# Patient Record
Sex: Female | Born: 1955 | Race: Black or African American | Hispanic: No | Marital: Single | Smoking: Former smoker
Health system: Southern US, Community
[De-identification: ages and names within clinical notes are randomized; demographics above are authoritative.]

## PROBLEM LIST (undated history)

## (undated) DIAGNOSIS — C801 Malignant (primary) neoplasm, unspecified: Secondary | ICD-10-CM

## (undated) DIAGNOSIS — R634 Abnormal weight loss: Secondary | ICD-10-CM

## (undated) DIAGNOSIS — K8689 Other specified diseases of pancreas: Secondary | ICD-10-CM

## (undated) DIAGNOSIS — K219 Gastro-esophageal reflux disease without esophagitis: Secondary | ICD-10-CM

## (undated) DIAGNOSIS — D649 Anemia, unspecified: Secondary | ICD-10-CM

## (undated) DIAGNOSIS — J189 Pneumonia, unspecified organism: Secondary | ICD-10-CM

## (undated) DIAGNOSIS — M810 Age-related osteoporosis without current pathological fracture: Secondary | ICD-10-CM

## (undated) DIAGNOSIS — E559 Vitamin D deficiency, unspecified: Secondary | ICD-10-CM

## (undated) DIAGNOSIS — Z9889 Other specified postprocedural states: Secondary | ICD-10-CM

## (undated) DIAGNOSIS — J449 Chronic obstructive pulmonary disease, unspecified: Secondary | ICD-10-CM

## (undated) DIAGNOSIS — K222 Esophageal obstruction: Secondary | ICD-10-CM

## (undated) DIAGNOSIS — I10 Essential (primary) hypertension: Secondary | ICD-10-CM

## (undated) DIAGNOSIS — D379 Neoplasm of uncertain behavior of digestive organ, unspecified: Secondary | ICD-10-CM

## (undated) DIAGNOSIS — M1990 Unspecified osteoarthritis, unspecified site: Secondary | ICD-10-CM

## (undated) DIAGNOSIS — R112 Nausea with vomiting, unspecified: Secondary | ICD-10-CM

## (undated) DIAGNOSIS — Z808 Family history of malignant neoplasm of other organs or systems: Secondary | ICD-10-CM

## (undated) HISTORY — DX: Anemia, unspecified: D64.9

## (undated) HISTORY — DX: Vitamin D deficiency, unspecified: E55.9

## (undated) HISTORY — PX: BREAST LUMPECTOMY: SHX2

## (undated) HISTORY — DX: Neoplasm of uncertain behavior of digestive organ, unspecified: D37.9

## (undated) HISTORY — PX: ABDOMINAL HYSTERECTOMY: SHX81

## (undated) HISTORY — DX: Other specified diseases of pancreas: K86.89

## (undated) HISTORY — DX: Age-related osteoporosis without current pathological fracture: M81.0

## (undated) HISTORY — DX: Esophageal obstruction: K22.2

## (undated) HISTORY — DX: Gastro-esophageal reflux disease without esophagitis: K21.9

## (undated) HISTORY — DX: Family history of malignant neoplasm of other organs or systems: Z80.8

## (undated) HISTORY — PX: LUNG SURGERY: SHX703

## (undated) HISTORY — DX: Malignant (primary) neoplasm, unspecified: C80.1

---

## 2011-07-16 DIAGNOSIS — C801 Malignant (primary) neoplasm, unspecified: Secondary | ICD-10-CM

## 2011-07-16 HISTORY — DX: Malignant (primary) neoplasm, unspecified: C80.1

## 2011-10-07 ENCOUNTER — Encounter (HOSPITAL_BASED_OUTPATIENT_CLINIC_OR_DEPARTMENT_OTHER): Payer: Self-pay | Admitting: *Deleted

## 2011-10-07 ENCOUNTER — Emergency Department (HOSPITAL_BASED_OUTPATIENT_CLINIC_OR_DEPARTMENT_OTHER)
Admission: EM | Admit: 2011-10-07 | Discharge: 2011-10-07 | Discharge status: Against Medical Advice | Payer: Medicaid Other | Attending: Emergency Medicine | Admitting: Emergency Medicine

## 2011-10-07 ENCOUNTER — Other Ambulatory Visit: Payer: Self-pay

## 2011-10-07 ENCOUNTER — Emergency Department (INDEPENDENT_AMBULATORY_CARE_PROVIDER_SITE_OTHER): Payer: Medicaid Other

## 2011-10-07 DIAGNOSIS — R059 Cough, unspecified: Secondary | ICD-10-CM | POA: Insufficient documentation

## 2011-10-07 DIAGNOSIS — Z043 Encounter for examination and observation following other accident: Secondary | ICD-10-CM

## 2011-10-07 DIAGNOSIS — R531 Weakness: Secondary | ICD-10-CM

## 2011-10-07 DIAGNOSIS — R05 Cough: Secondary | ICD-10-CM | POA: Insufficient documentation

## 2011-10-07 DIAGNOSIS — J449 Chronic obstructive pulmonary disease, unspecified: Secondary | ICD-10-CM | POA: Insufficient documentation

## 2011-10-07 DIAGNOSIS — R197 Diarrhea, unspecified: Secondary | ICD-10-CM | POA: Insufficient documentation

## 2011-10-07 DIAGNOSIS — I10 Essential (primary) hypertension: Secondary | ICD-10-CM | POA: Insufficient documentation

## 2011-10-07 DIAGNOSIS — J4489 Other specified chronic obstructive pulmonary disease: Secondary | ICD-10-CM | POA: Insufficient documentation

## 2011-10-07 DIAGNOSIS — R5381 Other malaise: Secondary | ICD-10-CM | POA: Insufficient documentation

## 2011-10-07 DIAGNOSIS — W19XXXA Unspecified fall, initial encounter: Secondary | ICD-10-CM

## 2011-10-07 DIAGNOSIS — R0602 Shortness of breath: Secondary | ICD-10-CM | POA: Insufficient documentation

## 2011-10-07 DIAGNOSIS — Z9181 History of falling: Secondary | ICD-10-CM | POA: Insufficient documentation

## 2011-10-07 DIAGNOSIS — R1310 Dysphagia, unspecified: Secondary | ICD-10-CM | POA: Insufficient documentation

## 2011-10-07 DIAGNOSIS — R64 Cachexia: Secondary | ICD-10-CM | POA: Insufficient documentation

## 2011-10-07 DIAGNOSIS — R112 Nausea with vomiting, unspecified: Secondary | ICD-10-CM | POA: Insufficient documentation

## 2011-10-07 DIAGNOSIS — R109 Unspecified abdominal pain: Secondary | ICD-10-CM | POA: Insufficient documentation

## 2011-10-07 DIAGNOSIS — M25559 Pain in unspecified hip: Secondary | ICD-10-CM

## 2011-10-07 DIAGNOSIS — M25519 Pain in unspecified shoulder: Secondary | ICD-10-CM

## 2011-10-07 HISTORY — DX: Pneumonia, unspecified organism: J18.9

## 2011-10-07 HISTORY — DX: Chronic obstructive pulmonary disease, unspecified: J44.9

## 2011-10-07 HISTORY — DX: Unspecified osteoarthritis, unspecified site: M19.90

## 2011-10-07 HISTORY — DX: Essential (primary) hypertension: I10

## 2011-10-07 LAB — COMPREHENSIVE METABOLIC PANEL
ALT: 47 U/L — ABNORMAL HIGH (ref 0–35)
AST: 58 U/L — ABNORMAL HIGH (ref 0–37)
Albumin: 2.9 g/dL — ABNORMAL LOW (ref 3.5–5.2)
Alkaline Phosphatase: 122 U/L — ABNORMAL HIGH (ref 39–117)
BUN: 4 mg/dL — ABNORMAL LOW (ref 6–23)
CO2: 31 mEq/L (ref 19–32)
Calcium: 8.6 mg/dL (ref 8.4–10.5)
Chloride: 86 mEq/L — ABNORMAL LOW (ref 96–112)
Creatinine, Ser: 0.4 mg/dL — ABNORMAL LOW (ref 0.50–1.10)
GFR calc Af Amer: >90 mL/min (ref >90)
GFR calc non Af Amer: >90 mL/min (ref >90)
Glucose, Bld: 89 mg/dL (ref 70–99)
Potassium: 3.8 mEq/L (ref 3.5–5.1)
Sodium: 125 mEq/L — ABNORMAL LOW (ref 135–145)
Total Bilirubin: 0.4 mg/dL (ref 0.3–1.2)
Total Protein: 5.5 g/dL — ABNORMAL LOW (ref 6.0–8.3)

## 2011-10-07 LAB — DIFFERENTIAL
Basophils Absolute: 0 10*3/uL (ref 0.0–0.1)
Basophils Relative: 0 % (ref 0–1)
Eosinophils Absolute: 0 10*3/uL (ref 0.0–0.7)
Eosinophils Relative: 0 % (ref 0–5)
Lymphocytes Relative: 26 % (ref 12–46)
Lymphs Abs: 2.1 10*3/uL (ref 0.7–4.0)
Monocytes Absolute: 0.5 10*3/uL (ref 0.1–1.0)
Monocytes Relative: 6 % (ref 3–12)
Neutro Abs: 5.6 10*3/uL (ref 1.7–7.7)
Neutrophils Relative %: 68 % (ref 43–77)

## 2011-10-07 LAB — URINALYSIS, ROUTINE W REFLEX MICROSCOPIC
Bilirubin Urine: NEGATIVE
Glucose, UA: NEGATIVE mg/dL
Hgb urine dipstick: NEGATIVE
Ketones, ur: NEGATIVE mg/dL
Nitrite: NEGATIVE
Protein, ur: NEGATIVE mg/dL
Specific Gravity, Urine: 1.004 — ABNORMAL LOW (ref 1.005–1.030)
Urobilinogen, UA: 1 mg/dL (ref 0.0–1.0)
pH: 6.5 (ref 5.0–8.0)

## 2011-10-07 LAB — MAGNESIUM: Magnesium: 1.6 mg/dL (ref 1.5–2.5)

## 2011-10-07 LAB — CBC
HCT: 43 % (ref 36.0–46.0)
Hemoglobin: 13.9 g/dL (ref 12.0–15.0)
MCH: 33 pg (ref 26.0–34.0)
MCHC: 32.6 g/dL (ref 30.0–36.0)
MCV: 87.4 fL (ref 78.0–100.0)
Platelets: 190 10*3/uL (ref 150–400)
RBC: 4.21 MIL/uL (ref 3.87–5.11)
RDW: 11.2 % — ABNORMAL LOW (ref 11.5–15.5)
WBC: 8.2 10*3/uL (ref 4.0–10.5)

## 2011-10-07 LAB — URINE MICROSCOPIC-ADD ON

## 2011-10-07 LAB — PHOSPHORUS: Phosphorus: 2.7 mg/dL (ref 2.3–4.6)

## 2011-10-07 LAB — LIPASE, BLOOD: Lipase: 44 U/L (ref 11–59)

## 2011-10-07 MED ORDER — SODIUM CHLORIDE 0.9 % IV SOLN
INTRAVENOUS | Status: DC
  Administered 2011-10-07: 20:00:00 via INTRAVENOUS

## 2011-10-07 MED ORDER — ENSURE PO LIQD: 237.0000 mL | Freq: Two times a day (BID) | ORAL | Status: DC

## 2011-10-07 MED ORDER — ONDANSETRON 4 MG PO TBDP: 4.0000 mg | ORAL_TABLET | Freq: Three times a day (TID) | ORAL | Status: AC | PRN

## 2011-10-07 NOTE — ED Notes (Signed)
Patient transported to X-ray via stretcher 

## 2011-10-07 NOTE — ED Provider Notes (Addendum)
I saw and evaluated the patient, reviewed the resident's note and I agree with the findings and plan.   .Face to face Exam:  General:  Awake HEENT:  Atraumatic Resp:  Normal effort Abd:  Nondistended Neuro:No focal weakness Lymph: No adenopathy    Pt signed out against medical advice  Nelia Shi, MD 10/07/11 2308  Nelia Shi, MD 10/07/11 [phone]

## 2011-10-07 NOTE — ED Notes (Signed)
Spoke to patient at length regarding recommendation for admission. Pt states she does not want to be admitted, despite knowing that her health may continue to decline. Spoke to patient regarding test results and low sodium, and pt states she will work on improving her PO intake. Family is concerned bc they feel like pt will not get better if she goes home, and that the patient does not eat much when she is at home "only a few bites, and then she is full". DR Radford Pax made aware. Pt is alert and oriented.

## 2011-10-07 NOTE — ED Notes (Signed)
Secondary Assessment-  Pt comes to ED with c/o generalized weakness. 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  She is unable to eat anything but soup and has lost an excessive amount of weight.  Pt appears frail and emaciated.  Pt expresses she wants to get better and seeking help.

## 2011-10-07 NOTE — ED Notes (Signed)
Pt unable to void 

## 2011-10-07 NOTE — ED Notes (Signed)
Pt returned from radiology. Family at bedside 

## 2011-10-07 NOTE — ED Provider Notes (Addendum)
History     CSN: [id number]  Arrival date & time 10/07/11  1642   First MD Initiated Contact with Patient 10/07/11 1658      Chief Complaint  Patient presents with  . Weakness    (Consider location/radiation/quality/duration/timing/severity/associated sxs/prior treatment) HPI  Patient is 56 yo lady with PMH of HTN, COPD, arthritis, GERD and esophageal stricture, who presents with weakness, chronic diarrhea and abdominal pain, difficulty swallowing, and falls.   Patient reports that she had acid reflux disease years ago and then developed esophageal stricture. She had three esophageal dilation procedures in the past, but still has difficulty in swallowing. She can only take soup or liquid food in past year. She eats 4 to 5 times of small volume liquid food. She never feels full in her stomach. She states that she will have abdominal pain and diarrhea if she eats more food. She also has nausea and vomiting occasionally.   In the past 6 months, she has been feeling very weak, and can not stand or walk steadily. She has had multiple falls the past. Last fall was 3 weeks ago. She injured her left shoulder and left hip. She still has moderate pain in her left shoulder and hip joint.   Patient has history of COPD, but is not taking any medications. Recently, has cough and SOB. She coughs up thick yellow colored sputum without blood in it.  Denies domestic abuse. I asked this possibility when there is no family member is in the room  Denies fever, chills, headaches,  chest pain, dysuria, urgency, frequency, hematuria.   Past Medical History  Diagnosis Date  . Hypertension   . COPD (chronic obstructive pulmonary disease)   . Arthritis   . Pneumonia     Past Surgical History  Procedure Date  . Abdominal hysterectomy   . Cesarean section     No family history on file.  History  Substance Use Topics  . Smoking status: Current Everyday Smoker -- 0.5 packs/day  . Smokeless tobacco:  Not on file  . Alcohol Use: No    OB History    Grav Para Term Preterm Abortions TAB SAB Ect Mult Living                  Review of Systems  Constitutional: Negative for fever and chills.  HENT: Negative for ear pain, congestion, facial swelling, mouth sores, neck pain and voice change.   Eyes: Negative for photophobia, pain and discharge.  Respiratory: Positive for cough and shortness of breath. Negative for apnea, choking, chest tightness, wheezing and stridor.   Cardiovascular: Negative for chest pain, palpitations and leg swelling.  Gastrointestinal: Positive for nausea, vomiting, abdominal pain and diarrhea. Negative for constipation, blood in stool, abdominal distention and anal bleeding.  Genitourinary: Negative for dysuria, urgency, frequency and hematuria.  Musculoskeletal: Positive for back pain, arthralgias and gait problem. Negative for joint swelling.  Skin: Negative for rash and wound.  Neurological: Positive for weakness. Negative for dizziness, tremors, seizures, syncope, facial asymmetry, numbness and headaches.  Hematological: Negative for adenopathy.  Psychiatric/Behavioral: Negative for hallucinations, behavioral problems, confusion and agitation.    Allergies  Review of patient's allergies indicates no known allergies.  Home Medications   Current Outpatient Rx  Name Route Sig Dispense Refill  . SYMBICORT IN Inhalation Inhale 1 puff into the lungs daily as needed.    Marland Kitchen NEXIUM PO Oral Take 1 tablet by mouth daily.    Marland Kitchen BONIVA PO Oral Take 1 tablet  by mouth daily as needed.      BP 84/64  Pulse 84  Temp(Src) 97.5 F (36.4 C) (Oral)  Resp 16  Wt 46 lb 8 oz (21.092 kg)  SpO2 100%  Physical Exam  General: not in acute distress. Extremely cachexia.  HEENT: PERRL, EOMI, no scleral icterus Cardiac: S1/S2, RRR, No murmurs, gallops or rubs Pulm: Decreased air movement bilaterally, scattered expiratory wheezing bilaterally.  No rales, rhonchi or rubs. Abd:  guarding diffusely, tender diffusely, no rebound pain, no organomegaly, BS present Ext: No rashes or edema, 2+DP/PT pulse bilaterally. Tender over left shoulder and hip joinst. There is limitation of movement to extension in both left shoulder and hip joints. Neuro: alert and oriented X3, cranial nerves II-XII grossly intact, muscle strength 5/5 in all extremeties,  sensation to light touch intact.    Date: 10/07/2011  Rate: 95  Rhythm: normal sinus rhythm  QRS Axis: right  Intervals: normal  ST/T Wave abnormalities: nonspecific ST changes  Conduction Disutrbances:none  Narrative Interpretation:   Old EKG Reviewed: none available  ED Course  Procedures (including critical care time)  Patient's CXR showed mild peribronchial thickening suggesting COPD.  She has negative X-ray for shoulder and hip joints, normal lipase, no leukocytosis. Her blood phosphorus and magnesium levels are normal. Her UA showed moderate leukocytes and few bacteria with negative nitrite. Since patient does not have symptoms for UTI, so will not treat now. Her CMP shows hyponatremia with Na 125. Patient is advised to be admitted to hospital. But patient refused to stay and would like to go home today. Patient is instructed to come back if her symptoms get worse.   Labs Reviewed  CBC - Abnormal; Notable for the following:    RDW 11.2 (*)    All other components within normal limits  COMPREHENSIVE METABOLIC PANEL - Abnormal; Notable for the following:    Sodium 125 (*)    Chloride 86 (*)    BUN 4 (*)    Creatinine, Ser 0.40 (*)    Total Protein 5.5 (*)    Albumin 2.9 (*)    AST 58 (*)    ALT 47 (*)    Alkaline Phosphatase 122 (*)    All other components within normal limits  URINALYSIS, ROUTINE W REFLEX MICROSCOPIC - Abnormal; Notable for the following:    Specific Gravity, Urine 1.004 (*)    Leukocytes, UA MODERATE (*)    All other components within normal limits  URINE MICROSCOPIC-ADD ON - Abnormal; Notable  for the following:    Squamous Epithelial / LPF FEW (*)    Bacteria, UA FEW (*)    All other components within normal limits  PHOSPHORUS  MAGNESIUM  LIPASE, BLOOD  DIFFERENTIAL  DIFFERENTIAL   Dg Hip Complete Left  10/07/2011  *RADIOLOGY REPORT*  Clinical Data: Larey Seat 3 weeks ago.  Pain in the left shoulder and left hip.  No prior injury.  Frequent falls.  LEFT HIP - COMPLETE 2+ VIEW  Comparison: None.  Findings: AP and lateral views of the left hip include AP view of the pelvis.  No evidence for acute fracture or subluxation. Regional bowel gas pattern is nonobstructive.  Surgical clips overlie the right SI joint.  IMPRESSION: No evidence for acute  abnormality.  Original Report Authenticated By: Patterson Hammersmith, M.D.   Dg Shoulder Left  10/07/2011  *RADIOLOGY REPORT*  Clinical Data: Pain.  Fall 3 weeks ago.  Frequent falls.  LEFT SHOULDER - 2+ VIEW  Comparison: None.  Findings: There is no evidence for acute fracture or dislocation. No soft tissue foreign body or gas identified.  Left lung apex is unremarkable in appearance.  IMPRESSION: Negative exam.  Original Report Authenticated By: Patterson Hammersmith, M.D.     No diagnosis found.    MDM        Lorretta Harp, MD 10/07/11 [phone]

## 2011-10-07 NOTE — Discharge Instructions (Signed)
1. Please take Zofran for nausea.  2. If you have worsening of your symptoms or new symptoms arise, please go to the ER immediately if symptoms are severe.  Fatigue Fatigue is a feeling of tiredness, lack of energy, lack of motivation, or feeling tired all the time. Having enough rest, good nutrition, and reducing stress will normally reduce fatigue. Consult your caregiver if it persists. The nature of your fatigue will help your caregiver to find out its cause. The treatment is based on the cause.  CAUSES  There are many causes for fatigue. Most of the time, fatigue can be traced to one or more of your habits or routines. Most causes fit into one or more of three general areas. They are: Lifestyle problems  Sleep disturbances.   Overwork.   Physical exertion.   Unhealthy habits.   Poor eating habits or eating disorders.   Alcohol and/or drug use .   Lack of proper nutrition (malnutrition).  Psychological problems  Stress and/or anxiety problems.   Depression.   Grief.   Boredom.  Medical Problems or Conditions  Anemia.   Pregnancy.   Thyroid gland problems.   Recovery from major surgery.   Continuous pain.   Emphysema or asthma that is not well controlled   Allergic conditions.   Diabetes.   Infections (such as mononucleosis).   Obesity.   Sleep disorders, such as sleep apnea.   Heart failure or other heart-related problems.   Cancer.   Kidney disease.   Liver disease.   Effects of certain medicines such as antihistamines, cough and cold remedies, prescription pain medicines, heart and blood pressure medicines, drugs used for treatment of cancer, and some antidepressants.  SYMPTOMS  The symptoms of fatigue include:   Lack of energy.   Lack of drive (motivation).   Drowsiness.   Feeling of indifference to the surroundings.  DIAGNOSIS  The details of how you feel help guide your caregiver in finding out what is causing the fatigue. You will be  asked about your present and past health condition. It is important to review all medicines that you take, including prescription and non-prescription items. A thorough exam will be done. You will be questioned about your feelings, habits, and normal lifestyle. Your caregiver may suggest blood tests, urine tests, or other tests to look for common medical causes of fatigue.  TREATMENT  Fatigue is treated by correcting the underlying cause. For example, if you have continuous pain or depression, treating these causes will improve how you feel. Similarly, adjusting the dose of certain medicines will help in reducing fatigue.  HOME CARE INSTRUCTIONS   Try to get the required amount of good sleep every night.   Eat a healthy and nutritious diet, and drink enough water throughout the day.   Practice ways of relaxing (including yoga or meditation).   Exercise regularly.   Make plans to change situations that cause stress. Act on those plans so that stresses decrease over time. Keep your work and personal routine reasonable.   Avoid street drugs and minimize use of alcohol.   Start taking a daily multivitamin after consulting your caregiver.  SEEK MEDICAL CARE IF:   You have persistent tiredness, which cannot be accounted for.   You have fever.   You have unintentional weight loss.   You have headaches.   You have disturbed sleep throughout the night.   You are feeling sad.   You have constipation.   You have dry skin.   You  have gained weight.   You are taking any new or different medicines that you suspect are causing fatigue.   You are unable to sleep at night.   You develop any unusual swelling of your legs or other parts of your body.  SEEK IMMEDIATE MEDICAL CARE IF:   You are feeling confused.   Your vision is blurred.   You feel faint or pass out.   You develop severe headache.   You develop severe abdominal, pelvic, or back pain.   You develop chest pain,  shortness of breath, or an irregular or fast heartbeat.   You are unable to pass a normal amount of urine.   You develop abnormal bleeding such as bleeding from the rectum or you vomit blood.   You have thoughts about harming yourself or committing suicide.   You are worried that you might harm someone else.  MAKE SURE YOU:   Understand these instructions.   Will watch your condition.   Will get help right away if you are not doing well or get worse.  Document Released: 04/28/2007 Document Revised: 06/20/2011 Document Reviewed: 04/28/2007 [facility] Patient Information 2012 [place], [place].

## 2011-10-07 NOTE — ED Notes (Signed)
MD at bedside. 

## 2011-10-07 NOTE — ED Notes (Signed)
Weakness diarrhea vomiting. Has been sick for 6 months but she will not go to the doctor.

## 2011-10-07 NOTE — ED Notes (Signed)
Pt signed out AMA. Pt states she understands the risks of no being admitted for further treatment. Pt states she assumes those risks, and will return for worsening symptoms. Pt [facility]

## 2011-10-07 NOTE — ED Notes (Signed)
PO fluids provided. 

## 2011-10-19 ENCOUNTER — Other Ambulatory Visit: Payer: Self-pay

## 2011-10-19 ENCOUNTER — Emergency Department (INDEPENDENT_AMBULATORY_CARE_PROVIDER_SITE_OTHER): Payer: Self-pay

## 2011-10-19 ENCOUNTER — Inpatient Hospital Stay (HOSPITAL_BASED_OUTPATIENT_CLINIC_OR_DEPARTMENT_OTHER)
Admission: EM | Admit: 2011-10-19 | Discharge: 2011-10-22 | DRG: 640 | Disposition: A | Discharge status: Home / Self Care | Payer: Self-pay | Attending: Internal Medicine | Admitting: Internal Medicine

## 2011-10-19 ENCOUNTER — Encounter (HOSPITAL_BASED_OUTPATIENT_CLINIC_OR_DEPARTMENT_OTHER): Payer: Self-pay | Admitting: Emergency Medicine

## 2011-10-19 DIAGNOSIS — K2970 Gastritis, unspecified, without bleeding: Secondary | ICD-10-CM | POA: Diagnosis present

## 2011-10-19 DIAGNOSIS — R5381 Other malaise: Secondary | ICD-10-CM | POA: Diagnosis present

## 2011-10-19 DIAGNOSIS — K2990 Gastroduodenitis, unspecified, without bleeding: Secondary | ICD-10-CM | POA: Diagnosis present

## 2011-10-19 DIAGNOSIS — J4489 Other specified chronic obstructive pulmonary disease: Secondary | ICD-10-CM | POA: Diagnosis present

## 2011-10-19 DIAGNOSIS — R0602 Shortness of breath: Secondary | ICD-10-CM

## 2011-10-19 DIAGNOSIS — J449 Chronic obstructive pulmonary disease, unspecified: Secondary | ICD-10-CM | POA: Diagnosis present

## 2011-10-19 DIAGNOSIS — Z79899 Other long term (current) drug therapy: Secondary | ICD-10-CM

## 2011-10-19 DIAGNOSIS — E46 Unspecified protein-calorie malnutrition: Secondary | ICD-10-CM

## 2011-10-19 DIAGNOSIS — R1310 Dysphagia, unspecified: Secondary | ICD-10-CM | POA: Diagnosis present

## 2011-10-19 DIAGNOSIS — R627 Adult failure to thrive: Secondary | ICD-10-CM

## 2011-10-19 DIAGNOSIS — R531 Weakness: Secondary | ICD-10-CM | POA: Diagnosis present

## 2011-10-19 DIAGNOSIS — R634 Abnormal weight loss: Secondary | ICD-10-CM | POA: Diagnosis present

## 2011-10-19 DIAGNOSIS — Z681 Body mass index (BMI) 19 or less, adult: Secondary | ICD-10-CM

## 2011-10-19 DIAGNOSIS — M7989 Other specified soft tissue disorders: Secondary | ICD-10-CM

## 2011-10-19 DIAGNOSIS — K209 Esophagitis, unspecified without bleeding: Secondary | ICD-10-CM | POA: Diagnosis present

## 2011-10-19 DIAGNOSIS — E876 Hypokalemia: Principal | ICD-10-CM | POA: Diagnosis present

## 2011-10-19 DIAGNOSIS — M25579 Pain in unspecified ankle and joints of unspecified foot: Secondary | ICD-10-CM | POA: Diagnosis present

## 2011-10-19 DIAGNOSIS — E8809 Other disorders of plasma-protein metabolism, not elsewhere classified: Secondary | ICD-10-CM | POA: Diagnosis present

## 2011-10-19 DIAGNOSIS — E43 Unspecified severe protein-calorie malnutrition: Secondary | ICD-10-CM | POA: Diagnosis present

## 2011-10-19 DIAGNOSIS — K000 Anodontia: Secondary | ICD-10-CM | POA: Diagnosis present

## 2011-10-19 DIAGNOSIS — M129 Arthropathy, unspecified: Secondary | ICD-10-CM | POA: Diagnosis present

## 2011-10-19 HISTORY — DX: Abnormal weight loss: R63.4

## 2011-10-19 LAB — URINALYSIS, ROUTINE W REFLEX MICROSCOPIC
Bilirubin Urine: NEGATIVE
Glucose, UA: NEGATIVE mg/dL
Hgb urine dipstick: NEGATIVE
Ketones, ur: NEGATIVE mg/dL
Nitrite: NEGATIVE
Protein, ur: NEGATIVE mg/dL
Specific Gravity, Urine: 1.004 — ABNORMAL LOW (ref 1.005–1.030)
Urobilinogen, UA: 0.2 mg/dL (ref 0.0–1.0)
pH: 6 (ref 5.0–8.0)

## 2011-10-19 LAB — DIFFERENTIAL
Basophils Absolute: 0 10*3/uL (ref 0.0–0.1)
Basophils Relative: 0 % (ref 0–1)
Eosinophils Absolute: 0 10*3/uL (ref 0.0–0.7)
Eosinophils Relative: 0 % (ref 0–5)
Lymphocytes Relative: 27 % (ref 12–46)
Lymphs Abs: 2.3 10*3/uL (ref 0.7–4.0)
Monocytes Absolute: 0.8 10*3/uL (ref 0.1–1.0)
Monocytes Relative: 10 % (ref 3–12)
Neutro Abs: 5.5 10*3/uL (ref 1.7–7.7)
Neutrophils Relative %: 63 % (ref 43–77)

## 2011-10-19 LAB — COMPREHENSIVE METABOLIC PANEL
ALT: 23 U/L (ref 0–35)
AST: 29 U/L (ref 0–37)
Albumin: 2.1 g/dL — ABNORMAL LOW (ref 3.5–5.2)
Alkaline Phosphatase: 109 U/L (ref 39–117)
BUN: <3 mg/dL — ABNORMAL LOW (ref 6–23)
CO2: 36 mEq/L — ABNORMAL HIGH (ref 19–32)
Calcium: 7.6 mg/dL — ABNORMAL LOW (ref 8.4–10.5)
Chloride: 97 mEq/L (ref 96–112)
Creatinine, Ser: 0.4 mg/dL — ABNORMAL LOW (ref 0.50–1.10)
GFR calc Af Amer: >90 mL/min (ref >90)
GFR calc non Af Amer: >90 mL/min (ref >90)
Glucose, Bld: 74 mg/dL (ref 70–99)
Potassium: 2 mEq/L — CL (ref 3.5–5.1)
Sodium: 139 mEq/L (ref 135–145)
Total Bilirubin: 0.5 mg/dL (ref 0.3–1.2)
Total Protein: 4.5 g/dL — ABNORMAL LOW (ref 6.0–8.3)

## 2011-10-19 LAB — URINE MICROSCOPIC-ADD ON

## 2011-10-19 LAB — CBC
HCT: 36.5 % (ref 36.0–46.0)
Hemoglobin: 13.5 g/dL (ref 12.0–15.0)
MCH: 32.5 pg (ref 26.0–34.0)
MCHC: 37 g/dL — ABNORMAL HIGH (ref 30.0–36.0)
MCV: 87.7 fL (ref 78.0–100.0)
Platelets: 230 10*3/uL (ref 150–400)
RBC: 4.16 MIL/uL (ref 3.87–5.11)
RDW: 11.9 % (ref 11.5–15.5)
WBC: 8.7 10*3/uL (ref 4.0–10.5)

## 2011-10-19 LAB — RAPID URINE DRUG SCREEN, HOSP PERFORMED
Amphetamines: NOT DETECTED
Barbiturates: NOT DETECTED
Benzodiazepines: NOT DETECTED
Cocaine: NOT DETECTED
Opiates: NOT DETECTED
Tetrahydrocannabinol: NOT DETECTED

## 2011-10-19 LAB — MAGNESIUM: Magnesium: 1.5 mg/dL (ref 1.5–2.5)

## 2011-10-19 LAB — LIPASE, BLOOD: Lipase: 9 U/L — ABNORMAL LOW (ref 11–59)

## 2011-10-19 MED ORDER — POTASSIUM CHLORIDE 20 MEQ/15ML (10%) PO LIQD
ORAL | Status: AC
  Administered 2011-10-19: 40 meq via ORAL
  Filled 2011-10-19: qty 30

## 2011-10-19 MED ORDER — PANTOPRAZOLE SODIUM 40 MG PO TBEC
40.0000 mg | DELAYED_RELEASE_TABLET | Freq: Every day | ORAL | Status: DC
  Administered 2011-10-19 – 2011-10-20 (×2): 40 mg via ORAL
  Filled 2011-10-19 (×3): qty 1

## 2011-10-19 MED ORDER — ACETAMINOPHEN 650 MG RE SUPP: 650.0000 mg | Freq: Four times a day (QID) | RECTAL | Status: DC | PRN

## 2011-10-19 MED ORDER — POTASSIUM CHLORIDE CRYS ER 20 MEQ PO TBCR
60.0000 meq | EXTENDED_RELEASE_TABLET | Freq: Three times a day (TID) | ORAL | Status: AC
  Administered 2011-10-19 – 2011-10-20 (×2): 60 meq via ORAL
  Filled 2011-10-19: qty 3

## 2011-10-19 MED ORDER — HYDROCODONE-ACETAMINOPHEN 5-325 MG PO TABS
1.0000 | ORAL_TABLET | ORAL | Status: DC | PRN
  Administered 2011-10-19: 2 via ORAL
  Filled 2011-10-19: qty 2

## 2011-10-19 MED ORDER — ENSURE COMPLETE PO LIQD
237.0000 mL | Freq: Three times a day (TID) | ORAL | Status: DC
  Administered 2011-10-19 – 2011-10-21 (×4): 237 mL via ORAL

## 2011-10-19 MED ORDER — ENSURE PO LIQD: 237.0000 mL | Freq: Three times a day (TID) | ORAL | Status: DC

## 2011-10-19 MED ORDER — ACETAMINOPHEN 325 MG PO TABS: 650.0000 mg | ORAL_TABLET | Freq: Four times a day (QID) | ORAL | Status: DC | PRN

## 2011-10-19 MED ORDER — MORPHINE SULFATE 2 MG/ML IJ SOLN
0.5000 mg | INTRAMUSCULAR | Status: DC | PRN
  Administered 2011-10-20: 1 mg via INTRAVENOUS
  Administered 2011-10-20: 0.25 mg via INTRAVENOUS
  Administered 2011-10-20: 1 mg via INTRAVENOUS
  Administered 2011-10-20: 0.5 mg via INTRAVENOUS
  Administered 2011-10-20 – 2011-10-22 (×11): 1 mg via INTRAVENOUS
  Filled 2011-10-19 (×14): qty 1

## 2011-10-19 MED ORDER — SODIUM CHLORIDE 0.9 % IV SOLN
INTRAVENOUS | Status: DC
  Administered 2011-10-19 – 2011-10-21 (×5): via INTRAVENOUS

## 2011-10-19 MED ORDER — ONDANSETRON HCL 4 MG PO TABS: 4.0000 mg | ORAL_TABLET | Freq: Four times a day (QID) | ORAL | Status: DC | PRN

## 2011-10-19 MED ORDER — BUDESONIDE-FORMOTEROL FUMARATE 160-4.5 MCG/ACT IN AERO
1.0000 | INHALATION_SPRAY | Freq: Two times a day (BID) | RESPIRATORY_TRACT | Status: DC
  Administered 2011-10-20 – 2011-10-22 (×3): 1 via RESPIRATORY_TRACT
  Filled 2011-10-19 (×4): qty 6

## 2011-10-19 MED ORDER — POTASSIUM CHLORIDE 10 MEQ/100ML IV SOLN
10.0000 meq | INTRAVENOUS | Status: DC
  Administered 2011-10-19: 10 meq via INTRAVENOUS
  Filled 2011-10-19: qty 100

## 2011-10-19 MED ORDER — SODIUM CHLORIDE 0.9 % IV SOLN: INTRAVENOUS | Status: DC

## 2011-10-19 MED ORDER — POTASSIUM CHLORIDE 20 MEQ/15ML (10%) PO LIQD
40.0000 meq | Freq: Once | ORAL | Status: AC
  Administered 2011-10-19: 40 meq via ORAL

## 2011-10-19 MED ORDER — ONDANSETRON HCL 4 MG/2ML IJ SOLN: 4.0000 mg | Freq: Four times a day (QID) | INTRAMUSCULAR | Status: DC | PRN

## 2011-10-19 MED ORDER — ENOXAPARIN SODIUM 40 MG/0.4ML ~~LOC~~ SOLN
40.0000 mg | SUBCUTANEOUS | Status: DC
  Administered 2011-10-19: 40 mg via SUBCUTANEOUS
  Filled 2011-10-19 (×2): qty 0.4

## 2011-10-19 MED ORDER — POTASSIUM CHLORIDE CRYS ER 20 MEQ PO TBCR
EXTENDED_RELEASE_TABLET | ORAL | Status: AC
  Filled 2011-10-19: qty 3

## 2011-10-19 MED ORDER — POTASSIUM CHLORIDE 20 MEQ PO PACK
40.0000 meq | PACK | Freq: Once | ORAL | Status: DC
  Filled 2011-10-19: qty 2

## 2011-10-19 NOTE — ED Notes (Signed)
[facility] had been called for transfer to [facility]

## 2011-10-19 NOTE — ED Notes (Signed)
Report to Ginger, RN on [facility]

## 2011-10-19 NOTE — ED Notes (Signed)
Pt having some swelling in ankles for a few days.  Pt having some painful ambulation.  Pt continues to have poor appetite.  Pt has unsteady gait.  Noted slight swelling at ankles.  Some dyspnea on exertion.

## 2011-10-19 NOTE — ED Provider Notes (Addendum)
History     CSN: [id number]  Arrival date & time 10/19/11  0930   First MD Initiated Contact with Patient 10/19/11 [phone]      Chief Complaint  Patient presents with  . Leg Swelling  . Shortness of Breath  . Failure To Thrive    HPI The patient presents to the emergency room primarily complaining of foot swelling and tingling. Patient states the symptoms started a few days ago but her family member states she has had this off-and-on for at least several months. Patient states it was worse last night but she has noticed that it improved this morning. The swelling is just mild. The tingling and burning was more severe. Patient also has been having trouble with weight loss over at least the last year. Patient weighs only 57 pounds at this time. She has not been able to eat or drink well she states over the last year. The patient attributes this to having her teeth pulled. The patient has been trying to drink Ensure but she vomits when she drinks that. She has not had any fevers or coughing. She does smoke and intermittently has had shortness of breath. She denies any fevers, chest pain, abdominal pain. She has not been able to see a primary doctor in a while. [facility] this [facility]. Past Medical History  Diagnosis Date  . Hypertension   . COPD (chronic obstructive pulmonary disease)   . Arthritis   . Pneumonia   . Weight loss, unintentional     Past Surgical History  Procedure Date  . Abdominal hysterectomy   . Cesarean section     History reviewed. No pertinent family history.  History  Substance Use Topics  . Smoking status: Current Everyday Smoker -- 0.5 packs/day  . Smokeless tobacco: Not on file  . Alcohol Use: No    OB History    Grav Para Term Preterm Abortions TAB SAB Ect Mult Living                  Review of Systems  Constitutional: Positive for fatigue. Negative for fever.  Cardiovascular: Positive for leg  swelling. Negative for chest pain.  Genitourinary: Negative for dysuria.  Neurological: Positive for weakness. Negative for headaches.  Hematological: Does not bruise/bleed easily.  Psychiatric/Behavioral: Negative for confusion.  All other systems reviewed and are negative.    Allergies  Review of patient's allergies indicates no known allergies.  Home Medications   Current Outpatient Rx  Name Route Sig Dispense Refill  . SYMBICORT IN Inhalation Inhale 1 puff into the lungs daily as needed.    . ENSURE PO LIQD Oral Take 237 mLs by mouth 2 (two) times daily between meals. 237 mL 12  . NEXIUM PO Oral Take 1 tablet by mouth daily.    Marland Kitchen BONIVA PO Oral Take 1 tablet by mouth daily as needed.      BP 113/74  Pulse 90  Temp(Src) 98.3 F (36.8 C) (Oral)  Resp 16  Wt 57 lb 3 oz (25.94 kg)  SpO2 100%  Physical Exam  Nursing note and vitals reviewed. Constitutional: No distress.       Appear significantly malnourished  HENT:  Head: Normocephalic and atraumatic.  Right Ear: External ear normal.  Left Ear: External ear normal.  Eyes: Conjunctivae are normal. Right eye exhibits no discharge. Left eye exhibits no discharge. No scleral icterus.  Neck: Neck supple. No tracheal deviation present.  Cardiovascular: Normal rate, regular rhythm and intact distal pulses.   Pulmonary/Chest: Effort normal and breath sounds normal. No stridor. No respiratory distress. She has no wheezes. She has no rales.  Abdominal: Soft. Bowel sounds are normal. She exhibits no distension. There is no tenderness. There is no rebound and no guarding.  Musculoskeletal: She exhibits no edema and no tenderness.       Mild trace edema bilateral feet, strong dorsalis pedis pulse bilaterally  Neurological: She is alert. She displays atrophy. No sensory deficit. Cranial nerve deficit:  no gross defecits noted. She exhibits normal muscle tone. She displays no seizure activity. Coordination normal.  Skin: Skin is warm  and dry. No rash noted.  Psychiatric: She has a normal mood and affect.    ED Course  Procedures (including critical care time)  Medications  potassium chloride (KLOR-CON) packet 40 mEq (not administered)  potassium chloride 10 mEq in 100 mL IVPB (not administered)    Date: 10/19/2011  Rate: 81  Rhythm: normal sinus rhythm  QRS Axis: right  Intervals: normal  ST/T Wave abnormalities: normal  Conduction Disutrbances:none  Narrative Interpretation:   Old EKG Reviewed: unchanged   Labs Reviewed  CBC - Abnormal; Notable for the following:    MCHC 37.0 (*) CORRECTED FOR COLD AGGLUTININS   All other components within normal limits  URINALYSIS, ROUTINE W REFLEX MICROSCOPIC - Abnormal; Notable for the following:    Specific Gravity, Urine 1.004 (*)    Leukocytes, UA MODERATE (*)    All other components within normal limits  COMPREHENSIVE METABOLIC PANEL - Abnormal; Notable for the following:    Potassium 2.0 (*)    CO2 36 (*)    BUN <3 (*) REPEATED TO VERIFY   Creatinine, Ser 0.40 (*)    Calcium 7.6 (*)    Total Protein 4.5 (*)    Albumin 2.1 (*)    All other components within normal limits  LIPASE, BLOOD - Abnormal; Notable for the following:    Lipase 9 (*)    All other components within normal limits  URINE MICROSCOPIC-ADD ON - Abnormal; Notable for the following:    Squamous Epithelial / LPF MANY (*)    Bacteria, UA MANY (*)    All other components within normal limits  DIFFERENTIAL   Dg Chest 2 View  10/19/2011  *RADIOLOGY REPORT*  Clinical Data: Shortness of breath  CHEST - 2 VIEW  Comparison: 10/07/2011  Findings: Cardiomediastinal silhouette is stable.  Hyperinflation again noted.  No acute infiltrate or pleural effusion.  No pulmonary edema.  Bony thorax is stable.  Bilateral nodular nipple shadow again noted.  IMPRESSION: Stable COPD.  No active disease.  Original Report Authenticated By: Natasha Mead, M.D.     MDM  Patient has had significant weight loss over the  last year.  She has not had good followup with an outpatient primary care Dr. Marlou Porch to the symptoms remain unclear. With her smoking history and certainly concerned about the possibility of some undefined malignancy. Her lab test today shows significant malnutrition with diminished protein levels. She also has significant hypokalemia.  I suspect she might have a chronic metabolic alkalosis possibly related to COPD. The patient has been given IV potassium and oral potassium. I will plan on getting her admitted to the hospital for further evaluation.        Celene Kras, MD 10/19/11 1256  Celene Kras, MD 10/19/11 1318

## 2011-10-19 NOTE — H&P (Signed)
Hospital Admission Note Date: 10/19/2011  Patient name: Claudia Wiley           Medical record number: [id number] Date of birth: 1956-04-27           Age: 56 y.o.   Gender: female    PCP:   No primary provider on file.   Chief Complaint:  Hypokalemia  HPI: Claudia Wiley is a 56 y.o. female with past medical history of esophageal stricture, COPD and arthritis. Patient came in to the hospital complaining about tingling in her legs. Patient initially said she had some swelling in her lower extremity which is gone now, but she continues to have tingling and abnormal sensation in her both legs. She also have generalized weakness apart from that she did not mention any other complaints. Upon initial evaluation in the emergency department patient was found to be severely monitor patient's, patient has lost over 40 pounds in the last year, she has hypoalbuminemia and severe hypokalemia. Patient attributed her weight loss to 2 factors including inability to swallow solids and difficulty with chewing food because he does not have any teeth or dentures. Patient admitted to the hospital for the severe hypokalemia and to further workup the significant weight loss.  Past Medical History: Past Medical History  Diagnosis Date  . Hypertension   . COPD (chronic obstructive pulmonary disease)   . Arthritis   . Pneumonia   . Weight loss, unintentional    Past Surgical History  Procedure Date  . Abdominal hysterectomy   . Cesarean section     Medications: Prior to Admission medications   Medication Sig Start Date End Date Taking? Authorizing Provider  budesonide-formoterol (SYMBICORT) 160-4.5 MCG/ACT inhaler Inhale 1 puff into the lungs daily as needed.   Yes Historical Provider, MD  ENSURE (ENSURE) Take 237 mLs by mouth 2 (two) times daily between meals. 10/07/11 10/06/12 Yes Lorretta Harp, MD  esomeprazole (NEXIUM) 40 MG capsule Take 40 mg by mouth daily before breakfast.   Yes Historical Provider, MD    ibandronate (BONIVA) 150 MG tablet Take 150 mg by mouth every 30 (thirty) days. Take in the morning with a full glass of water, on an empty stomach, and do not take anything else by mouth or lie down for the next 30 min.   Yes Historical Provider, MD    Allergies:  No Known Allergies  Social History:  reports that she has been smoking Cigarettes.  She has a .5 pack-year smoking history. She has never used smokeless tobacco. She reports that she does not drink alcohol or use illicit drugs.  Family History: History reviewed. No pertinent family history.  Review of Systems:  Constitutional: negative for anorexia, fevers and sweats Eyes: negative for irritation, redness and visual disturbance Ears, nose, mouth, throat, and face: negative for earaches, epistaxis, nasal congestion and sore throat Respiratory: negative for cough, dyspnea on exertion, sputum and wheezing Cardiovascular: negative for chest pain, dyspnea, lower extremity edema, orthopnea, palpitations and syncope Gastrointestinal: negative for abdominal pain, constipation, diarrhea, melena, nausea and vomiting Genitourinary:negative for dysuria, frequency and hematuria Hematologic/lymphatic: negative for bleeding, easy bruising and lymphadenopathy Musculoskeletal:negative for arthralgias, muscle weakness and stiff joints Neurological: negative for coordination problems, gait problems, headaches and weakness Endocrine: negative for diabetic symptoms including polydipsia, polyuria and weight loss Allergic/Immunologic: negative for anaphylaxis, hay fever and urticaria  Physical Exam: BP 88/71  Pulse 84  Temp(Src) 98.7 F (37.1 C) (Oral)  Resp 18  Ht 4\' 10"  (1.473 m)  Wt 26.6 kg (  58 lb 10.3 oz)  BMI 12.26 kg/m2  SpO2 100% General appearance: alert, cooperative and no distress , emaciated, cachectic Head: Normocephalic, without obvious abnormality, atraumatic  Eyes: conjunctivae/corneas clear. PERRL, EOM's intact. Fundi  benign.  Nose: Nares normal. Septum midline. Mucosa normal. No drainage or sinus tenderness.  Throat: lips, mucosa, and tongue normal; teeth and gums normal  Neck: Supple, no masses, no cervical lymphadenopathy, no JVD appreciated, no meningeal signs Resp: clear to auscultation bilaterally  Chest wall: no tenderness  Cardio: regular rate and rhythm, S1, S2 normal, no murmur, click, rub or gallop  GI: soft, non-tender; bowel sounds normal; no masses, no organomegaly  Extremities: extremities normal, atraumatic, no cyanosis or edema  Skin: Skin color, texture, turgor normal. No rashes or lesions  Neurologic: Alert and oriented X 3, normal strength and tone. Normal symmetric reflexes. Normal coordination and gait  Labs on Admission:   Basename 10/19/11 1027 10/19/11 1022  NA 139 --  K 2.0* --  CL 97 --  CO2 36* --  GLUCOSE 74 --  BUN <3* --  CREATININE 0.40* --  CALCIUM 7.6* --  MG -- 1.5  PHOS -- --    Basename 10/19/11 1027  AST 29  ALT 23  ALKPHOS 109  BILITOT 0.5  PROT 4.5*  ALBUMIN 2.1*    Basename 10/19/11 1027  LIPASE 9*  AMYLASE --    Basename 10/19/11 1015  WBC 8.7  NEUTROABS 5.5  HGB 13.5  HCT 36.5  MCV 87.7  PLT 230   Radiological Exams on Admission: Dg Chest 2 View  10/19/2011  *RADIOLOGY REPORT*  Clinical Data: Shortness of breath  CHEST - 2 VIEW  Comparison: 10/07/2011  Findings: Cardiomediastinal silhouette is stable.  Hyperinflation again noted.  No acute infiltrate or pleural effusion.  No pulmonary edema.  Bony thorax is stable.  Bilateral nodular nipple shadow again noted.  IMPRESSION: Stable COPD.  No active disease.  Original Report Authenticated By: Natasha Mead, M.D.   Dg Hip Complete Left  10/07/2011  *RADIOLOGY REPORT*  Clinical Data: Larey Seat 3 weeks ago.  Pain in the left shoulder and left hip.  No prior injury.  Frequent falls.  LEFT HIP - COMPLETE 2+ VIEW  Comparison: None.  Findings: AP and lateral views of the left hip include AP view of the  pelvis.  No evidence for acute fracture or subluxation. Regional bowel gas pattern is nonobstructive.  Surgical clips overlie the right SI joint.  IMPRESSION: No evidence for acute  abnormality.  Original Report Authenticated By: Patterson Hammersmith, M.D.   Dg Chest Port 1 View  10/07/2011  *RADIOLOGY REPORT*  Clinical Data: Cough.  PORTABLE CHEST - 1 VIEW  Comparison: None.  Findings: The lungs are hyperaerated; this raises concern for COPD. Mild peribronchial thickening is noted.  There is no evidence of focal opacification, pleural effusion or pneumothorax.  Bilateral nipple shadows are noted.  The cardiomediastinal silhouette is within normal limits.  No acute osseous abnormalities are seen.  IMPRESSION: Mild peribronchial thickening noted; findings likely reflect COPD. No focal consolidation seen.  Original Report Authenticated By: Tonia Ghent, M.D.   Dg Shoulder Left  10/07/2011  *RADIOLOGY REPORT*  Clinical Data: Pain.  Fall 3 weeks ago.  Frequent falls.  LEFT SHOULDER - 2+ VIEW  Comparison: None.  Findings: There is no evidence for acute fracture or dislocation. No soft tissue foreign body or gas identified.  Left lung apex is unremarkable in appearance.  IMPRESSION: Negative exam.  Original Report Authenticated  By: Patterson Hammersmith, M.D.     IMPRESSION: Present on Admission:  .Hypokalemia .Malnutrition .Weight loss .Generalized weakness .Dysphagia .Anodontia  Assessment/Plan  Hypokalemia This is could be secondary to her decreased oral intake, this will be aggressively replaced by IV and oral supplementation. We'll check potassium level in the morning. I'll check also magnesium level.  Weight loss/malnutrition Has significant weight loss, patient used to weigh about 100 pounds since last year she lost about 40 pounds. Patient had problems before with esophageal stricture and she reported balloon dilatations twice. She is taken PPI for GERD. I will obtain esophagogram. And obtain  the records from [facility]. Will check HIV, diabetes and her thyroid function. If these are normal on my check for malabsorption  Generalized weakness This is likely secondary to hypokalemia and malnutrition.  Dysphagia Patient has problems even with soft food, I will obtain esophagogram.  Emmaleigh Longo A 10/19/2011, 5:45 PM

## 2011-10-19 NOTE — ED Notes (Signed)
Attempted to call report to [phone], nurse unavailable , will call me back

## 2011-10-19 NOTE — Progress Notes (Signed)
Claudia Wiley [id number] Admission Data: 10/19/2011 3:45 PM Attending Provider: Antonieta Pert, MD  PCP:No primary provider on file. Consults/ Treatment Team:    Claudia Wiley is a 56 y.o. female patient admitted from ED awake, alert  & orientated  X 3,  No Order, VSS - Blood pressure 88/71, pulse 84, temperature 98.7 F (37.1 C), temperature source Oral, resp. rate 18, height 4\' 10"  (1.473 m), weight 26.6 kg (58 lb 10.3 oz), SpO2 100.00%., no c/o shortness of breath, no c/o chest pain, no distress noted. Tele # [id number] placed and pt is currently running:normal sinus rhythm.   IV site WDL:  upper arm right, condition patent and no redness with a transparent dsg that's clean dry and intact.  Allergies:  No Known Allergies   Past Medical History  Diagnosis Date  . Hypertension   . COPD (chronic obstructive pulmonary disease)   . Arthritis   . Pneumonia   . Weight loss, unintentional     History:  obtained from the patient.   Pt orientation to unit, room and routine. Information packet given to patient/family and safety video watched.  Admission INP armband ID verified with patient/family, and in place. SR up x 2, fall risk assessment complete with Patient and family verbalizing understanding of risks associated with falls. Pt verbalizes an understanding of how to use the call bell and to call for help before getting out of bed.  Skin, clean-dry- intact without evidence of bruising, or skin tears.   No evidence of skin break down noted on exam. no rashes    Will cont to monitor and assist as needed.  Claudia Wiley Consuella Lose, RN 10/19/2011 3:45 PM

## 2011-10-19 NOTE — ED Notes (Signed)
Crackers and peanut butter, apple juice provided per [place] by Dr. Lynelle Doctor

## 2011-10-19 NOTE — ED Notes (Signed)
Per Okey Regal, in lab, green top tube hemolyzed.  Attempted to re-draw, unable to obtain, will have another RN attempt; Lynita Lombard, RN made aware

## 2011-10-19 NOTE — ED Notes (Signed)
Coffee provided per OK by Dr. Lynelle Doctor

## 2011-10-20 ENCOUNTER — Inpatient Hospital Stay (HOSPITAL_COMMUNITY): Payer: Self-pay

## 2011-10-20 LAB — COMPREHENSIVE METABOLIC PANEL
ALT: 19 U/L (ref 0–35)
AST: 27 U/L (ref 0–37)
Albumin: 1.6 g/dL — ABNORMAL LOW (ref 3.5–5.2)
Alkaline Phosphatase: 123 U/L — ABNORMAL HIGH (ref 39–117)
BUN: <3 mg/dL — ABNORMAL LOW (ref 6–23)
CO2: 30 mEq/L (ref 19–32)
Calcium: 7.1 mg/dL — ABNORMAL LOW (ref 8.4–10.5)
Chloride: 107 mEq/L (ref 96–112)
Creatinine, Ser: 0.32 mg/dL — ABNORMAL LOW (ref 0.50–1.10)
GFR calc Af Amer: >90 mL/min (ref >90)
GFR calc non Af Amer: >90 mL/min (ref >90)
Glucose, Bld: 61 mg/dL — ABNORMAL LOW (ref 70–99)
Potassium: 3.4 mEq/L — ABNORMAL LOW (ref 3.5–5.1)
Sodium: 141 mEq/L (ref 135–145)
Total Bilirubin: 0.3 mg/dL (ref 0.3–1.2)
Total Protein: 3.8 g/dL — ABNORMAL LOW (ref 6.0–8.3)

## 2011-10-20 LAB — CEA: CEA: 9.6 ng/mL — ABNORMAL HIGH (ref 0.0–5.0)

## 2011-10-20 LAB — CBC
HCT: 29.8 % — ABNORMAL LOW (ref 36.0–46.0)
Hemoglobin: 10.5 g/dL — ABNORMAL LOW (ref 12.0–15.0)
MCH: 32.8 pg (ref 26.0–34.0)
MCHC: 35.2 g/dL (ref 30.0–36.0)
MCV: 93.1 fL (ref 78.0–100.0)
Platelets: 194 10*3/uL (ref 150–400)
RBC: 3.2 MIL/uL — ABNORMAL LOW (ref 3.87–5.11)
RDW: 12.6 % (ref 11.5–15.5)
WBC: 7.2 10*3/uL (ref 4.0–10.5)

## 2011-10-20 LAB — PROTIME-INR
INR: 1.38 (ref 0.00–1.49)
Prothrombin Time: 17.2 seconds — ABNORMAL HIGH (ref 11.6–15.2)

## 2011-10-20 LAB — HIV ANTIBODY (ROUTINE TESTING W REFLEX): HIV: NONREACTIVE

## 2011-10-20 LAB — MAGNESIUM: Magnesium: 1.4 mg/dL — ABNORMAL LOW (ref 1.5–2.5)

## 2011-10-20 LAB — HEMOGLOBIN A1C
Hgb A1c MFr Bld: 5.3 % (ref <5.7)
Mean Plasma Glucose: 105 mg/dL (ref <117)

## 2011-10-20 LAB — TSH: TSH: 0.924 u[IU]/mL (ref 0.350–4.500)

## 2011-10-20 LAB — PHOSPHORUS
Phosphorus: 1.8 mg/dL — ABNORMAL LOW (ref 2.3–4.6)
Phosphorus: 1.4 mg/dL — ABNORMAL LOW (ref 2.3–4.6)

## 2011-10-20 MED ORDER — POTASSIUM CHLORIDE CRYS ER 20 MEQ PO TBCR
40.0000 meq | EXTENDED_RELEASE_TABLET | Freq: Two times a day (BID) | ORAL | Status: AC
  Administered 2011-10-20: 40 meq via ORAL
  Filled 2011-10-20: qty 2

## 2011-10-20 MED ORDER — ADULT MULTIVITAMIN LIQUID CH
5.0000 mL | Freq: Every day | ORAL | Status: DC
  Administered 2011-10-20 – 2011-10-22 (×3): 5 mL via ORAL
  Filled 2011-10-20 (×3): qty 5

## 2011-10-20 MED ORDER — IOHEXOL 300 MG/ML  SOLN
50.0000 mL | Freq: Once | INTRAMUSCULAR | Status: AC | PRN
  Administered 2011-10-20: 50 mL via INTRAVENOUS

## 2011-10-20 MED ORDER — IOHEXOL 300 MG/ML  SOLN
20.0000 mL | INTRAMUSCULAR | Status: AC
  Administered 2011-10-20: 20 mL via ORAL

## 2011-10-20 MED ORDER — K PHOS MONO-SOD PHOS DI & MONO 155-852-130 MG PO TABS
500.0000 mg | ORAL_TABLET | Freq: Three times a day (TID) | ORAL | Status: DC
  Administered 2011-10-20 (×2): 500 mg via ORAL
  Filled 2011-10-20 (×11): qty 2

## 2011-10-20 MED ORDER — DIPHENHYDRAMINE HCL 50 MG/ML IJ SOLN
25.0000 mg | Freq: Four times a day (QID) | INTRAMUSCULAR | Status: DC | PRN
  Administered 2011-10-20 – 2011-10-22 (×7): 25 mg via INTRAVENOUS
  Filled 2011-10-20 (×7): qty 1

## 2011-10-20 MED ORDER — ENOXAPARIN SODIUM 30 MG/0.3ML ~~LOC~~ SOLN
20.0000 mg | SUBCUTANEOUS | Status: DC
  Administered 2011-10-20 – 2011-10-21 (×2): 20 mg via SUBCUTANEOUS
  Filled 2011-10-20 (×6): qty 0.2

## 2011-10-20 MED ORDER — MAGNESIUM SULFATE 40 MG/ML IJ SOLN
2.0000 g | Freq: Once | INTRAMUSCULAR | Status: AC
  Administered 2011-10-20: 2 g via INTRAVENOUS
  Filled 2011-10-20 (×2): qty 50

## 2011-10-20 NOTE — Progress Notes (Signed)
DAILY PROGRESS NOTE                              GENERAL INTERNAL MEDICINE [facility]  SUBJECTIVE: Feels okay denies any complaints. Weakness is much better.  OBJECTIVE: BP 112/86  Pulse 93  Temp(Src) 98.3 F (36.8 C) (Oral)  Resp 17  Ht 4\' 10"  (1.473 m)  Wt 26.6 kg (58 lb 10.3 oz)  BMI 12.26 kg/m2  SpO2 98% No intake or output data in the 24 hours ending 10/20/11 1125                    Weight change:  Physical Exam: General: Alert and awake oriented x3 not in any acute distress. HEENT: anicteric sclera, pupils equal reactive to light and accommodation CVS: S1-S2 heard, no murmur rubs or gallops Chest: clear to auscultation bilaterally, no wheezing rales or rhonchi Abdomen:  normal bowel sounds, soft, nontender, nondistended, no organomegaly Neuro: Cranial nerves II-XII intact, no focal neurological deficits Extremities: no cyanosis, no clubbing or edema noted bilaterally   Lab Results:  Basename 10/20/11 0434 10/19/11 1027 10/19/11 1022  NA 141 139 --  K 3.4* 2.0* --  CL 107 97 --  CO2 30 36* --  GLUCOSE 61* 74 --  BUN <3* <3* --  CREATININE 0.32* 0.40* --  CALCIUM 7.1* 7.6* --  MG 1.4* -- 1.5  PHOS 1.8* -- --    Basename 10/20/11 0434 10/19/11 1027  AST 27 29  ALT 19 23  ALKPHOS 123* 109  BILITOT 0.3 0.5  PROT 3.8* 4.5*  ALBUMIN 1.6* 2.1*    Basename 10/19/11 1027  LIPASE 9*  AMYLASE --    Basename 10/20/11 0434 10/19/11 1015  WBC 7.2 8.7  NEUTROABS -- 5.5  HGB 10.5* 13.5  HCT 29.8* 36.5  MCV 93.1 87.7  PLT 194 230   Basename 10/19/11 1800  HGBA1C 5.3   No results found for this basename: CHOL:2,HDL:2,LDLCALC:2,TRIG:2,CHOLHDL:2,LDLDIRECT:2 in the last 72 hours  Basename 10/19/11 1800  TSH 0.924  T4TOTAL --  T3FREE --  THYROIDAB --   No results found for this basename: VITAMINB12:2,FOLATE:2,FERRITIN:2,TIBC:2,IRON:2,RETICCTPCT:2 in the last 72 hours  Micro Results: No results found for this or any previous visit (from the past 240  hour(s)).  Studies/Results: Dg Chest 2 View  10/19/2011  *RADIOLOGY REPORT*  Clinical Data: Shortness of breath  CHEST - 2 VIEW  Comparison: 10/07/2011  Findings: Cardiomediastinal silhouette is stable.  Hyperinflation again noted.  No acute infiltrate or pleural effusion.  No pulmonary edema.  Bony thorax is stable.  Bilateral nodular nipple shadow again noted.  IMPRESSION: Stable COPD.  No active disease.  Original Report Authenticated By: Natasha Mead, M.D.   Dg Hip Complete Left  10/07/2011  *RADIOLOGY REPORT*  Clinical Data: Larey Seat 3 weeks ago.  Pain in the left shoulder and left hip.  No prior injury.  Frequent falls.  LEFT HIP - COMPLETE 2+ VIEW  Comparison: None.  Findings: AP and lateral views of the left hip include AP view of the pelvis.  No evidence for acute fracture or subluxation. Regional bowel gas pattern is nonobstructive.  Surgical clips overlie the right SI joint.  IMPRESSION: No evidence for acute  abnormality.  Original Report Authenticated By: Patterson Hammersmith, M.D.   Dg Chest Port 1 View  10/07/2011  *RADIOLOGY REPORT*  Clinical Data: Cough.  PORTABLE CHEST - 1 VIEW  Comparison: None.  Findings: The lungs are  hyperaerated; this raises concern for COPD. Mild peribronchial thickening is noted.  There is no evidence of focal opacification, pleural effusion or pneumothorax.  Bilateral nipple shadows are noted.  The cardiomediastinal silhouette is within normal limits.  No acute osseous abnormalities are seen.  IMPRESSION: Mild peribronchial thickening noted; findings likely reflect COPD. No focal consolidation seen.  Original Report Authenticated By: Tonia Ghent, M.D.   Dg Shoulder Left  10/07/2011  *RADIOLOGY REPORT*  Clinical Data: Pain.  Fall 3 weeks ago.  Frequent falls.  LEFT SHOULDER - 2+ VIEW  Comparison: None.  Findings: There is no evidence for acute fracture or dislocation. No soft tissue foreign body or gas identified.  Left lung apex is unremarkable in appearance.   IMPRESSION: Negative exam.  Original Report Authenticated By: Patterson Hammersmith, M.D.   Medications: Scheduled Meds:   . budesonide-formoterol  1 puff Inhalation BID  . enoxaparin  40 mg Subcutaneous Q24H  . feeding supplement  237 mL Oral TID BM  . magnesium sulfate 1 - 4 g bolus IVPB  2 g Intravenous Once  . pantoprazole  40 mg Oral Daily  . phosphorus  500 mg Oral TID  . potassium chloride  40 mEq Oral Once  . potassium chloride SA      . potassium chloride  40 mEq Oral BID  . potassium chloride  60 mEq Oral Q8H  . DISCONTD: sodium chloride   Intravenous STAT  . DISCONTD: ENSURE  237 mL Oral TID BM  . DISCONTD: potassium chloride  40 mEq Oral Once  . DISCONTD: potassium chloride  10 mEq Intravenous Q1 Hr x 2   Continuous Infusions:   . sodium chloride 100 mL/hr at 10/19/11 2357   PRN Meds:.acetaminophen, acetaminophen, HYDROcodone-acetaminophen, morphine, ondansetron (ZOFRAN) IV, ondansetron  ASSESSMENT & PLAN: Principal Problem:  *Hypokalemia Active Problems:  Malnutrition  Weight loss  Generalized weakness  Dysphagia  Anodontia  Hypokalemia  -This is could be secondary to her decreased oral intake. -This will be aggressively replaced by IV and oral supplementation.  -We'll check potassium level in the morning. I'll check also magnesium level.   Hypomagnesemia/hypophosphatemia -Will replace aggressively. -This electrolytes abnormalities might represent mild form of refeeding syndrome.  Weight loss/malnutrition  Has significant weight loss, patient used to weigh about 100 pounds since last year she lost about 40 pounds. Patient had problems before with esophageal stricture and she reported balloon dilatations twice. She is taken PPI for GERD. I will obtain esophagogram. And obtain the records from [facility]. Will check HIV, diabetes and her thyroid function. If these are normal on my check for malabsorption   Generalized weakness  This is likely  secondary to hypokalemia and malnutrition.   Dysphagia  Patient has problems even with soft food, I will obtain esophagogram.    LOS: 1 day   Liliani Bobo A 10/20/2011, 11:25 AM

## 2011-10-20 NOTE — Progress Notes (Signed)
Requested records from [facility] hospital regarding esophageal stricture and dilatations. [facility] states patient did not have that done there.

## 2011-10-21 ENCOUNTER — Inpatient Hospital Stay (HOSPITAL_COMMUNITY): Payer: Self-pay

## 2011-10-21 LAB — COMPREHENSIVE METABOLIC PANEL
ALT: 17 U/L (ref 0–35)
AST: 26 U/L (ref 0–37)
Albumin: 1.5 g/dL — ABNORMAL LOW (ref 3.5–5.2)
Alkaline Phosphatase: 102 U/L (ref 39–117)
BUN: <3 mg/dL — ABNORMAL LOW (ref 6–23)
CO2: 27 mEq/L (ref 19–32)
Calcium: 6.9 mg/dL — ABNORMAL LOW (ref 8.4–10.5)
Chloride: 102 mEq/L (ref 96–112)
Creatinine, Ser: 0.28 mg/dL — ABNORMAL LOW (ref 0.50–1.10)
GFR calc Af Amer: >90 mL/min (ref >90)
GFR calc non Af Amer: >90 mL/min (ref >90)
Glucose, Bld: 80 mg/dL (ref 70–99)
Potassium: 3 mEq/L — ABNORMAL LOW (ref 3.5–5.1)
Sodium: 135 mEq/L (ref 135–145)
Total Bilirubin: 0.3 mg/dL (ref 0.3–1.2)
Total Protein: 3.5 g/dL — ABNORMAL LOW (ref 6.0–8.3)

## 2011-10-21 LAB — URINE CULTURE
Colony Count: 3000
Culture  Setup Time: 201304070154

## 2011-10-21 LAB — MAGNESIUM: Magnesium: 1.7 mg/dL (ref 1.5–2.5)

## 2011-10-21 LAB — URIC ACID: Uric Acid, Serum: 1.9 mg/dL — ABNORMAL LOW (ref 2.4–7.0)

## 2011-10-21 MED ORDER — BOOST / RESOURCE BREEZE PO LIQD
1.0000 | Freq: Two times a day (BID) | ORAL | Status: DC
  Administered 2011-10-21 – 2011-10-22 (×2): 1 via ORAL

## 2011-10-21 MED ORDER — ENSURE COMPLETE PO LIQD
237.0000 mL | Freq: Every day | ORAL | Status: DC
  Administered 2011-10-22: 237 mL via ORAL

## 2011-10-21 MED ORDER — POTASSIUM CHLORIDE 20 MEQ/15ML (10%) PO LIQD
60.0000 meq | Freq: Three times a day (TID) | ORAL | Status: AC
  Administered 2011-10-21 (×2): 60 meq via ORAL
  Filled 2011-10-21 (×2): qty 45

## 2011-10-21 NOTE — Progress Notes (Signed)
INITIAL ADULT NUTRITION ASSESSMENT Date: 10/21/2011   Time: 9:24 AM Reason for Assessment: Consult, unintentional weight loss  ASSESSMENT: Female 56 y.o.  Dx: Hypokalemia  Hx:  Past Medical History  Diagnosis Date  . Hypertension   . COPD (chronic obstructive pulmonary disease)   . Arthritis   . Pneumonia   . Weight loss, unintentional     Related Meds:     . budesonide-formoterol  1 puff Inhalation BID  . enoxaparin (LOVENOX) injection  20 mg Subcutaneous Q24H  . feeding supplement  237 mL Oral TID BM  . iohexol  20 mL Oral Q1 Hr x 2  . magnesium sulfate 1 - 4 g bolus IVPB  2 g Intravenous Once  . mulitivitamin  5 mL Oral Daily  . pantoprazole  40 mg Oral Daily  . phosphorus  500 mg Oral TID  . potassium chloride  60 mEq Oral TID  . potassium chloride  40 mEq Oral BID  . DISCONTD: enoxaparin  40 mg Subcutaneous Q24H     Ht: 4\' 10"  (147.3 cm)  Wt: 58 lb 10.3 oz (26.6 kg)  Ideal Wt: 43.9 kg % Ideal Wt: 61%  Usual Wt: 100 lbs (45.4 kg) Wt Readings from Last 3 Encounters:  10/19/11 58 lb 10.3 oz (26.6 kg)  10/07/11 46 lb 8 oz (21.092 kg)    % Usual Wt: 58%  Body mass index is 12.26 kg/(m^2). Underweight  Food/Nutrition Related Hx: Pt reports difficulty with swallowing, frequent vomiting and weight loss since her teeth extractions in April of last year.   Labs:  CMP     Component Value Date/Time   NA 135 10/21/2011 0535   K 3.0* 10/21/2011 0535   CL 102 10/21/2011 0535   CO2 27 10/21/2011 0535   GLUCOSE 80 10/21/2011 0535   BUN <3* 10/21/2011 0535   CREATININE 0.28* 10/21/2011 0535   CALCIUM 6.9* 10/21/2011 0535   PROT 3.5* 10/21/2011 0535   ALBUMIN 1.5* 10/21/2011 0535   AST 26 10/21/2011 0535   ALT 17 10/21/2011 0535   ALKPHOS 102 10/21/2011 0535   BILITOT 0.3 10/21/2011 0535   GFRNONAA >90 10/21/2011 0535   GFRAA >90 10/21/2011 0535    Intake/Output Summary (Last 24 hours) at 10/21/11 0928 Last data filed at 10/20/11 1756  Gross per 24 hour  Intake   3405 ml  Output       0 ml  Net   3405 ml     Diet Order: Dysphagia 3 thin  Supplements/Tube Feeding: Ensure Complete, TID  IVF:    sodium chloride Last Rate: 100 mL/hr at 10/21/11 0701    Estimated Nutritional Needs:   Kcal: 1500-1700 Protein: 65-75 gm Fluid:  > 1.5 L  Pt is very underweight. Pt has had 43 lb weight loss in the last year, 43%, severe weight loss. Pt reports eating limited foods, many make her vomit. Eats mostly soups and "freeze pops." Does tolerate some beverages, does not like Ensure very much. Pt has severe visable muscle and fat wasting.Pt meets criteria for severe malnutrition in the context of chronic illness 2/2 to weight loss, energy intake, and muscle/fat wasting.  RD had pt try [employer], pt agreeable to drinking them BID as able. Pt also c/o swallowing problems, recommend SLP evaluation. Pt with difficulty chewing r/t tooth extractions, on D3 diet, pt is tolerating ok at this time.   NUTRITION DIAGNOSIS: -Inadequate oral intake (NI-2.1).  Status: Ongoing  RELATED TO: dysphagia, N/V  AS EVIDENCE BY: weight  loss, 43% in 1 year  MONITORING/EVALUATION(Goals): Goal: PO intake of meals and supplements will promote weight gain Monitor: PO intake, weight, labs, results of esophagogram, HIV/DM/Thyroid tests  EDUCATION NEEDS: -No education needs identified at this time  INTERVENTION: 1. Change Ensure Complete to once daily 2. Add Resource Breeze BID 3. RD will continue to follow  Dietitian [phone]  DOCUMENTATION CODES Per approved criteria  -Severe malnutrition in the context of chronic illness -Underweight    KOWALSKI, Rami Waddle MARIE 10/21/2011, 9:24 AM

## 2011-10-21 NOTE — Progress Notes (Signed)
Patient left ankle swollen, elevated on pillows, heat pack applied, pain medicine administered.  Patient complains of throbbing in ankle, when ambulating there is pain when applying her body weight.  Will continue to monitor.  Macarthur Critchley, RN

## 2011-10-21 NOTE — Progress Notes (Signed)
DAILY PROGRESS NOTE                              [facility]  SUBJECTIVE: Feels okay denies any complaints. Weakness is much better.  OBJECTIVE: BP 94/56  Pulse 81  Temp(Src) 98 F (36.7 C) (Oral)  Resp 18  Ht 4\' 10"  (1.473 m)  Wt 26.6 kg (58 lb 10.3 oz)  BMI 12.26 kg/m2  SpO2 98%  Intake/Output Summary (Last 24 hours) at 10/21/11 1046 Last data filed at 10/20/11 1756  Gross per 24 hour  Intake   3405 ml  Output      0 ml  Net   3405 ml                      Weight change:  Physical Exam: General: Alert and awake oriented x3 not in any acute distress. HEENT: anicteric sclera, pupils equal reactive to light and accommodation CVS: S1-S2 heard, no murmur rubs or gallops Chest: clear to auscultation bilaterally, no wheezing rales or rhonchi Abdomen:  normal bowel sounds, soft, nontender, nondistended, no organomegaly Neuro: Cranial nerves II-XII intact, no focal neurological deficits Extremities: no cyanosis, no clubbing or edema noted bilaterally   Lab Results:  Basename 10/21/11 0535 10/20/11 1242 10/20/11 0434  NA 135 -- 141  K 3.0* -- 3.4*  CL 102 -- 107  CO2 27 -- 30  GLUCOSE 80 -- 61*  BUN <3* -- <3*  CREATININE 0.28* -- 0.32*  CALCIUM 6.9* -- 7.1*  MG 1.7 -- 1.4*  PHOS -- 1.4* 1.8*    Basename 10/21/11 0535 10/20/11 0434  AST 26 27  ALT 17 19  ALKPHOS 102 123*  BILITOT 0.3 0.3  PROT 3.5* 3.8*  ALBUMIN 1.5* 1.6*    Basename 10/19/11 1027  LIPASE 9*  AMYLASE --    Basename 10/20/11 0434 10/19/11 1015  WBC 7.2 8.7  NEUTROABS -- 5.5  HGB 10.5* 13.5  HCT 29.8* 36.5  MCV 93.1 87.7  PLT 194 230    Basename 10/19/11 1800  HGBA1C 5.3   No results found for this basename: CHOL:2,HDL:2,LDLCALC:2,TRIG:2,CHOLHDL:2,LDLDIRECT:2 in the last 72 hours  Basename 10/19/11 1800  TSH 0.924  T4TOTAL --  T3FREE --  THYROIDAB --   No results found for this basename: VITAMINB12:2,FOLATE:2,FERRITIN:2,TIBC:2,IRON:2,RETICCTPCT:2  in the last 72 hours  Micro Results: Recent Results (from the past 240 hour(s))  URINE CULTURE     Status: Normal   Collection Time   10/19/11  8:35 PM      Component Value Range Status Comment   Specimen Description URINE, CLEAN CATCH   Final    Special Requests NONE   Final    Culture  Setup Time [id number]   Final    Colony Count 3,000 COLONIES/ML   Final    Culture INSIGNIFICANT GROWTH   Final    Report Status 10/21/2011 FINAL   Final     Studies/Results: Dg Chest 2 View  10/19/2011  *RADIOLOGY REPORT*  Clinical Data: Shortness of breath  CHEST - 2 VIEW  Comparison: 10/07/2011  Findings: Cardiomediastinal silhouette is stable.  Hyperinflation again noted.  No acute infiltrate or pleural effusion.  No pulmonary edema.  Bony thorax is stable.  Bilateral nodular nipple shadow again noted.  IMPRESSION: Stable COPD.  No active disease.  Original Report Authenticated By: Natasha Mead, M.D.   Dg Hip Complete Left  10/07/2011  *RADIOLOGY REPORT*  Clinical  Data: Larey Seat 3 weeks ago.  Pain in the left shoulder and left hip.  No prior injury.  Frequent falls.  LEFT HIP - COMPLETE 2+ VIEW  Comparison: None.  Findings: AP and lateral views of the left hip include AP view of the pelvis.  No evidence for acute fracture or subluxation. Regional bowel gas pattern is nonobstructive.  Surgical clips overlie the right SI joint.  IMPRESSION: No evidence for acute  abnormality.  Original Report Authenticated By: Patterson Hammersmith, M.D.   Dg Chest Port 1 View  10/07/2011  *RADIOLOGY REPORT*  Clinical Data: Cough.  PORTABLE CHEST - 1 VIEW  Comparison: None.  Findings: The lungs are hyperaerated; this raises concern for COPD. Mild peribronchial thickening is noted.  There is no evidence of focal opacification, pleural effusion or pneumothorax.  Bilateral nipple shadows are noted.  The cardiomediastinal silhouette is within normal limits.  No acute osseous abnormalities are seen.  IMPRESSION: Mild peribronchial  thickening noted; findings likely reflect COPD. No focal consolidation seen.  Original Report Authenticated By: Tonia Ghent, M.D.   Dg Shoulder Left  10/07/2011  *RADIOLOGY REPORT*  Clinical Data: Pain.  Fall 3 weeks ago.  Frequent falls.  LEFT SHOULDER - 2+ VIEW  Comparison: None.  Findings: There is no evidence for acute fracture or dislocation. No soft tissue foreign body or gas identified.  Left lung apex is unremarkable in appearance.  IMPRESSION: Negative exam.  Original Report Authenticated By: Patterson Hammersmith, M.D.   Medications: Scheduled Meds:    . budesonide-formoterol  1 puff Inhalation BID  . enoxaparin (LOVENOX) injection  20 mg Subcutaneous Q24H  . feeding supplement  237 mL Oral Q1200  . feeding supplement  1 Container Oral BID AC  . iohexol  20 mL Oral Q1 Hr x 2  . mulitivitamin  5 mL Oral Daily  . pantoprazole  40 mg Oral Daily  . phosphorus  500 mg Oral TID  . potassium chloride  60 mEq Oral TID  . potassium chloride  40 mEq Oral BID  . DISCONTD: enoxaparin  40 mg Subcutaneous Q24H  . DISCONTD: feeding supplement  237 mL Oral TID BM   Continuous Infusions:    . sodium chloride 100 mL/hr at 10/21/11 0701   PRN Meds:.acetaminophen, acetaminophen, diphenhydrAMINE, HYDROcodone-acetaminophen, iohexol, morphine, ondansetron (ZOFRAN) IV, ondansetron  ASSESSMENT & PLAN: Principal Problem:  *Hypokalemia Active Problems:  Severe protein-calorie malnutrition  Weight loss  Generalized weakness  Dysphagia  Anodontia  Hypokalemia  -This is could be secondary to her decreased oral intake. -This will be aggressively replaced by IV and oral supplementation.  -We'll check potassium level in the morning. I'll check also magnesium level.   Hypomagnesemia/hypophosphatemia -Will replace aggressively. -This electrolytes abnormalities might represent mild form of refeeding syndrome.  Weight loss/severe malnutrition  -Has significant weight loss, patient used to weigh  about 100 pounds since last year she lost about 40 pounds. -Patient had problems before with esophageal stricture and she reported balloon dilatations twice.  -She is taken PPI for GERD. I will obtain esophagogram.  -Patient reported that she had EGD and colonoscopy done in [facility] last year, the hospital contacted and they report no admissions last year.  Left ankle pain  -Looks like an acute gouty arthritis, we'll check uric acid level and treat with pain medications.  Generalized weakness  This is likely secondary to hypokalemia and malnutrition.   Dysphagia  -Patient has problems even with soft food, I will obtain esophagogram. -Esophagram  showed normal swallowing, her swallowing problems likely secondary to anodontia.    LOS: 2 days   Claudia Wiley 10/21/2011, 10:46 AM

## 2011-10-21 NOTE — Progress Notes (Signed)
   CARE MANAGEMENT NOTE 10/21/2011  Patient:  Claudia Wiley, Claudia Wiley   Account Number:  [id number]  Date Initiated:  10/21/2011  Documentation initiated by:  Letha Cape  Subjective/Objective Assessment:   dx hypokalemia  admit- lives with son.  [facility] .     Action/Plan:   Anticipated DC Date:  10/22/2011   Anticipated DC Plan:  HOME/SELF CARE      DC Planning Services  CM consult  VA referrals / transfers      Choice offered to / List presented to:             Status of service:  In process, will continue to follow Medicare Important Message given?   (If response is "NO", the following Medicare IM given date fields will be blank) Date Medicare IM given:   Date Additional Medicare IM given:    Discharge Disposition:    Per UR Regulation:    If discussed at Long Length of Stay Meetings, dates discussed:    Comments:  10/21/11 11:34 Letha Cape RN, BSN [phone] patient lives with son, pta independent, patient [facility] with [facility] on [facility].  Patient will have VA benefits after she sees the [place] MD, but she will need medications filled before then is she does not already have some at home  (Symbicort, Nexium, Bonevia), patient went down to xray before I could find out if she has these meds at home, I will check with her when she comes back.

## 2011-10-22 LAB — COMPREHENSIVE METABOLIC PANEL
ALT: 19 U/L (ref 0–35)
AST: 34 U/L (ref 0–37)
Albumin: 1.5 g/dL — ABNORMAL LOW (ref 3.5–5.2)
Alkaline Phosphatase: 109 U/L (ref 39–117)
BUN: <3 mg/dL — ABNORMAL LOW (ref 6–23)
CO2: 24 mEq/L (ref 19–32)
Calcium: 7.7 mg/dL — ABNORMAL LOW (ref 8.4–10.5)
Chloride: 107 mEq/L (ref 96–112)
Creatinine, Ser: 0.25 mg/dL — ABNORMAL LOW (ref 0.50–1.10)
GFR calc Af Amer: >90 mL/min (ref >90)
GFR calc non Af Amer: >90 mL/min (ref >90)
Glucose, Bld: 87 mg/dL (ref 70–99)
Potassium: 5.2 mEq/L — ABNORMAL HIGH (ref 3.5–5.1)
Sodium: 135 mEq/L (ref 135–145)
Total Bilirubin: 0.3 mg/dL (ref 0.3–1.2)
Total Protein: 3.8 g/dL — ABNORMAL LOW (ref 6.0–8.3)

## 2011-10-22 LAB — PHOSPHORUS: Phosphorus: 2.7 mg/dL (ref 2.3–4.6)

## 2011-10-22 MED ORDER — ESOMEPRAZOLE MAGNESIUM 40 MG PO CPDR: 40.0000 mg | DELAYED_RELEASE_CAPSULE | Freq: Every day | ORAL | Status: DC

## 2011-10-22 NOTE — Discharge Summary (Signed)
HOSPITAL DISCHARGE SUMMARY  Claudia Wiley  MRN: [id number]  DOB:1955/07/18  Date of Admission: 10/19/2011 Date of Discharge: 10/22/2011         LOS: 3 days   Attending Physician:  Clydia Llano A  Patient's PCP: DR Hulen Skains, at [facility] clinic Consults: None  Discharge Diagnoses: Present on Admission:  .Hypokalemia .Severe protein-calorie malnutrition .Weight loss .Generalized weakness .Dysphagia .Anodontia   Medication List  As of 10/22/2011 12:41 PM   TAKE these medications         budesonide-formoterol 160-4.5 MCG/ACT inhaler   Commonly known as: SYMBICORT   Inhale 1 puff into the lungs daily as needed.      ENSURE   Take 237 mLs by mouth 2 (two) times daily between meals.      esomeprazole 40 MG capsule   Commonly known as: NEXIUM   Take 1 capsule (40 mg total) by mouth daily before breakfast.      ibandronate 150 MG tablet   Commonly known as: BONIVA   Take 150 mg by mouth every 30 (thirty) days. Take in the morning with a full glass of water, on an empty stomach, and do not take anything else by mouth or lie down for the next 30 min.             Brief Admission History: [name] is a 56 y.o. female with past medical history of esophageal stricture, COPD and arthritis. Patient came in to the hospital complaining about tingling in her legs. Patient initially said she had some swelling in her lower extremity which is gone now, but she continues to have tingling and abnormal sensation in her both legs. She also have generalized weakness apart from that she did not mention any other complaints. Upon initial evaluation in the emergency department patient was found to be severely monitor patient's, patient has lost over 40 pounds in the last year, she has hypoalbuminemia and severe hypokalemia. Patient attributed her weight loss to 2 factors including inability to swallow solids and difficulty with chewing food because he does not have any teeth or dentures. Patient  admitted to the hospital for the severe hypokalemia and to further workup the significant weight loss.  Hospital Course: Present on Admission:  .Hypokalemia .Severe protein-calorie malnutrition .Weight loss .Generalized weakness .Dysphagia .Anodontia  1. Hypokalemia: This could be secondary to her decreased oral intake. She denies any diuretic use, denies any diarrhea. This is been aggressively replaced by IV and oral supplementations. Potassium level when she came in was 2.0 potassium at the time of discharge 5.1. This is probably also contributing to her generalized weakness.  2. Weight loss/severe malnutrition Patient had very significant weight loss. Patient used to weigh about 100 pound and since last year she lost about 45-50 pounds. Patient said she had problems before with acid reflux and esophageal stricture. Patient reported that she had an EGD/colonoscopy done before with esophageal balloon dilatation and has 2 benign polyps. Because of significant weight loss HIV, diabetes, thyroid problems all checked and being negative. Patient said she had all her teeth extracted about a year ago and since then (along with her esophageal stricture) she lost this much weight because of she cannot chew solid food. Esophagus be evaluated by barium swallow, patient also had CT scan of abdomen pelvis. The study is being normal and did not show any mucosal abnormality or tumors. The esophagogram showed findings might be consistent with esophagitis/gastritis. Patient was on Protonix and she will be on Nexium when she goes  home. Patient [facility] for [facility] evaluation of the weight loss/malnutrition. The patient initially she needs dentures. And consider to repeat EGD/colonoscopy.  3. Dysphagia: As mentioned above, barium swallow was performed in showed normal swallowing and some irregularities in the esophageal/gastric mucosa consistent with esophagitis/gastritis. Patient to  continue on the PPI.  4. Left ankle pain: Patient mentioned some ankle pain. She said she fell the 2 weeks ago, x-ray of the left ankle showed no evidence of fracture. Patient can bear weight on it and instructed to take over-the-counter pain medications like Tylenol.  5. Hypomagnesemia/hypophosphatemia: Along with the hypokalemia and this is might be mild form of refeeding syndrome. These abnormalities were aggressively corrected by both IV and oral forms of supplementations.  Day of Discharge BP 115/55  Pulse 81  Temp(Src) 98.6 F (37 C) (Oral)  Resp 18  Ht 4\' 10"  (1.473 m)  Wt 26.6 kg (58 lb 10.3 oz)  BMI 12.26 kg/m2  SpO2 99% Physical Exam: GEN: No acute distress, cooperative with exam PSYCH: alert and oriented x4; does not appear anxious does not appear depressed; affect is normal  HEENT: Mucous membranes pink and anicteric;  Mouth: without oral thrush or lesions Eyes: PERRLA; EOM intact;  Neck: no cervical lymphadenopathy nor thyromegaly or carotid bruit; no JVD;  CHEST WALL: No tenderness, symmetrical to breathing bilaterally CHEST: Normal respiration, clear to auscultation bilaterally  HEART: Regular rate and rhythm; no murmurs, rubs or gallops, S1 and S2 heard  BACK: No kyphosis or scoliosis; no CVA tenderness  ABDOMEN:  soft non-tender; no masses, no organomegaly, normal abdominal bowel sounds; no pannus; no intertriginous candida.  EXTREMITIES: No bone or joint deformity; no edema; no ulcerations.  PULSES: 2+ and symmetric, neurovascularity is intact SKIN: Normal hydration no rash or ulceration, no flushing or suspicious lesions  CNS: Cranial nerves 2-12 grossly intact no focal neurologic deficit, coordination is intact gait not tested    Results for orders placed during the hospital encounter of 10/19/11 (from the past 24 hour(s))  COMPREHENSIVE METABOLIC PANEL     Status: Abnormal   Collection Time   10/22/11  7:00 AM      Component Value Range   Sodium 135  135 -  145 (mEq/L)   Potassium 5.2 (*) 3.5 - 5.1 (mEq/L)   Chloride 107  96 - 112 (mEq/L)   CO2 24  19 - 32 (mEq/L)   Glucose, Bld 87  70 - 99 (mg/dL)   BUN <3 (*) 6 - 23 (mg/dL)   Creatinine, Ser 0.09 (*) 0.50 - 1.10 (mg/dL)   Calcium 7.7 (*) 8.4 - 10.5 (mg/dL)   Total Protein 3.8 (*) 6.0 - 8.3 (g/dL)   Albumin 1.5 (*) 3.5 - 5.2 (g/dL)   AST 34  0 - 37 (U/L)   ALT 19  0 - 35 (U/L)   Alkaline Phosphatase 109  39 - 117 (U/L)   Total Bilirubin 0.3  0.3 - 1.2 (mg/dL)   GFR calc non Af Amer >90  >90 (mL/min)   GFR calc Af Amer >90  >90 (mL/min)  PHOSPHORUS     Status: Normal   Collection Time   10/22/11  7:00 AM      Component Value Range   Phosphorus 2.7  2.3 - 4.6 (mg/dL)    Disposition: Home   Follow-up Appts: Discharge Orders    Future Orders Please Complete By Expires   Increase activity slowly         Follow-up  Information    Follow up with [facility] on 12/11/2011. (With Dr Hulen Skains)          I spent 40 minutes completing paperwork and coordinating discharge efforts.  SignedClydia Llano A 10/22/2011, 12:41 PM

## 2011-10-22 NOTE — Progress Notes (Signed)
Elie Goody to be D/C'd Home per MD order.  Discussed with the patient and all questions fully answered.   Analycia, Khokhar  Home Medication Instructions [id number]   Printed on:10/22/11 1613  Medication Information                    ENSURE (ENSURE) Take 237 mLs by mouth 2 (two) times daily between meals.           budesonide-formoterol (SYMBICORT) 160-4.5 MCG/ACT inhaler Inhale 1 puff into the lungs daily as needed.           ibandronate (BONIVA) 150 MG tablet Take 150 mg by mouth every 30 (thirty) days. Take in the morning with a full glass of water, on an empty stomach, and do not take anything else by mouth or lie down for the next 30 min.           esomeprazole (NEXIUM) 40 MG capsule Take 1 capsule (40 mg total) by mouth daily before breakfast.             VVS, Skin clean, dry and intact without evidence of skin break down, no evidence of skin tears noted. IV catheter discontinued intact. Site without signs and symptoms of complications. Dressing and pressure applied.  An After Visit Summary was printed and given to the patient. Patient escorted via WC, and D/C home via private auto.  Kennyth Arnold D 10/22/2011 4:13 PM

## 2011-10-22 NOTE — Progress Notes (Signed)
Lab stated that patient refused for labs to be drawn this am. Will continue to monitor patient. Nelda Marseille, RN

## 2011-10-22 NOTE — Progress Notes (Signed)
Notified Dr. Adela Glimpse that patient's BP is 84/52 HR 86. Patient is asymptomatic. No new orders given.  Will continue to monitor patient. Nelda Marseille, Rn

## 2011-10-23 NOTE — Progress Notes (Signed)
   CARE MANAGEMENT NOTE 10/23/2011  Patient:  Claudia Wiley, Claudia Wiley   Account Number:  [id number]  Date Initiated:  10/21/2011  Documentation initiated by:  Letha Cape  Subjective/Objective Assessment:   dx hypokalemia  admit- lives with son.  [facility]/29 .     Action/Plan:   Anticipated DC Date:  10/22/2011   Anticipated DC Plan:  HOME/SELF CARE      DC Planning Services  CM consult  VA referrals / transfers  Medication Assistance      Choice offered to / List presented to:             Status of service:  Completed, signed off Medicare Important Message given?   (If response is "NO", the following Medicare IM given date fields will be blank) Date Medicare IM given:   Date Additional Medicare IM given:    Discharge Disposition:  HOME/SELF CARE  Per UR Regulation:    If discussed at Long Length of Stay Meetings, dates discussed:    Comments:  10/21/11 11:34 Letha Cape RN, BSN [phone] patient lives with son, pta independent, patient has appointment with [facility] on 5/29.  Patient will have VA benefits after she sees the [place] MD, but she will need medications filled before then is she does not already have some at home  (Symbicort, Nexium, Bonevia), patient went down to xray before I could find out if she has these meds at home, I will check with her when she comes back. Patient is eligible for zz fund if needed. Assited with nexium meds.

## 2011-10-26 ENCOUNTER — Encounter (HOSPITAL_BASED_OUTPATIENT_CLINIC_OR_DEPARTMENT_OTHER): Payer: Self-pay | Admitting: *Deleted

## 2011-10-26 ENCOUNTER — Emergency Department (HOSPITAL_BASED_OUTPATIENT_CLINIC_OR_DEPARTMENT_OTHER)
Admission: EM | Admit: 2011-10-26 | Discharge: 2011-10-26 | Disposition: A | Discharge status: Home / Self Care | Payer: Medicaid Other | Attending: Emergency Medicine | Admitting: Emergency Medicine

## 2011-10-26 DIAGNOSIS — R609 Edema, unspecified: Secondary | ICD-10-CM | POA: Insufficient documentation

## 2011-10-26 DIAGNOSIS — M7989 Other specified soft tissue disorders: Secondary | ICD-10-CM | POA: Insufficient documentation

## 2011-10-26 DIAGNOSIS — R7989 Other specified abnormal findings of blood chemistry: Secondary | ICD-10-CM | POA: Insufficient documentation

## 2011-10-26 DIAGNOSIS — R112 Nausea with vomiting, unspecified: Secondary | ICD-10-CM | POA: Insufficient documentation

## 2011-10-26 DIAGNOSIS — F17200 Nicotine dependence, unspecified, uncomplicated: Secondary | ICD-10-CM | POA: Insufficient documentation

## 2011-10-26 DIAGNOSIS — I10 Essential (primary) hypertension: Secondary | ICD-10-CM | POA: Insufficient documentation

## 2011-10-26 DIAGNOSIS — J4489 Other specified chronic obstructive pulmonary disease: Secondary | ICD-10-CM | POA: Insufficient documentation

## 2011-10-26 DIAGNOSIS — R109 Unspecified abdominal pain: Secondary | ICD-10-CM | POA: Insufficient documentation

## 2011-10-26 DIAGNOSIS — R64 Cachexia: Secondary | ICD-10-CM | POA: Insufficient documentation

## 2011-10-26 DIAGNOSIS — R197 Diarrhea, unspecified: Secondary | ICD-10-CM | POA: Insufficient documentation

## 2011-10-26 DIAGNOSIS — Z8701 Personal history of pneumonia (recurrent): Secondary | ICD-10-CM | POA: Insufficient documentation

## 2011-10-26 DIAGNOSIS — J449 Chronic obstructive pulmonary disease, unspecified: Secondary | ICD-10-CM | POA: Insufficient documentation

## 2011-10-26 DIAGNOSIS — M129 Arthropathy, unspecified: Secondary | ICD-10-CM | POA: Insufficient documentation

## 2011-10-26 LAB — URINALYSIS, ROUTINE W REFLEX MICROSCOPIC
Glucose, UA: NEGATIVE mg/dL
Hgb urine dipstick: NEGATIVE
Ketones, ur: 15 mg/dL — AB
Leukocytes, UA: NEGATIVE
Nitrite: NEGATIVE
Protein, ur: 30 mg/dL — AB
Specific Gravity, Urine: 1.041 — ABNORMAL HIGH (ref 1.005–1.030)
Urobilinogen, UA: 1 mg/dL (ref 0.0–1.0)
pH: 5.5 (ref 5.0–8.0)

## 2011-10-26 LAB — CBC
HCT: 33.7 % — ABNORMAL LOW (ref 36.0–46.0)
Hemoglobin: 12.3 g/dL (ref 12.0–15.0)
MCH: 32.6 pg (ref 26.0–34.0)
MCHC: 36.5 g/dL — ABNORMAL HIGH (ref 30.0–36.0)
MCV: 89.4 fL (ref 78.0–100.0)
Platelets: 311 10*3/uL (ref 150–400)
RBC: 3.77 MIL/uL — ABNORMAL LOW (ref 3.87–5.11)
RDW: 13.8 % (ref 11.5–15.5)
WBC: 8.7 10*3/uL (ref 4.0–10.5)

## 2011-10-26 LAB — URINE MICROSCOPIC-ADD ON

## 2011-10-26 LAB — LIPASE, BLOOD: Lipase: 11 U/L (ref 11–59)

## 2011-10-26 LAB — COMPREHENSIVE METABOLIC PANEL
ALT: 99 U/L — ABNORMAL HIGH (ref 0–35)
AST: 224 U/L — ABNORMAL HIGH (ref 0–37)
Albumin: 2.1 g/dL — ABNORMAL LOW (ref 3.5–5.2)
Alkaline Phosphatase: 124 U/L — ABNORMAL HIGH (ref 39–117)
BUN: 6 mg/dL (ref 6–23)
CO2: 29 mEq/L (ref 19–32)
Calcium: 8.3 mg/dL — ABNORMAL LOW (ref 8.4–10.5)
Chloride: 105 mEq/L (ref 96–112)
Creatinine, Ser: 0.4 mg/dL — ABNORMAL LOW (ref 0.50–1.10)
GFR calc Af Amer: >90 mL/min (ref >90)
GFR calc non Af Amer: >90 mL/min (ref >90)
Glucose, Bld: 89 mg/dL (ref 70–99)
Potassium: 4.7 mEq/L (ref 3.5–5.1)
Sodium: 142 mEq/L (ref 135–145)
Total Bilirubin: 0.5 mg/dL (ref 0.3–1.2)
Total Protein: 4.8 g/dL — ABNORMAL LOW (ref 6.0–8.3)

## 2011-10-26 MED ORDER — MORPHINE SULFATE 4 MG/ML IJ SOLN
4.0000 mg | Freq: Once | INTRAMUSCULAR | Status: AC
  Administered 2011-10-26: 4 mg via INTRAVENOUS
  Filled 2011-10-26: qty 1

## 2011-10-26 MED ORDER — MORPHINE SULFATE 4 MG/ML IJ SOLN
4.0000 mg | Freq: Once | INTRAMUSCULAR | Status: AC
  Administered 2011-10-26: 4 mg via INTRAVENOUS

## 2011-10-26 MED ORDER — SODIUM CHLORIDE 0.9 % IV SOLN
Freq: Once | INTRAVENOUS | Status: AC
  Administered 2011-10-26: 17:00:00 via INTRAVENOUS

## 2011-10-26 MED ORDER — MORPHINE SULFATE 4 MG/ML IJ SOLN
INTRAMUSCULAR | Status: AC
  Administered 2011-10-26: 4 mg via INTRAVENOUS
  Filled 2011-10-26: qty 1

## 2011-10-26 MED ORDER — OXYCODONE-ACETAMINOPHEN 5-325 MG PO TABS
ORAL_TABLET | ORAL | Status: AC
  Administered 2011-10-26: 1 via ORAL
  Filled 2011-10-26: qty 1

## 2011-10-26 MED ORDER — OXYCODONE HCL 5 MG PO CAPS: 5.0000 mg | ORAL_CAPSULE | ORAL | Status: AC | PRN

## 2011-10-26 MED ORDER — OXYCODONE-ACETAMINOPHEN 5-325 MG PO TABS
1.0000 | ORAL_TABLET | Freq: Once | ORAL | Status: AC
  Administered 2011-10-26: 1 via ORAL

## 2011-10-26 MED ORDER — ONDANSETRON HCL 4 MG/2ML IJ SOLN
4.0000 mg | Freq: Once | INTRAMUSCULAR | Status: AC
  Administered 2011-10-26: 4 mg via INTRAVENOUS
  Filled 2011-10-26: qty 2

## 2011-10-26 NOTE — ED Provider Notes (Signed)
History     CSN: [id number]  Arrival date & time 10/26/11  1528   First MD Initiated Contact with Patient 10/26/11 1602      Chief Complaint  Patient presents with  . Abdominal Pain    (Consider location/radiation/quality/duration/timing/severity/associated sxs/prior treatment) HPI Comments: Pt states that she was discharged from the hospital 4 days days ago for malnutrition and swelling in her body:pt states that the swelling has gone down but has continued in her feet:pt states that she had some vomiting the day of discharge, but has not had any since:pt states that she has had a 40lb wt loss in the last year:pt states that she has not followed up with the va yet:pt states that the abdominal pain that she is having is similar to prior to admission  Patient is a 56 y.o. female presenting with abdominal pain. The history is provided by the patient. No language interpreter was used.  Abdominal Pain The primary symptoms of the illness include abdominal pain, nausea, vomiting and diarrhea. The primary symptoms of the illness do not include fever or dysuria. The current episode started more than 2 days ago. The onset of the illness was gradual. The problem has not changed since onset.   Past Medical History  Diagnosis Date  . Hypertension   . COPD (chronic obstructive pulmonary disease)   . Arthritis   . Pneumonia   . Weight loss, unintentional     Past Surgical History  Procedure Date  . Abdominal hysterectomy   . Cesarean section     History reviewed. No pertinent family history.  History  Substance Use Topics  . Smoking status: Current Everyday Smoker -- 0.2 packs/day for 2 years    Types: Cigarettes  . Smokeless tobacco: Never Used  . Alcohol Use: No    OB History    Grav Para Term Preterm Abortions TAB SAB Ect Mult Living                  Review of Systems  Constitutional: Negative for fever.  Gastrointestinal: Positive for nausea, vomiting, abdominal pain and  diarrhea.  Genitourinary: Negative for dysuria.  All other systems reviewed and are negative.    Allergies  Review of patient's allergies indicates no known allergies.  Home Medications   Current Outpatient Rx  Name Route Sig Dispense Refill  . ACETAMINOPHEN 325 MG PO TABS Oral Take 650 mg by mouth every 6 (six) hours as needed. Patient used this medication for pain.    . BUDESONIDE-FORMOTEROL FUMARATE 160-4.5 MCG/ACT IN AERO Inhalation Inhale 1 puff into the lungs daily as needed.    . ENSURE PO LIQD Oral Take 237 mLs by mouth 2 (two) times daily between meals.    . IBANDRONATE SODIUM 150 MG PO TABS Oral Take 150 mg by mouth every 30 (thirty) days. Take in the morning with a full glass of water, on an empty stomach, and do not take anything else by mouth or lie down for the next 30 min.    Marland Kitchen ESOMEPRAZOLE MAGNESIUM 40 MG PO CPDR Oral Take 1 capsule (40 mg total) by mouth daily before breakfast. 30 capsule 0    BP 118/88  Pulse 110  Temp(Src) 98 F (36.7 C) (Oral)  Resp 20  Ht 4\' 10"  (1.473 m)  Wt 52 lb (23.587 kg)  BMI 10.87 kg/m2  SpO2 94%  Physical Exam  Nursing note and vitals reviewed. Constitutional: She is oriented to person, place, and time. She appears well-developed. She  appears cachectic.  HENT:  Head: Normocephalic and atraumatic.  Eyes: Conjunctivae and EOM are normal. Pupils are equal, round, and reactive to light.  Neck: Neck supple.  Cardiovascular: Normal rate and regular rhythm.   Pulmonary/Chest: Effort normal and breath sounds normal.  Abdominal: Soft. Bowel sounds are normal. There is no tenderness.  Musculoskeletal:       Pt has edema noted to bilateral feet  Neurological: She is alert and oriented to person, place, and time.  Skin: Skin is warm and dry.    ED Course  Procedures (including critical care time)  Labs Reviewed  CBC - Abnormal; Notable for the following:    RBC 3.77 (*)    HCT 33.7 (*)    MCHC 36.5 (*)    All other components  within normal limits  COMPREHENSIVE METABOLIC PANEL - Abnormal; Notable for the following:    Creatinine, Ser 0.40 (*)    Calcium 8.3 (*)    Total Protein 4.8 (*)    Albumin 2.1 (*)    AST 224 (*)    ALT 99 (*)    Alkaline Phosphatase 124 (*)    All other components within normal limits  URINALYSIS, ROUTINE W REFLEX MICROSCOPIC - Abnormal; Notable for the following:    Color, Urine AMBER (*) BIOCHEMICALS MAY BE AFFECTED BY COLOR   APPearance TURBID (*)    Specific Gravity, Urine 1.041 (*)    Bilirubin Urine MODERATE (*)    Ketones, ur 15 (*)    Protein, ur 30 (*)    All other components within normal limits  URINE MICROSCOPIC-ADD ON - Abnormal; Notable for the following:    Squamous Epithelial / LPF MANY (*)    Bacteria, UA MANY (*)    Casts HYALINE CASTS (*) WBC CAST   All other components within normal limits  LIPASE, BLOOD  URINE CULTURE   No results found.   1. Elevated LFTs   2. Swollen feet       MDM  lft further elevated then on admission:pt [facility]:pt had a negative ct in the last week:no change in symptoms:don't think further imaging is needed:pt denies etoh:discussed pt with Dr. Samuel Germany, NP 10/26/11 1901

## 2011-10-26 NOTE — ED Notes (Signed)
Pt here last Sat and admitted to [facility]. D/C'd on the 9th and has had abd pain and foot pain and swelling since. Dx'd with gout and malnutrition.

## 2011-10-26 NOTE — Discharge Instructions (Signed)
Edema Edema is a buildup of fluids. It is most common in the feet, ankles, and legs. This happens more as a person ages. It may affect one or both legs. HOME CARE   Raise (elevate) the legs or ankles above the level of the heart while lying down.   Avoid sitting or standing still for a long time.   Exercise the legs to help the puffiness (swelling) go down.   A low-salt diet may help lessen the puffiness.   Only take medicine as told by your doctor.  GET HELP RIGHT AWAY IF:   You develop shortness of breath or chest pain.   You cannot breathe when you lie down.   You have more puffiness that does not go away with treatment.   You develop pain or redness in the areas that are puffy.   You have a temperature by mouth above 102 F (38.9 C), not controlled by medicine.   You gain 3 lb/1.4 kg or more in 1 day or 5 lb/2.3 kg in a week.  MAKE SURE YOU:   Understand these instructions.   Will watch your condition.   Will get help right away if you are not doing well or get worse.  Document Released: 12/18/2007 Document Revised: 06/20/2011 Document Reviewed: 12/18/2007 [employer] Patient Information 2012 [employer]. 

## 2011-10-26 NOTE — ED Notes (Signed)
Upon attempting to discharge patient, she states that she does not want to go until she gets another dose of pain medication.

## 2011-10-26 NOTE — ED Provider Notes (Signed)
Medical screening examination/treatment/procedure(s) were performed by non-physician practitioner and as supervising physician I was immediately available for consultation/collaboration.    Nicholes Hibler L Ahaana Rochette, MD 10/26/11 2252 

## 2011-10-28 LAB — URINE CULTURE
Colony Count: 40000
Culture  Setup Time: 201304141133

## 2011-11-20 ENCOUNTER — Emergency Department (INDEPENDENT_AMBULATORY_CARE_PROVIDER_SITE_OTHER): Payer: Self-pay

## 2011-11-20 ENCOUNTER — Emergency Department (HOSPITAL_BASED_OUTPATIENT_CLINIC_OR_DEPARTMENT_OTHER)
Admission: EM | Admit: 2011-11-20 | Discharge: 2011-11-20 | Disposition: A | Discharge status: Home / Self Care | Payer: Self-pay | Attending: Emergency Medicine | Admitting: Emergency Medicine

## 2011-11-20 ENCOUNTER — Encounter (HOSPITAL_BASED_OUTPATIENT_CLINIC_OR_DEPARTMENT_OTHER): Payer: Self-pay

## 2011-11-20 DIAGNOSIS — R1900 Intra-abdominal and pelvic swelling, mass and lump, unspecified site: Secondary | ICD-10-CM

## 2011-11-20 DIAGNOSIS — R109 Unspecified abdominal pain: Secondary | ICD-10-CM | POA: Insufficient documentation

## 2011-11-20 DIAGNOSIS — R10819 Abdominal tenderness, unspecified site: Secondary | ICD-10-CM | POA: Insufficient documentation

## 2011-11-20 DIAGNOSIS — R609 Edema, unspecified: Secondary | ICD-10-CM | POA: Insufficient documentation

## 2011-11-20 DIAGNOSIS — F17200 Nicotine dependence, unspecified, uncomplicated: Secondary | ICD-10-CM | POA: Insufficient documentation

## 2011-11-20 DIAGNOSIS — M129 Arthropathy, unspecified: Secondary | ICD-10-CM | POA: Insufficient documentation

## 2011-11-20 DIAGNOSIS — J449 Chronic obstructive pulmonary disease, unspecified: Secondary | ICD-10-CM | POA: Insufficient documentation

## 2011-11-20 DIAGNOSIS — J4489 Other specified chronic obstructive pulmonary disease: Secondary | ICD-10-CM | POA: Insufficient documentation

## 2011-11-20 DIAGNOSIS — R634 Abnormal weight loss: Secondary | ICD-10-CM | POA: Insufficient documentation

## 2011-11-20 DIAGNOSIS — I10 Essential (primary) hypertension: Secondary | ICD-10-CM | POA: Insufficient documentation

## 2011-11-20 DIAGNOSIS — R64 Cachexia: Secondary | ICD-10-CM | POA: Insufficient documentation

## 2011-11-20 DIAGNOSIS — Z79899 Other long term (current) drug therapy: Secondary | ICD-10-CM | POA: Insufficient documentation

## 2011-11-20 LAB — DIFFERENTIAL
Basophils Absolute: 0 10*3/uL (ref 0.0–0.1)
Basophils Relative: 0 % (ref 0–1)
Eosinophils Absolute: 0.1 10*3/uL (ref 0.0–0.7)
Eosinophils Relative: 1 % (ref 0–5)
Lymphocytes Relative: 29 % (ref 12–46)
Lymphs Abs: 2 10*3/uL (ref 0.7–4.0)
Monocytes Absolute: 0.7 10*3/uL (ref 0.1–1.0)
Monocytes Relative: 10 % (ref 3–12)
Neutro Abs: 4.1 10*3/uL (ref 1.7–7.7)
Neutrophils Relative %: 60 % (ref 43–77)

## 2011-11-20 LAB — CBC
HCT: 33 % — ABNORMAL LOW (ref 36.0–46.0)
Hemoglobin: 11.5 g/dL — ABNORMAL LOW (ref 12.0–15.0)
MCH: 31 pg (ref 26.0–34.0)
MCHC: 34.8 g/dL (ref 30.0–36.0)
MCV: 88.9 fL (ref 78.0–100.0)
Platelets: 286 10*3/uL (ref 150–400)
RBC: 3.71 MIL/uL — ABNORMAL LOW (ref 3.87–5.11)
RDW: 15.2 % (ref 11.5–15.5)
WBC: 6.8 10*3/uL (ref 4.0–10.5)

## 2011-11-20 LAB — COMPREHENSIVE METABOLIC PANEL
ALT: 12 U/L (ref 0–35)
AST: 27 U/L (ref 0–37)
Albumin: 2 g/dL — ABNORMAL LOW (ref 3.5–5.2)
Alkaline Phosphatase: 112 U/L (ref 39–117)
BUN: 8 mg/dL (ref 6–23)
CO2: 35 mEq/L — ABNORMAL HIGH (ref 19–32)
Calcium: 8.2 mg/dL — ABNORMAL LOW (ref 8.4–10.5)
Chloride: 104 mEq/L (ref 96–112)
Creatinine, Ser: 0.4 mg/dL — ABNORMAL LOW (ref 0.50–1.10)
GFR calc Af Amer: >90 mL/min (ref >90)
GFR calc non Af Amer: >90 mL/min (ref >90)
Glucose, Bld: 89 mg/dL (ref 70–99)
Potassium: 3.3 mEq/L — ABNORMAL LOW (ref 3.5–5.1)
Sodium: 140 mEq/L (ref 135–145)
Total Bilirubin: 0.2 mg/dL — ABNORMAL LOW (ref 0.3–1.2)
Total Protein: 4.8 g/dL — ABNORMAL LOW (ref 6.0–8.3)

## 2011-11-20 LAB — LIPASE, BLOOD: Lipase: 30 U/L (ref 11–59)

## 2011-11-20 MED ORDER — HYDROCODONE-ACETAMINOPHEN 5-500 MG PO TABS: 1.0000 | ORAL_TABLET | Freq: Four times a day (QID) | ORAL | Status: AC | PRN

## 2011-11-20 MED ORDER — HYDROCODONE-ACETAMINOPHEN 5-325 MG PO TABS
1.0000 | ORAL_TABLET | Freq: Once | ORAL | Status: AC
  Administered 2011-11-20: 1 via ORAL
  Filled 2011-11-20: qty 1

## 2011-11-20 MED ORDER — SODIUM CHLORIDE 0.9 % IV SOLN
Freq: Once | INTRAVENOUS | Status: AC
  Administered 2011-11-20: 20:00:00 via INTRAVENOUS

## 2011-11-20 MED ORDER — MORPHINE SULFATE 4 MG/ML IJ SOLN
4.0000 mg | Freq: Once | INTRAMUSCULAR | Status: AC
  Administered 2011-11-20: 4 mg via INTRAVENOUS
  Filled 2011-11-20: qty 1

## 2011-11-20 MED ORDER — ONDANSETRON HCL 4 MG/2ML IJ SOLN
4.0000 mg | Freq: Once | INTRAMUSCULAR | Status: AC
  Administered 2011-11-20: 4 mg via INTRAVENOUS
  Filled 2011-11-20: qty 2

## 2011-11-20 NOTE — ED Notes (Signed)
MD at bedside. 

## 2011-11-20 NOTE — ED Notes (Signed)
C/o abd pain x 2 months-recent ED visits and admn for same

## 2011-11-20 NOTE — ED Notes (Signed)
Pt has 2+ pitting edema bil to ankles.  Mild edema to abdomen.

## 2011-11-20 NOTE — Discharge Instructions (Signed)

## 2011-11-20 NOTE — ED Provider Notes (Signed)
History     CSN: [id number]  Arrival date & time 11/20/11  1914   First MD Initiated Contact with Patient 11/20/11 1926      Chief Complaint  Patient presents with  . Abdominal Pain    (Consider location/radiation/quality/duration/timing/severity/associated sxs/prior treatment) HPI Comments: Patient has history of recent weight loss, abd pain.  Was admitted to [facility] last month for hypokalemia, malnutrition, abd pain.  No cause was found and she is to follow up with the [facility] hospital next month.  She returns today complaining of abd pain, swelling in legs.  No vomiting or diarrhea.  Patient is a 56 y.o. female presenting with abdominal pain.  Abdominal Pain The primary symptoms of the illness include abdominal pain. The primary symptoms of the illness do not include fever, nausea or vomiting. Episode onset: 2 months. The onset of the illness was gradual. The problem has been gradually worsening.  The patient has not had a change in bowel habit.    Past Medical History  Diagnosis Date  . Hypertension   . COPD (chronic obstructive pulmonary disease)   . Arthritis   . Pneumonia   . Weight loss, unintentional     Past Surgical History  Procedure Date  . Abdominal hysterectomy   . Cesarean section     No family history on file.  History  Substance Use Topics  . Smoking status: Current Everyday Smoker -- 0.2 packs/day for 2 years    Types: Cigarettes  . Smokeless tobacco: Never Used  . Alcohol Use: No    OB History    Grav Para Term Preterm Abortions TAB SAB Ect Mult Living                  Review of Systems  Constitutional: Negative for fever.  Gastrointestinal: Positive for abdominal pain. Negative for nausea and vomiting.  All other systems reviewed and are negative.    Allergies  Review of patient's allergies indicates no known allergies.  Home Medications   Current Outpatient Rx  Name Route Sig Dispense Refill  . ACETAMINOPHEN 325 MG PO TABS Oral Take 650  mg by mouth every 6 (six) hours as needed. Patient used this medication for pain.    . BUDESONIDE-FORMOTEROL FUMARATE 160-4.5 MCG/ACT IN AERO Inhalation Inhale 1 puff into the lungs daily as needed.    . ENSURE PO LIQD Oral Take 237 mLs by mouth 2 (two) times daily between meals.    Marland Kitchen ESOMEPRAZOLE MAGNESIUM 40 MG PO CPDR Oral Take 1 capsule (40 mg total) by mouth daily before breakfast. 30 capsule 0  . IBANDRONATE SODIUM 150 MG PO TABS Oral Take 150 mg by mouth every 30 (thirty) days. Take in the morning with a full glass of water, on an empty stomach, and do not take anything else by mouth or lie down for the next 30 min.      BP 135/100  Pulse 109  Temp(Src) 98.5 F (36.9 C) (Oral)  Resp 20  Ht 4\' 10"  (1.473 m)  Wt 69 lb (31.298 kg)  BMI 14.42 kg/m2  SpO2 99%  Physical Exam  Nursing note and vitals reviewed. Constitutional: She is oriented to person, place, and time.       Patient is extremely cachectic, but in no acute distress.  HENT:  Head: Normocephalic and atraumatic.  Neck: Normal range of motion. Neck supple.  Cardiovascular: Normal rate and regular rhythm.   Pulmonary/Chest: Effort normal and breath sounds normal.  Abdominal: Soft. She exhibits no  distension.       There is mild to moderate ttp in all four quadrants, but no rebound or guarding.  Musculoskeletal: Normal range of motion.       There is 2+ ble edema.  Neurological: She is alert and oriented to person, place, and time.  Skin: Skin is warm and dry.    ED Course  Procedures (including critical care time)   Labs Reviewed  CBC  DIFFERENTIAL  COMPREHENSIVE METABOLIC PANEL  LIPASE, BLOOD   No results found.   No diagnosis found.    MDM  She is feeling better with meds and fluids.  The labs look okay.  She is most definitely thin, but has actually been gaining weight over the past month.  I have advised her to follow up with her pcp asap for follow up and further evaluation.  No other apparent  emergent condition tonight.          Geoffery Lyons, MD 11/20/11 2121

## 2012-12-27 ENCOUNTER — Encounter (HOSPITAL_BASED_OUTPATIENT_CLINIC_OR_DEPARTMENT_OTHER): Payer: Self-pay

## 2012-12-27 ENCOUNTER — Emergency Department (HOSPITAL_BASED_OUTPATIENT_CLINIC_OR_DEPARTMENT_OTHER): Payer: Self-pay

## 2012-12-27 ENCOUNTER — Emergency Department (HOSPITAL_BASED_OUTPATIENT_CLINIC_OR_DEPARTMENT_OTHER)
Admission: EM | Admit: 2012-12-27 | Discharge: 2012-12-27 | Disposition: A | Discharge status: Home / Self Care | Payer: Self-pay | Attending: Emergency Medicine | Admitting: Emergency Medicine

## 2012-12-27 ENCOUNTER — Telehealth (HOSPITAL_COMMUNITY): Payer: Self-pay | Admitting: Emergency Medicine

## 2012-12-27 DIAGNOSIS — Z791 Long term (current) use of non-steroidal anti-inflammatories (NSAID): Secondary | ICD-10-CM | POA: Insufficient documentation

## 2012-12-27 DIAGNOSIS — J4489 Other specified chronic obstructive pulmonary disease: Secondary | ICD-10-CM | POA: Insufficient documentation

## 2012-12-27 DIAGNOSIS — Z79899 Other long term (current) drug therapy: Secondary | ICD-10-CM | POA: Insufficient documentation

## 2012-12-27 DIAGNOSIS — R112 Nausea with vomiting, unspecified: Secondary | ICD-10-CM | POA: Insufficient documentation

## 2012-12-27 DIAGNOSIS — IMO0002 Reserved for concepts with insufficient information to code with codable children: Secondary | ICD-10-CM | POA: Insufficient documentation

## 2012-12-27 DIAGNOSIS — R197 Diarrhea, unspecified: Secondary | ICD-10-CM | POA: Insufficient documentation

## 2012-12-27 DIAGNOSIS — F17200 Nicotine dependence, unspecified, uncomplicated: Secondary | ICD-10-CM | POA: Insufficient documentation

## 2012-12-27 DIAGNOSIS — Z8701 Personal history of pneumonia (recurrent): Secondary | ICD-10-CM | POA: Insufficient documentation

## 2012-12-27 DIAGNOSIS — Z90710 Acquired absence of both cervix and uterus: Secondary | ICD-10-CM | POA: Insufficient documentation

## 2012-12-27 DIAGNOSIS — J449 Chronic obstructive pulmonary disease, unspecified: Secondary | ICD-10-CM | POA: Insufficient documentation

## 2012-12-27 DIAGNOSIS — I10 Essential (primary) hypertension: Secondary | ICD-10-CM | POA: Insufficient documentation

## 2012-12-27 LAB — URINALYSIS, ROUTINE W REFLEX MICROSCOPIC
Bilirubin Urine: NEGATIVE
Glucose, UA: NEGATIVE mg/dL
Hgb urine dipstick: NEGATIVE
Ketones, ur: >80 mg/dL — AB
Leukocytes, UA: NEGATIVE
Nitrite: NEGATIVE
Protein, ur: 30 mg/dL — AB
Specific Gravity, Urine: 1.042 — ABNORMAL HIGH (ref 1.005–1.030)
Urobilinogen, UA: 0.2 mg/dL (ref 0.0–1.0)
pH: 6 (ref 5.0–8.0)

## 2012-12-27 LAB — CBC WITH DIFFERENTIAL/PLATELET
Basophils Absolute: 0 10*3/uL (ref 0.0–0.1)
Basophils Relative: 0 % (ref 0–1)
Eosinophils Absolute: 0.1 10*3/uL (ref 0.0–0.7)
Eosinophils Relative: 1 % (ref 0–5)
HCT: 48.3 % — ABNORMAL HIGH (ref 36.0–46.0)
Hemoglobin: 17.5 g/dL — ABNORMAL HIGH (ref 12.0–15.0)
Lymphocytes Relative: 16 % (ref 12–46)
Lymphs Abs: 1.8 10*3/uL (ref 0.7–4.0)
MCH: 34.5 pg — ABNORMAL HIGH (ref 26.0–34.0)
MCHC: 36.2 g/dL — ABNORMAL HIGH (ref 30.0–36.0)
MCV: 95.3 fL (ref 78.0–100.0)
Monocytes Absolute: 0.8 10*3/uL (ref 0.1–1.0)
Monocytes Relative: 7 % (ref 3–12)
Neutro Abs: 8.3 10*3/uL — ABNORMAL HIGH (ref 1.7–7.7)
Neutrophils Relative %: 76 % (ref 43–77)
Platelets: 210 10*3/uL (ref 150–400)
RBC: 5.07 MIL/uL (ref 3.87–5.11)
RDW: 12.3 % (ref 11.5–15.5)
WBC: 11 10*3/uL — ABNORMAL HIGH (ref 4.0–10.5)

## 2012-12-27 LAB — COMPREHENSIVE METABOLIC PANEL
ALT: 35 U/L (ref 0–35)
AST: 51 U/L — ABNORMAL HIGH (ref 0–37)
Albumin: 4.3 g/dL (ref 3.5–5.2)
Alkaline Phosphatase: 146 U/L — ABNORMAL HIGH (ref 39–117)
BUN: 10 mg/dL (ref 6–23)
CO2: 21 mEq/L (ref 19–32)
Calcium: 9.7 mg/dL (ref 8.4–10.5)
Chloride: 94 mEq/L — ABNORMAL LOW (ref 96–112)
Creatinine, Ser: 0.6 mg/dL (ref 0.50–1.10)
GFR calc Af Amer: >90 mL/min (ref >90)
GFR calc non Af Amer: >90 mL/min (ref >90)
Glucose, Bld: 226 mg/dL — ABNORMAL HIGH (ref 70–99)
Potassium: 3.5 mEq/L (ref 3.5–5.1)
Sodium: 140 mEq/L (ref 135–145)
Total Bilirubin: 0.5 mg/dL (ref 0.3–1.2)
Total Protein: 7.6 g/dL (ref 6.0–8.3)

## 2012-12-27 LAB — URINE MICROSCOPIC-ADD ON

## 2012-12-27 LAB — LIPASE, BLOOD: Lipase: 12 U/L (ref 11–59)

## 2012-12-27 MED ORDER — SODIUM CHLORIDE 0.9 % IV BOLUS (SEPSIS)
1000.0000 mL | Freq: Once | INTRAVENOUS | Status: AC
  Administered 2012-12-27: 1000 mL via INTRAVENOUS

## 2012-12-27 MED ORDER — IOHEXOL 300 MG/ML  SOLN
58.0000 mL | Freq: Once | INTRAMUSCULAR | Status: AC | PRN
  Administered 2012-12-27: 58 mL via INTRAVENOUS

## 2012-12-27 MED ORDER — PROMETHAZINE HCL 12.5 MG RE SUPP: 12.5000 mg | Freq: Four times a day (QID) | RECTAL | Status: DC | PRN

## 2012-12-27 MED ORDER — HYDROCODONE-ACETAMINOPHEN 5-325 MG PO TABS: 1.0000 | ORAL_TABLET | Freq: Four times a day (QID) | ORAL | Status: DC | PRN

## 2012-12-27 MED ORDER — ONDANSETRON HCL 4 MG/2ML IJ SOLN
4.0000 mg | Freq: Once | INTRAMUSCULAR | Status: AC
  Administered 2012-12-27: 4 mg via INTRAVENOUS
  Filled 2012-12-27: qty 2

## 2012-12-27 MED ORDER — IOHEXOL 300 MG/ML  SOLN
50.0000 mL | Freq: Once | INTRAMUSCULAR | Status: AC | PRN
  Administered 2012-12-27: 50 mL via ORAL

## 2012-12-27 MED ORDER — KETOROLAC TROMETHAMINE 30 MG/ML IJ SOLN
30.0000 mg | Freq: Once | INTRAMUSCULAR | Status: AC
  Administered 2012-12-27: 30 mg via INTRAVENOUS
  Filled 2012-12-27: qty 1

## 2012-12-27 MED ORDER — MORPHINE SULFATE 4 MG/ML IJ SOLN
4.0000 mg | Freq: Once | INTRAMUSCULAR | Status: AC
  Administered 2012-12-27: 4 mg via INTRAVENOUS
  Filled 2012-12-27: qty 1

## 2012-12-27 NOTE — ED Notes (Signed)
Pt reports onset of pain since Friday and pain has increased to the point that it is unbearable. Pt arrived with daughter who is a very good historian relate to patient illness . Daughter departing at this time and request to be called prior to pat discharge or admission [phone]

## 2012-12-27 NOTE — ED Notes (Signed)
Patient here with generalized abdominal pain with vomiting and diarrhea x 2 days. Reports that she has general headache as well. All symptoms started after she took tylenol cold medicine

## 2012-12-27 NOTE — ED Notes (Signed)
Pharmacy calling about Rx for Phenergan Suppositories 12.5 mg, Pts insurance does not cover them can they change to pill?  L. Allyne Gee PA consulted and ok to change to Phenergan 12.5 mg tablet w/same quantity.  Pharmacy notified

## 2012-12-27 NOTE — ED Provider Notes (Signed)
History     CSN: [id number]  Arrival date & time 12/27/12  0113   First MD Initiated Contact with Patient 12/27/12 0144      Chief Complaint  Patient presents with  . Abdominal Pain    (Consider location/radiation/quality/duration/timing/severity/associated sxs/prior treatment) Patient is a 57 y.o. female presenting with abdominal pain and vomiting. The history is provided by the patient. No language interpreter was used.  Abdominal Pain This is a recurrent problem. The current episode started 2 days ago. The problem occurs constantly. The problem has not changed since onset.Associated symptoms include abdominal pain. Pertinent negatives include no chest pain and no shortness of breath. Nothing aggravates the symptoms. Nothing relieves the symptoms. She has tried nothing for the symptoms. The treatment provided no relief.  Emesis Severity:  Moderate Duration:  2 days Quality:  Stomach contents Progression:  Unchanged Chronicity:  New Recent urination:  Normal Relieved by:  Nothing Worsened by:  Nothing tried Ineffective treatments:  None tried Associated symptoms: abdominal pain and diarrhea   Diarrhea:    Quality:  Watery   Severity:  Moderate   Duration:  2 days   Progression:  Unchanged Risk factors: no sick contacts     Past Medical History  Diagnosis Date  . Hypertension   . COPD (chronic obstructive pulmonary disease)   . Arthritis   . Pneumonia   . Weight loss, unintentional     Past Surgical History  Procedure Laterality Date  . Abdominal hysterectomy    . Cesarean section      No family history on file.  History  Substance Use Topics  . Smoking status: Current Every Day Smoker -- 0.25 packs/day for 2 years    Types: Cigarettes  . Smokeless tobacco: Never Used  . Alcohol Use: No    OB History   Grav Para Term Preterm Abortions TAB SAB Ect Mult Living                  Review of Systems  Respiratory: Negative for shortness of breath.    Cardiovascular: Negative for chest pain.  Gastrointestinal: Positive for vomiting, abdominal pain and diarrhea.  All other systems reviewed and are negative.    Allergies  Review of patient's allergies indicates no known allergies.  Home Medications   Current Outpatient Rx  Name  Route  Sig  Dispense  Refill  . hydrochlorothiazide (HYDRODIURIL) 25 MG tablet   Oral   Take 25 mg by mouth daily.         . Vitamin D, Ergocalciferol, (DRISDOL) 50000 UNITS CAPS   Oral   Take 50,000 Units by mouth.         Marland Kitchen acetaminophen (TYLENOL) 325 MG tablet   Oral   Take 650 mg by mouth every 6 (six) hours as needed. Patient used this medication for pain.         . budesonide-formoterol (SYMBICORT) 160-4.5 MCG/ACT inhaler   Inhalation   Inhale 1 puff into the lungs daily as needed.         Marland Kitchen esomeprazole (NEXIUM) 40 MG capsule   Oral   Take 1 capsule (40 mg total) by mouth daily before breakfast.   30 capsule   0   . naproxen sodium (ANAPROX) 220 MG tablet   Oral   Take 220 mg by mouth 2 (two) times daily with a meal.           BP 151/94  Pulse 95  Temp(Src) 98.4 F (36.9 C) (Oral)  Resp 20  Wt 58 lb (26.309 kg)  BMI 12.13 kg/m2  SpO2 98%  Physical Exam  Constitutional: She is oriented to person, place, and time. She appears cachectic.  HENT:  Head: Normocephalic and atraumatic.  Mouth/Throat: No oropharyngeal exudate.  Eyes: Conjunctivae are normal. Pupils are equal, round, and reactive to light.  Neck: Normal range of motion. Neck supple.  Cardiovascular: Normal rate, regular rhythm and intact distal pulses.   Pulmonary/Chest: Effort normal and breath sounds normal. She has no wheezes. She has no rales.  Abdominal: Soft. Bowel sounds are normal. There is no tenderness. There is no rebound and no guarding.  Musculoskeletal: Normal range of motion.  Neurological: She is alert and oriented to person, place, and time.  Skin: Skin is warm and dry.  Psychiatric:  She has a normal mood and affect.    ED Course  Procedures (including critical care time)  Labs Reviewed  CBC WITH DIFFERENTIAL - Abnormal; Notable for the following:    WBC 11.0 (*)    Hemoglobin 17.5 (*)    HCT 48.3 (*)    MCH 34.5 (*)    MCHC 36.2 (*)    Neutro Abs 8.3 (*)    All other components within normal limits  COMPREHENSIVE METABOLIC PANEL - Abnormal; Notable for the following:    Glucose, Bld 226 (*)    AST 51 (*)    Alkaline Phosphatase 146 (*)    All other components within normal limits  LIPASE, BLOOD  URINALYSIS, ROUTINE W REFLEX MICROSCOPIC   No results found.   No diagnosis found.    MDM  Improved post medication.  Will treat with antiemetics.  Recommend probiotics.  Follow up with your family doctor for ongoing care       Chatara Lucente K Bhavana Kady-Rasch, MD 12/27/12 0530

## 2016-11-11 ENCOUNTER — Encounter (HOSPITAL_COMMUNITY)
Admission: RE | Admit: 2016-11-11 | Discharge: 2016-11-11 | Disposition: A | Discharge status: Home / Self Care | Payer: Medicaid Other | Source: Ambulatory Visit | Attending: Pulmonary Disease | Admitting: Pulmonary Disease

## 2016-11-11 ENCOUNTER — Encounter (HOSPITAL_COMMUNITY): Payer: Self-pay

## 2016-11-11 VITALS — BP 136/89 | HR 80 | Ht 59.0 in | Wt 86.6 lb

## 2016-11-11 DIAGNOSIS — R0602 Shortness of breath: Secondary | ICD-10-CM

## 2016-11-11 DIAGNOSIS — Z029 Encounter for administrative examinations, unspecified: Secondary | ICD-10-CM | POA: Insufficient documentation

## 2016-11-11 NOTE — Progress Notes (Signed)
Claudia Wiley 61 y.o. female Pulmonary Rehab Orientation Note Patient arrived today in Cardiac and Pulmonary Rehab for orientation to Pulmonary Rehab. She walked from the [place] with her daughter taking it slow and stopping to rest occassionally. She does not carry portable oxygen. Per pt, she does not use oxygen presently.  She was on 2 liters after having the flu and pneumonia in January. Color good, skin warm and dry. Patient is oriented to time and place. Patient's medical history, psychosocial health, and medications reviewed. Psychosocial assessment reveals pt lives with their daughter and grandaughter. Pt is currently unemployed, disabled after lung cancer and stomach cancer in 2013-2014.  She is severely underweight, 86 # and BMI of 17. Pt hobbies include crocheting. Pt reports her stress level is low. Areas of stress/anxiety include Finances.  Pt does not exhibit  signs of depression. PHQ2/9 score 0/0. Pt shows good  coping skills with positive outlook . Npo psychosocial concerns were identified.  Physical assessment reveals heart rate is normal, breath sounds clear to auscultation, no wheezes, rales, or rhonchi. Grip strength equal, strong. Distal pulses 3+ bilateral posterior tibial pulses present. Patient reports she does take medications as prescribed. Patient states she follows a Regular diet. The patient has been trying to gain weight by drinking ensure in addition to eating her 3 meals.. Patient's weight will be monitored closely. Demonstration and practice of PLB using pulse oximeter. Patient able to return demonstration satisfactorily. Safety and hand hygiene in the exercise area reviewed with patient. Patient voices understanding of the information reviewed. Department expectations discussed with patient and achievable goals were set. The patient shows enthusiasm about attending the program and we look forward to working with this nice lady. The patient is scheduled for a 6 min walk test on  Thursday, Nov 14, 2016 @ 3:45 pm and to begin exercise on Thursday, Nov 21, 2016 in the 1:30 pm. 0930-1150 spent orienting patient and completing history and physical.

## 2016-11-14 ENCOUNTER — Ambulatory Visit (HOSPITAL_COMMUNITY): Payer: Medicaid Other

## 2016-11-19 ENCOUNTER — Encounter (HOSPITAL_COMMUNITY)
Admission: RE | Admit: 2016-11-19 | Discharge: 2016-11-19 | Disposition: A | Discharge status: Home / Self Care | Payer: Non-veteran care | Source: Ambulatory Visit | Attending: Pulmonary Disease | Admitting: Pulmonary Disease

## 2016-11-19 DIAGNOSIS — R0602 Shortness of breath: Secondary | ICD-10-CM

## 2016-11-21 ENCOUNTER — Encounter (HOSPITAL_COMMUNITY): Payer: Non-veteran care

## 2016-11-21 NOTE — Progress Notes (Signed)
Pulmonary Individual Treatment Plan  Patient Details  Name: Claudia Wiley MRN: [id number] Date of Birth: 06/14/1956 Referring Provider:     Pulmonary Rehab Walk Test from 11/19/2016 in [place]  Referring Provider  Dr. Nelda Marseille      Initial Encounter Date:    Pulmonary Rehab Walk Test from 11/19/2016 in [place]  Date  11/19/16  Referring Provider  Dr. Nelda Marseille      Visit Diagnosis: Shortness of breath  Patient's Home Medications on Admission:   Current Outpatient Prescriptions:  .  acetaminophen (TYLENOL) 325 MG tablet, Take 650 mg by mouth every 6 (six) hours as needed. Patient used this medication for pain., Disp: , Rfl:  .  albuterol (PROVENTIL HFA;VENTOLIN HFA) 108 (90 Base) MCG/ACT inhaler, Inhale into the lungs every 6 (six) hours as needed for wheezing or shortness of breath., Disp: , Rfl:  .  amLODipine (NORVASC) 2.5 MG tablet, Take 2.5 mg by mouth daily., Disp: , Rfl:  .  budesonide-formoterol (SYMBICORT) 160-4.5 MCG/ACT inhaler, Inhale 1 puff into the lungs daily as needed., Disp: , Rfl:  .  cholecalciferol (VITAMIN D) 400 units TABS tablet, Take 800 Units by mouth., Disp: , Rfl:  .  cyanocobalamin 1000 MCG tablet, Take 1,000 mcg by mouth daily., Disp: , Rfl:  .  ENSURE (ENSURE), Take 237 mLs by mouth 3 (three) times daily between meals., Disp: , Rfl:  .  esomeprazole (NEXIUM) 40 MG capsule, Take 1 capsule (40 mg total) by mouth daily before breakfast. (Patient not taking: Reported on 11/11/2016), Disp: 30 capsule, Rfl: 0 .  hydrochlorothiazide (HYDRODIURIL) 25 MG tablet, Take 25 mg by mouth daily., Disp: , Rfl:  .  HYDROcodone-acetaminophen (NORCO) 5-325 MG per tablet, Take 1 tablet by mouth every 6 (six) hours as needed for pain. (Patient not taking: Reported on 11/11/2016), Disp: 13 tablet, Rfl: 0 .  lipase/protease/amylase (CREON) 12000 units CPEP capsule, Take 12,000 Units by mouth., Disp: , Rfl:  .  mirtazapine  (REMERON) 15 MG tablet, Take 15 mg by mouth at bedtime., Disp: , Rfl:  .  Multiple Vitamin (MULTIVITAMIN) tablet, Take 1 tablet by mouth daily., Disp: , Rfl:  .  naproxen sodium (ANAPROX) 220 MG tablet, Take 220 mg by mouth 2 (two) times daily with a meal., Disp: , Rfl:  .  pantoprazole (PROTONIX) 40 MG tablet, Take 40 mg by mouth daily., Disp: , Rfl:  .  pregabalin (LYRICA) 150 MG capsule, Take 150 mg by mouth 2 (two) times daily., Disp: , Rfl:  .  promethazine (PHENERGAN) 12.5 MG suppository, Place 1 suppository (12.5 mg total) rectally every 6 (six) hours as needed for nausea. (Patient not taking: Reported on 11/11/2016), Disp: 12 each, Rfl: 0 .  tiZANidine (ZANAFLEX) 4 MG capsule, Take 4 mg by mouth 3 (three) times daily., Disp: , Rfl:  .  Vitamin D, Ergocalciferol, (DRISDOL) 50000 UNITS CAPS, Take 50,000 Units by mouth., Disp: , Rfl:   Past Medical History: Past Medical History:  Diagnosis Date  . Arthritis   . COPD (chronic obstructive pulmonary disease) ([place])   . Hypertension   . Pneumonia   . Weight loss, unintentional     Tobacco Use: History  Smoking Status  . Current Every Day Smoker  . Packs/day: 0.25  . Years: 2.00  . Types: Cigarettes  Smokeless Tobacco  . Never Used    Comment: Not ready    Labs: Recent Review Flowsheet Data    Labs for ITP  Cardiac and Pulmonary Rehab Latest Ref Rng & Units 10/19/2011   Hemoglobin A1c <5.7 % 5.3      Capillary Blood Glucose: No results found for: GLUCAP   ADL UCSD:   Pulmonary Function Assessment:     Pulmonary Function Assessment - 11/11/16 1056      Breath   Bilateral Breath Sounds Clear   Shortness of Breath Yes;Limiting activity      Exercise Target Goals: Date: 11/19/16  Exercise Program Goal: Individual exercise prescription set with THRR, safety & activity barriers. Participant demonstrates ability to understand and report RPE using BORG scale, to self-measure pulse accurately, and to acknowledge the  importance of the exercise prescription.  Exercise Prescription Goal: Starting with aerobic activity 30 plus minutes a day, 3 days per week for initial exercise prescription. Provide home exercise prescription and guidelines that participant acknowledges understanding prior to discharge.  Activity Barriers & Risk Stratification:   6 Minute Walk:     6 Minute Walk    Row Name 11/21/16 0652         6 Minute Walk   Phase Initial     Distance 1000 feet     Walk Time 6 minutes     # of Rest Breaks 0     MPH 1.89     METS 2.45     RPE 12     Perceived Dyspnea  1     Symptoms Yes (comment)     Comments leg fatigue     Resting HR 98 bpm     Resting BP 100/80     Max Ex. HR 105 bpm     Max Ex. BP 124/80       Interval HR   Baseline HR 98     1 Minute HR 102     2 Minute HR 102     3 Minute HR 104     4 Minute HR 101     5 Minute HR 101     6 Minute HR 105     2 Minute Post HR 98     Interval Heart Rate? Yes       Interval Oxygen   Interval Oxygen? Yes     Baseline Oxygen Saturation % 97 %     Baseline Liters of Oxygen 0 L     1 Minute Oxygen Saturation % 100 %     1 Minute Liters of Oxygen 0 L     2 Minute Oxygen Saturation % 97 %     2 Minute Liters of Oxygen 0 L     3 Minute Oxygen Saturation % 95 %     3 Minute Liters of Oxygen 0 L     4 Minute Oxygen Saturation % 95 %     4 Minute Liters of Oxygen 0 L     5 Minute Oxygen Saturation % 95 %     5 Minute Liters of Oxygen 0 L     6 Minute Oxygen Saturation % 95 %     6 Minute Liters of Oxygen 0 L     2 Minute Post Oxygen Saturation % 96 %     2 Minute Post Liters of Oxygen 0 L        Oxygen Initial Assessment:     Oxygen Initial Assessment - 11/21/16 0654      Initial 6 min Walk   Oxygen Used None   Resting Oxygen Saturation  during 6 min walk 97 %  Exercise Oxygen Saturation  during 6 min walk 95 %     Program Oxygen Prescription   Program Oxygen Prescription None      Oxygen  Re-Evaluation:   Oxygen Discharge (Final Oxygen Re-Evaluation):   Initial Exercise Prescription:     Initial Exercise Prescription - 11/21/16 0600      Date of Initial Exercise RX and Referring Provider   Date 11/19/16   Referring Provider Dr. Nelda Marseille     Bike   Level 0.3   Minutes 17     NuStep   Level 1   Minutes 17   METs 1.5     Track   Laps 5   Minutes 17     Prescription Details   Frequency (times per week) 2   Duration Progress to 45 minutes of aerobic exercise without signs/symptoms of physical distress     Intensity   THRR 40-80% of Max Heartrate 65-130   Ratings of Perceived Exertion 11-13   Perceived Dyspnea 0-4     Progression   Progression Continue progressive overload as per policy without signs/symptoms or physical distress.     Resistance Training   Training Prescription Yes   Weight orange bands   Reps 10-15      Perform Capillary Blood Glucose checks as needed.  Exercise Prescription Changes:   Exercise Comments:   Exercise Goals and Review:   Exercise Goals Re-Evaluation :   Discharge Exercise Prescription (Final Exercise Prescription Changes):   Nutrition:  Target Goals: Understanding of nutrition guidelines, daily intake of sodium 1500mg , cholesterol 200mg , calories 30% from fat and 7% or less from saturated fats, daily to have 5 or more servings of fruits and vegetables.  Biometrics:     Pre Biometrics - 11/11/16 1059      Pre Biometrics   Grip Strength 29 kg       Nutrition Therapy Plan and Nutrition Goals:   Nutrition Discharge: Rate Your Plate Scores:   Nutrition Goals Re-Evaluation:   Nutrition Goals Discharge (Final Nutrition Goals Re-Evaluation):   Psychosocial: Target Goals: Acknowledge presence or absence of significant depression and/or stress, maximize coping skills, provide positive support system. Participant is able to verbalize types and ability to use techniques and skills needed for  reducing stress and depression.  Initial Review & Psychosocial Screening:     Initial Psych Review & Screening - 11/11/16 1104      Initial Review   Current issues with None Identified     Family Dynamics   Good Support System? Yes     Barriers   Psychosocial barriers to participate in program There are no identifiable barriers or psychosocial needs.     Screening Interventions   Interventions Encouraged to exercise      Quality of Life Scores:   PHQ-9: Recent Review Flowsheet Data    Depression screen [facility] 2/9 11/11/2016   Decreased Interest 0   Down, Depressed, Hopeless 0   PHQ - 2 Score 0     Interpretation of Total Score  Total Score Depression Severity:  1-4 = Minimal depression, 5-9 = Mild depression, 10-14 = Moderate depression, 15-19 = Moderately severe depression, 20-27 = Severe depression   Psychosocial Evaluation and Intervention:     Psychosocial Evaluation - 11/11/16 1105      Psychosocial Evaluation & Interventions   Interventions Encouraged to exercise with the program and follow exercise prescription   Continue Psychosocial Services  No Follow up required      Psychosocial Re-Evaluation:  Psychosocial Discharge (Final Psychosocial Re-Evaluation):   Education: Education Goals: Education classes will be provided on a weekly basis, covering required topics. Participant will state understanding/return demonstration of topics presented.  Learning Barriers/Preferences:     Learning Barriers/Preferences - 11/11/16 1056      Learning Barriers/Preferences   Learning Barriers None   Learning Preferences Individual Instruction;Skilled Demonstration      Education Topics: Risk Factor Reduction:  -Group instruction that is supported by a PowerPoint presentation. Instructor discusses the definition of a risk factor, different risk factors for pulmonary disease, and how the heart and lungs work together.     Nutrition for Pulmonary Patient:   -Group instruction provided by PowerPoint slides, verbal discussion, and written materials to support subject matter. The instructor gives an explanation and review of healthy diet recommendations, which includes a discussion on weight management, recommendations for fruit and vegetable consumption, as well as protein, fluid, caffeine, fiber, sodium, sugar, and alcohol. Tips for eating when patients are short of breath are discussed.   Pursed Lip Breathing:  -Group instruction that is supported by demonstration and informational handouts. Instructor discusses the benefits of pursed lip and diaphragmatic breathing and detailed demonstration on how to preform both.     Oxygen Safety:  -Group instruction provided by PowerPoint, verbal discussion, and written material to support subject matter. There is an overview of "What is Oxygen" and "Why do we need it".  Instructor also reviews how to create a safe environment for oxygen use, the importance of using oxygen as prescribed, and the risks of noncompliance. There is a brief discussion on traveling with oxygen and resources the patient may utilize.   Oxygen Equipment:  -Group instruction provided by [facility] Staff utilizing handouts, written materials, and equipment demonstrations.   Signs and Symptoms:  -Group instruction provided by written material and verbal discussion to support subject matter. Warning signs and symptoms of infection, stroke, and heart attack are reviewed and when to call the physician/911 reinforced. Tips for preventing the spread of infection discussed.   Advanced Directives:  -Group instruction provided by verbal instruction and written material to support subject matter. Instructor reviews Advanced Directive laws and proper instruction for filling out document.   Pulmonary Video:  -Group video education that reviews the importance of medication and oxygen compliance, exercise, good nutrition, pulmonary hygiene, and  pursed lip and diaphragmatic breathing for the pulmonary patient.   Exercise for the Pulmonary Patient:  -Group instruction that is supported by a PowerPoint presentation. Instructor discusses benefits of exercise, core components of exercise, frequency, duration, and intensity of an exercise routine, importance of utilizing pulse oximetry during exercise, safety while exercising, and options of places to exercise outside of rehab.     Pulmonary Medications:  -Verbally interactive group education provided by instructor with focus on inhaled medications and proper administration.   Anatomy and Physiology of the Respiratory System and Intimacy:  -Group instruction provided by PowerPoint, verbal discussion, and written material to support subject matter. Instructor reviews respiratory cycle and anatomical components of the respiratory system and their functions. Instructor also reviews differences in obstructive and restrictive respiratory diseases with examples of each. Intimacy, Sex, and Sexuality differences are reviewed with a discussion on how relationships can change when diagnosed with pulmonary disease. Common sexual concerns are reviewed.   Knowledge Questionnaire Score:   Core Components/Risk Factors/Patient Goals at Admission:     Personal Goals and Risk Factors at Admission - 11/11/16 1059      Core Components/Risk Factors/Patient  Goals on Admission    Weight Management Weight Gain   Tobacco Cessation Yes   Number of packs per day .25 ppd   Intervention Assist the participant in steps to quit. Provide individualized education and counseling about committing to Tobacco Cessation, relapse prevention, and pharmacological support that can be provided by physician.;Advice worker, assist with locating and accessing local/national Quit Smoking programs, and support quit date choice.   Expected Outcomes Short Term: Will demonstrate readiness to quit, by selecting a quit  date.   Improve shortness of breath with ADL's Yes   Intervention Provide education, individualized exercise plan and daily activity instruction to help decrease symptoms of SOB with activities of daily living.   Expected Outcomes Short Term: Achieves a reduction of symptoms when performing activities of daily living.   Develop more efficient breathing techniques such as purse lipped breathing and diaphragmatic breathing; and practicing self-pacing with activity Yes   Intervention Provide education, demonstration and support about specific breathing techniuqes utilized for more efficient breathing. Include techniques such as pursed lipped breathing, diaphragmatic breathing and self-pacing activity.   Expected Outcomes Short Term: Participant will be able to demonstrate and use breathing techniques as needed throughout daily activities.   Increase knowledge of respiratory medications and ability to use respiratory devices properly  Yes   Intervention Provide education and demonstration as needed of appropriate use of medications, inhalers, and oxygen therapy.   Expected Outcomes Short Term: Achieves understanding of medications use. Understands that oxygen is a medication prescribed by physician. Demonstrates appropriate use of inhaler and oxygen therapy.      Core Components/Risk Factors/Patient Goals Review:    Core Components/Risk Factors/Patient Goals at Discharge (Final Review):    ITP Comments:   Comments:

## 2016-11-26 ENCOUNTER — Encounter (HOSPITAL_COMMUNITY)
Admission: RE | Admit: 2016-11-26 | Discharge: 2016-11-26 | Disposition: A | Discharge status: Home / Self Care | Payer: Non-veteran care | Source: Ambulatory Visit | Attending: Pulmonary Disease | Admitting: Pulmonary Disease

## 2016-11-26 DIAGNOSIS — R0602 Shortness of breath: Secondary | ICD-10-CM | POA: Diagnosis not present

## 2016-11-26 NOTE — Progress Notes (Signed)
Daily Session Note  Patient Details  Name: Ciena [name] MRN: [id number] Date of Birth: 1956/01/23 Referring Provider:     Pulmonary Rehab Walk Test from 11/19/2016 in [place]  Referring Provider  Dr. Nelda Marseille      Encounter Date: 11/26/2016  Check In:     Session Check In - 11/26/16 1330      Check-In   Location [facility]   Staff Present Su Hilt, MS, ACSM RCEP, Exercise Physiologist;Delos Klich Rollene Rotunda, RN, Roque Cash, RN   Supervising physician immediately available to respond to emergencies [facility] immediately available   Physician(s) Dr. Wendee Beavers   Medication changes reported     No   Fall or balance concerns reported    No   Tobacco Cessation No Change   Warm-up and Cool-down Performed as group-led instruction   Resistance Training Performed Yes   VAD Patient? No     Pain Assessment   Currently in Pain? No/denies   Multiple Pain Sites No      Capillary Blood Glucose: No results found for this or any previous visit (from the past 24 hour(s)).      Exercise Prescription Changes - 11/26/16 1500      Response to Exercise   Blood Pressure (Admit) 134/74   Blood Pressure (Exercise) 148/80   Blood Pressure (Exit) 118/80   Heart Rate (Admit) 96 bpm   Heart Rate (Exercise) 109 bpm   Heart Rate (Exit) 85 bpm   Oxygen Saturation (Admit) 93 %   Oxygen Saturation (Exercise) 94 %   Oxygen Saturation (Exit) 97 %   Rating of Perceived Exertion (Exercise) 13   Perceived Dyspnea (Exercise) 1   Duration Progress to 45 minutes of aerobic exercise without signs/symptoms of physical distress   Intensity Other (comment)  THRR 40-80%     Resistance Training   Training Prescription Yes   Weight orange bands   Reps 10-15     Bike   Level 0.3   Minutes 17     NuStep   Level 1   Minutes 17   METs 1.5     Track   Laps 14   Minutes 17      History  Smoking Status  . Current Every Day Smoker  .  Packs/day: 0.25  . Years: 2.00  . Types: Cigarettes  Smokeless Tobacco  . Never Used    Comment: Not ready    Goals Met:  Exercise tolerated well Queuing for purse lip breathing No report of cardiac concerns or symptoms Strength training completed today  Goals Unmet:  Not Applicable  Comments: Service time is from 1330 to 1515   Dr. Rush Farmer is Medical Director for Pulmonary Rehab at [facility].

## 2016-11-28 ENCOUNTER — Encounter (HOSPITAL_COMMUNITY)
Admission: RE | Admit: 2016-11-28 | Discharge: 2016-11-28 | Disposition: A | Discharge status: Home / Self Care | Payer: Non-veteran care | Source: Ambulatory Visit | Attending: Pulmonary Disease | Admitting: Pulmonary Disease

## 2016-11-28 VITALS — Wt 88.6 lb

## 2016-11-28 DIAGNOSIS — R0602 Shortness of breath: Secondary | ICD-10-CM

## 2016-11-28 NOTE — Progress Notes (Signed)
Daily Session Note  Patient Details  Name: Claudia Wiley MRN: [id number] Date of Birth: 05-09-1956 Referring Provider:     Pulmonary Rehab Walk Test from 11/19/2016 in [place]  Referring Provider  Dr. Nelda Marseille      Encounter Date: 11/28/2016  Check In:     Session Check In - 11/28/16 1341      Check-In   Location [facility]   Staff Present Rosebud Poles, RN, BSN;Molly diVincenzo, MS, ACSM RCEP, Exercise Physiologist;Lisa Ysidro Evert, RN   Supervising physician immediately available to respond to emergencies [facility] immediately available   Physician(s) Dr. Broadus John   Medication changes reported     No   Fall or balance concerns reported    No   Tobacco Cessation No Change   Warm-up and Cool-down Performed as group-led instruction   Resistance Training Performed Yes   VAD Patient? No     Pain Assessment   Currently in Pain? No/denies   Multiple Pain Sites No      Capillary Blood Glucose: No results found for this or any previous visit (from the past 24 hour(s)).      Exercise Prescription Changes - 11/28/16 1500      Response to Exercise   Blood Pressure (Admit) 114/64   Blood Pressure (Exercise) 124/84   Blood Pressure (Exit) 110/66   Heart Rate (Admit) 97 bpm   Heart Rate (Exercise) 107 bpm   Heart Rate (Exit) 88 bpm   Oxygen Saturation (Admit) 95 %   Oxygen Saturation (Exercise) 94 %   Oxygen Saturation (Exit) 96 %   Rating of Perceived Exertion (Exercise) 12   Perceived Dyspnea (Exercise) 0   Duration Progress to 45 minutes of aerobic exercise without signs/symptoms of physical distress   Intensity THRR unchanged     Progression   Progression Continue to progress workloads to maintain intensity without signs/symptoms of physical distress.     Resistance Training   Training Prescription Yes   Weight orange bands   Reps 10-15     Bike   Level 0.3   Minutes 17     Track   Laps 15   Minutes 17       History  Smoking Status  . Current Every Day Smoker  . Packs/day: 0.25  . Years: 2.00  . Types: Cigarettes  Smokeless Tobacco  . Never Used    Comment: Not ready    Goals Met:  Exercise tolerated well Strength training completed today  Goals Unmet:  Not Applicable  Comments: Service time is from 1330 to 1510    Dr. Rush Farmer is Medical Director for Pulmonary Rehab at [facility].

## 2016-12-03 ENCOUNTER — Encounter (HOSPITAL_COMMUNITY)
Admission: RE | Admit: 2016-12-03 | Discharge: 2016-12-03 | Disposition: A | Discharge status: Home / Self Care | Payer: Non-veteran care | Source: Ambulatory Visit | Attending: Pulmonary Disease | Admitting: Pulmonary Disease

## 2016-12-03 VITALS — Wt 97.4 lb

## 2016-12-03 DIAGNOSIS — R0602 Shortness of breath: Secondary | ICD-10-CM

## 2016-12-03 NOTE — Progress Notes (Signed)
Daily Session Note  Patient Details  Name: Claudia Wiley MRN: [id number] Date of Birth: [birth year] Referring Provider:     Pulmonary Rehab Walk Test from 11/19/2016 in [place]  Referring Provider  Dr. Nelda Marseille      Encounter Date: 12/03/2016  Check In:     Session Check In - 12/03/16 1330      Check-In   Location [facility]   Staff Present Rosebud Poles, RN, BSN;Molly diVincenzo, MS, ACSM RCEP, Exercise Physiologist;Lisa Ysidro Evert, RN;Idan Prime Rollene Rotunda, RN, BSN   Supervising physician immediately available to respond to emergencies [facility] immediately available   Physician(s) Dr. Broadus John   Medication changes reported     No   Fall or balance concerns reported    No   Tobacco Cessation No Change   Warm-up and Cool-down Performed as group-led instruction   Resistance Training Performed Yes   VAD Patient? No     Pain Assessment   Currently in Pain? No/denies   Multiple Pain Sites No      Capillary Blood Glucose: No results found for this or any previous visit (from the past 24 hour(s)).      Exercise Prescription Changes - 12/03/16 1543      Response to Exercise   Blood Pressure (Admit) 123/71   Blood Pressure (Exit) 108/70   Heart Rate (Admit) 94 bpm   Heart Rate (Exercise) 107 bpm   Heart Rate (Exit) 88 bpm   Oxygen Saturation (Admit) 96 %   Oxygen Saturation (Exercise) 95 %   Oxygen Saturation (Exit) 93 %   Rating of Perceived Exertion (Exercise) 13   Perceived Dyspnea (Exercise) 1   Duration Progress to 45 minutes of aerobic exercise without signs/symptoms of physical distress   Intensity THRR unchanged     Progression   Progression Continue to progress workloads to maintain intensity without signs/symptoms of physical distress.     Resistance Training   Training Prescription Yes   Weight orange bands   Reps 10-15     Bike   Level 0.5   Minutes 17     NuStep   Level 1   Minutes 17   METs 2.1     Track   Laps 18   Minutes 17      History  Smoking Status  . Current Every Day Smoker  . Packs/day: 0.25  . Years: 2.00  . Types: Cigarettes  Smokeless Tobacco  . Never Used    Comment: Not ready    Goals Met:  Independence with exercise equipment Improved SOB with ADL's Using PLB without cueing & demonstrates good technique Exercise tolerated well No report of cardiac concerns or symptoms Strength training completed today  Goals Unmet:  Not Applicable  Comments: Service time is from 1330 to 1500   Dr. Rush Farmer is Medical Director for Pulmonary Rehab at [facility].

## 2016-12-05 ENCOUNTER — Encounter (HOSPITAL_COMMUNITY)
Admission: RE | Admit: 2016-12-05 | Discharge: 2016-12-05 | Disposition: A | Discharge status: Home / Self Care | Payer: Non-veteran care | Source: Ambulatory Visit | Attending: Pulmonary Disease | Admitting: Pulmonary Disease

## 2016-12-05 VITALS — Wt 87.7 lb

## 2016-12-05 DIAGNOSIS — R0602 Shortness of breath: Secondary | ICD-10-CM | POA: Diagnosis not present

## 2016-12-05 NOTE — Progress Notes (Signed)
Daily Session Note  Patient Details  Name: Claudia Wiley MRN: [id number] Date of Birth: 1956/02/28 Referring Provider:     Pulmonary Rehab Walk Test from 11/19/2016 in [place]  Referring Provider  Dr. Nelda Marseille      Encounter Date: 12/05/2016  Check In:     Session Check In - 12/05/16 1330      Check-In   Location [facility]   Staff Present Rosebud Poles, RN, BSN;Ramon Dredge, RN, MHA;Carys Malina Rollene Rotunda, RN, BSN   Supervising physician immediately available to respond to emergencies [facility] immediately available   Physician(s) Dr Karleen Hampshire   Medication changes reported     No   Fall or balance concerns reported    No   Tobacco Cessation No Change   Warm-up and Cool-down Performed as group-led instruction   Resistance Training Performed Yes   VAD Patient? No     Pain Assessment   Currently in Pain? No/denies   Multiple Pain Sites No      Capillary Blood Glucose: No results found for this or any previous visit (from the past 24 hour(s)).      Exercise Prescription Changes - 12/05/16 1621      Response to Exercise   Blood Pressure (Admit) 112/70   Blood Pressure (Exercise) 126/86   Blood Pressure (Exit) 120/82   Heart Rate (Admit) 80 bpm   Heart Rate (Exercise) 99 bpm   Heart Rate (Exit) 89 bpm   Oxygen Saturation (Admit) 97 %   Oxygen Saturation (Exercise) 96 %   Oxygen Saturation (Exit) 98 %   Rating of Perceived Exertion (Exercise) 13   Perceived Dyspnea (Exercise) 1   Duration Progress to 45 minutes of aerobic exercise without signs/symptoms of physical distress   Intensity THRR unchanged     Progression   Progression Continue to progress workloads to maintain intensity without signs/symptoms of physical distress.     Resistance Training   Training Prescription Yes   Weight orange bands   Reps 10-15     Track   Laps 9   Minutes 17      History  Smoking Status  . Current Every Day Smoker  .  Packs/day: 0.25  . Years: 2.00  . Types: Cigarettes  Smokeless Tobacco  . Never Used    Comment: Not ready    Goals Met:  Exercise tolerated well Queuing for purse lip breathing No report of cardiac concerns or symptoms Strength training completed today  Goals Unmet:  Not Applicable  Comments: Service time is from 1330 to 1530   Dr. Rush Farmer is Medical Director for Pulmonary Rehab at [facility].

## 2016-12-10 ENCOUNTER — Encounter (HOSPITAL_COMMUNITY): Payer: Non-veteran care

## 2016-12-12 ENCOUNTER — Encounter (HOSPITAL_COMMUNITY)
Admission: RE | Admit: 2016-12-12 | Discharge: 2016-12-12 | Disposition: A | Discharge status: Home / Self Care | Payer: Non-veteran care | Source: Ambulatory Visit | Attending: Pulmonary Disease | Admitting: Pulmonary Disease

## 2016-12-12 VITALS — Wt 87.7 lb

## 2016-12-12 DIAGNOSIS — R0602 Shortness of breath: Secondary | ICD-10-CM

## 2016-12-12 NOTE — Progress Notes (Signed)
Daily Session Note  Patient Details  Name: Claudia Wiley MRN: [id number] Date of Birth: June 24, 1956 Referring Provider:     Pulmonary Rehab Walk Test from 11/19/2016 in [place]  Referring Provider  Dr. Nelda Marseille      Encounter Date: 12/12/2016  Check In:     Session Check In - 12/12/16 1353      Check-In   Location [facility]   Staff Present Trish Fountain, RN, BSN;Izyk Marty, MS, ACSM RCEP, Exercise Physiologist   Supervising physician immediately available to respond to emergencies [facility] immediately available   Physician(s) Dr. Broadus John   Medication changes reported     No   Fall or balance concerns reported    No   Tobacco Cessation No Change   Warm-up and Cool-down Performed as group-led instruction   Resistance Training Performed Yes   VAD Patient? No     Pain Assessment   Currently in Pain? No/denies   Multiple Pain Sites No      Capillary Blood Glucose: No results found for this or any previous visit (from the past 24 hour(s)).      Exercise Prescription Changes - 12/12/16 1600      Response to Exercise   Blood Pressure (Admit) 150/80   Blood Pressure (Exercise) 104/74   Blood Pressure (Exit) 124/78   Heart Rate (Admit) 108 bpm   Heart Rate (Exercise) 105 bpm   Heart Rate (Exit) 90 bpm   Oxygen Saturation (Admit) 95 %   Oxygen Saturation (Exercise) 94 %   Oxygen Saturation (Exit) 99 %   Rating of Perceived Exertion (Exercise) 13   Perceived Dyspnea (Exercise) 1   Duration Progress to 45 minutes of aerobic exercise without signs/symptoms of physical distress   Intensity THRR unchanged     Progression   Progression Continue to progress workloads to maintain intensity without signs/symptoms of physical distress.     Resistance Training   Training Prescription Yes   Weight orange bands   Reps 10-15     Bike   Level 0.5   Minutes 17     NuStep   Level 2   Minutes 17   METs 2.4       History  Smoking Status  . Current Every Day Smoker  . Packs/day: 0.25  . Years: 2.00  . Types: Cigarettes  Smokeless Tobacco  . Never Used    Comment: Not ready    Goals Met:  Exercise tolerated well No report of cardiac concerns or symptoms Strength training completed today  Goals Unmet:  Not Applicable  Comments: Service time is from 1:30p to 3:10p    Dr. Rush Farmer is Medical Director for Pulmonary Rehab at [facility].

## 2016-12-17 ENCOUNTER — Encounter (HOSPITAL_COMMUNITY)
Admission: RE | Admit: 2016-12-17 | Discharge: 2016-12-17 | Disposition: A | Discharge status: Home / Self Care | Payer: Non-veteran care | Source: Ambulatory Visit | Attending: Pulmonary Disease | Admitting: Pulmonary Disease

## 2016-12-17 VITALS — Wt 88.4 lb

## 2016-12-17 DIAGNOSIS — R0602 Shortness of breath: Secondary | ICD-10-CM

## 2016-12-17 NOTE — Progress Notes (Signed)
Daily Session Note  Patient Details  Name: Claudia Wiley MRN: [id number] Date of Birth: 1955/10/19 Referring Provider:     Pulmonary Rehab Walk Test from 11/19/2016 in [place]  Referring Provider  Dr. Nelda Marseille      Encounter Date: 12/17/2016  Check In:     Session Check In - 12/17/16 1557      Check-In   Location [facility]   Staff Present Su Hilt, MS, ACSM RCEP, Exercise Physiologist;Lisa Ysidro Evert, RN   Supervising physician immediately available to respond to emergencies [facility] immediately available   Physician(s) Dr. Broadus John   Medication changes reported     No   Fall or balance concerns reported    No   Tobacco Cessation No Change   Warm-up and Cool-down Performed as group-led instruction   Resistance Training Performed Yes   VAD Patient? No     Pain Assessment   Currently in Pain? No/denies   Multiple Pain Sites No      Capillary Blood Glucose: No results found for this or any previous visit (from the past 24 hour(s)).      Exercise Prescription Changes - 12/17/16 1605      Response to Exercise   Blood Pressure (Admit) 110/70   Blood Pressure (Exercise) 110/60   Blood Pressure (Exit) 128/74   Heart Rate (Admit) 109 bpm   Heart Rate (Exercise) 109 bpm   Heart Rate (Exit) 90 bpm   Oxygen Saturation (Admit) 92 %   Oxygen Saturation (Exercise) 95 %   Oxygen Saturation (Exit) 92 %   Rating of Perceived Exertion (Exercise) 13   Perceived Dyspnea (Exercise) 1   Duration Progress to 45 minutes of aerobic exercise without signs/symptoms of physical distress   Intensity THRR unchanged     Progression   Progression Continue to progress workloads to maintain intensity without signs/symptoms of physical distress.     Resistance Training   Training Prescription Yes   Weight orange bands   Reps 10-15     Bike   Level 0.8   Minutes 17     NuStep   Level 3   Minutes 17   METs 2.3     Track   Laps 20   Minutes 17      History  Smoking Status  . Current Every Day Smoker  . Packs/day: 0.25  . Years: 2.00  . Types: Cigarettes  Smokeless Tobacco  . Never Used    Comment: Not ready    Goals Met:  Exercise tolerated well No report of cardiac concerns or symptoms Strength training completed today  Goals Unmet:  Not Applicable  Comments: Service time is from 1330 to 1500    Dr. Rush Farmer is Medical Director for Pulmonary Rehab at [facility].

## 2016-12-19 ENCOUNTER — Encounter (HOSPITAL_COMMUNITY)
Admission: RE | Admit: 2016-12-19 | Discharge: 2016-12-19 | Disposition: A | Discharge status: Home / Self Care | Payer: Non-veteran care | Source: Ambulatory Visit | Attending: Pulmonary Disease | Admitting: Pulmonary Disease

## 2016-12-19 DIAGNOSIS — R0602 Shortness of breath: Secondary | ICD-10-CM

## 2016-12-19 NOTE — Progress Notes (Signed)
Pulmonary Individual Treatment Plan  Patient Details  Name: Claudia Wiley MRN: [id number] Date of Birth: August 03, 1955 Referring Provider:     Pulmonary Rehab Walk Test from 11/19/2016 in [place]  Referring Provider  Dr. Nelda Marseille      Initial Encounter Date:    Pulmonary Rehab Walk Test from 11/19/2016 in [place]  Date  11/19/16  Referring Provider  Dr. Nelda Marseille      Visit Diagnosis: Shortness of breath  Patient's Home Medications on Admission:   Current Outpatient Prescriptions:  .  acetaminophen (TYLENOL) 325 MG tablet, Take 650 mg by mouth every 6 (six) hours as needed. Patient used this medication for pain., Disp: , Rfl:  .  albuterol (PROVENTIL HFA;VENTOLIN HFA) 108 (90 Base) MCG/ACT inhaler, Inhale into the lungs every 6 (six) hours as needed for wheezing or shortness of breath., Disp: , Rfl:  .  amLODipine (NORVASC) 2.5 MG tablet, Take 2.5 mg by mouth daily., Disp: , Rfl:  .  budesonide-formoterol (SYMBICORT) 160-4.5 MCG/ACT inhaler, Inhale 1 puff into the lungs daily as needed., Disp: , Rfl:  .  cholecalciferol (VITAMIN D) 400 units TABS tablet, Take 800 Units by mouth., Disp: , Rfl:  .  cyanocobalamin 1000 MCG tablet, Take 1,000 mcg by mouth daily., Disp: , Rfl:  .  ENSURE (ENSURE), Take 237 mLs by mouth 3 (three) times daily between meals., Disp: , Rfl:  .  esomeprazole (NEXIUM) 40 MG capsule, Take 1 capsule (40 mg total) by mouth daily before breakfast. (Patient not taking: Reported on 11/11/2016), Disp: 30 capsule, Rfl: 0 .  hydrochlorothiazide (HYDRODIURIL) 25 MG tablet, Take 25 mg by mouth daily., Disp: , Rfl:  .  HYDROcodone-acetaminophen (NORCO) 5-325 MG per tablet, Take 1 tablet by mouth every 6 (six) hours as needed for pain. (Patient not taking: Reported on 11/11/2016), Disp: 13 tablet, Rfl: 0 .  lipase/protease/amylase (CREON) 12000 units CPEP capsule, Take 12,000 Units by mouth., Disp: , Rfl:  .  mirtazapine  (REMERON) 15 MG tablet, Take 15 mg by mouth at bedtime., Disp: , Rfl:  .  Multiple Vitamin (MULTIVITAMIN) tablet, Take 1 tablet by mouth daily., Disp: , Rfl:  .  naproxen sodium (ANAPROX) 220 MG tablet, Take 220 mg by mouth 2 (two) times daily with a meal., Disp: , Rfl:  .  pantoprazole (PROTONIX) 40 MG tablet, Take 40 mg by mouth daily., Disp: , Rfl:  .  pregabalin (LYRICA) 150 MG capsule, Take 150 mg by mouth 2 (two) times daily., Disp: , Rfl:  .  promethazine (PHENERGAN) 12.5 MG suppository, Place 1 suppository (12.5 mg total) rectally every 6 (six) hours as needed for nausea. (Patient not taking: Reported on 11/11/2016), Disp: 12 each, Rfl: 0 .  tiZANidine (ZANAFLEX) 4 MG capsule, Take 4 mg by mouth 3 (three) times daily., Disp: , Rfl:  .  Vitamin D, Ergocalciferol, (DRISDOL) 50000 UNITS CAPS, Take 50,000 Units by mouth., Disp: , Rfl:   Past Medical History: Past Medical History:  Diagnosis Date  . Arthritis   . COPD (chronic obstructive pulmonary disease) ([place])   . Hypertension   . Pneumonia   . Weight loss, unintentional     Tobacco Use: History  Smoking Status  . Current Every Day Smoker  . Packs/day: 0.25  . Years: 2.00  . Types: Cigarettes  Smokeless Tobacco  . Never Used    Comment: Not ready    Labs: Recent Review Flowsheet Data    Labs for ITP  Cardiac and Pulmonary Rehab Latest Ref Rng & Units 10/19/2011   Hemoglobin A1c <5.7 % 5.3      Capillary Blood Glucose: No results found for: GLUCAP   ADL UCSD:     Pulmonary Assessment Scores    Row Name 12/18/16 1417         ADL UCSD   ADL Phase Entry     SOB Score total 48       CAT Score   CAT Score 19  Pre        Pulmonary Function Assessment:     Pulmonary Function Assessment - 11/11/16 1056      Breath   Bilateral Breath Sounds Clear   Shortness of Breath Yes;Limiting activity      Exercise Target Goals:    Exercise Program Goal: Individual exercise prescription set with THRR, safety &  activity barriers. Participant demonstrates ability to understand and report RPE using BORG scale, to self-measure pulse accurately, and to acknowledge the importance of the exercise prescription.  Exercise Prescription Goal: Starting with aerobic activity 30 plus minutes a day, 3 days per week for initial exercise prescription. Provide home exercise prescription and guidelines that participant acknowledges understanding prior to discharge.  Activity Barriers & Risk Stratification:   6 Minute Walk:     6 Minute Walk    Row Name 11/21/16 0652         6 Minute Walk   Phase Initial     Distance 1000 feet     Walk Time 6 minutes     # of Rest Breaks 0     MPH 1.89     METS 2.45     RPE 12     Perceived Dyspnea  1     Symptoms Yes (comment)     Comments leg fatigue     Resting HR 98 bpm     Resting BP 100/80     Max Ex. HR 105 bpm     Max Ex. BP 124/80       Interval HR   Baseline HR 98     1 Minute HR 102     2 Minute HR 102     3 Minute HR 104     4 Minute HR 101     5 Minute HR 101     6 Minute HR 105     2 Minute Post HR 98     Interval Heart Rate? Yes       Interval Oxygen   Interval Oxygen? Yes     Baseline Oxygen Saturation % 97 %     Baseline Liters of Oxygen 0 L     1 Minute Oxygen Saturation % 100 %     1 Minute Liters of Oxygen 0 L     2 Minute Oxygen Saturation % 97 %     2 Minute Liters of Oxygen 0 L     3 Minute Oxygen Saturation % 95 %     3 Minute Liters of Oxygen 0 L     4 Minute Oxygen Saturation % 95 %     4 Minute Liters of Oxygen 0 L     5 Minute Oxygen Saturation % 95 %     5 Minute Liters of Oxygen 0 L     6 Minute Oxygen Saturation % 95 %     6 Minute Liters of Oxygen 0 L     2 Minute Post Oxygen Saturation % 96 %  2 Minute Post Liters of Oxygen 0 L        Oxygen Initial Assessment:     Oxygen Initial Assessment - 11/21/16 0654      Initial 6 min Walk   Oxygen Used None   Resting Oxygen Saturation  during 6 min walk 97 %    Exercise Oxygen Saturation  during 6 min walk 95 %     Program Oxygen Prescription   Program Oxygen Prescription None      Oxygen Re-Evaluation:     Oxygen Re-Evaluation    Row Name 12/19/16 0954             Program Oxygen Prescription   Program Oxygen Prescription None         Home Oxygen   Home Oxygen Device None       Sleep Oxygen Prescription None       Home Exercise Oxygen Prescription None       Home at Rest Exercise Oxygen Prescription None          Oxygen Discharge (Final Oxygen Re-Evaluation):     Oxygen Re-Evaluation - 12/19/16 0954      Program Oxygen Prescription   Program Oxygen Prescription None     Home Oxygen   Home Oxygen Device None   Sleep Oxygen Prescription None   Home Exercise Oxygen Prescription None   Home at Rest Exercise Oxygen Prescription None      Initial Exercise Prescription:     Initial Exercise Prescription - 11/21/16 0600      Date of Initial Exercise RX and Referring Provider   Date 11/19/16   Referring Provider Dr. Nelda Marseille     Bike   Level 0.3   Minutes 17     NuStep   Level 1   Minutes 17   METs 1.5     Track   Laps 5   Minutes 17     Prescription Details   Frequency (times per week) 2   Duration Progress to 45 minutes of aerobic exercise without signs/symptoms of physical distress     Intensity   THRR 40-80% of Max Heartrate 65-130   Ratings of Perceived Exertion 11-13   Perceived Dyspnea 0-4     Progression   Progression Continue progressive overload as per policy without signs/symptoms or physical distress.     Resistance Training   Training Prescription Yes   Weight orange bands   Reps 10-15      Perform Capillary Blood Glucose checks as needed.  Exercise Prescription Changes:     Exercise Prescription Changes    Row Name 11/26/16 1500 11/28/16 1500 12/03/16 1543 12/05/16 1621 12/12/16 1600     Response to Exercise   Blood Pressure (Admit) 134/74 114/64 123/71 112/70 150/80   Blood  Pressure (Exercise) 148/80 124/84  - 126/86 104/74   Blood Pressure (Exit) 118/80 110/66 108/70 120/82 124/78   Heart Rate (Admit) 96 bpm 97 bpm 94 bpm 80 bpm 108 bpm   Heart Rate (Exercise) 109 bpm 107 bpm 107 bpm 99 bpm 105 bpm   Heart Rate (Exit) 85 bpm 88 bpm 88 bpm 89 bpm 90 bpm   Oxygen Saturation (Admit) 93 % 95 % 96 % 97 % 95 %   Oxygen Saturation (Exercise) 94 % 94 % 95 % 96 % 94 %   Oxygen Saturation (Exit) 97 % 96 % 93 % 98 % 99 %   Rating of Perceived Exertion (Exercise) 13 12 13 13 13    Perceived  Dyspnea (Exercise) 1 0 1 1 1    Duration Progress to 45 minutes of aerobic exercise without signs/symptoms of physical distress Progress to 45 minutes of aerobic exercise without signs/symptoms of physical distress Progress to 45 minutes of aerobic exercise without signs/symptoms of physical distress Progress to 45 minutes of aerobic exercise without signs/symptoms of physical distress Progress to 45 minutes of aerobic exercise without signs/symptoms of physical distress   Intensity Other (comment)  THRR 40-80% THRR unchanged THRR unchanged THRR unchanged THRR unchanged     Progression   Progression  - Continue to progress workloads to maintain intensity without signs/symptoms of physical distress. Continue to progress workloads to maintain intensity without signs/symptoms of physical distress. Continue to progress workloads to maintain intensity without signs/symptoms of physical distress. Continue to progress workloads to maintain intensity without signs/symptoms of physical distress.     Resistance Training   Training Prescription Yes Yes Yes Yes Yes   Weight orange bands orange bands orange bands orange bands orange bands   Reps 10-15 10-15 10-15 10-15 10-15     Bike   Level 0.3 0.3 0.5  - 0.5   Minutes 17 17 17   - 17     NuStep   Level 1  - 1  - 2   Minutes 17  - 17  - 17   METs 1.5  - 2.1  - 2.4     Track   Laps 14 15 18 9   -   Minutes 17 17 17 17   -   Row Name 12/17/16  1605             Response to Exercise   Blood Pressure (Admit) 110/70       Blood Pressure (Exercise) 110/60       Blood Pressure (Exit) 128/74       Heart Rate (Admit) 109 bpm       Heart Rate (Exercise) 109 bpm       Heart Rate (Exit) 90 bpm       Oxygen Saturation (Admit) 92 %       Oxygen Saturation (Exercise) 95 %       Oxygen Saturation (Exit) 92 %       Rating of Perceived Exertion (Exercise) 13       Perceived Dyspnea (Exercise) 1       Duration Progress to 45 minutes of aerobic exercise without signs/symptoms of physical distress       Intensity THRR unchanged         Progression   Progression Continue to progress workloads to maintain intensity without signs/symptoms of physical distress.         Resistance Training   Training Prescription Yes       Weight orange bands       Reps 10-15         Bike   Level 0.8       Minutes 17         NuStep   Level 3       Minutes 17       METs 2.3         Track   Laps 20       Minutes 17          Exercise Comments:   Exercise Goals and Review:   Exercise Goals Re-Evaluation :     Exercise Goals Re-Evaluation    [place] Name 12/16/16 1154  Exercise Goal Re-Evaluation   Exercise Goals Review Increase Physical Activity;Increase Strenth and Stamina       Comments Patient is progressing well in program. She consistently rates her perceived exertion at an appropraite level for increasing physical capacity. Averages 15-18 laps (200 ft each). Will cont to monitor and progress as able.        Expected Outcomes Through exercising at rehab and at home, patient will increase physical strength and stamina and find ADL's easier to perform.           Discharge Exercise Prescription (Final Exercise Prescription Changes):     Exercise Prescription Changes - 12/17/16 1605      Response to Exercise   Blood Pressure (Admit) 110/70   Blood Pressure (Exercise) 110/60   Blood Pressure (Exit) 128/74   Heart Rate  (Admit) 109 bpm   Heart Rate (Exercise) 109 bpm   Heart Rate (Exit) 90 bpm   Oxygen Saturation (Admit) 92 %   Oxygen Saturation (Exercise) 95 %   Oxygen Saturation (Exit) 92 %   Rating of Perceived Exertion (Exercise) 13   Perceived Dyspnea (Exercise) 1   Duration Progress to 45 minutes of aerobic exercise without signs/symptoms of physical distress   Intensity THRR unchanged     Progression   Progression Continue to progress workloads to maintain intensity without signs/symptoms of physical distress.     Resistance Training   Training Prescription Yes   Weight orange bands   Reps 10-15     Bike   Level 0.8   Minutes 17     NuStep   Level 3   Minutes 17   METs 2.3     Track   Laps 20   Minutes 17      Nutrition:  Target Goals: Understanding of nutrition guidelines, daily intake of sodium 1500mg , cholesterol 200mg , calories 30% from fat and 7% or less from saturated fats, daily to have 5 or more servings of fruits and vegetables.  Biometrics:     Pre Biometrics - 11/11/16 1059      Pre Biometrics   Grip Strength 29 kg       Nutrition Therapy Plan and Nutrition Goals:   Nutrition Discharge: Rate Your Plate Scores:   Nutrition Goals Re-Evaluation:   Nutrition Goals Discharge (Final Nutrition Goals Re-Evaluation):   Psychosocial: Target Goals: Acknowledge presence or absence of significant depression and/or stress, maximize coping skills, provide positive support system. Participant is able to verbalize types and ability to use techniques and skills needed for reducing stress and depression.  Initial Review & Psychosocial Screening:     Initial Psych Review & Screening - 11/11/16 1104      Initial Review   Current issues with None Identified     Family Dynamics   Good Support System? Yes     Barriers   Psychosocial barriers to participate in program There are no identifiable barriers or psychosocial needs.     Screening Interventions    Interventions Encouraged to exercise      Quality of Life Scores:   PHQ-9: Recent Review Flowsheet Data    Depression screen [facility] 2/9 11/11/2016   Decreased Interest 0   Down, Depressed, Hopeless 0   PHQ - 2 Score 0     Interpretation of Total Score  Total Score Depression Severity:  1-4 = Minimal depression, 5-9 = Mild depression, 10-14 = Moderate depression, 15-19 = Moderately severe depression, 20-27 = Severe depression   Psychosocial Evaluation and Intervention:  Psychosocial Evaluation - 11/11/16 1105      Psychosocial Evaluation & Interventions   Interventions Encouraged to exercise with the program and follow exercise prescription   Continue Psychosocial Services  No Follow up required      Psychosocial Re-Evaluation:     Psychosocial Re-Evaluation    [place] Name 12/19/16 0956             Psychosocial Re-Evaluation   Current issues with None Identified       Interventions Encouraged to attend Pulmonary Rehabilitation for the exercise       Continue Psychosocial Services  No Follow up required          Psychosocial Discharge (Final Psychosocial Re-Evaluation):     Psychosocial Re-Evaluation - 12/19/16 0956      Psychosocial Re-Evaluation   Current issues with None Identified   Interventions Encouraged to attend Pulmonary Rehabilitation for the exercise   Continue Psychosocial Services  No Follow up required      Education: Education Goals: Education classes will be provided on a weekly basis, covering required topics. Participant will state understanding/return demonstration of topics presented.  Learning Barriers/Preferences:     Learning Barriers/Preferences - 11/11/16 1056      Learning Barriers/Preferences   Learning Barriers None   Learning Preferences Individual Instruction;Skilled Demonstration      Education Topics: Risk Factor Reduction:  -Group instruction that is supported by a PowerPoint presentation. Instructor discusses the  definition of a risk factor, different risk factors for pulmonary disease, and how the heart and lungs work together.     Nutrition for Pulmonary Patient:  -Group instruction provided by PowerPoint slides, verbal discussion, and written materials to support subject matter. The instructor gives an explanation and review of healthy diet recommendations, which includes a discussion on weight management, recommendations for fruit and vegetable consumption, as well as protein, fluid, caffeine, fiber, sodium, sugar, and alcohol. Tips for eating when patients are short of breath are discussed.   Pursed Lip Breathing:  -Group instruction that is supported by demonstration and informational handouts. Instructor discusses the benefits of pursed lip and diaphragmatic breathing and detailed demonstration on how to preform both.     Oxygen Safety:  -Group instruction provided by PowerPoint, verbal discussion, and written material to support subject matter. There is an overview of "What is Oxygen" and "Why do we need it".  Instructor also reviews how to create a safe environment for oxygen use, the importance of using oxygen as prescribed, and the risks of noncompliance. There is a brief discussion on traveling with oxygen and resources the patient may utilize.   Oxygen Equipment:  -Group instruction provided by [facility] Staff utilizing handouts, written materials, and equipment demonstrations.   PULMONARY REHAB OTHER RESPIRATORY from 12/12/2016 in [place]  Date  12/05/16  Educator  [facility]  Instruction Review Code  2- meets goals/outcomes      Signs and Symptoms:  -Group instruction provided by written material and verbal discussion to support subject matter. Warning signs and symptoms of infection, stroke, and heart attack are reviewed and when to call the physician/911 reinforced. Tips for preventing the spread of infection discussed.   Advanced  Directives:  -Group instruction provided by verbal instruction and written material to support subject matter. Instructor reviews Advanced Directive laws and proper instruction for filling out document.   Pulmonary Video:  -Group video education that reviews the importance of medication and oxygen compliance, exercise, good nutrition, pulmonary hygiene, and pursed  lip and diaphragmatic breathing for the pulmonary patient.   Exercise for the Pulmonary Patient:  -Group instruction that is supported by a PowerPoint presentation. Instructor discusses benefits of exercise, core components of exercise, frequency, duration, and intensity of an exercise routine, importance of utilizing pulse oximetry during exercise, safety while exercising, and options of places to exercise outside of rehab.     Pulmonary Medications:  -Verbally interactive group education provided by instructor with focus on inhaled medications and proper administration.   PULMONARY REHAB OTHER RESPIRATORY from 12/12/2016 in [place]  Date  11/28/16  Educator  Pharmacy  Instruction Review Code  2- meets goals/outcomes      Anatomy and Physiology of the Respiratory System and Intimacy:  -Group instruction provided by PowerPoint, verbal discussion, and written material to support subject matter. Instructor reviews respiratory cycle and anatomical components of the respiratory system and their functions. Instructor also reviews differences in obstructive and restrictive respiratory diseases with examples of each. Intimacy, Sex, and Sexuality differences are reviewed with a discussion on how relationships can change when diagnosed with pulmonary disease. Common sexual concerns are reviewed.   MD DAY -A group question and answer session with a medical doctor that allows participants to ask questions that relate to their pulmonary disease state.   OTHER EDUCATION -Group or individual verbal,  written, or video instructions that support the educational goals of the pulmonary rehab program.   Knowledge Questionnaire Score:     Knowledge Questionnaire Score - 12/18/16 1416      Knowledge Questionnaire Score   Pre Score 9/13      Core Components/Risk Factors/Patient Goals at Admission:     Personal Goals and Risk Factors at Admission - 11/11/16 1059      Core Components/Risk Factors/Patient Goals on Admission    Weight Management Weight Gain   Tobacco Cessation Yes   Number of packs per day .25 ppd   Intervention Assist the participant in steps to quit. Provide individualized education and counseling about committing to Tobacco Cessation, relapse prevention, and pharmacological support that can be provided by physician.;Advice worker, assist with locating and accessing local/national Quit Smoking programs, and support quit date choice.   Expected Outcomes Short Term: Will demonstrate readiness to quit, by selecting a quit date.   Improve shortness of breath with ADL's Yes   Intervention Provide education, individualized exercise plan and daily activity instruction to help decrease symptoms of SOB with activities of daily living.   Expected Outcomes Short Term: Achieves a reduction of symptoms when performing activities of daily living.   Develop more efficient breathing techniques such as purse lipped breathing and diaphragmatic breathing; and practicing self-pacing with activity Yes   Intervention Provide education, demonstration and support about specific breathing techniuqes utilized for more efficient breathing. Include techniques such as pursed lipped breathing, diaphragmatic breathing and self-pacing activity.   Expected Outcomes Short Term: Participant will be able to demonstrate and use breathing techniques as needed throughout daily activities.   Increase knowledge of respiratory medications and ability to use respiratory devices properly  Yes    Intervention Provide education and demonstration as needed of appropriate use of medications, inhalers, and oxygen therapy.   Expected Outcomes Short Term: Achieves understanding of medications use. Understands that oxygen is a medication prescribed by physician. Demonstrates appropriate use of inhaler and oxygen therapy.      Core Components/Risk Factors/Patient Goals Review:      Goals and Risk Factor Review  [place] Name 12/19/16 0954             Core Components/Risk Factors/Patient Goals Review   Personal Goals Review Improve shortness of breath with ADL's;Increase knowledge of respiratory medications and ability to use respiratory devices properly.;Develop more efficient breathing techniques such as purse lipped breathing and diaphragmatic breathing and practicing self-pacing with activity.  weight gain       Review Progressing well, equipment increases have been tolerated well, pleasant lady , seems to enjoy coming to class and exercising       Expected Outcomes Continue to progress          Core Components/Risk Factors/Patient Goals at Discharge (Final Review):      Goals and Risk Factor Review - 12/19/16 0954      Core Components/Risk Factors/Patient Goals Review   Personal Goals Review Improve shortness of breath with ADL's;Increase knowledge of respiratory medications and ability to use respiratory devices properly.;Develop more efficient breathing techniques such as purse lipped breathing and diaphragmatic breathing and practicing self-pacing with activity.  weight gain   Review Progressing well, equipment increases have been tolerated well, pleasant lady , seems to enjoy coming to class and exercising   Expected Outcomes Continue to progress      ITP Comments:   Comments: *ITP REVIEW Pt is making expected progress toward pulmonary rehab goals after completing 6 sessions. Recommend continued exercise, life style modification, education, and utilization of breathing  techniques to increase stamina and strength and decrease shortness of breath with exertion.

## 2016-12-24 ENCOUNTER — Encounter (HOSPITAL_COMMUNITY): Payer: Non-veteran care

## 2016-12-26 ENCOUNTER — Encounter (HOSPITAL_COMMUNITY): Payer: Non-veteran care

## 2016-12-31 ENCOUNTER — Encounter (HOSPITAL_COMMUNITY)
Admission: RE | Admit: 2016-12-31 | Discharge: 2016-12-31 | Disposition: A | Discharge status: Home / Self Care | Payer: Non-veteran care | Source: Ambulatory Visit | Attending: Pulmonary Disease | Admitting: Pulmonary Disease

## 2016-12-31 VITALS — Wt 87.3 lb

## 2016-12-31 DIAGNOSIS — R0602 Shortness of breath: Secondary | ICD-10-CM

## 2016-12-31 NOTE — Progress Notes (Signed)
Daily Session Note  Patient Details  Name: Claudia Wiley MRN: [id number] Date of Birth: 09/28/1955 Referring Provider:     Pulmonary Rehab Walk Test from 11/19/2016 in [facility]  Referring Provider  Dr. Yacoub      Encounter Date: 12/31/2016  Check In:     Session Check In - 12/31/16 1330      Check-In   Location [facility]   Staff Present Joan Behrens, RN, BSN;Molly diVincenzo, MS, ACSM RCEP, Exercise Physiologist;Portia Payne, RN, BSN   Supervising physician immediately available to respond to emergencies [facility] immediately available   Physician(s) Dr. Abrol   Medication changes reported     No   Fall or balance concerns reported    No   Tobacco Cessation No Change   Warm-up and Cool-down Performed as group-led instruction   Resistance Training Performed Yes   VAD Patient? No     Pain Assessment   Currently in Pain? No/denies   Multiple Pain Sites No      Capillary Blood Glucose: No results found for this or any previous visit (from the past 24 hour(s)).      Exercise Prescription Changes - 12/31/16 1500      Response to Exercise   Blood Pressure (Admit) 138/72   Blood Pressure (Exercise) 160/90   Blood Pressure (Exit) 108/72   Heart Rate (Admit) 106 bpm   Heart Rate (Exercise) 117 bpm   Heart Rate (Exit) 98 bpm   Oxygen Saturation (Admit) 98 %   Oxygen Saturation (Exercise) 97 %   Oxygen Saturation (Exit) 99 %   Rating of Perceived Exertion (Exercise) 13   Perceived Dyspnea (Exercise) 3   Duration Progress to 45 minutes of aerobic exercise without signs/symptoms of physical distress   Intensity THRR unchanged     Progression   Progression Continue to progress workloads to maintain intensity without signs/symptoms of physical distress.     Resistance Training   Training Prescription Yes   Weight orange bands   Reps 10-15   Time --  10 minutes     Interval Training   Interval Training No      Bike   Level 0.8   Minutes 17     NuStep   Level 3   Minutes 17   METs 2.1     Track   Laps 15   Minutes 17      History  Smoking Status  . Current Every Day Smoker  . Packs/day: 0.25  . Years: 2.00  . Types: Cigarettes  Smokeless Tobacco  . Never Used    Comment: Not ready    Goals Met:  Exercise tolerated well Strength training completed today  Goals Unmet:  Not Applicable  Comments: Service time is from 1330 to 1500    Dr. Wesam G. Yacoub is Medical Director for Pulmonary Rehab at [facility]. 

## 2017-01-02 ENCOUNTER — Encounter (HOSPITAL_COMMUNITY)
Admission: RE | Admit: 2017-01-02 | Discharge: 2017-01-02 | Disposition: A | Discharge status: Home / Self Care | Payer: Non-veteran care | Source: Ambulatory Visit | Attending: Pulmonary Disease | Admitting: Pulmonary Disease

## 2017-01-02 VITALS — Wt 87.5 lb

## 2017-01-02 DIAGNOSIS — R0602 Shortness of breath: Secondary | ICD-10-CM | POA: Diagnosis not present

## 2017-01-02 NOTE — Progress Notes (Signed)
Daily Session Note  Patient Details  Name: Claudia Wiley MRN: [id number] Date of Birth: [birth year] Referring Provider:     Pulmonary Rehab Walk Test from 11/19/2016 in [place]  Referring Provider  Dr. Nelda Marseille      Encounter Date: 01/02/2017  Check In:     Session Check In - 01/02/17 1344      Check-In   Location [facility]   Staff Present Rosebud Poles, RN, BSN;Molly diVincenzo, MS, ACSM RCEP, Exercise Physiologist;Zehra Rucci Rollene Rotunda, RN, BSN   Supervising physician immediately available to respond to emergencies [facility] immediately available   Physician(s) Dr. Allyson Sabal   Medication changes reported     No   Fall or balance concerns reported    No   Tobacco Cessation No Change   Warm-up and Cool-down Performed as group-led instruction   Resistance Training Performed Yes   VAD Patient? No     Pain Assessment   Currently in Pain? No/denies   Multiple Pain Sites No      Capillary Blood Glucose: No results found for this or any previous visit (from the past 24 hour(s)).      Exercise Prescription Changes - 01/02/17 1545      Response to Exercise   Blood Pressure (Admit) 118/90   Blood Pressure (Exercise) 150/76   Blood Pressure (Exit) 94/60   Heart Rate (Admit) 99 bpm   Heart Rate (Exercise) 111 bpm   Heart Rate (Exit) 90 bpm   Oxygen Saturation (Admit) 98 %   Oxygen Saturation (Exercise) 96 %   Oxygen Saturation (Exit) 95 %   Rating of Perceived Exertion (Exercise) 11   Perceived Dyspnea (Exercise) 1   Duration Progress to 45 minutes of aerobic exercise without signs/symptoms of physical distress   Intensity THRR unchanged     Progression   Progression Continue to progress workloads to maintain intensity without signs/symptoms of physical distress.     Resistance Training   Training Prescription Yes   Weight orange bands   Reps 10-15   Time --  10 minutes     Interval Training   Interval Training No     Bike   Level 0.8   Minutes 17     NuStep   Level 4   Minutes 17   METs 2.4      History  Smoking Status  . Current Every Day Smoker  . Packs/day: 0.25  . Years: 2.00  . Types: Cigarettes  Smokeless Tobacco  . Never Used    Comment: Not ready    Goals Met:  Independence with exercise equipment Improved SOB with ADL's Using PLB without cueing & demonstrates good technique Exercise tolerated well No report of cardiac concerns or symptoms Strength training completed today  Goals Unmet:  Not Applicable  Comments: Service time is from 1330 to 1520   Dr. Rush Farmer is Medical Director for Pulmonary Rehab at [facility].

## 2017-01-06 NOTE — Progress Notes (Signed)
I have reviewed a Home Exercise Prescription with [name] . Claudia Wiley is  currently exercising at home.  The patient was advised to walk 2-3 days a week for 30 minutes.  Jaeline and I discussed how to progress their exercise prescription.  The patient stated that their goals were to feel better so she can feel comfortable going out places.  The patient stated that they understand the exercise prescription.  We reviewed exercise guidelines, target heart rate during exercise, oxygen use, weather, home pulse oximeter, endpoints for exercise, and goals.  Patient is encouraged to come to me with any questions. I will continue to follow up with the patient to assist them with progression and safety.

## 2017-01-07 ENCOUNTER — Encounter (HOSPITAL_COMMUNITY)
Admission: RE | Admit: 2017-01-07 | Discharge: 2017-01-07 | Disposition: A | Discharge status: Home / Self Care | Payer: Non-veteran care | Source: Ambulatory Visit | Attending: Pulmonary Disease | Admitting: Pulmonary Disease

## 2017-01-07 DIAGNOSIS — R0602 Shortness of breath: Secondary | ICD-10-CM | POA: Diagnosis not present

## 2017-01-07 NOTE — Progress Notes (Signed)
Daily Session Note  Patient Details  Name: Claudia Wiley MRN: [id number] Date of Birth: 21-Mar-1956 Referring Provider:     Pulmonary Rehab Walk Test from 11/19/2016 in [place]  Referring Provider  Dr. Nelda Marseille      Encounter Date: 01/07/2017  Check In:     Session Check In - 01/07/17 1514      Check-In   Staff Present Rosebud Poles, RN, BSN;Molly diVincenzo, MS, ACSM RCEP, Exercise Physiologist;Kirstan Fentress Colletta [place], RN, [place] physician immediately available to respond to emergencies [facility] immediately available   Physician(s) Dr. Allyson Sabal   Medication changes reported     No   Fall or balance concerns reported    No   Tobacco Cessation No Change   Warm-up and Cool-down Performed as group-led instruction   Resistance Training Performed Yes   VAD Patient? No     Pain Assessment   Currently in Pain? No/denies   Multiple Pain Sites No      Capillary Blood Glucose: No results found for this or any previous visit (from the past 24 hour(s)).      Exercise Prescription Changes - 01/07/17 1500      Response to Exercise   Blood Pressure (Admit) 100/54   Blood Pressure (Exercise) 104/60   Blood Pressure (Exit) 104/60   Heart Rate (Admit) 97 bpm   Heart Rate (Exercise) 108 bpm   Heart Rate (Exit) 95 bpm   Oxygen Saturation (Admit) 97 %   Oxygen Saturation (Exercise) 96 %   Oxygen Saturation (Exit) 94 %   Rating of Perceived Exertion (Exercise) 11   Perceived Dyspnea (Exercise) 1   Duration Progress to 45 minutes of aerobic exercise without signs/symptoms of physical distress   Intensity THRR unchanged     Progression   Progression Continue to progress workloads to maintain intensity without signs/symptoms of physical distress.     Resistance Training   Training Prescription Yes   Weight orange bands   Reps 10-15   Time 10 Minutes     Interval Training   Interval Training No     Bike   Level 0.8   Minutes 17     NuStep   Level 4   Minutes 17   METs 2     Track   Laps 21   Minutes 17      History  Smoking Status  . Current Every Day Smoker  . Packs/day: 0.25  . Years: 2.00  . Types: Cigarettes  Smokeless Tobacco  . Never Used    Comment: Not ready    Goals Met:  Exercise tolerated well No report of cardiac concerns or symptoms Strength training completed today  Goals Unmet:  Not Applicable  Comments: Service time is from 1330 to 1500    Dr. Rush Farmer is Medical Director for Pulmonary Rehab at [facility].

## 2017-01-09 ENCOUNTER — Encounter (HOSPITAL_COMMUNITY)
Admission: RE | Admit: 2017-01-09 | Discharge: 2017-01-09 | Disposition: A | Discharge status: Home / Self Care | Payer: Non-veteran care | Source: Ambulatory Visit | Attending: Pulmonary Disease | Admitting: Pulmonary Disease

## 2017-01-09 VITALS — Wt 87.5 lb

## 2017-01-09 DIAGNOSIS — R0602 Shortness of breath: Secondary | ICD-10-CM

## 2017-01-09 NOTE — Progress Notes (Signed)
Daily Session Note  Patient Details  Name: Claudia Wiley MRN: [id number] Date of Birth: 25-Feb-1956 Referring Provider:     Pulmonary Rehab Walk Test from 11/19/2016 in [place]  Referring Provider  Dr. Nelda Marseille      Encounter Date: 01/09/2017  Check In:     Session Check In - 01/09/17 1355      Check-In   Location [facility]   Staff Present Su Hilt, MS, ACSM RCEP, Exercise Physiologist;Lisa Colletta [place], RN, [place] physician immediately available to respond to emergencies [facility] immediately available   Physician(s) Dr. Allyson Sabal   Medication changes reported     No   Fall or balance concerns reported    No   Tobacco Cessation No Change   Warm-up and Cool-down Performed as group-led instruction   Resistance Training Performed Yes   VAD Patient? No     Pain Assessment   Currently in Pain? No/denies   Multiple Pain Sites No      Capillary Blood Glucose: No results found for this or any previous visit (from the past 24 hour(s)).      Exercise Prescription Changes - 01/09/17 1500      Response to Exercise   Blood Pressure (Admit) 130/66   Blood Pressure (Exercise) 130/80   Blood Pressure (Exit) 104/70   Heart Rate (Admit) 98 bpm   Heart Rate (Exercise) 111 bpm   Heart Rate (Exit) 100 bpm   Oxygen Saturation (Admit) 96 %   Oxygen Saturation (Exercise) 96 %   Oxygen Saturation (Exit) 97 %   Rating of Perceived Exertion (Exercise) 11   Perceived Dyspnea (Exercise) 0   Duration Progress to 45 minutes of aerobic exercise without signs/symptoms of physical distress   Intensity THRR unchanged     Progression   Progression Continue to progress workloads to maintain intensity without signs/symptoms of physical distress.     Resistance Training   Training Prescription Yes   Weight orange bands   Reps 10-15   Time 10 Minutes     Interval Training   Interval Training No     NuStep   Level 6   Minutes 17   METs 2.6     Track   Laps 16   Minutes 17      History  Smoking Status  . Current Every Day Smoker  . Packs/day: 0.25  . Years: 2.00  . Types: Cigarettes  Smokeless Tobacco  . Never Used    Comment: Not ready    Goals Met:  Exercise tolerated well No report of cardiac concerns or symptoms Strength training completed today  Goals Unmet:  Not Applicable  Comments: Service time is from 1:30p to 3:30p    Dr. Rush Farmer is Medical Director for Pulmonary Rehab at [facility].

## 2017-01-14 ENCOUNTER — Encounter (HOSPITAL_COMMUNITY)
Admission: RE | Admit: 2017-01-14 | Discharge: 2017-01-14 | Disposition: A | Discharge status: Home / Self Care | Payer: Non-veteran care | Source: Ambulatory Visit | Attending: Pulmonary Disease | Admitting: Pulmonary Disease

## 2017-01-14 VITALS — Wt 87.5 lb

## 2017-01-14 DIAGNOSIS — R0602 Shortness of breath: Secondary | ICD-10-CM | POA: Diagnosis not present

## 2017-01-14 NOTE — Progress Notes (Signed)
Claudia Wiley 61 y.o. female             Nutrition Screen & Note 1. Shortness of breath    Past Medical History:  Diagnosis Date  . Arthritis   . COPD (chronic obstructive pulmonary disease) ([place])   . Hypertension   . Pneumonia   . Weight loss, unintentional    Meds reviewed. Remeron noted Ht: Ht Readings from Last 1 Encounters:  11/11/16 4\' 11"  (1.499 m)    Wt:  Wt Readings from Last 3 Encounters:  01/09/17 87 lb 8.4 oz (39.7 kg)  01/07/17 87 lb 8.4 oz (39.7 kg)  01/02/17 87 lb 8.4 oz (39.7 kg)    BMI: 17.7    Current tobacco use? Yes  Labs:  Lipid Panel  No results found for: CHOL, TRIG, HDL, CHOLHDL, VLDL, LDLCALC, LDLDIRECT  Lab Results  Component Value Date   HGBA1C 5.3 10/19/2011   Nutrition Note Spoke with pt. Pt is underweight; now with mild to moderate protein-calorie malnutrition. Per EMR, pt has gained 41 lb over the past 5 years (89% increase). Wt gain desirable and continued wt gain to 90-95 lb desired. Pt's Rate Your Plate results not reviewed with pt due to pt's need to gain wt. Pt states she drinks Ensure Plus 2 times a day instead of the prescribed 4 times/day. Pt c/o Ensure having a metallic after taste. Pt avoids many salty foods; uses canned/ convenience food infrequently. Pt reports she primarily eats fresh food. Pt's daughter is the primary cook and grocery shopper for pt. Pt educated re: High Calorie, High Protein diet. Pt expressed understanding of the information reviewed via feedback method.    Nutrition Diagnosis ? Food-and nutrition-related knowledge deficit related to lack of exposure to information as related to diagnosis of pulmonary disease ? Severe protein-calorie malnutrition as evidenced by h/o unintentional wt loss of 46% body weight and decreased food intake. Pt now with mild to moderate malnutrition at this time as evidenced by an increase body weight of 89%, increase muscle and fat tissue, and continued low BMI and decreased  appetite.  Nutrition Intervention ? Pt's individual nutrition plan and goals reviewed with pt. ? Pt given High Calorie, High Protein nutrition handouts ? Alternative supplement ideas given  Goal(s) 1. Identify food quantities necessary to achieve wt gain to a goal weight of 90-95 lb at graduation from pulmonary rehab. Plan:  Pt to attend Pulmonary Nutrition class Will provide client-centered nutrition education as part of interdisciplinary care.   Monitor and evaluate progress toward nutrition goal with team.  Monitor and Evaluate progress toward nutrition goal with team.   Derek Mound, M.Ed, RD, LDN, CDE 01/14/2017 2:27 PM

## 2017-01-14 NOTE — Progress Notes (Signed)
Pulmonary Individual Treatment Plan  Patient Details  Name: Claudia Wiley MRN: [id number] Date of Birth: 19-Mar-1956 Referring Provider:     Pulmonary Rehab Walk Test from 11/19/2016 in [facility]  Referring Provider  Dr. Nelda Marseille      Initial Encounter Date:    Pulmonary Rehab Walk Test from 11/19/2016 in [facility]  Date  11/19/16  Referring Provider  Dr. Nelda Marseille      Visit Diagnosis: Shortness of breath  Patient's Home Medications on Admission:   Current Outpatient Prescriptions:  .  acetaminophen (TYLENOL) 325 MG tablet, Take 650 mg by mouth every 6 (six) hours as needed. Patient used this medication for pain., Disp: , Rfl:  .  albuterol (PROVENTIL HFA;VENTOLIN HFA) 108 (90 Base) MCG/ACT inhaler, Inhale into the lungs every 6 (six) hours as needed for wheezing or shortness of breath., Disp: , Rfl:  .  amLODipine (NORVASC) 2.5 MG tablet, Take 2.5 mg by mouth daily., Disp: , Rfl:  .  budesonide-formoterol (SYMBICORT) 160-4.5 MCG/ACT inhaler, Inhale 1 puff into the lungs daily as needed., Disp: , Rfl:  .  cholecalciferol (VITAMIN D) 400 units TABS tablet, Take 800 Units by mouth., Disp: , Rfl:  .  cyanocobalamin 1000 MCG tablet, Take 1,000 mcg by mouth daily., Disp: , Rfl:  .  ENSURE (ENSURE), Take 237 mLs by mouth 3 (three) times daily between meals., Disp: , Rfl:  .  esomeprazole (NEXIUM) 40 MG capsule, Take 1 capsule (40 mg total) by mouth daily before breakfast. (Patient not taking: Reported on 11/11/2016), Disp: 30 capsule, Rfl: 0 .  hydrochlorothiazide (HYDRODIURIL) 25 MG tablet, Take 25 mg by mouth daily., Disp: , Rfl:  .  HYDROcodone-acetaminophen (NORCO) 5-325 MG per tablet, Take 1 tablet by mouth every 6 (six) hours as needed for pain. (Patient not taking: Reported on 11/11/2016), Disp: 13 tablet, Rfl: 0 .  lipase/protease/amylase (CREON) 12000 units CPEP capsule, Take 12,000 Units by mouth., Disp: , Rfl:  .  mirtazapine  (REMERON) 15 MG tablet, Take 15 mg by mouth at bedtime., Disp: , Rfl:  .  Multiple Vitamin (MULTIVITAMIN) tablet, Take 1 tablet by mouth daily., Disp: , Rfl:  .  naproxen sodium (ANAPROX) 220 MG tablet, Take 220 mg by mouth 2 (two) times daily with a meal., Disp: , Rfl:  .  pantoprazole (PROTONIX) 40 MG tablet, Take 40 mg by mouth daily., Disp: , Rfl:  .  pregabalin (LYRICA) 150 MG capsule, Take 150 mg by mouth 2 (two) times daily., Disp: , Rfl:  .  promethazine (PHENERGAN) 12.5 MG suppository, Place 1 suppository (12.5 mg total) rectally every 6 (six) hours as needed for nausea. (Patient not taking: Reported on 11/11/2016), Disp: 12 each, Rfl: 0 .  tiZANidine (ZANAFLEX) 4 MG capsule, Take 4 mg by mouth 3 (three) times daily., Disp: , Rfl:  .  Vitamin D, Ergocalciferol, (DRISDOL) 50000 UNITS CAPS, Take 50,000 Units by mouth., Disp: , Rfl:   Past Medical History: Past Medical History:  Diagnosis Date  . Arthritis   . COPD (chronic obstructive pulmonary disease) ([place])   . Hypertension   . Pneumonia   . Weight loss, unintentional     Tobacco Use: History  Smoking Status  . Current Every Day Smoker  . Packs/day: 0.25  . Years: 2.00  . Types: Cigarettes  Smokeless Tobacco  . Never Used    Comment: Not ready    Labs: Recent Review Flowsheet Data    Labs for ITP  Cardiac and Pulmonary Rehab Latest Ref Rng & Units 10/19/2011   Hemoglobin A1c <5.7 % 5.3      Capillary Blood Glucose: No results found for: GLUCAP   ADL UCSD:     Pulmonary Assessment Scores    Row Name 12/18/16 1417         ADL UCSD   ADL Phase Entry     SOB Score total 48       CAT Score   CAT Score 19  Pre        Pulmonary Function Assessment:     Pulmonary Function Assessment - 11/11/16 1056      Breath   Bilateral Breath Sounds Clear   Shortness of Breath Yes;Limiting activity      Exercise Target Goals:    Exercise Program Goal: Individual exercise prescription set with THRR, safety &  activity barriers. Participant demonstrates ability to understand and report RPE using BORG scale, to self-measure pulse accurately, and to acknowledge the importance of the exercise prescription.  Exercise Prescription Goal: Starting with aerobic activity 30 plus minutes a day, 3 days per week for initial exercise prescription. Provide home exercise prescription and guidelines that participant acknowledges understanding prior to discharge.  Activity Barriers & Risk Stratification:   6 Minute Walk:     6 Minute Walk    Row Name 11/21/16 0652         6 Minute Walk   Phase Initial     Distance 1000 feet     Walk Time 6 minutes     # of Rest Breaks 0     MPH 1.89     METS 2.45     RPE 12     Perceived Dyspnea  1     Symptoms Yes (comment)     Comments leg fatigue     Resting HR 98 bpm     Resting BP 100/80     Max Ex. HR 105 bpm     Max Ex. BP 124/80       Interval HR   Baseline HR 98     1 Minute HR 102     2 Minute HR 102     3 Minute HR 104     4 Minute HR 101     5 Minute HR 101     6 Minute HR 105     2 Minute Post HR 98     Interval Heart Rate? Yes       Interval Oxygen   Interval Oxygen? Yes     Baseline Oxygen Saturation % 97 %     Baseline Liters of Oxygen 0 L     1 Minute Oxygen Saturation % 100 %     1 Minute Liters of Oxygen 0 L     2 Minute Oxygen Saturation % 97 %     2 Minute Liters of Oxygen 0 L     3 Minute Oxygen Saturation % 95 %     3 Minute Liters of Oxygen 0 L     4 Minute Oxygen Saturation % 95 %     4 Minute Liters of Oxygen 0 L     5 Minute Oxygen Saturation % 95 %     5 Minute Liters of Oxygen 0 L     6 Minute Oxygen Saturation % 95 %     6 Minute Liters of Oxygen 0 L     2 Minute Post Oxygen Saturation % 96 %  2 Minute Post Liters of Oxygen 0 L        Oxygen Initial Assessment:     Oxygen Initial Assessment - 11/21/16 0654      Initial 6 min Walk   Oxygen Used None   Resting Oxygen Saturation  during 6 min walk 97 %    Exercise Oxygen Saturation  during 6 min walk 95 %     Program Oxygen Prescription   Program Oxygen Prescription None      Oxygen Re-Evaluation:     Oxygen Re-Evaluation    Row Name 12/19/16 0954 01/14/17 1559           Program Oxygen Prescription   Program Oxygen Prescription None None        Home Oxygen   Home Oxygen Device None None      Sleep Oxygen Prescription None None      Home Exercise Oxygen Prescription None None      Home at Rest Exercise Oxygen Prescription None None         Oxygen Discharge (Final Oxygen Re-Evaluation):     Oxygen Re-Evaluation - 01/14/17 1559      Program Oxygen Prescription   Program Oxygen Prescription None     Home Oxygen   Home Oxygen Device None   Sleep Oxygen Prescription None   Home Exercise Oxygen Prescription None   Home at Rest Exercise Oxygen Prescription None      Initial Exercise Prescription:     Initial Exercise Prescription - 11/21/16 0600      Date of Initial Exercise RX and Referring Provider   Date 11/19/16   Referring Provider Dr. Nelda Marseille     Bike   Level 0.3   Minutes 17     NuStep   Level 1   Minutes 17   METs 1.5     Track   Laps 5   Minutes 17     Prescription Details   Frequency (times per week) 2   Duration Progress to 45 minutes of aerobic exercise without signs/symptoms of physical distress     Intensity   THRR 40-80% of Max Heartrate 65-130   Ratings of Perceived Exertion 11-13   Perceived Dyspnea 0-4     Progression   Progression Continue progressive overload as per policy without signs/symptoms or physical distress.     Resistance Training   Training Prescription Yes   Weight orange bands   Reps 10-15      Perform Capillary Blood Glucose checks as needed.  Exercise Prescription Changes:     Exercise Prescription Changes    Row Name 11/26/16 1500 11/28/16 1500 12/03/16 1543 12/05/16 1621 12/12/16 1600     Response to Exercise   Blood Pressure (Admit) 134/74 114/64  123/71 112/70 150/80   Blood Pressure (Exercise) 148/80 124/84  - 126/86 104/74   Blood Pressure (Exit) 118/80 110/66 108/70 120/82 124/78   Heart Rate (Admit) 96 bpm 97 bpm 94 bpm 80 bpm 108 bpm   Heart Rate (Exercise) 109 bpm 107 bpm 107 bpm 99 bpm 105 bpm   Heart Rate (Exit) 85 bpm 88 bpm 88 bpm 89 bpm 90 bpm   Oxygen Saturation (Admit) 93 % 95 % 96 % 97 % 95 %   Oxygen Saturation (Exercise) 94 % 94 % 95 % 96 % 94 %   Oxygen Saturation (Exit) 97 % 96 % 93 % 98 % 99 %   Rating of Perceived Exertion (Exercise) 13 12 13 13 13    Perceived  Dyspnea (Exercise) 1 0 1 1 1    Duration Progress to 45 minutes of aerobic exercise without signs/symptoms of physical distress Progress to 45 minutes of aerobic exercise without signs/symptoms of physical distress Progress to 45 minutes of aerobic exercise without signs/symptoms of physical distress Progress to 45 minutes of aerobic exercise without signs/symptoms of physical distress Progress to 45 minutes of aerobic exercise without signs/symptoms of physical distress   Intensity Other (comment)  THRR 40-80% THRR unchanged THRR unchanged THRR unchanged THRR unchanged     Progression   Progression  - Continue to progress workloads to maintain intensity without signs/symptoms of physical distress. Continue to progress workloads to maintain intensity without signs/symptoms of physical distress. Continue to progress workloads to maintain intensity without signs/symptoms of physical distress. Continue to progress workloads to maintain intensity without signs/symptoms of physical distress.     Resistance Training   Training Prescription Yes Yes Yes Yes Yes   Weight orange bands orange bands orange bands orange bands orange bands   Reps 10-15 10-15 10-15 10-15 10-15     Bike   Level 0.3 0.3 0.5  - 0.5   Minutes 17 17 17   - 17     NuStep   Level 1  - 1  - 2   Minutes 17  - 17  - 17   METs 1.5  - 2.1  - 2.4     Track   Laps 14 15 18 9   -   Minutes 17 17 17  17   -   Row Name 12/17/16 1605 12/31/16 1500 01/02/17 1545 01/07/17 1500 01/09/17 1500     Response to Exercise   Blood Pressure (Admit) 110/70 138/72 118/90 100/54 130/66   Blood Pressure (Exercise) 110/60 160/90 150/76 104/60 130/80   Blood Pressure (Exit) 128/74 108/72 94/60 104/60 104/70   Heart Rate (Admit) 109 bpm 106 bpm 99 bpm 97 bpm 98 bpm   Heart Rate (Exercise) 109 bpm 117 bpm 111 bpm 108 bpm 111 bpm   Heart Rate (Exit) 90 bpm 98 bpm 90 bpm 95 bpm 100 bpm   Oxygen Saturation (Admit) 92 % 98 % 98 % 97 % 96 %   Oxygen Saturation (Exercise) 95 % 97 % 96 % 96 % 96 %   Oxygen Saturation (Exit) 92 % 99 % 95 % 94 % 97 %   Rating of Perceived Exertion (Exercise) 13 13 11 11 11    Perceived Dyspnea (Exercise) 1 3 1 1  0   Duration Progress to 45 minutes of aerobic exercise without signs/symptoms of physical distress Progress to 45 minutes of aerobic exercise without signs/symptoms of physical distress Progress to 45 minutes of aerobic exercise without signs/symptoms of physical distress Progress to 45 minutes of aerobic exercise without signs/symptoms of physical distress Progress to 45 minutes of aerobic exercise without signs/symptoms of physical distress   Intensity THRR unchanged THRR unchanged THRR unchanged THRR unchanged THRR unchanged     Progression   Progression Continue to progress workloads to maintain intensity without signs/symptoms of physical distress. Continue to progress workloads to maintain intensity without signs/symptoms of physical distress. Continue to progress workloads to maintain intensity without signs/symptoms of physical distress. Continue to progress workloads to maintain intensity without signs/symptoms of physical distress. Continue to progress workloads to maintain intensity without signs/symptoms of physical distress.     Resistance Training   Training Prescription Yes Yes Yes Yes Yes   Weight orange bands orange bands orange bands orange bands orange bands  Reps 10-15 10-15 10-15 10-15 10-15   Time  - -  10 minutes -  10 minutes 10 Minutes 10 Minutes     Interval Training   Interval Training  - No No No No     Bike   Level 0.8 0.8 0.8 0.8  -   Minutes 17 17 17 17   -     NuStep   Level 3 3 4 4 6    Minutes 17 17 17 17 17    METs 2.3 2.1 2.4 2 2.6     Track   Laps 20 15  - 21 16   Minutes 17 17  - 17 17     Home Exercise Plan   Plans to continue exercise at  -  - Home (comment)  -  -   Frequency  -  - Add 3 additional days to program exercise sessions.  -  -   Row Name 01/14/17 1529             Response to Exercise   Blood Pressure (Admit) 114/66       Blood Pressure (Exercise) 134/80       Blood Pressure (Exit) 100/60       Heart Rate (Admit) 103 bpm       Heart Rate (Exercise) 121 bpm       Heart Rate (Exit) 97 bpm       Oxygen Saturation (Admit) 97 %       Oxygen Saturation (Exercise) 95 %       Oxygen Saturation (Exit) 96 %       Rating of Perceived Exertion (Exercise) 11       Perceived Dyspnea (Exercise) 1       Duration Progress to 45 minutes of aerobic exercise without signs/symptoms of physical distress       Intensity THRR unchanged         Progression   Progression Continue to progress workloads to maintain intensity without signs/symptoms of physical distress.         Resistance Training   Training Prescription Yes       Weight orange bands       Reps 10-15       Time 10 Minutes         Interval Training   Interval Training No         Bike   Level 0.8       Minutes 17         NuStep   Level 4       Minutes 17       METs 1.2         Track   Laps 20       Minutes 17          Exercise Comments:     Exercise Comments    Row Name 01/06/17 0917           Exercise Comments Home exercise completed          Exercise Goals and Review:   Exercise Goals Re-Evaluation :     Exercise Goals Re-Evaluation    [place] Name 12/16/16 1154 01/13/17 1145           Exercise Goal  Re-Evaluation   Exercise Goals Review Increase Physical Activity;Increase Strenth and Stamina Increase Physical Activity;Increase Strenth and Stamina      Comments Patient is progressing well in program. She consistently rates her perceived exertion at an appropraite level for increasing  physical capacity. Averages 15-18 laps (200 ft each). Will cont to monitor and progress as able.  Patient is progressing well in program. She consistently rates her perceived exertion at an appropriate level for increasing physical capacity. Averages 15-18 laps (200 ft each). Will cont to monitor and progress as able.       Expected Outcomes Through exercising at rehab and at home, patient will increase physical strength and stamina and find ADL's easier to perform.  Through exercising at rehab and at home, patient will increase physical strength and stamina and find ADL's easier to perform.          Discharge Exercise Prescription (Final Exercise Prescription Changes):     Exercise Prescription Changes - 01/14/17 1529      Response to Exercise   Blood Pressure (Admit) 114/66   Blood Pressure (Exercise) 134/80   Blood Pressure (Exit) 100/60   Heart Rate (Admit) 103 bpm   Heart Rate (Exercise) 121 bpm   Heart Rate (Exit) 97 bpm   Oxygen Saturation (Admit) 97 %   Oxygen Saturation (Exercise) 95 %   Oxygen Saturation (Exit) 96 %   Rating of Perceived Exertion (Exercise) 11   Perceived Dyspnea (Exercise) 1   Duration Progress to 45 minutes of aerobic exercise without signs/symptoms of physical distress   Intensity THRR unchanged     Progression   Progression Continue to progress workloads to maintain intensity without signs/symptoms of physical distress.     Resistance Training   Training Prescription Yes   Weight orange bands   Reps 10-15   Time 10 Minutes     Interval Training   Interval Training No     Bike   Level 0.8   Minutes 17     NuStep   Level 4   Minutes 17   METs 1.2     Track    Laps 20   Minutes 17      Nutrition:  Target Goals: Understanding of nutrition guidelines, daily intake of sodium 1500mg , cholesterol 200mg , calories 30% from fat and 7% or less from saturated fats, daily to have 5 or more servings of fruits and vegetables.  Biometrics:     Pre Biometrics - 11/11/16 1059      Pre Biometrics   Grip Strength 29 kg       Nutrition Therapy Plan and Nutrition Goals:     Nutrition Therapy & Goals - 01/14/17 1539      Nutrition Therapy   Diet High Calorie, High Protein      Personal Nutrition Goals   Nutrition Goal Identify food quantities necessary to achieve wt gain to a goal weight of 90-95 lb at graduation from pulmonary rehab.     Intervention Plan   Intervention Prescribe, educate and counsel regarding individualized specific dietary modifications aiming towards targeted core components such as weight, hypertension, lipid management, diabetes, heart failure and other comorbidities.;Nutrition handout(s) given to patient.  High Calorie, High Protein Nutrition handouts given   Expected Outcomes Short Term Goal: Understand basic principles of dietary content, such as calories, fat, sodium, cholesterol and nutrients.;Long Term Goal: Adherence to prescribed nutrition plan.      Nutrition Discharge: Rate Your Plate Scores:   Nutrition Goals Re-Evaluation:   Nutrition Goals Discharge (Final Nutrition Goals Re-Evaluation):   Psychosocial: Target Goals: Acknowledge presence or absence of significant depression and/or stress, maximize coping skills, provide positive support system. Participant is able to verbalize types and ability to use techniques and skills needed for reducing  stress and depression.  Initial Review & Psychosocial Screening:     Initial Psych Review & Screening - 11/11/16 1104      Initial Review   Current issues with None Identified     Family Dynamics   Good Support System? Yes     Barriers   Psychosocial  barriers to participate in program There are no identifiable barriers or psychosocial needs.     Screening Interventions   Interventions Encouraged to exercise      Quality of Life Scores:   PHQ-9: Recent Review Flowsheet Data    Depression screen [facility] 2/9 11/11/2016   Decreased Interest 0   Down, Depressed, Hopeless 0   PHQ - 2 Score 0     Interpretation of Total Score  Total Score Depression Severity:  1-4 = Minimal depression, 5-9 = Mild depression, 10-14 = Moderate depression, 15-19 = Moderately severe depression, 20-27 = Severe depression   Psychosocial Evaluation and Intervention:     Psychosocial Evaluation - 11/11/16 1105      Psychosocial Evaluation & Interventions   Interventions Encouraged to exercise with the program and follow exercise prescription   Continue Psychosocial Services  No Follow up required      Psychosocial Re-Evaluation:     Psychosocial Re-Evaluation    [place] Name 12/19/16 0956 01/14/17 1601           Psychosocial Re-Evaluation   Current issues with None Identified None Identified      Interventions Encouraged to attend Pulmonary Rehabilitation for the exercise Encouraged to attend Pulmonary Rehabilitation for the exercise      Continue Psychosocial Services  No Follow up required No Follow up required         Psychosocial Discharge (Final Psychosocial Re-Evaluation):     Psychosocial Re-Evaluation - 01/14/17 1601      Psychosocial Re-Evaluation   Current issues with None Identified   Interventions Encouraged to attend Pulmonary Rehabilitation for the exercise   Continue Psychosocial Services  No Follow up required      Education: Education Goals: Education classes will be provided on a weekly basis, covering required topics. Participant will state understanding/return demonstration of topics presented.  Learning Barriers/Preferences:     Learning Barriers/Preferences - 11/11/16 1056      Learning Barriers/Preferences    Learning Barriers None   Learning Preferences Individual Instruction;Skilled Demonstration      Education Topics: Risk Factor Reduction:  -Group instruction that is supported by a PowerPoint presentation. Instructor discusses the definition of a risk factor, different risk factors for pulmonary disease, and how the heart and lungs work together.     Nutrition for Pulmonary Patient:  -Group instruction provided by PowerPoint slides, verbal discussion, and written materials to support subject matter. The instructor gives an explanation and review of healthy diet recommendations, which includes a discussion on weight management, recommendations for fruit and vegetable consumption, as well as protein, fluid, caffeine, fiber, sodium, sugar, and alcohol. Tips for eating when patients are short of breath are discussed.   PULMONARY REHAB OTHER RESPIRATORY from 01/09/2017 in [place]  Date  01/02/17  Educator  [place]  Instruction Review Code  2- meets goals/outcomes      Pursed Lip Breathing:  -Group instruction that is supported by demonstration and informational handouts. Instructor discusses the benefits of pursed lip and diaphragmatic breathing and detailed demonstration on how to preform both.     Oxygen Safety:  -Group instruction provided by PowerPoint, verbal discussion, and written  material to support subject matter. There is an overview of "What is Oxygen" and "Why do we need it".  Instructor also reviews how to create a safe environment for oxygen use, the importance of using oxygen as prescribed, and the risks of noncompliance. There is a brief discussion on traveling with oxygen and resources the patient may utilize.   Oxygen Equipment:  -Group instruction provided by [facility] Staff utilizing handouts, written materials, and equipment demonstrations.   PULMONARY REHAB OTHER RESPIRATORY from 01/09/2017 in [place]   Date  12/05/16  Educator  [facility]  Instruction Review Code  2- meets goals/outcomes      Signs and Symptoms:  -Group instruction provided by written material and verbal discussion to support subject matter. Warning signs and symptoms of infection, stroke, and heart attack are reviewed and when to call the physician/911 reinforced. Tips for preventing the spread of infection discussed.   Advanced Directives:  -Group instruction provided by verbal instruction and written material to support subject matter. Instructor reviews Advanced Directive laws and proper instruction for filling out document.   Pulmonary Video:  -Group video education that reviews the importance of medication and oxygen compliance, exercise, good nutrition, pulmonary hygiene, and pursed lip and diaphragmatic breathing for the pulmonary patient.   Exercise for the Pulmonary Patient:  -Group instruction that is supported by a PowerPoint presentation. Instructor discusses benefits of exercise, core components of exercise, frequency, duration, and intensity of an exercise routine, importance of utilizing pulse oximetry during exercise, safety while exercising, and options of places to exercise outside of rehab.     PULMONARY REHAB OTHER RESPIRATORY from 01/09/2017 in [place]  Date  01/02/17  Educator  ep  Instruction Review Code  2- meets goals/outcomes      Pulmonary Medications:  -Verbally interactive group education provided by instructor with focus on inhaled medications and proper administration.   PULMONARY REHAB OTHER RESPIRATORY from 01/09/2017 in [place]  Date  11/28/16  Educator  Pharmacy  Instruction Review Code  2- meets goals/outcomes      Anatomy and Physiology of the Respiratory System and Intimacy:  -Group instruction provided by PowerPoint, verbal discussion, and written material to support subject matter. Instructor  reviews respiratory cycle and anatomical components of the respiratory system and their functions. Instructor also reviews differences in obstructive and restrictive respiratory diseases with examples of each. Intimacy, Sex, and Sexuality differences are reviewed with a discussion on how relationships can change when diagnosed with pulmonary disease. Common sexual concerns are reviewed.   MD DAY -A group question and answer session with a medical doctor that allows participants to ask questions that relate to their pulmonary disease state.   OTHER EDUCATION -Group or individual verbal, written, or video instructions that support the educational goals of the pulmonary rehab program.   Knowledge Questionnaire Score:     Knowledge Questionnaire Score - 12/18/16 1416      Knowledge Questionnaire Score   Pre Score 9/13      Core Components/Risk Factors/Patient Goals at Admission:     Personal Goals and Risk Factors at Admission - 11/11/16 1059      Core Components/Risk Factors/Patient Goals on Admission    Weight Management Weight Gain   Tobacco Cessation Yes   Number of packs per day .25 ppd   Intervention Assist the participant in steps to quit. Provide individualized education and counseling about committing to Tobacco Cessation, relapse prevention,  and pharmacological support that can be provided by physician.;Advice worker, assist with locating and accessing local/national Quit Smoking programs, and support quit date choice.   Expected Outcomes Short Term: Will demonstrate readiness to quit, by selecting a quit date.   Improve shortness of breath with ADL's Yes   Intervention Provide education, individualized exercise plan and daily activity instruction to help decrease symptoms of SOB with activities of daily living.   Expected Outcomes Short Term: Achieves a reduction of symptoms when performing activities of daily living.   Develop more efficient breathing  techniques such as purse lipped breathing and diaphragmatic breathing; and practicing self-pacing with activity Yes   Intervention Provide education, demonstration and support about specific breathing techniuqes utilized for more efficient breathing. Include techniques such as pursed lipped breathing, diaphragmatic breathing and self-pacing activity.   Expected Outcomes Short Term: Participant will be able to demonstrate and use breathing techniques as needed throughout daily activities.   Increase knowledge of respiratory medications and ability to use respiratory devices properly  Yes   Intervention Provide education and demonstration as needed of appropriate use of medications, inhalers, and oxygen therapy.   Expected Outcomes Short Term: Achieves understanding of medications use. Understands that oxygen is a medication prescribed by physician. Demonstrates appropriate use of inhaler and oxygen therapy.      Core Components/Risk Factors/Patient Goals Review:      Goals and Risk Factor Review    Row Name 12/19/16 0954 01/14/17 1559           Core Components/Risk Factors/Patient Goals Review   Personal Goals Review Improve shortness of breath with ADL's;Increase knowledge of respiratory medications and ability to use respiratory devices properly.;Develop more efficient breathing techniques such as purse lipped breathing and diaphragmatic breathing and practicing self-pacing with activity.  weight gain Improve shortness of breath with ADL's;Increase knowledge of respiratory medications and ability to use respiratory devices properly.;Develop more efficient breathing techniques such as purse lipped breathing and diaphragmatic breathing and practicing self-pacing with activity.      Review Progressing well, equipment increases have been tolerated well, pleasant lady , seems to enjoy coming to class and exercising continues to progress, she was absent for 2 weeks due to excess vitamin D levels, is  back to normal now and is progressing well      Expected Outcomes Continue to progress expect workload increases as strength increases         Core Components/Risk Factors/Patient Goals at Discharge (Final Review):      Goals and Risk Factor Review - 01/14/17 1559      Core Components/Risk Factors/Patient Goals Review   Personal Goals Review Improve shortness of breath with ADL's;Increase knowledge of respiratory medications and ability to use respiratory devices properly.;Develop more efficient breathing techniques such as purse lipped breathing and diaphragmatic breathing and practicing self-pacing with activity.   Review continues to progress, she was absent for 2 weeks due to excess vitamin D levels, is back to normal now and is progressing well   Expected Outcomes expect workload increases as strength increases      ITP Comments:   Comments: ITP REVIEW Pt is making expected progress toward pulmonary rehab goals after completing 11 sessions. Recommend continued exercise, life style modification, education, and utilization of breathing techniques to increase stamina and strength and decrease shortness of breath with exertion.

## 2017-01-14 NOTE — Progress Notes (Signed)
Daily Session Note  Patient Details  Name: Claudia Wiley MRN: [id number] Date of Birth: [birth year] Referring Provider:     Pulmonary Rehab Walk Test from 11/19/2016 in [place]  Referring Provider  Dr. Nelda Marseille      Encounter Date: 01/14/2017  Check In:     Session Check In - 01/14/17 1330      Check-In   Location [facility]   Staff Present Rosebud Poles, RN, BSN;Molly diVincenzo, MS, ACSM RCEP, Exercise Physiologist;Lisa Ysidro Evert, RN;Bettye Sitton Rollene Rotunda, RN, BSN   Supervising physician immediately available to respond to emergencies [facility] immediately available   Physician(s) Dr. Allyson Sabal   Medication changes reported     No   Fall or balance concerns reported    No   Tobacco Cessation No Change   Warm-up and Cool-down Performed as group-led instruction   Resistance Training Performed Yes   VAD Patient? No     Pain Assessment   Currently in Pain? No/denies   Multiple Pain Sites No      Capillary Blood Glucose: No results found for this or any previous visit (from the past 24 hour(s)).      Exercise Prescription Changes - 01/14/17 1529      Response to Exercise   Blood Pressure (Admit) 114/66   Blood Pressure (Exercise) 134/80   Blood Pressure (Exit) 100/60   Heart Rate (Admit) 103 bpm   Heart Rate (Exercise) 121 bpm   Heart Rate (Exit) 97 bpm   Oxygen Saturation (Admit) 97 %   Oxygen Saturation (Exercise) 95 %   Oxygen Saturation (Exit) 96 %   Rating of Perceived Exertion (Exercise) 11   Perceived Dyspnea (Exercise) 1   Duration Progress to 45 minutes of aerobic exercise without signs/symptoms of physical distress   Intensity THRR unchanged     Progression   Progression Continue to progress workloads to maintain intensity without signs/symptoms of physical distress.     Resistance Training   Training Prescription Yes   Weight orange bands   Reps 10-15   Time 10 Minutes     Interval Training   Interval  Training No     Bike   Level 0.8   Minutes 17     NuStep   Level 4   Minutes 17   METs 1.2     Track   Laps 20   Minutes 17      History  Smoking Status  . Current Every Day Smoker  . Packs/day: 0.25  . Years: 2.00  . Types: Cigarettes  Smokeless Tobacco  . Never Used    Comment: Not ready    Goals Met:  Independence with exercise equipment Improved SOB with ADL's Using PLB without cueing & demonstrates good technique Exercise tolerated well No report of cardiac concerns or symptoms Strength training completed today  Goals Unmet:  Not Applicable  Comments: Service time is from 1330 to 1455   Dr. Rush Farmer is Medical Director for Pulmonary Rehab at [facility].

## 2017-01-16 ENCOUNTER — Encounter (HOSPITAL_COMMUNITY)
Admission: RE | Admit: 2017-01-16 | Discharge: 2017-01-16 | Disposition: A | Discharge status: Home / Self Care | Payer: Non-veteran care | Source: Ambulatory Visit | Attending: Pulmonary Disease | Admitting: Pulmonary Disease

## 2017-01-16 VITALS — Wt 87.5 lb

## 2017-01-16 DIAGNOSIS — R0602 Shortness of breath: Secondary | ICD-10-CM

## 2017-01-16 NOTE — Progress Notes (Signed)
Daily Session Note  Patient Details  Name: Claudia Wiley MRN: [id number] Date of Birth: 06-Jul-1956 Referring Provider:     Pulmonary Rehab Walk Test from 11/19/2016 in [place]  Referring Provider  Dr. Nelda Marseille      Encounter Date: 01/16/2017  Check In:     Session Check In - 01/16/17 1334      Check-In   Location [facility]   Staff Present Rosebud Poles, RN, BSN;Ramiro Pangilinan, MS, ACSM RCEP, Exercise Physiologist;Lisa Ysidro Evert, RN;Portia Rollene Rotunda, RN, BSN   Supervising physician immediately available to respond to emergencies [facility] immediately available   Physician(s) Dr. Wendee Beavers   Medication changes reported     No   Fall or balance concerns reported    No   Tobacco Cessation No Change   Warm-up and Cool-down Performed as group-led instruction   Resistance Training Performed Yes   VAD Patient? No     Pain Assessment   Currently in Pain? No/denies   Multiple Pain Sites No      Capillary Blood Glucose: No results found for this or any previous visit (from the past 24 hour(s)).      Exercise Prescription Changes - 01/16/17 1500      Response to Exercise   Blood Pressure (Admit) 104/60   Blood Pressure (Exercise) 120/60   Blood Pressure (Exit) 110/72   Heart Rate (Admit) 94 bpm   Heart Rate (Exercise) 108 bpm   Heart Rate (Exit) 94 bpm   Oxygen Saturation (Admit) 98 %   Oxygen Saturation (Exercise) 97 %   Oxygen Saturation (Exit) 97 %   Rating of Perceived Exertion (Exercise) 11   Perceived Dyspnea (Exercise) 1   Duration Progress to 45 minutes of aerobic exercise without signs/symptoms of physical distress   Intensity THRR unchanged     Progression   Progression Continue to progress workloads to maintain intensity without signs/symptoms of physical distress.     Resistance Training   Training Prescription Yes   Weight orange bands   Reps 10-15   Time 10 Minutes     Interval Training   Interval Training  No     Bike   Level 0.8   Minutes 17     NuStep   Level 4   Minutes 17   METs 2.5      History  Smoking Status  . Current Every Day Smoker  . Packs/day: 0.25  . Years: 2.00  . Types: Cigarettes  Smokeless Tobacco  . Never Used    Comment: Not ready    Goals Met:  Exercise tolerated well No report of cardiac concerns or symptoms Strength training completed today  Goals Unmet:  Not Applicable  Comments: Service time is from 1:30p to 3:10p    Dr. Rush Farmer is Medical Director for Pulmonary Rehab at [facility].

## 2017-01-21 ENCOUNTER — Encounter (HOSPITAL_COMMUNITY)
Admission: RE | Admit: 2017-01-21 | Discharge: 2017-01-21 | Disposition: A | Discharge status: Home / Self Care | Payer: Non-veteran care | Source: Ambulatory Visit | Attending: Pulmonary Disease | Admitting: Pulmonary Disease

## 2017-01-21 VITALS — Wt 88.2 lb

## 2017-01-21 DIAGNOSIS — R0602 Shortness of breath: Secondary | ICD-10-CM

## 2017-01-21 NOTE — Progress Notes (Signed)
Daily Session Note  Patient Details  Name: Claudia Wiley MRN: [id number] Date of Birth: [birth year] Referring Provider:     Pulmonary Rehab Walk Test from 11/19/2016 in [place]  Referring Provider  Dr. Nelda Marseille      Encounter Date: 01/21/2017  Check In:     Session Check In - 01/21/17 1330      Check-In   [facility]   Staff Present Rosebud Poles, RN, BSN;Lisa Ysidro Evert, RN;Avaneesh Pepitone Rollene Rotunda, RN, BSN   Supervising physician immediately available to respond to emergencies [facility] immediately available   Physician(s) Dr. Doyle Askew   Medication changes reported     No   Fall or balance concerns reported    No   Tobacco Cessation No Change   Warm-up and Cool-down Performed as group-led instruction   Resistance Training Performed Yes   VAD Patient? No     Pain Assessment   Currently in Pain? No/denies   Multiple Pain Sites No      Capillary Blood Glucose: No results found for this or any previous visit (from the past 24 hour(s)).      Exercise Prescription Changes - 01/21/17 1527      Response to Exercise   Blood Pressure (Admit) 114/70   Blood Pressure (Exercise) 100/58   Blood Pressure (Exit) 102/64   Heart Rate (Admit) 103 bpm   Heart Rate (Exercise) 109 bpm   Heart Rate (Exit) 93 bpm   Oxygen Saturation (Admit) 95 %   Oxygen Saturation (Exercise) 95 %   Oxygen Saturation (Exit) 98 %   Rating of Perceived Exertion (Exercise) 11   Perceived Dyspnea (Exercise) 0   Duration Progress to 45 minutes of aerobic exercise without signs/symptoms of physical distress   Intensity THRR unchanged     Progression   Progression Continue to progress workloads to maintain intensity without signs/symptoms of physical distress.     Resistance Training   Training Prescription Yes   Weight orange bands   Reps 10-15   Time 10 Minutes     Interval Training   Interval Training No     Bike   Level 0.8   Minutes 17     NuStep   Level 4   Minutes 17   METs 2.2     Track   Laps 19   Minutes 17      History  Smoking Status  . Current Every Day Smoker  . Packs/day: 0.25  . Years: 2.00  . Types: Cigarettes  Smokeless Tobacco  . Never Used    Comment: Not ready    Goals Met:  Independence with exercise equipment Improved SOB with ADL's Using PLB without cueing & demonstrates good technique Exercise tolerated well No report of cardiac concerns or symptoms Strength training completed today  Goals Unmet:  Not Applicable  Comments: Service time is from 1330 to 1455   Dr. Rush Farmer is Medical Director for Pulmonary Rehab at [facility].

## 2017-01-23 ENCOUNTER — Encounter (HOSPITAL_COMMUNITY): Payer: Non-veteran care

## 2017-01-28 ENCOUNTER — Encounter (HOSPITAL_COMMUNITY): Admission: RE | Admit: 2017-01-28 | Payer: Non-veteran care | Source: Ambulatory Visit

## 2017-01-30 ENCOUNTER — Encounter (HOSPITAL_COMMUNITY): Payer: Non-veteran care

## 2017-02-04 ENCOUNTER — Encounter (HOSPITAL_COMMUNITY)
Admission: RE | Admit: 2017-02-04 | Discharge: 2017-02-04 | Disposition: A | Discharge status: Home / Self Care | Payer: Non-veteran care | Source: Ambulatory Visit | Attending: Pulmonary Disease | Admitting: Pulmonary Disease

## 2017-02-04 VITALS — Wt 88.0 lb

## 2017-02-04 DIAGNOSIS — R0602 Shortness of breath: Secondary | ICD-10-CM | POA: Diagnosis not present

## 2017-02-04 NOTE — Progress Notes (Signed)
Daily Session Note  Patient Details  Name: Claudia Wiley MRN: [id number] Date of Birth: 08/31/1955 Referring Provider:     Pulmonary Rehab Walk Test from 11/19/2016 in [facility]  Referring Provider  Dr. Yacoub      Encounter Date: 02/04/2017  Check In:     Session Check In - 02/04/17 1330      Check-In   [facility]   Staff Present Joan Behrens, RN, BSN;Lisa Hughes, RN;Olinty Richards, MS, ACSM CEP, Exercise Physiologist;Annedrea Stackhouse, RN, MHA   Supervising physician immediately available to respond to emergencies [facility] immediately available   Physician(s) Dr. Sabia   Medication changes reported     No   Fall or balance concerns reported    No   Tobacco Cessation No Change   Warm-up and Cool-down Performed as group-led instruction   Resistance Training Performed Yes   VAD Patient? No     Pain Assessment   Currently in Pain? No/denies   Multiple Pain Sites No      Capillary Blood Glucose: No results found for this or any previous visit (from the past 24 hour(s)).      Exercise Prescription Changes - 02/04/17 1600      Response to Exercise   Blood Pressure (Admit) 136/70   Blood Pressure (Exercise) 130/80   Blood Pressure (Exit) 102/70   Heart Rate (Admit) 108 bpm   Heart Rate (Exercise) 120 bpm   Heart Rate (Exit) 101 bpm   Oxygen Saturation (Admit) 98 %   Oxygen Saturation (Exercise) 97 %   Oxygen Saturation (Exit) 96 %   Rating of Perceived Exertion (Exercise) 11   Perceived Dyspnea (Exercise) 0   Duration Progress to 45 minutes of aerobic exercise without signs/symptoms of physical distress   Intensity THRR unchanged     Progression   Progression Continue to progress workloads to maintain intensity without signs/symptoms of physical distress.     Resistance Training   Training Prescription Yes   Weight orange bands   Reps 10-15   Time 10 Minutes     Interval Training   Interval  Training No     Bike   Level 0.8   Minutes 17     NuStep   Level 4   Minutes 17   METs 2.3     Track   Laps 14   Minutes 17      History  Smoking Status  . Current Every Day Smoker  . Packs/day: 0.25  . Years: 2.00  . Types: Cigarettes  Smokeless Tobacco  . Never Used    Comment: Not ready    Goals Met:  Exercise tolerated well Strength training completed today  Goals Unmet:  Not Applicable  Comments: Service time is from 1330 to 1505    Dr. Wesam G. Yacoub is Medical Director for Pulmonary Rehab at [facility]. 

## 2017-02-06 ENCOUNTER — Encounter (HOSPITAL_COMMUNITY)
Admission: RE | Admit: 2017-02-06 | Discharge: 2017-02-06 | Disposition: A | Discharge status: Home / Self Care | Payer: Non-veteran care | Source: Ambulatory Visit | Attending: Pulmonary Disease | Admitting: Pulmonary Disease

## 2017-02-06 VITALS — Wt 89.1 lb

## 2017-02-06 DIAGNOSIS — R0602 Shortness of breath: Secondary | ICD-10-CM

## 2017-02-06 NOTE — Progress Notes (Signed)
Daily Session Note  Patient Details  Name: Claudia Wiley MRN: [id number] Date of Birth: [birth year] Referring Provider:     Pulmonary Rehab Walk Test from 11/19/2016 in [place]  Referring Provider  Dr. Nelda Marseille      Encounter Date: 02/06/2017  Check In:     Session Check In - 02/06/17 1505      Check-In   Location [facility]   Staff Present Su Hilt, MS, ACSM RCEP, Exercise Physiologist;Joan Leonia Reeves, RN, Roque Cash, RN   Physician(s) Dr. Maryland Pink   Medication changes reported     No   Fall or balance concerns reported    No   Tobacco Cessation No Change   Warm-up and Cool-down Performed as group-led instruction   Resistance Training Performed Yes   VAD Patient? No     Pain Assessment   Currently in Pain? No/denies   Multiple Pain Sites No      Capillary Blood Glucose: No results found for this or any previous visit (from the past 24 hour(s)).      Exercise Prescription Changes - 02/06/17 1500      Response to Exercise   Blood Pressure (Admit) 110/70   Blood Pressure (Exercise) 126/62   Blood Pressure (Exit) 104/60   Heart Rate (Admit) 91 bpm   Heart Rate (Exercise) 124 bpm   Heart Rate (Exit) 90 bpm   Oxygen Saturation (Admit) 95 %   Oxygen Saturation (Exercise) 92 %   Oxygen Saturation (Exit) 95 %   Rating of Perceived Exertion (Exercise) 11   Perceived Dyspnea (Exercise) 0   Duration Progress to 45 minutes of aerobic exercise without signs/symptoms of physical distress   Intensity THRR unchanged     Progression   Progression Continue to progress workloads to maintain intensity without signs/symptoms of physical distress.     Resistance Training   Training Prescription Yes   Weight orange bands   Reps 10-15   Time 10 Minutes     Interval Training   Interval Training No     Bike   Level 0.8   Minutes 17     NuStep   Level 4   Minutes 17   METs 2.2      History  Smoking Status  .  Current Every Day Smoker  . Packs/day: 0.25  . Years: 2.00  . Types: Cigarettes  Smokeless Tobacco  . Never Used    Comment: Not ready    Goals Met:  Exercise tolerated well No report of cardiac concerns or symptoms Strength training completed today  Goals Unmet:  Not Applicable  Comments: Service time is from 1:30p to 3:00p    Dr. Rush Farmer is Medical Director for Pulmonary Rehab at [facility].

## 2017-02-11 ENCOUNTER — Encounter (HOSPITAL_COMMUNITY)
Admission: RE | Admit: 2017-02-11 | Discharge: 2017-02-11 | Disposition: A | Discharge status: Home / Self Care | Payer: Non-veteran care | Source: Ambulatory Visit | Attending: Pulmonary Disease | Admitting: Pulmonary Disease

## 2017-02-11 VITALS — Wt 88.0 lb

## 2017-02-11 DIAGNOSIS — R0602 Shortness of breath: Secondary | ICD-10-CM

## 2017-02-11 NOTE — Progress Notes (Addendum)
Daily Session Note  Patient Details  Name: Claudia Wiley MRN: [id number] Date of Birth: Nov 03, 1955 Referring Provider:     Pulmonary Rehab Walk Test from 11/19/2016 in [place]  Referring Provider  Dr. Nelda Marseille      Encounter Date: 02/11/2017  Check In:     Session Check In - 02/11/17 1531      Check-In   Location [facility]   Staff Present Su Hilt, MS, ACSM RCEP, Exercise Physiologist;Lisa Ysidro Evert, RN;Labria Wos Celesta Aver, MS, ACSM CEP, Exercise Physiologist   Supervising physician immediately available to respond to emergencies [facility] immediately available   Physician(s) Dr. Maryland Pink   Medication changes reported     No   Fall or balance concerns reported    No   Tobacco Cessation No Change   Warm-up and Cool-down Performed as group-led instruction   Resistance Training Performed Yes   VAD Patient? No     Pain Assessment   Currently in Pain? No/denies   Multiple Pain Sites No      Capillary Blood Glucose: No results found for this or any previous visit (from the past 24 hour(s)).      Exercise Prescription Changes - 02/11/17 1500      Response to Exercise   Blood Pressure (Admit) 130/80   Blood Pressure (Exercise) 122/70   Blood Pressure (Exit) 120/78   Heart Rate (Admit) 102 bpm   Heart Rate (Exercise) 119 bpm   Heart Rate (Exit) 99 bpm   Oxygen Saturation (Admit) 99 %   Oxygen Saturation (Exercise) 98 %   Oxygen Saturation (Exit) 97 %   Rating of Perceived Exertion (Exercise) 11   Perceived Dyspnea (Exercise) 0   Duration Progress to 45 minutes of aerobic exercise without signs/symptoms of physical distress   Intensity THRR unchanged     Progression   Progression Continue to progress workloads to maintain intensity without signs/symptoms of physical distress.     Resistance Training   Training Prescription Yes   Weight orange bands   Reps 10-15   Time 10 Minutes     Interval Training   Interval Training No     Bike   Level 1  Sci Fit bike today.   Minutes 17     NuStep   Level 4   Minutes 17   METs 2.3     Track   Laps 14   Minutes 17      History  Smoking Status  . Current Every Day Smoker  . Packs/day: 0.25  . Years: 2.00  . Types: Cigarettes  Smokeless Tobacco  . Never Used    Comment: Not ready    Goals Met:  Exercise tolerated well No report of cardiac concerns or symptoms Strength training completed today  Goals Unmet:  Not Applicable  Comments: Service time is from 1330 to 1450  Patient's blood pressure on the track decreased to 92/60, c/o feeling lightheaded. Water given and recheck BP was 118/60, symptoms resolved. Pt states she did not eat lunch today and only had a protein drink. Pt declined a snack, but states she plans to eat when she gets home. Encouraged proper nutrition prior to exercise. Sol Passer, MS, ACSM CEP    Dr. Rush Farmer is Medical Director for Pulmonary Rehab at [facility].

## 2017-02-11 NOTE — Progress Notes (Signed)
Pulmonary Individual Treatment Plan  Patient Details  Name: Claudia Wiley MRN: [id number] Date of Birth: 1956-05-11 Referring Provider:     Pulmonary Rehab Walk Test from 11/19/2016 in [place]  Referring Provider  Dr. Nelda Marseille      Initial Encounter Date:    Pulmonary Rehab Walk Test from 11/19/2016 in [place]  Date  11/19/16  Referring Provider  Dr. Nelda Marseille      Visit Diagnosis: Shortness of breath  Patient's Home Medications on Admission:   Current Outpatient Prescriptions:  .  acetaminophen (TYLENOL) 325 MG tablet, Take 650 mg by mouth every 6 (six) hours as needed. Patient used this medication for pain., Disp: , Rfl:  .  albuterol (PROVENTIL HFA;VENTOLIN HFA) 108 (90 Base) MCG/ACT inhaler, Inhale into the lungs every 6 (six) hours as needed for wheezing or shortness of breath., Disp: , Rfl:  .  amLODipine (NORVASC) 2.5 MG tablet, Take 2.5 mg by mouth daily., Disp: , Rfl:  .  budesonide-formoterol (SYMBICORT) 160-4.5 MCG/ACT inhaler, Inhale 1 puff into the lungs daily as needed., Disp: , Rfl:  .  cholecalciferol (VITAMIN D) 400 units TABS tablet, Take 800 Units by mouth., Disp: , Rfl:  .  cyanocobalamin 1000 MCG tablet, Take 1,000 mcg by mouth daily., Disp: , Rfl:  .  ENSURE (ENSURE), Take 237 mLs by mouth 3 (three) times daily between meals., Disp: , Rfl:  .  esomeprazole (NEXIUM) 40 MG capsule, Take 1 capsule (40 mg total) by mouth daily before breakfast. (Patient not taking: Reported on 11/11/2016), Disp: 30 capsule, Rfl: 0 .  hydrochlorothiazide (HYDRODIURIL) 25 MG tablet, Take 25 mg by mouth daily., Disp: , Rfl:  .  HYDROcodone-acetaminophen (NORCO) 5-325 MG per tablet, Take 1 tablet by mouth every 6 (six) hours as needed for pain. (Patient not taking: Reported on 11/11/2016), Disp: 13 tablet, Rfl: 0 .  lipase/protease/amylase (CREON) 12000 units CPEP capsule, Take 12,000 Units by mouth., Disp: , Rfl:  .  mirtazapine  (REMERON) 15 MG tablet, Take 15 mg by mouth at bedtime., Disp: , Rfl:  .  Multiple Vitamin (MULTIVITAMIN) tablet, Take 1 tablet by mouth daily., Disp: , Rfl:  .  naproxen sodium (ANAPROX) 220 MG tablet, Take 220 mg by mouth 2 (two) times daily with a meal., Disp: , Rfl:  .  pantoprazole (PROTONIX) 40 MG tablet, Take 40 mg by mouth daily., Disp: , Rfl:  .  pregabalin (LYRICA) 150 MG capsule, Take 150 mg by mouth 2 (two) times daily., Disp: , Rfl:  .  promethazine (PHENERGAN) 12.5 MG suppository, Place 1 suppository (12.5 mg total) rectally every 6 (six) hours as needed for nausea. (Patient not taking: Reported on 11/11/2016), Disp: 12 each, Rfl: 0 .  tiZANidine (ZANAFLEX) 4 MG capsule, Take 4 mg by mouth 3 (three) times daily., Disp: , Rfl:  .  Vitamin D, Ergocalciferol, (DRISDOL) 50000 UNITS CAPS, Take 50,000 Units by mouth., Disp: , Rfl:   Past Medical History: Past Medical History:  Diagnosis Date  . Arthritis   . COPD (chronic obstructive pulmonary disease) ([place])   . Hypertension   . Pneumonia   . Weight loss, unintentional     Tobacco Use: History  Smoking Status  . Current Every Day Smoker  . Packs/day: 0.25  . Years: 2.00  . Types: Cigarettes  Smokeless Tobacco  . Never Used    Comment: Not ready    Labs: Recent Review Flowsheet Data    Labs for ITP  Cardiac and Pulmonary Rehab Latest Ref Rng & Units 10/19/2011   Hemoglobin A1c <5.7 % 5.3      Capillary Blood Glucose: No results found for: GLUCAP   ADL UCSD:     Pulmonary Assessment Scores    Row Name 12/18/16 1417         ADL UCSD   ADL Phase Entry     SOB Score total 48       CAT Score   CAT Score 19  Pre        Pulmonary Function Assessment:     Pulmonary Function Assessment - 11/11/16 1056      Breath   Bilateral Breath Sounds Clear   Shortness of Breath Yes;Limiting activity      Exercise Target Goals:    Exercise Program Goal: Individual exercise prescription set with THRR, safety &  activity barriers. Participant demonstrates ability to understand and report RPE using BORG scale, to self-measure pulse accurately, and to acknowledge the importance of the exercise prescription.  Exercise Prescription Goal: Starting with aerobic activity 30 plus minutes a day, 3 days per week for initial exercise prescription. Provide home exercise prescription and guidelines that participant acknowledges understanding prior to discharge.  Activity Barriers & Risk Stratification:   6 Minute Walk:     6 Minute Walk    Row Name 11/21/16 0652         6 Minute Walk   Phase Initial     Distance 1000 feet     Walk Time 6 minutes     # of Rest Breaks 0     MPH 1.89     METS 2.45     RPE 12     Perceived Dyspnea  1     Symptoms Yes (comment)     Comments leg fatigue     Resting HR 98 bpm     Resting BP 100/80     Max Ex. HR 105 bpm     Max Ex. BP 124/80       Interval HR   Baseline HR 98     1 Minute HR 102     2 Minute HR 102     3 Minute HR 104     4 Minute HR 101     5 Minute HR 101     6 Minute HR 105     2 Minute Post HR 98     Interval Heart Rate? Yes       Interval Oxygen   Interval Oxygen? Yes     Baseline Oxygen Saturation % 97 %     Baseline Liters of Oxygen 0 L     1 Minute Oxygen Saturation % 100 %     1 Minute Liters of Oxygen 0 L     2 Minute Oxygen Saturation % 97 %     2 Minute Liters of Oxygen 0 L     3 Minute Oxygen Saturation % 95 %     3 Minute Liters of Oxygen 0 L     4 Minute Oxygen Saturation % 95 %     4 Minute Liters of Oxygen 0 L     5 Minute Oxygen Saturation % 95 %     5 Minute Liters of Oxygen 0 L     6 Minute Oxygen Saturation % 95 %     6 Minute Liters of Oxygen 0 L     2 Minute Post Oxygen Saturation % 96 %  2 Minute Post Liters of Oxygen 0 L        Oxygen Initial Assessment:     Oxygen Initial Assessment - 11/21/16 0654      Initial 6 min Walk   Oxygen Used None   Resting Oxygen Saturation  during 6 min walk 97 %    Exercise Oxygen Saturation  during 6 min walk 95 %     Program Oxygen Prescription   Program Oxygen Prescription None      Oxygen Re-Evaluation:     Oxygen Re-Evaluation    Row Name 12/19/16 0954 01/14/17 1559 02/11/17 1916         Program Oxygen Prescription   Program Oxygen Prescription None None None       Home Oxygen   Home Oxygen Device None None None     Sleep Oxygen Prescription None None None     Home Exercise Oxygen Prescription None None None     Home at Rest Exercise Oxygen Prescription None None None        Oxygen Discharge (Final Oxygen Re-Evaluation):     Oxygen Re-Evaluation - 02/11/17 1916      Program Oxygen Prescription   Program Oxygen Prescription None     Home Oxygen   Home Oxygen Device None   Sleep Oxygen Prescription None   Home Exercise Oxygen Prescription None   Home at Rest Exercise Oxygen Prescription None      Initial Exercise Prescription:     Initial Exercise Prescription - 11/21/16 0600      Date of Initial Exercise RX and Referring Provider   Date 11/19/16   Referring Provider Dr. Nelda Marseille     Bike   Level 0.3   Minutes 17     NuStep   Level 1   Minutes 17   METs 1.5     Track   Laps 5   Minutes 17     Prescription Details   Frequency (times per week) 2   Duration Progress to 45 minutes of aerobic exercise without signs/symptoms of physical distress     Intensity   THRR 40-80% of Max Heartrate 65-130   Ratings of Perceived Exertion 11-13   Perceived Dyspnea 0-4     Progression   Progression Continue progressive overload as per policy without signs/symptoms or physical distress.     Resistance Training   Training Prescription Yes   Weight orange bands   Reps 10-15      Perform Capillary Blood Glucose checks as needed.  Exercise Prescription Changes:     Exercise Prescription Changes    Row Name 11/26/16 1500 11/28/16 1500 12/03/16 1543 12/05/16 1621 12/12/16 1600     Response to Exercise   Blood  Pressure (Admit) 134/74 114/64 123/71 112/70 150/80   Blood Pressure (Exercise) 148/80 124/84  - 126/86 104/74   Blood Pressure (Exit) 118/80 110/66 108/70 120/82 124/78   Heart Rate (Admit) 96 bpm 97 bpm 94 bpm 80 bpm 108 bpm   Heart Rate (Exercise) 109 bpm 107 bpm 107 bpm 99 bpm 105 bpm   Heart Rate (Exit) 85 bpm 88 bpm 88 bpm 89 bpm 90 bpm   Oxygen Saturation (Admit) 93 % 95 % 96 % 97 % 95 %   Oxygen Saturation (Exercise) 94 % 94 % 95 % 96 % 94 %   Oxygen Saturation (Exit) 97 % 96 % 93 % 98 % 99 %   Rating of Perceived Exertion (Exercise) 13 12 13 13 13    Perceived  Dyspnea (Exercise) 1 0 1 1 1    Duration Progress to 45 minutes of aerobic exercise without signs/symptoms of physical distress Progress to 45 minutes of aerobic exercise without signs/symptoms of physical distress Progress to 45 minutes of aerobic exercise without signs/symptoms of physical distress Progress to 45 minutes of aerobic exercise without signs/symptoms of physical distress Progress to 45 minutes of aerobic exercise without signs/symptoms of physical distress   Intensity Other (comment)  THRR 40-80% THRR unchanged THRR unchanged THRR unchanged THRR unchanged     Progression   Progression  - Continue to progress workloads to maintain intensity without signs/symptoms of physical distress. Continue to progress workloads to maintain intensity without signs/symptoms of physical distress. Continue to progress workloads to maintain intensity without signs/symptoms of physical distress. Continue to progress workloads to maintain intensity without signs/symptoms of physical distress.     Resistance Training   Training Prescription Yes Yes Yes Yes Yes   Weight orange bands orange bands orange bands orange bands orange bands   Reps 10-15 10-15 10-15 10-15 10-15     Bike   Level 0.3 0.3 0.5  - 0.5   Minutes 17 17 17   - 17     NuStep   Level 1  - 1  - 2   Minutes 17  - 17  - 17   METs 1.5  - 2.1  - 2.4     Track   Laps 14  15 18 9   -   Minutes 17 17 17 17   -   Row Name 12/17/16 1605 12/31/16 1500 01/02/17 1545 01/07/17 1500 01/09/17 1500     Response to Exercise   Blood Pressure (Admit) 110/70 138/72 118/90 100/54 130/66   Blood Pressure (Exercise) 110/60 160/90 150/76 104/60 130/80   Blood Pressure (Exit) 128/74 108/72 94/60 104/60 104/70   Heart Rate (Admit) 109 bpm 106 bpm 99 bpm 97 bpm 98 bpm   Heart Rate (Exercise) 109 bpm 117 bpm 111 bpm 108 bpm 111 bpm   Heart Rate (Exit) 90 bpm 98 bpm 90 bpm 95 bpm 100 bpm   Oxygen Saturation (Admit) 92 % 98 % 98 % 97 % 96 %   Oxygen Saturation (Exercise) 95 % 97 % 96 % 96 % 96 %   Oxygen Saturation (Exit) 92 % 99 % 95 % 94 % 97 %   Rating of Perceived Exertion (Exercise) 13 13 11 11 11    Perceived Dyspnea (Exercise) 1 3 1 1  0   Duration Progress to 45 minutes of aerobic exercise without signs/symptoms of physical distress Progress to 45 minutes of aerobic exercise without signs/symptoms of physical distress Progress to 45 minutes of aerobic exercise without signs/symptoms of physical distress Progress to 45 minutes of aerobic exercise without signs/symptoms of physical distress Progress to 45 minutes of aerobic exercise without signs/symptoms of physical distress   Intensity THRR unchanged THRR unchanged THRR unchanged THRR unchanged THRR unchanged     Progression   Progression Continue to progress workloads to maintain intensity without signs/symptoms of physical distress. Continue to progress workloads to maintain intensity without signs/symptoms of physical distress. Continue to progress workloads to maintain intensity without signs/symptoms of physical distress. Continue to progress workloads to maintain intensity without signs/symptoms of physical distress. Continue to progress workloads to maintain intensity without signs/symptoms of physical distress.     Resistance Training   Training Prescription Yes Yes Yes Yes Yes   Weight orange bands orange bands orange  bands orange bands orange bands  Reps 10-15 10-15 10-15 10-15 10-15   Time  - -  10 minutes -  10 minutes 10 Minutes 10 Minutes     Interval Training   Interval Training  - No No No No     Bike   Level 0.8 0.8 0.8 0.8  -   Minutes 17 17 17 17   -     NuStep   Level 3 3 4 4 6    Minutes 17 17 17 17 17    METs 2.3 2.1 2.4 2 2.6     Track   Laps 20 15  - 21 16   Minutes 17 17  - 17 17     Home Exercise Plan   Plans to continue exercise at  -  - Home (comment)  -  -   Frequency  -  - Add 3 additional days to program exercise sessions.  -  -   Row Name 01/14/17 1529 01/16/17 1500 01/21/17 1527 02/04/17 1600 02/06/17 1500     Response to Exercise   Blood Pressure (Admit) 114/66 104/60 114/70 136/70 110/70   Blood Pressure (Exercise) 134/80 120/60 100/58 130/80 126/62   Blood Pressure (Exit) 100/60 110/72 102/64 102/70 104/60   Heart Rate (Admit) 103 bpm 94 bpm 103 bpm 108 bpm 91 bpm   Heart Rate (Exercise) 121 bpm 108 bpm 109 bpm 120 bpm 124 bpm   Heart Rate (Exit) 97 bpm 94 bpm 93 bpm 101 bpm 90 bpm   Oxygen Saturation (Admit) 97 % 98 % 95 % 98 % 95 %   Oxygen Saturation (Exercise) 95 % 97 % 95 % 97 % 92 %   Oxygen Saturation (Exit) 96 % 97 % 98 % 96 % 95 %   Rating of Perceived Exertion (Exercise) 11 11 11 11 11    Perceived Dyspnea (Exercise) 1 1 0 0 0   Duration Progress to 45 minutes of aerobic exercise without signs/symptoms of physical distress Progress to 45 minutes of aerobic exercise without signs/symptoms of physical distress Progress to 45 minutes of aerobic exercise without signs/symptoms of physical distress Progress to 45 minutes of aerobic exercise without signs/symptoms of physical distress Progress to 45 minutes of aerobic exercise without signs/symptoms of physical distress   Intensity THRR unchanged THRR unchanged THRR unchanged THRR unchanged THRR unchanged     Progression   Progression Continue to progress workloads to maintain intensity without signs/symptoms  of physical distress. Continue to progress workloads to maintain intensity without signs/symptoms of physical distress. Continue to progress workloads to maintain intensity without signs/symptoms of physical distress. Continue to progress workloads to maintain intensity without signs/symptoms of physical distress. Continue to progress workloads to maintain intensity without signs/symptoms of physical distress.     Resistance Training   Training Prescription Yes Yes Yes Yes Yes   Weight orange bands orange bands orange bands orange bands orange bands   Reps 10-15 10-15 10-15 10-15 10-15   Time 10 Minutes 10 Minutes 10 Minutes 10 Minutes 10 Minutes     Interval Training   Interval Training No No No No No     Bike   Level 0.8 0.8 0.8 0.8 0.8   Minutes 17 17 17 17 17      NuStep   Level 4 4 4 4 4    Minutes 17 17 17 17 17    METs 1.2 2.5 2.2 2.3 2.2     Track   Laps 20  - 19 14  -   Minutes 17  -  71 17  -   Row Name 02/11/17 1500             Response to Exercise   Blood Pressure (Admit) 130/80       Blood Pressure (Exercise) 122/70       Blood Pressure (Exit) 120/78       Heart Rate (Admit) 102 bpm       Heart Rate (Exercise) 119 bpm       Heart Rate (Exit) 99 bpm       Oxygen Saturation (Admit) 99 %       Oxygen Saturation (Exercise) 98 %       Oxygen Saturation (Exit) 97 %       Rating of Perceived Exertion (Exercise) 11       Perceived Dyspnea (Exercise) 0       Duration Progress to 45 minutes of aerobic exercise without signs/symptoms of physical distress       Intensity THRR unchanged         Progression   Progression Continue to progress workloads to maintain intensity without signs/symptoms of physical distress.         Resistance Training   Training Prescription Yes       Weight orange bands       Reps 10-15       Time 10 Minutes         Interval Training   Interval Training No         Bike   Level 1  Sci Fit bike today.       Minutes 17         NuStep    Level 4       Minutes 17       METs 2.3         Track   Laps 14       Minutes 17          Exercise Comments:     Exercise Comments    Row Name 01/06/17 0917           Exercise Comments Home exercise completed          Exercise Goals and Review:   Exercise Goals Re-Evaluation :     Exercise Goals Re-Evaluation    [place] Name 12/16/16 1154 01/13/17 1145 02/07/17 1049         Exercise Goal Re-Evaluation   Exercise Goals Review Increase Physical Activity;Increase Strenth and Stamina Increase Physical Activity;Increase Strenth and Stamina Increase Physical Activity;Increase Strenth and Stamina     Comments Patient is progressing well in program. She consistently rates her perceived exertion at an appropraite level for increasing physical capacity. Averages 15-18 laps (200 ft each). Will cont to monitor and progress as able.  Patient is progressing well in program. She consistently rates her perceived exertion at an appropriate level for increasing physical capacity. Averages 15-18 laps (200 ft each). Will cont to monitor and progress as able.  Patient is progressing well in program. She consistently rates her perceived exertion at an appropriate level for increasing physical capacity. Averages 15-18 laps (200 ft each). Will cont to monitor and progress as able.      Expected Outcomes Through exercising at rehab and at home, patient will increase physical strength and stamina and find ADL's easier to perform.  Through exercising at rehab and at home, patient will increase physical strength and stamina and find ADL's easier to perform.  Through exercising at rehab and at home, patient  will increase physical strength and stamina and find ADL's easier to perform.         Discharge Exercise Prescription (Final Exercise Prescription Changes):     Exercise Prescription Changes - 02/11/17 1500      Response to Exercise   Blood Pressure (Admit) 130/80   Blood Pressure (Exercise) 122/70    Blood Pressure (Exit) 120/78   Heart Rate (Admit) 102 bpm   Heart Rate (Exercise) 119 bpm   Heart Rate (Exit) 99 bpm   Oxygen Saturation (Admit) 99 %   Oxygen Saturation (Exercise) 98 %   Oxygen Saturation (Exit) 97 %   Rating of Perceived Exertion (Exercise) 11   Perceived Dyspnea (Exercise) 0   Duration Progress to 45 minutes of aerobic exercise without signs/symptoms of physical distress   Intensity THRR unchanged     Progression   Progression Continue to progress workloads to maintain intensity without signs/symptoms of physical distress.     Resistance Training   Training Prescription Yes   Weight orange bands   Reps 10-15   Time 10 Minutes     Interval Training   Interval Training No     Bike   Level 1  Sci Fit bike today.   Minutes 17     NuStep   Level 4   Minutes 17   METs 2.3     Track   Laps 14   Minutes 17      Nutrition:  Target Goals: Understanding of nutrition guidelines, daily intake of sodium 1500mg , cholesterol 200mg , calories 30% from fat and 7% or less from saturated fats, daily to have 5 or more servings of fruits and vegetables.  Biometrics:     Pre Biometrics - 11/11/16 1059      Pre Biometrics   Grip Strength 29 kg       Nutrition Therapy Plan and Nutrition Goals:     Nutrition Therapy & Goals - 01/14/17 1539      Nutrition Therapy   Diet High Calorie, High Protein      Personal Nutrition Goals   Nutrition Goal Identify food quantities necessary to achieve wt gain to a goal weight of 90-95 lb at graduation from pulmonary rehab.     Intervention Plan   Intervention Prescribe, educate and counsel regarding individualized specific dietary modifications aiming towards targeted core components such as weight, hypertension, lipid management, diabetes, heart failure and other comorbidities.;Nutrition handout(s) given to patient.  High Calorie, High Protein Nutrition handouts given   Expected Outcomes Short Term Goal: Understand  basic principles of dietary content, such as calories, fat, sodium, cholesterol and nutrients.;Long Term Goal: Adherence to prescribed nutrition plan.      Nutrition Discharge: Rate Your Plate Scores:     Nutrition Assessments - 01/14/17 1601      Rate Your Plate Scores   Pre Score 44  score of < 49 desired due to pt needs to gain wt      Nutrition Goals Re-Evaluation:   Nutrition Goals Discharge (Final Nutrition Goals Re-Evaluation):   Psychosocial: Target Goals: Acknowledge presence or absence of significant depression and/or stress, maximize coping skills, provide positive support system. Participant is able to verbalize types and ability to use techniques and skills needed for reducing stress and depression.  Initial Review & Psychosocial Screening:     Initial Psych Review & Screening - 11/11/16 1104      Initial Review   Current issues with None Identified     Family Dynamics   Good  Support System? Yes     Barriers   Psychosocial barriers to participate in program There are no identifiable barriers or psychosocial needs.     Screening Interventions   Interventions Encouraged to exercise      Quality of Life Scores:   PHQ-9: Recent Review Flowsheet Data    Depression screen [facility] 2/9 11/11/2016   Decreased Interest 0   Down, Depressed, Hopeless 0   PHQ - 2 Score 0     Interpretation of Total Score  Total Score Depression Severity:  1-4 = Minimal depression, 5-9 = Mild depression, 10-14 = Moderate depression, 15-19 = Moderately severe depression, 20-27 = Severe depression   Psychosocial Evaluation and Intervention:     Psychosocial Evaluation - 11/11/16 1105      Psychosocial Evaluation & Interventions   Interventions Encouraged to exercise with the program and follow exercise prescription   Continue Psychosocial Services  No Follow up required      Psychosocial Re-Evaluation:     Psychosocial Re-Evaluation    [place] Name 12/19/16 0956 01/14/17  1601 02/11/17 1920         Psychosocial Re-Evaluation   Current issues with None Identified None Identified None Identified     Expected Outcomes  -  - patient will remain free from psychosocial barriers to participation in the pulmonary rehab program     Interventions Encouraged to attend Pulmonary Rehabilitation for the exercise Encouraged to attend Pulmonary Rehabilitation for the exercise Encouraged to attend Pulmonary Rehabilitation for the exercise     Continue Psychosocial Services  No Follow up required No Follow up required No Follow up required        Psychosocial Discharge (Final Psychosocial Re-Evaluation):     Psychosocial Re-Evaluation - 02/11/17 1920      Psychosocial Re-Evaluation   Current issues with None Identified   Expected Outcomes patient will remain free from psychosocial barriers to participation in the pulmonary rehab program   Interventions Encouraged to attend Pulmonary Rehabilitation for the exercise   Continue Psychosocial Services  No Follow up required      Education: Education Goals: Education classes will be provided on a weekly basis, covering required topics. Participant will state understanding/return demonstration of topics presented.  Learning Barriers/Preferences:     Learning Barriers/Preferences - 11/11/16 1056      Learning Barriers/Preferences   Learning Barriers None   Learning Preferences Individual Instruction;Skilled Demonstration      Education Topics: Risk Factor Reduction:  -Group instruction that is supported by a PowerPoint presentation. Instructor discusses the definition of a risk factor, different risk factors for pulmonary disease, and how the heart and lungs work together.     PULMONARY REHAB OTHER RESPIRATORY from 02/06/2017 in [place]  Date  02/06/17  Educator  ep  Instruction Review Code  2- meets goals/outcomes      Nutrition for Pulmonary Patient:  -Group instruction  provided by PowerPoint slides, verbal discussion, and written materials to support subject matter. The instructor gives an explanation and review of healthy diet recommendations, which includes a discussion on weight management, recommendations for fruit and vegetable consumption, as well as protein, fluid, caffeine, fiber, sodium, sugar, and alcohol. Tips for eating when patients are short of breath are discussed.   PULMONARY REHAB OTHER RESPIRATORY from 02/06/2017 in [place]  Date  01/02/17  Educator  [place]  Instruction Review Code  2- meets goals/outcomes      Pursed Lip Breathing:  -Group  instruction that is supported by demonstration and informational handouts. Instructor discusses the benefits of pursed lip and diaphragmatic breathing and detailed demonstration on how to preform both.     Oxygen Safety:  -Group instruction provided by PowerPoint, verbal discussion, and written material to support subject matter. There is an overview of "What is Oxygen" and "Why do we need it".  Instructor also reviews how to create a safe environment for oxygen use, the importance of using oxygen as prescribed, and the risks of noncompliance. There is a brief discussion on traveling with oxygen and resources the patient may utilize.   Oxygen Equipment:  -Group instruction provided by [facility] Staff utilizing handouts, written materials, and equipment demonstrations.   PULMONARY REHAB OTHER RESPIRATORY from 02/06/2017 in [place]  Date  12/05/16  Educator  [facility]  Instruction Review Code  2- meets goals/outcomes      Signs and Symptoms:  -Group instruction provided by written material and verbal discussion to support subject matter. Warning signs and symptoms of infection, stroke, and heart attack are reviewed and when to call the physician/911 reinforced. Tips for preventing the spread of infection discussed.   Advanced  Directives:  -Group instruction provided by verbal instruction and written material to support subject matter. Instructor reviews Advanced Directive laws and proper instruction for filling out document.   Pulmonary Video:  -Group video education that reviews the importance of medication and oxygen compliance, exercise, good nutrition, pulmonary hygiene, and pursed lip and diaphragmatic breathing for the pulmonary patient.   Exercise for the Pulmonary Patient:  -Group instruction that is supported by a PowerPoint presentation. Instructor discusses benefits of exercise, core components of exercise, frequency, duration, and intensity of an exercise routine, importance of utilizing pulse oximetry during exercise, safety while exercising, and options of places to exercise outside of rehab.     PULMONARY REHAB OTHER RESPIRATORY from 02/06/2017 in [place]  Date  01/02/17  Educator  ep  Instruction Review Code  2- meets goals/outcomes      Pulmonary Medications:  -Verbally interactive group education provided by instructor with focus on inhaled medications and proper administration.   PULMONARY REHAB OTHER RESPIRATORY from 02/06/2017 in [place]  Date  11/28/16  Educator  Pharmacy  Instruction Review Code  2- meets goals/outcomes      Anatomy and Physiology of the Respiratory System and Intimacy:  -Group instruction provided by PowerPoint, verbal discussion, and written material to support subject matter. Instructor reviews respiratory cycle and anatomical components of the respiratory system and their functions. Instructor also reviews differences in obstructive and restrictive respiratory diseases with examples of each. Intimacy, Sex, and Sexuality differences are reviewed with a discussion on how relationships can change when diagnosed with pulmonary disease. Common sexual concerns are reviewed.   MD DAY -A group question  and answer session with a medical doctor that allows participants to ask questions that relate to their pulmonary disease state.   PULMONARY REHAB OTHER RESPIRATORY from 02/06/2017 in [place]  Date  01/16/17  Educator  yacoub  Instruction Review Code  2- meets goals/outcomes      OTHER EDUCATION -Group or individual verbal, written, or video instructions that support the educational goals of the pulmonary rehab program.   Knowledge Questionnaire Score:     Knowledge Questionnaire Score - 12/18/16 1416      Knowledge Questionnaire Score   Pre Score 9/13  Core Components/Risk Factors/Patient Goals at Admission:     Personal Goals and Risk Factors at Admission - 11/11/16 1059      Core Components/Risk Factors/Patient Goals on Admission    Weight Management Weight Gain   Tobacco Cessation Yes   Number of packs per day .25 ppd   Intervention Assist the participant in steps to quit. Provide individualized education and counseling about committing to Tobacco Cessation, relapse prevention, and pharmacological support that can be provided by physician.;Advice worker, assist with locating and accessing local/national Quit Smoking programs, and support quit date choice.   Expected Outcomes Short Term: Will demonstrate readiness to quit, by selecting a quit date.   Improve shortness of breath with ADL's Yes   Intervention Provide education, individualized exercise plan and daily activity instruction to help decrease symptoms of SOB with activities of daily living.   Expected Outcomes Short Term: Achieves a reduction of symptoms when performing activities of daily living.   Develop more efficient breathing techniques such as purse lipped breathing and diaphragmatic breathing; and practicing self-pacing with activity Yes   Intervention Provide education, demonstration and support about specific breathing techniuqes utilized for more  efficient breathing. Include techniques such as pursed lipped breathing, diaphragmatic breathing and self-pacing activity.   Expected Outcomes Short Term: Participant will be able to demonstrate and use breathing techniques as needed throughout daily activities.   Increase knowledge of respiratory medications and ability to use respiratory devices properly  Yes   Intervention Provide education and demonstration as needed of appropriate use of medications, inhalers, and oxygen therapy.   Expected Outcomes Short Term: Achieves understanding of medications use. Understands that oxygen is a medication prescribed by physician. Demonstrates appropriate use of inhaler and oxygen therapy.      Core Components/Risk Factors/Patient Goals Review:      Goals and Risk Factor Review    Row Name 12/19/16 0954 01/14/17 1559 02/11/17 1916 02/11/17 1917       Core Components/Risk Factors/Patient Goals Review   Personal Goals Review Improve shortness of breath with ADL's;Increase knowledge of respiratory medications and ability to use respiratory devices properly.;Develop more efficient breathing techniques such as purse lipped breathing and diaphragmatic breathing and practicing self-pacing with activity.  weight gain Improve shortness of breath with ADL's;Increase knowledge of respiratory medications and ability to use respiratory devices properly.;Develop more efficient breathing techniques such as purse lipped breathing and diaphragmatic breathing and practicing self-pacing with activity. Improve shortness of breath with ADL's;Increase knowledge of respiratory medications and ability to use respiratory devices properly.;Develop more efficient breathing techniques such as purse lipped breathing and diaphragmatic breathing and practicing self-pacing with activity. Weight Management/Obesity;Tobacco Cessation;Develop more efficient breathing techniques such as purse lipped breathing and diaphragmatic breathing and  practicing self-pacing with activity.;Improve shortness of breath with ADL's;Increase knowledge of respiratory medications and ability to use respiratory devices properly.    Review Progressing well, equipment increases have been tolerated well, pleasant lady , seems to enjoy coming to class and exercising continues to progress, she was absent for 2 weeks due to excess vitamin D levels, is back to normal now and is progressing well  - patient continues to progress with her physical exertion capasity. she is observed purse lip breathing during exertion without cueing. she states her shortness of breath is improving however she understands inorder to continue improving she must remain cigarette free    Expected Outcomes Continue to progress expect workload increases as strength increases  - see admission expected outcomes  Core Components/Risk Factors/Patient Goals at Discharge (Final Review):      Goals and Risk Factor Review - 02/11/17 1917      Core Components/Risk Factors/Patient Goals Review   Personal Goals Review Weight Management/Obesity;Tobacco Cessation;Develop more efficient breathing techniques such as purse lipped breathing and diaphragmatic breathing and practicing self-pacing with activity.;Improve shortness of breath with ADL's;Increase knowledge of respiratory medications and ability to use respiratory devices properly.   Review patient continues to progress with her physical exertion capasity. she is observed purse lip breathing during exertion without cueing. she states her shortness of breath is improving however she understands inorder to continue improving she must remain cigarette free   Expected Outcomes see admission expected outcomes      ITP Comments:   Comments: ITP REVIEW Pt is making expected progress toward pulmonary rehab goals after completing 16 sessions. Recommend continued exercise, life style modification, education, and utilization of breathing  techniques to increase stamina and strength and decrease shortness of breath with exertion.

## 2017-02-13 ENCOUNTER — Encounter (HOSPITAL_COMMUNITY)
Admission: RE | Admit: 2017-02-13 | Discharge: 2017-02-13 | Disposition: A | Discharge status: Home / Self Care | Payer: Non-veteran care | Source: Ambulatory Visit | Attending: Pulmonary Disease | Admitting: Pulmonary Disease

## 2017-02-13 VITALS — Wt 87.5 lb

## 2017-02-13 DIAGNOSIS — R0602 Shortness of breath: Secondary | ICD-10-CM | POA: Diagnosis present

## 2017-02-13 NOTE — Progress Notes (Signed)
Daily Session Note  Patient Details  Name: Claudia Wiley MRN: [id number] Date of Birth: [birth year] Referring Provider:     Pulmonary Rehab Walk Test from 11/19/2016 in [place]  Referring Provider  Dr. Nelda Marseille      Encounter Date: 02/13/2017  Check In:     Session Check In - 02/13/17 1336      Check-In   Location [facility]   Staff Present Su Hilt, MS, ACSM RCEP, Exercise Physiologist;Portia Rollene Rotunda, RN, Roque Cash, RN   Supervising physician immediately available to respond to emergencies [facility] immediately available   Physician(s) Dr. Jonnie Finner   Medication changes reported     No   Fall or balance concerns reported    No   Tobacco Cessation No Change   Warm-up and Cool-down Performed as group-led instruction   Resistance Training Performed Yes   VAD Patient? No     Pain Assessment   Currently in Pain? No/denies   Multiple Pain Sites No      Capillary Blood Glucose: No results found for this or any previous visit (from the past 24 hour(s)).      Exercise Prescription Changes - 02/13/17 1500      Response to Exercise   Blood Pressure (Admit) 122/70   Blood Pressure (Exercise) 130/70   Blood Pressure (Exit) 104/72   Heart Rate (Admit) 84 bpm   Heart Rate (Exercise) 106 bpm   Heart Rate (Exit) 98 bpm   Oxygen Saturation (Admit) 95 %   Oxygen Saturation (Exercise) 96 %   Oxygen Saturation (Exit) 96 %   Rating of Perceived Exertion (Exercise) 11   Perceived Dyspnea (Exercise) 0   Duration Progress to 45 minutes of aerobic exercise without signs/symptoms of physical distress   Intensity THRR unchanged     Progression   Progression Continue to progress workloads to maintain intensity without signs/symptoms of physical distress.     Resistance Training   Training Prescription Yes   Weight orange bands   Reps 10-15   Time 10 Minutes     Interval Training   Interval Training No     NuStep   Level 4   Minutes 17   METs 2.1     Track   Laps 21   Minutes 17      History  Smoking Status  . Current Every Day Smoker  . Packs/day: 0.25  . Years: 2.00  . Types: Cigarettes  Smokeless Tobacco  . Never Used    Comment: Not ready    Goals Met:  Exercise tolerated well No report of cardiac concerns or symptoms Strength training completed today  Goals Unmet:  Not Applicable  Comments: Service time is from 1330 to 1515    Dr. Rush Farmer is Medical Director for Pulmonary Rehab at [facility].

## 2017-02-18 ENCOUNTER — Encounter (HOSPITAL_COMMUNITY)
Admission: RE | Admit: 2017-02-18 | Discharge: 2017-02-18 | Disposition: A | Discharge status: Home / Self Care | Payer: Non-veteran care | Source: Ambulatory Visit | Attending: Pulmonary Disease | Admitting: Pulmonary Disease

## 2017-02-18 VITALS — Wt 88.4 lb

## 2017-02-18 DIAGNOSIS — R0602 Shortness of breath: Secondary | ICD-10-CM | POA: Diagnosis not present

## 2017-02-18 NOTE — Progress Notes (Signed)
Daily Session Note  Patient Details  Name: Ariatna Jester MRN: [id number] Date of Birth: [birth year] Referring Provider:     Pulmonary Rehab Walk Test from 11/19/2016 in [place]  Referring Provider  Dr. Nelda Marseille      Encounter Date: 02/18/2017  Check In:     Session Check In - 02/18/17 1330      Check-In   [facility]   Staff Present Su Hilt, MS, ACSM RCEP, Exercise Physiologist;Luigi Stuckey Rollene Rotunda, RN, Roque Cash, RN   Supervising physician immediately available to respond to emergencies [facility] immediately available   Physician(s) Dr. Allyson Sabal   Medication changes reported     No   Fall or balance concerns reported    No   Tobacco Cessation No Change   Warm-up and Cool-down Performed as group-led instruction   Resistance Training Performed Yes   VAD Patient? No     Pain Assessment   Currently in Pain? No/denies   Multiple Pain Sites No      Capillary Blood Glucose: No results found for this or any previous visit (from the past 24 hour(s)).      Exercise Prescription Changes - 02/18/17 1555      Response to Exercise   Blood Pressure (Admit) 130/70   Blood Pressure (Exercise) 140/80   Blood Pressure (Exit) 110/70   Heart Rate (Admit) 96 bpm   Heart Rate (Exercise) 123 bpm   Heart Rate (Exit) 94 bpm   Oxygen Saturation (Admit) 97 %   Oxygen Saturation (Exercise) 94 %   Oxygen Saturation (Exit) 97 %   Rating of Perceived Exertion (Exercise) 11   Perceived Dyspnea (Exercise) 0   Duration Progress to 45 minutes of aerobic exercise without signs/symptoms of physical distress   Intensity THRR unchanged     Progression   Progression Continue to progress workloads to maintain intensity without signs/symptoms of physical distress.     Resistance Training   Training Prescription Yes   Weight orange bands   Reps 10-15   Time 10 Minutes     Interval Training   Interval Training No     Bike   Level 3  SciFit   Minutes 17     NuStep   Level 4   Minutes 17     Track   Laps 22   Minutes 17      History  Smoking Status  . Current Every Day Smoker  . Packs/day: 0.25  . Years: 2.00  . Types: Cigarettes  Smokeless Tobacco  . Never Used    Comment: Not ready    Goals Met:  Independence with exercise equipment Improved SOB with ADL's Using PLB without cueing & demonstrates good technique Exercise tolerated well No report of cardiac concerns or symptoms Strength training completed today  Goals Unmet:  Not Applicable  Comments: Service time is from 1330 to 1510   Dr. Rush Farmer is Medical Director for Pulmonary Rehab at [facility].

## 2017-02-20 ENCOUNTER — Encounter (HOSPITAL_COMMUNITY)
Admission: RE | Admit: 2017-02-20 | Discharge: 2017-02-20 | Disposition: A | Discharge status: Home / Self Care | Payer: Non-veteran care | Source: Ambulatory Visit | Attending: Pulmonary Disease | Admitting: Pulmonary Disease

## 2017-02-20 VITALS — Wt 88.4 lb

## 2017-02-20 DIAGNOSIS — R0602 Shortness of breath: Secondary | ICD-10-CM

## 2017-02-20 NOTE — Progress Notes (Signed)
Daily Session Note  Patient Details  Name: Claudia Wiley MRN: [id number] Date of Birth: 05/17/1956 Referring Provider:     Pulmonary Rehab Walk Test from 11/19/2016 in [place]  Referring Provider  Dr. Nelda Marseille      Encounter Date: 02/20/2017  Check In:     Session Check In - 02/20/17 1351      Check-In   [facility]   Staff Present Su Hilt, MS, ACSM RCEP, Exercise Physiologist;Cleveland Yarbro Ysidro Evert, RN;Portia Rollene Rotunda, RN, BSN   Supervising physician immediately available to respond to emergencies [facility] immediately available   Physician(s) Dr. Clementeen Graham   Medication changes reported     No   Fall or balance concerns reported    No   Tobacco Cessation No Change   Warm-up and Cool-down Performed as group-led instruction   Resistance Training Performed Yes   VAD Patient? No     Pain Assessment   Currently in Pain? No/denies   Multiple Pain Sites No      Capillary Blood Glucose: No results found for this or any previous visit (from the past 24 hour(s)).      Exercise Prescription Changes - 02/20/17 1500      Response to Exercise   Blood Pressure (Admit) 112/62   Blood Pressure (Exercise) 130/70   Blood Pressure (Exit) 102/60   Heart Rate (Admit) 89 bpm   Heart Rate (Exercise) 103 bpm   Heart Rate (Exit) 90 bpm   Oxygen Saturation (Admit) 98 %   Oxygen Saturation (Exercise) 95 %   Oxygen Saturation (Exit) 94 %   Rating of Perceived Exertion (Exercise) 11   Perceived Dyspnea (Exercise) 0   Duration Continue with 45 min of aerobic exercise without signs/symptoms of physical distress.   Intensity THRR unchanged     Progression   Progression Continue to progress workloads to maintain intensity without signs/symptoms of physical distress.     Resistance Training   Training Prescription Yes   Weight orange bands   Reps 10-15   Time 10 Minutes     Interval Training   Interval Training No     Bike   Level 3   Minutes 17     NuStep   Level 4   Minutes 17   METs 2.4      History  Smoking Status  . Current Every Day Smoker  . Packs/day: 0.25  . Years: 2.00  . Types: Cigarettes  Smokeless Tobacco  . Never Used    Comment: Not ready    Goals Met:  Exercise tolerated well No report of cardiac concerns or symptoms Strength training completed today  Goals Unmet:  Not Applicable  Comments: Service time is from 1330 to 1530    Dr. Rush Farmer is Medical Director for Pulmonary Rehab at [facility].

## 2017-02-25 ENCOUNTER — Encounter (HOSPITAL_COMMUNITY): Payer: Non-veteran care

## 2017-02-27 ENCOUNTER — Encounter (HOSPITAL_COMMUNITY)
Admission: RE | Admit: 2017-02-27 | Discharge: 2017-02-27 | Disposition: A | Discharge status: Home / Self Care | Payer: Non-veteran care | Source: Ambulatory Visit | Attending: Pulmonary Disease | Admitting: Pulmonary Disease

## 2017-02-27 VITALS — Wt 88.0 lb

## 2017-02-27 DIAGNOSIS — R0602 Shortness of breath: Secondary | ICD-10-CM | POA: Diagnosis not present

## 2017-02-27 NOTE — Progress Notes (Signed)
Daily Session Note  Patient Details  Name: Claudia Wiley MRN: [id number] Date of Birth: 07-03-1956 Referring Provider:     Pulmonary Rehab Walk Test from 11/19/2016 in [place]  Referring Provider  Dr. Nelda Marseille      Encounter Date: 02/27/2017  Check In:     Session Check In - 02/27/17 1548      Check-In   [facility]   Staff Present Trish Fountain, RN, BSN;Molly diVincenzo, MS, ACSM RCEP, Exercise Physiologist;Reino Lybbert Ysidro Evert, RN   Supervising physician immediately available to respond to emergencies [facility] immediately available   Physician(s) Dr. Wendee Beavers   Medication changes reported     No   Fall or balance concerns reported    No   Tobacco Cessation No Change   Warm-up and Cool-down Performed as group-led instruction   Resistance Training Performed Yes   VAD Patient? No     Pain Assessment   Currently in Pain? No/denies   Multiple Pain Sites No      Capillary Blood Glucose: No results found for this or any previous visit (from the past 24 hour(s)).      Exercise Prescription Changes - 02/27/17 1500      Response to Exercise   Blood Pressure (Admit) 110/70   Blood Pressure (Exercise) 118/80   Blood Pressure (Exit) 110/60   Heart Rate (Admit) 104 bpm   Heart Rate (Exercise) 107 bpm   Heart Rate (Exit) 91 bpm   Oxygen Saturation (Admit) 95 %   Oxygen Saturation (Exercise) 93 %   Oxygen Saturation (Exit) 92 %   Rating of Perceived Exertion (Exercise) 13   Perceived Dyspnea (Exercise) 0   Duration Continue with 45 min of aerobic exercise without signs/symptoms of physical distress.   Intensity THRR unchanged     Progression   Progression Continue to progress workloads to maintain intensity without signs/symptoms of physical distress.     Resistance Training   Training Prescription Yes   Weight orange bands   Reps 10-15   Time 10 Minutes     Interval Training   Interval Training No     Bike   Level 3   Minutes 17     NuStep   Level 4   Minutes 17   METs 2.2     Track   Laps 22   Minutes 17      History  Smoking Status  . Current Every Day Smoker  . Packs/day: 0.25  . Years: 2.00  . Types: Cigarettes  Smokeless Tobacco  . Never Used    Comment: Not ready    Goals Met:  Exercise tolerated well No report of cardiac concerns or symptoms Strength training completed today  Goals Unmet:  Not Applicable  Comments: Service time is from 1330 to 1500    Dr. Rush Farmer is Medical Director for Pulmonary Rehab at [facility].

## 2017-03-04 ENCOUNTER — Encounter (HOSPITAL_COMMUNITY)
Admission: RE | Admit: 2017-03-04 | Discharge: 2017-03-04 | Disposition: A | Discharge status: Home / Self Care | Payer: Non-veteran care | Source: Ambulatory Visit | Attending: Pulmonary Disease | Admitting: Pulmonary Disease

## 2017-03-04 VITALS — Wt 87.3 lb

## 2017-03-04 DIAGNOSIS — R0602 Shortness of breath: Secondary | ICD-10-CM | POA: Diagnosis not present

## 2017-03-04 NOTE — Progress Notes (Signed)
Daily Session Note  Patient Details  Name: Claudia Wiley MRN: [id number] Date of Birth: 12/04/1955 Referring Provider:     Pulmonary Rehab Walk Test from 11/19/2016 in [facility]  Referring Provider  Dr. Yacoub      Encounter Date: 03/04/2017  Check In:     Session Check In - 03/04/17 1506      Check-In   [facility]   Staff Present Molly diVincenzo, MS, ACSM RCEP, Exercise Physiologist;Lisa Hughes, RN;Portia Payne, RN, BSN   Supervising physician immediately available to respond to emergencies [facility] immediately available   Physician(s) Dr. Vega   Medication changes reported     No   Fall or balance concerns reported    No   Tobacco Cessation No Change   Warm-up and Cool-down Performed as group-led instruction   Resistance Training Performed Yes   VAD Patient? No     Pain Assessment   Currently in Pain? No/denies   Multiple Pain Sites No      Capillary Blood Glucose: No results found for this or any previous visit (from the past 24 hour(s)).      Exercise Prescription Changes - 03/04/17 1526      Response to Exercise   Blood Pressure (Admit) 130/70   Blood Pressure (Exercise) 130/70   Blood Pressure (Exit) 108/64   Heart Rate (Admit) 102 bpm   Heart Rate (Exercise) 115 bpm   Heart Rate (Exit) 99 bpm   Oxygen Saturation (Admit) 96 %   Oxygen Saturation (Exercise) 97 %   Oxygen Saturation (Exit) 97 %   Rating of Perceived Exertion (Exercise) 12   Perceived Dyspnea (Exercise) 0   Duration Continue with 45 min of aerobic exercise without signs/symptoms of physical distress.   Intensity THRR unchanged     Progression   Progression Continue to progress workloads to maintain intensity without signs/symptoms of physical distress.     Resistance Training   Training Prescription Yes   Weight orange bands   Reps 10-15   Time 10 Minutes     Interval Training   Interval Training No     Bike   Level 4   Minutes 17     NuStep   Level 4   Minutes 17   METs 2.6     Track   Laps 22   Minutes 17      History  Smoking Status  . Current Every Day Smoker  . Packs/day: 0.25  . Years: 2.00  . Types: Cigarettes  Smokeless Tobacco  . Never Used    Comment: Not ready    Goals Met:  Independence with exercise equipment Improved SOB with ADL's Using PLB without cueing & demonstrates good technique Exercise tolerated well No report of cardiac concerns or symptoms Strength training completed today  Goals Unmet:  Not Applicable  Comments: Service time is from 1330 to 1450   Dr. Wesam G. Yacoub is Medical Director for Pulmonary Rehab at [facility]. 

## 2017-03-06 ENCOUNTER — Encounter (HOSPITAL_COMMUNITY)
Admission: RE | Admit: 2017-03-06 | Discharge: 2017-03-06 | Disposition: A | Discharge status: Home / Self Care | Payer: Non-veteran care | Source: Ambulatory Visit | Attending: Pulmonary Disease | Admitting: Pulmonary Disease

## 2017-03-06 VITALS — Wt 87.7 lb

## 2017-03-06 DIAGNOSIS — R0602 Shortness of breath: Secondary | ICD-10-CM

## 2017-03-06 NOTE — Progress Notes (Signed)
Daily Session Note  Patient Details  Name: Claudia Wiley MRN: [id number] Date of Birth: [birth year] Referring Provider:     Pulmonary Rehab Walk Test from 11/19/2016 in [place]  Referring Provider  Dr. Nelda Marseille      Encounter Date: 03/06/2017  Check In:     Session Check In - 03/06/17 1349      Check-In   Location [facility]   Staff Present Su Hilt, MS, ACSM RCEP, Exercise Physiologist;Lisa Ysidro Evert, RN;Krisna Omar Rollene Rotunda, RN, BSN   Supervising physician immediately available to respond to emergencies [facility] immediately available   Physician(s) Dr. Clementeen Graham   Medication changes reported     No   Fall or balance concerns reported    No   Tobacco Cessation No Change   Warm-up and Cool-down Performed as group-led instruction   Resistance Training Performed Yes   VAD Patient? No     Pain Assessment   Currently in Pain? No/denies   Multiple Pain Sites No      Capillary Blood Glucose: No results found for this or any previous visit (from the past 24 hour(s)).      Exercise Prescription Changes - 03/06/17 1614      Response to Exercise   Blood Pressure (Admit) 110/64   Blood Pressure (Exit) 110/60   Heart Rate (Admit) 89 bpm   Heart Rate (Exercise) 100 bpm   Heart Rate (Exit) 92 bpm   Oxygen Saturation (Admit) 96 %   Oxygen Saturation (Exercise) 97 %   Oxygen Saturation (Exit) 87 %   Rating of Perceived Exertion (Exercise) 11   Perceived Dyspnea (Exercise) 0   Duration Continue with 45 min of aerobic exercise without signs/symptoms of physical distress.   Intensity THRR unchanged     Progression   Progression Continue to progress workloads to maintain intensity without signs/symptoms of physical distress.     Resistance Training   Training Prescription Yes   Weight orange bands   Reps 10-15   Time 10 Minutes     Interval Training   Interval Training No     NuStep   Level 4   Minutes 17   METs 3.3      Track   Laps 14   Minutes 17      History  Smoking Status  . Current Every Day Smoker  . Packs/day: 0.25  . Years: 2.00  . Types: Cigarettes  Smokeless Tobacco  . Never Used    Comment: Not ready    Goals Met:  Independence with exercise equipment Improved SOB with ADL's Using PLB without cueing & demonstrates good technique Exercise tolerated well No report of cardiac concerns or symptoms Strength training completed today  Goals Unmet:  Not Applicable  Comments: Service time is from 1330 to 1525   Dr. Rush Farmer is Medical Director for Pulmonary Rehab at [facility].

## 2017-03-11 ENCOUNTER — Encounter (HOSPITAL_COMMUNITY)
Admission: RE | Admit: 2017-03-11 | Discharge: 2017-03-11 | Disposition: A | Discharge status: Home / Self Care | Payer: Non-veteran care | Source: Ambulatory Visit | Attending: Pulmonary Disease | Admitting: Pulmonary Disease

## 2017-03-11 DIAGNOSIS — R0602 Shortness of breath: Secondary | ICD-10-CM | POA: Diagnosis not present

## 2017-03-28 ENCOUNTER — Encounter (HOSPITAL_COMMUNITY): Payer: Self-pay

## 2017-03-28 NOTE — Progress Notes (Signed)
Discharge Progress Report  Patient Details  Name: Claudia Wiley MRN: [id number] Date of Birth: [birth year] Referring Provider:     Pulmonary Rehab Walk Test from 11/19/2016 in [place]  Referring Provider  Dr. Nelda Marseille       Number of Visits: 23  Reason for Discharge:  Patient reached a stable level of exercise. Patient independent in their exercise. Patient has met program and personal goals.  Smoking History:  History  Smoking Status  . Current Every Day Smoker  . Packs/day: 0.25  . Years: 2.00  . Types: Cigarettes  Smokeless Tobacco  . Never Used    Comment: Not ready    Diagnosis:  No diagnosis found.  ADL UCSD:     Pulmonary Assessment Scores    Row Name 12/18/16 1417 03/06/17 1407       ADL UCSD   ADL Phase Entry Exit    SOB Score total 48 28      CAT Score   CAT Score 19  Pre 17  Exit       Initial Exercise Prescription:     Initial Exercise Prescription - 11/21/16 0600      Date of Initial Exercise RX and Referring Provider   Date 11/19/16   Referring Provider Dr. Nelda Marseille     Bike   Level 0.3   Minutes 17     NuStep   Level 1   Minutes 17   METs 1.5     Track   Laps 5   Minutes 17     Prescription Details   Frequency (times per week) 2   Duration Progress to 45 minutes of aerobic exercise without signs/symptoms of physical distress     Intensity   THRR 40-80% of Max Heartrate 65-130   Ratings of Perceived Exertion 11-13   Perceived Dyspnea 0-4     Progression   Progression Continue progressive overload as per policy without signs/symptoms or physical distress.     Resistance Training   Training Prescription Yes   Weight orange bands   Reps 10-15      Discharge Exercise Prescription (Final Exercise Prescription Changes):     Exercise Prescription Changes - 03/06/17 1614      Response to Exercise   Blood Pressure (Admit) 110/64   Blood Pressure (Exit) 110/60   Heart Rate (Admit) 89 bpm    Heart Rate (Exercise) 100 bpm   Heart Rate (Exit) 92 bpm   Oxygen Saturation (Admit) 96 %   Oxygen Saturation (Exercise) 97 %   Oxygen Saturation (Exit) 87 %   Rating of Perceived Exertion (Exercise) 11   Perceived Dyspnea (Exercise) 0   Duration Continue with 45 min of aerobic exercise without signs/symptoms of physical distress.   Intensity THRR unchanged     Progression   Progression Continue to progress workloads to maintain intensity without signs/symptoms of physical distress.     Resistance Training   Training Prescription Yes   Weight orange bands   Reps 10-15   Time 10 Minutes     Interval Training   Interval Training No     NuStep   Level 4   Minutes 17   METs 3.3     Track   Laps 14   Minutes 17      Functional Capacity:     6 Minute Walk    Row Name 11/21/16 0652 03/11/17 1549       6 Minute Walk   Phase Initial Discharge  Distance 1000 feet 1500 feet    Distance Feet Change  - 500 ft    Walk Time 6 minutes 6 minutes    # of Rest Breaks 0 0    MPH 1.89 2.84    METS 2.45 3.14    RPE 12 12    Perceived Dyspnea  1 0    Symptoms Yes (comment) No    Comments leg fatigue NO COMPLAINTS    Resting HR 98 bpm 107 bpm    Resting BP 100/80 102/54    Resting Oxygen Saturation   - 97 %    Exercise Oxygen Saturation  during 6 min walk  - 90 %    Max Ex. HR 105 bpm 128 bpm    Max Ex. BP 124/80 130/80      Interval HR   Baseline HR (retired) 98  -    1 Minute HR 102 116    2 Minute HR 102 126    3 Minute HR 104 97    4 Minute HR 101 128    5 Minute HR 101 128    6 Minute HR 105 121    2 Minute Post HR 98 110    Interval Heart Rate? Yes Yes      Interval Oxygen   Interval Oxygen? Yes Yes    Baseline Oxygen Saturation % 97 % 97 %    Resting Liters of Oxygen 0 L  -    1 Minute Oxygen Saturation % 100 % 96 %    1 Minute Liters of Oxygen 0 L 0 L    2 Minute Oxygen Saturation % 97 % 96 %    2 Minute Liters of Oxygen 0 L 0 L    3 Minute Oxygen  Saturation % 95 % 93 %    3 Minute Liters of Oxygen 0 L 0 L    4 Minute Oxygen Saturation % 95 % 92 %    4 Minute Liters of Oxygen 0 L 0 L    5 Minute Oxygen Saturation % 95 % 92 %    5 Minute Liters of Oxygen 0 L 0 L    6 Minute Oxygen Saturation % 95 % 90 %    6 Minute Liters of Oxygen 0 L 0 L    2 Minute Post Oxygen Saturation % 96 % 98 %    2 Minute Post Liters of Oxygen 0 L 0 L       Psychological, QOL, Others - Outcomes: PHQ 2/9: Depression screen PHQ 2/9 11/11/2016  Decreased Interest 0  Down, Depressed, Hopeless 0  PHQ - 2 Score 0    Quality of Life:   Personal Goals: Goals established at orientation with interventions provided to work toward goal.     Personal Goals and Risk Factors at Admission - 11/11/16 1059      Core Components/Risk Factors/Patient Goals on Admission    Weight Management Weight Gain   Tobacco Cessation Yes   Number of packs per day .25 ppd   Intervention Assist the participant in steps to quit. Provide individualized education and counseling about committing to Tobacco Cessation, relapse prevention, and pharmacological support that can be provided by physician.;Advice worker, assist with locating and accessing local/national Quit Smoking programs, and support quit date choice.   Expected Outcomes Short Term: Will demonstrate readiness to quit, by selecting a quit date.   Improve shortness of breath with ADL's Yes   Intervention Provide education, individualized exercise plan  and daily activity instruction to help decrease symptoms of SOB with activities of daily living.   Expected Outcomes Short Term: Achieves a reduction of symptoms when performing activities of daily living.   Develop more efficient breathing techniques such as purse lipped breathing and diaphragmatic breathing; and practicing self-pacing with activity Yes   Intervention Provide education, demonstration and support about specific breathing techniuqes utilized for  more efficient breathing. Include techniques such as pursed lipped breathing, diaphragmatic breathing and self-pacing activity.   Expected Outcomes Short Term: Participant will be able to demonstrate and use breathing techniques as needed throughout daily activities.   Increase knowledge of respiratory medications and ability to use respiratory devices properly  Yes   Intervention Provide education and demonstration as needed of appropriate use of medications, inhalers, and oxygen therapy.   Expected Outcomes Short Term: Achieves understanding of medications use. Understands that oxygen is a medication prescribed by physician. Demonstrates appropriate use of inhaler and oxygen therapy.       Personal Goals Discharge:     Goals and Risk Factor Review    Row Name 12/19/16 0954 01/14/17 1559 02/11/17 1916 02/11/17 1917 03/28/17 1432     Core Components/Risk Factors/Patient Goals Review   Personal Goals Review Improve shortness of breath with ADL's;Increase knowledge of respiratory medications and ability to use respiratory devices properly.;Develop more efficient breathing techniques such as purse lipped breathing and diaphragmatic breathing and practicing self-pacing with activity.  weight gain Improve shortness of breath with ADL's;Increase knowledge of respiratory medications and ability to use respiratory devices properly.;Develop more efficient breathing techniques such as purse lipped breathing and diaphragmatic breathing and practicing self-pacing with activity. Improve shortness of breath with ADL's;Increase knowledge of respiratory medications and ability to use respiratory devices properly.;Develop more efficient breathing techniques such as purse lipped breathing and diaphragmatic breathing and practicing self-pacing with activity. Weight Management/Obesity;Tobacco Cessation;Develop more efficient breathing techniques such as purse lipped breathing and diaphragmatic breathing and practicing  self-pacing with activity.;Improve shortness of breath with ADL's;Increase knowledge of respiratory medications and ability to use respiratory devices properly.  -   Review Progressing well, equipment increases have been tolerated well, pleasant lady , seems to enjoy coming to class and exercising continues to progress, she was absent for 2 weeks due to excess vitamin D levels, is back to normal now and is progressing well  - patient continues to progress with her physical exertion capasity. she is observed purse lip breathing during exertion without cueing. she states her shortness of breath is improving however she understands inorder to continue improving she must remain cigarette free  -   Expected Outcomes Continue to progress expect workload increases as strength increases  - see admission expected outcomes patient met all of her expected outcomes at discharge      Exercise Goals and Review:   Nutrition & Weight - Outcomes:     Pre Biometrics - 11/11/16 1059      Pre Biometrics   Grip Strength 29 kg       Nutrition:     Nutrition Therapy & Goals - 01/14/17 1539      Nutrition Therapy   Diet High Calorie, High Protein      Personal Nutrition Goals   Nutrition Goal Identify food quantities necessary to achieve wt gain to a goal weight of 90-95 lb at graduation from pulmonary rehab.     Intervention Plan   Intervention Prescribe, educate and counsel regarding individualized specific dietary modifications aiming towards targeted core components such  as weight, hypertension, lipid management, diabetes, heart failure and other comorbidities.;Nutrition handout(s) given to patient.  High Calorie, High Protein Nutrition handouts given   Expected Outcomes Short Term Goal: Understand basic principles of dietary content, such as calories, fat, sodium, cholesterol and nutrients.;Long Term Goal: Adherence to prescribed nutrition plan.      Nutrition Discharge:     Nutrition  Assessments - 03/10/17 1118      Rate Your Plate Scores   Pre Score 44  score of < 49 desired due to pt needs to gain wt   Post Score 41  score of < 49 desired due to pt needs to gain wt      Education Questionnaire Score:     Knowledge Questionnaire Score - 03/06/17 1406      Knowledge Questionnaire Score   Post Score 13/13      Goals reviewed with patient; copy given to patient.

## 2017-10-29 ENCOUNTER — Encounter: Payer: Self-pay | Admitting: Gastroenterology

## 2017-12-15 ENCOUNTER — Encounter (INDEPENDENT_AMBULATORY_CARE_PROVIDER_SITE_OTHER): Payer: Self-pay

## 2017-12-15 ENCOUNTER — Ambulatory Visit (INDEPENDENT_AMBULATORY_CARE_PROVIDER_SITE_OTHER): Payer: No Typology Code available for payment source | Admitting: Gastroenterology

## 2017-12-15 ENCOUNTER — Encounter: Payer: Self-pay | Admitting: Gastroenterology

## 2017-12-15 VITALS — BP 100/64 | HR 84 | Ht 59.5 in | Wt 84.8 lb

## 2017-12-15 DIAGNOSIS — R1310 Dysphagia, unspecified: Secondary | ICD-10-CM

## 2017-12-15 DIAGNOSIS — K219 Gastro-esophageal reflux disease without esophagitis: Secondary | ICD-10-CM

## 2017-12-15 NOTE — Progress Notes (Signed)
History of Present Illness: This is a 62 year old female referred by Solon Palm, PA-C ([employer]) for the evaluation of dysphagia.  She is accompanied by her daughter.  She relates a 43-month history of dysphagia to solids and liquids.  She generally has only soft foods.  She relates a history of GERD for many years and currently is having frequent regurgitation and heartburn.  Her PPI was recently increased to twice daily however her symptoms have not improved. She states she underwent EGD with dilation at [facility] around 2011 or 2012.  She is edentulous and her dentures no longer fit.  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  CT scan performed in March at the [facility] did not reveal evidence of metastatic disease.  Due to her severe COPD the [facility] determined that they could not provide her endoscopic care and recommended "an upper GI examination as an inpatient via monitored anesthesia care".  The patient and her daughter relate that she underwent colonoscopy in [place] last year and it was normal.  Denies weight loss, abdominal pain, constipation, diarrhea, change in stool caliber, melena, hematochezia, nausea, vomiting, chest pain.     Allergies  Allergen Reactions  . Azithromycin   . Gabapentin    Outpatient Medications Prior to Visit  Medication Sig Dispense Refill  . albuterol (PROVENTIL HFA;VENTOLIN HFA) 108 (90 Base) MCG/ACT inhaler Inhale into the lungs every 6 (six) hours as needed for wheezing or shortness of breath.    Marland Kitchen amLODipine (NORVASC) 2.5 MG tablet Take 2.5 mg by mouth daily.    . budesonide-formoterol (SYMBICORT) 160-4.5 MCG/ACT inhaler Inhale 1 puff into the lungs daily as needed.    . cholecalciferol (VITAMIN D) 400 units TABS tablet Take 800 Units by mouth.    . cyanocobalamin 1000 MCG tablet Take 1,000 mcg by mouth daily.    Marland Kitchen ENSURE (ENSURE) Take 237 mLs by mouth 3 (three) times daily between meals.      Marland Kitchen esomeprazole (NEXIUM) 40 MG capsule Take 1 capsule (40 mg total) by mouth daily before breakfast. 30 capsule 0  . lipase/protease/amylase (CREON) 12000 units CPEP capsule Take 12,000 Units by mouth.    . mirtazapine (REMERON) 15 MG tablet Take 15 mg by mouth at bedtime.    . Multiple Vitamin (MULTIVITAMIN) tablet Take 1 tablet by mouth daily.    . pantoprazole (PROTONIX) 40 MG tablet Take 40 mg by mouth daily.    . pregabalin (LYRICA) 150 MG capsule Take 150 mg by mouth 2 (two) times daily.    . promethazine (PHENERGAN) 25 MG tablet Take 25 mg by mouth every 6 (six) hours as needed for nausea or vomiting.    Marland Kitchen tiZANidine (ZANAFLEX) 4 MG capsule Take 4 mg by mouth 3 (three) times daily.    . Vitamin D, Ergocalciferol, (DRISDOL) 50000 UNITS CAPS Take 50,000 Units by mouth.    Marland Kitchen acetaminophen (TYLENOL) 325 MG tablet Take 650 mg by mouth every 6 (six) hours as needed. Patient used this medication for pain.    . hydrochlorothiazide (HYDRODIURIL) 25 MG tablet Take 25 mg by mouth daily.    Marland Kitchen HYDROcodone-acetaminophen (NORCO) 5-325 MG per tablet Take 1 tablet by mouth every 6 (six) hours as needed for pain. (Patient not taking: Reported on 11/11/2016) 13 tablet 0  . naproxen sodium (ANAPROX) 220 MG tablet Take 220 mg by mouth 2 (two) times daily with a meal.    . promethazine (PHENERGAN) 12.5  MG suppository Place 1 suppository (12.5 mg total) rectally every 6 (six) hours as needed for nausea. (Patient not taking: Reported on 11/11/2016) 12 each 0   No facility-administered medications prior to visit.    Past Medical History:  Diagnosis Date  . Anemia   . Arthritis   . COPD (chronic obstructive pulmonary disease) ([place])   . Esophageal stricture   . Gastrinoma   . GERD (gastroesophageal reflux disease)   . Hypertension   . Osteoporosis   . Pancreatic insufficiency   . Pneumonia   . Vitamin D deficiency   . Weight loss, unintentional    Past Surgical History:  Procedure Laterality Date  .  ABDOMINAL HYSTERECTOMY    . BREAST LUMPECTOMY    . CESAREAN SECTION    . LUNG SURGERY     Social History   Socioeconomic History  . Marital status: Single    Spouse name: Not on file  . Number of children: Not on file  . Years of education: Not on file  . Highest education level: Not on file  Occupational History  . Not on file  Social Needs  . Financial resource strain: Not on file  . Food insecurity:    Worry: Not on file    Inability: Not on file  . Transportation needs:    Medical: Not on file    Non-medical: Not on file  Tobacco Use  . Smoking status: Current Every Day Smoker    Packs/day: 0.25    Years: 2.00    Pack years: 0.50    Types: Cigarettes  . Smokeless tobacco: Never Used  . Tobacco comment: Not ready  Substance and Sexual Activity  . Alcohol use: No  . Drug use: No  . Sexual activity: Never  Lifestyle  . Physical activity:    Days per week: Not on file    Minutes per session: Not on file  . Stress: Not on file  Relationships  . Social connections:    Talks on phone: Not on file    Gets together: Not on file    Attends religious service: Not on file    Active member of club or organization: Not on file    Attends meetings of clubs or organizations: Not on file    Relationship status: Not on file  Other Topics Concern  . Not on file  Social History Narrative  . Not on file   Family History  Problem Relation Age of Onset  . Diabetes Sister        x 3  . Cancer Sister        unsure as to what kind  . Melanoma Maternal Aunt   . Colon cancer Neg Hx   . Pancreatic cancer Neg Hx       Review of Systems: Pertinent positive and negative review of systems were noted in the above HPI section. All other review of systems were otherwise negative.    Physical Exam: General: Well developed, well nourished, thin, no acute distress Head: Normocephalic and atraumatic Eyes:  sclerae anicteric, EOMI Ears: Normal auditory acuity Mouth: Edentulous,  no deformity or lesions Neck: Supple, no masses or thyromegaly Lungs: Clear throughout to auscultation Heart: Regular rate and rhythm; no murmurs, rubs or bruits Abdomen: Soft, non tender and non distended. No masses, hepatosplenomegaly or hernias noted. Normal Bowel sounds Rectal: Not done Musculoskeletal: Symmetrical with no gross deformities  Skin: No lesions on visible extremities Pulses:  Normal pulses noted Extremities: No clubbing, cyanosis,  edema or deformities noted Neurological: Alert oriented x 4, grossly nonfocal Cervical Nodes:  No significant cervical adenopathy Inguinal Nodes: No significant inguinal adenopathy Psychological:  Alert and cooperative. Normal mood and affect  Assessment and Recommendations:  1. Dysphagia.  Rule out esophageal stricture, motility disorder.  Schedule barium esophagram with tablet.  Schedule EGD with possible dilation.  Edentulous and recommended to obtain dentures. The risks (including bleeding, perforation, infection, missed lesions, medication reactions and possible hospitalization or surgery if complications occur), benefits, and alternatives to endoscopy with possible biopsy and possible dilation were discussed with the patient and they consent to proceed.   2. GERD.  Continue current PPI regimen twice daily.  Follow standard antireflux measures.  3. History of gastrinoma.  Follow-up her [facility].   4.  CRC screening, average risk.  Routine screening colonoscopy at 10-year interval in 2028.   cc: Solon Palm, PA-C [facility]

## 2017-12-15 NOTE — Patient Instructions (Signed)
You have been scheduled for an endoscopy. Please follow written instructions given to you at your visit today. If you use inhalers (even only as needed), please bring them with you on the day of your procedure. Your physician has requested that you go to www.startemmi.com and enter the access code given to you at your visit today. This web site gives a general overview about your procedure. However, you should still follow specific instructions given to you by our office regarding your preparation for the procedure.  Normal BMI (Body Mass Index- based on height and weight) is between 19 and 25. Your BMI today is Body mass index is 16.84 kg/m. Marland Kitchen Please consider follow up  regarding your BMI with your Primary Care Provider.  Thank you for choosing me and [facility].  Pricilla Riffle. Dagoberto Ligas., MD., Marval Regal

## 2018-02-10 ENCOUNTER — Encounter: Payer: Self-pay | Admitting: Gastroenterology

## 2018-02-10 ENCOUNTER — Ambulatory Visit (AMBULATORY_SURGERY_CENTER): Payer: No Typology Code available for payment source | Admitting: Gastroenterology

## 2018-02-10 VITALS — BP 120/76 | HR 80 | Temp 97.1°F | Resp 15 | Ht 59.0 in | Wt 84.0 lb

## 2018-02-10 DIAGNOSIS — K222 Esophageal obstruction: Secondary | ICD-10-CM

## 2018-02-10 DIAGNOSIS — R1310 Dysphagia, unspecified: Secondary | ICD-10-CM

## 2018-02-10 MED ORDER — SODIUM CHLORIDE 0.9 % IV SOLN: 500.0000 mL | Freq: Once | INTRAVENOUS | Status: DC

## 2018-02-10 NOTE — Progress Notes (Signed)
Spontaneous respirations throughout. VSS. Resting comfortably. To PACU on room air. Report to  RN. 

## 2018-02-10 NOTE — Progress Notes (Signed)
Called to room to assist during endoscopic procedure.  Patient ID and intended procedure confirmed with present staff. Received instructions for my participation in the procedure from the performing physician.  

## 2018-02-10 NOTE — Patient Instructions (Addendum)
Patient, Claudia Wiley, had an Upper GI Endoscopy Procedure today at [facility] in [place], [place] by Dr. Fuller Plan.

## 2018-02-10 NOTE — Op Note (Signed)
[place] Patient Name: Claudia Wiley Procedure Date: 02/10/2018 10:00 AM MRN: [id number] Endoscopist: [name] , MD Age: 62 Referring MD:  Date of Birth: [birth year] Gender: Female Account #: [id number] Procedure:                Upper GI endoscopy Indications:              Dysphagia Medicines:                Monitored Anesthesia Care Procedure:                Pre-Anesthesia Assessment:                           - Prior to the procedure, a History and Physical                            was performed, and patient medications and                            allergies were reviewed. The patient's tolerance of                            previous anesthesia was also reviewed. The risks                            and benefits of the procedure and the sedation                            options and risks were discussed with the patient.                            All questions were answered, and informed consent                            was obtained. Prior Anticoagulants: The patient has                            taken no previous anticoagulant or antiplatelet                            agents. ASA Grade Assessment: III - A patient with                            severe systemic disease. After reviewing the risks                            and benefits, the patient was deemed in                            satisfactory condition to undergo the procedure.                           After obtaining informed consent, the endoscope was  passed under direct vision. Throughout the                            procedure, the patient's blood pressure, pulse, and                            oxygen saturations were monitored continuously. The                            Endoscope was introduced through the mouth, and                            advanced to the second part of duodenum. The upper                            GI endoscopy was accomplished without  difficulty.                            The patient tolerated the procedure well. Scope In: Scope Out: Findings:                 One benign-appearing, intrinsic moderate stenosis                            was found 25 cm from the incisors. This stenosis                            measured 1.2 cm (inner diameter). The stenosis was                            traversed.                           One benign-appearing, intrinsic mild stenosis was                            found 36 cm from the incisors. This stenosis                            measured 1.3 cm (inner diameter). The stenosis was                            traversed. A guidewire was placed and the scope was                            withdrawn. Dilations were performed with Savary                            dilators with mild resistance at 13 mm, 14 mm and                            15 mm.                           LA Grade  B (one or more mucosal breaks greater than                            5 mm, not extending between the tops of two mucosal                            folds) esophagitis with no bleeding was found in                            the distal esophagus.                           The exam of the esophagus was otherwise normal.                           A small hiatal hernia was present.                           The exam of the stomach was otherwise normal.                           The duodenal bulb and second portion of the                            duodenum were normal. Complications:            No immediate complications. Estimated Blood Loss:     Estimated blood loss was minimal. Impression:               - Benign-appearing esophageal stenosis. Dilated.                           - Benign-appearing esophageal stenosis. Dilated.                           - LA Grade B reflux esophagitis.                           - Small hiatal hernia.                           - Normal duodenal bulb and second portion of the                             duodenum.                           - No specimens collected. Recommendation:           - Patient has a contact number available for                            emergencies. The signs and symptoms of potential                            delayed complications were discussed with the  patient. Return to normal activities tomorrow.                            Written discharge instructions were provided to the                            patient.                           - Clear liquid diet for 2 hours, then advance as                            tolerated to soft diet today. Resume prior diet                            tomorrow.                           - Antireflux measures long term.                           - Continue present medications including                            pantoprazole 40 mg po bid.                           - Follow up with Satira Sark, PA-C [name], MD 02/10/2018 10:27:38 AM This report has been signed electronically.

## 2018-02-10 NOTE — Progress Notes (Signed)
[facility] Note:  Patient, Claudia Wiley, had an Upper GI Endoscopy procedure today at [facility] in [place], [place] by Dr. Jerilynn Mages. Fuller Plan.  Bethann Berkshire, MSN, RN

## 2018-02-11 ENCOUNTER — Telehealth: Payer: Self-pay

## 2018-02-11 NOTE — Telephone Encounter (Signed)
  Follow up Call-  Call back number 02/10/2018  Post procedure Call Back phone  # [phone]  Permission to leave phone message Yes  Some recent data might be hidden     Patient questions:  Do you have a fever, pain , or abdominal swelling? Yes.   Pain Score  4 *  Have you tolerated food without any problems? Yes.    Have you been able to return to your normal activities? Yes.    Do you have any questions about your discharge instructions: Diet   No. Medications  No. Follow up visit  No.  Do you have questions or concerns about your Care? No.  Actions: * If pain score is 4 or above: No action needed, pain <4. Sore throat and abd pain Sipping on coffee Will try warm salt water gargle

## 2018-04-14 ENCOUNTER — Encounter: Payer: Self-pay | Admitting: Genetic Counselor

## 2018-04-14 ENCOUNTER — Telehealth: Payer: Self-pay | Admitting: Genetic Counselor

## 2018-04-14 NOTE — Telephone Encounter (Signed)
A genetic counseling appt has been scheduled for the pt to see Roma Kayser on 10/23 at 9am. Pt's daughter aware to arrive 15 minutes early. Letter mailed.

## 2018-05-06 ENCOUNTER — Inpatient Hospital Stay: Payer: Non-veteran care

## 2018-05-06 ENCOUNTER — Encounter: Payer: Self-pay | Admitting: Genetic Counselor

## 2018-05-06 ENCOUNTER — Inpatient Hospital Stay: Payer: Non-veteran care | Attending: Genetic Counselor | Admitting: Genetic Counselor

## 2018-05-06 DIAGNOSIS — D379 Neoplasm of uncertain behavior of digestive organ, unspecified: Secondary | ICD-10-CM | POA: Diagnosis not present

## 2018-05-06 DIAGNOSIS — Z808 Family history of malignant neoplasm of other organs or systems: Secondary | ICD-10-CM

## 2018-05-06 NOTE — Progress Notes (Signed)
REFERRING PROVIDER: Carlean Jews, MD [address] [place], [place] [address]  PRIMARY PROVIDER:  Patient, No Pcp Per  PRIMARY REASON FOR VISIT:  1. Gastrinoma   2. Family history of melanoma   3. Family history of brain cancer      HISTORY OF PRESENT ILLNESS:   Claudia Wiley, a 62 y.o. female, was seen for a Claudia Wiley at the request of Dr. Neita Carp due to a personal and family history of cancer.  Claudia Wiley presents to clinic today to discuss the possibility of a hereditary predisposition to cancer, genetic testing, and to further clarify her future cancer risks, as well as potential cancer risks for family members.   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  At the same time, she ws diagnosed with NSCLC.  She was referred to determine if her Claudia Wiley is due to a hereditary condition such as MEN.    CANCER HISTORY:   No history exists.     HORMONAL RISK FACTORS:  Menarche was at age 70.  First live birth at age 70.  OCP use for approximately 0 years.  Ovaries intact: yes.  Hysterectomy: yes.  Menopausal status: postmenopausal.  HRT use: 0 years. Colonoscopy: yes; 2 polyps. Mammogram within the last year: yes. Number of breast biopsies: 1. Up to date with pelvic exams:  n/a. Any excessive radiation exposure in the past:  no  Past Medical History:  Diagnosis Date  . Anemia   . Arthritis   . Cancer [facility]) 2013   Upper left lung and stomach cancer  . COPD (chronic obstructive pulmonary disease) ([place])   . Esophageal stricture   . Family history of brain cancer   . Family history of melanoma   . Gastrinoma   . Gastrinoma   . GERD (gastroesophageal reflux disease)   . Hypertension   . Osteoporosis   . Pancreatic insufficiency   . Pneumonia   . Vitamin D deficiency   . Weight loss, unintentional     Past Surgical History:  Procedure Laterality Date  . ABDOMINAL HYSTERECTOMY     . BREAST LUMPECTOMY    . CESAREAN SECTION    . LUNG SURGERY      Social History   Socioeconomic History  . Marital status: Single    Spouse name: Not on file  . Number of children: Not on file  . Years of education: Not on file  . Highest education level: Not on file  Occupational History  . Not on file  Social Needs  . Financial resource strain: Not on file  . Food insecurity:    Worry: Not on file    Inability: Not on file  . Transportation needs:    Medical: Not on file    Non-medical: Not on file  Tobacco Use  . Smoking status: Current Every Day Smoker    Packs/day: 0.25    Years: 2.00    Pack years: 0.50    Types: Cigarettes  . Smokeless tobacco: Never Used  . Tobacco comment: Not ready  Substance and Sexual Activity  . Alcohol use: No  . Drug use: No  . Sexual activity: Never  Lifestyle  . Physical activity:    Days per week: Not on file    Minutes per session: Not on file  . Stress: Not on file  Relationships  . Social connections:    Talks on phone: Not on file  Gets together: Not on file    Attends religious service: Not on file    Active member of club or organization: Not on file    Attends meetings of clubs or organizations: Not on file    Relationship status: Not on file  Other Topics Concern  . Not on file  Social History Narrative  . Not on file     FAMILY HISTORY:  We obtained a detailed, 4-generation family history.  Significant diagnoses are listed below: Family History  Problem Relation Age of Onset  . Diabetes Sister        x 3  . Cancer Sister 6       brain that metastized to bone, stomach and other organs  . Melanoma Maternal Aunt   . Heart attack Father   . Pneumonia Brother   . Cancer Other 17       cancer in his chest - nephew  . Colon cancer Neg Hx   . Pancreatic cancer Neg Hx   . Rectal cancer Neg Hx   . Esophageal cancer Neg Hx     The patient has a son and daughter who are cancer free.  Her daughter has had  thyroid nodules removed recently.  The patient has seven sisters and one full brother, as well as four maternal half brothers.  One sister died of metastatic brain cancer and her full brother died of double pneumonia.  A half brother has a son who has a cancer in his chest.  The patient's father is deceased and her mother is living.  The patient's father died at 106 from a heart attack.  His family history is unknown.  The patient's mother is living at 55.  She is one of 18 siblings.  She had one sister who had melanoma over 34.  The maternal grandparents are deceased due to old age, they both died close to 60 years of age.  Claudia Wiley is unaware of previous family history of genetic testing for hereditary cancer risks. Patient's maternal ancestors are of [place] and [place] descent, and paternal ancestors are of [place] American descent. There is no reported Ashkenazi Jewish ancestry. There is no known consanguinity.  GENETIC COUNSELING ASSESSMENT: Claudia Wiley is a 62 y.o. 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 We, therefore, discussed and recommended the following at today's visit.   DISCUSSION: We discussed that Gastrinomas are relatively rare and that when we see them we are concerned for MEN.  The patient does not have other symptoms of MEN, such as pituitary adenomas, hyperparathyroidism, heat intolerance or clammy hands.   The patient has a family history of melanoma.  She is of African American descent,.  We discussed that melanoma in this population is less common than in the caucasian population. With melanoma and the brain tumor combination this could be a sign of CDKN2A mutations, as well as other hereditary Melanoma genes.    We reviewed the characteristics, features and inheritance patterns of hereditary cancer syndromes. We also discussed genetic testing, including the appropriate family members  to test, the process of testing, insurance coverage and turn-around-time for results. We discussed the implications of a negative, positive and/or variant of uncertain significant result. We recommended Claudia Wiley pursue genetic testing for the Multi cancer gene panel. The Multi-Gene Panel offered by [facility] includes sequencing and/or deletion duplication testing of the following 84 genes: AIP, ALK, APC, ATM, AXIN2,BAP1,  BARD1, BLM, BMPR1A, BRCA1, BRCA2, BRIP1, CASR, CDC73, CDH1, CDK4, CDKN1B, CDKN1C, CDKN2A (p14ARF), CDKN2A (p16INK4a), CEBPA, CHEK2, CTNNA1, DICER1, DIS3L2, EGFR (c.2369C>T, p.Thr790Met variant only), EPCAM (Deletion/duplication testing only), FH, FLCN, GATA2, GPC3, GREM1 (Promoter region deletion/duplication testing only), HOXB13 (c.251G>A, p.Gly84Glu), HRAS, KIT, MAX, MEN1, MET, MITF (c.952G>A, p.Glu318Lys variant only), MLH1, MSH2, MSH3, MSH6, MUTYH, NBN, NF1, NF2, NTHL1, PALB2, PDGFRA, PHOX2B, PMS2, POLD1, POLE, POT1, PRKAR1A, PTCH1, PTEN, RAD50, RAD51C, RAD51D, RB1, RECQL4, RET, RUNX1, SDHAF2, SDHA (sequence changes only), SDHB, SDHC, SDHD, SMAD4, SMARCA4, SMARCB1, SMARCE1, STK11, SUFU, TERC, TERT, TMEM127, TP53, TSC1, TSC2, VHL, WRN and WT1.    Based on Claudia Wiley's personal and family history of cancer, she meets medical criteria for genetic testing. Despite that she meets criteria, she may still have an out of pocket cost. We discussed that if her out of pocket cost for testing is over $100, the laboratory will call and confirm whether she wants to proceed with testing.  If the out of pocket cost of testing is less than $100 she will be billed by the genetic testing laboratory.   PLAN: After considering the risks, benefits, and limitations, Claudia Wiley  provided informed consent to pursue genetic testing and the blood sample was sent to [facility] for analysis of the Multi cancer panel. Results should be available within approximately 2-3 weeks' time, at which point they will be  disclosed by telephone to Claudia Wiley, as will any additional recommendations warranted by these results. Claudia Wiley will receive a summary of her genetic counseling visit and a copy of her results once available. This information will also be available in Epic. We encouraged Claudia Wiley to remain in contact with cancer genetics annually so that we can continuously update the family history and inform her of any changes in cancer genetics and testing that may be of benefit for her family. Claudia Wiley questions were answered to her satisfaction today. Our contact information was provided should additional questions or concerns arise.  Lastly, we encouraged Claudia Wiley to remain in contact with cancer genetics annually so that we can continuously update the family history and inform her of any changes in cancer genetics and testing that may be of benefit for this family.   Ms.  Wiley questions were answered to her satisfaction today. Our contact information was provided should additional questions or concerns arise. Thank you for the referral and allowing [place] to share in the care of your patient.   Karen P. Florene Glen, [place], [facility] Certified Genetic Counselor [email or web address] phone: [phone]  The patient was seen for a total of 60 minutes in face-to-face genetic counseling.  This patient was discussed with Drs. Magrinat, Lindi Adie and/or Burr Medico who agrees with the above.    _______________________________________________________________________ For Office Staff:  Number of people involved in session: 2 Was an Intern/ student involved with case: no

## 2018-06-01 ENCOUNTER — Encounter: Payer: Self-pay | Admitting: Genetic Counselor

## 2018-06-01 ENCOUNTER — Telehealth: Payer: Self-pay | Admitting: Genetic Counselor

## 2018-06-01 DIAGNOSIS — Z1379 Encounter for other screening for genetic and chromosomal anomalies: Secondary | ICD-10-CM | POA: Insufficient documentation

## 2018-06-01 NOTE — Telephone Encounter (Signed)
LM on VM to please call for results

## 2018-06-02 ENCOUNTER — Ambulatory Visit: Payer: Self-pay | Admitting: Genetic Counselor

## 2018-06-02 DIAGNOSIS — Z1379 Encounter for other screening for genetic and chromosomal anomalies: Secondary | ICD-10-CM

## 2018-06-02 NOTE — Progress Notes (Signed)
HPI:  Ms. Cude was previously seen in the [place] clinic due to a personal history of a Gastrinoma and family history of cancer and concerns regarding a hereditary predisposition to cancer. Please refer to our prior cancer genetics clinic note for more information regarding Ms. Roach's medical, social and family histories, and our assessment and recommendations, at the time. Ms. Steger recent genetic test results were disclosed to her, as were recommendations warranted by these results. These results and recommendations are discussed in more detail below.  CANCER HISTORY:   No history exists.    FAMILY HISTORY:  We obtained a detailed, 4-generation family history.  Significant diagnoses are listed below: Family History  Problem Relation Age of Onset  . Diabetes Sister        x 3  . Cancer Sister 70       brain that metastized to bone, stomach and other organs  . Melanoma Maternal Aunt   . Heart attack Father   . Pneumonia Brother   . Cancer Other 17       cancer in his chest - nephew  . Colon cancer Neg Hx   . Pancreatic cancer Neg Hx   . Rectal cancer Neg Hx   . Esophageal cancer Neg Hx      The patient has a son and daughter who are cancer free.  Her daughter has had thyroid nodules removed recently.  The patient has seven sisters and one full brother, as well as four maternal half brothers.  One sister died of metastatic brain cancer and her full brother died of double pneumonia.  A half brother has a son who has a cancer in his chest.  The patient's father is deceased and her mother is living.  The patient's father died at 3 from a heart attack.  His family history is unknown.  The patient's mother is living at 65.  She is one of 18 siblings.  She had one sister who had melanoma over 33.  The maternal grandparents are deceased due to old age, they both died close to 60 years of age.  Ms. Greulich is unaware of previous family history of genetic testing for  hereditary cancer risks. Patient's maternal ancestors are of [place] American and [place] descent, and paternal ancestors are of [place] American descent. There is no reported Ashkenazi Jewish ancestry. There is no known consanguinity.  GENETIC TEST RESULTS: Genetic testing reported out on May 26, 2018 through the multi-cancer panel found no deleterious mutations.  The Multi-Gene Panel offered by [facility] includes sequencing and/or deletion duplication testing of the following 84 genes: AIP, ALK, APC, ATM, AXIN2,BAP1,  BARD1, BLM, BMPR1A, BRCA1, BRCA2, BRIP1, CASR, CDC73, CDH1, CDK4, CDKN1B, CDKN1C, CDKN2A (p14ARF), CDKN2A (p16INK4a), CEBPA, CHEK2, CTNNA1, DICER1, DIS3L2, EGFR (c.2369C>T, p.Thr790Met variant only), EPCAM (Deletion/duplication testing only), FH, FLCN, GATA2, GPC3, GREM1 (Promoter region deletion/duplication testing only), HOXB13 (c.251G>A, p.Gly84Glu), HRAS, KIT, MAX, MEN1, MET, MITF (c.952G>A, p.Glu318Lys variant only), MLH1, MSH2, MSH3, MSH6, MUTYH, NBN, NF1, NF2, NTHL1, PALB2, PDGFRA, PHOX2B, PMS2, POLD1, POLE, POT1, PRKAR1A, PTCH1, PTEN, RAD50, RAD51C, RAD51D, RB1, RECQL4, RET, RUNX1, SDHAF2, SDHA (sequence changes only), SDHB, SDHC, SDHD, SMAD4, SMARCA4, SMARCB1, SMARCE1, STK11, SUFU, TERC, TERT, TMEM127, TP53, TSC1, TSC2, VHL, WRN and WT1.   The test report has been scanned into EPIC and is located under the Molecular Pathology section of the Results Review tab.    We discussed with Ms. Vanaman that since the current genetic testing is not perfect, it is possible  there may be a gene mutation in one of these genes that current testing cannot detect, but that chance is small.  We also discussed, that it is possible that another gene that has not yet been discovered, or that we have not yet tested, is responsible for the cancer diagnoses in the family, and it is, therefore, important to remain in touch with cancer genetics in the future so that we can continue to offer Ms. Richardson the most up  to date genetic testing.   Genetic testing did detect two Variants of Unknown Significance - one in the NTHL1 gene called c.173C>T and the other in the POLD1 gene called c.50G>A. At this time, it is unknown if these variants are associated with an increased cancer risk or if they are a normal finding, but most variants such as these get reclassified to being inconsequential. They should not be used to make medical management decisions. With time, we suspect the lab will determine the significance of these variants, if any. If we do learn more about them, we will try to contact Ms. Janssen to discuss it further. However, it is important to stay in touch with [place] periodically and keep the address and phone number up to date.   CANCER SCREENING RECOMMENDATIONS: This result is reassuring and indicates that Ms. Demery likely does not have an increased risk for a future cancer due to a mutation in one of these genes. This normal test also suggests that Ms. Montag's cancer was most likely not due to an inherited predisposition associated with one of these genes.  Most cancers happen by chance and this negative test suggests that her cancer falls into this category.  We, therefore, recommended she continue to follow the cancer management and screening guidelines provided by her oncology and primary healthcare provider.   An individual's cancer risk and medical management are not determined by genetic test results alone. Overall cancer risk assessment incorporates additional factors, including personal medical history, family history, and any available genetic information that may result in a personalized plan for cancer prevention and surveillance.  RECOMMENDATIONS FOR FAMILY MEMBERS:  Women in this family might be at some increased risk of developing cancer, over the general population risk, simply due to the family history of cancer.  We recommended women in this family have a yearly mammogram beginning at age 91, or  53 years younger than the earliest onset of cancer, an annual clinical breast exam, and perform monthly breast self-exams. Women in this family should also have a gynecological exam as recommended by their primary provider. All family members should have a colonoscopy by age 67.  FOLLOW-UP: Lastly, we discussed with Ms. Rumple that cancer genetics is a rapidly advancing field and it is possible that new genetic tests will be appropriate for her and/or her family members in the future. We encouraged her to remain in contact with cancer genetics on an annual basis so we can update her personal and family histories and let her know of advances in cancer genetics that may benefit this family.   Our contact number was provided. Ms. Burdine questions were answered to her satisfaction, and she knows she is welcome to call us at anytime with additional questions or concerns.   Roma Kayser, MS, [facility] Certified Genetic Counselor [email or web address]

## 2018-06-02 NOTE — Telephone Encounter (Signed)
Revealed negative genetic testing.  Discussed that we do not know why she has a gastrinoma or why there is cancer in the family. It could be due to a different gene that we are not testing, or maybe our current technology may not be able to pick something up.  It will be important for her to keep in contact with genetics to keep up with whether additional testing may be needed.  We discussed that there are two VUS, but they will not influence her medical management.

## 2018-06-15 ENCOUNTER — Emergency Department (HOSPITAL_COMMUNITY): Payer: No Typology Code available for payment source

## 2018-06-15 ENCOUNTER — Encounter (HOSPITAL_COMMUNITY): Payer: Self-pay

## 2018-06-15 ENCOUNTER — Inpatient Hospital Stay (HOSPITAL_COMMUNITY)
Admission: EM | Admit: 2018-06-15 | Discharge: 2018-06-18 | DRG: 190 | Disposition: A | Discharge status: Home / Self Care | Payer: No Typology Code available for payment source | Attending: Internal Medicine | Admitting: Internal Medicine

## 2018-06-15 DIAGNOSIS — Z79899 Other long term (current) drug therapy: Secondary | ICD-10-CM

## 2018-06-15 DIAGNOSIS — Z902 Acquired absence of lung [part of]: Secondary | ICD-10-CM

## 2018-06-15 DIAGNOSIS — R1310 Dysphagia, unspecified: Secondary | ICD-10-CM | POA: Diagnosis present

## 2018-06-15 DIAGNOSIS — Z7951 Long term (current) use of inhaled steroids: Secondary | ICD-10-CM

## 2018-06-15 DIAGNOSIS — K8689 Other specified diseases of pancreas: Secondary | ICD-10-CM | POA: Diagnosis present

## 2018-06-15 DIAGNOSIS — B370 Candidal stomatitis: Secondary | ICD-10-CM | POA: Diagnosis not present

## 2018-06-15 DIAGNOSIS — Z90710 Acquired absence of both cervix and uterus: Secondary | ICD-10-CM

## 2018-06-15 DIAGNOSIS — E559 Vitamin D deficiency, unspecified: Secondary | ICD-10-CM | POA: Diagnosis present

## 2018-06-15 DIAGNOSIS — Z716 Tobacco abuse counseling: Secondary | ICD-10-CM

## 2018-06-15 DIAGNOSIS — Z888 Allergy status to other drugs, medicaments and biological substances status: Secondary | ICD-10-CM

## 2018-06-15 DIAGNOSIS — J9601 Acute respiratory failure with hypoxia: Secondary | ICD-10-CM | POA: Diagnosis present

## 2018-06-15 DIAGNOSIS — E876 Hypokalemia: Secondary | ICD-10-CM | POA: Diagnosis present

## 2018-06-15 DIAGNOSIS — M810 Age-related osteoporosis without current pathological fracture: Secondary | ICD-10-CM | POA: Diagnosis present

## 2018-06-15 DIAGNOSIS — Z881 Allergy status to other antibiotic agents status: Secondary | ICD-10-CM

## 2018-06-15 DIAGNOSIS — Z808 Family history of malignant neoplasm of other organs or systems: Secondary | ICD-10-CM

## 2018-06-15 DIAGNOSIS — I10 Essential (primary) hypertension: Secondary | ICD-10-CM | POA: Diagnosis not present

## 2018-06-15 DIAGNOSIS — F17210 Nicotine dependence, cigarettes, uncomplicated: Secondary | ICD-10-CM | POA: Diagnosis present

## 2018-06-15 DIAGNOSIS — Z85118 Personal history of other malignant neoplasm of bronchus and lung: Secondary | ICD-10-CM

## 2018-06-15 DIAGNOSIS — K219 Gastro-esophageal reflux disease without esophagitis: Secondary | ICD-10-CM | POA: Diagnosis present

## 2018-06-15 DIAGNOSIS — J441 Chronic obstructive pulmonary disease with (acute) exacerbation: Principal | ICD-10-CM | POA: Diagnosis present

## 2018-06-15 DIAGNOSIS — Z85028 Personal history of other malignant neoplasm of stomach: Secondary | ICD-10-CM

## 2018-06-15 LAB — I-STAT BETA HCG BLOOD, ED (MC, WL, AP ONLY): I-stat hCG, quantitative: 5.3 m[IU]/mL — ABNORMAL HIGH (ref <5)

## 2018-06-15 LAB — CBC WITH DIFFERENTIAL/PLATELET
Abs Immature Granulocytes: 0.18 10*3/uL — ABNORMAL HIGH (ref 0.00–0.07)
Basophils Absolute: 0 10*3/uL (ref 0.0–0.1)
Basophils Relative: 0 %
Eosinophils Absolute: 0 10*3/uL (ref 0.0–0.5)
Eosinophils Relative: 0 %
HCT: 41.2 % (ref 36.0–46.0)
Hemoglobin: 12.6 g/dL (ref 12.0–15.0)
Immature Granulocytes: 1 %
Lymphocytes Relative: 2 %
Lymphs Abs: 0.3 10*3/uL — ABNORMAL LOW (ref 0.7–4.0)
MCH: 28.7 pg (ref 26.0–34.0)
MCHC: 30.6 g/dL (ref 30.0–36.0)
MCV: 93.8 fL (ref 80.0–100.0)
Monocytes Absolute: 0.1 10*3/uL (ref 0.1–1.0)
Monocytes Relative: 1 %
Neutro Abs: 16.4 10*3/uL — ABNORMAL HIGH (ref 1.7–7.7)
Neutrophils Relative %: 96 %
Platelets: 280 10*3/uL (ref 150–400)
RBC: 4.39 MIL/uL (ref 3.87–5.11)
RDW: 15.4 % (ref 11.5–15.5)
WBC: 17 10*3/uL — ABNORMAL HIGH (ref 4.0–10.5)
nRBC: 0 % (ref 0.0–0.2)

## 2018-06-15 LAB — BASIC METABOLIC PANEL
Anion gap: 13 (ref 5–15)
BUN: 6 mg/dL (ref 6–20)
CO2: 31 mmol/L (ref 22–32)
Calcium: 8.9 mg/dL (ref 8.9–10.3)
Chloride: 99 mmol/L (ref 98–111)
Creatinine, Ser: 0.74 mg/dL (ref 0.44–1.00)
GFR calc Af Amer: >60 mL/min (ref >60)
GFR calc non Af Amer: >60 mL/min (ref >60)
Glucose, Bld: 114 mg/dL — ABNORMAL HIGH (ref 70–99)
Potassium: 3.3 mmol/L — ABNORMAL LOW (ref 3.5–5.1)
Sodium: 143 mmol/L (ref 135–145)

## 2018-06-15 LAB — I-STAT TROPONIN, ED: Troponin i, poc: 0 ng/mL (ref 0.00–0.08)

## 2018-06-15 LAB — I-STAT CG4 LACTIC ACID, ED: Lactic Acid, Venous: 1.48 mmol/L (ref 0.5–1.9)

## 2018-06-15 LAB — BRAIN NATRIURETIC PEPTIDE: B Natriuretic Peptide: 64.1 pg/mL (ref 0.0–100.0)

## 2018-06-15 MED ORDER — LEVOFLOXACIN IN D5W 500 MG/100ML IV SOLN
500.0000 mg | Freq: Once | INTRAVENOUS | Status: AC
  Administered 2018-06-15: 500 mg via INTRAVENOUS
  Filled 2018-06-15: qty 100

## 2018-06-15 MED ORDER — ALBUTEROL SULFATE (2.5 MG/3ML) 0.083% IN NEBU
5.0000 mg | INHALATION_SOLUTION | Freq: Once | RESPIRATORY_TRACT | Status: AC
  Administered 2018-06-15: 5 mg via RESPIRATORY_TRACT
  Filled 2018-06-15: qty 6

## 2018-06-15 MED ORDER — ALBUTEROL SULFATE (2.5 MG/3ML) 0.083% IN NEBU
2.5000 mg | INHALATION_SOLUTION | RESPIRATORY_TRACT | Status: DC
  Administered 2018-06-15: 2.5 mg via RESPIRATORY_TRACT
  Filled 2018-06-15: qty 3

## 2018-06-15 MED ORDER — PANCRELIPASE (LIP-PROT-AMYL) 12000-38000 UNITS PO CPEP
24000.0000 [IU] | ORAL_CAPSULE | Freq: Two times a day (BID) | ORAL | Status: DC
  Administered 2018-06-16 – 2018-06-18 (×5): 24000 [IU] via ORAL
  Filled 2018-06-15 (×6): qty 2

## 2018-06-15 MED ORDER — ALBUTEROL SULFATE (2.5 MG/3ML) 0.083% IN NEBU: 2.5000 mg | INHALATION_SOLUTION | RESPIRATORY_TRACT | Status: DC | PRN

## 2018-06-15 MED ORDER — ENOXAPARIN SODIUM 40 MG/0.4ML ~~LOC~~ SOLN: 40.0000 mg | SUBCUTANEOUS | Status: DC

## 2018-06-15 MED ORDER — AMLODIPINE BESYLATE 5 MG PO TABS
2.5000 mg | ORAL_TABLET | Freq: Every day | ORAL | Status: DC
  Administered 2018-06-16 – 2018-06-18 (×3): 2.5 mg via ORAL
  Filled 2018-06-15 (×4): qty 1

## 2018-06-15 MED ORDER — METHYLPREDNISOLONE SODIUM SUCC 125 MG IJ SOLR
125.0000 mg | Freq: Once | INTRAMUSCULAR | Status: AC
  Administered 2018-06-15: 125 mg via INTRAVENOUS
  Filled 2018-06-15: qty 2

## 2018-06-15 MED ORDER — MIRTAZAPINE 15 MG PO TABS
15.0000 mg | ORAL_TABLET | Freq: Every day | ORAL | Status: DC
  Administered 2018-06-16 – 2018-06-17 (×3): 15 mg via ORAL
  Filled 2018-06-15 (×3): qty 1

## 2018-06-15 MED ORDER — ACETAMINOPHEN 325 MG PO TABS
650.0000 mg | ORAL_TABLET | Freq: Once | ORAL | Status: AC
  Administered 2018-06-15: 650 mg via ORAL
  Filled 2018-06-15: qty 2

## 2018-06-15 MED ORDER — TIZANIDINE HCL 4 MG PO TABS
4.0000 mg | ORAL_TABLET | Freq: Three times a day (TID) | ORAL | Status: DC | PRN
  Administered 2018-06-18: 4 mg via ORAL
  Filled 2018-06-15: qty 1

## 2018-06-15 MED ORDER — ONDANSETRON HCL 4 MG/2ML IJ SOLN
4.0000 mg | Freq: Four times a day (QID) | INTRAMUSCULAR | Status: DC | PRN
  Administered 2018-06-16 – 2018-06-17 (×2): 4 mg via INTRAVENOUS
  Filled 2018-06-15 (×2): qty 2

## 2018-06-15 MED ORDER — METHYLPREDNISOLONE SODIUM SUCC 40 MG IJ SOLR
20.0000 mg | Freq: Two times a day (BID) | INTRAMUSCULAR | Status: DC
  Administered 2018-06-16 – 2018-06-17 (×3): 20 mg via INTRAVENOUS
  Filled 2018-06-15 (×3): qty 1

## 2018-06-15 MED ORDER — LEVOFLOXACIN IN D5W 250 MG/50ML IV SOLN: 250.0000 mg | Freq: Every day | INTRAVENOUS | Status: DC

## 2018-06-15 MED ORDER — IPRATROPIUM-ALBUTEROL 0.5-2.5 (3) MG/3ML IN SOLN
3.0000 mL | Freq: Four times a day (QID) | RESPIRATORY_TRACT | Status: DC
  Administered 2018-06-16 (×2): 3 mL via RESPIRATORY_TRACT
  Filled 2018-06-15 (×2): qty 3

## 2018-06-15 MED ORDER — SODIUM CHLORIDE 0.9 % IV BOLUS
500.0000 mL | Freq: Once | INTRAVENOUS | Status: AC
  Administered 2018-06-15: 500 mL via INTRAVENOUS

## 2018-06-15 MED ORDER — IPRATROPIUM BROMIDE 0.02 % IN SOLN
0.5000 mg | RESPIRATORY_TRACT | Status: DC
  Administered 2018-06-15: 0.5 mg via RESPIRATORY_TRACT
  Filled 2018-06-15: qty 2.5

## 2018-06-15 MED ORDER — ENOXAPARIN SODIUM 30 MG/0.3ML ~~LOC~~ SOLN
30.0000 mg | Freq: Every day | SUBCUTANEOUS | Status: DC
  Administered 2018-06-16 – 2018-06-17 (×3): 30 mg via SUBCUTANEOUS
  Filled 2018-06-15 (×3): qty 0.3

## 2018-06-15 MED ORDER — PREGABALIN 50 MG PO CAPS
150.0000 mg | ORAL_CAPSULE | Freq: Two times a day (BID) | ORAL | Status: DC
  Administered 2018-06-16 – 2018-06-18 (×6): 150 mg via ORAL
  Filled 2018-06-15: qty 3
  Filled 2018-06-15 (×2): qty 6
  Filled 2018-06-15 (×3): qty 3

## 2018-06-15 MED ORDER — LEVOFLOXACIN IN D5W 500 MG/100ML IV SOLN: 500.0000 mg | Freq: Once | INTRAVENOUS | Status: DC

## 2018-06-15 MED ORDER — ONDANSETRON HCL 4 MG PO TABS: 4.0000 mg | ORAL_TABLET | Freq: Four times a day (QID) | ORAL | Status: DC | PRN

## 2018-06-15 MED ORDER — ACETAMINOPHEN 650 MG RE SUPP: 650.0000 mg | Freq: Four times a day (QID) | RECTAL | Status: DC | PRN

## 2018-06-15 MED ORDER — BUDESONIDE 0.25 MG/2ML IN SUSP
0.2500 mg | Freq: Two times a day (BID) | RESPIRATORY_TRACT | Status: DC
  Administered 2018-06-15 – 2018-06-18 (×6): 0.25 mg via RESPIRATORY_TRACT
  Filled 2018-06-15 (×7): qty 2

## 2018-06-15 MED ORDER — ACETAMINOPHEN 325 MG PO TABS
650.0000 mg | ORAL_TABLET | Freq: Four times a day (QID) | ORAL | Status: DC | PRN
  Administered 2018-06-16 (×2): 650 mg via ORAL
  Filled 2018-06-15 (×2): qty 2

## 2018-06-15 MED ORDER — ALBUTEROL (5 MG/ML) CONTINUOUS INHALATION SOLN
10.0000 mg/h | INHALATION_SOLUTION | RESPIRATORY_TRACT | Status: DC
  Administered 2018-06-15: 10 mg/h via RESPIRATORY_TRACT
  Filled 2018-06-15: qty 20

## 2018-06-15 NOTE — ED Notes (Signed)
Patient transported to CT 

## 2018-06-15 NOTE — ED Notes (Signed)
Pt's daughter, Nila Nephew, can be reached at [phone] for updates regarding care.

## 2018-06-15 NOTE — ED Provider Notes (Signed)
[facility] DEPT Provider Note   CSN: [id number] Arrival date & time: 06/15/18  1641     History   Chief Complaint Chief Complaint  Patient presents with  . Shortness of Breath    HPI Claudia Wiley is a 62 y.o. female.  HPI Patient presents with 2 weeks of productive cough, shortness of breath.  States she is also had choking like episodes and drooping to her right eyelid for roughly the same period of time.  She endorses generalized weakness.  She is had subjective fevers and chills.  Does not wear oxygen at home.  Has a history of prior lung cancer with left upper lobectomy and COPD.  Has been using her nebulizer and rescue inhaler frequently with little relief.  Has been taking guaifenesin.  Past Medical History:  Diagnosis Date  . Anemia   . Arthritis   . Cancer [facility]) 2013   Upper left lung and stomach cancer  . COPD (chronic obstructive pulmonary disease) ([place])   . Esophageal stricture   . Family history of brain cancer   . Family history of melanoma   . Gastrinoma   . Gastrinoma   . GERD (gastroesophageal reflux disease)   . Hypertension   . Osteoporosis   . Pancreatic insufficiency   . Pneumonia   . Vitamin D deficiency   . Weight loss, unintentional     Patient Active Problem List   Diagnosis Date Noted  . Genetic testing 06/01/2018  . Gastrinoma   . Family history of melanoma   . Family history of brain cancer   . Hypokalemia 10/19/2011  . Severe protein-calorie malnutrition ([place]) 10/19/2011  . Weight loss 10/19/2011  . Generalized weakness 10/19/2011  . Dysphagia 10/19/2011  . Anodontia 10/19/2011    Past Surgical History:  Procedure Laterality Date  . ABDOMINAL HYSTERECTOMY    . BREAST LUMPECTOMY    . CESAREAN SECTION    . LUNG SURGERY       OB History   None      Home Medications    Prior to Admission medications   Medication Sig Start Date End Date Taking? Authorizing Provider  albuterol (PROVENTIL  HFA;VENTOLIN HFA) 108 (90 Base) MCG/ACT inhaler Inhale into the lungs every 6 (six) hours as needed for wheezing or shortness of breath.   Yes [provider]  amLODipine (NORVASC) 2.5 MG tablet Take 2.5 mg by mouth daily.   Yes [provider]  budesonide-formoterol (SYMBICORT) 160-4.5 MCG/ACT inhaler Inhale 1 puff into the lungs daily.    Yes [provider]  cholecalciferol (VITAMIN D) 400 units TABS tablet Take 800 Units by mouth.   Yes [provider]  cholecalciferol (VITAMIN D3) 25 MCG (1000 UT) tablet Take 1,000 Units by mouth daily.   Yes [provider]  Cyanocobalamin (VITAMIN B-12 IJ) Inject as directed every 28 (twenty-eight) days.   Yes [provider]  ENSURE (ENSURE) Take 237 mLs by mouth 3 (three) times daily between meals.   Yes [provider]  lipase/protease/amylase (CREON) 12000 units CPEP capsule Take 24,000 Units by mouth 2 (two) times daily.    Yes [provider]  mirtazapine (REMERON) 15 MG tablet Take 15 mg by mouth at bedtime.   Yes [provider]  Multiple Vitamin (MULTIVITAMIN) tablet Take 1 tablet by mouth daily.   Yes [provider]  pantoprazole (PROTONIX) 40 MG tablet Take 40 mg by mouth daily.   Yes [provider]  pregabalin (LYRICA) 150  MG capsule Take 150 mg by mouth 2 (two) times daily.   Yes [provider]  esomeprazole (NEXIUM) 40 MG capsule Take 1 capsule (40 mg total) by mouth daily before breakfast. Patient not taking: Reported on 06/15/2018 10/22/11   Verlee Monte, MD  promethazine (PHENERGAN) 25 MG tablet Take 25 mg by mouth every 6 (six) hours as needed for nausea or vomiting.    [provider]  tiZANidine (ZANAFLEX) 4 MG capsule Take 4 mg by mouth 3 (three) times daily as needed for muscle spasms.     [provider]    Family History Family History  Problem Relation Age of Onset  . Diabetes Sister        x 3  . Cancer  Sister 31       brain that metastized to bone, stomach and other organs  . Melanoma Maternal Aunt   . Heart attack Father   . Pneumonia Brother   . Cancer Other 17       cancer in his chest - nephew  . Colon cancer Neg Hx   . Pancreatic cancer Neg Hx   . Rectal cancer Neg Hx   . Esophageal cancer Neg Hx     Social History Social History   Tobacco Use  . Smoking status: Current Every Day Smoker    Packs/day: 0.25    Years: 2.00    Pack years: 0.50    Types: Cigarettes  . Smokeless tobacco: Never Used  . Tobacco comment: Not ready  Substance Use Topics  . Alcohol use: No  . Drug use: No     Allergies   Azithromycin and Gabapentin   Review of Systems Review of Systems  Constitutional: Positive for chills, fatigue and fever.  HENT: Positive for trouble swallowing. Negative for facial swelling and sore throat.   Eyes: Negative for visual disturbance.  Respiratory: Positive for cough and shortness of breath.   Cardiovascular: Negative for chest pain.  Gastrointestinal: Negative for abdominal pain, constipation, diarrhea, nausea and vomiting.  Musculoskeletal: Negative for back pain, myalgias, neck pain and neck stiffness.  Skin: Negative for rash and wound.  Neurological: Positive for weakness and headaches. Negative for dizziness, speech difficulty, light-headedness and numbness.  All other systems reviewed and are negative.    Physical Exam Updated Vital Signs BP 113/68   Pulse 100   Temp 98 F (36.7 C) (Oral)   Resp 16   SpO2 100%   Physical Exam  Constitutional: She is oriented to person, place, and time. She appears well-developed and well-nourished. No distress.  HENT:  Head: Normocephalic and atraumatic.  Mouth/Throat: Oropharynx is clear and moist.  Drooping right eyelid.  Questionable right lower facial droop.  Eyes: Pupils are equal, round, and reactive to light. EOM are normal.  Neck: Normal range of motion. Neck supple. No JVD present.    Cardiovascular: Normal rate and regular rhythm. Exam reveals no gallop and no friction rub.  No murmur heard. Pulmonary/Chest: No respiratory distress. She has no wheezes. She has no rales.  Tachypnea.  Increased respiratory effort.  Diminished breath sounds throughout with end expiratory wheezing.  Abdominal: Soft. Bowel sounds are normal. She exhibits no distension and no mass. There is no tenderness. There is no rebound and no guarding. No hernia.  Musculoskeletal: Normal range of motion. She exhibits no edema or tenderness.  5/5 bilateral grip strengths.  2/5 bilateral lower extremity motor.  Sensation intact.  Lymphadenopathy:    She has no cervical adenopathy.  Neurological: She is alert and oriented to person, place, and time.  Skin: Skin is warm and dry. Capillary refill takes less than 2 seconds. No rash noted. She is not diaphoretic. No erythema.  Psychiatric: She has a normal mood and affect. Her behavior is normal.  Nursing note and vitals reviewed.    ED Treatments / Results  Labs (all labs ordered are listed, but only abnormal results are displayed) Labs Reviewed  BASIC METABOLIC PANEL - Abnormal; Notable for the following components:      Result Value   Potassium 3.3 (*)    Glucose, Bld 114 (*)    All other components within normal limits  I-STAT BETA HCG BLOOD, ED ([facility], [facility], [facility] ONLY) - Abnormal; Notable for the following components:   I-stat hCG, quantitative 5.3 (*)    All other components within normal limits  CULTURE, BLOOD (ROUTINE X 2)  CULTURE, BLOOD (ROUTINE X 2)  BRAIN NATRIURETIC PEPTIDE  CBC  I-STAT TROPONIN, ED  I-STAT CG4 LACTIC ACID, ED    EKG EKG Interpretation  Date/Time:  Monday June 15 2018 17:06:13 EST Ventricular Rate:  85 PR Interval:    QRS Duration: 69 QT Interval:  484 QTC Calculation: 573 R Axis:   86 Text Interpretation:  Sinus rhythm Ventricular premature complex Biatrial enlargement Borderline right axis deviation  Nonspecific T abnormalities, lateral leads Prolonged QT interval Confirmed by Julianne Rice [phone]) on 06/15/2018 5:36:07 PM   Radiology Ct Head Wo Contrast  Result Date: 06/15/2018 CLINICAL DATA:  Focal neurologic deficit. Stroke suspected. Weakness and dizziness. EXAM: CT HEAD WITHOUT CONTRAST TECHNIQUE: Contiguous axial images were obtained from the base of the skull through the vertex without intravenous contrast. COMPARISON:  None. FINDINGS: Brain: No evidence of acute infarction, hemorrhage, hydrocephalus, extra-axial collection or mass lesion/mass effect. Mild age related involutional changes of brain. Vascular: No hyperdense vessel or unexpected calcification. Skull: Normal. Negative for fracture or focal lesion. Sinuses/Orbits: No acute finding. Other: None. IMPRESSION: Mild age related involutional changes of the brain without acute intracranial abnormality identified on CT. Electronically Signed   By: Ashley Royalty M.D.   On: 06/15/2018 18:27   Dg Chest Port 1 View  Result Date: 06/15/2018 CLINICAL DATA:  Shortness of breath for 7 days. Status post left upper lobectomy for cancer. EXAM: PORTABLE CHEST 1 VIEW COMPARISON:  October 19, 2011 FINDINGS: The patient is status post left upper lobectomy. Hyperinflation of the lungs. Blunting of the left costophrenic angle is noted. No other acute abnormalities. The cardiomediastinal silhouette is stable. The lungs are otherwise clear. IMPRESSION: 1. Previous left upper lobectomy. 2. Blunting of the left costophrenic angle could represent sequela of previous surgery versus a small effusion with atelectasis. 3. No other acute abnormalities. Electronically Signed   By: Dorise Bullion III M.D   On: 06/15/2018 18:39    Procedures Procedures (including critical care time)  Medications Ordered in ED Medications  albuterol (PROVENTIL,VENTOLIN) solution continuous neb (0 mg/hr Nebulization Stopped 06/15/18 2029)  albuterol (PROVENTIL) (2.5 MG/3ML) 0.083%  nebulizer solution 5 mg (5 mg Nebulization Given 06/15/18 1720)  methylPREDNISolone sodium succinate (SOLU-MEDROL) 125 mg/2 mL injection 125 mg (125 mg Intravenous Given 06/15/18 1758)  sodium chloride 0.9 % bolus 500 mL (0 mLs Intravenous Stopped 06/15/18 1906)  acetaminophen (TYLENOL) tablet 650 mg (650 mg Oral Given 06/15/18 1940)  levofloxacin (LEVAQUIN) IVPB 500 mg (0 mg Intravenous Stopped 06/15/18 2029)     Initial Impression / Assessment and Plan / ED Course  I have reviewed  the triage vital signs and the nursing notes.  Pertinent labs & imaging results that were available during my care of the patient were reviewed by me and considered in my medical decision making (see chart for details).     CT head without acute findings.  Chest x-ray with no evidence of pneumonia.  Patient does have some respiratory improvement after continuous nebulized treatment and Solu-Medrol.  Also given Levaquin for concern for infectious cause of her COPD exacerbation.  Discussed with hospitalist regarding admission.  Final Clinical Impressions(s) / ED Diagnoses   Final diagnoses:  COPD exacerbation [facility])    ED Discharge Orders    None       Julianne Rice, MD 06/15/18 2121

## 2018-06-15 NOTE — ED Notes (Signed)
Pt's daughter is expressing frustration regarding the communication between them and EDP. EDP has been notified to give update to patient regarding plan of care.

## 2018-06-15 NOTE — ED Notes (Signed)
ED TO INPATIENT HANDOFF REPORT  Name/Age/Gender Claudia Wiley 62 y.o. female  Code Status    Code Status Orders  (From admission, onward)         Start     Ordered   06/15/18 2151  Full code  Continuous     06/15/18 2152        Code Status History    This patient has a current code status but no historical code status.    Advance Directive Documentation     Most Recent Value  Type of Advance Directive  Healthcare Power of Attorney, Living will  Pre-existing out of facility DNR order (yellow form or pink MOST form)  -  "MOST" Form in Place?  -      Home/SNF/Other Home  Chief Complaint shob  Level of Care/Admitting Diagnosis ED Disposition    ED Disposition Condition [place]: [facility] [100102]  Level of Care: Telemetry [5]  Admit to tele based on following criteria: Monitor for Ischemic changes  Diagnosis: COPD exacerbation [facility]) [[id number]]  Admitting Physician: Rise Patience [phone]  Attending Physician: Rise Patience [email or web address]  PT Class (Do Not Modify): Observation [104]  PT Acc Code (Do Not Modify): Observation [10022]       Medical History Past Medical History:  Diagnosis Date  . Anemia   . Arthritis   . Cancer [facility]) 2013   Upper left lung and stomach cancer  . COPD (chronic obstructive pulmonary disease) ([place])   . Esophageal stricture   . Family history of brain cancer   . Family history of melanoma   . Gastrinoma   . Gastrinoma   . GERD (gastroesophageal reflux disease)   . Hypertension   . Osteoporosis   . Pancreatic insufficiency   . Pneumonia   . Vitamin D deficiency   . Weight loss, unintentional     Allergies Allergies  Allergen Reactions  . Azithromycin   . Gabapentin     IV Location/Drains/Wounds Patient Lines/Drains/Airways Status   Active Line/Drains/Airways    Name:   Placement date:   Placement time:   Site:   Days:   Peripheral IV 06/15/18 Left;Upper Arm   06/15/18     1705    Arm   less than 1   Peripheral IV 06/15/18 Right Antecubital   06/15/18    1754    Antecubital   less than 1          Labs/Imaging Results for orders placed or performed during the hospital encounter of 06/15/18 (from the past 48 hour(s))  Basic metabolic panel     Status: Abnormal   Collection Time: 06/15/18  5:08 PM  Result Value Ref Range   Sodium 143 135 - 145 mmol/L   Potassium 3.3 (L) 3.5 - 5.1 mmol/L   Chloride 99 98 - 111 mmol/L   CO2 31 22 - 32 mmol/L   Glucose, Bld 114 (H) 70 - 99 mg/dL   BUN 6 6 - 20 mg/dL   Creatinine, Ser 0.74 0.44 - 1.00 mg/dL   Calcium 8.9 8.9 - 10.3 mg/dL   GFR calc non Af Amer >60 >60 mL/min   GFR calc Af Amer >60 >60 mL/min   Anion gap 13 5 - 15    Comment: Performed at [facility], [place] [address] [place], [place] [address]  I-stat troponin, ED     Status: None   Collection Time: 06/15/18  5:14 PM  Result  Value Ref Range   Troponin i, poc 0.00 0.00 - 0.08 ng/mL   Comment 3            Comment: Due to the release kinetics of cTnI, a negative result within the first hours of the onset of symptoms does not rule out myocardial infarction with certainty. If myocardial infarction is still suspected, repeat the test at appropriate intervals.   I-Stat beta hCG blood, ED     Status: Abnormal   Collection Time: 06/15/18  5:14 PM  Result Value Ref Range   I-stat hCG, quantitative 5.3 (H) <5 mIU/mL   Comment 3            Comment:   GEST. AGE      CONC.  (mIU/mL)   <=1 WEEK        5 - 50     2 WEEKS       50 - 500     3 WEEKS       100 - 10,000     4 WEEKS     1,000 - 30,000        FEMALE AND NON-PREGNANT FEMALE:     LESS THAN 5 mIU/mL   Brain natriuretic peptide     Status: None   Collection Time: 06/15/18  5:25 PM  Result Value Ref Range   B Natriuretic Peptide 64.1 0.0 - 100.0 pg/mL    Comment: Performed at [facility], [place] [address], [place], [place] [address]  I-Stat CG4 Lactic Acid, ED      Status: None   Collection Time: 06/15/18  5:59 PM  Result Value Ref Range   Lactic Acid, Venous 1.48 0.5 - 1.9 mmol/L   Ct Head Wo Contrast  Result Date: 06/15/2018 CLINICAL DATA:  Focal neurologic deficit. Stroke suspected. Weakness and dizziness. EXAM: CT HEAD WITHOUT CONTRAST TECHNIQUE: Contiguous axial images were obtained from the base of the skull through the vertex without intravenous contrast. COMPARISON:  None. FINDINGS: Brain: No evidence of acute infarction, hemorrhage, hydrocephalus, extra-axial collection or mass lesion/mass effect. Mild age related involutional changes of brain. Vascular: No hyperdense vessel or unexpected calcification. Skull: Normal. Negative for fracture or focal lesion. Sinuses/Orbits: No acute finding. Other: None. IMPRESSION: Mild age related involutional changes of the brain without acute intracranial abnormality identified on CT. Electronically Signed   By: Ashley Royalty M.D.   On: 06/15/2018 18:27   Dg Chest Port 1 View  Result Date: 06/15/2018 CLINICAL DATA:  Shortness of breath for 7 days. Status post left upper lobectomy for cancer. EXAM: PORTABLE CHEST 1 VIEW COMPARISON:  October 19, 2011 FINDINGS: The patient is status post left upper lobectomy. Hyperinflation of the lungs. Blunting of the left costophrenic angle is noted. No other acute abnormalities. The cardiomediastinal silhouette is stable. The lungs are otherwise clear. IMPRESSION: 1. Previous left upper lobectomy. 2. Blunting of the left costophrenic angle could represent sequela of previous surgery versus a small effusion with atelectasis. 3. No other acute abnormalities. Electronically Signed   By: Dorise Bullion III M.D   On: 06/15/2018 18:39    Pending Labs Unresulted Labs (From admission, onward)    Start     Ordered   06/22/18 0500  Creatinine, serum  (enoxaparin (LOVENOX)    CrCl >/= 30 ml/min)  Weekly,   R    Comments:  while on enoxaparin therapy    06/15/18 2152   06/16/18 0500  HIV  antibody (Routine Testing)  Tomorrow morning,  R     06/15/18 2152   06/16/18 9675  Basic metabolic panel  Tomorrow morning,   R     06/15/18 2152   06/16/18 0500  CBC  Tomorrow morning,   R     06/15/18 2152   06/15/18 2206  Troponin I - Once  Once,   R     06/15/18 2205   06/15/18 2206  CBC with Differential/Platelet  ONCE - STAT,   R     06/15/18 2205   06/15/18 2206  Hepatic function panel  Once,   R     06/15/18 2205   06/15/18 2206  Lipase, blood  Once,   R     06/15/18 2205   06/15/18 2150  CBC  (enoxaparin (LOVENOX)    CrCl >/= 30 ml/min)  Once,   R    Comments:  Baseline for enoxaparin therapy IF NOT ALREADY DRAWN.  Notify MD if PLT < 100 K.    06/15/18 2152   06/15/18 2150  Creatinine, serum  (enoxaparin (LOVENOX)    CrCl >/= 30 ml/min)  Once,   R    Comments:  Baseline for enoxaparin therapy IF NOT ALREADY DRAWN.    06/15/18 2152   06/15/18 1728  Culture, blood (Routine X 2) w Reflex to ID Panel  BLOOD CULTURE X 2,   STAT     06/15/18 1731          Vitals/Pain Today's Vitals   06/15/18 2115 06/15/18 2130 06/15/18 2145 06/15/18 2200  BP: 113/68 115/72 122/71 112/79  Pulse: 100 (!) 103 (!) 102 (!) 101  Resp: 16 18 16 16   Temp:      TempSrc:      SpO2: 100% 100% 100% 100%  PainSc:        Isolation Precautions No active isolations  Medications Medications  amLODipine (NORVASC) tablet 2.5 mg (has no administration in time range)  mirtazapine (REMERON) tablet 15 mg (has no administration in time range)  lipase/protease/amylase (CREON) capsule 24,000 Units (has no administration in time range)  pregabalin (LYRICA) capsule 150 mg (has no administration in time range)  tiZANidine (ZANAFLEX) capsule 4 mg (has no administration in time range)  acetaminophen (TYLENOL) tablet 650 mg (has no administration in time range)    Or  acetaminophen (TYLENOL) suppository 650 mg (has no administration in time range)  ondansetron (ZOFRAN) tablet 4 mg (has no administration  in time range)    Or  ondansetron (ZOFRAN) injection 4 mg (has no administration in time range)  albuterol (PROVENTIL) (2.5 MG/3ML) 0.083% nebulizer solution 2.5 mg (has no administration in time range)  albuterol (PROVENTIL) (2.5 MG/3ML) 0.083% nebulizer solution 2.5 mg (has no administration in time range)  ipratropium (ATROVENT) nebulizer solution 0.5 mg (has no administration in time range)  budesonide (PULMICORT) nebulizer solution 0.25 mg (has no administration in time range)  methylPREDNISolone sodium succinate (SOLU-MEDROL) 40 mg/mL injection 20 mg (has no administration in time range)  enoxaparin (LOVENOX) injection 30 mg (has no administration in time range)  levofloxacin (LEVAQUIN) IVPB 500 mg (has no administration in time range)    Followed by  Levofloxacin (LEVAQUIN) IVPB 250 mg (has no administration in time range)  albuterol (PROVENTIL) (2.5 MG/3ML) 0.083% nebulizer solution 5 mg (5 mg Nebulization Given 06/15/18 1720)  methylPREDNISolone sodium succinate (SOLU-MEDROL) 125 mg/2 mL injection 125 mg (125 mg Intravenous Given 06/15/18 1758)  sodium chloride 0.9 % bolus 500 mL (0 mLs Intravenous Stopped 06/15/18 1906)  acetaminophen (TYLENOL) tablet  650 mg (650 mg Oral Given 06/15/18 1940)  levofloxacin (LEVAQUIN) IVPB 500 mg (0 mg Intravenous Stopped 06/15/18 2029)    Mobility walks

## 2018-06-15 NOTE — ED Notes (Signed)
Attempted to give report x2. Was advised to call back.

## 2018-06-15 NOTE — ED Triage Notes (Signed)
Patient has been shob X7 days.  85% on room air 91% on 2 litters Crackles and wheezes on assessment Upper left lobe removed due to cancer.  Patient is not on home 02.

## 2018-06-15 NOTE — H&P (Signed)
History and Physical    Claudia Wiley KYH:[id number] DOB: 11-13-1955 DOA: 06/15/2018  PCP: System, Pcp Not In  Patient coming from: Home.  Chief Complaint: Shortness of breath.  HPI: Claudia Wiley is a 62 y.o. female with history of COPD, hypertension, ongoing tobacco abuse, previous history of lung cancer status post lobectomy, history of pancreatic cancer as per the patient is status post surgery presents to the ER because of shortness of breath.  Patient states patient has been getting short of breath for the last 2 weeks and has been having some productive cough.  Has some epigastric pain on coughing.  Today's denies any vomiting or diarrhea  ED Course: In the ER patient is found to be wheezing with chest x-ray showing nothing acute.  EKG was showing nothing acute.  Troponins were negative.  Patient was given nebulizer treatment and antibiotics and admitted for COPD exacerbation.  Review of Systems: As per HPI, rest all negative.   Past Medical History:  Diagnosis Date  . Anemia   . Arthritis   . Cancer [facility]) 2013   Upper left lung and stomach cancer  . COPD (chronic obstructive pulmonary disease) ([place])   . Esophageal stricture   . Family history of brain cancer   . Family history of melanoma   . Gastrinoma   . Gastrinoma   . GERD (gastroesophageal reflux disease)   . Hypertension   . Osteoporosis   . Pancreatic insufficiency   . Pneumonia   . Vitamin D deficiency   . Weight loss, unintentional     Past Surgical History:  Procedure Laterality Date  . ABDOMINAL HYSTERECTOMY    . BREAST LUMPECTOMY    . CESAREAN SECTION    . LUNG SURGERY       reports that she has been smoking cigarettes. She has a 0.50 pack-year smoking history. She has never used smokeless tobacco. She reports that she does not drink alcohol or use drugs.  Allergies  Allergen Reactions  . Azithromycin   . Gabapentin     Family History  Problem Relation Age of Onset  . Diabetes Sister        x  3  . Cancer Sister 73       brain that metastized to bone, stomach and other organs  . Melanoma Maternal Aunt   . Heart attack Father   . Pneumonia Brother   . Cancer Other 17       cancer in his chest - nephew  . Colon cancer Neg Hx   . Pancreatic cancer Neg Hx   . Rectal cancer Neg Hx   . Esophageal cancer Neg Hx     Prior to Admission medications   Medication Sig Start Date End Date Taking? Authorizing Provider  albuterol (PROVENTIL HFA;VENTOLIN HFA) 108 (90 Base) MCG/ACT inhaler Inhale into the lungs every 6 (six) hours as needed for wheezing or shortness of breath.   Yes [provider]  amLODipine (NORVASC) 2.5 MG tablet Take 2.5 mg by mouth daily.   Yes [provider]  budesonide-formoterol (SYMBICORT) 160-4.5 MCG/ACT inhaler Inhale 1 puff into the lungs daily.    Yes [provider]  cholecalciferol (VITAMIN D) 400 units TABS tablet Take 800 Units by mouth.   Yes [provider]  cholecalciferol (VITAMIN D3) 25 MCG (1000 UT) tablet Take 1,000 Units by mouth daily.   Yes [provider]  Cyanocobalamin (VITAMIN B-12 IJ) Inject as directed every 28 (twenty-eight) days.   Yes [provider]  ENSURE (ENSURE) Take 237 mLs by mouth 3 (three) times daily between meals.   Yes [provider]  lipase/protease/amylase (CREON) 12000 units CPEP capsule Take 24,000 Units by mouth 2 (two) times daily.    Yes [provider]  mirtazapine (REMERON) 15 MG tablet Take 15 mg by mouth at bedtime.   Yes [provider]  Multiple Vitamin (MULTIVITAMIN) tablet Take 1 tablet by mouth daily.   Yes [provider]  pantoprazole (PROTONIX) 40 MG tablet Take 40 mg by mouth daily.   Yes [provider]  pregabalin (LYRICA) 150 MG capsule Take 150 mg by mouth 2 (two) times daily.   Yes [provider]  esomeprazole (NEXIUM) 40 MG capsule Take 1 capsule (40 mg total) by mouth daily before  breakfast. Patient not taking: Reported on 06/15/2018 10/22/11   Claudia Monte, MD  promethazine (PHENERGAN) 25 MG tablet Take 25 mg by mouth every 6 (six) hours as needed for nausea or vomiting.    [provider]  tiZANidine (ZANAFLEX) 4 MG capsule Take 4 mg by mouth 3 (three) times daily as needed for muscle spasms.     [provider]    Physical Exam: Vitals:   06/15/18 2030 06/15/18 2100 06/15/18 2115 06/15/18 2130  BP: 123/78 103/71 113/68 115/72  Pulse: (!) 117 100 100 (!) 103  Resp: 20 19 16 18   Temp:      TempSrc:      SpO2: 99% 100% 100% 100%      Constitutional: Moderately built and nourished. Vitals:   06/15/18 2030 06/15/18 2100 06/15/18 2115 06/15/18 2130  BP: 123/78 103/71 113/68 115/72  Pulse: (!) 117 100 100 (!) 103  Resp: 20 19 16 18   Temp:      TempSrc:      SpO2: 99% 100% 100% 100%   Eyes: Anicteric no pallor. ENMT: No discharge from the ears eyes nose or mouth. Neck: No mass felt.  No neck rigidity.  No JVD appreciated. Respiratory: Bilateral expiratory wheeze and no crepitations. Cardiovascular: S1-S2 heard no murmurs appreciated. Abdomen: Soft nontender bowel sounds present. Musculoskeletal: No edema. Skin: No rash. Neurologic: Alert awake oriented to time place and person.  Moves all extremities. Psychiatric: Appears normal.  Normal affect.   Labs on Admission: I have personally reviewed following labs and imaging studies  CBC: No results for input(s): WBC, NEUTROABS, HGB, HCT, MCV, PLT in the last 168 hours. Basic Metabolic Panel: Recent Labs  Lab 06/15/18 1708  NA 143  K 3.3*  CL 99  CO2 31  GLUCOSE 114*  BUN 6  CREATININE 0.74  CALCIUM 8.9   GFR: CrCl cannot be calculated (Unknown ideal weight.). Liver Function Tests: No results for input(s): AST, ALT, ALKPHOS, BILITOT, PROT, ALBUMIN in the last 168 hours. No results for input(s): LIPASE, AMYLASE in the last 168 hours. No results for input(s): AMMONIA in the  last 168 hours. Coagulation Profile: No results for input(s): INR, PROTIME in the last 168 hours. Cardiac Enzymes: No results for input(s): CKTOTAL, CKMB, CKMBINDEX, TROPONINI in the last 168 hours. BNP (last 3 results) No results for input(s): PROBNP in the last 8760 hours. HbA1C: No results for input(s): HGBA1C in the last 72 hours. CBG: No results for input(s): GLUCAP in the last 168 hours. Lipid Profile: No results for input(s): CHOL, HDL, LDLCALC, TRIG, CHOLHDL, LDLDIRECT in the last 72 hours. Thyroid Function Tests: No results for input(s): TSH, T4TOTAL, FREET4, T3FREE, THYROIDAB in the last 72 hours.  Anemia Panel: No results for input(s): VITAMINB12, FOLATE, FERRITIN, TIBC, IRON, RETICCTPCT in the last 72 hours. Urine analysis:    Component Value Date/Time   COLORURINE YELLOW 12/27/2012 [place] 12/27/2012 0454   LABSPEC 1.042 (H) 12/27/2012 0454   PHURINE 6.0 12/27/2012 0454   GLUCOSEU NEGATIVE 12/27/2012 0454   HGBUR NEGATIVE 12/27/2012 0454   BILIRUBINUR NEGATIVE 12/27/2012 0454   KETONESUR >80 (A) 12/27/2012 0454   PROTEINUR 30 (A) 12/27/2012 0454   UROBILINOGEN 0.2 12/27/2012 0454   NITRITE NEGATIVE 12/27/2012 0454   LEUKOCYTESUR NEGATIVE 12/27/2012 0454   Sepsis Labs: @LABRCNTIP (procalcitonin:4,lacticidven:4) )No results found for this or any previous visit (from the past 240 hour(s)).   Radiological Exams on Admission: Ct Head Wo Contrast  Result Date: 06/15/2018 CLINICAL DATA:  Focal neurologic deficit. Stroke suspected. Weakness and dizziness. EXAM: CT HEAD WITHOUT CONTRAST TECHNIQUE: Contiguous axial images were obtained from the base of the skull through the vertex without intravenous contrast. COMPARISON:  None. FINDINGS: Brain: No evidence of acute infarction, hemorrhage, hydrocephalus, extra-axial collection or mass lesion/mass effect. Mild age related involutional changes of brain. Vascular: No hyperdense vessel or unexpected  calcification. Skull: Normal. Negative for fracture or focal lesion. Sinuses/Orbits: No acute finding. Other: None. IMPRESSION: Mild age related involutional changes of the brain without acute intracranial abnormality identified on CT. Electronically Signed   By: Ashley Royalty M.D.   On: 06/15/2018 18:27   Dg Chest Port 1 View  Result Date: 06/15/2018 CLINICAL DATA:  Shortness of breath for 7 days. Status post left upper lobectomy for cancer. EXAM: PORTABLE CHEST 1 VIEW COMPARISON:  October 19, 2011 FINDINGS: The patient is status post left upper lobectomy. Hyperinflation of the lungs. Blunting of the left costophrenic angle is noted. No other acute abnormalities. The cardiomediastinal silhouette is stable. The lungs are otherwise clear. IMPRESSION: 1. Previous left upper lobectomy. 2. Blunting of the left costophrenic angle could represent sequela of previous surgery versus a small effusion with atelectasis. 3. No other acute abnormalities. Electronically Signed   By: Dorise Bullion III M.D   On: 06/15/2018 18:39    EKG: Independently reviewed.  Normal sinus rhythm.  Assessment/Plan Principal Problem:   COPD exacerbation (HCC) Active Problems:   Essential hypertension    1. Acute respiratory failure with hypoxia secondary to COPD exacerbation -patient has been placed on IV Solu-Medrol Pulmicort Levaquin duo nebs.  Closely follow respiratory status and telemetry. 2. Hypertension on amlodipine. 3. Tobacco abuse -advised to quit smoking. 4. History of lung cancer status post lobectomy as per the patient. 5. History of pancreatic cancer status post resection per the patient. 6. Mild hypokalemia replace recheck.  CBC, LFT pending.   DVT prophylaxis: Lovenox. Code Status: Full code. Family Communication: Discussed with patient. Disposition Plan: Home. Consults called: None. Admission status: Observation.   Rise Patience MD [facility] Pager [phone].  If 7PM-7AM, please  contact night-coverage [email or web address] Password [facility]  06/15/2018, 9:52 PM

## 2018-06-15 NOTE — Progress Notes (Signed)
Pharmacy Antibiotic Note  Claudia Wiley is a 62 y.o. female admitted on 06/15/2018 with AECOPD.  Pharmacy has been consulted for Levaquin dosing.  Plan:  Based on most recent SCr, estimating CrCl in the 20-50 ml/min range  Levaquin 500 mg IV x 1, then 250 mg daily thereafter  Recommended duration 5d for AECOPD  Would consider non-FQ agent (doxycycline, azithromycin) as no obvious risk factors for pseudomonas seen. Allergy reported to azithromycin but no reaction given     Temp (24hrs), Avg:98 F (36.7 C), Min:98 F (36.7 C), Max:98 F (36.7 C)  Recent Labs  Lab 06/15/18 1708 06/15/18 1759  CREATININE 0.74  --   LATICACIDVEN  --  1.48    CrCl cannot be calculated (Unknown ideal weight.).    Allergies  Allergen Reactions  . Azithromycin   . Gabapentin     Thank you for allowing pharmacy to be a part of this patient's care.  Claudia Wiley A 06/15/2018 10:04 PM

## 2018-06-15 NOTE — ED Notes (Signed)
Repeat EKG given to EDP,[place],MD. For review.

## 2018-06-16 ENCOUNTER — Other Ambulatory Visit: Payer: Self-pay

## 2018-06-16 DIAGNOSIS — Z7951 Long term (current) use of inhaled steroids: Secondary | ICD-10-CM | POA: Diagnosis not present

## 2018-06-16 DIAGNOSIS — Z716 Tobacco abuse counseling: Secondary | ICD-10-CM | POA: Diagnosis not present

## 2018-06-16 DIAGNOSIS — J9601 Acute respiratory failure with hypoxia: Secondary | ICD-10-CM | POA: Diagnosis present

## 2018-06-16 DIAGNOSIS — Z79899 Other long term (current) drug therapy: Secondary | ICD-10-CM | POA: Diagnosis not present

## 2018-06-16 DIAGNOSIS — K8689 Other specified diseases of pancreas: Secondary | ICD-10-CM | POA: Diagnosis present

## 2018-06-16 DIAGNOSIS — M810 Age-related osteoporosis without current pathological fracture: Secondary | ICD-10-CM | POA: Diagnosis present

## 2018-06-16 DIAGNOSIS — Z888 Allergy status to other drugs, medicaments and biological substances status: Secondary | ICD-10-CM | POA: Diagnosis not present

## 2018-06-16 DIAGNOSIS — Z90710 Acquired absence of both cervix and uterus: Secondary | ICD-10-CM | POA: Diagnosis not present

## 2018-06-16 DIAGNOSIS — B370 Candidal stomatitis: Secondary | ICD-10-CM | POA: Diagnosis not present

## 2018-06-16 DIAGNOSIS — E559 Vitamin D deficiency, unspecified: Secondary | ICD-10-CM | POA: Diagnosis present

## 2018-06-16 DIAGNOSIS — K219 Gastro-esophageal reflux disease without esophagitis: Secondary | ICD-10-CM | POA: Diagnosis present

## 2018-06-16 DIAGNOSIS — J441 Chronic obstructive pulmonary disease with (acute) exacerbation: Secondary | ICD-10-CM | POA: Diagnosis present

## 2018-06-16 DIAGNOSIS — I10 Essential (primary) hypertension: Secondary | ICD-10-CM | POA: Diagnosis present

## 2018-06-16 DIAGNOSIS — Z902 Acquired absence of lung [part of]: Secondary | ICD-10-CM | POA: Diagnosis not present

## 2018-06-16 DIAGNOSIS — R1310 Dysphagia, unspecified: Secondary | ICD-10-CM | POA: Diagnosis present

## 2018-06-16 DIAGNOSIS — Z85118 Personal history of other malignant neoplasm of bronchus and lung: Secondary | ICD-10-CM | POA: Diagnosis not present

## 2018-06-16 DIAGNOSIS — F17210 Nicotine dependence, cigarettes, uncomplicated: Secondary | ICD-10-CM | POA: Diagnosis present

## 2018-06-16 DIAGNOSIS — Z85028 Personal history of other malignant neoplasm of stomach: Secondary | ICD-10-CM | POA: Diagnosis not present

## 2018-06-16 DIAGNOSIS — E876 Hypokalemia: Secondary | ICD-10-CM | POA: Diagnosis present

## 2018-06-16 DIAGNOSIS — Z881 Allergy status to other antibiotic agents status: Secondary | ICD-10-CM | POA: Diagnosis not present

## 2018-06-16 DIAGNOSIS — Z808 Family history of malignant neoplasm of other organs or systems: Secondary | ICD-10-CM | POA: Diagnosis not present

## 2018-06-16 LAB — CBC
HCT: 41.1 % (ref 36.0–46.0)
Hemoglobin: 13 g/dL (ref 12.0–15.0)
MCH: 28.9 pg (ref 26.0–34.0)
MCHC: 31.6 g/dL (ref 30.0–36.0)
MCV: 91.3 fL (ref 80.0–100.0)
Platelets: 268 10*3/uL (ref 150–400)
RBC: 4.5 MIL/uL (ref 3.87–5.11)
RDW: 15.3 % (ref 11.5–15.5)
WBC: 16.4 10*3/uL — ABNORMAL HIGH (ref 4.0–10.5)
nRBC: 0 % (ref 0.0–0.2)

## 2018-06-16 LAB — LIPASE, BLOOD: Lipase: 19 U/L (ref 11–51)

## 2018-06-16 LAB — BASIC METABOLIC PANEL
Anion gap: 10 (ref 5–15)
BUN: 8 mg/dL (ref 6–20)
CO2: 29 mmol/L (ref 22–32)
Calcium: 8.2 mg/dL — ABNORMAL LOW (ref 8.9–10.3)
Chloride: 105 mmol/L (ref 98–111)
Creatinine, Ser: 0.64 mg/dL (ref 0.44–1.00)
GFR calc Af Amer: >60 mL/min (ref >60)
GFR calc non Af Amer: >60 mL/min (ref >60)
Glucose, Bld: 134 mg/dL — ABNORMAL HIGH (ref 70–99)
Potassium: 3.5 mmol/L (ref 3.5–5.1)
Sodium: 144 mmol/L (ref 135–145)

## 2018-06-16 LAB — HEPATIC FUNCTION PANEL
ALT: 15 U/L (ref 0–44)
AST: 21 U/L (ref 15–41)
Albumin: 3.4 g/dL — ABNORMAL LOW (ref 3.5–5.0)
Alkaline Phosphatase: 100 U/L (ref 38–126)
Bilirubin, Direct: 0.1 mg/dL (ref 0.0–0.2)
Indirect Bilirubin: 0.3 mg/dL (ref 0.3–0.9)
Total Bilirubin: 0.4 mg/dL (ref 0.3–1.2)
Total Protein: 6.6 g/dL (ref 6.5–8.1)

## 2018-06-16 LAB — INFLUENZA PANEL BY PCR (TYPE A & B)
Influenza A By PCR: NEGATIVE
Influenza B By PCR: NEGATIVE

## 2018-06-16 LAB — TROPONIN I: Troponin I: <0.03 ng/mL (ref <0.03)

## 2018-06-16 LAB — STREP PNEUMONIAE URINARY ANTIGEN: Strep Pneumo Urinary Antigen: NEGATIVE

## 2018-06-16 LAB — MRSA PCR SCREENING: MRSA by PCR: NEGATIVE

## 2018-06-16 MED ORDER — ORAL CARE MOUTH RINSE
15.0000 mL | Freq: Two times a day (BID) | OROMUCOSAL | Status: DC
  Administered 2018-06-16 – 2018-06-18 (×5): 15 mL via OROMUCOSAL

## 2018-06-16 MED ORDER — IPRATROPIUM-ALBUTEROL 0.5-2.5 (3) MG/3ML IN SOLN
3.0000 mL | Freq: Three times a day (TID) | RESPIRATORY_TRACT | Status: DC
  Administered 2018-06-16 – 2018-06-18 (×6): 3 mL via RESPIRATORY_TRACT
  Filled 2018-06-16 (×7): qty 3

## 2018-06-16 MED ORDER — BUTALBITAL-APAP-CAFFEINE 50-325-40 MG PO TABS
1.0000 | ORAL_TABLET | Freq: Four times a day (QID) | ORAL | Status: AC | PRN
  Administered 2018-06-16 – 2018-06-17 (×3): 1 via ORAL
  Filled 2018-06-16 (×3): qty 1

## 2018-06-16 MED ORDER — GUAIFENESIN ER 600 MG PO TB12
600.0000 mg | ORAL_TABLET | Freq: Two times a day (BID) | ORAL | Status: DC
  Administered 2018-06-16 – 2018-06-18 (×4): 600 mg via ORAL
  Filled 2018-06-16 (×4): qty 1

## 2018-06-16 MED ORDER — SODIUM CHLORIDE 0.9 % IV SOLN
100.0000 mg | Freq: Two times a day (BID) | INTRAVENOUS | Status: DC
  Administered 2018-06-16 – 2018-06-17 (×2): 100 mg via INTRAVENOUS
  Filled 2018-06-16 (×2): qty 100

## 2018-06-16 MED ORDER — BENZONATATE 100 MG PO CAPS
100.0000 mg | ORAL_CAPSULE | Freq: Three times a day (TID) | ORAL | Status: DC | PRN
  Administered 2018-06-16 – 2018-06-18 (×3): 100 mg via ORAL
  Filled 2018-06-16 (×3): qty 1

## 2018-06-16 MED ORDER — PANTOPRAZOLE SODIUM 40 MG PO TBEC
40.0000 mg | DELAYED_RELEASE_TABLET | Freq: Every day | ORAL | Status: DC
  Administered 2018-06-16 – 2018-06-18 (×3): 40 mg via ORAL
  Filled 2018-06-16 (×3): qty 1

## 2018-06-16 NOTE — Progress Notes (Signed)
PROGRESS NOTE  Claudia Wiley ASN:[id number] DOB: Nov 26, 1955 DOA: 06/15/2018 PCP: System, Pcp Not In  HPI/Recap of past 24 hours:  Reports feeling better, but not baseline yet She still has congested cough, she still on supplemental o2 ( baseline not o2 dependent) No fever, denies chest pain, no edema  Assessment/Plan: Principal Problem:   COPD exacerbation (HCC) Active Problems:   Essential hypertension  Acute hypoxic respiratory failure/COPD exacerbation  -flu pcr negative, mrsa screening negative, blood culture no growth,  -cxr: 1. Previous left upper lobectomy. 2. Blunting of the left costophrenic angle could represent sequela of previous surgery versus a small effusion with atelectasis. 3. No other acute abnormalities. -continue cough, o2 dependent, lung exam very diminished with scattered wheezing -continue steroids/nebs/abx, add on mucinex -needs to wean 02  H/o lung cancer s/p lobectomy   Smoking Declined nitotinr patch  Body mass index is 18.94 kg/m.   Code Status: full  Family Communication: patient   Disposition Plan: home in 1-2 days, need to wean o2, ambulate   Consultants:  none  Procedures:  none  Antibiotics:  As above   Objective: BP 124/72 (BP Location: Right Arm)   Pulse 96   Temp 98.2 F (36.8 C) (Oral)   Resp 20   Ht 4\' 10"  (1.473 m)   Wt 41.1 kg   SpO2 97%   BMI 18.94 kg/m   Intake/Output Summary (Last 24 hours) at 06/16/2018 1008 Last data filed at 06/16/2018 0910 Gross per 24 hour  Intake 580 ml  Output -  Net 580 ml   Filed Weights   06/15/18 2320  Weight: 41.1 kg    Exam: Patient is examined daily including today on 06/16/2018, exams remain the same as of yesterday except that has changed    General:  NAD  Cardiovascular: slight sinus tachycardia  Respiratory: over all very diminished, mild bilateral wheezing  Abdomen: Soft/ND/NT, positive BS  Musculoskeletal: No Edema  Neuro: alert, oriented   Data  Reviewed: Basic Metabolic Panel: Recent Labs  Lab 06/15/18 1708 06/16/18 0559  NA 143 144  K 3.3* 3.5  CL 99 105  CO2 31 29  GLUCOSE 114* 134*  BUN 6 8  CREATININE 0.74 0.64  CALCIUM 8.9 8.2*   Liver Function Tests: Recent Labs  Lab 06/15/18 2325  AST 21  ALT 15  ALKPHOS 100  BILITOT 0.4  PROT 6.6  ALBUMIN 3.4*   Recent Labs  Lab 06/15/18 2325  LIPASE 19   No results for input(s): AMMONIA in the last 168 hours. CBC: Recent Labs  Lab 06/15/18 2325 06/16/18 0559  WBC 17.0* 16.4*  NEUTROABS 16.4*  --   HGB 12.6 13.0  HCT 41.2 41.1  MCV 93.8 91.3  PLT 280 268   Cardiac Enzymes:   Recent Labs  Lab 06/15/18 2325  TROPONINI <0.03   BNP (last 3 results) Recent Labs    06/15/18 1725  BNP 64.1    ProBNP (last 3 results) No results for input(s): PROBNP in the last 8760 hours.  CBG: No results for input(s): GLUCAP in the last 168 hours.  No results found for this or any previous visit (from the past 240 hour(s)).   Studies: Ct Head Wo Contrast  Result Date: 06/15/2018 CLINICAL DATA:  Focal neurologic deficit. Stroke suspected. Weakness and dizziness. EXAM: CT HEAD WITHOUT CONTRAST TECHNIQUE: Contiguous axial images were obtained from the base of the skull through the vertex without intravenous contrast. COMPARISON:  None. FINDINGS: Brain: No evidence of acute infarction,  hemorrhage, hydrocephalus, extra-axial collection or mass lesion/mass effect. Mild age related involutional changes of brain. Vascular: No hyperdense vessel or unexpected calcification. Skull: Normal. Negative for fracture or focal lesion. Sinuses/Orbits: No acute finding. Other: None. IMPRESSION: Mild age related involutional changes of the brain without acute intracranial abnormality identified on CT. Electronically Signed   By: Ashley Royalty M.D.   On: 06/15/2018 18:27   Dg Chest Port 1 View  Result Date: 06/15/2018 CLINICAL DATA:  Shortness of breath for 7 days. Status post left upper  lobectomy for cancer. EXAM: PORTABLE CHEST 1 VIEW COMPARISON:  October 19, 2011 FINDINGS: The patient is status post left upper lobectomy. Hyperinflation of the lungs. Blunting of the left costophrenic angle is noted. No other acute abnormalities. The cardiomediastinal silhouette is stable. The lungs are otherwise clear. IMPRESSION: 1. Previous left upper lobectomy. 2. Blunting of the left costophrenic angle could represent sequela of previous surgery versus a small effusion with atelectasis. 3. No other acute abnormalities. Electronically Signed   By: Dorise Bullion III M.D   On: 06/15/2018 18:39    Scheduled Meds: . amLODipine  2.5 mg Oral Daily  . budesonide (PULMICORT) nebulizer solution  0.25 mg Nebulization BID  . enoxaparin (LOVENOX) injection  30 mg Subcutaneous QHS  . ipratropium-albuterol  3 mL Nebulization QID  . lipase/protease/amylase  24,000 Units Oral BID WC  . mouth rinse  15 mL Mouth Rinse BID  . methylPREDNISolone (SOLU-MEDROL) injection  20 mg Intravenous Q12H  . mirtazapine  15 mg Oral QHS  . pantoprazole  40 mg Oral Daily  . pregabalin  150 mg Oral BID    Continuous Infusions: . doxycycline (VIBRAMYCIN) IV       Time spent: 29mins I have personally reviewed and interpreted on  06/16/2018 daily labs, tele strips, imagings as discussed above under date review session and assessment and plans.  I reviewed all nursing notes, pharmacy notes,  vitals, pertinent old records  I have discussed plan of care as described above with RN , patient on 06/16/2018   Florencia Reasons MD, PhD  [facility] Pager [phone]. If 7PM-7AM, please contact night-coverage at [email or web address], password [facility] 06/16/2018, 10:08 AM  LOS: 0 days

## 2018-06-16 NOTE — Plan of Care (Signed)

## 2018-06-17 LAB — HIV ANTIBODY (ROUTINE TESTING W REFLEX): HIV Screen 4th Generation wRfx: NONREACTIVE

## 2018-06-17 LAB — CBC WITH DIFFERENTIAL/PLATELET
Abs Immature Granulocytes: 0.15 10*3/uL — ABNORMAL HIGH (ref 0.00–0.07)
Basophils Absolute: 0 10*3/uL (ref 0.0–0.1)
Basophils Relative: 0 %
Eosinophils Absolute: 0 10*3/uL (ref 0.0–0.5)
Eosinophils Relative: 0 %
HCT: 41.7 % (ref 36.0–46.0)
Hemoglobin: 12.8 g/dL (ref 12.0–15.0)
Immature Granulocytes: 1 %
Lymphocytes Relative: 7 %
Lymphs Abs: 1.3 10*3/uL (ref 0.7–4.0)
MCH: 28.5 pg (ref 26.0–34.0)
MCHC: 30.7 g/dL (ref 30.0–36.0)
MCV: 92.9 fL (ref 80.0–100.0)
Monocytes Absolute: 0.6 10*3/uL (ref 0.1–1.0)
Monocytes Relative: 3 %
Neutro Abs: 17 10*3/uL — ABNORMAL HIGH (ref 1.7–7.7)
Neutrophils Relative %: 89 %
Platelets: 308 10*3/uL (ref 150–400)
RBC: 4.49 MIL/uL (ref 3.87–5.11)
RDW: 15 % (ref 11.5–15.5)
WBC: 19 10*3/uL — ABNORMAL HIGH (ref 4.0–10.5)
nRBC: 0 % (ref 0.0–0.2)

## 2018-06-17 LAB — BASIC METABOLIC PANEL
Anion gap: 7 (ref 5–15)
BUN: 6 mg/dL (ref 6–20)
CO2: 32 mmol/L (ref 22–32)
Calcium: 8.4 mg/dL — ABNORMAL LOW (ref 8.9–10.3)
Chloride: 104 mmol/L (ref 98–111)
Creatinine, Ser: 0.58 mg/dL (ref 0.44–1.00)
GFR calc Af Amer: >60 mL/min (ref >60)
GFR calc non Af Amer: >60 mL/min (ref >60)
Glucose, Bld: 134 mg/dL — ABNORMAL HIGH (ref 70–99)
Potassium: 3.4 mmol/L — ABNORMAL LOW (ref 3.5–5.1)
Sodium: 143 mmol/L (ref 135–145)

## 2018-06-17 LAB — EXPECTORATED SPUTUM ASSESSMENT W GRAM STAIN, RFLX TO RESP C

## 2018-06-17 LAB — PROCALCITONIN: Procalcitonin: <0.1 ng/mL

## 2018-06-17 MED ORDER — PREDNISONE 20 MG PO TABS
40.0000 mg | ORAL_TABLET | Freq: Every day | ORAL | Status: DC
  Administered 2018-06-17 – 2018-06-18 (×2): 40 mg via ORAL
  Filled 2018-06-17 (×3): qty 2

## 2018-06-17 MED ORDER — DOXYCYCLINE HYCLATE 100 MG PO TABS
100.0000 mg | ORAL_TABLET | Freq: Two times a day (BID) | ORAL | Status: DC
  Administered 2018-06-17 – 2018-06-18 (×2): 100 mg via ORAL
  Filled 2018-06-17 (×2): qty 1

## 2018-06-17 MED ORDER — HYDRALAZINE HCL 20 MG/ML IJ SOLN: 10.0000 mg | Freq: Once | INTRAMUSCULAR | Status: DC

## 2018-06-17 MED ORDER — POTASSIUM CHLORIDE CRYS ER 20 MEQ PO TBCR
40.0000 meq | EXTENDED_RELEASE_TABLET | Freq: Once | ORAL | Status: AC
  Administered 2018-06-17: 40 meq via ORAL
  Filled 2018-06-17: qty 2

## 2018-06-17 NOTE — Progress Notes (Signed)
SATURATION QUALIFICATIONS: (This note is used to comply with regulatory documentation for home oxygen)  Patient Saturations on Room Air at Rest = 91%  Patient Saturations on Room Air while Ambulating = 85%  Patient Saturations on 2 Liters of oxygen while Ambulating = 91%  Please briefly explain why patient needs home oxygen: Pt desaturates on room air while ambulating to 85%. Pt requires 2 L O2 via [place] to keep oxygen saturations above 88%.

## 2018-06-17 NOTE — Evaluation (Signed)
Physical Therapy Evaluation Patient Details Name: Claudia Wiley MRN: [id number] DOB: [birth year] Today's Date: 06/17/2018   History of Present Illness  Claudia Wiley is a 62 y.o. female with history of COPD, hypertension, ongoing tobacco abuse, previous history of lung cancer status post lobectomy, history of pancreatic cancer as per the patient is status post surgery presents to the ER because of shortness of breath.  Admitted for COPD exacerbation.  Clinical Impression  Patient presents with decreased mobility due to generalized weakness and limited cardiorespiratory endurance.  Currently minguard with pushing IV pole to walk in hallway.  She previously was ambulating and dressing/bathing herself. She will benefit from skilled PT in the acute setting to allow return home with family support.  Likely will not need follow up PT at d/c. SpO2 on RA during MMT dropped to 86%, with ambulation on 1LPM portable O2 SpO2 88% or greater.      Follow Up Recommendations No PT follow up    Equipment Recommendations  None recommended by PT    Recommendations for Other Services       Precautions / Restrictions Precautions Precautions: Fall Precaution Comments: watch SpO2      Mobility  Bed Mobility Overal bed mobility: Modified Independent                Transfers Overall transfer level: Needs assistance   Transfers: Sit to/from Stand Sit to Stand: Supervision         General transfer comment: assist for safety with IV, O2, SpO2 line  Ambulation/Gait Ambulation/Gait assistance: Min guard Gait Distance (Feet): 120 Feet Assistive device: IV Pole Gait Pattern/deviations: Step-through pattern;Step-to pattern;Decreased stride length     General Gait Details: pushing IV pole and maintained on SpO2 monitor with O2 @ 1 LPM.    Stairs            Wheelchair Mobility    Modified Rankin (Stroke Patients Only)       Balance Overall balance assessment: Mild deficits observed,  not formally tested                                           Pertinent Vitals/Pain Pain Assessment: No/denies pain    Home Living Family/patient expects to be discharged to:: Private residence Living Arrangements: Children Available Help at Discharge: Family Type of Home: Apartment Home Access: Stairs to enter Entrance Stairs-Rails: Right Entrance Stairs-Number of Steps: 3 Home Layout: One level Home Equipment: Environmental consultant - 2 wheels;Shower seat;Wheelchair - manual Additional Comments: likes to take a tub bath    Prior Function Level of Independence: Needs assistance   Gait / Transfers Assistance Needed: walks independent  ADL's / Homemaking Assistance Needed: can't do cleaning due to chemicals   Comments: daughter assists into/out of tub     Hand Dominance        Extremity/Trunk Assessment   Upper Extremity Assessment Upper Extremity Assessment: Generalized weakness    Lower Extremity Assessment Lower Extremity Assessment: Generalized weakness       Communication      Cognition Arousal/Alertness: Awake/alert Behavior During Therapy: WFL for tasks assessed/performed Overall Cognitive Status: Within Functional Limits for tasks assessed                                        General Comments General  comments (skin integrity, edema, etc.): SpO2 on RA during strength assessment dropped to 86% so ambulated on 1L portable O2 with SpO2 88% at lowest.     Exercises     Assessment/Plan    PT Assessment Patient needs continued PT services  PT Problem List Decreased strength;Decreased balance;Decreased knowledge of use of DME;Decreased activity tolerance;Cardiopulmonary status limiting activity;Decreased mobility;Decreased safety awareness       PT Treatment Interventions DME instruction;Gait training;Therapeutic exercise;Patient/family education;Therapeutic activities;Functional mobility training;Stair training    PT Goals (Current  goals can be found in the Care Plan section)  Acute Rehab PT Goals Patient Stated Goal: to go home today PT Goal Formulation: With patient Time For Goal Achievement: 06/24/18 Potential to Achieve Goals: Good    Frequency Min 3X/week   Barriers to discharge        Co-evaluation               AM-PAC PT "6 Clicks" Mobility  Outcome Measure Help needed turning from your back to your side while in a flat bed without using bedrails?: A Little Help needed moving from lying on your back to sitting on the side of a flat bed without using bedrails?: A Little Help needed moving to and from a bed to a chair (including a wheelchair)?: A Little Help needed standing up from a chair using your arms (e.g., wheelchair or bedside chair)?: A Little Help needed to walk in hospital room?: A Little Help needed climbing 3-5 steps with a railing? : A Little 6 Click Score: 18    End of Session Equipment Utilized During Treatment: Gait belt;Oxygen Activity Tolerance: Patient tolerated treatment well Patient left: with call bell/phone within reach;in chair   PT Visit Diagnosis: Other abnormalities of gait and mobility (R26.89);Muscle weakness (generalized) (M62.81)    Time: 4287-6811 PT Time Calculation (min) (ACUTE ONLY): 17 min   Charges:   PT Evaluation $PT Eval Low Complexity: [place], [place] Acute Rehabilitation Services [phone] 06/17/2018   Reginia Naas 06/17/2018, 11:18 AM

## 2018-06-17 NOTE — Progress Notes (Signed)
PHARMACIST - PHYSICIAN COMMUNICATION CONCERNING: Antibiotic IV to Oral Route Change Policy  RECOMMENDATION: This patient is receiving doxycycline by the intravenous route.  Based on criteria approved by the Pharmacy and Therapeutics Committee, the antibiotic(s) is/are being converted to the equivalent oral dose form(s).   DESCRIPTION: These criteria include:  Patient being treated for a respiratory tract infection, urinary tract infection, cellulitis or clostridium difficile associated diarrhea if on metronidazole  The patient is not neutropenic and does not exhibit a GI malabsorption state  The patient is eating (either orally or via tube) and/or has been taking other orally administered medications for a least 24 hours  The patient is improving clinically and has a Tmax < 100.5  If you have questions about this conversion, please contact the Pharmacy Department  []   [phone] )  [facility] []   [phone] )  [facility] []   [phone] )  [facility] []   [phone] )  [facility] [x]   [phone] )  [place], [place].D [phone] 06/17/2018 10:35 AM

## 2018-06-17 NOTE — Progress Notes (Signed)
PROGRESS NOTE    Claudia Wiley  HDQ:[id number] DOB: 1955-07-30 DOA: 06/15/2018 PCP: System, Pcp Not In   Brief Narrative: 62 year old with past medical history significant of COPD ongoing tobacco abuse, history of lung cancer status post lobectomy, history of pancreatic cancer status post surgery, who presents complaining of shortness of breath.  Patient admitted with acute hypoxic respiratory failure secondary to COPD exacerbation.  Patient presented with hypoxemia.    Assessment & Plan:   Principal Problem:   COPD exacerbation ([place]) Active Problems:   Essential hypertension   1-acute hypoxic respiratory failure secondary to COPD exacerbation. Still hypoxic requiring 2 L of oxygen.  Might need to be discharged on home oxygen.  We will try to arrange today. Breathing has improved but not at baseline. We will plan to transition from IV Solu-Medrol to prednisone and monitor overnight. Continue with nebulizer treatment. Continue with doxycycline Hypertension continue with Norvasc. Tobacco abuse: Counseling. History of lung cancer status post lobectomy:; Needs to follow-up with her primary oncology  History of pancreatic cancer status post resection.  Hypokalemia replete orally Leukocytosis suspect related to steroids. Repeat in a.m.  RN Pressure Injury Documentation:    Malnutrition Type:      Malnutrition Characteristics:      Nutrition Interventions:     Estimated body mass index is 18.94 kg/m as calculated from the following:   Height as of this encounter: 4\' 10"  (1.473 m).   Weight as of this encounter: 41.1 kg.   DVT prophylaxis: Lovenox Code Status: Full code Family Communication: No family at bedside Disposition Plan: Home in 24 hours if respiratory status  stable Antimicrobials: Doxycycline    Subjective: Patient reports improvement of shortness of breath, not at baseline.  Reports cough.  Objective: Vitals:   06/17/18 0754 06/17/18 0933  06/17/18 1224 06/17/18 1435  BP:  132/79 131/83   Pulse:   91   Resp:      Temp:   98.5 F (36.9 C)   TempSrc:   Oral   SpO2: 98%  95% 95%  Weight:      Height:        Intake/Output Summary (Last 24 hours) at 06/17/2018 1718 Last data filed at 06/17/2018 1524 Gross per 24 hour  Intake 846.12 ml  Output 1650 ml  Net -803.88 ml   Filed Weights   06/15/18 2320  Weight: 41.1 kg    Examination:  General exam: Appears calm and comfortable  Respiratory system: Bilateral rhonchorous. Cardiovascular system: S1 & S2 heard, RRR. No JVD, murmurs, rubs, gallops or clicks. No pedal edema. Gastrointestinal system: Abdomen is nondistended, soft and nontender. No organomegaly or masses felt. Normal bowel sounds heard. Central nervous system: Alert and oriented. No focal neurological deficits. Extremities: Symmetric 5 x 5 power. Skin: No rashes, lesions or ulcers Psychiatry: Judgement and insight appear normal. Mood & affect appropriate.     Data Reviewed: I have personally reviewed following labs and imaging studies  CBC: Recent Labs  Lab 06/15/18 2325 06/16/18 0559 06/17/18 0539  WBC 17.0* 16.4* 19.0*  NEUTROABS 16.4*  --  17.0*  HGB 12.6 13.0 12.8  HCT 41.2 41.1 41.7  MCV 93.8 91.3 92.9  PLT 280 268 119   Basic Metabolic Panel: Recent Labs  Lab 06/15/18 1708 06/16/18 0559 06/17/18 0539  NA 143 144 143  K 3.3* 3.5 3.4*  CL 99 105 104  CO2 31 29 32  GLUCOSE 114* 134* 134*  BUN 6 8 6   CREATININE 0.74 0.64 0.58  CALCIUM 8.9 8.2* 8.4*   GFR: Estimated Creatinine Clearance: 48.3 mL/min (by C-G formula based on SCr of 0.58 mg/dL). Liver Function Tests: Recent Labs  Lab 06/15/18 2325  AST 21  ALT 15  ALKPHOS 100  BILITOT 0.4  PROT 6.6  ALBUMIN 3.4*   Recent Labs  Lab 06/15/18 2325  LIPASE 19   No results for input(s): AMMONIA in the last 168 hours. Coagulation Profile: No results for input(s): INR, PROTIME in the last 168 hours. Cardiac Enzymes: Recent  Labs  Lab 06/15/18 2325  TROPONINI <0.03   BNP (last 3 results) No results for input(s): PROBNP in the last 8760 hours. HbA1C: No results for input(s): HGBA1C in the last 72 hours. CBG: No results for input(s): GLUCAP in the last 168 hours. Lipid Profile: No results for input(s): CHOL, HDL, LDLCALC, TRIG, CHOLHDL, LDLDIRECT in the last 72 hours. Thyroid Function Tests: No results for input(s): TSH, T4TOTAL, FREET4, T3FREE, THYROIDAB in the last 72 hours. Anemia Panel: No results for input(s): VITAMINB12, FOLATE, FERRITIN, TIBC, IRON, RETICCTPCT in the last 72 hours. Sepsis Labs: Recent Labs  Lab 06/15/18 1759 06/17/18 0539  PROCALCITON  --  <0.10  LATICACIDVEN 1.48  --     Recent Results (from the past 240 hour(s))  Culture, blood (Routine X 2) w Reflex to ID Panel     Status: None (Preliminary result)   Collection Time: 06/15/18  5:28 PM  Result Value Ref Range Status   Specimen Description   Final    BLOOD LEFT ANTECUBITAL Performed at [facility] [address] [place], [place] [address]    Special Requests   Final    BOTTLES DRAWN AEROBIC AND ANAEROBIC Blood Culture adequate volume Performed at [facility] [address], [place], [place] [address]    Culture   Final    NO GROWTH 2 DAYS Performed at [facility] [address], [place], [place] [address]    Report Status PENDING  Incomplete  Culture, blood (Routine X 2) w Reflex to ID Panel     Status: None (Preliminary result)   Collection Time: 06/15/18  5:33 PM  Result Value Ref Range Status   Specimen Description   Final    BLOOD LEFT ARM Performed at [facility] [address], [place], [place] [address]    Special Requests   Final    BOTTLES DRAWN AEROBIC ONLY Blood Culture adequate volume Performed at [facility] [address] [place], [place] [address]    Culture   Final    NO GROWTH 2 DAYS Performed at  [facility] [address], [place], [place] [address]    Report Status PENDING  Incomplete  MRSA PCR Screening     Status: None   Collection Time: 06/16/18 11:05 AM  Result Value Ref Range Status   MRSA by PCR NEGATIVE NEGATIVE Final    Comment:        The GeneXpert MRSA Assay (FDA approved for NASAL specimens only), is one component of a comprehensive MRSA colonization surveillance program. It is not intended to diagnose MRSA infection nor to guide or monitor treatment for MRSA infections. Performed at [facility], [place] [address] [place], [place] [address]   Expectorated sputum assessment w rflx to resp cult     Status: None   Collection Time: 06/17/18  9:47 AM  Result Value Ref Range Status   Specimen Description SPUTUM  Final  Special Requests NONE  Final   Sputum evaluation   Final    THIS SPECIMEN IS ACCEPTABLE FOR SPUTUM CULTURE Performed at [facility] [address], [place], [place] [address]    Report Status 06/17/2018 FINAL  Final  Culture, respiratory     Status: None (Preliminary result)   Collection Time: 06/17/18  9:47 AM  Result Value Ref Range Status   Specimen Description   Final    SPUTUM Performed at [facility] [address] [place], [place] [address]    Special Requests   Final    NONE Reflexed from [id number] Performed at [facility], [place] [address] [place], [place] [address]    Gram Stain   Final    NO WBC SEEN FEW GRAM POSITIVE COCCI IN PAIRS FEW GRAM POSITIVE RODS FEW BUDDING YEAST SEEN Performed at [facility], [place] [address] [place],  [address]    Culture PENDING  Incomplete   Report Status PENDING  Incomplete         Radiology Studies: Ct Head Wo Contrast  Result Date: 06/15/2018 CLINICAL DATA:  Focal neurologic deficit. Stroke suspected. Weakness and dizziness. EXAM: CT HEAD WITHOUT CONTRAST TECHNIQUE: Contiguous axial  images were obtained from the base of the skull through the vertex without intravenous contrast. COMPARISON:  None. FINDINGS: Brain: No evidence of acute infarction, hemorrhage, hydrocephalus, extra-axial collection or mass lesion/mass effect. Mild age related involutional changes of brain. Vascular: No hyperdense vessel or unexpected calcification. Skull: Normal. Negative for fracture or focal lesion. Sinuses/Orbits: No acute finding. Other: None. IMPRESSION: Mild age related involutional changes of the brain without acute intracranial abnormality identified on CT. Electronically Signed   By: Ashley Royalty M.D.   On: 06/15/2018 18:27   Dg Chest Port 1 View  Result Date: 06/15/2018 CLINICAL DATA:  Shortness of breath for 7 days. Status post left upper lobectomy for cancer. EXAM: PORTABLE CHEST 1 VIEW COMPARISON:  October 19, 2011 FINDINGS: The patient is status post left upper lobectomy. Hyperinflation of the lungs. Blunting of the left costophrenic angle is noted. No other acute abnormalities. The cardiomediastinal silhouette is stable. The lungs are otherwise clear. IMPRESSION: 1. Previous left upper lobectomy. 2. Blunting of the left costophrenic angle could represent sequela of previous surgery versus a small effusion with atelectasis. 3. No other acute abnormalities. Electronically Signed   By: Dorise Bullion III M.D   On: 06/15/2018 18:39        Scheduled Meds: . amLODipine  2.5 mg Oral Daily  . budesonide (PULMICORT) nebulizer solution  0.25 mg Nebulization BID  . doxycycline  100 mg Oral Q12H  . enoxaparin (LOVENOX) injection  30 mg Subcutaneous QHS  . guaiFENesin  600 mg Oral BID  . ipratropium-albuterol  3 mL Nebulization TID  . lipase/protease/amylase  24,000 Units Oral BID WC  . mouth rinse  15 mL Mouth Rinse BID  . mirtazapine  15 mg Oral QHS  . pantoprazole  40 mg Oral Daily  . predniSONE  40 mg Oral Q breakfast  . pregabalin  150 mg Oral BID   Continuous Infusions:   LOS: 1 day      Time spent: 35 minutes    Elmarie Shiley, MD [facility] Pager [phone]  If 7PM-7AM, please contact night-coverage [email or web address] Password [email or web address] 06/17/2018, 5:18 PM

## 2018-06-18 LAB — CBC
HCT: 40.8 % (ref 36.0–46.0)
Hemoglobin: 12.4 g/dL (ref 12.0–15.0)
MCH: 28.6 pg (ref 26.0–34.0)
MCHC: 30.4 g/dL (ref 30.0–36.0)
MCV: 94.2 fL (ref 80.0–100.0)
Platelets: 290 10*3/uL (ref 150–400)
RBC: 4.33 MIL/uL (ref 3.87–5.11)
RDW: 14.9 % (ref 11.5–15.5)
WBC: 13.9 10*3/uL — ABNORMAL HIGH (ref 4.0–10.5)
nRBC: 0 % (ref 0.0–0.2)

## 2018-06-18 LAB — PROCALCITONIN: Procalcitonin: <0.1 ng/mL

## 2018-06-18 MED ORDER — FLUCONAZOLE 100 MG PO TABS: 100.0000 mg | ORAL_TABLET | Freq: Every day | ORAL | 0 refills | Status: DC

## 2018-06-18 MED ORDER — ALBUTEROL SULFATE HFA 108 (90 BASE) MCG/ACT IN AERS: 2.0000 | INHALATION_SPRAY | Freq: Four times a day (QID) | RESPIRATORY_TRACT | 2 refills | Status: DC | PRN

## 2018-06-18 MED ORDER — POTASSIUM CHLORIDE CRYS ER 20 MEQ PO TBCR
20.0000 meq | EXTENDED_RELEASE_TABLET | Freq: Once | ORAL | Status: AC
  Administered 2018-06-18: 20 meq via ORAL
  Filled 2018-06-18: qty 1

## 2018-06-18 MED ORDER — GUAIFENESIN ER 600 MG PO TB12: 600.0000 mg | ORAL_TABLET | Freq: Two times a day (BID) | ORAL | 0 refills | Status: DC

## 2018-06-18 MED ORDER — DOXYCYCLINE HYCLATE 100 MG PO TABS: 100.0000 mg | ORAL_TABLET | Freq: Two times a day (BID) | ORAL | 0 refills | Status: AC

## 2018-06-18 MED ORDER — FLUCONAZOLE 100 MG PO TABS
100.0000 mg | ORAL_TABLET | Freq: Every day | ORAL | Status: DC
  Administered 2018-06-18: 100 mg via ORAL
  Filled 2018-06-18: qty 1

## 2018-06-18 MED ORDER — BUDESONIDE-FORMOTEROL FUMARATE 160-4.5 MCG/ACT IN AERO: 1.0000 | INHALATION_SPRAY | Freq: Every day | RESPIRATORY_TRACT | 12 refills | Status: DC

## 2018-06-18 MED ORDER — PREDNISONE 20 MG PO TABS: 40.0000 mg | ORAL_TABLET | Freq: Every day | ORAL | 0 refills | Status: AC

## 2018-06-18 NOTE — Progress Notes (Signed)
Oxygen delivered to home and daughter brought tank for transport home.  AVS reviewed with patient and patient's daughter.  Verbalized understanding of discharge instructions, physician follow-up, medications, oxygen.  IV removed.  Site WNL.  Reports belongings intact and time of discharge.  Prescriptions faxed to [facility] per daughter/patient's request.  Prescriptions given to patient.  Patient stable at time of discharge.

## 2018-06-18 NOTE — Evaluation (Signed)
Clinical/Bedside Swallow Evaluation Patient Details  Name: Claudia Wiley MRN: [id number] Date of Birth: 22-Oct-1955  Today's Date: 06/18/2018 Time: SLP Start Time (ACUTE ONLY): 0715 SLP Stop Time (ACUTE ONLY): 0755 SLP Time Calculation (min) (ACUTE ONLY): 40 min  Past Medical History:  Past Medical History:  Diagnosis Date  . Anemia   . Arthritis   . Cancer [facility]) 2013   Upper left lung and stomach cancer  . COPD (chronic obstructive pulmonary disease) ([place])   . Esophageal stricture   . Family history of brain cancer   . Family history of melanoma   . Gastrinoma   . Gastrinoma   . GERD (gastroesophageal reflux disease)   . Hypertension   . Osteoporosis   . Pancreatic insufficiency   . Pneumonia   . Vitamin D deficiency   . Weight loss, unintentional    Past Surgical History:  Past Surgical History:  Procedure Laterality Date  . ABDOMINAL HYSTERECTOMY    . BREAST LUMPECTOMY    . CESAREAN SECTION    . LUNG SURGERY     HPI:  pt is a 62 yo female adm to [facility] with COPD exacerbation.  PMH + for esophagitis, GERD, esophageal strictures, continued tobacco use.  Pt reports she has had her esophagus stretched x3 times - last being approx one month ago but denies improvement with dilatation and admits her voice is weaker since this procedure.   SLP suspects component of esophageal dysmotility given esophagitis hx.     Assessment / Plan / Recommendation Clinical Impression  Patient's symptoms consistent with primary esophageal dysphagia but can not rule out pharyngeal component.  Poor dentition also component of aspiration risk.  Pt reports bringing up pills after they have lodged in her throat requiring ED visit.  Pt with post-swallow cough immediately after intake of water - SLP can not rule out laryngeal penetration and possible aspiration.  Chin tuck posture did not prevent symptoms.  SLP questions component of pharyngeal=cervical esophageal dysphagia.  Will plan MBS 06/19/2018  to assess pharyngeal = cervical esophageal deficits.  Pt reports she has been informed that she can not have her esophagus dilated again.  If pt is to dc today, she can come back as an OP for an MBS.   SLP pt agreeable to this plan.   SLP Visit Diagnosis: Dysphagia, unspecified (R13.10)    Aspiration Risk  Mild aspiration risk;Risk for inadequate nutrition/hydration    Diet Recommendation Regular;Thin liquid   Liquid Administration via: Cup;Straw Medication Administration: Whole meds with puree Supervision: Staff to assist with self feeding;Patient able to self feed    Other  Recommendations Oral Care Recommendations: Oral care QID   Follow up Recommendations        Frequency and Duration min 1 x/week  1 week       Prognosis Prognosis for Safe Diet Advancement: Fair Barriers to Reach Goals: Time post onset      Swallow Study   General Date of Onset: 06/18/18 HPI: pt is a 62 yo female adm to [facility] with COPD exacerbation.  PMH + for esophagitis, GERD, esophageal strictures, continued tobacco use.  Pt reports she has had her esophagus stretched x3 times - last being approx one month ago but denies improvement with dilatation and admits her voice is weaker since this procedure.   SLP suspects component of esophageal dysmotility given esophagitis hx.   Previous Swallow Assessment: prior esophagram 2013, no stricture, endoscopy 01/2018 strictures at lower 1/3, GE junction stricture  and reflux esophagitis *did not locate most recent endoscopy Diet Prior to this Study: Regular;Thin liquids Temperature Spikes Noted: No Respiratory Status: Nasal cannula History of Recent Intubation: No Behavior/Cognition: Alert;Cooperative;Pleasant mood Oral Cavity Assessment: Dry Oral Care Completed by SLP: No Oral Cavity - Dentition: Missing dentition(has dentures but does not use them due to ill fitting) Vision: Functional for self-feeding Self-Feeding Abilities: Able to feed self Patient Positioning:  Upright in bed Baseline Vocal Quality: Hoarse;Low vocal intensity Volitional Cough: Strong Volitional Swallow: Able to elicit    Oral/Motor/Sensory Function Overall Oral Motor/Sensory Function: Within functional limits   Ice Chips Ice chips: Not tested   Thin Liquid Thin Liquid: Impaired Presentation: Self Fed;Straw Pharyngeal  Phase Impairments: Cough - Immediate;Cough - Delayed Other Comments: chin tuck posture did not improve pt's symptoms causing SlP to question if pt is spilling residuals from pyriform sinus    Nectar Thick Nectar Thick Liquid: Impaired Presentation: Self Fed;Straw Other Comments: c/o sensation of slow clearance in throat and esophagus   Honey Thick Honey Thick Liquid: Not tested   Puree Puree: Not tested   Solid     Solid: Impaired Presentation: Self Fed Other Comments: pt consumes po very slowly and "gums" it excessively to decrease asp risk      Macario Golds 06/18/2018,8:39 AM  Luanna Salk, [place] Pager [phone] Office [phone]

## 2018-06-18 NOTE — Progress Notes (Signed)
Faxed info to [facility] attn Mrs. Annitta Jersey NP-fax#[phone]/PJASN [phone] [id number] of confirmation of fax.Will await [facility]. Also spoke to patient's dtr ASTMHDQ [phone] of current status for home 02. Patient stated she stopped smoking about 3 weeks ago,she had home 02 in the past,but she didn't need it anymore so [facility].She has not seen Mrs. Annitta Jersey NP but has an appt next week. Staff nurse aware to not d/c until home 02 has been delivered to hospital.

## 2018-06-18 NOTE — Progress Notes (Signed)
Note pt to dc home today.  Will plan OP MBS to allow instrumental evaluation to determine if compensation strategies may be helpful for this pt to manage her dysphagia.  Pt admits that she has more difficulties with swallowing each time she becomes ill.  She also reports she can not have her esophagus dilated again per her GI MD.    However pt is very mindful of her dysphagia and eats what she knows she can tolerate as she is cognitively intact.  Educated pt this am to hints for eating/drinking with chronic pulmonary issues.    Spoke to MD regarding plan.  Will plan to see pt as an OP.  Thanks.  Luanna Salk, [facility] SLP Acute Rehab Services Pager [phone] Office [phone]

## 2018-06-18 NOTE — Progress Notes (Signed)
Received call from [place] for [place] for home 02-[place]-[phone] has all info & is working on delivery home 02 to patient's home(confirmed address)-process takes up to 4 hours.TC dtr [name] [phone] is aware that she will receive a call from [facility] for home 02 delivery to patient's home-then [name] can bring the home 02 travel tank to the hospital for safe d/c home-patient/dtr voiced understanding. Nurse updated.

## 2018-06-18 NOTE — Progress Notes (Signed)
TC [facility] to f/u on home 02 status since CM did not get a call back-TC call center Glenda 1 [phone]-she will contact Mariann Laster to call CM-CM informed Holley Raring this is urgent for patient to d/c home with home 02 prior to d/c.

## 2018-06-18 NOTE — Discharge Summary (Addendum)
Physician Discharge Summary  Claudia Wiley DVV:[id number] DOB: September 05, 1955 DOA: 06/15/2018  PCP: System, Pcp Not In  Admit date: 06/15/2018 Discharge date: 06/18/2018  Admitted From: Home  Disposition:  Home   Recommendations for Outpatient Follow-up:  1. Follow up with PCP in 1-2 weeks 2. Please obtain BMP/CBC in one week 3. Needs speech therapy follow up.  4. Needs to follow up with primary gastroenterologist   Home Health: none Equipment/Devices: Oxygen 2 L.   Discharge Condition: Stable.  CODE STATUS: full code.  Diet recommendation: Heart Healthy   Brief/Interim Summary: 62 year old with past medical history significant of COPD ongoing tobacco abuse, history of lung cancer status post lobectomy, history of pancreatic cancer status post surgery, who presents complaining of shortness of breath.  Patient admitted with acute hypoxic respiratory failure secondary to COPD exacerbation.  Patient presented with hypoxemia.   1-Acute hypoxic respiratory failure secondary to COPD exacerbation. Patient will require Oxygen at discharge.  Breathing has improved but not at baseline. Tolerated oral prednisone.  Continue with nebulizer treatment. Continue with doxycycline  Hypertension continue with Norvasc.  Tobacco abuse: Counseling.  History of lung cancer status post lobectomy:; Needs to follow-up with her primary oncology  History of pancreatic cancer status post resection.  Hypokalemia replete orally Dysphagia; chronic. Gets some difficulty with swallowing some times.   Leukocytosis suspect related to steroids. Trending down. No evidence of infection.    Oral candidiasis. Discharge on oral fluconazole.   Discharge Diagnoses:  Principal Problem:   COPD exacerbation ([place]) Active Problems:   Essential hypertension    Discharge Instructions  Discharge Instructions    Diet - low sodium heart healthy   Complete by:  As directed    Increase activity slowly    Complete by:  As directed      Allergies as of 06/18/2018      Reactions   Azithromycin    Gabapentin       Medication List    STOP taking these medications   esomeprazole 40 MG capsule Commonly known as:  NEXIUM   promethazine 25 MG tablet Commonly known as:  PHENERGAN     TAKE these medications   albuterol 108 (90 Base) MCG/ACT inhaler Commonly known as:  PROVENTIL HFA;VENTOLIN HFA Inhale 2 puffs into the lungs every 6 (six) hours as needed for wheezing or shortness of breath. What changed:  You were already taking a medication with the same name, and this prescription was added. Make sure you understand how and when to take each.   albuterol 108 (90 Base) MCG/ACT inhaler Commonly known as:  PROVENTIL HFA;VENTOLIN HFA Inhale into the lungs every 6 (six) hours as needed for wheezing or shortness of breath. What changed:  Another medication with the same name was added. Make sure you understand how and when to take each.   amitriptyline 10 MG tablet Commonly known as:  ELAVIL Take 10 mg by mouth at bedtime.   amLODipine 2.5 MG tablet Commonly known as:  NORVASC Take 2.5 mg by mouth daily.   budesonide-formoterol 160-4.5 MCG/ACT inhaler Commonly known as:  SYMBICORT Inhale 1 puff into the lungs daily.   cholecalciferol 10 MCG (400 UNIT) Tabs tablet Commonly known as:  VITAMIN D3 Take 800 Units by mouth. What changed:  Another medication with the same name was removed. Continue taking this medication, and follow the directions you see here.   doxycycline 100 MG tablet Commonly known as:  VIBRA-TABS Take 1 tablet (100 mg total) by mouth every 12 (twelve)  hours for 5 days.   ENSURE Take 237 mLs by mouth 3 (three) times daily between meals.   fluconazole 100 MG tablet Commonly known as:  DIFLUCAN Take 1 tablet (100 mg total) by mouth daily.   guaiFENesin 600 MG 12 hr tablet Commonly known as:  MUCINEX Take 1 tablet (600 mg total) by mouth 2 (two) times daily.    lipase/protease/amylase 12000 units Cpep capsule Commonly known as:  CREON Take 24,000 Units by mouth 2 (two) times daily.   mirtazapine 15 MG tablet Commonly known as:  REMERON Take 15 mg by mouth at bedtime.   multivitamin tablet Take 1 tablet by mouth daily.   pantoprazole 40 MG tablet Commonly known as:  PROTONIX Take 40 mg by mouth daily.   predniSONE 20 MG tablet Commonly known as:  DELTASONE Take 2 tablets (40 mg total) by mouth daily with breakfast for 5 days. Start taking on:  06/19/2018   pregabalin 150 MG capsule Commonly known as:  LYRICA Take 150 mg by mouth 2 (two) times daily.   SUMAtriptan 50 MG tablet Commonly known as:  IMITREX Take 1 tablet by mouth every 2 (two) hours as needed for migraine. May repeat in 2 hours if headache persists or recurs. No more than 200mg  per day   tiotropium 18 MCG inhalation capsule Commonly known as:  SPIRIVA Place 18 mcg into inhaler and inhale daily.   tiZANidine 4 MG capsule Commonly known as:  ZANAFLEX Take 2 mg by mouth at bedtime.   VITAMIN B-12 IJ Inject as directed every 28 (twenty-eight) days.            Durable Medical Equipment  (From admission, onward)         Start     Ordered   06/17/18 1136  For home use only DME oxygen  Once    Question Answer Comment  Mode or (Route) Nasal cannula   Liters per Minute 2   Frequency Continuous (stationary and portable oxygen unit needed)   Oxygen delivery system Gas      06/17/18 1135         Follow-up Information    [facility] Schedule an appointment as soon as possible for a visit.   Why:  call to schedule hospital follow-up in 1-2 weeks Contact information: [place] [address] [phone]        [facility]   Specialty:  General Practice Why:  [facility] [phone] Contact information: [address] [place] [phone]           Allergies  Allergen Reactions  . Azithromycin   . Gabapentin     Consultations:  none   Procedures/Studies: Ct Head Wo Contrast  Result Date: 06/15/2018 CLINICAL DATA:  Focal neurologic deficit. Stroke suspected. Weakness and dizziness. EXAM: CT HEAD WITHOUT CONTRAST TECHNIQUE: Contiguous axial images were obtained from the base of the skull through the vertex without intravenous contrast. COMPARISON:  None. FINDINGS: Brain: No evidence of acute infarction, hemorrhage, hydrocephalus, extra-axial collection or mass lesion/mass effect. Mild age related involutional changes of brain. Vascular: No hyperdense vessel or unexpected calcification. Skull: Normal. Negative for fracture or focal lesion. Sinuses/Orbits: No acute finding. Other: None. IMPRESSION: Mild age related involutional changes of the brain without acute intracranial abnormality identified on CT. Electronically Signed   By: Ashley Royalty M.D.   On: 06/15/2018 18:27   Dg Chest Port 1 View  Result Date: 06/15/2018 CLINICAL DATA:  Shortness of breath for 7 days. Status post left upper lobectomy for cancer. EXAM: PORTABLE CHEST 1 VIEW COMPARISON:  October 19, 2011 FINDINGS: The patient is status post left upper lobectomy. Hyperinflation of the lungs. Blunting of the left costophrenic angle is noted. No other acute abnormalities. The cardiomediastinal silhouette is stable. The lungs are otherwise clear. IMPRESSION: 1. Previous left upper lobectomy. 2. Blunting of the left costophrenic angle could represent sequela of previous surgery versus a small effusion with atelectasis. 3. No other acute abnormalities. Electronically Signed   By: Dorise Bullion III M.D   On: 06/15/2018 18:39      Subjective: Feeling better, breathing better.  Get sometimes trouble with swallowing.   Discharge Exam: Vitals:   06/18/18 1000 06/18/18 1224  BP: 130/86 140/87  Pulse: 97 (!) 106  Resp: 20   Temp: 98.7 F (37.1 C)   SpO2: 95% 95%   Vitals:    06/18/18 0348 06/18/18 0937 06/18/18 1000 06/18/18 1224  BP: (!) 158/99  130/86 140/87  Pulse: 84  97 (!) 106  Resp: 20  20   Temp: 98.3 F (36.8 C)  98.7 F (37.1 C)   TempSrc: Oral  Oral   SpO2: 95% 95% 95% 95%  Weight:      Height:        General: Pt is alert, awake, not in acute distress Cardiovascular: RRR, S1/S2 +, no rubs, no gallops Respiratory: less wheezing.  Abdominal: Soft, NT, ND, bowel sounds + Extremities: no edema, no cyanosis    The results of significant diagnostics from this hospitalization (including imaging, microbiology, ancillary and laboratory) are listed below for reference.     Microbiology: Recent Results (from the past 240 hour(s))  Culture, blood (Routine X 2) w Reflex to ID Panel     Status: None (Preliminary result)   Collection Time: 06/15/18  5:28 PM  Result Value Ref Range Status   Specimen Description   Final    BLOOD LEFT ANTECUBITAL Performed at [facility] [address], [place], [place] [address]    Special Requests   Final    BOTTLES DRAWN AEROBIC AND ANAEROBIC Blood Culture adequate volume Performed at [facility] [address] [place], [place] [address]    Culture   Final    NO GROWTH 3 DAYS Performed at [facility], [place] [address] [place], [place] [address]    Report Status PENDING  Incomplete  Culture, blood (Routine X 2) w Reflex to ID Panel     Status: None (Preliminary result)   Collection Time: 06/15/18  5:33 PM  Result Value Ref Range Status   Specimen Description   Final    BLOOD LEFT ARM Performed at [facility] [address], [place], [place] [address]    Special Requests   Final    BOTTLES DRAWN AEROBIC ONLY Blood Culture adequate volume Performed at [facility] [address], [place], [place] [address]    Culture   Final    NO GROWTH 3 DAYS Performed at [facility], [place] [address] [place], [place]  [address]    Report Status PENDING  Incomplete  MRSA PCR Screening     Status: None   Collection Time: 06/16/18 11:05 AM  Result Value Ref Range Status   MRSA by PCR NEGATIVE NEGATIVE Final    Comment:        The GeneXpert MRSA Assay (FDA approved for NASAL specimens only),  is one component of a comprehensive MRSA colonization surveillance program. It is not intended to diagnose MRSA infection nor to guide or monitor treatment for MRSA infections. Performed at [facility], [place] [address], [place], [place] [address]   Expectorated sputum assessment w rflx to resp cult     Status: None   Collection Time: 06/17/18  9:47 AM  Result Value Ref Range Status   Specimen Description SPUTUM  Final   Special Requests NONE  Final   Sputum evaluation   Final    THIS SPECIMEN IS ACCEPTABLE FOR SPUTUM CULTURE Performed at [facility], [place] [address], [place], [place] [address]    Report Status 06/17/2018 FINAL  Final  Culture, respiratory     Status: None (Preliminary result)   Collection Time: 06/17/18  9:47 AM  Result Value Ref Range Status   Specimen Description   Final    SPUTUM Performed at [facility] [address], [place], [place] [address]    Special Requests   Final    NONE Reflexed from [phone] Performed at [facility], [place] [address] [place], [place] [address]    Gram Stain   Final    NO WBC SEEN FEW GRAM POSITIVE COCCI IN PAIRS FEW GRAM POSITIVE RODS FEW BUDDING YEAST SEEN    Culture   Final    CULTURE REINCUBATED FOR BETTER GROWTH Performed at [facility], [place] [address], [place], [place] [address]    Report Status PENDING  Incomplete     Labs: BNP (last 3 results) Recent Labs    06/15/18 1725  BNP 29.4   Basic Metabolic Panel: Recent Labs  Lab 06/15/18 1708 06/16/18 0559 06/17/18 0539  NA 143 144 143  K 3.3* 3.5 3.4*  CL 99 105 104  CO2 31 29 32  GLUCOSE 114* 134*  134*  BUN 6 8 6   CREATININE 0.74 0.64 0.58  CALCIUM 8.9 8.2* 8.4*   Liver Function Tests: Recent Labs  Lab 06/15/18 2325  AST 21  ALT 15  ALKPHOS 100  BILITOT 0.4  PROT 6.6  ALBUMIN 3.4*   Recent Labs  Lab 06/15/18 2325  LIPASE 19   No results for input(s): AMMONIA in the last 168 hours. CBC: Recent Labs  Lab 06/15/18 2325 06/16/18 0559 06/17/18 0539 06/18/18 0526  WBC 17.0* 16.4* 19.0* 13.9*  NEUTROABS 16.4*  --  17.0*  --   HGB 12.6 13.0 12.8 12.4  HCT 41.2 41.1 41.7 40.8  MCV 93.8 91.3 92.9 94.2  PLT 280 268 308 290   Cardiac Enzymes: Recent Labs  Lab 06/15/18 2325  TROPONINI <0.03   BNP: Invalid input(s): POCBNP CBG: No results for input(s): GLUCAP in the last 168 hours. D-Dimer No results for input(s): DDIMER in the last 72 hours. Hgb A1c No results for input(s): HGBA1C in the last 72 hours. Lipid Profile No results for input(s): CHOL, HDL, LDLCALC, TRIG, CHOLHDL, LDLDIRECT in the last 72 hours. Thyroid function studies No results for input(s): TSH, T4TOTAL, T3FREE, THYROIDAB in the last 72 hours.  Invalid input(s): FREET3 Anemia work up No results for input(s): VITAMINB12, FOLATE, FERRITIN, TIBC, IRON, RETICCTPCT in the last 72 hours. Urinalysis    Component Value Date/Time   COLORURINE YELLOW 12/27/2012 [place] 12/27/2012 0454   LABSPEC 1.042 (H) 12/27/2012 0454   PHURINE 6.0 12/27/2012 0454   GLUCOSEU NEGATIVE 12/27/2012 0454   HGBUR NEGATIVE 12/27/2012 0454   BILIRUBINUR NEGATIVE  12/27/2012 0454   KETONESUR >80 (A) 12/27/2012 0454   PROTEINUR 30 (A) 12/27/2012 0454   UROBILINOGEN 0.2 12/27/2012 0454   NITRITE NEGATIVE 12/27/2012 0454   LEUKOCYTESUR NEGATIVE 12/27/2012 0454   Sepsis Labs Invalid input(s): PROCALCITONIN,  WBC,  LACTICIDVEN Microbiology Recent Results (from the past 240 hour(s))  Culture, blood (Routine X 2) w Reflex to ID Panel     Status: None (Preliminary result)   Collection Time: 06/15/18   5:28 PM  Result Value Ref Range Status   Specimen Description   Final    BLOOD LEFT ANTECUBITAL Performed at [facility], [place] [address] [place], [place] [address]    Special Requests   Final    BOTTLES DRAWN AEROBIC AND ANAEROBIC Blood Culture adequate volume Performed at [facility] [address], [place], [place] [address]    Culture   Final    NO GROWTH 3 DAYS Performed at [facility], [place] [address], [place], [place] [address]    Report Status PENDING  Incomplete  Culture, blood (Routine X 2) w Reflex to ID Panel     Status: None (Preliminary result)   Collection Time: 06/15/18  5:33 PM  Result Value Ref Range Status   Specimen Description   Final    BLOOD LEFT ARM Performed at [facility] [address] [place], [place] [address]    Special Requests   Final    BOTTLES DRAWN AEROBIC ONLY Blood Culture adequate volume Performed at [facility] [address], [place], [place] [address]    Culture   Final    NO GROWTH 3 DAYS Performed at [facility], [place] [address] [place], [place] [address]    Report Status PENDING  Incomplete  MRSA PCR Screening     Status: None   Collection Time: 06/16/18 11:05 AM  Result Value Ref Range Status   MRSA by PCR NEGATIVE NEGATIVE Final    Comment:        The GeneXpert MRSA Assay (FDA approved for NASAL specimens only), is one component of a comprehensive MRSA colonization surveillance program. It is not intended to diagnose MRSA infection nor to guide or monitor treatment for MRSA infections. Performed at [facility], [place] [address] [place], [place] [address]   Expectorated sputum assessment w rflx to resp cult     Status: None   Collection Time: 06/17/18  9:47 AM  Result Value Ref Range Status   Specimen Description SPUTUM  Final   Special Requests NONE  Final   Sputum evaluation   Final    THIS SPECIMEN IS  ACCEPTABLE FOR SPUTUM CULTURE Performed at [facility], [place] [address], [place], [place] [address]    Report Status 06/17/2018 FINAL  Final  Culture, respiratory     Status: None (Preliminary result)   Collection Time: 06/17/18  9:47 AM  Result Value Ref Range Status   Specimen Description   Final    SPUTUM Performed at [facility] [address], [place], [place] [address]    Special Requests   Final    NONE Reflexed from [phone] Performed at [facility], [place] [address] [place], [place] [address]    Gram Stain   Final    NO WBC SEEN FEW GRAM POSITIVE COCCI IN PAIRS FEW GRAM POSITIVE RODS FEW BUDDING YEAST SEEN    Culture   Final    CULTURE REINCUBATED  FOR BETTER GROWTH Performed at [facility], [place] [address], [place], [place] [address]    Report Status PENDING  Incomplete     Time coordinating discharge: 35 minutes.   SIGNED:   Elmarie Shiley, MD  [facility] 06/18/2018, 6:40 PM Pager   If 7PM-7AM, please contact night-coverage [email or web address] Password [email or web address]

## 2018-06-18 NOTE — Care Management Note (Signed)
Case Management Note  Patient Details  Name: Claudia Wiley MRN: [id number] Date of Birth: [birth year]  Subjective/Objective:Qualifies for home 02.home 02 ordered.[facility] rep Santiago Glad aware of d/c & order-will deliver home 02 travel tank to rm prior d/c. No further CM needs.                    Action/Plan:dc home.   Expected Discharge Date:  06/18/18               Expected Discharge Plan:  Home/Self Care  In-House Referral:     Discharge planning Services  CM Consult  Post Acute Care Choice:    Choice offered to:     DME Arranged:  3-N-1, Oxygen DME Agency:  [place]:    [facility] Agency:     Status of Service:  Completed, signed off  If discussed at [place] of Stay Meetings, dates discussed:    Additional Comments:  Dessa Phi, RN 06/18/2018, 9:49 AM

## 2018-06-18 NOTE — Progress Notes (Signed)
Noted patient has VA benefit will check with [place] for Home dme provider, & travel tank.

## 2018-06-18 NOTE — Progress Notes (Signed)
Received call back from [facility]-Wanda([phone] for patient's NP Mrs. Claudia Wiley received all info & faxed to [place] said Caryl Pina the rep should call me with an update.Staff Nurse updated.

## 2018-06-19 LAB — CULTURE, RESPIRATORY W GRAM STAIN: Gram Stain: NONE SEEN

## 2018-06-20 LAB — CULTURE, BLOOD (ROUTINE X 2)
Culture: NO GROWTH
Culture: NO GROWTH
Special Requests: ADEQUATE
Special Requests: ADEQUATE

## 2019-04-02 ENCOUNTER — Encounter (HOSPITAL_COMMUNITY): Payer: Self-pay | Admitting: Emergency Medicine

## 2019-04-02 ENCOUNTER — Other Ambulatory Visit: Payer: Self-pay

## 2019-04-02 ENCOUNTER — Inpatient Hospital Stay (HOSPITAL_COMMUNITY): Payer: No Typology Code available for payment source

## 2019-04-02 ENCOUNTER — Inpatient Hospital Stay (HOSPITAL_COMMUNITY)
Admission: EM | Admit: 2019-04-02 | Discharge: 2019-04-07 | DRG: 189 | Disposition: A | Discharge status: Home Health | Payer: No Typology Code available for payment source | Attending: Internal Medicine | Admitting: Internal Medicine

## 2019-04-02 DIAGNOSIS — J9622 Acute and chronic respiratory failure with hypercapnia: Secondary | ICD-10-CM

## 2019-04-02 DIAGNOSIS — Z881 Allergy status to other antibiotic agents status: Secondary | ICD-10-CM

## 2019-04-02 DIAGNOSIS — R0902 Hypoxemia: Secondary | ICD-10-CM

## 2019-04-02 DIAGNOSIS — G629 Polyneuropathy, unspecified: Secondary | ICD-10-CM | POA: Diagnosis present

## 2019-04-02 DIAGNOSIS — Z79899 Other long term (current) drug therapy: Secondary | ICD-10-CM

## 2019-04-02 DIAGNOSIS — R112 Nausea with vomiting, unspecified: Secondary | ICD-10-CM | POA: Diagnosis present

## 2019-04-02 DIAGNOSIS — K8689 Other specified diseases of pancreas: Secondary | ICD-10-CM | POA: Diagnosis present

## 2019-04-02 DIAGNOSIS — Z800 Family history of malignant neoplasm of digestive organs: Secondary | ICD-10-CM

## 2019-04-02 DIAGNOSIS — F17210 Nicotine dependence, cigarettes, uncomplicated: Secondary | ICD-10-CM | POA: Diagnosis present

## 2019-04-02 DIAGNOSIS — J9602 Acute respiratory failure with hypercapnia: Secondary | ICD-10-CM | POA: Diagnosis present

## 2019-04-02 DIAGNOSIS — I10 Essential (primary) hypertension: Secondary | ICD-10-CM | POA: Diagnosis present

## 2019-04-02 DIAGNOSIS — Z9981 Dependence on supplemental oxygen: Secondary | ICD-10-CM

## 2019-04-02 DIAGNOSIS — R8290 Unspecified abnormal findings in urine: Secondary | ICD-10-CM | POA: Diagnosis present

## 2019-04-02 DIAGNOSIS — R109 Unspecified abdominal pain: Secondary | ICD-10-CM

## 2019-04-02 DIAGNOSIS — I119 Hypertensive heart disease without heart failure: Secondary | ICD-10-CM | POA: Diagnosis present

## 2019-04-02 DIAGNOSIS — Z20828 Contact with and (suspected) exposure to other viral communicable diseases: Secondary | ICD-10-CM | POA: Diagnosis present

## 2019-04-02 DIAGNOSIS — Z836 Family history of other diseases of the respiratory system: Secondary | ICD-10-CM

## 2019-04-02 DIAGNOSIS — Z808 Family history of malignant neoplasm of other organs or systems: Secondary | ICD-10-CM

## 2019-04-02 DIAGNOSIS — Z888 Allergy status to other drugs, medicaments and biological substances status: Secondary | ICD-10-CM

## 2019-04-02 DIAGNOSIS — K219 Gastro-esophageal reflux disease without esophagitis: Secondary | ICD-10-CM | POA: Diagnosis present

## 2019-04-02 DIAGNOSIS — Z90411 Acquired partial absence of pancreas: Secondary | ICD-10-CM

## 2019-04-02 DIAGNOSIS — J9601 Acute respiratory failure with hypoxia: Secondary | ICD-10-CM | POA: Diagnosis not present

## 2019-04-02 DIAGNOSIS — G4700 Insomnia, unspecified: Secondary | ICD-10-CM | POA: Diagnosis present

## 2019-04-02 DIAGNOSIS — J441 Chronic obstructive pulmonary disease with (acute) exacerbation: Secondary | ICD-10-CM | POA: Diagnosis present

## 2019-04-02 DIAGNOSIS — Z902 Acquired absence of lung [part of]: Secondary | ICD-10-CM | POA: Diagnosis not present

## 2019-04-02 DIAGNOSIS — Z7951 Long term (current) use of inhaled steroids: Secondary | ICD-10-CM

## 2019-04-02 DIAGNOSIS — E559 Vitamin D deficiency, unspecified: Secondary | ICD-10-CM | POA: Diagnosis present

## 2019-04-02 DIAGNOSIS — Z85028 Personal history of other malignant neoplasm of stomach: Secondary | ICD-10-CM

## 2019-04-02 DIAGNOSIS — F329 Major depressive disorder, single episode, unspecified: Secondary | ICD-10-CM | POA: Diagnosis present

## 2019-04-02 DIAGNOSIS — Z9049 Acquired absence of other specified parts of digestive tract: Secondary | ICD-10-CM

## 2019-04-02 DIAGNOSIS — Z23 Encounter for immunization: Secondary | ICD-10-CM | POA: Diagnosis not present

## 2019-04-02 DIAGNOSIS — Z85118 Personal history of other malignant neoplasm of bronchus and lung: Secondary | ICD-10-CM

## 2019-04-02 DIAGNOSIS — E876 Hypokalemia: Secondary | ICD-10-CM | POA: Diagnosis present

## 2019-04-02 DIAGNOSIS — Z8719 Personal history of other diseases of the digestive system: Secondary | ICD-10-CM

## 2019-04-02 DIAGNOSIS — M810 Age-related osteoporosis without current pathological fracture: Secondary | ICD-10-CM | POA: Diagnosis present

## 2019-04-02 DIAGNOSIS — R0600 Dyspnea, unspecified: Secondary | ICD-10-CM | POA: Diagnosis present

## 2019-04-02 DIAGNOSIS — Z716 Tobacco abuse counseling: Secondary | ICD-10-CM | POA: Diagnosis not present

## 2019-04-02 DIAGNOSIS — Z90710 Acquired absence of both cervix and uterus: Secondary | ICD-10-CM | POA: Diagnosis not present

## 2019-04-02 DIAGNOSIS — Z833 Family history of diabetes mellitus: Secondary | ICD-10-CM

## 2019-04-02 DIAGNOSIS — Z8249 Family history of ischemic heart disease and other diseases of the circulatory system: Secondary | ICD-10-CM

## 2019-04-02 LAB — COMPREHENSIVE METABOLIC PANEL
ALT: 15 U/L (ref 0–44)
AST: 25 U/L (ref 15–41)
Albumin: 3.5 g/dL (ref 3.5–5.0)
Alkaline Phosphatase: 85 U/L (ref 38–126)
Anion gap: 14 (ref 5–15)
BUN: 7 mg/dL (ref 6–20)
CO2: 31 mmol/L (ref 22–32)
Calcium: 8.8 mg/dL — ABNORMAL LOW (ref 8.9–10.3)
Chloride: 98 mmol/L (ref 98–111)
Creatinine, Ser: 0.7 mg/dL (ref 0.44–1.00)
GFR calc Af Amer: >60 mL/min (ref >60)
GFR calc non Af Amer: >60 mL/min (ref >60)
Glucose, Bld: 87 mg/dL (ref 70–99)
Potassium: 3.2 mmol/L — ABNORMAL LOW (ref 3.5–5.1)
Sodium: 143 mmol/L (ref 135–145)
Total Bilirubin: 0.2 mg/dL — ABNORMAL LOW (ref 0.3–1.2)
Total Protein: 6.3 g/dL — ABNORMAL LOW (ref 6.5–8.1)

## 2019-04-02 LAB — URINALYSIS, ROUTINE W REFLEX MICROSCOPIC
Bacteria, UA: NONE SEEN
Bilirubin Urine: NEGATIVE
Glucose, UA: NEGATIVE mg/dL
Hgb urine dipstick: NEGATIVE
Ketones, ur: 20 mg/dL — AB
Nitrite: NEGATIVE
Protein, ur: NEGATIVE mg/dL
Specific Gravity, Urine: 1.006 (ref 1.005–1.030)
pH: 6 (ref 5.0–8.0)

## 2019-04-02 LAB — CBC WITH DIFFERENTIAL/PLATELET
Abs Immature Granulocytes: 0.02 10*3/uL (ref 0.00–0.07)
Basophils Absolute: 0.1 10*3/uL (ref 0.0–0.1)
Basophils Relative: 1 %
Eosinophils Absolute: 0 10*3/uL (ref 0.0–0.5)
Eosinophils Relative: 0 %
HCT: 43.2 % (ref 36.0–46.0)
Hemoglobin: 13.5 g/dL (ref 12.0–15.0)
Immature Granulocytes: 0 %
Lymphocytes Relative: 42 %
Lymphs Abs: 4.6 10*3/uL — ABNORMAL HIGH (ref 0.7–4.0)
MCH: 27.8 pg (ref 26.0–34.0)
MCHC: 31.3 g/dL (ref 30.0–36.0)
MCV: 88.9 fL (ref 80.0–100.0)
Monocytes Absolute: 0.9 10*3/uL (ref 0.1–1.0)
Monocytes Relative: 8 %
Neutro Abs: 5.3 10*3/uL (ref 1.7–7.7)
Neutrophils Relative %: 49 %
Platelets: 265 10*3/uL (ref 150–400)
RBC: 4.86 MIL/uL (ref 3.87–5.11)
RDW: 16.3 % — ABNORMAL HIGH (ref 11.5–15.5)
WBC: 10.8 10*3/uL — ABNORMAL HIGH (ref 4.0–10.5)
nRBC: 0 % (ref 0.0–0.2)

## 2019-04-02 LAB — BLOOD GAS, VENOUS
Acid-Base Excess: 9.3 mmol/L — ABNORMAL HIGH (ref 0.0–2.0)
Bicarbonate: 37.6 mmol/L — ABNORMAL HIGH (ref 20.0–28.0)
O2 Content: 2 L/min
O2 Saturation: 97.4 %
Patient temperature: 98.6
pCO2, Ven: 74 mmHg (ref 44.0–60.0)
pH, Ven: 7.326 (ref 7.250–7.430)
pO2, Ven: 103 mmHg — ABNORMAL HIGH (ref 32.0–45.0)

## 2019-04-02 LAB — SARS CORONAVIRUS 2 BY RT PCR (HOSPITAL ORDER, PERFORMED IN ~~LOC~~ HOSPITAL LAB): SARS Coronavirus 2: NEGATIVE

## 2019-04-02 MED ORDER — HYDRALAZINE HCL 20 MG/ML IJ SOLN: 10.0000 mg | Freq: Four times a day (QID) | INTRAMUSCULAR | Status: DC | PRN

## 2019-04-02 MED ORDER — METHYLPREDNISOLONE SODIUM SUCC 125 MG IJ SOLR
80.0000 mg | Freq: Three times a day (TID) | INTRAMUSCULAR | Status: DC
  Administered 2019-04-03 – 2019-04-05 (×7): 80 mg via INTRAVENOUS
  Filled 2019-04-02 (×7): qty 2

## 2019-04-02 MED ORDER — ALBUTEROL SULFATE (2.5 MG/3ML) 0.083% IN NEBU
3.0000 mL | INHALATION_SOLUTION | Freq: Four times a day (QID) | RESPIRATORY_TRACT | Status: DC
  Administered 2019-04-03: 3 mL via RESPIRATORY_TRACT
  Filled 2019-04-02 (×2): qty 3

## 2019-04-02 MED ORDER — SODIUM CHLORIDE 0.9 % IV SOLN
1.0000 g | Freq: Every day | INTRAVENOUS | Status: DC
  Administered 2019-04-03: 1 g via INTRAVENOUS
  Filled 2019-04-02 (×2): qty 10

## 2019-04-02 MED ORDER — TRAMADOL HCL 50 MG PO TABS
50.0000 mg | ORAL_TABLET | Freq: Four times a day (QID) | ORAL | Status: DC | PRN
  Administered 2019-04-04 – 2019-04-06 (×3): 50 mg via ORAL
  Filled 2019-04-02 (×3): qty 1

## 2019-04-02 MED ORDER — SODIUM CHLORIDE 0.9% FLUSH
3.0000 mL | INTRAVENOUS | Status: DC | PRN
  Administered 2019-04-07: 11:00:00 3 mL via INTRAVENOUS
  Filled 2019-04-02: qty 3

## 2019-04-02 MED ORDER — ACETAMINOPHEN 650 MG RE SUPP: 650.0000 mg | Freq: Four times a day (QID) | RECTAL | Status: DC | PRN

## 2019-04-02 MED ORDER — ONDANSETRON HCL 4 MG/2ML IJ SOLN
4.0000 mg | Freq: Four times a day (QID) | INTRAMUSCULAR | Status: DC | PRN
  Administered 2019-04-05: 4 mg via INTRAVENOUS
  Filled 2019-04-02: qty 2

## 2019-04-02 MED ORDER — SODIUM CHLORIDE 0.9 % IV SOLN
100.0000 mg | Freq: Two times a day (BID) | INTRAVENOUS | Status: DC
  Filled 2019-04-02: qty 100

## 2019-04-02 MED ORDER — MAGNESIUM SULFATE 2 GM/50ML IV SOLN
2.0000 g | Freq: Once | INTRAVENOUS | Status: AC
  Administered 2019-04-02: 22:00:00 2 g via INTRAVENOUS
  Filled 2019-04-02: qty 50

## 2019-04-02 MED ORDER — SODIUM CHLORIDE 0.9 % IV SOLN: 250.0000 mL | INTRAVENOUS | Status: DC | PRN

## 2019-04-02 MED ORDER — ENOXAPARIN SODIUM 40 MG/0.4ML ~~LOC~~ SOLN
40.0000 mg | SUBCUTANEOUS | Status: DC
  Administered 2019-04-03 – 2019-04-07 (×5): 40 mg via SUBCUTANEOUS
  Filled 2019-04-02 (×6): qty 0.4

## 2019-04-02 MED ORDER — ACETAMINOPHEN 325 MG PO TABS
650.0000 mg | ORAL_TABLET | Freq: Four times a day (QID) | ORAL | Status: DC | PRN
  Administered 2019-04-03: 650 mg via ORAL
  Filled 2019-04-02: qty 2

## 2019-04-02 MED ORDER — METHYLPREDNISOLONE SODIUM SUCC 125 MG IJ SOLR
125.0000 mg | Freq: Once | INTRAMUSCULAR | Status: AC
  Administered 2019-04-02: 125 mg via INTRAVENOUS
  Filled 2019-04-02: qty 2

## 2019-04-02 MED ORDER — POTASSIUM CHLORIDE 10 MEQ/100ML IV SOLN
10.0000 meq | INTRAVENOUS | Status: AC
  Administered 2019-04-03: 10 meq via INTRAVENOUS
  Filled 2019-04-02: qty 100

## 2019-04-02 MED ORDER — ALBUTEROL (5 MG/ML) CONTINUOUS INHALATION SOLN
10.0000 mg/h | INHALATION_SOLUTION | Freq: Once | RESPIRATORY_TRACT | Status: AC
  Administered 2019-04-02: 10 mg/h via RESPIRATORY_TRACT
  Filled 2019-04-02: qty 20

## 2019-04-02 NOTE — ED Notes (Signed)
Per [facility], patient presented with fever and low SATS-performed Covid Test-states sending to ED for Covid work up

## 2019-04-02 NOTE — ED Triage Notes (Signed)
Patient complains of emesis for 3 days. Covid test performed today at the [place], but the patient does not know the result. MD told Patient to go to the ED.

## 2019-04-02 NOTE — ED Provider Notes (Signed)
[facility] DEPT Provider Note   CSN: [id number] Arrival date & time: 04/02/19  1622     History   Chief Complaint Chief Complaint  Patient presents with  . Emesis  . Fever  . Nausea    HPI Claudia Wiley is a 63 y.o. female.     HPI   She presents for evaluation of vomiting for 3 days.  She states she went to the [place] today, where they did a cover test and then suggested that she come here for "fluids."  Recall the test has not resulted yet.  Patient was hypoxic, on arrival to the ER today and was placed on nasal cannula oxygen, with improvement.  She denies fever, change in cough, known Covid-19 exposure or other recent illnesses.  She is using her usual medications including nebulizer for COPD.  There are no other known modifying factors.  Past Medical History:  Diagnosis Date  . Anemia   . Arthritis   . Cancer [facility]) 2013   Upper left lung and stomach cancer  . COPD (chronic obstructive pulmonary disease) ([place])   . Esophageal stricture   . Family history of brain cancer   . Family history of melanoma   . Gastrinoma   . Gastrinoma   . GERD (gastroesophageal reflux disease)   . Hypertension   . Osteoporosis   . Pancreatic insufficiency   . Pneumonia   . Vitamin D deficiency   . Weight loss, unintentional     Patient Active Problem List   Diagnosis Date Noted  . COPD exacerbation ([place]) 06/15/2018  . Essential hypertension 06/15/2018  . Genetic testing 06/01/2018  . Gastrinoma   . Family history of melanoma   . Family history of brain cancer   . Hypokalemia 10/19/2011  . Severe protein-calorie malnutrition ([place]) 10/19/2011  . Weight loss 10/19/2011  . Generalized weakness 10/19/2011  . Dysphagia 10/19/2011  . Anodontia 10/19/2011    Past Surgical History:  Procedure Laterality Date  . ABDOMINAL HYSTERECTOMY    . BREAST LUMPECTOMY    . CESAREAN SECTION    . LUNG SURGERY       OB History   No obstetric history on file.      Home Medications    Prior to Admission medications   Medication Sig Start Date End Date Taking? Authorizing Provider  albuterol (PROVENTIL HFA;VENTOLIN HFA) 108 (90 Base) MCG/ACT inhaler Inhale into the lungs every 6 (six) hours as needed for wheezing or shortness of breath.    [provider]  albuterol (PROVENTIL HFA;VENTOLIN HFA) 108 (90 Base) MCG/ACT inhaler Inhale 2 puffs into the lungs every 6 (six) hours as needed for wheezing or shortness of breath. 06/18/18   Regalado, Belkys A, MD  amitriptyline (ELAVIL) 10 MG tablet Take 10 mg by mouth at bedtime.    [provider]  amLODipine (NORVASC) 2.5 MG tablet Take 2.5 mg by mouth daily.    [provider]  budesonide-formoterol (SYMBICORT) 160-4.5 MCG/ACT inhaler Inhale 1 puff into the lungs daily. 06/18/18   Regalado, Belkys A, MD  cholecalciferol (VITAMIN D) 400 units TABS tablet Take 800 Units by mouth.    [provider]  Cyanocobalamin (VITAMIN B-12 IJ) Inject as directed every 28 (twenty-eight) days.    [provider]  ENSURE (ENSURE) Take 237 mLs by mouth 3 (three) times daily between meals.    [provider]  fluconazole (DIFLUCAN) 100 MG tablet Take 1 tablet (100 mg total) by mouth daily. 06/18/18  Regalado, Belkys A, MD  guaiFENesin (MUCINEX) 600 MG 12 hr tablet Take 1 tablet (600 mg total) by mouth 2 (two) times daily. 06/18/18   Regalado, Belkys A, MD  lipase/protease/amylase (CREON) 12000 units CPEP capsule Take 24,000 Units by mouth 2 (two) times daily.     [provider]  mirtazapine (REMERON) 15 MG tablet Take 15 mg by mouth at bedtime.    [provider]  Multiple Vitamin (MULTIVITAMIN) tablet Take 1 tablet by mouth daily.    [provider]  pantoprazole (PROTONIX) 40 MG tablet Take 40 mg by mouth daily.    [provider]  pregabalin (LYRICA) 150 MG capsule Take 150 mg by mouth 2 (two) times daily.    [provider]   SUMAtriptan (IMITREX) 50 MG tablet Take 1 tablet by mouth every 2 (two) hours as needed for migraine. May repeat in 2 hours if headache persists or recurs. No more than 200mg  per day    [provider]  tiotropium (SPIRIVA) 18 MCG inhalation capsule Place 18 mcg into inhaler and inhale daily.    [provider]  tiZANidine (ZANAFLEX) 4 MG capsule Take 2 mg by mouth at bedtime.     [provider]    Family History Family History  Problem Relation Age of Onset  . Diabetes Sister        x 3  . Cancer Sister 24       brain that metastized to bone, stomach and other organs  . Melanoma Maternal Aunt   . Heart attack Father   . Pneumonia Brother   . Cancer Other 17       cancer in his chest - nephew  . Colon cancer Neg Hx   . Pancreatic cancer Neg Hx   . Rectal cancer Neg Hx   . Esophageal cancer Neg Hx     Social History Social History   Tobacco Use  . Smoking status: Current Every Day Smoker    Packs/day: 0.25    Years: 2.00    Pack years: 0.50    Types: Cigarettes  . Smokeless tobacco: Never Used  . Tobacco comment: Not ready  Substance Use Topics  . Alcohol use: No  . Drug use: No     Allergies   Azithromycin and Gabapentin   Review of Systems Review of Systems  All other systems reviewed and are negative.    Physical Exam Updated Vital Signs BP (!) 144/88 (BP Location: Left Arm)   Pulse (!) 108   Temp 98.6 F (37 C) (Oral)   Resp 15   SpO2 100%   Physical Exam Vitals signs and nursing note reviewed.  Constitutional:      General: She is in acute distress.     Appearance: She is well-developed. She is ill-appearing and toxic-appearing.     Comments: Frail  HENT:     Head: Normocephalic and atraumatic.     Right Ear: External ear normal.     Left Ear: External ear normal.  Eyes:     Conjunctiva/sclera: Conjunctivae normal.     Pupils: Pupils are equal, round, and reactive to light.  Neck:     Musculoskeletal: Normal  range of motion and neck supple.     Trachea: Phonation normal.  Cardiovascular:     Rate and Rhythm: Normal rate and regular rhythm.     Heart sounds: Normal heart sounds.  Pulmonary:     Effort: Pulmonary effort is normal. No respiratory distress.  Breath sounds: Normal breath sounds. No stridor.     Comments: Symmetric bilateral decreased air movement with generalized wheezing.  No increased work of breathing. Chest:     Chest wall: No tenderness.  Abdominal:     General: There is no distension.     Palpations: Abdomen is soft.     Tenderness: There is no abdominal tenderness.  Musculoskeletal: Normal range of motion.        General: No swelling or tenderness.     Right lower leg: No edema.     Left lower leg: No edema.  Skin:    General: Skin is warm and dry.  Neurological:     Mental Status: She is alert and oriented to person, place, and time.     Cranial Nerves: No cranial nerve deficit.     Sensory: No sensory deficit.     Motor: No abnormal muscle tone.     Coordination: Coordination normal.  Psychiatric:        Mood and Affect: Mood normal.        Behavior: Behavior normal.        Thought Content: Thought content normal.        Judgment: Judgment normal.      ED Treatments / Results  Labs (all labs ordered are listed, but only abnormal results are displayed) Labs Reviewed  COMPREHENSIVE METABOLIC PANEL - Abnormal; Notable for the following components:      Result Value   Potassium 3.2 (*)    Calcium 8.8 (*)    Total Protein 6.3 (*)    Total Bilirubin 0.2 (*)    All other components within normal limits  CBC WITH DIFFERENTIAL/PLATELET - Abnormal; Notable for the following components:   WBC 10.8 (*)    RDW 16.3 (*)    Lymphs Abs 4.6 (*)    All other components within normal limits  URINALYSIS, ROUTINE W REFLEX MICROSCOPIC - Abnormal; Notable for the following components:   Ketones, ur 20 (*)    Leukocytes,Ua TRACE (*)    All other components within  normal limits  BLOOD GAS, VENOUS - Abnormal; Notable for the following components:   pCO2, Ven 74.0 (*)    pO2, Ven 103.0 (*)    Bicarbonate 37.6 (*)    Acid-Base Excess 9.3 (*)    All other components within normal limits  SARS CORONAVIRUS 2 (HOSPITAL ORDER, [place] LAB)    EKG None  Radiology No results found.  Procedures .Critical Care Performed by: Daleen Bo, MD Authorized by: Daleen Bo, MD   Critical care provider statement:    Critical care time (minutes):  35   Critical care start time:  04/02/2019 6:50 PM   Critical care end time:  04/02/2019 9:18 PM   Critical care time was exclusive of:  Separately billable procedures and treating other patients   Critical care was necessary to treat or prevent imminent or life-threatening deterioration of the following conditions:  Respiratory failure   Critical care was time spent personally by me on the following activities:  Blood draw for specimens, development of treatment plan with patient or surrogate, discussions with consultants, evaluation of patient's response to treatment, examination of patient, obtaining history from patient or surrogate, ordering and performing treatments and interventions, ordering and review of laboratory studies, pulse oximetry, re-evaluation of patient's condition, review of old charts and ordering and review of radiographic studies   (including critical care time)  Medications Ordered in ED Medications  albuterol (  PROVENTIL,VENTOLIN) solution continuous neb (has no administration in time range)  methylPREDNISolone sodium succinate (SOLU-MEDROL) 125 mg/2 mL injection 125 mg (has no administration in time range)  magnesium sulfate IVPB 2 g 50 mL (has no administration in time range)     Initial Impression / Assessment and Plan / ED Course  I have reviewed the triage vital signs and the nursing notes.  Pertinent labs & imaging results that were available during my  care of the patient were reviewed by me and considered in my medical decision making (see chart for details).  Clinical Course as of Apr 01 2117  Fri Apr 02, 2019  2113 Normal except elevated PCO2, O2 and bicarb on venous gas  Blood gas, venous(!!) [EW]  2113 Normal except elevated ketones, and leukocytes  Urinalysis, Routine w reflex microscopic(!) [EW]  2113 Normal except elevated white count  CBC with Differential(!) [EW]  2113 Normal except potassium low, calcium low, total protein low   [EW]  2114 Normal  SARS Coronavirus 2 [facility] order, Performed in [place] hospital lab) Nasopharyngeal Nasopharyngeal Swab [EW]    Clinical Course User Index [EW] Daleen Bo, MD        Patient Vitals for the past 24 hrs:  BP Temp Temp src Pulse Resp SpO2  04/02/19 1947 (!) 144/88 98.6 F (37 C) Oral (!) 108 15 100 %  04/02/19 1846 112/73 - - 98 10 100 %  04/02/19 1830 - - - 98 10 100 %  04/02/19 1800 - - - (!) 101 17 100 %  04/02/19 1730 - - - (!) 101 10 100 %  04/02/19 1715 - - - - - 99 %  04/02/19 1707 125/87 98.9 F (37.2 C) Oral (!) 113 20 (!) 83 %    9:18 PM Reevaluation with update and discussion. After initial assessment and treatment, an updated evaluation reveals no change in clinical status.  Patient updated on findings and plan. Daleen Bo   Medical Decision Making: Evaluation consistent with COPD exacerbation, with hypoxia and increased PCO2.  No recent comparison blood gas testing.  She will require hospitalization for further treatment and stabilization.  CRITICAL CARE- yes Performed by: Daleen Bo  Nursing Notes Reviewed/ Care Coordinated Applicable Imaging Reviewed Interpretation of Laboratory Data incorporated into ED treatment  9:17 PM-Consult complete with hospitalist. Patient case explained and discussed.  He agrees to admit patient for further evaluation and treatment. Call ended at 9:45 PM  Plan: Admit  Final Clinical Impressions(s) / ED  Diagnoses   Final diagnoses:  Acute on chronic respiratory failure with hypercapnia (HCC)  Hypoxia  Nausea and vomiting, intractability of vomiting not specified, unspecified vomiting type    ED Discharge Orders    None       Daleen Bo, MD 04/02/19 2226

## 2019-04-02 NOTE — H&P (Addendum)
TRH H&P    Patient Demographics:    Claudia Wiley, is a 63 y.o. female  MRN: [id number]  DOB - 07/25/1955  Admit Date - 04/02/2019  Referring MD/NP/PA:  Daleen Bo  Outpatient Primary MD for the patient is System, Pcp Not In  Patient coming from:  home  Chief complaint- dyspnea   HPI:    Claudia Wiley  is a 63 y.o. female,  w h/o lung cancer s/p resection, h/o gastrinoma s/p whipple, esophageal stricture, pancreatic insufficiency, hypertension, Copd (not on home o2), apparently c/o dyspnea for the past several days and n/v.  Slight cough w white / clear sputum.  Pt denies fever, chills, cp, palp, abd pain, diarrhea, brbpr, black stool, dysuria, hematuria.   In ED,  T 98.9, P 113, R 20, Bp 125/87  Pox 83% on RA  CXR Negative for acute process  Wbc 10.8, Hgb 13.5, Plt 265 Na 143, K 3.2, Bun 7, Creatinine 0.70 Ast 25, Alt 15 Urinalysis wbc 5-10, rbc 0-5 covid -19 negative  PH 7.326, PCO2 74, PO2 103  Pt will be admitted for acute respiratory  failure with hypoxia, and hypercapnea secondary to Copd exacerbation.      Review of systems:    In addition to the HPI above,  No Fever-chills, No Headache, No changes with Vision or hearing, No problems swallowing food or Liquids, No Chest pain, No Abdominal pain, bowel movements are regular, No Blood in stool or Urine, No dysuria, No new skin rashes or bruises, No new joints pains-aches,  No new weakness, tingling, numbness in any extremity, No recent weight gain or loss, No polyuria, polydypsia or polyphagia, No significant Mental Stressors.  All other systems reviewed and are negative.    Past History of the following :    Past Medical History:  Diagnosis Date   Anemia    Arthritis    Cancer ([place]) 2013   Upper left lung and stomach cancer   COPD (chronic obstructive pulmonary disease) ([place])    Esophageal stricture    Family  history of brain cancer    Family history of melanoma    Gastrinoma    Gastrinoma    GERD (gastroesophageal reflux disease)    Hypertension    Osteoporosis    Pancreatic insufficiency    Pneumonia    Vitamin D deficiency    Weight loss, unintentional       Past Surgical History:  Procedure Laterality Date   ABDOMINAL HYSTERECTOMY     BREAST LUMPECTOMY     CESAREAN SECTION     LUNG SURGERY        Social History:      Social History   Tobacco Use   Smoking status: Current Every Day Smoker    Packs/day: 0.25    Years: 2.00    Pack years: 0.50    Types: Cigarettes   Smokeless tobacco: Never Used   Tobacco comment: Not ready  Substance Use Topics   Alcohol use: No       Family History :  Family History  Problem Relation Age of Onset   Diabetes Sister        x 3   Cancer Sister 46       brain that metastized to bone, stomach and other organs   Melanoma Maternal Aunt    Heart attack Father    Pneumonia Brother    Cancer Other 53       cancer in his chest - nephew   Colon cancer Neg Hx    Pancreatic cancer Neg Hx    Rectal cancer Neg Hx    Esophageal cancer Neg Hx        Home Medications:   Prior to Admission medications   Medication Sig Start Date End Date Taking? Authorizing Provider  acetaminophen (TYLENOL) 500 MG tablet Take 1,000 mg by mouth every 6 (six) hours as needed for moderate pain or headache.   Yes [provider]  albuterol (PROVENTIL HFA;VENTOLIN HFA) 108 (90 Base) MCG/ACT inhaler Inhale into the lungs every 6 (six) hours as needed for wheezing or shortness of breath.   Yes [provider]  amitriptyline (ELAVIL) 10 MG tablet Take 10 mg by mouth at bedtime.   Yes [provider]  amLODipine (NORVASC) 2.5 MG tablet Take 2.5 mg by mouth daily.   Yes [provider]  budesonide-formoterol (SYMBICORT) 160-4.5 MCG/ACT inhaler Inhale 1 puff into the lungs daily. 06/18/18  Yes  Regalado, Belkys A, MD  cholecalciferol (VITAMIN D) 400 units TABS tablet Take 800 Units by mouth.   Yes [provider]  ENSURE (ENSURE) Take 237 mLs by mouth 3 (three) times daily between meals.   Yes [provider]  lipase/protease/amylase (CREON) 12000 units CPEP capsule Take 24,000 Units by mouth 2 (two) times daily.    Yes [provider]  mirtazapine (REMERON) 15 MG tablet Take 15 mg by mouth at bedtime.   Yes [provider]  Multiple Vitamin (MULTIVITAMIN) tablet Take 1 tablet by mouth daily.   Yes [provider]  pantoprazole (PROTONIX) 40 MG tablet Take 40 mg by mouth daily.   Yes [provider]  pregabalin (LYRICA) 150 MG capsule Take 150 mg by mouth 2 (two) times daily.   Yes [provider]  tiotropium (SPIRIVA) 18 MCG inhalation capsule Place 18 mcg into inhaler and inhale daily.   Yes [provider]  tiZANidine (ZANAFLEX) 4 MG capsule Take 2 mg by mouth at bedtime.    Yes [provider]  albuterol (PROVENTIL HFA;VENTOLIN HFA) 108 (90 Base) MCG/ACT inhaler Inhale 2 puffs into the lungs every 6 (six) hours as needed for wheezing or shortness of breath. Patient not taking: Reported on 04/02/2019 06/18/18   Regalado, Jerald Kief A, MD  fluconazole (DIFLUCAN) 100 MG tablet Take 1 tablet (100 mg total) by mouth daily. Patient not taking: Reported on 04/02/2019 06/18/18   Regalado, Jerald Kief A, MD  guaiFENesin (MUCINEX) 600 MG 12 hr tablet Take 1 tablet (600 mg total) by mouth 2 (two) times daily. Patient not taking: Reported on 04/02/2019 06/18/18   Elmarie Shiley, MD     Allergies:     Allergies  Allergen Reactions   Azithromycin    Gabapentin      Physical Exam:   Vitals  Blood pressure (!) 142/88, pulse (!) 102, temperature 98.3 F (36.8 C), temperature source Oral, resp. rate 16, SpO2 100 %.  1.  General: axoxo3  2. Psychiatric: euthymic  3. Neurologic: cn2-12 intact, reflexes 2+  symmetric, diffuse with no clonus,  motor 5/5 in all 4 ext  4. HEENMT:  Anicteric, pupils 1.55mm symmetric, direct, consensual, near intact Neck: no jvd  5. Respiratory : + bilateral exp wheezing, no crackles  6. Cardiovascular : rrr s1, s2, no m/g/r  7. Gastrointestinal:  Abd: soft, nt, nd, +bs  8. Skin:  Ext: no c/c/e,  No rash  9.Musculoskeletal:  Good ROM,  No adenopathy    Data Review:    CBC Recent Labs  Lab 04/02/19 1923  WBC 10.8*  HGB 13.5  HCT 43.2  PLT 265  MCV 88.9  MCH 27.8  MCHC 31.3  RDW 16.3*  LYMPHSABS 4.6*  MONOABS 0.9  EOSABS 0.0  BASOSABS 0.1   ------------------------------------------------------------------------------------------------------------------  Results for orders placed or performed during the hospital encounter of 04/02/19 (from the past 48 hour(s))  Comprehensive metabolic panel     Status: Abnormal   Collection Time: 04/02/19  7:23 PM  Result Value Ref Range   Sodium 143 135 - 145 mmol/L   Potassium 3.2 (L) 3.5 - 5.1 mmol/L   Chloride 98 98 - 111 mmol/L   CO2 31 22 - 32 mmol/L   Glucose, Bld 87 70 - 99 mg/dL   BUN 7 6 - 20 mg/dL   Creatinine, Ser 0.70 0.44 - 1.00 mg/dL   Calcium 8.8 (L) 8.9 - 10.3 mg/dL   Total Protein 6.3 (L) 6.5 - 8.1 g/dL   Albumin 3.5 3.5 - 5.0 g/dL   AST 25 15 - 41 U/L   ALT 15 0 - 44 U/L   Alkaline Phosphatase 85 38 - 126 U/L   Total Bilirubin 0.2 (L) 0.3 - 1.2 mg/dL   GFR calc non Af Amer >60 >60 mL/min   GFR calc Af Amer >60 >60 mL/min   Anion gap 14 5 - 15    Comment: Performed at [facility], [place] [address], [place], [place] [address]  CBC with Differential     Status: Abnormal   Collection Time: 04/02/19  7:23 PM  Result Value Ref Range   WBC 10.8 (H) 4.0 - 10.5 K/uL   RBC 4.86 3.87 - 5.11 MIL/uL   Hemoglobin 13.5 12.0 - 15.0 g/dL   HCT 43.2 36.0 - 46.0 %   MCV 88.9 80.0 - 100.0 fL   MCH 27.8 26.0 - 34.0 pg   MCHC 31.3 30.0 - 36.0 g/dL   RDW 16.3 (H) 11.5 -  15.5 %   Platelets 265 150 - 400 K/uL   nRBC 0.0 0.0 - 0.2 %   Neutrophils Relative % 49 %   Neutro Abs 5.3 1.7 - 7.7 K/uL   Lymphocytes Relative 42 %   Lymphs Abs 4.6 (H) 0.7 - 4.0 K/uL   Monocytes Relative 8 %   Monocytes Absolute 0.9 0.1 - 1.0 K/uL   Eosinophils Relative 0 %   Eosinophils Absolute 0.0 0.0 - 0.5 K/uL   Basophils Relative 1 %   Basophils Absolute 0.1 0.0 - 0.1 K/uL   Immature Granulocytes 0 %   Abs Immature Granulocytes 0.02 0.00 - 0.07 K/uL    Comment: Performed at [facility], [place] [address] [place], [place] [address]  SARS Coronavirus 2 [facility] order, Performed in [facility] hospital lab) Nasopharyngeal Nasopharyngeal Swab     Status: None   Collection Time: 04/02/19  7:39 PM   Specimen: Nasopharyngeal Swab  Result Value Ref Range   SARS Coronavirus 2 NEGATIVE NEGATIVE    Comment: (NOTE) If result is NEGATIVE SARS-CoV-2 target  nucleic acids are NOT DETECTED. The SARS-CoV-2 RNA is generally detectable in upper and lower  respiratory specimens during the acute phase of infection. The lowest  concentration of SARS-CoV-2 viral copies this assay can detect is 250  copies / mL. A negative result does not preclude SARS-CoV-2 infection  and should not be used as the sole basis for treatment or other  patient management decisions.  A negative result may occur with  improper specimen collection / handling, submission of specimen other  than nasopharyngeal swab, presence of viral mutation(s) within the  areas targeted by this assay, and inadequate number of viral copies  (<250 copies / mL). A negative result must be combined with clinical  observations, patient history, and epidemiological information. If result is POSITIVE SARS-CoV-2 target nucleic acids are DETECTED. The SARS-CoV-2 RNA is generally detectable in upper and lower  respiratory specimens dur ing the acute phase of infection.  Positive  results are indicative of active infection  with SARS-CoV-2.  Clinical  correlation with patient history and other diagnostic information is  necessary to determine patient infection status.  Positive results do  not rule out bacterial infection or co-infection with other viruses. If result is PRESUMPTIVE POSTIVE SARS-CoV-2 nucleic acids MAY BE PRESENT.   A presumptive positive result was obtained on the submitted specimen  and confirmed on repeat testing.  While 2019 novel coronavirus  (SARS-CoV-2) nucleic acids may be present in the submitted sample  additional confirmatory testing may be necessary for epidemiological  and / or clinical management purposes  to differentiate between  SARS-CoV-2 and other Sarbecovirus currently known to infect humans.  If clinically indicated additional testing with an alternate test  methodology [phone]) is advised. The SARS-CoV-2 RNA is generally  detectable in upper and lower respiratory sp ecimens during the acute  phase of infection. The expected result is Negative. Fact Sheet for Patients:  StrictlyIdeas.no Fact Sheet for Healthcare Providers: BankingDealers.co.za This test is not yet approved or cleared by the [place] FDA and has been authorized for detection and/or diagnosis of SARS-CoV-2 by FDA under an Emergency Use Authorization (EUA).  This EUA will remain in effect (meaning this test can be used) for the duration of the COVID-19 declaration under Section 564(b)(1) of the Act, 21 U.S.C. section 360bbb-3(b)(1), unless the authorization is terminated or revoked sooner. Performed at [facility], [place] [address] [place], [place] [address]   Urinalysis, Routine w reflex microscopic     Status: Abnormal   Collection Time: 04/02/19  7:57 PM  Result Value Ref Range   Color, Urine YELLOW YELLOW   APPearance CLEAR CLEAR   Specific Gravity, Urine 1.006 1.005 - 1.030   pH 6.0 5.0 - 8.0   Glucose, UA NEGATIVE NEGATIVE  mg/dL   Hgb urine dipstick NEGATIVE NEGATIVE   Bilirubin Urine NEGATIVE NEGATIVE   Ketones, ur 20 (A) NEGATIVE mg/dL   Protein, ur NEGATIVE NEGATIVE mg/dL   Nitrite NEGATIVE NEGATIVE   Leukocytes,Ua TRACE (A) NEGATIVE   RBC / HPF 0-5 0 - 5 RBC/hpf   WBC, UA 6-10 0 - 5 WBC/hpf   Bacteria, UA NONE SEEN NONE SEEN   Squamous Epithelial / LPF 0-5 0 - 5   Mucus PRESENT     Comment: Performed at [facility], [place] [address], [place], [place] [address]  Blood gas, venous     Status: Abnormal   Collection Time: 04/02/19  7:58 PM  Result Value Ref Range   O2 Content 2.0  L/min   Delivery systems NASAL CANNULA    pH, Ven 7.326 7.250 - 7.430   pCO2, Ven 74.0 (HH) 44.0 - 60.0 mmHg    Comment: CRITICAL RESULT CALLED TO, READ BACK BY AND VERIFIED WITH: Natasha Bence, RN AT 2030 BY Ned Grace RRT, RCP ON 04/02/2019    pO2, Ven 103.0 (H) 32.0 - 45.0 mmHg   Bicarbonate 37.6 (H) 20.0 - 28.0 mmol/L   Acid-Base Excess 9.3 (H) 0.0 - 2.0 mmol/L   O2 Saturation 97.4 %   Patient temperature 98.6    Collection site VEIN    Drawn by DRAWN BY RN    Sample type VENOUS     Comment: Performed at [facility], [address] [place], [place] [address]    Chemistries  Recent Labs  Lab 04/02/19 1923  NA 143  K 3.2*  CL 98  CO2 31  GLUCOSE 87  BUN 7  CREATININE 0.70  CALCIUM 8.8*  AST 25  ALT 15  ALKPHOS 85  BILITOT 0.2*   ------------------------------------------------------------------------------------------------------------------  ------------------------------------------------------------------------------------------------------------------ GFR: CrCl cannot be calculated (Unknown ideal weight.). Liver Function Tests: Recent Labs  Lab 04/02/19 1923  AST 25  ALT 15  ALKPHOS 85  BILITOT 0.2*  PROT 6.3*  ALBUMIN 3.5   No results for input(s): LIPASE, AMYLASE in the last 168 hours. No results for input(s): AMMONIA in the last 168  hours. Coagulation Profile: No results for input(s): INR, PROTIME in the last 168 hours. Cardiac Enzymes: No results for input(s): CKTOTAL, CKMB, CKMBINDEX, TROPONINI in the last 168 hours. BNP (last 3 results) No results for input(s): PROBNP in the last 8760 hours. HbA1C: No results for input(s): HGBA1C in the last 72 hours. CBG: No results for input(s): GLUCAP in the last 168 hours. Lipid Profile: No results for input(s): CHOL, HDL, LDLCALC, TRIG, CHOLHDL, LDLDIRECT in the last 72 hours. Thyroid Function Tests: No results for input(s): TSH, T4TOTAL, FREET4, T3FREE, THYROIDAB in the last 72 hours. Anemia Panel: No results for input(s): VITAMINB12, FOLATE, FERRITIN, TIBC, IRON, RETICCTPCT in the last 72 hours.  --------------------------------------------------------------------------------------------------------------- Urine analysis:    Component Value Date/Time   COLORURINE YELLOW 04/02/2019 1957   APPEARANCEUR CLEAR 04/02/2019 1957   LABSPEC 1.006 04/02/2019 1957   PHURINE 6.0 04/02/2019 1957   GLUCOSEU NEGATIVE 04/02/2019 1957   HGBUR NEGATIVE 04/02/2019 1957   BILIRUBINUR NEGATIVE 04/02/2019 1957   KETONESUR 20 (A) 04/02/2019 1957   PROTEINUR NEGATIVE 04/02/2019 1957   UROBILINOGEN 0.2 12/27/2012 0454   NITRITE NEGATIVE 04/02/2019 1957   LEUKOCYTESUR TRACE (A) 04/02/2019 1957      Imaging Results:    Dg Chest Port 1 View  Result Date: 04/02/2019 CLINICAL DATA:  Emesis and fever EXAM: PORTABLE CHEST 1 VIEW COMPARISON:  June 15, 2018 FINDINGS: The heart size and mediastinal contours are within normal limits. There is hyperinflation of the lung zones. Surgical sutures the left upper lung. The visualized skeletal structures are unremarkable. IMPRESSION: No acute cardiopulmonary process. Electronically Signed   By: Prudencio Pair M.D.   On: 04/02/2019 22:10       Assessment & Plan:    Principal Problem:   Acute respiratory failure with hypoxia and hypercapnia  (HCC) Active Problems:   Hypokalemia   COPD exacerbation (HCC)   Essential hypertension  Acute respiratory failure with hypoxia and hypercapnia Secondary to Copd exacerbation Solumedrol 80mg  iv q8h Rocephin 1gm iv qday Cont Symbicort -> Dulera 2puff bid Cont spiriva-> incruse 1puff qday Albuterol HFA 2puff qid and  q6h prn  Bipap overnite Check cbc in am  Nausea / vomitting Zofran 4mg  iv q6h prn  Consider GI consultation in am  Hypokalemia Replete Check cmp in am  Hypertension Cont Amlodipine 2.5mg  po qday  Pancreatic insufficiency secondary to h/o gastrinoma s/p whipple Cont Creon  Jerrye Bushy, h/o esophageal stricture Cont PPI  Insomnia Cont Elavil  Cont Remeron  Acute lower UTI abx as above  Nicotine dep Pt counselled on smoking cessation x 3 minutes  Neuropathy Cont Lyrica  DVT Prophylaxis-   Lovenox - SCDs  AM Labs Ordered, also please review Full Orders  Family Communication: Admission, patients condition and plan of care including tests being ordered have been discussed with the patient who indicate understanding and agree with the plan and Code Status.  Code Status:  FULL CODE per patient, notified [name] her daughter that patient admitted   Admission status: Inpatient: Based on patients clinical presentation and evaluation of above clinical data, I have made determination that patient meets Inpatient criteria at this time.  Pt has acute respiratory failure, hypoxic and hypercapneic respiratory failure secondary to copd exacerbation, will require iv solumedrol and abx, and close monitoring of Co2, and bipap tonight. Pt will require > 2nite days stay.    Time spent in minutes : 70   Jani Gravel M.D on 04/02/2019 at 10:35 PM

## 2019-04-03 ENCOUNTER — Inpatient Hospital Stay (HOSPITAL_COMMUNITY): Payer: No Typology Code available for payment source

## 2019-04-03 DIAGNOSIS — R112 Nausea with vomiting, unspecified: Secondary | ICD-10-CM | POA: Diagnosis present

## 2019-04-03 DIAGNOSIS — I10 Essential (primary) hypertension: Secondary | ICD-10-CM

## 2019-04-03 DIAGNOSIS — J9622 Acute and chronic respiratory failure with hypercapnia: Secondary | ICD-10-CM

## 2019-04-03 DIAGNOSIS — R109 Unspecified abdominal pain: Secondary | ICD-10-CM | POA: Diagnosis present

## 2019-04-03 DIAGNOSIS — R8290 Unspecified abnormal findings in urine: Secondary | ICD-10-CM | POA: Diagnosis present

## 2019-04-03 DIAGNOSIS — J441 Chronic obstructive pulmonary disease with (acute) exacerbation: Secondary | ICD-10-CM

## 2019-04-03 LAB — CBC
HCT: 44.7 % (ref 36.0–46.0)
Hemoglobin: 13.8 g/dL (ref 12.0–15.0)
MCH: 27.4 pg (ref 26.0–34.0)
MCHC: 30.9 g/dL (ref 30.0–36.0)
MCV: 88.9 fL (ref 80.0–100.0)
Platelets: 291 10*3/uL (ref 150–400)
RBC: 5.03 MIL/uL (ref 3.87–5.11)
RDW: 16.5 % — ABNORMAL HIGH (ref 11.5–15.5)
WBC: 8.8 10*3/uL (ref 4.0–10.5)
nRBC: 0 % (ref 0.0–0.2)

## 2019-04-03 LAB — COMPREHENSIVE METABOLIC PANEL
ALT: 16 U/L (ref 0–44)
AST: 23 U/L (ref 15–41)
Albumin: 3.5 g/dL (ref 3.5–5.0)
Alkaline Phosphatase: 87 U/L (ref 38–126)
Anion gap: 12 (ref 5–15)
BUN: 7 mg/dL (ref 6–20)
CO2: 33 mmol/L — ABNORMAL HIGH (ref 22–32)
Calcium: 8.6 mg/dL — ABNORMAL LOW (ref 8.9–10.3)
Chloride: 96 mmol/L — ABNORMAL LOW (ref 98–111)
Creatinine, Ser: 0.52 mg/dL (ref 0.44–1.00)
GFR calc Af Amer: >60 mL/min (ref >60)
GFR calc non Af Amer: >60 mL/min (ref >60)
Glucose, Bld: 108 mg/dL — ABNORMAL HIGH (ref 70–99)
Potassium: 3.3 mmol/L — ABNORMAL LOW (ref 3.5–5.1)
Sodium: 141 mmol/L (ref 135–145)
Total Bilirubin: 0.6 mg/dL (ref 0.3–1.2)
Total Protein: 6.6 g/dL (ref 6.5–8.1)

## 2019-04-03 LAB — MAGNESIUM: Magnesium: 2.3 mg/dL (ref 1.7–2.4)

## 2019-04-03 LAB — TROPONIN I (HIGH SENSITIVITY): Troponin I (High Sensitivity): 4 ng/L (ref <18)

## 2019-04-03 LAB — LIPASE, BLOOD: Lipase: 21 U/L (ref 11–51)

## 2019-04-03 MED ORDER — POTASSIUM CHLORIDE 10 MEQ/100ML IV SOLN
10.0000 meq | INTRAVENOUS | Status: AC
  Administered 2019-04-03 (×2): 10 meq via INTRAVENOUS
  Filled 2019-04-03 (×2): qty 100

## 2019-04-03 MED ORDER — INFLUENZA VAC SPLIT QUAD 0.5 ML IM SUSY
0.5000 mL | PREFILLED_SYRINGE | INTRAMUSCULAR | Status: AC
  Administered 2019-04-06: 0.5 mL via INTRAMUSCULAR
  Filled 2019-04-03 (×2): qty 0.5

## 2019-04-03 MED ORDER — ENSURE ENLIVE PO LIQD
237.0000 mL | Freq: Three times a day (TID) | ORAL | Status: DC
  Administered 2019-04-03 – 2019-04-07 (×12): 237 mL via ORAL
  Filled 2019-04-03 (×2): qty 237

## 2019-04-03 MED ORDER — SENNOSIDES-DOCUSATE SODIUM 8.6-50 MG PO TABS
2.0000 | ORAL_TABLET | Freq: Two times a day (BID) | ORAL | Status: DC
  Administered 2019-04-03 – 2019-04-04 (×2): 2 via ORAL
  Filled 2019-04-03 (×3): qty 2

## 2019-04-03 MED ORDER — MIRTAZAPINE 15 MG PO TABS
15.0000 mg | ORAL_TABLET | Freq: Every day | ORAL | Status: DC
  Administered 2019-04-03 – 2019-04-06 (×4): 15 mg via ORAL
  Filled 2019-04-03 (×4): qty 1

## 2019-04-03 MED ORDER — CHOLECALCIFEROL 10 MCG (400 UNIT) PO TABS
800.0000 [IU] | ORAL_TABLET | Freq: Every day | ORAL | Status: DC
  Administered 2019-04-03 – 2019-04-07 (×5): 800 [IU] via ORAL
  Filled 2019-04-03 (×5): qty 2

## 2019-04-03 MED ORDER — ADULT MULTIVITAMIN W/MINERALS CH
1.0000 | ORAL_TABLET | Freq: Every day | ORAL | Status: DC
  Administered 2019-04-03 – 2019-04-07 (×5): 1 via ORAL
  Filled 2019-04-03 (×5): qty 1

## 2019-04-03 MED ORDER — TIOTROPIUM BROMIDE MONOHYDRATE 18 MCG IN CAPS
18.0000 ug | ORAL_CAPSULE | Freq: Every day | RESPIRATORY_TRACT | Status: DC
  Filled 2019-04-03: qty 5

## 2019-04-03 MED ORDER — PREGABALIN 75 MG PO CAPS
150.0000 mg | ORAL_CAPSULE | Freq: Two times a day (BID) | ORAL | Status: DC
  Administered 2019-04-03 – 2019-04-07 (×9): 150 mg via ORAL
  Filled 2019-04-03 (×9): qty 2

## 2019-04-03 MED ORDER — ALBUTEROL SULFATE HFA 108 (90 BASE) MCG/ACT IN AERS
2.0000 | INHALATION_SPRAY | Freq: Four times a day (QID) | RESPIRATORY_TRACT | Status: DC | PRN
  Filled 2019-04-03: qty 6.7

## 2019-04-03 MED ORDER — POTASSIUM CHLORIDE 20 MEQ PO PACK
40.0000 meq | PACK | ORAL | Status: AC
  Administered 2019-04-03: 40 meq via ORAL
  Filled 2019-04-03 (×2): qty 2

## 2019-04-03 MED ORDER — PANTOPRAZOLE SODIUM 40 MG PO TBEC
40.0000 mg | DELAYED_RELEASE_TABLET | Freq: Two times a day (BID) | ORAL | Status: DC
  Administered 2019-04-03 – 2019-04-07 (×8): 40 mg via ORAL
  Filled 2019-04-03 (×8): qty 1

## 2019-04-03 MED ORDER — GUAIFENESIN ER 600 MG PO TB12
600.0000 mg | ORAL_TABLET | Freq: Two times a day (BID) | ORAL | Status: DC
  Administered 2019-04-03 – 2019-04-07 (×9): 600 mg via ORAL
  Filled 2019-04-03 (×9): qty 1

## 2019-04-03 MED ORDER — ALBUTEROL SULFATE (2.5 MG/3ML) 0.083% IN NEBU: 3.0000 mL | INHALATION_SOLUTION | Freq: Four times a day (QID) | RESPIRATORY_TRACT | Status: DC | PRN

## 2019-04-03 MED ORDER — POLYETHYLENE GLYCOL 3350 17 G PO PACK: 17.0000 g | PACK | Freq: Every day | ORAL | Status: DC | PRN

## 2019-04-03 MED ORDER — PANCRELIPASE (LIP-PROT-AMYL) 12000-38000 UNITS PO CPEP
24000.0000 [IU] | ORAL_CAPSULE | Freq: Two times a day (BID) | ORAL | Status: DC
  Administered 2019-04-03 – 2019-04-07 (×9): 24000 [IU] via ORAL
  Filled 2019-04-03 (×9): qty 2

## 2019-04-03 MED ORDER — AMLODIPINE BESYLATE 5 MG PO TABS
2.5000 mg | ORAL_TABLET | Freq: Every day | ORAL | Status: DC
  Administered 2019-04-03 – 2019-04-07 (×4): 2.5 mg via ORAL
  Filled 2019-04-03 (×5): qty 1

## 2019-04-03 MED ORDER — TIZANIDINE HCL 2 MG PO TABS
2.0000 mg | ORAL_TABLET | Freq: Every day | ORAL | Status: DC
  Administered 2019-04-03 – 2019-04-06 (×4): 2 mg via ORAL
  Filled 2019-04-03 (×5): qty 1

## 2019-04-03 MED ORDER — ALBUTEROL SULFATE (2.5 MG/3ML) 0.083% IN NEBU: 2.5000 mg | INHALATION_SOLUTION | Freq: Four times a day (QID) | RESPIRATORY_TRACT | Status: DC | PRN

## 2019-04-03 MED ORDER — PANTOPRAZOLE SODIUM 40 MG PO TBEC
40.0000 mg | DELAYED_RELEASE_TABLET | Freq: Every day | ORAL | Status: DC
  Administered 2019-04-03: 40 mg via ORAL
  Filled 2019-04-03 (×2): qty 1

## 2019-04-03 MED ORDER — AMITRIPTYLINE HCL 10 MG PO TABS
10.0000 mg | ORAL_TABLET | Freq: Every day | ORAL | Status: DC
  Administered 2019-04-03 – 2019-04-06 (×4): 10 mg via ORAL
  Filled 2019-04-03 (×5): qty 1

## 2019-04-03 MED ORDER — MOMETASONE FURO-FORMOTEROL FUM 200-5 MCG/ACT IN AERO
2.0000 | INHALATION_SPRAY | Freq: Two times a day (BID) | RESPIRATORY_TRACT | Status: DC
  Filled 2019-04-03: qty 8.8

## 2019-04-03 MED ORDER — IPRATROPIUM-ALBUTEROL 0.5-2.5 (3) MG/3ML IN SOLN
3.0000 mL | Freq: Three times a day (TID) | RESPIRATORY_TRACT | Status: DC
  Administered 2019-04-03 – 2019-04-05 (×4): 3 mL via RESPIRATORY_TRACT
  Filled 2019-04-03 (×4): qty 3

## 2019-04-03 MED ORDER — IPRATROPIUM-ALBUTEROL 0.5-2.5 (3) MG/3ML IN SOLN
3.0000 mL | Freq: Four times a day (QID) | RESPIRATORY_TRACT | Status: DC
  Filled 2019-04-03: qty 3

## 2019-04-03 NOTE — Progress Notes (Signed)
[facility]  PROGRESS NOTE  Markella Lamphier [id number] DOB: [birth year] DOA: 04/02/2019 PCP: System, Pcp Not In  Brief History    Tkai Tiley is a 63 y.o. year old female with medical history significant for h/o lung cancer s/p resection, h/o gastrinoma s/p whipple, esophageal stricture, pancreatic insufficiency, hypertension, Copd (not on home o2 who presented on 04/02/2019 with worsening shortness of breath, worsening abdominal pain, decreased appetite in setting of nausea and vomiting, slightly productive cough and was found to have acute hypoxic hypercarbic respiratory failure secondary to COPD exacerbation and nausea vomiting of unclear etiology.  ED course: Afebrile, heart rate 113, respiratory rate 20, BP 125/87 pulse ox 80% on room air requiring 2 L.  Chest x-ray negative for acute abnormalities.  Lab work notable for unremarkable CBC and CMP.  UA with WBC 5-10, negative nitrates, no bacteria, COVID-19 negative.  VBG pH 7.32, CO2 74.  Patient was admitted for acute hypoxic hypercapnic respiratory failure secondary to COPD exacerbation.  Patient declined BiPAP in the ED.  A & P     Acute hypoxic/hypercarbic respiratory failure secondary to COPD exacerbation.  Worsening dyspnea, worsening cough, new O2 requirements (intermittently uses O2 at home, currently on 2 L after desaturation of 83% on room air in the ED).  Chest x-ray unremarkable.  Consistent with COPD flare.  Continue scheduled duo nebs, flutter valve, incentive spirometry, IV Solu-Medrol, scheduled Mucinex.  Patient declined BiPAP, does have significant hypercarbia on VBG in the 70s however maintaining normal mental status and stable respiratory status on current medication regimen   Hypertension, stable.  Continue home amlodipine.   Pancreatic insufficiency seoncdary to History of gastrinoma, s/p whipple stable.  Status post pancreatic and duodenal resection in 2015 [facility] for gastrum) continue home Creon.   Nausea  and vomiting with abdominal pain.  Presume secondary to constipation.  Abdominal x-ray shows no acute abnormalities.  Closely monitor.  Lipase and LFTs wnl. Continue Protonix BID. Optimize bowel regimen.  PRN antiemetics.  If does not improve with supportive care will consider GI consultation given notable GI history   History of esophageal stenosis/low-grade esophagitis (EGD 01/2018).  Continue PPI.  Not complaining currently of dysphagia closely monitor   Insomnia, stable.  Continue Elavil   Reported acute UTI.  Patient denies any urinary symptoms.  UA only shows slight leukocytes.  Will discontinue ceftriaxone and closely monitor.     DVT prophylaxis: Lovenox Code Status: Full code Family Communication: None at bedside Disposition Plan: Needs continued inpatient stay given oxygen requirements, active COPD exacerbation    [facility] Direct contact: see [email or web address] (further directions at bottom of note if needed) 7PM-7AM contact night coverage as at bottom of note 04/03/2019, 5:56 PM  LOS: 1 day   Consultants  . None  Procedures  . BiPAP attempted on admission, patient declined  Antibiotics  . None  Interval History/Subjective  Has not had a bowel movement about a week This breathing is somewhat better  Objective   Vitals:  Vitals:   04/03/19 1436 04/03/19 1751  BP: 103/77   Pulse: 100   Resp: 12   Temp: 98.6 F (37 C)   SpO2: 97% 98%    Exam:  Awake Alert, Oriented X 3, No new F.N deficits, Normal affect [place].AT, Symmetrical Chest wall movement, scant wheezing, decreased air movement, no respiratory accessory muscle usage, no conversational dyspnea, on 2 L nasal cannula RRR,No Gallops,Rubs or new Murmurs,  +ve B.Sounds, Abd Soft, slightly distended, slightly tender in lower quadrants with palpation,  No rebound - guarding or rigidity. No Cyanosis, Clubbing or edema, No new Rash or bruise     I have personally reviewed the following:   Data Reviewed:  Basic Metabolic Panel: Recent Labs  Lab 04/02/19 1923 04/03/19 0230  NA 143 141  K 3.2* 3.3*  CL 98 96*  CO2 31 33*  GLUCOSE 87 108*  BUN 7 7  CREATININE 0.70 0.52  CALCIUM 8.8* 8.6*  MG  --  2.3   Liver Function Tests: Recent Labs  Lab 04/02/19 1923 04/03/19 0230  AST 25 23  ALT 15 16  ALKPHOS 85 87  BILITOT 0.2* 0.6  PROT 6.3* 6.6  ALBUMIN 3.5 3.5   Recent Labs  Lab 04/03/19 0230  LIPASE 21   No results for input(s): AMMONIA in the last 168 hours. CBC: Recent Labs  Lab 04/02/19 1923 04/03/19 0230  WBC 10.8* 8.8  NEUTROABS 5.3  --   HGB 13.5 13.8  HCT 43.2 44.7  MCV 88.9 88.9  PLT 265 291   Cardiac Enzymes: No results for input(s): CKTOTAL, CKMB, CKMBINDEX, TROPONINI in the last 168 hours. BNP (last 3 results) Recent Labs    06/15/18 1725  BNP 64.1    ProBNP (last 3 results) No results for input(s): PROBNP in the last 8760 hours.  CBG: No results for input(s): GLUCAP in the last 168 hours.  Recent Results (from the past 240 hour(s))  SARS Coronavirus 2 [facility] order, Performed in [facility] hospital lab) Nasopharyngeal Nasopharyngeal Swab     Status: None   Collection Time: 04/02/19  7:39 PM   Specimen: Nasopharyngeal Swab  Result Value Ref Range Status   SARS Coronavirus 2 NEGATIVE NEGATIVE Final    Comment: (NOTE) If result is NEGATIVE SARS-CoV-2 target nucleic acids are NOT DETECTED. The SARS-CoV-2 RNA is generally detectable in upper and lower  respiratory specimens during the acute phase of infection. The lowest  concentration of SARS-CoV-2 viral copies this assay can detect is 250  copies / mL. A negative result does not preclude SARS-CoV-2 infection  and should not be used as the sole basis for treatment or other  patient management decisions.  A negative result may occur with  improper specimen collection / handling, submission of specimen other  than nasopharyngeal swab, presence of viral mutation(s) within the  areas targeted  by this assay, and inadequate number of viral copies  (<250 copies / mL). A negative result must be combined with clinical  observations, patient history, and epidemiological information. If result is POSITIVE SARS-CoV-2 target nucleic acids are DETECTED. The SARS-CoV-2 RNA is generally detectable in upper and lower  respiratory specimens dur ing the acute phase of infection.  Positive  results are indicative of active infection with SARS-CoV-2.  Clinical  correlation with patient history and other diagnostic information is  necessary to determine patient infection status.  Positive results do  not rule out bacterial infection or co-infection with other viruses. If result is PRESUMPTIVE POSTIVE SARS-CoV-2 nucleic acids MAY BE PRESENT.   A presumptive positive result was obtained on the submitted specimen  and confirmed on repeat testing.  While 2019 novel coronavirus  (SARS-CoV-2) nucleic acids may be present in the submitted sample  additional confirmatory testing may be necessary for epidemiological  and / or clinical management purposes  to differentiate between  SARS-CoV-2 and other Sarbecovirus currently known to infect humans.  If clinically indicated additional testing with an alternate test  methodology [phone]) is advised. The SARS-CoV-2 RNA is generally  detectable in upper and lower respiratory sp ecimens during the acute  phase of infection. The expected result is Negative. Fact Sheet for Patients:  StrictlyIdeas.no Fact Sheet for Healthcare Providers: BankingDealers.co.za This test is not yet approved or cleared by the [place] FDA and has been authorized for detection and/or diagnosis of SARS-CoV-2 by FDA under an Emergency Use Authorization (EUA).  This EUA will remain in effect (meaning this test can be used) for the duration of the COVID-19 declaration under Section 564(b)(1) of the Act, 21 U.S.C. section  360bbb-3(b)(1), unless the authorization is terminated or revoked sooner. Performed at [facility], [place] [address] [place], [place] [address]      Studies: Dg Chest Port 1 View  Result Date: 04/02/2019 CLINICAL DATA:  Emesis and fever EXAM: PORTABLE CHEST 1 VIEW COMPARISON:  June 15, 2018 FINDINGS: The heart size and mediastinal contours are within normal limits. There is hyperinflation of the lung zones. Surgical sutures the left upper lung. The visualized skeletal structures are unremarkable. IMPRESSION: No acute cardiopulmonary process. Electronically Signed   By: Prudencio Pair M.D.   On: 04/02/2019 22:10   Dg Abd Portable 1v  Result Date: 04/03/2019 CLINICAL DATA:  Pt c/o dyspnea for the past several days and n/v. Medical hx of lung cancer, s/p resection, h/o gastrinoma s/p whipple, esophageal stricture, pancreatic insufficiency, hypertension and COPD. EXAM: PORTABLE ABDOMEN - 1 VIEW COMPARISON:  CT 12/27/2012 and earlier studies FINDINGS: The bowel gas pattern is normal. Metallic clips in the right upper and right lower abdomen. Visualized lung bases clear. Bilateral pelvic phleboliths. Regional bones unremarkable. IMPRESSION: Normal bowel gas pattern. Electronically Signed   By: Lucrezia Europe M.D.   On: 04/03/2019 10:43    Scheduled Meds: . amitriptyline  10 mg Oral QHS  . amLODipine  2.5 mg Oral Daily  . cholecalciferol  800 Units Oral Daily  . enoxaparin (LOVENOX) injection  40 mg Subcutaneous Q24H  . feeding supplement (ENSURE ENLIVE)  237 mL Oral TID BM  . guaiFENesin  600 mg Oral BID  . [START ON 04/04/2019] influenza vac split quadrivalent PF  0.5 mL Intramuscular Tomorrow-1000  . ipratropium-albuterol  3 mL Nebulization TID  . lipase/protease/amylase  24,000 Units Oral BID WC  . methylPREDNISolone (SOLU-MEDROL) injection  80 mg Intravenous Q8H  . mirtazapine  15 mg Oral QHS  . multivitamin with minerals  1 tablet Oral Daily  . pantoprazole  40 mg Oral  Daily  . pregabalin  150 mg Oral BID  . tiZANidine  2 mg Oral QHS   Continuous Infusions: . sodium chloride      Principal Problem:   Acute respiratory failure with hypoxia and hypercapnia (HCC) Active Problems:   Hypokalemia   COPD exacerbation (HCC)   Essential hypertension      Desiree Hane  [facility]

## 2019-04-03 NOTE — Progress Notes (Signed)
Attempted to placed pt. on BiPAP/V-60 per order, pt. unable to tolerate, RN made aware.

## 2019-04-03 NOTE — Progress Notes (Signed)
Report received from ED 

## 2019-04-04 DIAGNOSIS — R0902 Hypoxemia: Secondary | ICD-10-CM

## 2019-04-04 DIAGNOSIS — E876 Hypokalemia: Secondary | ICD-10-CM

## 2019-04-04 LAB — BASIC METABOLIC PANEL
Anion gap: 8 (ref 5–15)
BUN: 19 mg/dL (ref 8–23)
CO2: 35 mmol/L — ABNORMAL HIGH (ref 22–32)
Calcium: 8.9 mg/dL (ref 8.9–10.3)
Chloride: 98 mmol/L (ref 98–111)
Creatinine, Ser: 0.51 mg/dL (ref 0.44–1.00)
GFR calc Af Amer: >60 mL/min (ref >60)
GFR calc non Af Amer: >60 mL/min (ref >60)
Glucose, Bld: 125 mg/dL — ABNORMAL HIGH (ref 70–99)
Potassium: 4.1 mmol/L (ref 3.5–5.1)
Sodium: 141 mmol/L (ref 135–145)

## 2019-04-04 MED ORDER — SODIUM CHLORIDE 0.9% FLUSH
3.0000 mL | Freq: Two times a day (BID) | INTRAVENOUS | Status: DC
  Administered 2019-04-04 – 2019-04-07 (×6): 3 mL via INTRAVENOUS

## 2019-04-04 NOTE — Progress Notes (Signed)
[facility]  PROGRESS NOTE  Jamel Bumstead [id number] DOB: Jun 10, 1956 DOA: 04/02/2019 PCP: System, Pcp Not In  Brief History    Sophira Glatz is a 63 y.o. year old female with medical history significant for h/o lung cancer s/p resection, h/o gastrinoma s/p whipple, esophageal stricture, pancreatic insufficiency, hypertension, Copd (not on home o2 who presented on 04/02/2019 with worsening shortness of breath, worsening abdominal pain, decreased appetite in setting of nausea and vomiting, slightly productive cough and was found to have acute hypoxic hypercarbic respiratory failure secondary to COPD exacerbation and nausea vomiting of unclear etiology.  ED course: Afebrile, heart rate 113, respiratory rate 20, BP 125/87 pulse ox 80% on room air requiring 2 L.  Chest x-ray negative for acute abnormalities.  Lab work notable for unremarkable CBC and CMP.  UA with WBC 5-10, negative nitrates, no bacteria, COVID-19 negative.  VBG pH 7.32, CO2 74.  Patient was admitted for acute hypoxic hypercapnic respiratory failure secondary to COPD exacerbation.  Patient declined BiPAP in the ED.  A & P     Acute hypoxic/hypercarbic respiratory failure secondary to COPD exacerbation.  Worsening dyspnea, worsening cough, new O2 requirements (intermittently uses O2 at home, currently on 2 L after desaturation of 83% on room air in the ED).  Chest x-ray unremarkable.  Consistent with COPD flare.  Still wheezing with notable increased work of breathing and respiratory effort with minimal exertion (ambulating from bathroom to bed), not consistent with baseline, will continue inpatient stay with  scheduled duo nebs, flutter valve, incentive spirometry, IV Solu-Medrol, scheduled Mucinex.  Patient declined BiPAP in the ED, does have significant hypercarbia on VBG in the 70s however maintaining normal mental status and stable respiratory status on current medication regimen    Hypokalemia mag within normal limits.   Likely due to nausea vomiting on admission.  No recurrent episodes here.  Status post repletion, closely monitor BMP   Hypertension, stable.  Continue home amlodipine.   Pancreatic insufficiency secondary to History of gastrinoma, s/p whipple stable.  Status post pancreatic and duodenal resection in 2015 [facility] for gastrum) continue home Creon.   Nausea and vomiting with abdominal pain.  Stable.  On x-ray shows no constipation/obstruction or other abnormalities.  Having BMs.  Admits diminished appetite but tolerating home ensures, does not like food here.    Closely monitor.  Lipase and LFTs wnl. Continue Protonix BID. O If does not improve with supportive care will consider GI consultation given notable GI history   History of esophageal stenosis/low-grade esophagitis (EGD 01/2018).  Continue PPI.  Not complaining currently of dysphagia closely monitor   Insomnia, stable.  Continue Elavil   Abnormal UA  Patient denies any urinary symptoms.  UA only shows slight leukocytes.  Will discontinue ceftriaxone and closely monitor.     DVT prophylaxis: Lovenox Code Status: Full code Family Communication: None at bedside, will update daughter [name] on phone Disposition Plan: Needs continued inpatient stay given oxygen requirements, active COPD exacerbation with persistent respiratory effort requiring IV steroids and scheduled inhalers    [facility] Direct contact: see [email or web address] (further directions at bottom of note if needed) 7PM-7AM contact night coverage as at bottom of note 04/04/2019, 12:45 PM  LOS: 2 days   Consultants  . None  Procedures  . BiPAP attempted on admission, patient declined  Antibiotics  . None  Interval History/Subjective  Still feels easily short of breath with minimal exertion  Objective   Vitals:  Vitals:   04/04/19 1100 04/04/19 1105  BP: 112/66 112/66  Pulse:    Resp:    Temp:    SpO2:      Exam:  Awake Alert, Oriented X 3, No new  F.N deficits, Normal affect [place].AT, Symmetrical Chest wall movement, wheezing most notable at bases, decreased air movement, n abdominal muscle usage, conversational dyspnea present, currently on 2 L nasal cannula. RRR,No Gallops,Rubs or new Murmurs,  +ve B.Sounds, Abd Soft, nontender, No rebound - guarding or rigidity. No Cyanosis, Clubbing or edema, No new Rash or bruise     I have personally reviewed the following:   Data Reviewed: Basic Metabolic Panel: Recent Labs  Lab 04/02/19 1923 04/03/19 0230  NA 143 141  K 3.2* 3.3*  CL 98 96*  CO2 31 33*  GLUCOSE 87 108*  BUN 7 7  CREATININE 0.70 0.52  CALCIUM 8.8* 8.6*  MG  --  2.3   Liver Function Tests: Recent Labs  Lab 04/02/19 1923 04/03/19 0230  AST 25 23  ALT 15 16  ALKPHOS 85 87  BILITOT 0.2* 0.6  PROT 6.3* 6.6  ALBUMIN 3.5 3.5   Recent Labs  Lab 04/03/19 0230  LIPASE 21   No results for input(s): AMMONIA in the last 168 hours. CBC: Recent Labs  Lab 04/02/19 1923 04/03/19 0230  WBC 10.8* 8.8  NEUTROABS 5.3  --   HGB 13.5 13.8  HCT 43.2 44.7  MCV 88.9 88.9  PLT 265 291   Cardiac Enzymes: No results for input(s): CKTOTAL, CKMB, CKMBINDEX, TROPONINI in the last 168 hours. BNP (last 3 results) Recent Labs    06/15/18 1725  BNP 64.1    ProBNP (last 3 results) No results for input(s): PROBNP in the last 8760 hours.  CBG: No results for input(s): GLUCAP in the last 168 hours.  Recent Results (from the past 240 hour(s))  SARS Coronavirus 2 [facility] order, Performed in [facility] hospital lab) Nasopharyngeal Nasopharyngeal Swab     Status: None   Collection Time: 04/02/19  7:39 PM   Specimen: Nasopharyngeal Swab  Result Value Ref Range Status   SARS Coronavirus 2 NEGATIVE NEGATIVE Final    Comment: (NOTE) If result is NEGATIVE SARS-CoV-2 target nucleic acids are NOT DETECTED. The SARS-CoV-2 RNA is generally detectable in upper and lower  respiratory specimens during the acute phase of  infection. The lowest  concentration of SARS-CoV-2 viral copies this assay can detect is 250  copies / mL. A negative result does not preclude SARS-CoV-2 infection  and should not be used as the sole basis for treatment or other  patient management decisions.  A negative result may occur with  improper specimen collection / handling, submission of specimen other  than nasopharyngeal swab, presence of viral mutation(s) within the  areas targeted by this assay, and inadequate number of viral copies  (<250 copies / mL). A negative result must be combined with clinical  observations, patient history, and epidemiological information. If result is POSITIVE SARS-CoV-2 target nucleic acids are DETECTED. The SARS-CoV-2 RNA is generally detectable in upper and lower  respiratory specimens dur ing the acute phase of infection.  Positive  results are indicative of active infection with SARS-CoV-2.  Clinical  correlation with patient history and other diagnostic information is  necessary to determine patient infection status.  Positive results do  not rule out bacterial infection or co-infection with other viruses. If result is PRESUMPTIVE POSTIVE SARS-CoV-2 nucleic acids MAY BE PRESENT.   A presumptive positive result was obtained on the submitted specimen  and confirmed on repeat testing.  While 2019 novel coronavirus  (SARS-CoV-2) nucleic acids may be present in the submitted sample  additional confirmatory testing may be necessary for epidemiological  and / or clinical management purposes  to differentiate between  SARS-CoV-2 and other Sarbecovirus currently known to infect humans.  If clinically indicated additional testing with an alternate test  methodology [phone] is advised. The SARS-CoV-2 RNA is generally  detectable in upper and lower respiratory sp ecimens during the acute  phase of infection. The expected result is Negative. Fact Sheet for Patients:   StrictlyIdeas.no Fact Sheet for Healthcare Providers: BankingDealers.co.za This test is not yet approved or cleared by the [place] FDA and has been authorized for detection and/or diagnosis of SARS-CoV-2 by FDA under an Emergency Use Authorization (EUA).  This EUA will remain in effect (meaning this test can be used) for the duration of the COVID-19 declaration under Section 564(b)(1) of the Act, 21 U.S.C. section 360bbb-3(b)(1), unless the authorization is terminated or revoked sooner. Performed at [facility], [place] [address] [place], [place] [address]      Studies: Dg Chest Port 1 View  Result Date: 04/02/2019 CLINICAL DATA:  Emesis and fever EXAM: PORTABLE CHEST 1 VIEW COMPARISON:  June 15, 2018 FINDINGS: The heart size and mediastinal contours are within normal limits. There is hyperinflation of the lung zones. Surgical sutures the left upper lung. The visualized skeletal structures are unremarkable. IMPRESSION: No acute cardiopulmonary process. Electronically Signed   By: Prudencio Pair M.D.   On: 04/02/2019 22:10   Dg Abd Portable 1v  Result Date: 04/03/2019 CLINICAL DATA:  Pt c/o dyspnea for the past several days and n/v. Medical hx of lung cancer, s/p resection, h/o gastrinoma s/p whipple, esophageal stricture, pancreatic insufficiency, hypertension and COPD. EXAM: PORTABLE ABDOMEN - 1 VIEW COMPARISON:  CT 12/27/2012 and earlier studies FINDINGS: The bowel gas pattern is normal. Metallic clips in the right upper and right lower abdomen. Visualized lung bases clear. Bilateral pelvic phleboliths. Regional bones unremarkable. IMPRESSION: Normal bowel gas pattern. Electronically Signed   By: Lucrezia Europe M.D.   On: 04/03/2019 10:43    Scheduled Meds: . amitriptyline  10 mg Oral QHS  . amLODipine  2.5 mg Oral Daily  . cholecalciferol  800 Units Oral Daily  . enoxaparin (LOVENOX) injection  40 mg Subcutaneous Q24H   . feeding supplement (ENSURE ENLIVE)  237 mL Oral TID BM  . guaiFENesin  600 mg Oral BID  . influenza vac split quadrivalent PF  0.5 mL Intramuscular Tomorrow-1000  . ipratropium-albuterol  3 mL Nebulization TID  . lipase/protease/amylase  24,000 Units Oral BID WC  . methylPREDNISolone (SOLU-MEDROL) injection  80 mg Intravenous Q8H  . mirtazapine  15 mg Oral QHS  . multivitamin with minerals  1 tablet Oral Daily  . pantoprazole  40 mg Oral BID  . pregabalin  150 mg Oral BID  . senna-docusate  2 tablet Oral BID  . tiZANidine  2 mg Oral QHS   Continuous Infusions: . sodium chloride      Principal Problem:   Acute respiratory failure with hypoxia and hypercapnia (HCC) Active Problems:   Hypokalemia   COPD exacerbation (HCC)   Essential hypertension   Nausea & vomiting   Abdominal pain   Abnormal finding on urinalysis      Desiree Hane  [facility]

## 2019-04-05 DIAGNOSIS — J9602 Acute respiratory failure with hypercapnia: Secondary | ICD-10-CM

## 2019-04-05 DIAGNOSIS — J9601 Acute respiratory failure with hypoxia: Principal | ICD-10-CM

## 2019-04-05 DIAGNOSIS — R8290 Unspecified abnormal findings in urine: Secondary | ICD-10-CM

## 2019-04-05 MED ORDER — SENNOSIDES-DOCUSATE SODIUM 8.6-50 MG PO TABS: 2.0000 | ORAL_TABLET | Freq: Every evening | ORAL | Status: DC | PRN

## 2019-04-05 MED ORDER — PREDNISONE 20 MG PO TABS
40.0000 mg | ORAL_TABLET | Freq: Every day | ORAL | Status: DC
  Administered 2019-04-05 – 2019-04-07 (×3): 40 mg via ORAL
  Filled 2019-04-05 (×4): qty 2

## 2019-04-05 MED ORDER — MOMETASONE FURO-FORMOTEROL FUM 200-5 MCG/ACT IN AERO
2.0000 | INHALATION_SPRAY | Freq: Two times a day (BID) | RESPIRATORY_TRACT | Status: DC
  Administered 2019-04-05 – 2019-04-07 (×5): 2 via RESPIRATORY_TRACT
  Filled 2019-04-05: qty 8.8

## 2019-04-05 MED ORDER — TIOTROPIUM BROMIDE MONOHYDRATE 18 MCG IN CAPS
18.0000 ug | ORAL_CAPSULE | Freq: Every day | RESPIRATORY_TRACT | Status: DC
  Administered 2019-04-05 – 2019-04-07 (×3): 18 ug via RESPIRATORY_TRACT
  Filled 2019-04-05: qty 5

## 2019-04-05 MED ORDER — IPRATROPIUM-ALBUTEROL 0.5-2.5 (3) MG/3ML IN SOLN: 3.0000 mL | Freq: Four times a day (QID) | RESPIRATORY_TRACT | Status: DC | PRN

## 2019-04-05 NOTE — Evaluation (Signed)
Physical Therapy Evaluation Patient Details Name: Claudia Wiley MRN: [id number] DOB: April 11, 1956 Today's Date: 04/05/2019   History of Present Illness  Claudia Wiley is a 63 y.o. female with history of COPD, HTN, GERD, osteoporosis, ongoing tobacco abuse, and history of lung cancer status post lobectomy. Patient presents to the ER because of shortness of breath. Admitted for COPD exacerbation.    Clinical Impression  Claudia Wiley is a 63 y.o. female admitted with above HPI for COPD exacerbation. At baseline pt and her daughter report she is independent with mobility and does not wear supplemental O2 at home. She is currently on 3L/min via nasal canula and SpO2 remained at 90% or greater throughout therapy session. Patient was able to ambulate today with min guard and no device and was instructed on managing portable oxygen while mobilizing. Patient is close to her baseline however remains weak and slightly unsteady while managing O2. She will benefit from additional skilled PT at below venue to progress to PLOF with independence. She would also benefit from pulmonary rehab. Acute PT will follow and progress as able.    Follow Up Recommendations Home health PT;Other (comment)(pt would benefit from pulmonary rehab)    Equipment Recommendations  None recommended by PT    Recommendations for Other Services       Precautions / Restrictions Precautions Precautions: Fall Restrictions Weight Bearing Restrictions: No      Mobility  Bed Mobility      General bed mobility comments: pt seated at EOB at start of session and ended in the recliner  Transfers Overall transfer level: Needs assistance Equipment used: None Transfers: Sit to/from Stand Sit to Stand: Supervision         General transfer comment: pt requires cues for safe hand placement, able to perform power up without assist  Ambulation/Gait Ambulation/Gait assistance: Min guard Gait Distance (Feet): 200 Feet Assistive device:  None(pt pushed portable O2) Gait Pattern/deviations: Step-through pattern;Decreased step length - right;Decreased step length - left;Decreased stride length;Narrow base of support Gait velocity: decreased   General Gait Details: pt with slightly NBOS, no overt LOB noted during gait, pt instructed on management of portable O2 and performed ~100 feet while pushing O2 cannister, cues requird to negotiate obstacles with O2  Stairs            Wheelchair Mobility    Modified Rankin (Stroke Patients Only)       Balance Overall balance assessment: Mild deficits observed, not formally tested;Needs assistance Sitting-balance support: Feet supported Sitting balance-Leahy Scale: Good     Standing balance support: During functional activity Standing balance-Leahy Scale: Good            Pertinent Vitals/Pain Pain Assessment: No/denies pain    Home Living Family/patient expects to be discharged to:: Private residence Living Arrangements: Children(daughter and granddaughter) Available Help at Discharge: Family Type of Home: Apartment(condo) Home Access: Stairs to enter Entrance Stairs-Rails: Right Entrance Stairs-Number of Steps: 3; right rail going up Home Layout: One level Home Equipment: Environmental consultant - 2 wheels;Shower seat;Wheelchair - manual;Cane - single point Additional Comments: pt likes to bathe in tub, requires assistance to get up from the tun    Prior Function Level of Independence: Needs assistance   Gait / Transfers Assistance Needed: walks independent, no longer using walker, pt does require assist to get in/out of tub     Comments: daughter and granddaughter work in the evening     Hand Dominance        Extremity/Trunk Assessment  Upper Extremity Assessment Upper Extremity Assessment: Generalized weakness    Lower Extremity Assessment Lower Extremity Assessment: Generalized weakness    Cervical / Trunk Assessment Cervical / Trunk Assessment: Normal   Communication   Communication: No difficulties  Cognition Arousal/Alertness: Awake/alert Behavior During Therapy: WFL for tasks assessed/performed Overall Cognitive Status: Within Functional Limits for tasks assessed        General Comments: pt is frail appearing             Assessment/Plan    PT Assessment Patient needs continued PT services  PT Problem List Decreased strength;Decreased balance;Decreased mobility;Decreased activity tolerance       PT Treatment Interventions DME instruction;Gait training;Therapeutic exercise;Stair training;Functional mobility training;Balance training;Patient/family education;Therapeutic activities    PT Goals (Current goals can be found in the Care Plan section)  Acute Rehab PT Goals Patient Stated Goal: to return home and work her way off the oxygen again PT Goal Formulation: With patient Time For Goal Achievement: 04/19/19 Potential to Achieve Goals: Good    Frequency Min 3X/week    AM-PAC PT "6 Clicks" Mobility  Outcome Measure Help needed turning from your back to your side while in a flat bed without using bedrails?: A Little Help needed moving from lying on your back to sitting on the side of a flat bed without using bedrails?: A Little Help needed moving to and from a bed to a chair (including a wheelchair)?: A Little Help needed standing up from a chair using your arms (e.g., wheelchair or bedside chair)?: A Little Help needed to walk in hospital room?: A Little Help needed climbing 3-5 steps with a railing? : A Little 6 Click Score: 18    End of Session Equipment Utilized During Treatment: Gait belt Activity Tolerance: Patient tolerated treatment well Patient left: in chair;with call bell/phone within reach;with family/visitor present Nurse Communication: Mobility status PT Visit Diagnosis: Unsteadiness on feet (R26.81);Muscle weakness (generalized) (M62.81)    Time: [id number] PT Time Calculation (min) (ACUTE ONLY): 23  min   Charges:   PT Evaluation $PT Eval Low Complexity: 1 Low PT Treatments $Gait Training: 8-22 mins        Kipp Brood, PT, DPT, [facility] Physical Therapist with [facility]  04/05/2019 12:54 PM

## 2019-04-05 NOTE — Progress Notes (Signed)
[facility]  PROGRESS NOTE  Claudia Wiley [id number] DOB: 12/15/1955 DOA: 04/02/2019 PCP: System, Pcp Not In  Brief History    Claudia Wiley is a 63 y.o. year old female with medical history significant for h/o lung cancer s/p resection, h/o gastrinoma s/p whipple, esophageal stricture, pancreatic insufficiency, hypertension, Copd (not on home o2 who presented on 04/02/2019 with worsening shortness of breath, worsening abdominal pain, decreased appetite in setting of nausea and vomiting, slightly productive cough and was found to have acute hypoxic hypercarbic respiratory failure secondary to COPD exacerbation and nausea vomiting of unclear etiology.  ED course: Afebrile, heart rate 113, respiratory rate 20, BP 125/87 pulse ox 80% on room air requiring 2 L.  Chest x-ray negative for acute abnormalities.  Lab work notable for unremarkable CBC and CMP.  UA with WBC 5-10, negative nitrates, no bacteria, COVID-19 negative.  VBG pH 7.32, CO2 74.  Patient was admitted for acute hypoxic hypercapnic respiratory failure secondary to COPD exacerbation.  Patient declined BiPAP in the ED.  A & P     Acute hypoxic/hypercarbic respiratory failure secondary to COPD exacerbation, improving.  Worsening dyspnea, worsening cough, new O2 requirements (intermittently uses O2 at home, currently on 2 L after desaturation of 83% on room air in the ED).  Chest x-ray unremarkable.  Consistent with COPD flare.  Improvement in work of breathing, scant wheezing, will monitor on home inhaler regimen and oral prednisone, if remains clinically stable anticipate discharge next 24 hours.  Ambulatory O2 test, PT eval--home health PT.  flutter valve, incentive spirometry, IV Solu-Medrol, scheduled Mucinex.  Patient declined BiPAP in the ED, does have significant hypercarbia on VBG in the 70s however maintaining normal mental status and stable respiratory status on current medication regimen.  Would benefit from continued O2  therapy on discharge.  Followed by pulmonology at [facility] within normal limits.  Likely due to nausea vomiting on admission.  No recurrent episodes here.  Status post repletion, closely monitor BMP   Hypertension, stable.  Continue home amlodipine.   Pancreatic insufficiency secondary to History of gastrinoma, s/p whipple stable.  Status post pancreatic and duodenal resection in [facility] for gastrum) continue home Creon.   Nausea and vomiting with abdominal pain.  Stable.  On x-ray shows no constipation/obstruction or other abnormalities.  Having BMs.  Admits diminished appetite but tolerating  Ensure shakes and some of breakfast today, does not like food here.    Closely monitor.  Lipase and LFTs wnl. Continue Protonix BID.  If does not improve with supportive care will consider GI consultation given notable GI history   History of esophageal stenosis/low-grade esophagitis (EGD 01/2018).  Continue PPI.  Not complaining currently of dysphagia closely monitor   Insomnia, stable.  Continue Elavil   Abnormal UA  Patient denies any urinary symptoms.  UA only shows slight leukocytes.  Will discontinue ceftriaxone and closely monitor.     DVT prophylaxis: Lovenox Code Status: Full code Family Communication: Spoke with daughter outside the room and updated on status Disposition Plan: Closely monitor respiratory status on home inhaler regimen oral prednisone, if remains clinically stable anticipate discharge next 24 hours.     [facility] Direct contact: see [email or web address] (further directions at bottom of note if needed) 7PM-7AM contact night coverage as at bottom of note 04/05/2019, 5:07 PM  LOS: 3 days   Consultants  . None  Procedures  . BiPAP attempted on admission, patient declined  Antibiotics  . None  Interval History/Subjective  Feels breathing improved from yesterday Ate half a pancake this morning Still drinking ensures 3 times a day   Objective   Vitals:  Vitals:   04/05/19 1300 04/05/19 1302  BP:  117/75  Pulse: (!) 114 (!) 101  Resp:  17  Temp:  98 F (36.7 C)  SpO2: (!) 83% 93%    Exam:  Awake Alert, Oriented X 3, No new F.N deficits, Normal affect [place].AT, Symmetrical Chest wall movement, , decreased air movement, scant wheezing, no accessory muscle use, no conversational dyspnea, currently on 2 L nasal cannula. RRR,No Gallops,Rubs or new Murmurs,  +ve B.Sounds, Abd Soft, nontender, No rebound - guarding or rigidity. No Cyanosis, Clubbing or edema, No new Rash or bruise     I have personally reviewed the following:   Data Reviewed: Basic Metabolic Panel: Recent Labs  Lab 04/02/19 1923 04/03/19 0230 04/04/19 1401  NA 143 141 141  K 3.2* 3.3* 4.1  CL 98 96* 98  CO2 31 33* 35*  GLUCOSE 87 108* 125*  BUN 7 7 19   CREATININE 0.70 0.52 0.51  CALCIUM 8.8* 8.6* 8.9  MG  --  2.3  --    Liver Function Tests: Recent Labs  Lab 04/02/19 1923 04/03/19 0230  AST 25 23  ALT 15 16  ALKPHOS 85 87  BILITOT 0.2* 0.6  PROT 6.3* 6.6  ALBUMIN 3.5 3.5   Recent Labs  Lab 04/03/19 0230  LIPASE 21   No results for input(s): AMMONIA in the last 168 hours. CBC: Recent Labs  Lab 04/02/19 1923 04/03/19 0230  WBC 10.8* 8.8  NEUTROABS 5.3  --   HGB 13.5 13.8  HCT 43.2 44.7  MCV 88.9 88.9  PLT 265 291   Cardiac Enzymes: No results for input(s): CKTOTAL, CKMB, CKMBINDEX, TROPONINI in the last 168 hours. BNP (last 3 results) Recent Labs    06/15/18 1725  BNP 64.1    ProBNP (last 3 results) No results for input(s): PROBNP in the last 8760 hours.  CBG: No results for input(s): GLUCAP in the last 168 hours.  Recent Results (from the past 240 hour(s))  SARS Coronavirus 2 [employer] order, Performed in [facility] hospital lab) Nasopharyngeal Nasopharyngeal Swab     Status: None   Collection Time: 04/02/19  7:39 PM   Specimen: Nasopharyngeal Swab  Result Value Ref Range Status   SARS Coronavirus  2 NEGATIVE NEGATIVE Final    Comment: (NOTE) If result is NEGATIVE SARS-CoV-2 target nucleic acids are NOT DETECTED. The SARS-CoV-2 RNA is generally detectable in upper and lower  respiratory specimens during the acute phase of infection. The lowest  concentration of SARS-CoV-2 viral copies this assay can detect is 250  copies / mL. A negative result does not preclude SARS-CoV-2 infection  and should not be used as the sole basis for treatment or other  patient management decisions.  A negative result may occur with  improper specimen collection / handling, submission of specimen other  than nasopharyngeal swab, presence of viral mutation(s) within the  areas targeted by this assay, and inadequate number of viral copies  (<250 copies / mL). A negative result must be combined with clinical  observations, patient history, and epidemiological information. If result is POSITIVE SARS-CoV-2 target nucleic acids are DETECTED. The SARS-CoV-2 RNA is generally detectable in upper and lower  respiratory specimens dur ing the acute phase of infection.  Positive  results are indicative of active infection with SARS-CoV-2.  Clinical  correlation with patient history and  other diagnostic information is  necessary to determine patient infection status.  Positive results do  not rule out bacterial infection or co-infection with other viruses. If result is PRESUMPTIVE POSTIVE SARS-CoV-2 nucleic acids MAY BE PRESENT.   A presumptive positive result was obtained on the submitted specimen  and confirmed on repeat testing.  While 2019 novel coronavirus  (SARS-CoV-2) nucleic acids may be present in the submitted sample  additional confirmatory testing may be necessary for epidemiological  and / or clinical management purposes  to differentiate between  SARS-CoV-2 and other Sarbecovirus currently known to infect humans.  If clinically indicated additional testing with an alternate test  methodology  [phone]) is advised. The SARS-CoV-2 RNA is generally  detectable in upper and lower respiratory sp ecimens during the acute  phase of infection. The expected result is Negative. Fact Sheet for Patients:  StrictlyIdeas.no Fact Sheet for Healthcare Providers: BankingDealers.co.za This test is not yet approved or cleared by the [place] FDA and has been authorized for detection and/or diagnosis of SARS-CoV-2 by FDA under an Emergency Use Authorization (EUA).  This EUA will remain in effect (meaning this test can be used) for the duration of the COVID-19 declaration under Section 564(b)(1) of the Act, 21 U.S.C. section 360bbb-3(b)(1), unless the authorization is terminated or revoked sooner. Performed at [facility], [place] [address], [place], [place] [address]      Studies: No results found.  Scheduled Meds: . amitriptyline  10 mg Oral QHS  . amLODipine  2.5 mg Oral Daily  . cholecalciferol  800 Units Oral Daily  . enoxaparin (LOVENOX) injection  40 mg Subcutaneous Q24H  . feeding supplement (ENSURE ENLIVE)  237 mL Oral TID BM  . guaiFENesin  600 mg Oral BID  . influenza vac split quadrivalent PF  0.5 mL Intramuscular Tomorrow-1000  . lipase/protease/amylase  24,000 Units Oral BID WC  . mirtazapine  15 mg Oral QHS  . mometasone-formoterol  2 puff Inhalation BID  . multivitamin with minerals  1 tablet Oral Daily  . pantoprazole  40 mg Oral BID  . predniSONE  40 mg Oral Q breakfast  . pregabalin  150 mg Oral BID  . sodium chloride flush  3 mL Intravenous Q12H  . tiotropium  18 mcg Inhalation Daily  . tiZANidine  2 mg Oral QHS   Continuous Infusions: . sodium chloride      Principal Problem:   Acute respiratory failure with hypoxia and hypercapnia (HCC) Active Problems:   Hypokalemia   COPD exacerbation (HCC)   Essential hypertension   Nausea & vomiting   Abdominal pain   Abnormal finding on  urinalysis      Desiree Hane  [facility]

## 2019-04-06 DIAGNOSIS — J9601 Acute respiratory failure with hypoxia: Secondary | ICD-10-CM | POA: Diagnosis not present

## 2019-04-06 MED ORDER — PREDNISONE 20 MG PO TABS: 40.0000 mg | ORAL_TABLET | Freq: Every day | ORAL | 0 refills | Status: DC

## 2019-04-06 MED ORDER — POLYETHYLENE GLYCOL 3350 17 G PO PACK: 17.0000 g | PACK | Freq: Every day | ORAL | 0 refills | Status: DC | PRN

## 2019-04-06 MED ORDER — ALUM & MAG HYDROXIDE-SIMETH 200-200-20 MG/5ML PO SUSP
15.0000 mL | Freq: Four times a day (QID) | ORAL | Status: DC | PRN
  Administered 2019-04-06: 22:00:00 15 mL via ORAL
  Filled 2019-04-06: qty 30

## 2019-04-06 NOTE — TOC Initial Note (Addendum)
Transition of Care [facility]) - Initial/Assessment Note    Patient Details  Name: Claudia Wiley MRN: [id number] Date of Birth: [birth year]  [facility]) CM/SW Contact:    Lynnell Catalan, RN Phone Number: 04/06/2019, 2:38 PM  Clinical Narrative:                 This CM was contacted by the [facility] to inform us that pt goes there for primary care and they would need to set up pt's home services. This CM faxed home health orders to [facility] [phone]) ext [phone] Fax [phone]). The home 02 will also need to go through the [place] as the pt only has Medicare part A. Awaiting nursing staff to document desaturation screen. Once desat screen completed will fax all info to the [place] [facility] for the home 02.  Addendum: Home 02 orders and desat screen faxed to [place] at 1545.  Expected Discharge Plan: [place] Barriers to Discharge: No Barriers Identified   Patient Goals and CMS Choice        Expected Discharge Plan and Services Expected Discharge Plan: [place]                         DME Arranged: Oxygen DME Agency: [employer] Date DME Agency Contacted: 04/06/19 Time DME Agency Contacted: 35 Representative spoke with at DME Agency: Thedore Mins HH Arranged: PT          Prior Living Arrangements/Services                       Activities of Daily Living Home Assistive Devices/Equipment: None ADL Screening (condition at time of admission) Patient's cognitive ability adequate to safely complete daily activities?: Yes Is the patient deaf or have difficulty hearing?: No Does the patient have difficulty seeing, even when wearing glasses/contacts?: No Does the patient have difficulty concentrating, remembering, or making decisions?: No Patient able to express need for assistance with ADLs?: Yes Does the patient have difficulty dressing or bathing?: No Independently performs ADLs?: Yes (appropriate for developmental age) Does  the patient have difficulty walking or climbing stairs?: No Weakness of Legs: None Weakness of Arms/Hands: None  Permission Sought/Granted                  Emotional Assessment              Admission diagnosis:  Dyspnea [R06.00] Nausea & vomiting [R11.2] Hypoxia [R09.02] Abdominal pain [R10.9] Acute on chronic respiratory failure with hypercapnia (HCC) [J96.22] Nausea and vomiting, intractability of vomiting not specified, unspecified vomiting type [R11.2] COPD exacerbation ([place]) [J44.1] Patient Active Problem List   Diagnosis Date Noted  . Nausea & vomiting 04/03/2019  . Abdominal pain 04/03/2019  . Abnormal finding on urinalysis 04/03/2019  . Acute respiratory failure with hypoxia and hypercapnia ([place]) 04/02/2019  . COPD exacerbation ([place]) 06/15/2018  . Essential hypertension 06/15/2018  . Genetic testing 06/01/2018  . Gastrinoma   . Family history of melanoma   . Family history of brain cancer   . Hypokalemia 10/19/2011  . Severe protein-calorie malnutrition ([place] [place]) 10/19/2011  . Weight loss 10/19/2011  . Generalized weakness 10/19/2011  . Dysphagia 10/19/2011  . Anodontia 10/19/2011   PCP:  System, Pcp Not In Pharmacy:   [place], [address] [place] [address] Phone: [phone] Fax: [phone]  [place] -  [place], [address] [id number] [place] [address] Phone: [phone] Fax: [phone]     Social Determinants of Health (SDOH) Interventions    Readmission Risk Interventions No flowsheet data found.

## 2019-04-06 NOTE — Progress Notes (Signed)
   04/06/19 1136  MEWS Score  Pulse Rate (!) 120  SpO2 (!) 84 % (walked without o2 in the hallway)  MEWS Score  MEWS RR 0  MEWS Pulse 2  MEWS Systolic 0  MEWS LOC 0  MEWS Temp 0  MEWS Score 2  MEWS Score Color Yellow  Pt was walking without 02. Dr. Lisbeth Ply was notified. Dr Chauncey Cruel. [place] will order home 02.

## 2019-04-06 NOTE — Progress Notes (Signed)
[facility]  PROGRESS NOTE  Claudia Wiley [id number] DOB: 1955-10-12 DOA: 04/02/2019 PCP: System, Pcp Not In  Brief History    Claudia Wiley is a 63 y.o. year old female with medical history significant for h/o lung cancer s/p resection, h/o gastrinoma s/p whipple, esophageal stricture, pancreatic insufficiency, hypertension, Copd (not on home o2 who presented on 04/02/2019 with worsening shortness of breath, worsening abdominal pain, decreased appetite in setting of nausea and vomiting, slightly productive cough and was found to have acute hypoxic hypercarbic respiratory failure secondary to COPD exacerbation and nausea vomiting of unclear etiology.  ED course: Afebrile, heart rate 113, respiratory rate 20, BP 125/87 pulse ox 80% on room air requiring 2 L.  Chest x-ray negative for acute abnormalities.  Lab work notable for unremarkable CBC and CMP.  UA with WBC 5-10, negative nitrates, no bacteria, COVID-19 negative.  VBG pH 7.32, CO2 74.  Patient was admitted for acute hypoxic hypercapnic respiratory failure secondary to COPD exacerbation.  Patient declined BiPAP in the ED.  A & P     Acute hypoxic/hypercarbic respiratory failure secondary to COPD exacerbation, improving. On home inhaler regimen and prednisone. Ambulatory o2 test proves needs O2. Awaiting o2 order from [place] ( faxed by c/m this afternoon), likely dc in am on 9/23 once home O2 confirmed as patient needs this due to desaturations with minimal exertion to decrease risk of recurrence. Discussed with daughter plan. D[facility]   Hypokalemia,resolved mag within normal limits.  Likely due to nausea vomiting on admission.  No recurrent episodes here.  Status post repletion   Hypertension, stable.  Continue home amlodipine.   Pancreatic insufficiency secondary to History of gastrinoma, s/p whipple stable.  Status post pancreatic and duodenal resection in 2015 [facility] for  gastrum) continue home Creon.   Nausea and vomiting with abdominal pain, resolved.  On x-ray shows no constipation/obstruction or other abnormalities.  Having BMs.  Admits diminished appetite but tolerating  Ensure shakes and some of breakfast today, does not like food here.    Lipase and LFTs wnl. Continue Protonix BID.    History of esophageal stenosis/low-grade esophagitis (EGD 01/2018).  Continue PPI.  Not complaining currently of dysphagia closely monitor   Insomnia, stable.  Continue Elavil   Abnormal UA  Patient denies any urinary symptoms.  UA only shows slight leukocytes.  Will discontinue ceftriaxone and closely monitor.     DVT prophylaxis: Lovenox Code Status: Full code Family Communication: Spoke with daughter on phone Disposition Plan: likely dc in am on 9/23 once home O2 via [place] confirmed with c/m as patient needs this due to desaturations with minimal exertion to decrease risk of recurrence. Discussed with daughter plan      [facility] Direct contact: see [email or web address] (further directions at bottom of note if needed) 7PM-7AM contact night coverage as at bottom of note 04/06/2019, 5:56 PM  LOS: 4 days   Consultants  . None  Procedures  . BiPAP attempted on admission, patient declined  Antibiotics  . None  Interval History/Subjective  Breathing better Ate breakfast  Objective   Vitals:  Vitals:   04/06/19 1136 04/06/19 1138  BP:  117/80  Pulse: (!) 120 (!) 109  Resp:  20  Temp:  97.6 F (36.4 C)  SpO2: (!) 84% 93%    Exam:  Awake Alert, Oriented X 3, No new F.N deficits, Normal affect [place].AT, Symmetrical Chest wall movement, , decreased air movement, scant wheezing, no accessory  muscle use, no conversational dyspnea, currently on 2 L nasal cannula. RRR,No Gallops,Rubs or new Murmurs,  +ve B.Sounds, Abd Soft, nontender, No rebound - guarding or rigidity. No Cyanosis, Clubbing or edema, No new Rash or bruise     I have personally reviewed  the following:   Data Reviewed: Basic Metabolic Panel: Recent Labs  Lab 04/02/19 1923 04/03/19 0230 04/04/19 1401  NA 143 141 141  K 3.2* 3.3* 4.1  CL 98 96* 98  CO2 31 33* 35*  GLUCOSE 87 108* 125*  BUN 7 7 19   CREATININE 0.70 0.52 0.51  CALCIUM 8.8* 8.6* 8.9  MG  --  2.3  --    Liver Function Tests: Recent Labs  Lab 04/02/19 1923 04/03/19 0230  AST 25 23  ALT 15 16  ALKPHOS 85 87  BILITOT 0.2* 0.6  PROT 6.3* 6.6  ALBUMIN 3.5 3.5   Recent Labs  Lab 04/03/19 0230  LIPASE 21   No results for input(s): AMMONIA in the last 168 hours. CBC: Recent Labs  Lab 04/02/19 1923 04/03/19 0230  WBC 10.8* 8.8  NEUTROABS 5.3  --   HGB 13.5 13.8  HCT 43.2 44.7  MCV 88.9 88.9  PLT 265 291   Cardiac Enzymes: No results for input(s): CKTOTAL, CKMB, CKMBINDEX, TROPONINI in the last 168 hours. BNP (last 3 results) Recent Labs    06/15/18 1725  BNP 64.1    ProBNP (last 3 results) No results for input(s): PROBNP in the last 8760 hours.  CBG: No results for input(s): GLUCAP in the last 168 hours.  Recent Results (from the past 240 hour(s))  SARS Coronavirus 2 [facility] order, Performed in [facility] hospital lab) Nasopharyngeal Nasopharyngeal Swab     Status: None   Collection Time: 04/02/19  7:39 PM   Specimen: Nasopharyngeal Swab  Result Value Ref Range Status   SARS Coronavirus 2 NEGATIVE NEGATIVE Final    Comment: (NOTE) If result is NEGATIVE SARS-CoV-2 target nucleic acids are NOT DETECTED. The SARS-CoV-2 RNA is generally detectable in upper and lower  respiratory specimens during the acute phase of infection. The lowest  concentration of SARS-CoV-2 viral copies this assay can detect is 250  copies / mL. A negative result does not preclude SARS-CoV-2 infection  and should not be used as the sole basis for treatment or other  patient management decisions.  A negative result may occur with  improper specimen collection / handling, submission of specimen other   than nasopharyngeal swab, presence of viral mutation(s) within the  areas targeted by this assay, and inadequate number of viral copies  (<250 copies / mL). A negative result must be combined with clinical  observations, patient history, and epidemiological information. If result is POSITIVE SARS-CoV-2 target nucleic acids are DETECTED. The SARS-CoV-2 RNA is generally detectable in upper and lower  respiratory specimens dur ing the acute phase of infection.  Positive  results are indicative of active infection with SARS-CoV-2.  Clinical  correlation with patient history and other diagnostic information is  necessary to determine patient infection status.  Positive results do  not rule out bacterial infection or co-infection with other viruses. If result is PRESUMPTIVE POSTIVE SARS-CoV-2 nucleic acids MAY BE PRESENT.   A presumptive positive result was obtained on the submitted specimen  and confirmed on repeat testing.  While 2019 novel coronavirus  (SARS-CoV-2) nucleic acids may be present in the submitted sample  additional confirmatory testing may be necessary for epidemiological  and / or clinical management  purposes  to differentiate between  SARS-CoV-2 and other Sarbecovirus currently known to infect humans.  If clinically indicated additional testing with an alternate test  methodology [phone]) is advised. The SARS-CoV-2 RNA is generally  detectable in upper and lower respiratory sp ecimens during the acute  phase of infection. The expected result is Negative. Fact Sheet for Patients:  StrictlyIdeas.no Fact Sheet for Healthcare Providers: BankingDealers.co.za This test is not yet approved or cleared by the [place] FDA and has been authorized for detection and/or diagnosis of SARS-CoV-2 by FDA under an Emergency Use Authorization (EUA).  This EUA will remain in effect (meaning this test can be used) for the duration of the  COVID-19 declaration under Section 564(b)(1) of the Act, 21 U.S.C. section 360bbb-3(b)(1), unless the authorization is terminated or revoked sooner. Performed at [facility], [place] [address], [place], [place] [address]      Studies: No results found.  Scheduled Meds: . amitriptyline  10 mg Oral QHS  . amLODipine  2.5 mg Oral Daily  . cholecalciferol  800 Units Oral Daily  . enoxaparin (LOVENOX) injection  40 mg Subcutaneous Q24H  . feeding supplement (ENSURE ENLIVE)  237 mL Oral TID BM  . guaiFENesin  600 mg Oral BID  . lipase/protease/amylase  24,000 Units Oral BID WC  . mirtazapine  15 mg Oral QHS  . mometasone-formoterol  2 puff Inhalation BID  . multivitamin with minerals  1 tablet Oral Daily  . pantoprazole  40 mg Oral BID  . predniSONE  40 mg Oral Q breakfast  . pregabalin  150 mg Oral BID  . sodium chloride flush  3 mL Intravenous Q12H  . tiotropium  18 mcg Inhalation Daily  . tiZANidine  2 mg Oral QHS   Continuous Infusions: . sodium chloride      Principal Problem:   Acute respiratory failure with hypoxia and hypercapnia (HCC) Active Problems:   Hypokalemia   COPD exacerbation (HCC)   Essential hypertension   Nausea & vomiting   Abdominal pain   Abnormal finding on urinalysis      Desiree Hane  [facility]

## 2019-04-06 NOTE — Progress Notes (Addendum)
SATURATION QUALIFICATIONS: (This note is used to comply with regulatory documentation for home oxygen)  Patient Saturations on Room Air at Rest 95%  Patient Saturations on Room Air while Ambulating 84%  Patient Saturations on 0 Liters of oxygen while Ambulating  In the hall  Please briefly explain why patient needs home oxygen:O2 SAT 84

## 2019-04-06 NOTE — Care Management Important Message (Signed)
Important Message  Patient Details IM Letter given to Marney Doctor RN to present to the Patient Name: Claudia Wiley MRN: [id number] Date of Birth: 07/17/1955   Medicare Important Message Given:  Yes     Briannah, Brenden 04/06/2019, 9:42 AM

## 2019-04-07 DIAGNOSIS — R112 Nausea with vomiting, unspecified: Secondary | ICD-10-CM

## 2019-04-07 NOTE — TOC Progression Note (Signed)
Transition of Care [facility]) - Progression Note    Patient Details  Name: Claudia Wiley MRN: [id number] Date of Birth: [birth year]  [facility] CM/SW Contact  Teegan Guinther, Marjie Skiff, RN Phone Number: 04/07/2019, 1:35 PM  Clinical Narrative:    This CM was contacted by [facility] this am for another desaturation screen to be sent today. This was faxed to the [place]. This CM spoke with pt daughter who states that the [place] is going to deliver concentrator and portable 02 tank to her home and daughter will bring the portable tank to the hospital to transport pt home. [facility] [place]. [facility] placed on [facility].   Expected Discharge Plan: [place] Barriers to Discharge: No Barriers Identified  Expected Discharge Plan and Services Expected Discharge Plan: [place]                  Social Determinants of Health (SDOH) Interventions    Readmission Risk Interventions No flowsheet data found.

## 2019-04-07 NOTE — Discharge Summary (Addendum)
Physician Discharge Summary  Claudia Wiley [id number] DOB: 1956/04/09 DOA: 04/02/2019  PCP: System, Pcp Not In  Admit date: 04/02/2019 Discharge date: 04/07/2019  Admitted From: Home  Disposition:  Home   Recommendations for Outpatient Follow-up and new medication changes:  1. Follow up with Primary Care in 7 days.  2. Patient has been placed on home 02. 3. No need for systemic steroids at discharge.   Home Health: Yes   Equipment/Devices:no    Discharge Condition: stable  CODE STATUS: full Diet recommendation: heart healthy   Brief/Interim Summary: 62 year old female who presented with dyspnea.  She does have significant past medical history for lung cancer status post resection, history of gastrinoma status post Whipple procedure, esophageal stricture, pancreatic insufficiency, hypertension, and COPD.  She presented with several days of dyspnea, nausea and vomiting along with cough.  On her initial physical examination her temperature was 98.9, heart rate 113, respiratory rate 20, blood pressure 125/87, oxygen saturation 83% on room air.  Lungs had bilateral expiratory wheezing, no Rales, heart S1-S2 present, tachycardic, her abdomen was soft, no lower extremity edema.  Her venous blood gas had a pH of 7.32 with a PCO2 74 and PO2 103, bicarb 37.6.  Sodium 143, potassium 3.2, chloride 98, bicarb 31, glucose 87, BUN 7, creatinine 0.7, white count 10.8, hemoglobin 13.5, hematocrit 43.2, platelets 265.  SARS COVID-19 was negative.  Urine analysis had 6-10 white cells, 0-5 red cells.  Her chest radiograph had hyperinflation.  EKG with 105 bpm, normal axis, normal intervals, sinus rhythm with right atrial enlargement, no ST segment or T wave changes.  Patient was admitted to the hospital with a working diagnosis of acute hypoxic/hypercapnic respiratory failure due to COPD exacerbation.  1. Acute hypoxic and hypercapnic respiratory failure, due to COPD exacerbation in the setting of ongoing  tobacco abuse.  Patient was admitted to the medical ward, she was placed on aggressive bronchodilator therapy and systemic steroids.  Her symptoms slowly improved through the course of her hospitalization.  By the time of her discharge her oxygen saturation while ambulating on room air was 84 %, denoting a chronic hypoxic respiratory failure.  Social worker was consulted and home oxygen has been arranged.  Patient will continue bronchodilator therapy at home (albuterol, budesonide, formoterol and tiotropium), she was encouraged about smoking cessation.  2.  Hypertension.  Continue blood pressure control with amlodipine.  3.  Pancreatic insufficiency secondary to history of gastrinoma status post Whipple procedure.  Patient tolerated p.o. diet adequately, continue pancreatic enzymes.  4.  History of esophageal stenosis, low-grade esophagitis, her nausea and vomiting resolved, no signs of bowel obstruction.  Patient will continue pantoprazole for antiacid therapy.  5.  Patient rule out for urine tract infection.   6.  Hypokalemia.  Likely due to GI losses, her discharge potassium is 4.1.  Kidney function preserved with a serum creatinine of 0.51.  7.  Depression.  Continue amitriptyline and mirtazapine.  Discharge Diagnoses:  Principal Problem:   Acute respiratory failure with hypoxia and hypercapnia (HCC) Active Problems:   Hypokalemia   COPD exacerbation (HCC)   Essential hypertension   Nausea & vomiting   Abdominal pain   Abnormal finding on urinalysis    Discharge Instructions  Discharge Instructions    Diet - low sodium heart healthy   Complete by: As directed    Increase activity slowly   Complete by: As directed      Allergies as of 04/07/2019      Reactions  Azithromycin    Gabapentin       Medication List    STOP taking these medications   fluconazole 100 MG tablet Commonly known as: DIFLUCAN     TAKE these medications   acetaminophen 500 MG tablet Commonly  known as: TYLENOL Take 1,000 mg by mouth every 6 (six) hours as needed for moderate pain or headache.   albuterol 108 (90 Base) MCG/ACT inhaler Commonly known as: VENTOLIN HFA Inhale 2 puffs into the lungs every 6 (six) hours as needed for wheezing or shortness of breath.   albuterol 108 (90 Base) MCG/ACT inhaler Commonly known as: VENTOLIN HFA Inhale into the lungs every 6 (six) hours as needed for wheezing or shortness of breath.   amitriptyline 10 MG tablet Commonly known as: ELAVIL Take 10 mg by mouth at bedtime.   amLODipine 2.5 MG tablet Commonly known as: NORVASC Take 2.5 mg by mouth daily.   budesonide-formoterol 160-4.5 MCG/ACT inhaler Commonly known as: SYMBICORT Inhale 1 puff into the lungs daily.   cholecalciferol 10 MCG (400 UNIT) Tabs tablet Commonly known as: VITAMIN D3 Take 800 Units by mouth.   Ensure Take 237 mLs by mouth 3 (three) times daily between meals.   guaiFENesin 600 MG 12 hr tablet Commonly known as: MUCINEX Take 1 tablet (600 mg total) by mouth 2 (two) times daily.   lipase/protease/amylase 12000 units Cpep capsule Commonly known as: CREON Take 24,000 Units by mouth 2 (two) times daily.   mirtazapine 15 MG tablet Commonly known as: REMERON Take 15 mg by mouth at bedtime.   multivitamin tablet Take 1 tablet by mouth daily.   pantoprazole 40 MG tablet Commonly known as: PROTONIX Take 40 mg by mouth daily.   polyethylene glycol 17 g packet Commonly known as: MIRALAX / GLYCOLAX Take 17 g by mouth daily as needed for moderate constipation.   pregabalin 150 MG capsule Commonly known as: LYRICA Take 150 mg by mouth 2 (two) times daily.   tiotropium 18 MCG inhalation capsule Commonly known as: SPIRIVA Place 18 mcg into inhaler and inhale daily.   tiZANidine 4 MG capsule Commonly known as: ZANAFLEX Take 2 mg by mouth at bedtime.            Durable Medical Equipment  (From admission, onward)         Start     Ordered    04/06/19 1350  For home use only DME oxygen  Once    Question Answer Comment  Length of Need Lifetime   Mode or (Route) Nasal cannula   Liters per Minute 2   Frequency Continuous (stationary and portable oxygen unit needed)   Oxygen delivery system Gas      04/06/19 1349          Allergies  Allergen Reactions  . Azithromycin   . Gabapentin     Consultations:     Procedures/Studies: Dg Chest Port 1 View  Result Date: 04/02/2019 CLINICAL DATA:  Emesis and fever EXAM: PORTABLE CHEST 1 VIEW COMPARISON:  June 15, 2018 FINDINGS: The heart size and mediastinal contours are within normal limits. There is hyperinflation of the lung zones. Surgical sutures the left upper lung. The visualized skeletal structures are unremarkable. IMPRESSION: No acute cardiopulmonary process. Electronically Signed   By: Prudencio Pair M.D.   On: 04/02/2019 22:10   Dg Abd Portable 1v  Result Date: 04/03/2019 CLINICAL DATA:  Pt c/o dyspnea for the past several days and n/v. Medical hx of lung cancer, s/p resection,  h/o gastrinoma s/p whipple, esophageal stricture, pancreatic insufficiency, hypertension and COPD. EXAM: PORTABLE ABDOMEN - 1 VIEW COMPARISON:  CT 12/27/2012 and earlier studies FINDINGS: The bowel gas pattern is normal. Metallic clips in the right upper and right lower abdomen. Visualized lung bases clear. Bilateral pelvic phleboliths. Regional bones unremarkable. IMPRESSION: Normal bowel gas pattern. Electronically Signed   By: Lucrezia Europe M.D.   On: 04/03/2019 10:43      Procedures:   Subjective: Patient is feeling better, her dyspnea is now at baseline, no nausea or vomiting, tolerating po well.   Discharge Exam: Vitals:   04/07/19 0626 04/07/19 0738  BP: (!) 156/91   Pulse: (!) 107   Resp: 17   Temp: 98.4 F (36.9 C)   SpO2: 92% 96%   Vitals:   04/06/19 1914 04/06/19 2107 04/07/19 0626 04/07/19 0738  BP:  136/75 (!) 156/91   Pulse:  (!) 108 (!) 107   Resp:  18 17   Temp:    98.4 F (36.9 C)   TempSrc:   Oral   SpO2: 96% 92% 92% 96%  Weight:      Height:        General: Not in pain or dyspnea.  Neurology: Awake and alert, non focal  E ENT: mild pallor, no icterus, oral mucosa moist Cardiovascular: No JVD. S1-S2 present, rhythmic, no gallops, rubs, or murmurs. No lower extremity edema. Pulmonary: positive breath sounds bilaterally, decreased air movement, with no wheezing, rhonchi or rales. Gastrointestinal. Abdomen with no organomegaly, non tender, no rebound or guarding Skin. No rashes Musculoskeletal: no joint deformities   The results of significant diagnostics from this hospitalization (including imaging, microbiology, ancillary and laboratory) are listed below for reference.     Microbiology: Recent Results (from the past 240 hour(s))  SARS Coronavirus 2 [facility] order, Performed in [facility] hospital lab) Nasopharyngeal Nasopharyngeal Swab     Status: None   Collection Time: 04/02/19  7:39 PM   Specimen: Nasopharyngeal Swab  Result Value Ref Range Status   SARS Coronavirus 2 NEGATIVE NEGATIVE Final    Comment: (NOTE) If result is NEGATIVE SARS-CoV-2 target nucleic acids are NOT DETECTED. The SARS-CoV-2 RNA is generally detectable in upper and lower  respiratory specimens during the acute phase of infection. The lowest  concentration of SARS-CoV-2 viral copies this assay can detect is 250  copies / mL. A negative result does not preclude SARS-CoV-2 infection  and should not be used as the sole basis for treatment or other  patient management decisions.  A negative result may occur with  improper specimen collection / handling, submission of specimen other  than nasopharyngeal swab, presence of viral mutation(s) within the  areas targeted by this assay, and inadequate number of viral copies  (<250 copies / mL). A negative result must be combined with clinical  observations, patient history, and epidemiological information. If result is  POSITIVE SARS-CoV-2 target nucleic acids are DETECTED. The SARS-CoV-2 RNA is generally detectable in upper and lower  respiratory specimens dur ing the acute phase of infection.  Positive  results are indicative of active infection with SARS-CoV-2.  Clinical  correlation with patient history and other diagnostic information is  necessary to determine patient infection status.  Positive results do  not rule out bacterial infection or co-infection with other viruses. If result is PRESUMPTIVE POSTIVE SARS-CoV-2 nucleic acids MAY BE PRESENT.   A presumptive positive result was obtained on the submitted specimen  and confirmed on repeat testing.  While 2019  novel coronavirus  (SARS-CoV-2) nucleic acids may be present in the submitted sample  additional confirmatory testing may be necessary for epidemiological  and / or clinical management purposes  to differentiate between  SARS-CoV-2 and other Sarbecovirus currently known to infect humans.  If clinically indicated additional testing with an alternate test  methodology [phone]) is advised. The SARS-CoV-2 RNA is generally  detectable in upper and lower respiratory sp ecimens during the acute  phase of infection. The expected result is Negative. Fact Sheet for Patients:  StrictlyIdeas.no Fact Sheet for Healthcare Providers: BankingDealers.co.za This test is not yet approved or cleared by the [place] FDA and has been authorized for detection and/or diagnosis of SARS-CoV-2 by FDA under an Emergency Use Authorization (EUA).  This EUA will remain in effect (meaning this test can be used) for the duration of the COVID-19 declaration under Section 564(b)(1) of the Act, 21 U.S.C. section 360bbb-3(b)(1), unless the authorization is terminated or revoked sooner. Performed at [facility], [place] [address], [place], [place] [address]      Labs: BNP (last 3 results) Recent  Labs    06/15/18 1725  BNP [id number]   Basic Metabolic Panel: Recent Labs  Lab 04/02/19 1923 04/03/19 0230 04/04/19 1401  NA 143 141 141  K 3.2* 3.3* 4.1  CL 98 96* 98  CO2 31 33* 35*  GLUCOSE 87 108* 125*  BUN 7 7 19   CREATININE 0.70 0.52 0.51  CALCIUM 8.8* 8.6* 8.9  MG  --  2.3  --    Liver Function Tests: Recent Labs  Lab 04/02/19 1923 04/03/19 0230  AST 25 23  ALT 15 16  ALKPHOS 85 87  BILITOT 0.2* 0.6  PROT 6.3* 6.6  ALBUMIN 3.5 3.5   Recent Labs  Lab 04/03/19 0230  LIPASE 21   No results for input(s): AMMONIA in the last 168 hours. CBC: Recent Labs  Lab 04/02/19 1923 04/03/19 0230  WBC 10.8* 8.8  NEUTROABS 5.3  --   HGB 13.5 13.8  HCT 43.2 44.7  MCV 88.9 88.9  PLT 265 291   Cardiac Enzymes: No results for input(s): CKTOTAL, CKMB, CKMBINDEX, TROPONINI in the last 168 hours. BNP: Invalid input(s): POCBNP CBG: No results for input(s): GLUCAP in the last 168 hours. D-Dimer No results for input(s): DDIMER in the last 72 hours. Hgb A1c No results for input(s): HGBA1C in the last 72 hours. Lipid Profile No results for input(s): CHOL, HDL, LDLCALC, TRIG, CHOLHDL, LDLDIRECT in the last 72 hours. Thyroid function studies No results for input(s): TSH, T4TOTAL, T3FREE, THYROIDAB in the last 72 hours.  Invalid input(s): FREET3 Anemia work up No results for input(s): VITAMINB12, FOLATE, FERRITIN, TIBC, IRON, RETICCTPCT in the last 72 hours. Urinalysis    Component Value Date/Time   COLORURINE YELLOW 04/02/2019 1957   APPEARANCEUR CLEAR 04/02/2019 1957   LABSPEC 1.006 04/02/2019 1957   PHURINE 6.0 04/02/2019 [place] NEGATIVE 04/02/2019 1957   HGBUR NEGATIVE 04/02/2019 [place] NEGATIVE 04/02/2019 1957   KETONESUR 20 (A) 04/02/2019 1957   PROTEINUR NEGATIVE 04/02/2019 1957   UROBILINOGEN 0.2 12/27/2012 0454   NITRITE NEGATIVE 04/02/2019 1957   LEUKOCYTESUR TRACE (A) 04/02/2019 1957   Sepsis Labs Invalid input(s): PROCALCITONIN,   WBC,  LACTICIDVEN Microbiology Recent Results (from the past 240 hour(s))  SARS Coronavirus 2 [facility] order, Performed in [facility] hospital lab) Nasopharyngeal Nasopharyngeal Swab     Status: None   Collection Time: 04/02/19  7:39 PM  Specimen: Nasopharyngeal Swab  Result Value Ref Range Status   SARS Coronavirus 2 NEGATIVE NEGATIVE Final    Comment: (NOTE) If result is NEGATIVE SARS-CoV-2 target nucleic acids are NOT DETECTED. The SARS-CoV-2 RNA is generally detectable in upper and lower  respiratory specimens during the acute phase of infection. The lowest  concentration of SARS-CoV-2 viral copies this assay can detect is 250  copies / mL. A negative result does not preclude SARS-CoV-2 infection  and should not be used as the sole basis for treatment or other  patient management decisions.  A negative result may occur with  improper specimen collection / handling, submission of specimen other  than nasopharyngeal swab, presence of viral mutation(s) within the  areas targeted by this assay, and inadequate number of viral copies  (<250 copies / mL). A negative result must be combined with clinical  observations, patient history, and epidemiological information. If result is POSITIVE SARS-CoV-2 target nucleic acids are DETECTED. The SARS-CoV-2 RNA is generally detectable in upper and lower  respiratory specimens dur ing the acute phase of infection.  Positive  results are indicative of active infection with SARS-CoV-2.  Clinical  correlation with patient history and other diagnostic information is  necessary to determine patient infection status.  Positive results do  not rule out bacterial infection or co-infection with other viruses. If result is PRESUMPTIVE POSTIVE SARS-CoV-2 nucleic acids MAY BE PRESENT.   A presumptive positive result was obtained on the submitted specimen  and confirmed on repeat testing.  While 2019 novel coronavirus  (SARS-CoV-2) nucleic acids may be  present in the submitted sample  additional confirmatory testing may be necessary for epidemiological  and / or clinical management purposes  to differentiate between  SARS-CoV-2 and other Sarbecovirus currently known to infect humans.  If clinically indicated additional testing with an alternate test  methodology [phone]) is advised. The SARS-CoV-2 RNA is generally  detectable in upper and lower respiratory sp ecimens during the acute  phase of infection. The expected result is Negative. Fact Sheet for Patients:  StrictlyIdeas.no Fact Sheet for Healthcare Providers: BankingDealers.co.za This test is not yet approved or cleared by the [place] FDA and has been authorized for detection and/or diagnosis of SARS-CoV-2 by FDA under an Emergency Use Authorization (EUA).  This EUA will remain in effect (meaning this test can be used) for the duration of the COVID-19 declaration under Section 564(b)(1) of the Act, 21 U.S.C. section 360bbb-3(b)(1), unless the authorization is terminated or revoked sooner. Performed at [facility], [place] [address] [place], [place] [address]      Time coordinating discharge: 45 minutes  SIGNED:   Tawni Millers, MD  [facility] 04/07/2019, 10:21 AM

## 2019-04-07 NOTE — Progress Notes (Addendum)
SATURATION QUALIFICATIONS: (This note is used to comply with regulatory documentation for home oxygen)  Patient Saturations on Room Air at Rest = 89%  Patient Saturations on Room Air while Ambulating = 72%  Patient Saturations on 3 Liters of oxygen while Ambulating = 90%  Please briefly explain why patient needs home oxygen: Desaturation without oxygen

## 2019-04-07 NOTE — Progress Notes (Signed)
Reviewed discharge paperwork and medication regimen with patient and daughter. Patient transported to main entrance via wheelchair by NT.

## 2019-06-29 ENCOUNTER — Other Ambulatory Visit: Payer: Self-pay

## 2019-06-29 ENCOUNTER — Emergency Department (HOSPITAL_COMMUNITY): Payer: No Typology Code available for payment source

## 2019-06-29 ENCOUNTER — Encounter (HOSPITAL_COMMUNITY): Payer: Self-pay | Admitting: Emergency Medicine

## 2019-06-29 ENCOUNTER — Inpatient Hospital Stay (HOSPITAL_COMMUNITY)
Admission: EM | Admit: 2019-06-29 | Discharge: 2019-07-01 | DRG: 190 | Disposition: A | Discharge status: Home Health | Payer: No Typology Code available for payment source | Attending: Internal Medicine | Admitting: Internal Medicine

## 2019-06-29 DIAGNOSIS — Z681 Body mass index (BMI) 19 or less, adult: Secondary | ICD-10-CM

## 2019-06-29 DIAGNOSIS — Z7951 Long term (current) use of inhaled steroids: Secondary | ICD-10-CM

## 2019-06-29 DIAGNOSIS — Z85118 Personal history of other malignant neoplasm of bronchus and lung: Secondary | ICD-10-CM

## 2019-06-29 DIAGNOSIS — Z8249 Family history of ischemic heart disease and other diseases of the circulatory system: Secondary | ICD-10-CM

## 2019-06-29 DIAGNOSIS — J9621 Acute and chronic respiratory failure with hypoxia: Secondary | ICD-10-CM | POA: Diagnosis present

## 2019-06-29 DIAGNOSIS — K219 Gastro-esophageal reflux disease without esophagitis: Secondary | ICD-10-CM | POA: Diagnosis present

## 2019-06-29 DIAGNOSIS — Z833 Family history of diabetes mellitus: Secondary | ICD-10-CM

## 2019-06-29 DIAGNOSIS — E559 Vitamin D deficiency, unspecified: Secondary | ICD-10-CM | POA: Diagnosis present

## 2019-06-29 DIAGNOSIS — J449 Chronic obstructive pulmonary disease, unspecified: Secondary | ICD-10-CM | POA: Diagnosis present

## 2019-06-29 DIAGNOSIS — J9622 Acute and chronic respiratory failure with hypercapnia: Secondary | ICD-10-CM | POA: Diagnosis not present

## 2019-06-29 DIAGNOSIS — E46 Unspecified protein-calorie malnutrition: Secondary | ICD-10-CM | POA: Diagnosis present

## 2019-06-29 DIAGNOSIS — Z85028 Personal history of other malignant neoplasm of stomach: Secondary | ICD-10-CM

## 2019-06-29 DIAGNOSIS — Z20828 Contact with and (suspected) exposure to other viral communicable diseases: Secondary | ICD-10-CM | POA: Diagnosis present

## 2019-06-29 DIAGNOSIS — J441 Chronic obstructive pulmonary disease with (acute) exacerbation: Principal | ICD-10-CM | POA: Diagnosis present

## 2019-06-29 DIAGNOSIS — Z87891 Personal history of nicotine dependence: Secondary | ICD-10-CM

## 2019-06-29 DIAGNOSIS — Z79899 Other long term (current) drug therapy: Secondary | ICD-10-CM

## 2019-06-29 DIAGNOSIS — K8689 Other specified diseases of pancreas: Secondary | ICD-10-CM | POA: Diagnosis present

## 2019-06-29 DIAGNOSIS — E43 Unspecified severe protein-calorie malnutrition: Secondary | ICD-10-CM | POA: Diagnosis present

## 2019-06-29 DIAGNOSIS — M810 Age-related osteoporosis without current pathological fracture: Secondary | ICD-10-CM | POA: Diagnosis present

## 2019-06-29 DIAGNOSIS — R5381 Other malaise: Secondary | ICD-10-CM | POA: Diagnosis present

## 2019-06-29 DIAGNOSIS — I10 Essential (primary) hypertension: Secondary | ICD-10-CM | POA: Diagnosis present

## 2019-06-29 LAB — CBC WITH DIFFERENTIAL/PLATELET
Abs Immature Granulocytes: 0.09 10*3/uL — ABNORMAL HIGH (ref 0.00–0.07)
Basophils Absolute: 0 10*3/uL (ref 0.0–0.1)
Basophils Relative: 0 %
Eosinophils Absolute: 0 10*3/uL (ref 0.0–0.5)
Eosinophils Relative: 0 %
HCT: 46 % (ref 36.0–46.0)
Hemoglobin: 14 g/dL (ref 12.0–15.0)
Immature Granulocytes: 1 %
Lymphocytes Relative: 23 %
Lymphs Abs: 2.2 10*3/uL (ref 0.7–4.0)
MCH: 27.8 pg (ref 26.0–34.0)
MCHC: 30.4 g/dL (ref 30.0–36.0)
MCV: 91.5 fL (ref 80.0–100.0)
Monocytes Absolute: 0.9 10*3/uL (ref 0.1–1.0)
Monocytes Relative: 9 %
Neutro Abs: 6.3 10*3/uL (ref 1.7–7.7)
Neutrophils Relative %: 67 %
Platelets: 275 10*3/uL (ref 150–400)
RBC: 5.03 MIL/uL (ref 3.87–5.11)
RDW: 15.9 % — ABNORMAL HIGH (ref 11.5–15.5)
WBC: 9.4 10*3/uL (ref 4.0–10.5)
nRBC: 0 % (ref 0.0–0.2)

## 2019-06-29 LAB — BASIC METABOLIC PANEL
Anion gap: 12 (ref 5–15)
BUN: 19 mg/dL (ref 8–23)
CO2: 34 mmol/L — ABNORMAL HIGH (ref 22–32)
Calcium: 9.3 mg/dL (ref 8.9–10.3)
Chloride: 93 mmol/L — ABNORMAL LOW (ref 98–111)
Creatinine, Ser: 0.78 mg/dL (ref 0.44–1.00)
GFR calc Af Amer: >60 mL/min (ref >60)
GFR calc non Af Amer: >60 mL/min (ref >60)
Glucose, Bld: 82 mg/dL (ref 70–99)
Potassium: 4.6 mmol/L (ref 3.5–5.1)
Sodium: 139 mmol/L (ref 135–145)

## 2019-06-29 LAB — HEPATIC FUNCTION PANEL
ALT: 21 U/L (ref 0–44)
AST: 32 U/L (ref 15–41)
Albumin: 3.8 g/dL (ref 3.5–5.0)
Alkaline Phosphatase: 95 U/L (ref 38–126)
Bilirubin, Direct: 0.2 mg/dL (ref 0.0–0.2)
Indirect Bilirubin: 0.4 mg/dL (ref 0.3–0.9)
Total Bilirubin: 0.6 mg/dL (ref 0.3–1.2)
Total Protein: 6.6 g/dL (ref 6.5–8.1)

## 2019-06-29 LAB — MAGNESIUM: Magnesium: 1.8 mg/dL (ref 1.7–2.4)

## 2019-06-29 LAB — BLOOD GAS, VENOUS
Acid-Base Excess: 5.4 mmol/L — ABNORMAL HIGH (ref 0.0–2.0)
Bicarbonate: 35.3 mmol/L — ABNORMAL HIGH (ref 20.0–28.0)
O2 Saturation: 41.1 %
Patient temperature: 98.6
pCO2, Ven: 80.8 mmHg (ref 44.0–60.0)
pH, Ven: 7.263 (ref 7.250–7.430)
pO2, Ven: <31 mmHg — CL (ref 32.0–45.0)

## 2019-06-29 LAB — SARS CORONAVIRUS 2 (TAT 6-24 HRS): SARS Coronavirus 2: NEGATIVE

## 2019-06-29 LAB — POC SARS CORONAVIRUS 2 AG -  ED: SARS Coronavirus 2 Ag: NEGATIVE

## 2019-06-29 LAB — PHOSPHORUS: Phosphorus: 4.1 mg/dL (ref 2.5–4.6)

## 2019-06-29 MED ORDER — MAGNESIUM SULFATE 2 GM/50ML IV SOLN
2.0000 g | Freq: Once | INTRAVENOUS | Status: AC
  Administered 2019-06-29: 07:00:00 2 g via INTRAVENOUS
  Filled 2019-06-29: qty 50

## 2019-06-29 MED ORDER — KETOROLAC TROMETHAMINE 15 MG/ML IJ SOLN
15.0000 mg | Freq: Once | INTRAMUSCULAR | Status: AC
  Administered 2019-06-29: 17:00:00 15 mg via INTRAVENOUS
  Filled 2019-06-29: qty 1

## 2019-06-29 MED ORDER — IPRATROPIUM-ALBUTEROL 20-100 MCG/ACT IN AERS: 2.0000 | INHALATION_SPRAY | Freq: Four times a day (QID) | RESPIRATORY_TRACT | Status: DC

## 2019-06-29 MED ORDER — ALBUTEROL SULFATE (2.5 MG/3ML) 0.083% IN NEBU
3.0000 mL | INHALATION_SOLUTION | RESPIRATORY_TRACT | Status: DC | PRN
  Administered 2019-06-30: 12:00:00 3 mL via RESPIRATORY_TRACT
  Filled 2019-06-29: qty 3

## 2019-06-29 MED ORDER — ENOXAPARIN SODIUM 300 MG/3ML IJ SOLN
20.0000 mg | INTRAMUSCULAR | Status: DC
  Administered 2019-06-29 – 2019-06-30 (×2): 20 mg via SUBCUTANEOUS
  Filled 2019-06-29 (×3): qty 0.2

## 2019-06-29 MED ORDER — ENSURE ENLIVE PO LIQD
237.0000 mL | Freq: Four times a day (QID) | ORAL | Status: DC
  Administered 2019-06-29 – 2019-07-01 (×6): 237 mL via ORAL
  Filled 2019-06-29: qty 237

## 2019-06-29 MED ORDER — PANCRELIPASE (LIP-PROT-AMYL) 12000-38000 UNITS PO CPEP
24000.0000 [IU] | ORAL_CAPSULE | Freq: Four times a day (QID) | ORAL | Status: DC
  Administered 2019-06-29 – 2019-07-01 (×7): 24000 [IU] via ORAL
  Filled 2019-06-29 (×9): qty 2

## 2019-06-29 MED ORDER — PREGABALIN 75 MG PO CAPS
150.0000 mg | ORAL_CAPSULE | Freq: Two times a day (BID) | ORAL | Status: DC
  Administered 2019-06-29 – 2019-07-01 (×4): 150 mg via ORAL
  Filled 2019-06-29 (×4): qty 2

## 2019-06-29 MED ORDER — METHYLPREDNISOLONE SODIUM SUCC 40 MG IJ SOLR
40.0000 mg | Freq: Four times a day (QID) | INTRAMUSCULAR | Status: AC
  Administered 2019-06-29 (×2): 40 mg via INTRAVENOUS
  Filled 2019-06-29 (×2): qty 1

## 2019-06-29 MED ORDER — PREDNISONE 20 MG PO TABS
40.0000 mg | ORAL_TABLET | Freq: Every day | ORAL | Status: DC
  Administered 2019-06-30 – 2019-07-01 (×2): 40 mg via ORAL
  Filled 2019-06-29 (×2): qty 2

## 2019-06-29 MED ORDER — IPRATROPIUM-ALBUTEROL 0.5-2.5 (3) MG/3ML IN SOLN
3.0000 mL | Freq: Four times a day (QID) | RESPIRATORY_TRACT | Status: DC
  Administered 2019-06-29 (×2): 3 mL via RESPIRATORY_TRACT
  Filled 2019-06-29 (×2): qty 3

## 2019-06-29 MED ORDER — IPRATROPIUM-ALBUTEROL 0.5-2.5 (3) MG/3ML IN SOLN
3.0000 mL | Freq: Three times a day (TID) | RESPIRATORY_TRACT | Status: DC
  Administered 2019-06-30 – 2019-07-01 (×5): 3 mL via RESPIRATORY_TRACT
  Filled 2019-06-29 (×5): qty 3

## 2019-06-29 MED ORDER — POLYETHYLENE GLYCOL 3350 17 G PO PACK: 17.0000 g | PACK | Freq: Every day | ORAL | Status: DC | PRN

## 2019-06-29 MED ORDER — ACETAMINOPHEN 500 MG PO TABS: 500.0000 mg | ORAL_TABLET | Freq: Four times a day (QID) | ORAL | Status: DC | PRN

## 2019-06-29 MED ORDER — PROCHLORPERAZINE EDISYLATE 10 MG/2ML IJ SOLN
5.0000 mg | INTRAMUSCULAR | Status: DC | PRN
  Administered 2019-06-29: 19:00:00 5 mg via INTRAVENOUS
  Filled 2019-06-29: qty 2

## 2019-06-29 MED ORDER — TIZANIDINE HCL 2 MG PO TABS
2.0000 mg | ORAL_TABLET | Freq: Every day | ORAL | Status: DC
  Administered 2019-06-29 – 2019-06-30 (×2): 2 mg via ORAL
  Filled 2019-06-29 (×3): qty 1

## 2019-06-29 MED ORDER — ALBUTEROL SULFATE HFA 108 (90 BASE) MCG/ACT IN AERS
8.0000 | INHALATION_SPRAY | Freq: Once | RESPIRATORY_TRACT | Status: AC
  Administered 2019-06-29: 07:00:00 8 via RESPIRATORY_TRACT
  Filled 2019-06-29: qty 6.7

## 2019-06-29 MED ORDER — AMLODIPINE BESYLATE 5 MG PO TABS
2.5000 mg | ORAL_TABLET | Freq: Every day | ORAL | Status: DC
  Administered 2019-06-30 – 2019-07-01 (×2): 2.5 mg via ORAL
  Filled 2019-06-29 (×2): qty 1

## 2019-06-29 MED ORDER — PANTOPRAZOLE SODIUM 40 MG PO TBEC
40.0000 mg | DELAYED_RELEASE_TABLET | Freq: Every day | ORAL | Status: DC
  Administered 2019-06-29 – 2019-07-01 (×3): 40 mg via ORAL
  Filled 2019-06-29 (×3): qty 1

## 2019-06-29 MED ORDER — AMITRIPTYLINE HCL 10 MG PO TABS
10.0000 mg | ORAL_TABLET | Freq: Every day | ORAL | Status: DC
  Administered 2019-06-29 – 2019-06-30 (×2): 10 mg via ORAL
  Filled 2019-06-29 (×3): qty 1

## 2019-06-29 MED ORDER — METHYLPREDNISOLONE SODIUM SUCC 125 MG IJ SOLR
125.0000 mg | Freq: Once | INTRAMUSCULAR | Status: AC
  Administered 2019-06-29: 07:00:00 125 mg via INTRAVENOUS
  Filled 2019-06-29: qty 2

## 2019-06-29 MED ORDER — ACETAMINOPHEN 650 MG RE SUPP: 650.0000 mg | Freq: Three times a day (TID) | RECTAL | Status: DC | PRN

## 2019-06-29 MED ORDER — MIRTAZAPINE 15 MG PO TABS
15.0000 mg | ORAL_TABLET | Freq: Every day | ORAL | Status: DC
  Administered 2019-06-29 – 2019-06-30 (×2): 15 mg via ORAL
  Filled 2019-06-29 (×2): qty 1

## 2019-06-29 NOTE — ED Notes (Signed)
Pt requesting pain medication; MD aware.

## 2019-06-29 NOTE — ED Notes (Signed)
ED TO INPATIENT HANDOFF REPORT  Name/Age/Gender Claudia Wiley 63 y.o. female  Code Status    Code Status Orders  (From admission, onward)         Start     Ordered   06/29/19 0944  Full code  Continuous     06/29/19 0947        Code Status History    Date Active Date Inactive Code Status Order ID Comments User Context   04/02/2019 2138 04/07/2019 1856 Full Code [id number]  Jani Gravel, MD ED   06/15/2018 2152 06/18/2018 2205 Full Code [id number]  Rise Patience, MD ED   Advance Care Planning Activity      Home/SNF/Other Home  Chief Complaint Acute on chronic respiratory failure with hypoxia and hypercapnia (HCC) [J96.21, J96.22]  Level of Care/Admitting Diagnosis ED Disposition    ED Disposition Condition [facility] Area: [place] [[id number]]  Level of Care: Telemetry [5]  Admit to tele based on following criteria: Monitor for Ischemic changes  Covid Evaluation: Asymptomatic Screening Protocol (No Symptoms)  Diagnosis: Acute on chronic respiratory failure with hypoxia and hypercapnia [facility]) [[id number]]  Admitting Physician: Reubin Milan [[id number]]  Attending Physician: Reubin Milan [[id number]]       Medical History Past Medical History:  Diagnosis Date  . Anemia   . Arthritis   . Cancer [facility]) 2013   Upper left lung and stomach cancer  . COPD (chronic obstructive pulmonary disease) ([place])   . Esophageal stricture   . Family history of brain cancer   . Family history of melanoma   . Gastrinoma   . Gastrinoma   . GERD (gastroesophageal reflux disease)   . Hypertension   . Osteoporosis   . Pancreatic insufficiency   . Pneumonia   . Vitamin D deficiency   . Weight loss, unintentional     Allergies Allergies  Allergen Reactions  . Azithromycin   . Gabapentin     IV Location/Drains/Wounds Patient Lines/Drains/Airways Status   Active Line/Drains/Airways    Name:   Placement date:   Placement time:   Site:    Days:   Peripheral IV 06/29/19 Left;Anterior Forearm   06/29/19    1905    Forearm   less than 1          Labs/Imaging Results for orders placed or performed during the hospital encounter of 06/29/19 (from the past 48 hour(s))  Blood gas, venous     Status: Abnormal   Collection Time: 06/29/19  7:23 AM  Result Value Ref Range   pH, Ven 7.263 7.250 - 7.430   pCO2, Ven 80.8 (HH) 44.0 - 60.0 mmHg    Comment: CRITICAL RESULT CALLED TO, READ BACK BY AND VERIFIED WITH: THOMPSON, C. @ 0813 06/29/2019 PERRY, J.    pO2, Ven <31.0 (LL) 32.0 - 45.0 mmHg    Comment: CRITICAL RESULT CALLED TO, READ BACK BY AND VERIFIED WITH: THOMPSON, C. @ 0813 06/29/2019 PERRY,J.    Bicarbonate 35.3 (H) 20.0 - 28.0 mmol/L   Acid-Base Excess 5.4 (H) 0.0 - 2.0 mmol/L   O2 Saturation 41.1 %   Patient temperature 98.6     Comment: Performed at [facility], [place] [address] [place], [place] [address]  CBC with Differential     Status: Abnormal   Collection Time: 06/29/19  7:23 AM  Result Value Ref Range   WBC 9.4 4.0 - 10.5 K/uL   RBC 5.03 3.87 - 5.11 MIL/uL  Hemoglobin 14.0 12.0 - 15.0 g/dL   HCT 46.0 36.0 - 46.0 %   MCV 91.5 80.0 - 100.0 fL   MCH 27.8 26.0 - 34.0 pg   MCHC 30.4 30.0 - 36.0 g/dL   RDW 15.9 (H) 11.5 - 15.5 %   Platelets 275 150 - 400 K/uL   nRBC 0.0 0.0 - 0.2 %   Neutrophils Relative % 67 %   Neutro Abs 6.3 1.7 - 7.7 K/uL   Lymphocytes Relative 23 %   Lymphs Abs 2.2 0.7 - 4.0 K/uL   Monocytes Relative 9 %   Monocytes Absolute 0.9 0.1 - 1.0 K/uL   Eosinophils Relative 0 %   Eosinophils Absolute 0.0 0.0 - 0.5 K/uL   Basophils Relative 0 %   Basophils Absolute 0.0 0.0 - 0.1 K/uL   Immature Granulocytes 1 %   Abs Immature Granulocytes 0.09 (H) 0.00 - 0.07 K/uL    Comment: Performed at [facility], [place] [address] [place], [place] [address]  Basic metabolic panel     Status: Abnormal   Collection Time: 06/29/19  7:23 AM  Result Value Ref Range    Sodium 139 135 - 145 mmol/L   Potassium 4.6 3.5 - 5.1 mmol/L   Chloride 93 (L) 98 - 111 mmol/L   CO2 34 (H) 22 - 32 mmol/L   Glucose, Bld 82 70 - 99 mg/dL   BUN 19 8 - 23 mg/dL   Creatinine, Ser 0.78 0.44 - 1.00 mg/dL   Calcium 9.3 8.9 - 10.3 mg/dL   GFR calc non Af Amer >60 >60 mL/min   GFR calc Af Amer >60 >60 mL/min   Anion gap 12 5 - 15    Comment: Performed at [facility], [place] [address] [place], [place] [address]  Phosphorus     Status: None   Collection Time: 06/29/19  7:23 AM  Result Value Ref Range   Phosphorus 4.1 2.5 - 4.6 mg/dL    Comment: Performed at [facility], [place] [address] [place], [place] [address]  Magnesium     Status: None   Collection Time: 06/29/19  7:23 AM  Result Value Ref Range   Magnesium 1.8 1.7 - 2.4 mg/dL    Comment: Performed at [facility], [place] [address], [place], [place] [address]  Hepatic function panel     Status: None   Collection Time: 06/29/19  7:23 AM  Result Value Ref Range   Total Protein 6.6 6.5 - 8.1 g/dL   Albumin 3.8 3.5 - 5.0 g/dL   AST 32 15 - 41 U/L   ALT 21 0 - 44 U/L   Alkaline Phosphatase 95 38 - 126 U/L   Total Bilirubin 0.6 0.3 - 1.2 mg/dL   Bilirubin, Direct 0.2 0.0 - 0.2 mg/dL   Indirect Bilirubin 0.4 0.3 - 0.9 mg/dL    Comment: Performed at [facility], [place] [address], [place], [place] [address]  POC SARS Coronavirus 2 Ag-ED - Nasal Swab (BD Veritor Kit)     Status: None   Collection Time: 06/29/19  8:07 AM  Result Value Ref Range   SARS Coronavirus 2 Ag NEGATIVE NEGATIVE    Comment: (NOTE) SARS-CoV-2 antigen NOT DETECTED.  Negative results are presumptive.  Negative results do not preclude SARS-CoV-2 infection and should not be used as the sole basis for treatment or other patient management decisions, including infection  control decisions, particularly in the presence of clinical signs and  symptoms consistent with COVID-19, or  in those who have been in contact with the virus.  Negative results must be combined with clinical observations, patient history, and epidemiological information. The expected result is Negative. Fact Sheet for Patients: PodPark.tn Fact Sheet for Healthcare Providers: GiftContent.is This test is not yet approved or cleared by the [place] FDA and  has been authorized for detection and/or diagnosis of SARS-CoV-2 by FDA under an Emergency Use Authorization (EUA).  This EUA will remain in effect (meaning this test can be used) for the duration of  the COVID-19 de claration under Section 564(b)(1) of the Act, 21 U.S.C. section 360bbb-3(b)(1), unless the authorization is terminated or revoked sooner.   SARS CORONAVIRUS 2 (TAT 6-24 HRS) Nasopharyngeal Nasopharyngeal Swab     Status: None   Collection Time: 06/29/19  8:32 AM   Specimen: Nasopharyngeal Swab  Result Value Ref Range   SARS Coronavirus 2 NEGATIVE NEGATIVE    Comment: (NOTE) SARS-CoV-2 target nucleic acids are NOT DETECTED. The SARS-CoV-2 RNA is generally detectable in upper and lower respiratory specimens during the acute phase of infection. Negative results do not preclude SARS-CoV-2 infection, do not rule out co-infections with other pathogens, and should not be used as the sole basis for treatment or other patient management decisions. Negative results must be combined with clinical observations, patient history, and epidemiological information. The expected result is Negative. Fact Sheet for Patients: SugarRoll.be Fact Sheet for Healthcare Providers: https://www.woods-mathews.com/ This test is not yet approved or cleared by the [place] FDA and  has been authorized for detection and/or diagnosis of SARS-CoV-2 by FDA under an Emergency Use Authorization (EUA). This EUA will remain  in effect (meaning this test can  be used) for the duration of the COVID-19 declaration under Section 56 4(b)(1) of the Act, 21 U.S.C. section 360bbb-3(b)(1), unless the authorization is terminated or revoked sooner. Performed at [facility], [place] [address] [place], [place] [address]    DG Chest Port 1 View  Result Date: 06/29/2019 CLINICAL DATA:  Shortness of breath EXAM: PORTABLE CHEST 1 VIEW COMPARISON:  04/02/2019 FINDINGS: Hyperinflation with emphysematous appearance. There is asymmetric lucency on the left where there is lung sutures and costophrenic sulcus scarring. There is no edema, consolidation, effusion, or pneumothorax. Normal heart size and mediastinal contours. IMPRESSION: 1. No acute finding. 2. COPD. Electronically Signed   By: Monte Fantasia M.D.   On: 06/29/2019 07:39    Pending Labs Unresulted Labs (From admission, onward)    Start     Ordered   07/06/19 0500  Creatinine, serum  (enoxaparin (LOVENOX)    CrCl < 30 ml/min)  Weekly,   R    Comments: while on enoxaparin therapy.    06/29/19 0947   06/30/19 0500  HIV Antibody (routine testing w rflx)  (HIV Antibody (Routine testing w reflex) panel)  Tomorrow morning,   R     06/29/19 0939   06/30/19 8937  Basic metabolic panel  Tomorrow morning,   R     06/29/19 0947   06/30/19 0500  CBC  Tomorrow morning,   R     06/29/19 0947          Vitals/Pain Today's Vitals   06/29/19 1743 06/29/19 1815 06/29/19 1830 06/29/19 1924  BP: (!) 127/97 101/86 124/78   Pulse: (!) 122 (!) 103 (!) 107   Resp: 18 17 16    Temp:      TempSrc:      SpO2: 95% 100%  100% 100%  Weight:      Height:      PainSc:        Isolation Precautions No active isolations  Medications Medications  methylPREDNISolone sodium succinate (SOLU-MEDROL) 40 mg/mL injection 40 mg (40 mg Intravenous Given 06/29/19 1613)    Followed by  predniSONE (DELTASONE) tablet 40 mg (has no administration in time range)  albuterol (PROVENTIL) (2.5 MG/3ML) 0.083% nebulizer solution 3  mL (has no administration in time range)  enoxaparin (LOVENOX) injection 20 mg (20 mg Subcutaneous Not Given 06/29/19 1355)  acetaminophen (TYLENOL) tablet 500 mg (has no administration in time range)    Or  acetaminophen (TYLENOL) suppository 650 mg (has no administration in time range)  prochlorperazine (COMPAZINE) injection 5 mg (5 mg Intravenous Given 06/29/19 1836)  amitriptyline (ELAVIL) tablet 10 mg (has no administration in time range)  amLODipine (NORVASC) tablet 2.5 mg (has no administration in time range)  feeding supplement (ENSURE ENLIVE) (ENSURE ENLIVE) liquid 237 mL (has no administration in time range)  lipase/protease/amylase (CREON) capsule 24,000 Units (has no administration in time range)  mirtazapine (REMERON) tablet 15 mg (has no administration in time range)  pantoprazole (PROTONIX) EC tablet 40 mg (40 mg Oral Given 06/29/19 1725)  polyethylene glycol (MIRALAX / GLYCOLAX) packet 17 g (has no administration in time range)  pregabalin (LYRICA) capsule 150 mg (has no administration in time range)  tiZANidine (ZANAFLEX) tablet 2 mg (has no administration in time range)  ipratropium-albuterol (DUONEB) 0.5-2.5 (3) MG/3ML nebulizer solution 3 mL (has no administration in time range)  methylPREDNISolone sodium succinate (SOLU-MEDROL) 125 mg/2 mL injection 125 mg (125 mg Intravenous Given 06/29/19 0724)  magnesium sulfate IVPB 2 g 50 mL (0 g Intravenous Stopped 06/29/19 0857)  albuterol (VENTOLIN HFA) 108 (90 Base) MCG/ACT inhaler 8 puff (8 puffs Inhalation Given 06/29/19 0725)  ketorolac (TORADOL) 15 MG/ML injection 15 mg (15 mg Intravenous Given 06/29/19 1725)    Mobility manual wheelchair

## 2019-06-29 NOTE — ED Notes (Signed)
Pts o2 sat dropped to 85%-86% on RA. Pt placed back on 3L via [place]. Sat on 3L 95%

## 2019-06-29 NOTE — ED Notes (Signed)
IV started in Left Ac. Meds given. Blood work taken. Full rainbow and extra save tubes collected.

## 2019-06-29 NOTE — ED Provider Notes (Signed)
[facility] DEPT Provider Note   CSN: [id number] Arrival date & time: 06/29/19  [id number]     History Chief Complaint  Patient presents with  . Respiratory Distress    Claudia Wiley is a 63 y.o. female.  63 yo F with a chief complaints of shortness of breath.  Going on for the past 48 hours.  She has had increased cough increased sputum production and has had a subjective fever.  Was discharged from the hospital back in September with oxygen but has been evaluated more recently and told she no longer needed.  Has been continue to use her breathing treatments as prescribed.  Needing a more often over the past 48 hours.  She received breathing treatments in route with EMS and had some improvement.  Thinks this feels like her prior COPD.  Denies chest pain or pressure.  The history is provided by the patient.  Illness Severity:  Moderate Onset quality:  Sudden Duration:  2 days Timing:  Constant Progression:  Worsening Chronicity:  New Associated symptoms: cough, fever and shortness of breath   Associated symptoms: no chest pain, no congestion, no headaches, no myalgias, no nausea, no rhinorrhea, no vomiting and no wheezing        Past Medical History:  Diagnosis Date  . Anemia   . Arthritis   . Cancer [facility]) 2013   Upper left lung and stomach cancer  . COPD (chronic obstructive pulmonary disease) ([place])   . Esophageal stricture   . Family history of brain cancer   . Family history of melanoma   . Gastrinoma   . Gastrinoma   . GERD (gastroesophageal reflux disease)   . Hypertension   . Osteoporosis   . Pancreatic insufficiency   . Pneumonia   . Vitamin D deficiency   . Weight loss, unintentional     Patient Active Problem List   Diagnosis Date Noted  . Nausea & vomiting 04/03/2019  . Abdominal pain 04/03/2019  . Abnormal finding on urinalysis 04/03/2019  . Acute respiratory failure with hypoxia and hypercapnia ([place]) 04/02/2019  . COPD  exacerbation ([place]) 06/15/2018  . Essential hypertension 06/15/2018  . Genetic testing 06/01/2018  . Gastrinoma   . Family history of melanoma   . Family history of brain cancer   . Hypokalemia 10/19/2011  . Severe protein-calorie malnutrition ([place]) 10/19/2011  . Weight loss 10/19/2011  . Generalized weakness 10/19/2011  . Dysphagia 10/19/2011  . Anodontia 10/19/2011    Past Surgical History:  Procedure Laterality Date  . ABDOMINAL HYSTERECTOMY    . BREAST LUMPECTOMY    . CESAREAN SECTION    . LUNG SURGERY       OB History   No obstetric history on file.     Family History  Problem Relation Age of Onset  . Diabetes Sister        x 3  . Cancer Sister 76       brain that metastized to bone, stomach and other organs  . Melanoma Maternal Aunt   . Heart attack Father   . Pneumonia Brother   . Cancer Other 17       cancer in his chest - nephew  . Colon cancer Neg Hx   . Pancreatic cancer Neg Hx   . Rectal cancer Neg Hx   . Esophageal cancer Neg Hx     Social History   Tobacco Use  . Smoking status: Former Smoker    Packs/day: 0.25    Years:  2.00    Pack years: 0.50    Types: Cigarettes    Quit date: 05/30/2019    Years since quitting: 0.0  . Smokeless tobacco: Never Used  . Tobacco comment: Not ready  Substance Use Topics  . Alcohol use: No  . Drug use: No    Home Medications Prior to Admission medications   Medication Sig Start Date End Date Taking? Authorizing Provider  acetaminophen (TYLENOL) 500 MG tablet Take 1,000 mg by mouth every 6 (six) hours as needed for moderate pain or headache.    [provider]  albuterol (PROVENTIL HFA;VENTOLIN HFA) 108 (90 Base) MCG/ACT inhaler Inhale into the lungs every 6 (six) hours as needed for wheezing or shortness of breath.    [provider]  albuterol (PROVENTIL HFA;VENTOLIN HFA) 108 (90 Base) MCG/ACT inhaler Inhale 2 puffs into the lungs every 6 (six) hours as needed for wheezing or shortness of  breath. Patient not taking: Reported on 04/02/2019 06/18/18   Regalado, Jerald Kief A, MD  amitriptyline (ELAVIL) 10 MG tablet Take 10 mg by mouth at bedtime.    [provider]  amLODipine (NORVASC) 2.5 MG tablet Take 2.5 mg by mouth daily.    [provider]  budesonide-formoterol (SYMBICORT) 160-4.5 MCG/ACT inhaler Inhale 1 puff into the lungs daily. 06/18/18   Regalado, Belkys A, MD  cholecalciferol (VITAMIN D) 400 units TABS tablet Take 800 Units by mouth.    [provider]  ENSURE (ENSURE) Take 237 mLs by mouth 3 (three) times daily between meals.    [provider]  guaiFENesin (MUCINEX) 600 MG 12 hr tablet Take 1 tablet (600 mg total) by mouth 2 (two) times daily. Patient not taking: Reported on 04/02/2019 06/18/18   Regalado, Jerald Kief A, MD  lipase/protease/amylase (CREON) 12000 units CPEP capsule Take 24,000 Units by mouth 2 (two) times daily.     [provider]  mirtazapine (REMERON) 15 MG tablet Take 15 mg by mouth at bedtime.    [provider]  Multiple Vitamin (MULTIVITAMIN) tablet Take 1 tablet by mouth daily.    [provider]  pantoprazole (PROTONIX) 40 MG tablet Take 40 mg by mouth daily.    [provider]  polyethylene glycol (MIRALAX / GLYCOLAX) 17 g packet Take 17 g by mouth daily as needed for moderate constipation. 04/06/19   Desiree Hane, MD  pregabalin (LYRICA) 150 MG capsule Take 150 mg by mouth 2 (two) times daily.    [provider]  tiotropium (SPIRIVA) 18 MCG inhalation capsule Place 18 mcg into inhaler and inhale daily.    [provider]  tiZANidine (ZANAFLEX) 4 MG capsule Take 2 mg by mouth at bedtime.     [provider]    Allergies    Azithromycin and Gabapentin  Review of Systems   Review of Systems  Constitutional: Positive for fever. Negative for chills.  HENT: Negative for congestion and rhinorrhea.   Eyes: Negative for redness and visual disturbance.    Respiratory: Positive for cough and shortness of breath. Negative for wheezing.   Cardiovascular: Negative for chest pain and palpitations.  Gastrointestinal: Negative for nausea and vomiting.  Genitourinary: Negative for dysuria and urgency.  Musculoskeletal: Negative for arthralgias and myalgias.  Skin: Negative for pallor and wound.  Neurological: Negative for dizziness and headaches.    Physical Exam Updated Vital Signs BP 118/76   Pulse (!) 111   Temp 97.6 F (36.4 C) (Oral)   Resp 13   SpO2 100%  Physical Exam Vitals and nursing note reviewed.  Constitutional:      General: She is not in acute distress.    Appearance: She is well-developed. She is not diaphoretic.  HENT:     Head: Normocephalic and atraumatic.  Eyes:     Pupils: Pupils are equal, round, and reactive to light.  Cardiovascular:     Rate and Rhythm: Regular rhythm. Tachycardia present.     Heart sounds: No murmur. No friction rub. No gallop.   Pulmonary:     Effort: Pulmonary effort is normal.     Breath sounds: Wheezing present. No rales.     Comments: Diminished breath sounds in all lung fields Abdominal:     General: There is no distension.     Palpations: Abdomen is soft.     Tenderness: There is no abdominal tenderness.  Musculoskeletal:        General: No tenderness.     Cervical back: Normal range of motion and neck supple.  Skin:    General: Skin is warm and dry.  Neurological:     Mental Status: She is alert and oriented to person, place, and time.  Psychiatric:        Behavior: Behavior normal.     ED Results / Procedures / Treatments   Labs (all labs ordered are listed, but only abnormal results are displayed) Labs Reviewed  BLOOD GAS, VENOUS  CBC WITH DIFFERENTIAL/PLATELET  BASIC METABOLIC PANEL  POC SARS CORONAVIRUS 2 AG -  ED    EKG EKG Interpretation  Date/Time:  Tuesday June 29 2019 07:12:40 EST Ventricular Rate:  116 PR Interval:    QRS Duration: 75 QT  Interval:  431 QTC Calculation: 591 R Axis:   83 Text Interpretation: Sinus tachycardia Low voltage, extremity and precordial leads Anteroseptal infarct, old ST elevation, consider inferior injury Prolonged QT interval No significant change since last tracing Confirmed by Blanchie Dessert [id number]) on 06/29/2019 7:27:26 AM   Radiology DG Chest Port 1 View  Result Date: 06/29/2019 CLINICAL DATA:  Shortness of breath EXAM: PORTABLE CHEST 1 VIEW COMPARISON:  04/02/2019 FINDINGS: Hyperinflation with emphysematous appearance. There is asymmetric lucency on the left where there is lung sutures and costophrenic sulcus scarring. There is no edema, consolidation, effusion, or pneumothorax. Normal heart size and mediastinal contours. IMPRESSION: 1. No acute finding. 2. COPD. Electronically Signed   By: Monte Fantasia M.D.   On: 06/29/2019 07:39    Procedures Procedures (including critical care time)  Medications Ordered in ED Medications  methylPREDNISolone sodium succinate (SOLU-MEDROL) 125 mg/2 mL injection 125 mg (125 mg Intravenous Given 06/29/19 0724)  magnesium sulfate IVPB 2 g 50 mL (2 g Intravenous New Bag/Given 06/29/19 0726)  albuterol (VENTOLIN HFA) 108 (90 Base) MCG/ACT inhaler 8 puff (8 puffs Inhalation Given 06/29/19 0725)    ED Course  I have reviewed the triage vital signs and the nursing notes.  Pertinent labs & imaging results that were available during my care of the patient were reviewed by me and considered in my medical decision making (see chart for details).    MDM Rules/Calculators/A&P                      63 yo F with a cc of sob.  This been going on for about 48 hours.  Feels like her prior COPD exacerbations.  Has had some improvement with breathing treatments in route.  Will give 8 more puffs albuterol.  Titrate down her oxygen as  best we can.  Signed out to Dr. Maryan Rued, please see her note for further details of care in the ED.   CRITICAL CARE Performed by:  Cecilio Asper   Total critical care time: 35 minutes  Critical care time was exclusive of separately billable procedures and treating other patients.  Critical care was necessary to treat or prevent imminent or life-threatening deterioration.  Critical care was time spent personally by me on the following activities: development of treatment plan with patient and/or surrogate as well as nursing, discussions with consultants, evaluation of patient's response to treatment, examination of patient, obtaining history from patient or surrogate, ordering and performing treatments and interventions, ordering and review of laboratory studies, ordering and review of radiographic studies, pulse oximetry and re-evaluation of patient's condition.   Final Clinical Impression(s) / ED Diagnoses Final diagnoses:  COPD exacerbation ([place])  Acute on chronic respiratory failure with hypoxia [facility])    Rx / DC Orders ED Discharge Orders    None       Deno Etienne, DO 06/29/19 [phone]

## 2019-06-29 NOTE — ED Provider Notes (Signed)
Assumed from Dr. Tyrone Nine at 7:30 AM.  Patient's labs are reassuring except for a venous gas with a PCO2 of 80.  Patient's last PCO2 from a few months ago was 75.  pH is still normal and patient appears to be compensating.  On repeat evaluation patient is now on 3 L of oxygen has minimal wheezing and states she is feeling much better.  When oxygen was removed patient dropped to 85% on room air and was placed back on 3 L.  Plan for admission for ongoing COPD exacerbation.   Blanchie Dessert, MD 06/29/19 [phone]

## 2019-06-29 NOTE — ED Notes (Signed)
CRITICAL VALUE ALERT  Critical Value:  PCO2:80.8; PO2: less than 31  Date & Time Notied:  06/29/2019, [id number]  Provider Notified: Maryan Rued MD   Orders Received/Actions taken: Provider Aware

## 2019-06-29 NOTE — ED Notes (Signed)
Provided patient a bedside commode for comfort to urinate.

## 2019-06-29 NOTE — ED Triage Notes (Signed)
Pt arrived via EMS. Pt has hx of COPD and has been having respiratory distress last night. EMS reports wheezing in all lung fields. Pt's original SpO2 was 66 on RA, it is now 100 12L

## 2019-06-29 NOTE — ED Notes (Signed)
Per floyd, MD call resp for high flow [place].

## 2019-06-29 NOTE — ED Notes (Signed)
Pt on 3L [place], with sats at 100%

## 2019-06-29 NOTE — ED Notes (Signed)
Pt provided with lunch tray.

## 2019-06-29 NOTE — ED Notes (Signed)
Pharmacy notified the need to verify medication

## 2019-06-29 NOTE — H&P (Signed)
History and Physical    Claudia Wiley [id number] DOB: 1956/05/04 DOA: 06/29/2019  PCP: System, Pcp Not In   Patient coming from: Home.  I have personally briefly reviewed patient's old medical records in [facility]  Chief Complaint: Shortness of breath.  HPI: Claudia Wiley is a 63 y.o. female with medical history significant of anemia, osteoarthritis, history of upper left long and stomach cancer, esophageal stricture, gastrinoma, GERD, pancreatic insufficiency, osteoporosis, vitamin D deficiency, malnutrition with unintentional weight loss, COPD, history of pneumonia who was discharged home on oxygen on her last admission on 04/07/2019, but states that on her last visit to the [place] earlier this month, her oxygen was discontinued.  Since then, she has had progressively worse fatigue and has been very dyspneic over the past few days despite using her inhalers at home.  Early this morning, she felt so dyspneic that she had to call EMS.  When EMS arrived to see her her O2 sat was 66% on room air.  Her lungs had wheezing throughout and sounded very diminished.  Her O2 sat quickly corrected to 100% on NRB mask at 12 LPM.  She states that she has felt fever at home and some chills, but has not taken her temperature.  She has had increased sputum production, but does state that it is mostly clear.  She denies rhinorrhea, sore throat or hemoptysis.  She denies nasal congestion, chest pain, dizziness, nausea or emesis, diaphoresis, PND, lower extremity edema or orthopnea.  She denies abdominal pain, diarrhea, constipation, melena or hematochezia.  No dysuria, frequency or hematuria.  No polyuria, polydipsia, polyphagia or blurred vision.  ED Course: Initial vital signs temperature 97.6 F, pulse 120, respirations 25, blood pressure 130/88 mmHg and O2 sat 100% on NRB mask.  Most recently, the patient has been saturating from 95 to 100% on 3 LPM via .  She received 125 mg of Solu-Medrol, magnesium sulfate 2  g IVPB and a puffs of albuterol MDI.  Her VBG showed a pH of 7.263, PCO2 80 point a and PO2 less than 31.0 mmHg.  Her bicarbonate was 35.3 and acid base excess was 5.4 mmol/L.  CBC showed a white count was 9.4, hemoglobin 14.0 g/dL and platelets 275.  Her LFTs were normal.  BMP shows a chloride of 93 and CO2 of 34 mmol/L.  She had normal sodium, potassium, calcium glucose and renal function.  Her chest radiograph showed chronic COPD changes, but did not have any acute cardiopulmonary pathology.  Review of Systems: As per HPI otherwise 10 point review of systems negative.   Past Medical History:  Diagnosis Date   Anemia    Arthritis    Cancer ([place]) 2013   Upper left lung and stomach cancer   COPD (chronic obstructive pulmonary disease) ([place])    Esophageal stricture    Family history of brain cancer    Family history of melanoma    Gastrinoma    Gastrinoma    GERD (gastroesophageal reflux disease)    Hypertension    Osteoporosis    Pancreatic insufficiency    Pneumonia    Vitamin D deficiency    Weight loss, unintentional     Past Surgical History:  Procedure Laterality Date   ABDOMINAL HYSTERECTOMY     BREAST LUMPECTOMY     CESAREAN SECTION     LUNG SURGERY       reports that she quit smoking about 4 weeks ago. Her smoking use included cigarettes. She has a 0.50 pack-year  smoking history. She has never used smokeless tobacco. She reports that she does not drink alcohol or use drugs.  Allergies  Allergen Reactions   Azithromycin    Gabapentin     Family History  Problem Relation Age of Onset   Diabetes Sister        x 3   Cancer Sister 64       brain that metastized to bone, stomach and other organs   Melanoma Maternal Aunt    Heart attack Father    Pneumonia Brother    Cancer Other 28       cancer in his chest - nephew   Colon cancer Neg Hx    Pancreatic cancer Neg Hx    Rectal cancer Neg Hx    Esophageal cancer Neg Hx    Prior  to Admission medications   Medication Sig Start Date End Date Taking? Authorizing Provider  acetaminophen (TYLENOL) 500 MG tablet Take 1,000 mg by mouth every 6 (six) hours as needed for moderate pain or headache.   Yes [provider]  albuterol (PROVENTIL HFA;VENTOLIN HFA) 108 (90 Base) MCG/ACT inhaler Inhale into the lungs every 6 (six) hours as needed for wheezing or shortness of breath.   Yes [provider]  amitriptyline (ELAVIL) 10 MG tablet Take 10 mg by mouth at bedtime.   Yes [provider]  amLODipine (NORVASC) 2.5 MG tablet Take 2.5 mg by mouth daily.   Yes [provider]  budesonide-formoterol (SYMBICORT) 160-4.5 MCG/ACT inhaler Inhale 1 puff into the lungs daily. 06/18/18  Yes Regalado, Belkys A, MD  cholecalciferol (VITAMIN D) 400 units TABS tablet Take 800 Units by mouth.   Yes [provider]  ENSURE (ENSURE) Take 237 mLs by mouth 4 (four) times daily. Between Meals.   Yes [provider]  lipase/protease/amylase (CREON) 12000 units CPEP capsule Take 24,000 Units by mouth 4 (four) times daily.    Yes [provider]  mirtazapine (REMERON) 15 MG tablet Take 15 mg by mouth at bedtime.   Yes [provider]  Multiple Vitamin (MULTIVITAMIN) tablet Take 1 tablet by mouth daily.   Yes [provider]  pantoprazole (PROTONIX) 40 MG tablet Take 40 mg by mouth daily.   Yes [provider]  polyethylene glycol (MIRALAX / GLYCOLAX) 17 g packet Take 17 g by mouth daily as needed for moderate constipation. 04/06/19  Yes Oretha Milch D, MD  pregabalin (LYRICA) 150 MG capsule Take 150 mg by mouth 2 (two) times daily.   Yes [provider]  tiotropium (SPIRIVA) 18 MCG inhalation capsule Place 18 mcg into inhaler and inhale daily.   Yes [provider]  tiZANidine (ZANAFLEX) 4 MG capsule Take 2 mg by mouth at bedtime.    Yes [provider]  albuterol (PROVENTIL HFA;VENTOLIN HFA)  108 (90 Base) MCG/ACT inhaler Inhale 2 puffs into the lungs every 6 (six) hours as needed for wheezing or shortness of breath. Patient not taking: Reported on 04/02/2019 06/18/18   Regalado, Jerald Kief A, MD  guaiFENesin (MUCINEX) 600 MG 12 hr tablet Take 1 tablet (600 mg total) by mouth 2 (two) times daily. Patient not taking: Reported on 04/02/2019 06/18/18   Elmarie Shiley, MD    Physical Exam: Vitals:   06/29/19 1115 06/29/19 1124 06/29/19 1130 06/29/19 1145  BP: 110/83  124/79 118/74  Pulse: (!) 104  (!) 103 (!) 111  Resp: 17  13 (!) 25  Temp:      TempSrc:  SpO2: 100%  100% 100%  Weight:  39.5 kg    Height:  4\' 11"  (1.499 m)      Constitutional: Looks malnourished and chronically ill, but currently in NAD, calm, comfortable Eyes: PERRL, lids and conjunctivae normal ENMT: Mucous membranes are moist. Posterior pharynx clear of any exudate or lesions. Neck: normal, supple, no masses, no thyromegaly Respiratory: Good air movement with subtle wheezing, no crackles and no rhonchi. Normal respiratory effort. No accessory muscle use.  Cardiovascular: Tachycardic at 104 bpm, no murmurs / rubs / gallops. No extremity edema. 2+ pedal pulses. No carotid bruits.  Abdomen: Soft, no tenderness, no masses palpated. No hepatosplenomegaly. Bowel sounds positive.  Musculoskeletal: no clubbing / cyanosis. Good ROM, no contractures. Normal muscle tone.  Skin: no rashes, lesions, ulcers on limited dermatological examination. Neurologic: CN 2-12 grossly intact. Sensation intact, DTR normal.  Mild generalized weakness.  Psychiatric: Normal judgment and insight. Alert and oriented x 3. Normal mood.   Labs on Admission: I have personally reviewed following labs and imaging studies  CBC: Recent Labs  Lab 06/29/19 0723  WBC 9.4  NEUTROABS 6.3  HGB 14.0  HCT 46.0  MCV 91.5  PLT 123XX123   Basic Metabolic Panel: Recent Labs  Lab 06/29/19 0723  NA 139  K 4.6  CL 93*  CO2 34*  GLUCOSE 82  BUN  19  CREATININE 0.78  CALCIUM 9.3  MG 1.8  PHOS 4.1   GFR: Estimated Creatinine Clearance: 46 mL/min (by C-G formula based on SCr of 0.78 mg/dL). Liver Function Tests: Recent Labs  Lab 06/29/19 0723  AST 32  ALT 21  ALKPHOS 95  BILITOT 0.6  PROT 6.6  ALBUMIN 3.8   No results for input(s): LIPASE, AMYLASE in the last 168 hours. No results for input(s): AMMONIA in the last 168 hours. Coagulation Profile: No results for input(s): INR, PROTIME in the last 168 hours. Cardiac Enzymes: No results for input(s): CKTOTAL, CKMB, CKMBINDEX, TROPONINI in the last 168 hours. BNP (last 3 results) No results for input(s): PROBNP in the last 8760 hours. HbA1C: No results for input(s): HGBA1C in the last 72 hours. CBG: No results for input(s): GLUCAP in the last 168 hours. Lipid Profile: No results for input(s): CHOL, HDL, LDLCALC, TRIG, CHOLHDL, LDLDIRECT in the last 72 hours. Thyroid Function Tests: No results for input(s): TSH, T4TOTAL, FREET4, T3FREE, THYROIDAB in the last 72 hours. Anemia Panel: No results for input(s): VITAMINB12, FOLATE, FERRITIN, TIBC, IRON, RETICCTPCT in the last 72 hours. Urine analysis:    Component Value Date/Time   COLORURINE YELLOW 04/02/2019 1957   APPEARANCEUR CLEAR 04/02/2019 1957   LABSPEC 1.006 04/02/2019 1957   PHURINE 6.0 04/02/2019 1957   GLUCOSEU NEGATIVE 04/02/2019 1957   HGBUR NEGATIVE 04/02/2019 [place] NEGATIVE 04/02/2019 1957   KETONESUR 20 (A) 04/02/2019 1957   PROTEINUR NEGATIVE 04/02/2019 1957   UROBILINOGEN 0.2 12/27/2012 0454   NITRITE NEGATIVE 04/02/2019 1957   LEUKOCYTESUR TRACE (A) 04/02/2019 1957    Radiological Exams on Admission: DG Chest Port 1 View  Result Date: 06/29/2019 CLINICAL DATA:  Shortness of breath EXAM: PORTABLE CHEST 1 VIEW COMPARISON:  04/02/2019 FINDINGS: Hyperinflation with emphysematous appearance. There is asymmetric lucency on the left where there is lung sutures and costophrenic sulcus  scarring. There is no edema, consolidation, effusion, or pneumothorax. Normal heart size and mediastinal contours. IMPRESSION: 1. No acute finding. 2. COPD. Electronically Signed   By: Monte Fantasia M.D.   On: 06/29/2019 07:39  EKG: Independently reviewed.  Vent. rate 116 BPM PR interval * ms QRS duration 75 ms QT/QTc 431/591 ms P-R-T axes 83 83 -70 Sinus tachycardia Low voltage, extremity and precordial leads Anteroseptal infarct, old ST elevation, consider inferior injury Prolonged QT interval No significant change since last tracing  Assessment/Plan Principal Problem:   COPD exacerbation (HCC)   Acute on chronic respiratory failure with hypoxia and hypercapnia (HCC) Has significantly improved with treatment. Observation/telemetry. Continue supplemental oxygen. Continue with scheduled and as needed bronchodilators Continue Solu-Medrol 40 mg every 6 hours x2 doses. Switch to oral prednisone in a.m. She will need to be set up with home oxygen again before discharge.  Active Problems:   Essential hypertension Continue amlodipine 2.5 mg p.o. daily.    GERD (gastroesophageal reflux disease) Continue PPI.    Pancreatic insufficiency Continue Creon before meals.    Severe protein-calorie malnutrition (HCC) Chronic issues since gastrinoma diagnosis. Pancreatic insufficiency might also be affecting it. Continue protein shakes supplementation. Follow-up with PCP.   DVT prophylaxis: Lovenox SQ. Code Status: Full code. Family Communication: Disposition Plan: 24 to 48-hour observation for COPD exacerbation treatment. Consults called: Admission status: Observation/telemetry.   Reubin Milan MD [facility]  If 7PM-7AM, please contact night-coverage [email or web address]  06/29/2019, 11:59 AM   This document was prepared using Dragon voice recognition software and may contain some unintended transcription errors.

## 2019-06-29 NOTE — ED Notes (Signed)
Daughter at bedside. Daughter requesting to have a basin, towels and cloths to wash up pt. Pts daughter denies need for help and states that she typically does it and pt prefers it

## 2019-06-30 DIAGNOSIS — E559 Vitamin D deficiency, unspecified: Secondary | ICD-10-CM | POA: Diagnosis present

## 2019-06-30 DIAGNOSIS — Z85028 Personal history of other malignant neoplasm of stomach: Secondary | ICD-10-CM | POA: Diagnosis not present

## 2019-06-30 DIAGNOSIS — Z681 Body mass index (BMI) 19 or less, adult: Secondary | ICD-10-CM | POA: Diagnosis not present

## 2019-06-30 DIAGNOSIS — Z87891 Personal history of nicotine dependence: Secondary | ICD-10-CM | POA: Diagnosis not present

## 2019-06-30 DIAGNOSIS — K8689 Other specified diseases of pancreas: Secondary | ICD-10-CM | POA: Diagnosis present

## 2019-06-30 DIAGNOSIS — K219 Gastro-esophageal reflux disease without esophagitis: Secondary | ICD-10-CM | POA: Diagnosis present

## 2019-06-30 DIAGNOSIS — J9621 Acute and chronic respiratory failure with hypoxia: Secondary | ICD-10-CM

## 2019-06-30 DIAGNOSIS — M810 Age-related osteoporosis without current pathological fracture: Secondary | ICD-10-CM | POA: Diagnosis present

## 2019-06-30 DIAGNOSIS — Z7951 Long term (current) use of inhaled steroids: Secondary | ICD-10-CM | POA: Diagnosis not present

## 2019-06-30 DIAGNOSIS — Z85118 Personal history of other malignant neoplasm of bronchus and lung: Secondary | ICD-10-CM | POA: Diagnosis not present

## 2019-06-30 DIAGNOSIS — J9622 Acute and chronic respiratory failure with hypercapnia: Secondary | ICD-10-CM | POA: Diagnosis present

## 2019-06-30 DIAGNOSIS — Z833 Family history of diabetes mellitus: Secondary | ICD-10-CM | POA: Diagnosis not present

## 2019-06-30 DIAGNOSIS — Z8249 Family history of ischemic heart disease and other diseases of the circulatory system: Secondary | ICD-10-CM | POA: Diagnosis not present

## 2019-06-30 DIAGNOSIS — R5381 Other malaise: Secondary | ICD-10-CM | POA: Diagnosis present

## 2019-06-30 DIAGNOSIS — I10 Essential (primary) hypertension: Secondary | ICD-10-CM | POA: Diagnosis present

## 2019-06-30 DIAGNOSIS — J441 Chronic obstructive pulmonary disease with (acute) exacerbation: Secondary | ICD-10-CM | POA: Diagnosis present

## 2019-06-30 DIAGNOSIS — E43 Unspecified severe protein-calorie malnutrition: Secondary | ICD-10-CM | POA: Diagnosis present

## 2019-06-30 DIAGNOSIS — Z79899 Other long term (current) drug therapy: Secondary | ICD-10-CM | POA: Diagnosis not present

## 2019-06-30 DIAGNOSIS — J449 Chronic obstructive pulmonary disease, unspecified: Secondary | ICD-10-CM | POA: Diagnosis present

## 2019-06-30 DIAGNOSIS — Z20828 Contact with and (suspected) exposure to other viral communicable diseases: Secondary | ICD-10-CM | POA: Diagnosis present

## 2019-06-30 LAB — CBC
HCT: 42.3 % (ref 36.0–46.0)
Hemoglobin: 13.3 g/dL (ref 12.0–15.0)
MCH: 27.8 pg (ref 26.0–34.0)
MCHC: 31.4 g/dL (ref 30.0–36.0)
MCV: 88.5 fL (ref 80.0–100.0)
Platelets: 276 10*3/uL (ref 150–400)
RBC: 4.78 MIL/uL (ref 3.87–5.11)
RDW: 15.5 % (ref 11.5–15.5)
WBC: 8.4 10*3/uL (ref 4.0–10.5)
nRBC: 0.2 % (ref 0.0–0.2)

## 2019-06-30 LAB — BASIC METABOLIC PANEL
Anion gap: 10 (ref 5–15)
BUN: 19 mg/dL (ref 8–23)
CO2: 33 mmol/L — ABNORMAL HIGH (ref 22–32)
Calcium: 8.5 mg/dL — ABNORMAL LOW (ref 8.9–10.3)
Chloride: 96 mmol/L — ABNORMAL LOW (ref 98–111)
Creatinine, Ser: 0.55 mg/dL (ref 0.44–1.00)
GFR calc Af Amer: >60 mL/min (ref >60)
GFR calc non Af Amer: >60 mL/min (ref >60)
Glucose, Bld: 113 mg/dL — ABNORMAL HIGH (ref 70–99)
Potassium: 4.2 mmol/L (ref 3.5–5.1)
Sodium: 139 mmol/L (ref 135–145)

## 2019-06-30 LAB — HIV ANTIBODY (ROUTINE TESTING W REFLEX): HIV Screen 4th Generation wRfx: NONREACTIVE

## 2019-06-30 MED ORDER — ADULT MULTIVITAMIN W/MINERALS CH
1.0000 | ORAL_TABLET | Freq: Every day | ORAL | Status: DC
  Administered 2019-06-30 – 2019-07-01 (×2): 1 via ORAL
  Filled 2019-06-30 (×2): qty 1

## 2019-06-30 NOTE — Progress Notes (Signed)
Informed by OT that pt started wheezing as the pt began to stand at the side of the bed. HR increased to 125 during episode. PRN neb given. No audible wheezes at this time. HR decreased to 104 at rest. Shelly Coss, MD notified. Will CTM.

## 2019-06-30 NOTE — Progress Notes (Signed)
Pt given PRN neb per eMAR. Pt had audible wheezing upon entering the room. Pt tolerated breathing tx.

## 2019-06-30 NOTE — Progress Notes (Signed)
PROGRESS NOTE    Claudia Wiley  [id number] DOB: 07-31-1955 DOA: 06/29/2019 PCP: System, Pcp Not In   Brief Narrative:  Patient is a 63 year old female with history of anemia, osteoarthritis, lung/stomach cancer, esophageal stricture, gastrinoma, GERD, pancreatic insufficiency, osteoporosis, vitamin deficiency, COPD who presents to the emergency department with complaints of progressive fatigue, dyspnea, wheezing.  When she arrived she was saturating 66% on room air.  Chest x-ray did not show pneumonia.  Covid -19 test  was negative.  She was admitted for COPD exacerbation.  Started on steroids, bronchodilators  Assessment & Plan:   Principal Problem:   Acute on chronic respiratory failure with hypoxia and hypercapnia (HCC) Active Problems:   Severe protein-calorie malnutrition (HCC)   COPD exacerbation (HCC)   Essential hypertension   GERD (gastroesophageal reflux disease)   Pancreatic insufficiency   COPD exacerbation: Started on bronchodilators, nebulization.  She is currently not on oxygen at home.  She follows at the [place].  She feels better today.  She might need oxygen on discharge.  Continue current steroids, oxygen supplementation, bronchodilators  Hypertension: Currently blood pressure stable.  Continue current regimen.  GERD: Continue PPI  Pancreatic insufficiency: Continue Creon  Severe protein calorie malnutrition: Has history of gastrinoma.  Continue nutrition supplements.  Debility/deconditioning: Patient seen by PT/OT.  Recommended home health on discharge.  Nutrition Problem: Increased nutrient needs Etiology: chronic illness(COPD)      DVT prophylaxis: Lovenox Code Status: Full Family Communication: None present at the bedside Disposition Plan: Likely home tomorrow   Consultants: None  Procedures: None  Antimicrobials:  Anti-infectives (From admission, onward)   None      Subjective: Patient seen and examined at bedside this afternoon.   Currently feels much better.  Still has shortness of breath and wheezing on auscultation.  Cough better  Objective: Vitals:   06/30/19 1200 06/30/19 1251 06/30/19 1342 06/30/19 1346  BP:   107/73   Pulse: (!) 104 91 (!) 102   Resp:   18   Temp:   98 F (36.7 C)   TempSrc:   Oral   SpO2:   96% 97%  Weight:      Height:        Intake/Output Summary (Last 24 hours) at 06/30/2019 1624 Last data filed at 06/30/2019 1253 Gross per 24 hour  Intake 360 ml  Output 0 ml  Net 360 ml   Filed Weights   06/29/19 1124  Weight: 39.5 kg    Examination:  General exam: Very deconditioned, debilitated, appears older than age HEENT:PERRL,Oral mucosa moist, Ear/Nose normal on gross exam Respiratory system: Bilateral decreased air entry, expiratory wheezes Cardiovascular system: S1 & S2 heard, RRR. No JVD, murmurs, rubs, gallops or clicks. No pedal edema. Gastrointestinal system: Abdomen is nondistended, soft and nontender. No organomegaly or masses felt. Normal bowel sounds heard. Central nervous system: Alert and oriented. No focal neurological deficits. Extremities: No edema, no clubbing ,no cyanosis, distal peripheral pulses palpable. Skin: No rashes, lesions or ulcers,no icterus ,no pallor    Data Reviewed: I have personally reviewed following labs and imaging studies  CBC: Recent Labs  Lab 06/29/19 0723 06/30/19 0547  WBC 9.4 8.4  NEUTROABS 6.3  --   HGB 14.0 13.3  HCT 46.0 42.3  MCV 91.5 88.5  PLT 275 [id number]   Basic Metabolic Panel: Recent Labs  Lab 06/29/19 0723 06/30/19 0547  NA 139 139  K 4.6 4.2  CL 93* 96*  CO2 34* 33*  GLUCOSE 82 113*  BUN  19 19  CREATININE 0.78 0.55  CALCIUM 9.3 8.5*  MG 1.8  --   PHOS 4.1  --    GFR: Estimated Creatinine Clearance: 46 mL/min (by C-G formula based on SCr of 0.55 mg/dL). Liver Function Tests: Recent Labs  Lab 06/29/19 0723  AST 32  ALT 21  ALKPHOS 95  BILITOT 0.6  PROT 6.6  ALBUMIN 3.8   No results for input(s):  LIPASE, AMYLASE in the last 168 hours. No results for input(s): AMMONIA in the last 168 hours. Coagulation Profile: No results for input(s): INR, PROTIME in the last 168 hours. Cardiac Enzymes: No results for input(s): CKTOTAL, CKMB, CKMBINDEX, TROPONINI in the last 168 hours. BNP (last 3 results) No results for input(s): PROBNP in the last 8760 hours. HbA1C: No results for input(s): HGBA1C in the last 72 hours. CBG: No results for input(s): GLUCAP in the last 168 hours. Lipid Profile: No results for input(s): CHOL, HDL, LDLCALC, TRIG, CHOLHDL, LDLDIRECT in the last 72 hours. Thyroid Function Tests: No results for input(s): TSH, T4TOTAL, FREET4, T3FREE, THYROIDAB in the last 72 hours. Anemia Panel: No results for input(s): VITAMINB12, FOLATE, FERRITIN, TIBC, IRON, RETICCTPCT in the last 72 hours. Sepsis Labs: No results for input(s): PROCALCITON, LATICACIDVEN in the last 168 hours.  Recent Results (from the past 240 hour(s))  SARS CORONAVIRUS 2 (TAT 6-24 HRS) Nasopharyngeal Nasopharyngeal Swab     Status: None   Collection Time: 06/29/19  8:32 AM   Specimen: Nasopharyngeal Swab  Result Value Ref Range Status   SARS Coronavirus 2 NEGATIVE NEGATIVE Final    Comment: (NOTE) SARS-CoV-2 target nucleic acids are NOT DETECTED. The SARS-CoV-2 RNA is generally detectable in upper and lower respiratory specimens during the acute phase of infection. Negative results do not preclude SARS-CoV-2 infection, do not rule out co-infections with other pathogens, and should not be used as the sole basis for treatment or other patient management decisions. Negative results must be combined with clinical observations, patient history, and epidemiological information. The expected result is Negative. Fact Sheet for Patients: SugarRoll.be Fact Sheet for Healthcare Providers: https://www.woods-mathews.com/ This test is not yet approved or cleared by the [place] FDA and  has been authorized for detection and/or diagnosis of SARS-CoV-2 by FDA under an Emergency Use Authorization (EUA). This EUA will remain  in effect (meaning this test can be used) for the duration of the COVID-19 declaration under Section 56 4(b)(1) of the Act, 21 U.S.C. section 360bbb-3(b)(1), unless the authorization is terminated or revoked sooner. Performed at [facility], [place] [address], [place], [place] [address]          Radiology Studies: DG Chest Port 1 View  Result Date: 06/29/2019 CLINICAL DATA:  Shortness of breath EXAM: PORTABLE CHEST 1 VIEW COMPARISON:  04/02/2019 FINDINGS: Hyperinflation with emphysematous appearance. There is asymmetric lucency on the left where there is lung sutures and costophrenic sulcus scarring. There is no edema, consolidation, effusion, or pneumothorax. Normal heart size and mediastinal contours. IMPRESSION: 1. No acute finding. 2. COPD. Electronically Signed   By: Monte Fantasia M.D.   On: 06/29/2019 07:39        Scheduled Meds: . amitriptyline  10 mg Oral QHS  . amLODipine  2.5 mg Oral Daily  . enoxaparin (LOVENOX) injection  20 mg Subcutaneous Q24H  . feeding supplement (ENSURE ENLIVE)  237 mL Oral QID  . ipratropium-albuterol  3 mL Nebulization TID  . lipase/protease/amylase  24,000 Units Oral QID  . mirtazapine  15  mg Oral QHS  . multivitamin with minerals  1 tablet Oral Daily  . pantoprazole  40 mg Oral Daily  . predniSONE  40 mg Oral Q breakfast  . pregabalin  150 mg Oral BID  . tiZANidine  2 mg Oral QHS   Continuous Infusions:   LOS: 0 days    Time spent: 25 mins.More than 50% of that time was spent in counseling and/or coordination of care.      Shelly Coss, MD [facility] Pager [phone]  If 7PM-7AM, please contact night-coverage [email or web address] Password [facility] 06/30/2019, 4:24 PM

## 2019-06-30 NOTE — Evaluation (Signed)
Physical Therapy Evaluation Patient Details Name: Claudia Wiley MRN: [id number] DOB: 14-Jan-1956 Today's Date: 06/30/2019   History of Present Illness  63 yo female admitted with COPD exac. hx of COPD, lung ca s/p lobectomy, stomach ca, OA, osteoporosis  Clinical Impression  On eval, pt required Min assist for mobility. She walked ~20 feet with 1 HHA and use of hallway handrail. Pt fatigues easily. Dyspnea 3/4 and audible wheezing with activity. Pt remained on 2L [place] during session-sats 94%. Discussed d/c plan-pt plans to return home where she lives with her daughter who assists as needed. Will follow and progress activity as pt is able to tolerate.      Home health PT;Supervision - Intermittent    Equipment Recommendations  None recommended by PT    Recommendations for Other Services       Precautions / Restrictions Precautions Precautions: Fall Restrictions Weight Bearing Restrictions: No      Mobility  Bed Mobility Overal bed mobility: Modified Independent                Transfers Overall transfer level: Needs assistance Equipment used: 1 person hand held assist Transfers: Sit to/from Stand Sit to Stand: Min assist         General transfer comment: Assist to rise, steady. Increased time.  Ambulation/Gait Ambulation/Gait assistance: Min assist Gait Distance (Feet): 20 Feet Assistive device: 1 person hand held assist(and hallway handrail) Gait Pattern/deviations: Decreased step length - right;Decreased step length - left;Decreased stride length     General Gait Details: Very slow, cautious gait pattern. Unsteady. O2 94% on 2L [place]. Dyspnea 3/4. Audible Wheezing.  Stairs            Wheelchair Mobility    Modified Rankin (Stroke Patients Only)       Balance Overall balance assessment: Needs assistance         Standing balance support: Single extremity supported;Bilateral upper extremity supported Standing balance-Leahy Scale: Poor                                Pertinent Vitals/Pain Pain Assessment: No/denies pain    Home Living Family/patient expects to be discharged to:: Private residence Living Arrangements: Children Available Help at Discharge: Family Type of Home: Apartment Home Access: Stairs to enter   [employer] of Steps: 3; right rail going up Home Layout: One level Home Equipment: Environmental consultant - 2 wheels;Shower seat;Wheelchair - manual;Cane - single point Additional Comments: Pt likes to bathe in tub, requires assistance to get in/out of tub. Pt reports she has had multiple falls in bathroom    Prior Function Level of Independence: Needs assistance   Gait / Transfers Assistance Needed: uses rollator PRN  ADL's / Homemaking Assistance Needed: family assists with household tasks  Comments: daughter and granddaughter work in the evening     Hand Dominance        Extremity/Trunk Assessment   Upper Extremity Assessment Upper Extremity Assessment: Defer to OT evaluation    Lower Extremity Assessment Lower Extremity Assessment: Generalized weakness    Cervical / Trunk Assessment Cervical / Trunk Assessment: Normal  Communication   Communication: No difficulties  Cognition Arousal/Alertness: Awake/alert Behavior During Therapy: WFL for tasks assessed/performed Overall Cognitive Status: Within Functional Limits for tasks assessed  General Comments      Exercises     Assessment/Plan    PT Assessment Patient needs continued PT services  PT Problem List Decreased strength;Decreased mobility;Decreased activity tolerance;Decreased balance;Cardiopulmonary status limiting activity       PT Treatment Interventions DME instruction;Gait training;Therapeutic exercise;Therapeutic activities;Patient/family education;Balance training;Functional mobility training    PT Goals (Current goals can be found in the Care Plan section)   Acute Rehab PT Goals Patient Stated Goal: to get better/breathe better PT Goal Formulation: With patient Time For Goal Achievement: 07/14/19 Potential to Achieve Goals: Fair    Frequency Min 3X/week   Barriers to discharge        Co-evaluation               AM-PAC PT "6 Clicks" Mobility  Outcome Measure Help needed turning from your back to your side while in a flat bed without using bedrails?: None Help needed moving from lying on your back to sitting on the side of a flat bed without using bedrails?: None Help needed moving to and from a bed to a chair (including a wheelchair)?: A Little Help needed standing up from a chair using your arms (e.g., wheelchair or bedside chair)?: A Little Help needed to walk in hospital room?: A Little Help needed climbing 3-5 steps with a railing? : A Little 6 Click Score: 20    End of Session Equipment Utilized During Treatment: Oxygen Activity Tolerance: Patient limited by fatigue(limited by dyspnea) Patient left: in bed;with call bell/phone within reach   PT Visit Diagnosis: Unsteadiness on feet (R26.81);Difficulty in walking, not elsewhere classified (R26.2);Muscle weakness (generalized) (M62.81);History of falling (Z91.81)    Time: [id number] PT Time Calculation (min) (ACUTE ONLY): 13 min   Charges:   PT Evaluation $PT Eval Moderate Complexity: 1 Mod           Doreatha Massed, PT Acute Rehabilitation Services

## 2019-06-30 NOTE — Progress Notes (Signed)
Initial Nutrition Assessment  DOCUMENTATION CODES:   Underweight  INTERVENTION:   -Ensure Enlive po QID, each supplement provides 350 kcal and 20 grams of protein -Multivitamin with minerals daily  NUTRITION DIAGNOSIS:   Increased nutrient needs related to chronic illness(COPD) as evidenced by estimated needs.  GOAL:   Patient will meet greater than or equal to 90% of their needs  MONITOR:   PO intake, Supplement acceptance, Labs, Weight trends, I & O's  REASON FOR ASSESSMENT:   Consult COPD Protocol  ASSESSMENT:   63 y.o. female with medical history significant of anemia, osteoarthritis, history of upper left long and stomach cancer, esophageal stricture, gastrinoma, GERD, pancreatic insufficiency, osteoporosis, vitamin D deficiency, malnutrition with unintentional weight loss, COPD, history of pneumonia who was discharged home on oxygen on her last admission on 04/07/2019, but states that on her last visit to the [place] earlier this month, her oxygen was discontinued.  Since then, she has had progressively worse fatigue and has been very dyspneic over the past few days despite using her inhalers at home.  **RD working remotely**  Patient admitted with COPD exacerbation. Pt drinks Ensure supplements at home given history of malnutrition. No PO documented yet this admission. Ensure supplements have been ordered for QID, pt drinking at this time.   Per weight records, weight tends to stay between 87-90 lbs. This has been her UBW for the past year.   Labs reviewed. Medications: Creon capsule QID, Remeron tablet  NUTRITION - FOCUSED PHYSICAL EXAM:  Working remotely.  Diet Order:   Diet Order            Diet Heart Room service appropriate? Yes; Fluid consistency: Thin  Diet effective now              EDUCATION NEEDS:   No education needs have been identified at this time  Skin:  Skin Assessment: Reviewed RN Assessment  Last BM:  12/14  Height:   Ht Readings from  Last 1 Encounters:  06/29/19 4\' 11"  (1.499 m)    Weight:   Wt Readings from Last 1 Encounters:  06/29/19 39.5 kg    Ideal Body Weight:  44.7 kg  BMI:  Body mass index is 17.57 kg/m.  Estimated Nutritional Needs:   Kcal:  1300-1500  Protein:  60-70g  Fluid:  1.5L/day  Clayton Bibles, MS, RD, LDN Inpatient Clinical Dietitian Pager: [phone] After Hours Pager: [phone]

## 2019-06-30 NOTE — Evaluation (Signed)
Occupational Therapy Evaluation Patient Details Name: Claudia Wiley MRN: [id number] DOB: 1955/10/31 Today's Date: 06/30/2019    History of Present Illness 63 yo female admitted with COPD exac. hx of COPD, lung ca s/p lobectomy, stomach ca, OA, osteoporosis   Clinical Impression   Pt admitted with COPD exacerbation. Pt currently with functional limitations due to the deficits listed below (see OT Problem List).  Pt will benefit from skilled OT to increase their safety and independence with ADL and functional mobility for ADL to facilitate discharge to venue listed below.      Follow Up Recommendations  Home health OT;Supervision/Assistance - 24 hour    Equipment Recommendations  None recommended by OT    Recommendations for Other Services       Precautions / Restrictions Precautions Precautions: Fall Restrictions Weight Bearing Restrictions: No      Mobility Bed Mobility Overal bed mobility: Modified Independent                Transfers Overall transfer level: Needs assistance Equipment used: 1 person hand held assist Transfers: Sit to/from Stand;Stand Pivot Transfers Sit to Stand: Min assist         General transfer comment: Assist to rise, steady. Increased time.    Balance Overall balance assessment: Needs assistance         Standing balance support: Single extremity supported;Bilateral upper extremity supported Standing balance-Leahy Scale: Poor                             ADL either performed or assessed with clinical judgement   ADL Overall ADL's : Needs assistance/impaired                                       General ADL Comments: pt overall min A with ADL activity.  Pt did need to go to the bathroom and upon standing pt with audible wheezing. RN called and breathing treatment given     Vision Baseline Vision/History: No visual deficits Patient Visual Report: No change from baseline              Pertinent  Vitals/Pain Pain Assessment: No/denies pain     Hand Dominance     Extremity/Trunk Assessment Upper Extremity Assessment Upper Extremity Assessment: Generalized weakness   Lower Extremity Assessment Lower Extremity Assessment: Generalized weakness   Cervical / Trunk Assessment Cervical / Trunk Assessment: Normal   Communication Communication Communication: No difficulties   Cognition Arousal/Alertness: Awake/alert Behavior During Therapy: WFL for tasks assessed/performed Overall Cognitive Status: Within Functional Limits for tasks assessed                                                Home Living Family/patient expects to be discharged to:: Private residence Living Arrangements: Children Available Help at Discharge: Family Type of Home: Apartment Home Access: Stairs to enter [employer] of Steps: 3; right rail going up   Home Layout: One level               Home Equipment: Environmental consultant - 2 wheels;Shower seat;Wheelchair - manual;Cane - single point   Additional Comments: Pt likes to bathe in tub, requires assistance to get in/out of tub. Pt reports she has had multiple falls  in bathroom      Prior Functioning/Environment Level of Independence: Needs assistance  Gait / Transfers Assistance Needed: uses rollator PRN ADL's / Homemaking Assistance Needed: family assists with household tasks   Comments: daughter and granddaughter work in the evening        OT Problem List: Decreased strength;Cardiopulmonary status limiting activity;Decreased activity tolerance      OT Treatment/Interventions: Self-care/ADL training;Patient/family education;DME and/or AE instruction    OT Goals(Current goals can be found in the care plan section) Acute Rehab OT Goals Patient Stated Goal: to get better/breathe better OT Goal Formulation: With patient Time For Goal Achievement: 06/30/19  OT Frequency: Min 2X/week   Barriers to D/C:                AM-PAC OT "6 Clicks" Daily Activity     Outcome Measure Help from another person eating meals?: None Help from another person taking care of personal grooming?: A Little Help from another person toileting, which includes using toliet, bedpan, or urinal?: A Little Help from another person bathing (including washing, rinsing, drying)?: A Little Help from another person to put on and taking off regular upper body clothing?: A Little Help from another person to put on and taking off regular lower body clothing?: A Little 6 Click Score: 19   End of Session Nurse Communication: Mobility status  Activity Tolerance: Other (comment)(limited by wheezing) Patient left:    OT Visit Diagnosis: Unsteadiness on feet (R26.81);Muscle weakness (generalized) (M62.81)                Time: [id number] OT Time Calculation (min): 17 min Charges:  OT General Charges $OT Visit: 1 Visit OT Evaluation $OT Eval Moderate Complexity: 1 Mod  Kari Baars, [place] [phone] Office- [phone], Edwena Felty D 06/30/2019, 3:42 PM

## 2019-07-01 MED ORDER — PREDNISONE 20 MG PO TABS: 40.0000 mg | ORAL_TABLET | Freq: Every day | ORAL | 0 refills | Status: DC

## 2019-07-01 NOTE — TOC Initial Note (Signed)
Transition of Care [facility]) - Initial/Assessment Note    Patient Details  Name: Claudia Wiley MRN: [id number] Date of Birth: [birth year]  Transition of Care [facility]) CM/SW Contact:    Trish Mage, LCSW Phone Number: 07/01/2019, 9:51 AM  Clinical Narrative:  Met with this delightful 63 YO AA female, here for acute on chronic respiratory failure, in follow up to PT, OT recommendation of [place] services.  She lives with her daughter and granddaughter here in [place], has DME waker, which she rarely uses, and O2 supplied by [place].  She also gets [facility] PT through [facility].   Daughter reports patient went to cardiologist a couple weeks ago, and was taken off of O2. Since then, she has gotten increasingly SOB.  TOC will get orders for O2 and HH services to proper channels in [place] so that patient can resume all services. TOC will continue to follow during the course of hospitalization.              Expected Discharge Plan: [place] Barriers to Discharge: No Barriers Identified   Patient Goals and CMS Choice Patient states their goals for this hospitalization and ongoing recovery are:: "I already have all the equipment and services I need."      Expected Discharge Plan and Services Expected Discharge Plan: [place]   Discharge Planning Services: CM Consult Post Acute Care Choice: [place] arrangements for the past 2 months: Apartment                           HH Arranged: PT, OT HH Agency: [facility] Date [place]: 07/01/19 Time [place]: [phone] Representative spoke with at [place]: Left message  Prior Living Arrangements/Services Living arrangements for the past 2 months: Apartment Lives with:: Adult Children Patient language and need for interpreter reviewed:: Yes Do you feel safe going back to the place where you live?: Yes      Need for Family Participation in Patient Care: Yes (Comment) Care giver support  system in place?: Yes (comment) Current home services: DME, Home PT Criminal Activity/Legal Involvement Pertinent to Current Situation/Hospitalization: No - Comment as needed  Activities of Daily Living Home Assistive Devices/Equipment: Dentures (specify type), Eyeglasses, Walker (specify type), Shower chair with back, Oxygen(rolling and standard) ADL Screening (condition at time of admission) Patient's cognitive ability adequate to safely complete daily activities?: Yes Is the patient deaf or have difficulty hearing?: No Does the patient have difficulty seeing, even when wearing glasses/contacts?: No Does the patient have difficulty concentrating, remembering, or making decisions?: No Patient able to express need for assistance with ADLs?: Yes Does the patient have difficulty dressing or bathing?: Yes Independently performs ADLs?: No Communication: Independent Dressing (OT): Independent Grooming: Independent Feeding: Independent Bathing: Needs assistance Is this a change from baseline?: Pre-admission baseline Toileting: Independent with device (comment) In/Out Bed: Independent with device (comment) Walks in Home: Independent with device (comment) Does the patient have difficulty walking or climbing stairs?: Yes Weakness of Legs: Both Weakness of Arms/Hands: None  Permission Sought/Granted                  Emotional Assessment Appearance:: Appears stated age Attitude/Demeanor/Rapport: Engaged Affect (typically observed): Appropriate Orientation: : Oriented to Self, Oriented to Place, Oriented to  Time, Oriented to Situation Alcohol / Substance Use: Not Applicable Psych Involvement: No (comment)  Admission diagnosis:  COPD exacerbation ([place]) [J44.1] Acute on  chronic respiratory failure with hypoxia (HCC) [J96.21] Acute on chronic respiratory failure with hypoxia and hypercapnia (HCC) [J96.21, J96.22] COPD (chronic obstructive pulmonary disease) ([place]) [J44.9] Patient  Active Problem List   Diagnosis Date Noted  . COPD (chronic obstructive pulmonary disease) ([place]) 06/30/2019  . Acute on chronic respiratory failure with hypoxia and hypercapnia ([place]) 06/29/2019  . GERD (gastroesophageal reflux disease)   . Pancreatic insufficiency   . Nausea & vomiting 04/03/2019  . Abdominal pain 04/03/2019  . Abnormal finding on urinalysis 04/03/2019  . Acute respiratory failure with hypoxia and hypercapnia ([place]) 04/02/2019  . COPD exacerbation ([place]) 06/15/2018  . Essential hypertension 06/15/2018  . Genetic testing 06/01/2018  . Gastrinoma   . Family history of melanoma   . Family history of brain cancer   . Hypokalemia 10/19/2011  . Severe protein-calorie malnutrition ([place]) 10/19/2011  . Weight loss 10/19/2011  . Generalized weakness 10/19/2011  . Dysphagia 10/19/2011  . Anodontia 10/19/2011   PCP:  System, Pcp Not In Pharmacy:   [facility], [place] [address] [place] [address] Phone: [phone] Fax: [phone]  [place], [place] - [address] [phone] [place] [address] Phone: [phone] Fax: [phone]     Social Determinants of Health (SDOH) Interventions    Readmission Risk Interventions No flowsheet data found.

## 2019-07-01 NOTE — Progress Notes (Signed)
SATURATION QUALIFICATIONS: (This note is used to comply with regulatory documentation for home oxygen)  Patient Saturations on Room Air at Rest = 89%  Patient Saturations on Room Air while Ambulating = 87%  Patient Saturations on 2 Liters of oxygen while Ambulating = 94%  Please briefly explain why patient needs home oxygen: Pt needs 2L O2 while ambulating to maintain oxygen saturations above 88%.

## 2019-07-01 NOTE — Discharge Summary (Signed)
Physician Discharge Summary  Claudia Wiley [id number] DOB: 03/13/1956 DOA: 06/29/2019  PCP: System, Pcp Not In  Admit date: 06/29/2019 Discharge date: 07/01/2019  Admitted From: Home Disposition:  Home  Discharge Condition:Stable CODE STATUS:FULL Diet recommendation: Heart Healthy   Brief/Interim Summary:  Patient is a 63 year old female with history of anemia, osteoarthritis, lung/stomach cancer, esophageal stricture, gastrinoma, GERD, pancreatic insufficiency, osteoporosis, vitamin deficiency, COPD who presents to the emergency department with complaints of progressive fatigue, dyspnea, wheezing.  When she arrived she was saturating 66% on room air.  Chest x-ray did not show pneumonia.  Covid -19 test  was negative.  She was admitted for COPD exacerbation.  Started on steroids, bronchodilators.  Her respiratory status is more stable now.  She qualified for home oxygen.  She will be discharged on oral steroids.  Following problems were addressed during her hospitalization:   COPD exacerbation: Started on bronchodilators, nebulization.  She is currently not on oxygen at home.  She follows at the [place].  She feels better today.  She will need oxygen on discharge.  Continue oral steroids, oxygen supplementation, bronchodilators. [facility]  Hypertension: Currently blood pressure stable.  Continue current regimen.  GERD: Continue PPI  Pancreatic insufficiency: Continue Creon  Severe protein calorie malnutrition: Has history of gastrinoma.  Continue nutrition supplements.  Debility/deconditioning: Patient seen by PT/OT.  Recommended home health on discharge.  Discharge Diagnoses:  Principal Problem:   Acute on chronic respiratory failure with hypoxia and hypercapnia (HCC) Active Problems:   Severe protein-calorie malnutrition (HCC)   COPD exacerbation (HCC)   Essential hypertension   GERD (gastroesophageal reflux disease)   Pancreatic insufficiency    COPD (chronic obstructive pulmonary disease) (HCC)    Discharge Instructions  Discharge Instructions    Diet - low sodium heart healthy   Complete by: As directed    Discharge instructions   Complete by: As directed    1)Follow up with your PCP in a week. 2)Take prescribed medications as instructed.   Increase activity slowly   Complete by: As directed      Allergies as of 07/01/2019      Reactions   Azithromycin    Gabapentin       Medication List    TAKE these medications   acetaminophen 500 MG tablet Commonly known as: TYLENOL Take 1,000 mg by mouth every 6 (six) hours as needed for moderate pain or headache.   albuterol 108 (90 Base) MCG/ACT inhaler Commonly known as: VENTOLIN HFA Inhale 2 puffs into the lungs every 6 (six) hours as needed for wheezing or shortness of breath.   albuterol 108 (90 Base) MCG/ACT inhaler Commonly known as: VENTOLIN HFA Inhale into the lungs every 6 (six) hours as needed for wheezing or shortness of breath.   amitriptyline 10 MG tablet Commonly known as: ELAVIL Take 10 mg by mouth at bedtime.   amLODipine 2.5 MG tablet Commonly known as: NORVASC Take 2.5 mg by mouth daily.   budesonide-formoterol 160-4.5 MCG/ACT inhaler Commonly known as: SYMBICORT Inhale 1 puff into the lungs daily.   cholecalciferol 10 MCG (400 UNIT) Tabs tablet Commonly known as: VITAMIN D3 Take 800 Units by mouth.   Ensure Take 237 mLs by mouth 4 (four) times daily. Between Meals.   guaiFENesin 600 MG 12 hr tablet Commonly known as: MUCINEX Take 1 tablet (600 mg total) by mouth 2 (two) times daily.   lipase/protease/amylase 12000 units Cpep capsule Commonly known as: CREON Take 24,000 Units by mouth  4 (four) times daily.   mirtazapine 15 MG tablet Commonly known as: REMERON Take 15 mg by mouth at bedtime.   multivitamin tablet Take 1 tablet by mouth daily.   pantoprazole 40 MG tablet Commonly known as: PROTONIX Take 40 mg by mouth daily.    polyethylene glycol 17 g packet Commonly known as: MIRALAX / GLYCOLAX Take 17 g by mouth daily as needed for moderate constipation.   predniSONE 20 MG tablet Commonly known as: DELTASONE Take 2 tablets (40 mg total) by mouth daily. Start taking on: July 02, 2019   pregabalin 150 MG capsule Commonly known as: LYRICA Take 150 mg by mouth 2 (two) times daily.   tiotropium 18 MCG inhalation capsule Commonly known as: SPIRIVA Place 18 mcg into inhaler and inhale daily.   tiZANidine 4 MG capsule Commonly known as: ZANAFLEX Take 2 mg by mouth at bedtime.            Durable Medical Equipment  (From admission, onward)         Start     Ordered   07/01/19 1110  For home use only DME oxygen  Once    Question Answer Comment  Length of Need Lifetime   Mode or (Route) Nasal cannula   Liters per Minute 2   Frequency Continuous (stationary and portable oxygen unit needed)   Oxygen delivery system Gas      07/01/19 1110          Allergies  Allergen Reactions  . Azithromycin   . Gabapentin     Consultations: None  Procedures/Studies: DG Chest Port 1 View  Result Date: 06/29/2019 CLINICAL DATA:  Shortness of breath EXAM: PORTABLE CHEST 1 VIEW COMPARISON:  04/02/2019 FINDINGS: Hyperinflation with emphysematous appearance. There is asymmetric lucency on the left where there is lung sutures and costophrenic sulcus scarring. There is no edema, consolidation, effusion, or pneumothorax. Normal heart size and mediastinal contours. IMPRESSION: 1. No acute finding. 2. COPD. Electronically Signed   By: Monte Fantasia M.D.   On: 06/29/2019 07:39       Subjective: Patient seen and examined at the bedside this morning.  Hemodynamically stable for discharge today.  Discharge Exam: Vitals:   07/01/19 0611 07/01/19 0831  BP: (!) 116/92   Pulse: (!) 108   Resp: 17   Temp: 98.6 F (37 C)   SpO2: 95% 98%   Vitals:   06/30/19 1920 06/30/19 2031 07/01/19 0611 07/01/19  0831  BP:  118/77 (!) 116/92   Pulse:  (!) 110 (!) 108   Resp:  17 17   Temp:  98.4 F (36.9 C) 98.6 F (37 C)   TempSrc:  Oral Oral   SpO2: 97% 93% 95% 98%  Weight:      Height:        General: Pt is alert, awake, not in acute distress Cardiovascular: RRR, S1/S2 +, no rubs, no gallops Respiratory: Diminished air entry bilaterally, no wheezing, no rhonchi Abdominal: Soft, NT, ND, bowel sounds + Extremities: no edema, no cyanosis    The results of significant diagnostics from this hospitalization (including imaging, microbiology, ancillary and laboratory) are listed below for reference.     Microbiology: Recent Results (from the past 240 hour(s))  SARS CORONAVIRUS 2 (TAT 6-24 HRS) Nasopharyngeal Nasopharyngeal Swab     Status: None   Collection Time: 06/29/19  8:32 AM   Specimen: Nasopharyngeal Swab  Result Value Ref Range Status   SARS Coronavirus 2 NEGATIVE NEGATIVE Final  Comment: (NOTE) SARS-CoV-2 target nucleic acids are NOT DETECTED. The SARS-CoV-2 RNA is generally detectable in upper and lower respiratory specimens during the acute phase of infection. Negative results do not preclude SARS-CoV-2 infection, do not rule out co-infections with other pathogens, and should not be used as the sole basis for treatment or other patient management decisions. Negative results must be combined with clinical observations, patient history, and epidemiological information. The expected result is Negative. Fact Sheet for Patients: SugarRoll.be Fact Sheet for Healthcare Providers: https://www.woods-mathews.com/ This test is not yet approved or cleared by the [place] FDA and  has been authorized for detection and/or diagnosis of SARS-CoV-2 by FDA under an Emergency Use Authorization (EUA). This EUA will remain  in effect (meaning this test can be used) for the duration of the COVID-19 declaration under Section 56 4(b)(1) of the Act,  21 U.S.C. section 360bbb-3(b)(1), unless the authorization is terminated or revoked sooner. Performed at [facility], [place] [address] [place], [place] [address]      Labs: BNP (last 3 results) No results for input(s): BNP in the last 8760 hours. Basic Metabolic Panel: Recent Labs  Lab 06/29/19 0723 06/30/19 0547  NA 139 139  K 4.6 4.2  CL 93* 96*  CO2 34* 33*  GLUCOSE 82 113*  BUN 19 19  CREATININE 0.78 0.55  CALCIUM 9.3 8.5*  MG 1.8  --   PHOS 4.1  --    Liver Function Tests: Recent Labs  Lab 06/29/19 0723  AST 32  ALT 21  ALKPHOS 95  BILITOT 0.6  PROT 6.6  ALBUMIN 3.8   No results for input(s): LIPASE, AMYLASE in the last 168 hours. No results for input(s): AMMONIA in the last 168 hours. CBC: Recent Labs  Lab 06/29/19 0723 06/30/19 0547  WBC 9.4 8.4  NEUTROABS 6.3  --   HGB 14.0 13.3  HCT 46.0 42.3  MCV 91.5 88.5  PLT 275 276   Cardiac Enzymes: No results for input(s): CKTOTAL, CKMB, CKMBINDEX, TROPONINI in the last 168 hours. BNP: Invalid input(s): POCBNP CBG: No results for input(s): GLUCAP in the last 168 hours. D-Dimer No results for input(s): DDIMER in the last 72 hours. Hgb A1c No results for input(s): HGBA1C in the last 72 hours. Lipid Profile No results for input(s): CHOL, HDL, LDLCALC, TRIG, CHOLHDL, LDLDIRECT in the last 72 hours. Thyroid function studies No results for input(s): TSH, T4TOTAL, T3FREE, THYROIDAB in the last 72 hours.  Invalid input(s): FREET3 Anemia work up No results for input(s): VITAMINB12, FOLATE, FERRITIN, TIBC, IRON, RETICCTPCT in the last 72 hours. Urinalysis    Component Value Date/Time   COLORURINE YELLOW 04/02/2019 1957   APPEARANCEUR CLEAR 04/02/2019 1957   LABSPEC 1.006 04/02/2019 1957   PHURINE 6.0 04/02/2019 [place] NEGATIVE 04/02/2019 1957   HGBUR NEGATIVE 04/02/2019 [place] NEGATIVE 04/02/2019 1957   KETONESUR 20 (A) 04/02/2019 1957   PROTEINUR NEGATIVE 04/02/2019  1957   UROBILINOGEN 0.2 12/27/2012 0454   NITRITE NEGATIVE 04/02/2019 1957   LEUKOCYTESUR TRACE (A) 04/02/2019 1957   Sepsis Labs Invalid input(s): PROCALCITONIN,  WBC,  LACTICIDVEN Microbiology Recent Results (from the past 240 hour(s))  SARS CORONAVIRUS 2 (TAT 6-24 HRS) Nasopharyngeal Nasopharyngeal Swab     Status: None   Collection Time: 06/29/19  8:32 AM   Specimen: Nasopharyngeal Swab  Result Value Ref Range Status   SARS Coronavirus 2 NEGATIVE NEGATIVE Final    Comment: (NOTE) SARS-CoV-2 target nucleic acids are NOT DETECTED.  The SARS-CoV-2 RNA is generally detectable in upper and lower respiratory specimens during the acute phase of infection. Negative results do not preclude SARS-CoV-2 infection, do not rule out co-infections with other pathogens, and should not be used as the sole basis for treatment or other patient management decisions. Negative results must be combined with clinical observations, patient history, and epidemiological information. The expected result is Negative. Fact Sheet for Patients: SugarRoll.be Fact Sheet for Healthcare Providers: https://www.woods-mathews.com/ This test is not yet approved or cleared by the [place] FDA and  has been authorized for detection and/or diagnosis of SARS-CoV-2 by FDA under an Emergency Use Authorization (EUA). This EUA will remain  in effect (meaning this test can be used) for the duration of the COVID-19 declaration under Section 56 4(b)(1) of the Act, 21 U.S.C. section 360bbb-3(b)(1), unless the authorization is terminated or revoked sooner. Performed at [facility], [place] [address], [place], [place] [address]     Please note: You were cared for by a hospitalist during your hospital stay. Once you are discharged, your primary care physician will handle any further medical issues. Please note that NO REFILLS for any discharge medications will be authorized once  you are discharged, as it is imperative that you return to your primary care physician (or establish a relationship with a primary care physician if you do not have one) for your post hospital discharge needs so that they can reassess your need for medications and monitor your lab values.    Time coordinating discharge: 40 minutes  SIGNED:   Shelly Coss, MD  [facility] 07/01/2019, 11:28 AM Pager [phone]  If 7PM-7AM, please contact night-coverage [email or web address] Password [email or web address]

## 2019-07-01 NOTE — Progress Notes (Signed)
PT Cancellation Note  Patient Details Name: Claudia Wiley MRN: [id number] DOB: Sep 03, 1955   Cancelled Treatment:    Reason Eval/Treat Not Completed: Patient declined, no reason specified  Pt reports just ambulated with nursing.  Reports she is comfortable with ambulation and stairs; has assist at home; and is discharging later today.  Declined PT at this time.  Maggie Font, PT Acute Rehab Services Pager [phone] [facility] [phone]    Karlton Lemon 07/01/2019, 12:45 PM

## 2019-09-09 ENCOUNTER — Emergency Department (HOSPITAL_COMMUNITY): Payer: No Typology Code available for payment source

## 2019-09-09 ENCOUNTER — Inpatient Hospital Stay (HOSPITAL_COMMUNITY)
Admission: EM | Admit: 2019-09-09 | Discharge: 2019-09-19 | DRG: 368 | Disposition: A | Discharge status: Home Health | Payer: No Typology Code available for payment source | Attending: Internal Medicine | Admitting: Internal Medicine

## 2019-09-09 ENCOUNTER — Encounter (HOSPITAL_COMMUNITY): Payer: Self-pay | Admitting: Emergency Medicine

## 2019-09-09 ENCOUNTER — Other Ambulatory Visit: Payer: Self-pay

## 2019-09-09 ENCOUNTER — Inpatient Hospital Stay (HOSPITAL_COMMUNITY): Payer: No Typology Code available for payment source

## 2019-09-09 DIAGNOSIS — R1319 Other dysphagia: Secondary | ICD-10-CM

## 2019-09-09 DIAGNOSIS — K222 Esophageal obstruction: Secondary | ICD-10-CM

## 2019-09-09 DIAGNOSIS — K2970 Gastritis, unspecified, without bleeding: Secondary | ICD-10-CM | POA: Diagnosis present

## 2019-09-09 DIAGNOSIS — K209 Esophagitis, unspecified without bleeding: Secondary | ICD-10-CM

## 2019-09-09 DIAGNOSIS — R1314 Dysphagia, pharyngoesophageal phase: Secondary | ICD-10-CM | POA: Diagnosis present

## 2019-09-09 DIAGNOSIS — E872 Acidosis: Secondary | ICD-10-CM | POA: Diagnosis present

## 2019-09-09 DIAGNOSIS — J9690 Respiratory failure, unspecified, unspecified whether with hypoxia or hypercapnia: Secondary | ICD-10-CM | POA: Diagnosis present

## 2019-09-09 DIAGNOSIS — K5900 Constipation, unspecified: Secondary | ICD-10-CM | POA: Diagnosis present

## 2019-09-09 DIAGNOSIS — R1310 Dysphagia, unspecified: Secondary | ICD-10-CM

## 2019-09-09 DIAGNOSIS — K228 Other specified diseases of esophagus: Secondary | ICD-10-CM | POA: Diagnosis present

## 2019-09-09 DIAGNOSIS — M1990 Unspecified osteoarthritis, unspecified site: Secondary | ICD-10-CM | POA: Diagnosis present

## 2019-09-09 DIAGNOSIS — J9622 Acute and chronic respiratory failure with hypercapnia: Secondary | ICD-10-CM | POA: Diagnosis present

## 2019-09-09 DIAGNOSIS — Z902 Acquired absence of lung [part of]: Secondary | ICD-10-CM | POA: Diagnosis not present

## 2019-09-09 DIAGNOSIS — Z85118 Personal history of other malignant neoplasm of bronchus and lung: Secondary | ICD-10-CM | POA: Diagnosis not present

## 2019-09-09 DIAGNOSIS — Z808 Family history of malignant neoplasm of other organs or systems: Secondary | ICD-10-CM | POA: Diagnosis not present

## 2019-09-09 DIAGNOSIS — J9621 Acute and chronic respiratory failure with hypoxia: Secondary | ICD-10-CM

## 2019-09-09 DIAGNOSIS — I10 Essential (primary) hypertension: Secondary | ICD-10-CM | POA: Diagnosis present

## 2019-09-09 DIAGNOSIS — F418 Other specified anxiety disorders: Secondary | ICD-10-CM | POA: Diagnosis present

## 2019-09-09 DIAGNOSIS — Z681 Body mass index (BMI) 19 or less, adult: Secondary | ICD-10-CM

## 2019-09-09 DIAGNOSIS — K2100 Gastro-esophageal reflux disease with esophagitis, without bleeding: Secondary | ICD-10-CM | POA: Diagnosis present

## 2019-09-09 DIAGNOSIS — R0600 Dyspnea, unspecified: Secondary | ICD-10-CM | POA: Diagnosis not present

## 2019-09-09 DIAGNOSIS — J449 Chronic obstructive pulmonary disease, unspecified: Secondary | ICD-10-CM | POA: Diagnosis present

## 2019-09-09 DIAGNOSIS — E8770 Fluid overload, unspecified: Secondary | ICD-10-CM | POA: Diagnosis present

## 2019-09-09 DIAGNOSIS — Z20822 Contact with and (suspected) exposure to covid-19: Secondary | ICD-10-CM | POA: Diagnosis present

## 2019-09-09 DIAGNOSIS — R636 Underweight: Secondary | ICD-10-CM | POA: Diagnosis present

## 2019-09-09 DIAGNOSIS — E559 Vitamin D deficiency, unspecified: Secondary | ICD-10-CM | POA: Diagnosis present

## 2019-09-09 DIAGNOSIS — N179 Acute kidney failure, unspecified: Secondary | ICD-10-CM | POA: Diagnosis present

## 2019-09-09 DIAGNOSIS — Z87891 Personal history of nicotine dependence: Secondary | ICD-10-CM

## 2019-09-09 DIAGNOSIS — E876 Hypokalemia: Secondary | ICD-10-CM | POA: Diagnosis present

## 2019-09-09 DIAGNOSIS — Z881 Allergy status to other antibiotic agents status: Secondary | ICD-10-CM

## 2019-09-09 DIAGNOSIS — K269 Duodenal ulcer, unspecified as acute or chronic, without hemorrhage or perforation: Secondary | ICD-10-CM | POA: Diagnosis present

## 2019-09-09 DIAGNOSIS — R112 Nausea with vomiting, unspecified: Secondary | ICD-10-CM | POA: Diagnosis not present

## 2019-09-09 DIAGNOSIS — M810 Age-related osteoporosis without current pathological fracture: Secondary | ICD-10-CM | POA: Diagnosis present

## 2019-09-09 DIAGNOSIS — Z90411 Acquired partial absence of pancreas: Secondary | ICD-10-CM | POA: Diagnosis not present

## 2019-09-09 DIAGNOSIS — Z7951 Long term (current) use of inhaled steroids: Secondary | ICD-10-CM

## 2019-09-09 DIAGNOSIS — Z79899 Other long term (current) drug therapy: Secondary | ICD-10-CM

## 2019-09-09 DIAGNOSIS — D72829 Elevated white blood cell count, unspecified: Secondary | ICD-10-CM | POA: Diagnosis present

## 2019-09-09 DIAGNOSIS — Z90710 Acquired absence of both cervix and uterus: Secondary | ICD-10-CM | POA: Diagnosis not present

## 2019-09-09 DIAGNOSIS — K449 Diaphragmatic hernia without obstruction or gangrene: Secondary | ICD-10-CM | POA: Diagnosis present

## 2019-09-09 DIAGNOSIS — G894 Chronic pain syndrome: Secondary | ICD-10-CM | POA: Diagnosis present

## 2019-09-09 DIAGNOSIS — J441 Chronic obstructive pulmonary disease with (acute) exacerbation: Secondary | ICD-10-CM

## 2019-09-09 DIAGNOSIS — Z8249 Family history of ischemic heart disease and other diseases of the circulatory system: Secondary | ICD-10-CM

## 2019-09-09 DIAGNOSIS — B3781 Candidal esophagitis: Secondary | ICD-10-CM | POA: Diagnosis present

## 2019-09-09 DIAGNOSIS — Z833 Family history of diabetes mellitus: Secondary | ICD-10-CM

## 2019-09-09 DIAGNOSIS — Z888 Allergy status to other drugs, medicaments and biological substances status: Secondary | ICD-10-CM

## 2019-09-09 LAB — BLOOD GAS, VENOUS
Acid-Base Excess: 3.3 mmol/L — ABNORMAL HIGH (ref 0.0–2.0)
Bicarbonate: 37.5 mmol/L — ABNORMAL HIGH (ref 20.0–28.0)
O2 Saturation: 27.9 %
Patient temperature: 37
pCO2, Ven: 104 mmHg (ref 44.0–60.0)
pH, Ven: 7.18 — CL (ref 7.250–7.430)
pO2, Ven: <31 mmHg — CL (ref 32.0–45.0)

## 2019-09-09 LAB — CBC WITH DIFFERENTIAL/PLATELET
Abs Immature Granulocytes: 0.38 10*3/uL — ABNORMAL HIGH (ref 0.00–0.07)
Basophils Absolute: 0.1 10*3/uL (ref 0.0–0.1)
Basophils Relative: 0 %
Eosinophils Absolute: 0 10*3/uL (ref 0.0–0.5)
Eosinophils Relative: 0 %
HCT: 49.7 % — ABNORMAL HIGH (ref 36.0–46.0)
Hemoglobin: 15.6 g/dL — ABNORMAL HIGH (ref 12.0–15.0)
Immature Granulocytes: 1 %
Lymphocytes Relative: 4 %
Lymphs Abs: 1.3 10*3/uL (ref 0.7–4.0)
MCH: 28.8 pg (ref 26.0–34.0)
MCHC: 31.4 g/dL (ref 30.0–36.0)
MCV: 91.7 fL (ref 80.0–100.0)
Monocytes Absolute: 1 10*3/uL (ref 0.1–1.0)
Monocytes Relative: 3 %
Neutro Abs: 29.2 10*3/uL — ABNORMAL HIGH (ref 1.7–7.7)
Neutrophils Relative %: 92 %
Platelets: 247 10*3/uL (ref 150–400)
RBC: 5.42 MIL/uL — ABNORMAL HIGH (ref 3.87–5.11)
RDW: 15.3 % (ref 11.5–15.5)
WBC: 31.9 10*3/uL — ABNORMAL HIGH (ref 4.0–10.5)
nRBC: 0 % (ref 0.0–0.2)

## 2019-09-09 LAB — BASIC METABOLIC PANEL
Anion gap: 16 — ABNORMAL HIGH (ref 5–15)
BUN: 37 mg/dL — ABNORMAL HIGH (ref 8–23)
CO2: 34 mmol/L — ABNORMAL HIGH (ref 22–32)
Calcium: 9.7 mg/dL (ref 8.9–10.3)
Chloride: 92 mmol/L — ABNORMAL LOW (ref 98–111)
Creatinine, Ser: 0.94 mg/dL (ref 0.44–1.00)
GFR calc Af Amer: >60 mL/min (ref >60)
GFR calc non Af Amer: >60 mL/min (ref >60)
Glucose, Bld: 90 mg/dL (ref 70–99)
Potassium: 5.5 mmol/L — ABNORMAL HIGH (ref 3.5–5.1)
Sodium: 142 mmol/L (ref 135–145)

## 2019-09-09 LAB — BLOOD GAS, ARTERIAL
Acid-Base Excess: 4.2 mmol/L — ABNORMAL HIGH (ref 0.0–2.0)
Bicarbonate: 32.8 mmol/L — ABNORMAL HIGH (ref 20.0–28.0)
O2 Saturation: 92 %
Patient temperature: 37
pCO2 arterial: 71.1 mmHg (ref 32.0–48.0)
pH, Arterial: 7.286 — ABNORMAL LOW (ref 7.350–7.450)
pO2, Arterial: 73.4 mmHg — ABNORMAL LOW (ref 83.0–108.0)

## 2019-09-09 LAB — RESPIRATORY PANEL BY RT PCR (FLU A&B, COVID)
Influenza A by PCR: NEGATIVE
Influenza B by PCR: NEGATIVE
SARS Coronavirus 2 by RT PCR: NEGATIVE

## 2019-09-09 LAB — BRAIN NATRIURETIC PEPTIDE: B Natriuretic Peptide: 85.3 pg/mL (ref 0.0–100.0)

## 2019-09-09 LAB — MRSA PCR SCREENING: MRSA by PCR: NEGATIVE

## 2019-09-09 MED ORDER — METHYLPREDNISOLONE SODIUM SUCC 125 MG IJ SOLR
125.0000 mg | Freq: Once | INTRAMUSCULAR | Status: AC
  Administered 2019-09-09: 16:00:00 125 mg via INTRAVENOUS
  Filled 2019-09-09: qty 2

## 2019-09-09 MED ORDER — IPRATROPIUM-ALBUTEROL 0.5-2.5 (3) MG/3ML IN SOLN
3.0000 mL | Freq: Three times a day (TID) | RESPIRATORY_TRACT | Status: DC
  Administered 2019-09-10 – 2019-09-19 (×28): 3 mL via RESPIRATORY_TRACT
  Filled 2019-09-09 (×28): qty 3

## 2019-09-09 MED ORDER — GUAIFENESIN ER 600 MG PO TB12
600.0000 mg | ORAL_TABLET | Freq: Two times a day (BID) | ORAL | Status: DC
  Administered 2019-09-09 – 2019-09-19 (×19): 600 mg via ORAL
  Filled 2019-09-09 (×19): qty 1

## 2019-09-09 MED ORDER — CHLORHEXIDINE GLUCONATE CLOTH 2 % EX PADS
6.0000 | MEDICATED_PAD | Freq: Every day | CUTANEOUS | Status: DC
  Administered 2019-09-09 – 2019-09-16 (×6): 6 via TOPICAL

## 2019-09-09 MED ORDER — SODIUM CHLORIDE 0.9 % IV SOLN: INTRAVENOUS | Status: DC

## 2019-09-09 MED ORDER — MOMETASONE FURO-FORMOTEROL FUM 200-5 MCG/ACT IN AERO: 2.0000 | INHALATION_SPRAY | Freq: Two times a day (BID) | RESPIRATORY_TRACT | Status: DC

## 2019-09-09 MED ORDER — ENOXAPARIN SODIUM 30 MG/0.3ML ~~LOC~~ SOLN
30.0000 mg | Freq: Every day | SUBCUTANEOUS | Status: DC
  Administered 2019-09-09: 22:00:00 30 mg via SUBCUTANEOUS
  Filled 2019-09-09: qty 0.3

## 2019-09-09 MED ORDER — IPRATROPIUM-ALBUTEROL 0.5-2.5 (3) MG/3ML IN SOLN
3.0000 mL | Freq: Once | RESPIRATORY_TRACT | Status: DC
  Filled 2019-09-09: qty 3

## 2019-09-09 MED ORDER — TRAMADOL HCL 50 MG PO TABS
50.0000 mg | ORAL_TABLET | Freq: Once | ORAL | Status: AC
  Administered 2019-09-09: 22:00:00 50 mg via ORAL
  Filled 2019-09-09: qty 1

## 2019-09-09 MED ORDER — AMITRIPTYLINE HCL 10 MG PO TABS
10.0000 mg | ORAL_TABLET | Freq: Every day | ORAL | Status: DC
  Administered 2019-09-09 – 2019-09-18 (×9): 10 mg via ORAL
  Filled 2019-09-09 (×11): qty 1

## 2019-09-09 MED ORDER — ALBUTEROL SULFATE HFA 108 (90 BASE) MCG/ACT IN AERS
4.0000 | INHALATION_SPRAY | Freq: Once | RESPIRATORY_TRACT | Status: AC
  Administered 2019-09-09: 16:00:00 4 via RESPIRATORY_TRACT
  Filled 2019-09-09: qty 6.7

## 2019-09-09 MED ORDER — METHYLPREDNISOLONE SODIUM SUCC 125 MG IJ SOLR
60.0000 mg | Freq: Four times a day (QID) | INTRAMUSCULAR | Status: AC
  Administered 2019-09-09 – 2019-09-10 (×4): 60 mg via INTRAVENOUS
  Filled 2019-09-09 (×4): qty 2

## 2019-09-09 MED ORDER — MAGNESIUM SULFATE 2 GM/50ML IV SOLN
2.0000 g | Freq: Once | INTRAVENOUS | Status: AC
  Administered 2019-09-09: 16:00:00 2 g via INTRAVENOUS
  Filled 2019-09-09: qty 50

## 2019-09-09 MED ORDER — SODIUM CHLORIDE (PF) 0.9 % IJ SOLN
INTRAMUSCULAR | Status: AC
  Filled 2019-09-09: qty 50

## 2019-09-09 MED ORDER — ALBUTEROL (5 MG/ML) CONTINUOUS INHALATION SOLN
10.0000 mg/h | INHALATION_SOLUTION | Freq: Once | RESPIRATORY_TRACT | Status: AC
  Administered 2019-09-09: 18:00:00 10 mg/h via RESPIRATORY_TRACT
  Filled 2019-09-09: qty 20

## 2019-09-09 MED ORDER — ORAL CARE MOUTH RINSE
15.0000 mL | Freq: Two times a day (BID) | OROMUCOSAL | Status: DC
  Administered 2019-09-09 – 2019-09-19 (×12): 15 mL via OROMUCOSAL

## 2019-09-09 MED ORDER — SODIUM CHLORIDE 0.9 % IV SOLN
2.0000 g | Freq: Two times a day (BID) | INTRAVENOUS | Status: AC
  Administered 2019-09-09 – 2019-09-14 (×10): 2 g via INTRAVENOUS
  Filled 2019-09-09 (×10): qty 2

## 2019-09-09 MED ORDER — MIRTAZAPINE 15 MG PO TABS
15.0000 mg | ORAL_TABLET | Freq: Every day | ORAL | Status: DC
  Administered 2019-09-09 – 2019-09-18 (×9): 15 mg via ORAL
  Filled 2019-09-09 (×9): qty 1

## 2019-09-09 MED ORDER — IOHEXOL 300 MG/ML  SOLN
100.0000 mL | Freq: Once | INTRAMUSCULAR | Status: AC | PRN
  Administered 2019-09-09: 19:00:00 80 mL via INTRAVENOUS

## 2019-09-09 MED ORDER — TIOTROPIUM BROMIDE MONOHYDRATE 18 MCG IN CAPS: 18.0000 ug | ORAL_CAPSULE | Freq: Every day | RESPIRATORY_TRACT | Status: DC

## 2019-09-09 MED ORDER — AEROCHAMBER PLUS FLO-VU MEDIUM MISC
1.0000 | Freq: Once | Status: AC
  Administered 2019-09-09: 16:00:00 1
  Filled 2019-09-09: qty 1

## 2019-09-09 MED ORDER — UMECLIDINIUM-VILANTEROL 62.5-25 MCG/INH IN AEPB
1.0000 | INHALATION_SPRAY | Freq: Every day | RESPIRATORY_TRACT | Status: DC
  Administered 2019-09-10: 1 via RESPIRATORY_TRACT
  Filled 2019-09-09: qty 14

## 2019-09-09 MED ORDER — PREDNISONE 20 MG PO TABS
40.0000 mg | ORAL_TABLET | Freq: Every day | ORAL | Status: AC
  Administered 2019-09-12 – 2019-09-14 (×3): 40 mg via ORAL
  Filled 2019-09-09 (×5): qty 2

## 2019-09-09 MED ORDER — IPRATROPIUM-ALBUTEROL 0.5-2.5 (3) MG/3ML IN SOLN
3.0000 mL | Freq: Four times a day (QID) | RESPIRATORY_TRACT | Status: DC
  Administered 2019-09-09: 23:00:00 3 mL via RESPIRATORY_TRACT
  Filled 2019-09-09: qty 3

## 2019-09-09 MED ORDER — PANCRELIPASE (LIP-PROT-AMYL) 12000-38000 UNITS PO CPEP
24000.0000 [IU] | ORAL_CAPSULE | Freq: Three times a day (TID) | ORAL | Status: DC
  Administered 2019-09-09 – 2019-09-19 (×33): 24000 [IU] via ORAL
  Filled 2019-09-09 (×40): qty 2

## 2019-09-09 NOTE — ED Notes (Signed)
Daughter, [name] updated by this RN

## 2019-09-09 NOTE — Progress Notes (Signed)
eLink Physician-Brief Progress Note Patient Name: Claudia Wiley DOB: 02/04/1956 MRN: [id number]   Date of Service  09/09/2019  HPI/Events of Note  Pt with known history of COPD who presented with shortness of breath in the context of recent nausea and vomiting. ABG: 7.28/ 71/ 73/ 32 prior to BIPAP.Marland Kitchen Clinically not distressed. CXR: No acute infiltrates, c/w COPD, Afebrile, wbc increased but in context of steroid Rx,  CTAP: Esophagitis, mid tansverse colon transition point?  eICU Interventions  Hospitalist advised to repeat ABG in 1-2 hours following BIPAP and call [facility] if critical care involvement is indicated by result.        Claudia Wiley 09/09/2019, 8:16 PM

## 2019-09-09 NOTE — ED Notes (Signed)
Attempted to call report. Bed not ready. Nurse will call me when bed is ready.

## 2019-09-09 NOTE — ED Notes (Signed)
Date and time results received: 09/09/19 4:55 PM  (use smartphrase ".now" to insert current time)  Test: VBG Critical Value: ph 7.180, pCO2 104, pO2 <31.0  Name of Provider Notified: [place]  Orders Received? Or Actions Taken?: Orders Received - See Orders for details

## 2019-09-09 NOTE — ED Notes (Addendum)
Date and time results received: 09/09/19 1834  Test: ABG Critical Value: pCO2 71.1  Name of Provider Notified: Jai-Gurmukh  Orders Received? Or Actions Taken?: Orders Received - See Orders for details

## 2019-09-09 NOTE — ED Triage Notes (Signed)
Patient arriving by EMS from c/o COPD exacerbation and abd pain x2 days Pt AOx3. EMS reports wheezing in all fields. Hx of COPD, lung cancer, stomach cancer   P 117 R 20 76% on monitor at home (2L [place]), 96% on duoneb

## 2019-09-09 NOTE — ED Provider Notes (Signed)
[facility] DEPT Provider Note   CSN: [id number] Arrival date & time: 09/09/19  1451     History No chief complaint on file.   Claudia Wiley is a 64 y.o. female.  Pt presents to the ED today with sob.  Pt has a hx of COPD and has been more sob than usual for the past 2 days.  Pt has oxygen at home that she can use as needed.  She has been having to use it more frequently.  No f/c.  No known covid exposures.        Past Medical History:  Diagnosis Date  . Anemia   . Arthritis   . Cancer [facility]) 2013   Upper left lung and stomach cancer  . COPD (chronic obstructive pulmonary disease) ([place])   . Esophageal stricture   . Family history of brain cancer   . Family history of melanoma   . Gastrinoma   . Gastrinoma   . GERD (gastroesophageal reflux disease)   . Hypertension   . Osteoporosis   . Pancreatic insufficiency   . Pneumonia   . Vitamin D deficiency   . Weight loss, unintentional     Patient Active Problem List   Diagnosis Date Noted  . COPD (chronic obstructive pulmonary disease) ([place]) 06/30/2019  . Acute on chronic respiratory failure with hypoxia and hypercapnia ([place]) 06/29/2019  . GERD (gastroesophageal reflux disease)   . Pancreatic insufficiency   . Nausea & vomiting 04/03/2019  . Abdominal pain 04/03/2019  . Abnormal finding on urinalysis 04/03/2019  . Acute respiratory failure with hypoxia and hypercapnia ([place]) 04/02/2019  . COPD exacerbation ([place]) 06/15/2018  . Essential hypertension 06/15/2018  . Genetic testing 06/01/2018  . Gastrinoma   . Family history of melanoma   . Family history of brain cancer   . Hypokalemia 10/19/2011  . Severe protein-calorie malnutrition ([place]) 10/19/2011  . Weight loss 10/19/2011  . Generalized weakness 10/19/2011  . Dysphagia 10/19/2011  . Anodontia 10/19/2011    Past Surgical History:  Procedure Laterality Date  . ABDOMINAL HYSTERECTOMY    . BREAST LUMPECTOMY    . CESAREAN SECTION     . LUNG SURGERY       OB History   No obstetric history on file.     Family History  Problem Relation Age of Onset  . Diabetes Sister        x 3  . Cancer Sister 80       brain that metastized to bone, stomach and other organs  . Melanoma Maternal Aunt   . Heart attack Father   . Pneumonia Brother   . Cancer Other 17       cancer in his chest - nephew  . Colon cancer Neg Hx   . Pancreatic cancer Neg Hx   . Rectal cancer Neg Hx   . Esophageal cancer Neg Hx     Social History   Tobacco Use  . Smoking status: Former Smoker    Packs/day: 0.25    Years: 2.00    Pack years: 0.50    Types: Cigarettes    Quit date: 05/30/2019    Years since quitting: 0.2  . Smokeless tobacco: Never Used  . Tobacco comment: Not ready  Substance Use Topics  . Alcohol use: No  . Drug use: No    Home Medications Prior to Admission medications   Medication Sig Start Date End Date Taking? Authorizing Provider  acetaminophen (TYLENOL) 500 MG tablet Take 1,000 mg  by mouth every 6 (six) hours as needed for moderate pain or headache.   Yes [provider]  albuterol (PROVENTIL HFA;VENTOLIN HFA) 108 (90 Base) MCG/ACT inhaler Inhale into the lungs every 6 (six) hours as needed for wheezing or shortness of breath.   Yes [provider]  amitriptyline (ELAVIL) 10 MG tablet Take 10 mg by mouth at bedtime.   Yes [provider]  amLODipine (NORVASC) 2.5 MG tablet Take 2.5 mg by mouth daily as needed (high blood pressure).    Yes [provider]  budesonide-formoterol (SYMBICORT) 160-4.5 MCG/ACT inhaler Inhale 1 puff into the lungs daily. 06/18/18  Yes Regalado, Belkys A, MD  cholecalciferol (VITAMIN D) 400 units TABS tablet Take 800 Units by mouth.   Yes [provider]  ENSURE (ENSURE) Take 237 mLs by mouth 4 (four) times daily. Between Meals.   Yes [provider]  lipase/protease/amylase (CREON) 12000 units CPEP capsule Take 24,000 Units by mouth 4  (four) times daily.    Yes [provider]  mirtazapine (REMERON) 15 MG tablet Take 15 mg by mouth at bedtime.   Yes [provider]  Multiple Vitamin (MULTIVITAMIN) tablet Take 1 tablet by mouth daily.   Yes [provider]  pantoprazole (PROTONIX) 40 MG tablet Take 40 mg by mouth daily.   Yes [provider]  polyethylene glycol (MIRALAX / GLYCOLAX) 17 g packet Take 17 g by mouth daily as needed for moderate constipation. 04/06/19  Yes Oretha Milch D, MD  pregabalin (LYRICA) 150 MG capsule Take 150 mg by mouth 2 (two) times daily.   Yes [provider]  tiotropium (SPIRIVA) 18 MCG inhalation capsule Place 18 mcg into inhaler and inhale daily.   Yes [provider]  tiZANidine (ZANAFLEX) 4 MG capsule Take 2 mg by mouth at bedtime.    Yes [provider]  albuterol (PROVENTIL HFA;VENTOLIN HFA) 108 (90 Base) MCG/ACT inhaler Inhale 2 puffs into the lungs every 6 (six) hours as needed for wheezing or shortness of breath. Patient not taking: Reported on 04/02/2019 06/18/18   Regalado, Jerald Kief A, MD  guaiFENesin (MUCINEX) 600 MG 12 hr tablet Take 1 tablet (600 mg total) by mouth 2 (two) times daily. Patient not taking: Reported on 04/02/2019 06/18/18   Regalado, Jerald Kief A, MD  predniSONE (DELTASONE) 20 MG tablet Take 2 tablets (40 mg total) by mouth daily. Patient not taking: Reported on 09/09/2019 07/02/19   Shelly Coss, MD    Allergies    Azithromycin and Gabapentin  Review of Systems   Review of Systems  Respiratory: Positive for shortness of breath and wheezing.   All other systems reviewed and are negative.   Physical Exam Updated Vital Signs BP 131/82   Pulse 99   Temp 97.9 F (36.6 C) (Oral)   Resp 12   Ht 4\' 11"  (1.499 m)   Wt 36.3 kg   SpO2 100%   BMI 16.16 kg/m   Physical Exam Vitals and nursing note reviewed.  Constitutional:      General: She is in acute distress.     Appearance: She is underweight.  HENT:       Head: Normocephalic and atraumatic.     Right Ear: External ear normal.     Left Ear: External ear normal.     Nose: Nose normal.     Mouth/Throat:     Mouth: Mucous membranes are moist.     Pharynx: Oropharynx is clear.  Eyes:     Extraocular  Movements: Extraocular movements intact.     Conjunctiva/sclera: Conjunctivae normal.     Pupils: Pupils are equal, round, and reactive to light.  Cardiovascular:     Rate and Rhythm: Regular rhythm. Tachycardia present.     Pulses: Normal pulses.     Heart sounds: Normal heart sounds.  Pulmonary:     Effort: Tachypnea present.     Breath sounds: Wheezing present.  Abdominal:     General: Abdomen is flat. Bowel sounds are normal.     Palpations: Abdomen is soft.  Musculoskeletal:        General: Normal range of motion.     Cervical back: Normal range of motion and neck supple.  Skin:    General: Skin is warm.     Capillary Refill: Capillary refill takes less than 2 seconds.  Neurological:     General: No focal deficit present.     Mental Status: She is alert and oriented to person, place, and time.  Psychiatric:        Mood and Affect: Mood normal.        Behavior: Behavior normal.     ED Results / Procedures / Treatments   Labs (all labs ordered are listed, but only abnormal results are displayed) Labs Reviewed  BASIC METABOLIC PANEL - Abnormal; Notable for the following components:      Result Value   Potassium 5.5 (*)    Chloride 92 (*)    CO2 34 (*)    BUN 37 (*)    Anion gap 16 (*)    All other components within normal limits  BLOOD GAS, VENOUS - Abnormal; Notable for the following components:   pH, Ven 7.180 (*)    pCO2, Ven 104 (*)    pO2, Ven <31.0 (*)    Bicarbonate 37.5 (*)    Acid-Base Excess 3.3 (*)    All other components within normal limits  RESPIRATORY PANEL BY RT PCR (FLU A&B, COVID)  BRAIN NATRIURETIC PEPTIDE  CBC WITH DIFFERENTIAL/PLATELET  POC SARS CORONAVIRUS 2 AG -  ED    EKG EKG  Interpretation  Date/Time:  Thursday September 09 2019 15:32:10 EST Ventricular Rate:  116 PR Interval:    QRS Duration: 68 QT Interval:  305 QTC Calculation: 424 R Axis:   83 Text Interpretation: Age not entered, assumed to be  64 years old for purpose of ECG interpretation Sinus tachycardia Right atrial enlargement Anteroseptal infarct, age indeterminate No significant change since last tracing Confirmed by Isla Pence [id number]) on 09/09/2019 3:45:48 PM   Radiology DG Chest Port 1 View  Result Date: 09/09/2019 CLINICAL DATA:  Shortness of breath. Reported history of lung carcinoma EXAM: PORTABLE CHEST 1 VIEW COMPARISON:  June 29, 2019 FINDINGS: Postoperative change noted in the left perihilar region. Lungs are somewhat hyperexpanded, stable. No edema or airspace opacity. No adenopathy or mass. Heart size normal. No bone lesions. IMPRESSION: Lungs somewhat hyperexpanded, stable. Postoperative change on the left with clips in the left perihilar region. No edema or airspace opacity. No mass or adenopathy. Heart size normal. Electronically Signed   By: Lowella Grip III M.D.   On: 09/09/2019 15:28    Procedures Procedures (including critical care time)  Medications Ordered in ED Medications  ipratropium-albuterol (DUONEB) 0.5-2.5 (3) MG/3ML nebulizer solution 3 mL (0 mLs Nebulization Hold 09/09/19 1605)  albuterol (PROVENTIL,VENTOLIN) solution continuous neb (has no administration in time range)  methylPREDNISolone sodium succinate (SOLU-MEDROL) 125 mg/2 mL injection 125 mg (125 mg Intravenous Given 09/09/19  1553)  magnesium sulfate IVPB 2 g 50 mL (0 g Intravenous Stopped 09/09/19 1733)  albuterol (VENTOLIN HFA) 108 (90 Base) MCG/ACT inhaler 4 puff (4 puffs Inhalation Given 09/09/19 1611)  AeroChamber Plus Flo-Vu Medium MISC 1 each (1 each Other Given 09/09/19 1608)    ED Course  I have reviewed the triage vital signs and the nursing notes.  Pertinent labs & imaging results that  were available during my care of the patient were reviewed by me and considered in my medical decision making (see chart for details).    MDM Rules/Calculators/A&P                      Pt has improved with solumedrol, but pco2 is elevated. Pt will be put on bipap.  Covid negative, so pt will also be given a continuous neb.  Pt's daughter updated via phone per pt request.  Pt d/w Dr. Verlon Au ([facility]) for admission.  CRITICAL CARE Performed by: Isla Pence   Total critical care time: 30 minutes  Critical care time was exclusive of separately billable procedures and treating other patients.  Critical care was necessary to treat or prevent imminent or life-threatening deterioration.  Critical care was time spent personally by me on the following activities: development of treatment plan with patient and/or surrogate as well as nursing, discussions with consultants, evaluation of patient's response to treatment, examination of patient, obtaining history from patient or surrogate, ordering and performing treatments and interventions, ordering and review of laboratory studies, ordering and review of radiographic studies, pulse oximetry and re-evaluation of patient's condition.  Claudia Wiley was evaluated in Emergency Department on 09/09/2019 for the symptoms described in the history of present illness. She was evaluated in the context of the global COVID-19 pandemic, which necessitated consideration that the patient might be at risk for infection with the SARS-CoV-2 virus that causes COVID-19. Institutional protocols and algorithms that pertain to the evaluation of patients at risk for COVID-19 are in a state of rapid change based on information released by regulatory bodies including the CDC and federal and state organizations. These policies and algorithms were followed during the patient's care in the ED.    Final Clinical Impression(s) / ED Diagnoses Final diagnoses:  COPD with acute  exacerbation ([place])  Acute on chronic respiratory failure with hypoxia and hypercapnia ([place])    Rx / DC Orders ED Discharge Orders    None       Isla Pence, MD 09/09/19 1753

## 2019-09-09 NOTE — ED Notes (Signed)
1st contact with patient. Pt is a/o vss in no acute distress. Pt on nebulizer treatment O2 sat 100 percent.

## 2019-09-09 NOTE — ED Notes (Signed)
Montey Hora, daughter would like and update on her mother, [phone].

## 2019-09-09 NOTE — Progress Notes (Signed)
Attempted multiple times to place pt on bipap. Pt unable to tolerate it. Placed back on 4l nasal cannula and started continuous neb. md aware

## 2019-09-09 NOTE — Progress Notes (Signed)
Asked by Dr Verlon Au to assess pt at bedside. On assessment, pt is sitting up in bed requesting to use the restroom. VSS and assessment unremarkable. Oxygen saturation leves 95-100% on 4L [place].Pt has no complaints at this time.  Discussed POC with bedside RN and RT. Will continue to keep her on [place] as long as she remains stable. ABG in am.   Lovey Newcomer, NP [facility] 7p-7a [phone]

## 2019-09-09 NOTE — H&P (Signed)
HPI  Claudia Wiley [id number] DOB: [birth year] DOA: 09/09/2019  PCP: System, Pcp Not In   Chief Complaint: Shortness of breath  HPI:  40 white female anemia osteoarthritis esophageal stricture status post dilatation 02/10/2018 [place] [facility] reflux Gastrinoma status post Whipple procedure, depression Recent hospitalization 06/29/2019 through 07/01/2019 dyspnea wheezing coronavirus negative she was treated for COPD exacerbation   Patient presented emotionally labile state arm gives me some of the history-from what I can gather she has been coughing and vomiting posttussive [place] for the past several days and came into the hospital because she could not breathe-she has not had any chest pain or fever She tells me that she just cannot get her breath O2 sat was not picking up accurately noting sometimes 46% because her fingers were cold but with the Dinamap it was 96%  According to daughter was vomiting and count keep food down since tuesday and couldn't eat all day and couldn't eat even soup--the [facility] on the [facility] [place] [facility]--had a gastrinoma removed--also had a lobectomy of the LUL   Review of Systems:  As above  Pertinent +'s: Pertinent -"s:  ED Course: Patient was given albuterol nebs DuoNeb magnesium 1 dose of Solu-Medrol and and a blood gas was obtained which was venous This showed patient had a pH of 7.18 PO2 less than 31 PCO2 104   Past Medical History:  Diagnosis Date  . Anemia   . Arthritis   . Cancer [facility]) 2013   Upper left lung and stomach cancer  . COPD (chronic obstructive pulmonary disease) ([place])   . Esophageal stricture   . Family history of brain cancer   . Family history of melanoma   . Gastrinoma   . Gastrinoma   . GERD (gastroesophageal reflux disease)   . Hypertension   . Osteoporosis   . Pancreatic insufficiency   . Pneumonia   . Vitamin D deficiency   . Weight loss, unintentional    Past Surgical History:  Procedure Laterality  Date  . ABDOMINAL HYSTERECTOMY    . BREAST LUMPECTOMY    . CESAREAN SECTION    . LUNG SURGERY      reports that she quit smoking about 3 months ago. Her smoking use included cigarettes. She has a 0.50 pack-year smoking history. She has never used smokeless tobacco. She reports that she does not drink alcohol or use drugs.  Mobility: Independent at baseline  Allergies  Allergen Reactions  . Azithromycin   . Gabapentin    Family History  Problem Relation Age of Onset  . Diabetes Sister        x 3  . Cancer Sister 66       brain that metastized to bone, stomach and other organs  . Melanoma Maternal Aunt   . Heart attack Father   . Pneumonia Brother   . Cancer Other 17       cancer in his chest - nephew  . Colon cancer Neg Hx   . Pancreatic cancer Neg Hx   . Rectal cancer Neg Hx   . Esophageal cancer Neg Hx    Prior to Admission medications   Medication Sig Start Date End Date Taking? Authorizing Provider  acetaminophen (TYLENOL) 500 MG tablet Take 1,000 mg by mouth every 6 (six) hours as needed for moderate pain or headache.   Yes [provider]  albuterol (PROVENTIL HFA;VENTOLIN HFA) 108 (90 Base) MCG/ACT inhaler Inhale into the lungs every 6 (six) hours  as needed for wheezing or shortness of breath.   Yes [provider]  amitriptyline (ELAVIL) 10 MG tablet Take 10 mg by mouth at bedtime.   Yes [provider]  amLODipine (NORVASC) 2.5 MG tablet Take 2.5 mg by mouth daily as needed (high blood pressure).    Yes [provider]  budesonide-formoterol (SYMBICORT) 160-4.5 MCG/ACT inhaler Inhale 1 puff into the lungs daily. 06/18/18  Yes Regalado, Belkys A, MD  cholecalciferol (VITAMIN D) 400 units TABS tablet Take 800 Units by mouth.   Yes [provider]  ENSURE (ENSURE) Take 237 mLs by mouth 4 (four) times daily. Between Meals.   Yes [provider]  lipase/protease/amylase (CREON) 12000 units CPEP capsule Take 24,000 Units  by mouth 4 (four) times daily.    Yes [provider]  mirtazapine (REMERON) 15 MG tablet Take 15 mg by mouth at bedtime.   Yes [provider]  Multiple Vitamin (MULTIVITAMIN) tablet Take 1 tablet by mouth daily.   Yes [provider]  pantoprazole (PROTONIX) 40 MG tablet Take 40 mg by mouth daily.   Yes [provider]  polyethylene glycol (MIRALAX / GLYCOLAX) 17 g packet Take 17 g by mouth daily as needed for moderate constipation. 04/06/19  Yes Oretha Milch D, MD  pregabalin (LYRICA) 150 MG capsule Take 150 mg by mouth 2 (two) times daily.   Yes [provider]  tiotropium (SPIRIVA) 18 MCG inhalation capsule Place 18 mcg into inhaler and inhale daily.   Yes [provider]  tiZANidine (ZANAFLEX) 4 MG capsule Take 2 mg by mouth at bedtime.    Yes [provider]  albuterol (PROVENTIL HFA;VENTOLIN HFA) 108 (90 Base) MCG/ACT inhaler Inhale 2 puffs into the lungs every 6 (six) hours as needed for wheezing or shortness of breath. Patient not taking: Reported on 04/02/2019 06/18/18   Regalado, Jerald Kief A, MD  guaiFENesin (MUCINEX) 600 MG 12 hr tablet Take 1 tablet (600 mg total) by mouth 2 (two) times daily. Patient not taking: Reported on 04/02/2019 06/18/18   Regalado, Jerald Kief A, MD  predniSONE (DELTASONE) 20 MG tablet Take 2 tablets (40 mg total) by mouth daily. Patient not taking: Reported on 09/09/2019 07/02/19   Shelly Coss, MD    Physical Exam:  Vitals:   09/09/19 1630 09/09/19 1736  BP: 113/84 131/82  Pulse: (!) 103 99  Resp: 14 12  Temp:    SpO2: 100% 100%     Awake alert coherent quite tearful and emotionally labile  No rales no rhonchi chest seems clear no added sound  Abdomen soft no rebound no guarding except in epigastrium where she is a little tender  She is quite asthenic and frail  No lower extremity edema  No JVD  Moving all 4 limbs equally  No LAM    I have personally reviewed following labs and  imaging studies  Labs:   Potassium 5.5  BUN/creatinine up from baseline 19/0.537/0.9 CO2 34 BNP 85  WBC 8.4 hemoglobin 13  Imaging studies:   CXR shows hyperexpansion of the lungs postoperative changes on the left with clips in the perihilar region  Medical tests:   EKG independently reviewed: EKG shows sinus tach LVH incomplete left bundle  Test discussed with performing physician:  Discussed with Dr. Gilford Raid  Decision to obtain old records:   Yes  Review and summation of old records:   Yes   Assessment/Plan Probable acute COPD exacerbation with hypoxia and hypercarbia Agree with BiPAP-transfer to stepdown Get  blood gas ABG to accurately determine level of hypoxia and acidosis and may need pulmonary critical care input Will repeat x-ray in a.m. if needed Continue Spiriva 18 mcg we will give duo nebs around-the-clock with Symbicort inhalers Give steroids 60 mg Solu-Medrol every 8 Abd pain Get Stat CT scan abd pelvis to r/o blockage vs recurrence--she was scheduled to go back to [place] for this in July This is actually the main reason she came over to the hospital Daughter tells me that she has not had many recurrences and was supposed to get work-up for this as above AKI Start NS 100 cc per hour for now as this is likely prerenal azotemia from poor p.o. intake since Monday Anxiety At this stage would hold on any sedating agents unless absolutely necessary Chronic pain See above-discontinue Lyrica 150 twice daily, tizanidine at bedtime Prior esophagitis/esophageal stricture Hold pantoprazole for now Prior gastrinoma status post Whipple Details unclear monitor trends Depression Continue amitriptyline 10, Remeron 15   Severity of Illness:  DVT prophylaxis: Lovenox Code Status:  full Family Communication: None Consults called: None yet  Time spent: 58 minutes  Verlon Au, MD Jerl Mina my NP partners at night for Care related issues] [facility] --Via  [employer] [facility] , [email or web address]; password [facility]  09/09/2019, 5:44 PM

## 2019-09-10 ENCOUNTER — Inpatient Hospital Stay (HOSPITAL_COMMUNITY): Payer: No Typology Code available for payment source

## 2019-09-10 DIAGNOSIS — R112 Nausea with vomiting, unspecified: Secondary | ICD-10-CM

## 2019-09-10 DIAGNOSIS — J9621 Acute and chronic respiratory failure with hypoxia: Secondary | ICD-10-CM

## 2019-09-10 DIAGNOSIS — K222 Esophageal obstruction: Secondary | ICD-10-CM

## 2019-09-10 DIAGNOSIS — J441 Chronic obstructive pulmonary disease with (acute) exacerbation: Secondary | ICD-10-CM

## 2019-09-10 DIAGNOSIS — R1310 Dysphagia, unspecified: Secondary | ICD-10-CM

## 2019-09-10 DIAGNOSIS — R1319 Other dysphagia: Secondary | ICD-10-CM

## 2019-09-10 DIAGNOSIS — J9622 Acute and chronic respiratory failure with hypercapnia: Secondary | ICD-10-CM

## 2019-09-10 DIAGNOSIS — K209 Esophagitis, unspecified without bleeding: Secondary | ICD-10-CM

## 2019-09-10 LAB — URINALYSIS, ROUTINE W REFLEX MICROSCOPIC
Bilirubin Urine: NEGATIVE
Glucose, UA: NEGATIVE mg/dL
Hgb urine dipstick: NEGATIVE
Ketones, ur: 20 mg/dL — AB
Leukocytes,Ua: NEGATIVE
Nitrite: NEGATIVE
Protein, ur: NEGATIVE mg/dL
Specific Gravity, Urine: 1.024 (ref 1.005–1.030)
pH: 7 (ref 5.0–8.0)

## 2019-09-10 LAB — CBC
HCT: 43.7 % (ref 36.0–46.0)
Hemoglobin: 13.8 g/dL (ref 12.0–15.0)
MCH: 28.9 pg (ref 26.0–34.0)
MCHC: 31.6 g/dL (ref 30.0–36.0)
MCV: 91.4 fL (ref 80.0–100.0)
Platelets: 253 10*3/uL (ref 150–400)
RBC: 4.78 MIL/uL (ref 3.87–5.11)
RDW: 15.1 % (ref 11.5–15.5)
WBC: 36.7 10*3/uL — ABNORMAL HIGH (ref 4.0–10.5)
nRBC: 0 % (ref 0.0–0.2)

## 2019-09-10 LAB — COMPREHENSIVE METABOLIC PANEL
ALT: 17 U/L (ref 0–44)
AST: 22 U/L (ref 15–41)
Albumin: 3.5 g/dL (ref 3.5–5.0)
Alkaline Phosphatase: 82 U/L (ref 38–126)
Anion gap: 11 (ref 5–15)
BUN: 28 mg/dL — ABNORMAL HIGH (ref 8–23)
CO2: 29 mmol/L (ref 22–32)
Calcium: 8.3 mg/dL — ABNORMAL LOW (ref 8.9–10.3)
Chloride: 98 mmol/L (ref 98–111)
Creatinine, Ser: 0.55 mg/dL (ref 0.44–1.00)
GFR calc Af Amer: >60 mL/min (ref >60)
GFR calc non Af Amer: >60 mL/min (ref >60)
Glucose, Bld: 155 mg/dL — ABNORMAL HIGH (ref 70–99)
Potassium: 4.6 mmol/L (ref 3.5–5.1)
Sodium: 138 mmol/L (ref 135–145)
Total Bilirubin: 0.4 mg/dL (ref 0.3–1.2)
Total Protein: 6.6 g/dL (ref 6.5–8.1)

## 2019-09-10 LAB — HIV ANTIBODY (ROUTINE TESTING W REFLEX): HIV Screen 4th Generation wRfx: NONREACTIVE

## 2019-09-10 MED ORDER — DIPHENHYDRAMINE HCL 50 MG/ML IJ SOLN
6.2500 mg | Freq: Three times a day (TID) | INTRAMUSCULAR | Status: DC | PRN
  Administered 2019-09-10 – 2019-09-12 (×4): 6.5 mg via INTRAVENOUS
  Filled 2019-09-10 (×4): qty 1

## 2019-09-10 MED ORDER — METRONIDAZOLE IN NACL 5-0.79 MG/ML-% IV SOLN
500.0000 mg | Freq: Three times a day (TID) | INTRAVENOUS | Status: DC
  Administered 2019-09-10 – 2019-09-16 (×18): 500 mg via INTRAVENOUS
  Filled 2019-09-10 (×18): qty 100

## 2019-09-10 MED ORDER — HYDROMORPHONE HCL 1 MG/ML IJ SOLN: 0.2500 mg | INTRAMUSCULAR | Status: DC | PRN

## 2019-09-10 MED ORDER — HYDROMORPHONE HCL 1 MG/ML IJ SOLN: 0.5000 mg | INTRAMUSCULAR | Status: DC | PRN

## 2019-09-10 MED ORDER — ALPRAZOLAM 0.5 MG PO TABS
0.5000 mg | ORAL_TABLET | Freq: Three times a day (TID) | ORAL | Status: DC | PRN
  Administered 2019-09-10 – 2019-09-18 (×11): 0.5 mg via ORAL
  Filled 2019-09-10 (×11): qty 1

## 2019-09-10 MED ORDER — PNEUMOCOCCAL VAC POLYVALENT 25 MCG/0.5ML IJ INJ
0.5000 mL | INJECTION | INTRAMUSCULAR | Status: DC | PRN
  Filled 2019-09-10: qty 0.5

## 2019-09-10 MED ORDER — PANTOPRAZOLE SODIUM 40 MG IV SOLR
40.0000 mg | Freq: Two times a day (BID) | INTRAVENOUS | Status: DC
  Administered 2019-09-10 – 2019-09-19 (×19): 40 mg via INTRAVENOUS
  Filled 2019-09-10 (×21): qty 40

## 2019-09-10 MED ORDER — HYDROMORPHONE HCL 1 MG/ML IJ SOLN
0.2500 mg | Freq: Four times a day (QID) | INTRAMUSCULAR | Status: DC | PRN
  Administered 2019-09-10 – 2019-09-14 (×12): 0.25 mg via INTRAVENOUS
  Filled 2019-09-10: qty 0.5
  Filled 2019-09-10: qty 1
  Filled 2019-09-10: qty 0.5
  Filled 2019-09-10: qty 1
  Filled 2019-09-10: qty 0.5
  Filled 2019-09-10: qty 1
  Filled 2019-09-10 (×3): qty 0.5
  Filled 2019-09-10 (×3): qty 1

## 2019-09-10 MED ORDER — TRAMADOL HCL 50 MG PO TABS
50.0000 mg | ORAL_TABLET | Freq: Three times a day (TID) | ORAL | Status: DC | PRN
  Administered 2019-09-10 – 2019-09-17 (×14): 50 mg via ORAL
  Filled 2019-09-10 (×14): qty 1

## 2019-09-10 NOTE — Progress Notes (Signed)
Pt. Does not want to try Bipap at this time.  Patient is stable without distress noted.  Vital signs wnl.  Will continue to monitor.

## 2019-09-10 NOTE — H&P (View-Only) (Signed)
Referring Provider:  [facility]         Primary Care Physician:  System, Pcp Not In Primary Gastroenterologist:  Dr. Fuller Plan         We were asked to see this patient for:    Esophageal stricture              ASSESSMENT /  PLAN    64 yo female with pmh significant for Whipple procedure for gastrinoma in 2015 in [place], GERD / esophageal stricture, COPD, HTN, OA  # Vomiting / refractory heartburn /  fluid filled, edematous esophagus on CT scan.  --rule out recurrent esophageal stricture -keep NPO for now. At risk for aspiration with increased esophageal debris -=BID IV PPI -=prn anti-emetic -=Will need EGD at some point. This was discussed with daughter and patient. Need to make sure there has been adequate time for esophagus to be clear of any fluid / debris to reduce aspiration risk.    # Epigastric pain . Suspect related to distal esophagitis on imaging.  --no pancreas / liver / gallbladder findings on CT scan w/ contrast  # Abnormal colon on CT scan, doubt obstruction / mass -Moderate stool in colon with transition point in transverse colon without mass. Denies constipation. Last BM was Tuesday and apparently normal.  --Reportedly normal colonoscopy in [place] within last 3 years  # Leukocytosis, WBC 36K -? Etiology - U/A and CXR negative.  -Getting flagyl ( GI coverage) and maxipime ( respiratory coverage)  # Acute respiratory failure secondary to COPD exacerbation   # History of Whipple procedure for gastrinoma in 2015  # AKI, improving  # COPD   HPI:    Chief Complaint: abdominal pain, vomiting, dysphagia  Claudia Wiley is a 64 y.o. female evaluated by [place] June 2019 for dysphagia. Patient has a history of esophageal strictures, s/p dilation at [facility] around 2011. We saw her in June 2019 for dysphagia. EGD July 2019 with findings of esophageal stenosis, s/p Savary dilation.    Patient has been having recurrent solid food dysphagia " for a  while" Per daughter patient lost ~ 10 pounds over last few months.  Hospitalized in December with COPD exacerbation , severe protein calorie nutrition. She was apparently having abdominal pain during that admission but focus was more on COPD per daughter. At home she has continued to complain of upper abdominal pain radiating through to her back.  On Monday the pain seemed to get worse , became more constant and associated with nausea / vomiting. She tried Tylenol and anti-emetics before coming to the hospital..  Per daughter, patient has also been taking large amounts of Mylanta for heartburn despite daily PPI and Pepcid.   Data Reviewed:   WBC 35K, hgb 13.8 BUN 37 >> 29 Cr 0.55 Liver tests normal  EGD 02/10/18 Benign-appearing esophageal stenosis. Dilated. - Benign-appearing esophageal stenosis. Dilated. - LA Grade B reflux esophagitis. - Small hiatal hernia. - Normal duodenal bulb and second portion of the duodenum. - No specimens collected.  CT scan w/ contrast-   Fluid filled distal esophagus with submucosal edema and inflammation along distal esophagus c/w esophagitis. Moderate stool volume in asc and prox transverse colon with transition point in mid transvere colon without obstructing lesion  CXR - no acute findings   Past Medical History:  Diagnosis Date  . Anemia   . Arthritis   . Cancer [facility]) 2013   Upper left lung and stomach cancer  . COPD (chronic obstructive pulmonary  disease) ([place])   . Esophageal stricture   . Family history of brain cancer   . Family history of melanoma   . Gastrinoma   . Gastrinoma   . GERD (gastroesophageal reflux disease)   . Hypertension   . Osteoporosis   . Pancreatic insufficiency   . Pneumonia   . Vitamin D deficiency   . Weight loss, unintentional     Past Surgical History:  Procedure Laterality Date  . ABDOMINAL HYSTERECTOMY    . BREAST LUMPECTOMY    . CESAREAN SECTION    . LUNG SURGERY      Prior to Admission medications     Medication Sig Start Date End Date Taking? Authorizing Provider  acetaminophen (TYLENOL) 500 MG tablet Take 1,000 mg by mouth every 6 (six) hours as needed for moderate pain or headache.   Yes [provider]  albuterol (PROVENTIL HFA;VENTOLIN HFA) 108 (90 Base) MCG/ACT inhaler Inhale into the lungs every 6 (six) hours as needed for wheezing or shortness of breath.   Yes [provider]  amitriptyline (ELAVIL) 10 MG tablet Take 10 mg by mouth at bedtime.   Yes [provider]  amLODipine (NORVASC) 2.5 MG tablet Take 2.5 mg by mouth daily as needed (high blood pressure).    Yes [provider]  budesonide-formoterol (SYMBICORT) 160-4.5 MCG/ACT inhaler Inhale 1 puff into the lungs daily. 06/18/18  Yes Regalado, Belkys A, MD  cholecalciferol (VITAMIN D) 400 units TABS tablet Take 800 Units by mouth.   Yes [provider]  ENSURE (ENSURE) Take 237 mLs by mouth 4 (four) times daily. Between Meals.   Yes [provider]  lipase/protease/amylase (CREON) 12000 units CPEP capsule Take 24,000 Units by mouth 4 (four) times daily.    Yes [provider]  mirtazapine (REMERON) 15 MG tablet Take 15 mg by mouth at bedtime.   Yes [provider]  Multiple Vitamin (MULTIVITAMIN) tablet Take 1 tablet by mouth daily.   Yes [provider]  pantoprazole (PROTONIX) 40 MG tablet Take 40 mg by mouth daily.   Yes [provider]  polyethylene glycol (MIRALAX / GLYCOLAX) 17 g packet Take 17 g by mouth daily as needed for moderate constipation. 04/06/19  Yes Oretha Milch D, MD  pregabalin (LYRICA) 150 MG capsule Take 150 mg by mouth 2 (two) times daily.   Yes [provider]  tiotropium (SPIRIVA) 18 MCG inhalation capsule Place 18 mcg into inhaler and inhale daily.   Yes [provider]  tiZANidine (ZANAFLEX) 4 MG capsule Take 2 mg by mouth at bedtime.    Yes [provider]  albuterol (PROVENTIL HFA;VENTOLIN  HFA) 108 (90 Base) MCG/ACT inhaler Inhale 2 puffs into the lungs every 6 (six) hours as needed for wheezing or shortness of breath. Patient not taking: Reported on 04/02/2019 06/18/18   Regalado, Jerald Kief A, MD  guaiFENesin (MUCINEX) 600 MG 12 hr tablet Take 1 tablet (600 mg total) by mouth 2 (two) times daily. Patient not taking: Reported on 04/02/2019 06/18/18   Regalado, Jerald Kief A, MD  predniSONE (DELTASONE) 20 MG tablet Take 2 tablets (40 mg total) by mouth daily. Patient not taking: Reported on 09/09/2019 07/02/19   Shelly Coss, MD    Current Facility-Administered Medications  Medication Dose Route Frequency Provider Last Rate Last Admin  . 0.9 %  sodium chloride infusion   Intravenous Continuous Nita Sells, MD 100 mL/hr at 09/10/19 1258 Rate Verify at 09/10/19 1258  . ALPRAZolam (XANAX) tablet 0.5 mg  0.5 mg Oral TID PRN Georgette Shell, MD   0.5 mg at 09/10/19 0900  . amitriptyline (ELAVIL) tablet 10 mg  10 mg Oral QHS Nita Sells, MD   10 mg at 09/09/19 2218  . ceFEPIme (MAXIPIME) 2 g in sodium chloride 0.9 % 100 mL IVPB  2 g Intravenous BID Nita Sells, MD   Stopped at 09/10/19 1020  . Chlorhexidine Gluconate Cloth 2 % PADS 6 each  6 each Topical Daily Nita Sells, MD   6 each at 09/09/19 2220  . enoxaparin (LOVENOX) injection 30 mg  30 mg Subcutaneous QHS Nita Sells, MD   30 mg at 09/09/19 2220  . guaiFENesin (MUCINEX) 12 hr tablet 600 mg  600 mg Oral BID Nita Sells, MD   600 mg at 09/10/19 0902  . HYDROmorphone (DILAUDID) injection 0.25 mg  0.25 mg Intravenous Q6H PRN Georgette Shell, MD   0.25 mg at 09/10/19 1339  . ipratropium-albuterol (DUONEB) 0.5-2.5 (3) MG/3ML nebulizer solution 3 mL  3 mL Nebulization Once Isla Pence, MD   Stopped at 09/09/19 1605  . ipratropium-albuterol (DUONEB) 0.5-2.5 (3) MG/3ML nebulizer solution 3 mL  3 mL Nebulization TID Nita Sells, MD   3 mL at 09/10/19 0752  .  lipase/protease/amylase (CREON) capsule 24,000 Units  24,000 Units Oral TID WC & HS Nita Sells, MD   24,000 Units at 09/10/19 1256  . MEDLINE mouth rinse  15 mL Mouth Rinse BID Nita Sells, MD   15 mL at 09/10/19 0907  . methylPREDNISolone sodium succinate (SOLU-MEDROL) 125 mg/2 mL injection 60 mg  60 mg Intravenous Q6H Nita Sells, MD   60 mg at 09/10/19 1255   Followed by  . [START ON 09/11/2019] predniSONE (DELTASONE) tablet 40 mg  40 mg Oral Q breakfast Samtani, Jai-Gurmukh, MD      . metroNIDAZOLE (FLAGYL) IVPB 500 mg  500 mg Intravenous Q8H Georgette Shell, MD      . mirtazapine (REMERON) tablet 15 mg  15 mg Oral QHS Nita Sells, MD   15 mg at 09/09/19 2216  . pantoprazole (PROTONIX) injection 40 mg  40 mg Intravenous Q12H Georgette Shell, MD   40 mg at 09/10/19 1339  . pneumococcal 23 valent vaccine (PNEUMOVAX-23) injection 0.5 mL  0.5 mL Intramuscular Prior to discharge Georgette Shell, MD      . traMADol Veatrice Bourbon) tablet 50 mg  50 mg Oral TID PRN Georgette Shell, MD   50 mg at 09/10/19 0901    Allergies as of 09/09/2019 - Review Complete 09/09/2019  Allergen Reaction Noted  . Azithromycin  11/11/2016  . Gabapentin  11/11/2016    Family History  Problem Relation Age of Onset  . Diabetes Sister        x 3  . Cancer Sister 44       brain that metastized to bone, stomach and other organs  . Melanoma Maternal Aunt   . Heart attack Father   . Pneumonia Brother   . Cancer Other 17       cancer in his chest - nephew  . Colon cancer Neg Hx   . Pancreatic cancer Neg Hx   . Rectal cancer Neg Hx   . Esophageal cancer Neg Hx     Social History   Socioeconomic History  . Marital status: Single    Spouse name: Not on file  . Number of children: Not on file  . Years of education: Not on file  . Highest education  level: Not on file  Occupational History  . Not on file  Tobacco Use  . Smoking status: Former Smoker     Packs/day: 0.25    Years: 2.00    Pack years: 0.50    Types: Cigarettes    Quit date: 05/30/2019    Years since quitting: 0.2  . Smokeless tobacco: Never Used  . Tobacco comment: Not ready  Substance and Sexual Activity  . Alcohol use: No  . Drug use: No  . Sexual activity: Never  Other Topics Concern  . Not on file  Social History Narrative  . Not on file   Social Determinants of Health   Financial Resource Strain:   . Difficulty of Paying Living Expenses: Not on file  Food Insecurity:   . Worried About Charity fundraiser in the Last Year: Not on file  . Ran Out of Food in the Last Year: Not on file  Transportation Needs:   . Lack of Transportation (Medical): Not on file  . Lack of Transportation (Non-Medical): Not on file  Physical Activity:   . Days of Exercise per Week: Not on file  . Minutes of Exercise per Session: Not on file  Stress:   . Feeling of Stress : Not on file  Social Connections:   . Frequency of Communication with Friends and Family: Not on file  . Frequency of Social Gatherings with Friends and Family: Not on file  . Attends Religious Services: Not on file  . Active Member of Clubs or Organizations: Not on file  . Attends Archivist Meetings: Not on file  . Marital Status: Not on file  Intimate Partner Violence:   . Fear of Current or Ex-Partner: Not on file  . Emotionally Abused: Not on file  . Physically Abused: Not on file  . Sexually Abused: Not on file    Review of Systems: All systems reviewed and negative except where noted in HPI.  Physical Exam: Vital signs in last 24 hours: Temp:  [97.8 F (36.6 C)-99.1 F (37.3 C)] 99.1 F (37.3 C) (02/26 1236) Pulse Rate:  [60-118] 107 (02/26 1300) Resp:  [10-19] 11 (02/26 1300) BP: (113-169)/(72-104) 155/84 (02/26 1300) SpO2:  [81 %-100 %] 100 % (02/26 1300) Weight:  [36.3 kg-37.7 kg] 37.7 kg (02/25 2300) Last BM Date: (unknown) General:   Alert, thin female in NAD Psych:   Pleasant, cooperative. Normal mood and affect. Eyes:  Pupils equal, sclera clear, no icterus.   Conjunctiva pink. Ears:  Normal auditory acuity. Nose:  No deformity, discharge,  or lesions. Neck:  Supple; no masses Lungs:  Clear throughout to auscultation.   No wheezes, crackles, or rhonchi.  Heart:  Regular rate and rhythm; no murmurs, no lower extremity edema Abdomen:  Soft, non-distended, mild epigastric tenderness, BS active, no palp mass   Rectal:  Deferred  Msk:  Symmetrical without gross deformities. . Neurologic:  Alert and  oriented x4;  grossly normal neurologically. Skin:  Intact without significant lesions or rashes.   Intake/Output from previous day: 02/25 0701 - 02/26 0700 In: 378.7 [P.O.:60; I.V.:218.8; IV Piggyback:99.8] Out: 750 [Urine:750] Intake/Output this shift: Total I/O In: 842.3 [P.O.:120; I.V.:653.7; IV Piggyback:68.6] Out: 450 [Urine:450]  Lab Results: Recent Labs    09/09/19 1637 09/10/19 0215  WBC 31.9* 36.7*  HGB 15.6* 13.8  HCT 49.7* 43.7  PLT 247 253   BMET Recent Labs    09/09/19 1507 09/10/19 0215  NA 142 138  K 5.5* 4.6  CL  92* 98  CO2 34* 29  GLUCOSE 90 155*  BUN 37* 28*  CREATININE 0.94 0.55  CALCIUM 9.7 8.3*   LFT Recent Labs    09/10/19 0215  PROT 6.6  ALBUMIN 3.5  AST 22  ALT 17  ALKPHOS 82  BILITOT 0.4   PT/INR No results for input(s): LABPROT, INR in the last 72 hours. Hepatitis Panel No results for input(s): HEPBSAG, HCVAB, HEPAIGM, HEPBIGM in the last 72 hours.   . CBC Latest Ref Rng & Units 09/10/2019 09/09/2019 06/30/2019  WBC 4.0 - 10.5 K/uL 36.7(H) 31.9(H) 8.4  Hemoglobin 12.0 - 15.0 g/dL 13.8 15.6(H) 13.3  Hematocrit 36.0 - 46.0 % 43.7 49.7(H) 42.3  Platelets 150 - 400 K/uL 253 247 276    . CMP Latest Ref Rng & Units 09/10/2019 09/09/2019 06/30/2019  Glucose 70 - 99 mg/dL 155(H) 90 113(H)  BUN 8 - 23 mg/dL 28(H) 37(H) 19  Creatinine 0.44 - 1.00 mg/dL 0.55 0.94 0.55  Sodium 135 - 145 mmol/L 138 142  139  Potassium 3.5 - 5.1 mmol/L 4.6 5.5(H) 4.2  Chloride 98 - 111 mmol/L 98 92(L) 96(L)  CO2 22 - 32 mmol/L 29 34(H) 33(H)  Calcium 8.9 - 10.3 mg/dL 8.3(L) 9.7 8.5(L)  Total Protein 6.5 - 8.1 g/dL 6.6 - -  Total Bilirubin 0.3 - 1.2 mg/dL 0.4 - -  Alkaline Phos 38 - 126 U/L 82 - -  AST 15 - 41 U/L 22 - -  ALT 0 - 44 U/L 17 - -   Studies/Results: DG Chest 2 View  Result Date: 09/10/2019 CLINICAL DATA:  Shortness of breath EXAM: CHEST - 2 VIEW COMPARISON:  09/09/2019 FINDINGS: The heart size and mediastinal contours are within normal limits. Rounded nodular densities projecting over the bilateral lung fields suggestive of nipple shadows. Lungs are mildly hyperexpanded. Mild left basilar scarring, unchanged. Both lungs are clear. The visualized skeletal structures are unremarkable. Abdominal aortic atherosclerotic calcifications noted. IMPRESSION: No acute cardiopulmonary disease. Mild hyperexpansion of the lungs. Abdominal aortic atherosclerosis. Electronically Signed   By: Davina Poke D.O.   On: 09/10/2019 10:16   CT ABDOMEN PELVIS W CONTRAST  Result Date: 09/09/2019 CLINICAL DATA:  Acute abdominal pain. Neutropenia. COPD. Lung cancer. EXAM: CT ABDOMEN AND PELVIS WITH CONTRAST TECHNIQUE: Multidetector CT imaging of the abdomen and pelvis was performed using the standard protocol following bolus administration of intravenous contrast. CONTRAST:  33mL OMNIPAQUE IOHEXOL 300 MG/ML  SOLN COMPARISON:  CT 12/27/2012 FINDINGS: Lower chest: Lung bases are clear. Hepatobiliary: No focal hepatic lesion. No biliary duct dilatation. Gallbladder is normal. Common bile duct is normal. Pancreas: Pancreas is normal. No ductal dilatation. No pancreatic inflammation. Spleen: Normal spleen Adrenals/urinary tract: Adrenal glands and kidneys are normal. The ureters and bladder normal. Stomach/Bowel: Fluid-filled distal esophagus. There is enhancement of the esophagus and submucosal edema in distal esophagus as well  as inflammation in the adjacent fat along the distal esophagus (image 1 through 9 of series 2 and coronal image 53/5). Stomach, small-bowel normal. Terminal ileum normal. Appendix not identified. Moderate volume stool in the ascending colon proximal transverse colon. Distal transverse and descending colon are collapsed. There is a transition point from the stool-filled RIGHT colon to the collapsed LEFT colon which occurs in the mid transverse colon (image 23/5); however, there is no discrete lesion identified at this level Vascular/Lymphatic: Abdominal aorta is normal caliber with atherosclerotic calcification. There is no retroperitoneal or periportal lymphadenopathy. No pelvic lymphadenopathy. Reproductive: Post hysterectomy Other: No free fluid. Musculoskeletal:  No aggressive osseous lesion. IMPRESSION: 1. Fluid-filled distal esophagus with submucosal edema and inflammation along the distal esophagus. Findings are consistent with esophagitis. 2. Moderate volume stool in the ascending colon and proximal transverse colon with transition point in the mid transverse colon. No obstructing lesion identified; however, recommend colonoscopy to exclude underlying lesion. 3. Atherosclerotic calcification of the aorta. Electronically Signed   By: Suzy Bouchard M.D.   On: 09/09/2019 19:04   DG Chest Port 1 View  Result Date: 09/09/2019 CLINICAL DATA:  Shortness of breath. Reported history of lung carcinoma EXAM: PORTABLE CHEST 1 VIEW COMPARISON:  June 29, 2019 FINDINGS: Postoperative change noted in the left perihilar region. Lungs are somewhat hyperexpanded, stable. No edema or airspace opacity. No adenopathy or mass. Heart size normal. No bone lesions. IMPRESSION: Lungs somewhat hyperexpanded, stable. Postoperative change on the left with clips in the left perihilar region. No edema or airspace opacity. No mass or adenopathy. Heart size normal. Electronically Signed   By: Lowella Grip III M.D.   On:  09/09/2019 15:28    Active Problems:   COPD with acute exacerbation ([place])   Respiratory failure ([place])    Tye Savoy, NP-C @  09/10/2019, 1:44 PM

## 2019-09-10 NOTE — Consult Note (Signed)
Referring Provider:  [facility]         Primary Care Physician:  System, Pcp Not In Primary Gastroenterologist:  Dr. Fuller Plan         We were asked to see this patient for:    Esophageal stricture              ASSESSMENT /  PLAN    64 yo female with pmh significant for Whipple procedure for gastrinoma in 2015 in [place], GERD / esophageal stricture, COPD, HTN, OA  # Vomiting / refractory heartburn /  fluid filled, edematous esophagus on CT scan.  --rule out recurrent esophageal stricture -keep NPO for now. At risk for aspiration with increased esophageal debris -=BID IV PPI -=prn anti-emetic -=Will need EGD at some point. This was discussed with daughter and patient. Need to make sure there has been adequate time for esophagus to be clear of any fluid / debris to reduce aspiration risk.    # Epigastric pain . Suspect related to distal esophagitis on imaging.  --no pancreas / liver / gallbladder findings on CT scan w/ contrast  # Abnormal colon on CT scan, doubt obstruction / mass -Moderate stool in colon with transition point in transverse colon without mass. Denies constipation. Last BM was Tuesday and apparently normal.  --Reportedly normal colonoscopy in [place] within last 3 years  # Leukocytosis, WBC 36K -? Etiology - U/A and CXR negative.  -Getting flagyl ( GI coverage) and maxipime ( respiratory coverage)  # Acute respiratory failure secondary to COPD exacerbation   # History of Whipple procedure for gastrinoma in 2015  # AKI, improving  # COPD   HPI:    Chief Complaint: abdominal pain, vomiting, dysphagia  Claudia Wiley is a 64 y.o. female evaluated by [place] June 2019 for dysphagia. Patient has a history of esophageal strictures, s/p dilation at [facility] around 2011. We saw her in June 2019 for dysphagia. EGD July 2019 with findings of esophageal stenosis, s/p Savary dilation.    Patient has been having recurrent solid food dysphagia " for a  while" Per daughter patient lost ~ 10 pounds over last few months.  Hospitalized in December with COPD exacerbation , severe protein calorie nutrition. She was apparently having abdominal pain during that admission but focus was more on COPD per daughter. At home she has continued to complain of upper abdominal pain radiating through to her back.  On Monday the pain seemed to get worse , became more constant and associated with nausea / vomiting. She tried Tylenol and anti-emetics before coming to the hospital..  Per daughter, patient has also been taking large amounts of Mylanta for heartburn despite daily PPI and Pepcid.   Data Reviewed:   WBC 35K, hgb 13.8 BUN 37 >> 29 Cr 0.55 Liver tests normal  EGD 02/10/18 Benign-appearing esophageal stenosis. Dilated. - Benign-appearing esophageal stenosis. Dilated. - LA Grade B reflux esophagitis. - Small hiatal hernia. - Normal duodenal bulb and second portion of the duodenum. - No specimens collected.  CT scan w/ contrast-   Fluid filled distal esophagus with submucosal edema and inflammation along distal esophagus c/w esophagitis. Moderate stool volume in asc and prox transverse colon with transition point in mid transvere colon without obstructing lesion  CXR - no acute findings   Past Medical History:  Diagnosis Date  . Anemia   . Arthritis   . Cancer [facility]) 2013   Upper left lung and stomach cancer  . COPD (chronic obstructive pulmonary  disease) ([place])   . Esophageal stricture   . Family history of brain cancer   . Family history of melanoma   . Gastrinoma   . Gastrinoma   . GERD (gastroesophageal reflux disease)   . Hypertension   . Osteoporosis   . Pancreatic insufficiency   . Pneumonia   . Vitamin D deficiency   . Weight loss, unintentional     Past Surgical History:  Procedure Laterality Date  . ABDOMINAL HYSTERECTOMY    . BREAST LUMPECTOMY    . CESAREAN SECTION    . LUNG SURGERY      Prior to Admission medications     Medication Sig Start Date End Date Taking? Authorizing Provider  acetaminophen (TYLENOL) 500 MG tablet Take 1,000 mg by mouth every 6 (six) hours as needed for moderate pain or headache.   Yes [provider]  albuterol (PROVENTIL HFA;VENTOLIN HFA) 108 (90 Base) MCG/ACT inhaler Inhale into the lungs every 6 (six) hours as needed for wheezing or shortness of breath.   Yes [provider]  amitriptyline (ELAVIL) 10 MG tablet Take 10 mg by mouth at bedtime.   Yes [provider]  amLODipine (NORVASC) 2.5 MG tablet Take 2.5 mg by mouth daily as needed (high blood pressure).    Yes [provider]  budesonide-formoterol (SYMBICORT) 160-4.5 MCG/ACT inhaler Inhale 1 puff into the lungs daily. 06/18/18  Yes Regalado, Belkys A, MD  cholecalciferol (VITAMIN D) 400 units TABS tablet Take 800 Units by mouth.   Yes [provider]  ENSURE (ENSURE) Take 237 mLs by mouth 4 (four) times daily. Between Meals.   Yes [provider]  lipase/protease/amylase (CREON) 12000 units CPEP capsule Take 24,000 Units by mouth 4 (four) times daily.    Yes [provider]  mirtazapine (REMERON) 15 MG tablet Take 15 mg by mouth at bedtime.   Yes [provider]  Multiple Vitamin (MULTIVITAMIN) tablet Take 1 tablet by mouth daily.   Yes [provider]  pantoprazole (PROTONIX) 40 MG tablet Take 40 mg by mouth daily.   Yes [provider]  polyethylene glycol (MIRALAX / GLYCOLAX) 17 g packet Take 17 g by mouth daily as needed for moderate constipation. 04/06/19  Yes Oretha Milch D, MD  pregabalin (LYRICA) 150 MG capsule Take 150 mg by mouth 2 (two) times daily.   Yes [provider]  tiotropium (SPIRIVA) 18 MCG inhalation capsule Place 18 mcg into inhaler and inhale daily.   Yes [provider]  tiZANidine (ZANAFLEX) 4 MG capsule Take 2 mg by mouth at bedtime.    Yes [provider]  albuterol (PROVENTIL HFA;VENTOLIN  HFA) 108 (90 Base) MCG/ACT inhaler Inhale 2 puffs into the lungs every 6 (six) hours as needed for wheezing or shortness of breath. Patient not taking: Reported on 04/02/2019 06/18/18   Regalado, Jerald Kief A, MD  guaiFENesin (MUCINEX) 600 MG 12 hr tablet Take 1 tablet (600 mg total) by mouth 2 (two) times daily. Patient not taking: Reported on 04/02/2019 06/18/18   Regalado, Jerald Kief A, MD  predniSONE (DELTASONE) 20 MG tablet Take 2 tablets (40 mg total) by mouth daily. Patient not taking: Reported on 09/09/2019 07/02/19   Shelly Coss, MD    Current Facility-Administered Medications  Medication Dose Route Frequency Provider Last Rate Last Admin  . 0.9 %  sodium chloride infusion   Intravenous Continuous Nita Sells, MD 100 mL/hr at 09/10/19 1258 Rate Verify at 09/10/19 1258  . ALPRAZolam (XANAX) tablet 0.5 mg  0.5 mg Oral TID PRN Georgette Shell, MD   0.5 mg at 09/10/19 0900  . amitriptyline (ELAVIL) tablet 10 mg  10 mg Oral QHS Nita Sells, MD   10 mg at 09/09/19 2218  . ceFEPIme (MAXIPIME) 2 g in sodium chloride 0.9 % 100 mL IVPB  2 g Intravenous BID Nita Sells, MD   Stopped at 09/10/19 1020  . Chlorhexidine Gluconate Cloth 2 % PADS 6 each  6 each Topical Daily Nita Sells, MD   6 each at 09/09/19 2220  . enoxaparin (LOVENOX) injection 30 mg  30 mg Subcutaneous QHS Nita Sells, MD   30 mg at 09/09/19 2220  . guaiFENesin (MUCINEX) 12 hr tablet 600 mg  600 mg Oral BID Nita Sells, MD   600 mg at 09/10/19 0902  . HYDROmorphone (DILAUDID) injection 0.25 mg  0.25 mg Intravenous Q6H PRN Georgette Shell, MD   0.25 mg at 09/10/19 1339  . ipratropium-albuterol (DUONEB) 0.5-2.5 (3) MG/3ML nebulizer solution 3 mL  3 mL Nebulization Once Isla Pence, MD   Stopped at 09/09/19 1605  . ipratropium-albuterol (DUONEB) 0.5-2.5 (3) MG/3ML nebulizer solution 3 mL  3 mL Nebulization TID Nita Sells, MD   3 mL at 09/10/19 0752  .  lipase/protease/amylase (CREON) capsule 24,000 Units  24,000 Units Oral TID WC & HS Nita Sells, MD   24,000 Units at 09/10/19 1256  . MEDLINE mouth rinse  15 mL Mouth Rinse BID Nita Sells, MD   15 mL at 09/10/19 0907  . methylPREDNISolone sodium succinate (SOLU-MEDROL) 125 mg/2 mL injection 60 mg  60 mg Intravenous Q6H Nita Sells, MD   60 mg at 09/10/19 1255   Followed by  . [START ON 09/11/2019] predniSONE (DELTASONE) tablet 40 mg  40 mg Oral Q breakfast Samtani, Jai-Gurmukh, MD      . metroNIDAZOLE (FLAGYL) IVPB 500 mg  500 mg Intravenous Q8H Georgette Shell, MD      . mirtazapine (REMERON) tablet 15 mg  15 mg Oral QHS Nita Sells, MD   15 mg at 09/09/19 2216  . pantoprazole (PROTONIX) injection 40 mg  40 mg Intravenous Q12H Georgette Shell, MD   40 mg at 09/10/19 1339  . pneumococcal 23 valent vaccine (PNEUMOVAX-23) injection 0.5 mL  0.5 mL Intramuscular Prior to discharge Georgette Shell, MD      . traMADol Veatrice Bourbon) tablet 50 mg  50 mg Oral TID PRN Georgette Shell, MD   50 mg at 09/10/19 0901    Allergies as of 09/09/2019 - Review Complete 09/09/2019  Allergen Reaction Noted  . Azithromycin  11/11/2016  . Gabapentin  11/11/2016    Family History  Problem Relation Age of Onset  . Diabetes Sister        x 3  . Cancer Sister 20       brain that metastized to bone, stomach and other organs  . Melanoma Maternal Aunt   . Heart attack Father   . Pneumonia Brother   . Cancer Other 17       cancer in his chest - nephew  . Colon cancer Neg Hx   . Pancreatic cancer Neg Hx   . Rectal cancer Neg Hx   . Esophageal cancer Neg Hx     Social History   Socioeconomic History  . Marital status: Single    Spouse name: Not on file  . Number of children: Not on file  . Years of education: Not on file  . Highest education  level: Not on file  Occupational History  . Not on file  Tobacco Use  . Smoking status: Former Smoker     Packs/day: 0.25    Years: 2.00    Pack years: 0.50    Types: Cigarettes    Quit date: 05/30/2019    Years since quitting: 0.2  . Smokeless tobacco: Never Used  . Tobacco comment: Not ready  Substance and Sexual Activity  . Alcohol use: No  . Drug use: No  . Sexual activity: Never  Other Topics Concern  . Not on file  Social History Narrative  . Not on file   Social Determinants of Health   Financial Resource Strain:   . Difficulty of Paying Living Expenses: Not on file  Food Insecurity:   . Worried About Charity fundraiser in the Last Year: Not on file  . Ran Out of Food in the Last Year: Not on file  Transportation Needs:   . Lack of Transportation (Medical): Not on file  . Lack of Transportation (Non-Medical): Not on file  Physical Activity:   . Days of Exercise per Week: Not on file  . Minutes of Exercise per Session: Not on file  Stress:   . Feeling of Stress : Not on file  Social Connections:   . Frequency of Communication with Friends and Family: Not on file  . Frequency of Social Gatherings with Friends and Family: Not on file  . Attends Religious Services: Not on file  . Active Member of Clubs or Organizations: Not on file  . Attends Archivist Meetings: Not on file  . Marital Status: Not on file  Intimate Partner Violence:   . Fear of Current or Ex-Partner: Not on file  . Emotionally Abused: Not on file  . Physically Abused: Not on file  . Sexually Abused: Not on file    Review of Systems: All systems reviewed and negative except where noted in HPI.  Physical Exam: Vital signs in last 24 hours: Temp:  [97.8 F (36.6 C)-99.1 F (37.3 C)] 99.1 F (37.3 C) (02/26 1236) Pulse Rate:  [60-118] 107 (02/26 1300) Resp:  [10-19] 11 (02/26 1300) BP: (113-169)/(72-104) 155/84 (02/26 1300) SpO2:  [81 %-100 %] 100 % (02/26 1300) Weight:  [36.3 kg-37.7 kg] 37.7 kg (02/25 2300) Last BM Date: (unknown) General:   Alert, thin female in NAD Psych:   Pleasant, cooperative. Normal mood and affect. Eyes:  Pupils equal, sclera clear, no icterus.   Conjunctiva pink. Ears:  Normal auditory acuity. Nose:  No deformity, discharge,  or lesions. Neck:  Supple; no masses Lungs:  Clear throughout to auscultation.   No wheezes, crackles, or rhonchi.  Heart:  Regular rate and rhythm; no murmurs, no lower extremity edema Abdomen:  Soft, non-distended, mild epigastric tenderness, BS active, no palp mass   Rectal:  Deferred  Msk:  Symmetrical without gross deformities. . Neurologic:  Alert and  oriented x4;  grossly normal neurologically. Skin:  Intact without significant lesions or rashes.   Intake/Output from previous day: 02/25 0701 - 02/26 0700 In: 378.7 [P.O.:60; I.V.:218.8; IV Piggyback:99.8] Out: 750 [Urine:750] Intake/Output this shift: Total I/O In: 842.3 [P.O.:120; I.V.:653.7; IV Piggyback:68.6] Out: 450 [Urine:450]  Lab Results: Recent Labs    09/09/19 1637 09/10/19 0215  WBC 31.9* 36.7*  HGB 15.6* 13.8  HCT 49.7* 43.7  PLT 247 253   BMET Recent Labs    09/09/19 1507 09/10/19 0215  NA 142 138  K 5.5* 4.6  CL  92* 98  CO2 34* 29  GLUCOSE 90 155*  BUN 37* 28*  CREATININE 0.94 0.55  CALCIUM 9.7 8.3*   LFT Recent Labs    09/10/19 0215  PROT 6.6  ALBUMIN 3.5  AST 22  ALT 17  ALKPHOS 82  BILITOT 0.4   PT/INR No results for input(s): LABPROT, INR in the last 72 hours. Hepatitis Panel No results for input(s): HEPBSAG, HCVAB, HEPAIGM, HEPBIGM in the last 72 hours.   . CBC Latest Ref Rng & Units 09/10/2019 09/09/2019 06/30/2019  WBC 4.0 - 10.5 K/uL 36.7(H) 31.9(H) 8.4  Hemoglobin 12.0 - 15.0 g/dL 13.8 15.6(H) 13.3  Hematocrit 36.0 - 46.0 % 43.7 49.7(H) 42.3  Platelets 150 - 400 K/uL 253 247 276    . CMP Latest Ref Rng & Units 09/10/2019 09/09/2019 06/30/2019  Glucose 70 - 99 mg/dL 155(H) 90 113(H)  BUN 8 - 23 mg/dL 28(H) 37(H) 19  Creatinine 0.44 - 1.00 mg/dL 0.55 0.94 0.55  Sodium 135 - 145 mmol/L 138 142  139  Potassium 3.5 - 5.1 mmol/L 4.6 5.5(H) 4.2  Chloride 98 - 111 mmol/L 98 92(L) 96(L)  CO2 22 - 32 mmol/L 29 34(H) 33(H)  Calcium 8.9 - 10.3 mg/dL 8.3(L) 9.7 8.5(L)  Total Protein 6.5 - 8.1 g/dL 6.6 - -  Total Bilirubin 0.3 - 1.2 mg/dL 0.4 - -  Alkaline Phos 38 - 126 U/L 82 - -  AST 15 - 41 U/L 22 - -  ALT 0 - 44 U/L 17 - -   Studies/Results: DG Chest 2 View  Result Date: 09/10/2019 CLINICAL DATA:  Shortness of breath EXAM: CHEST - 2 VIEW COMPARISON:  09/09/2019 FINDINGS: The heart size and mediastinal contours are within normal limits. Rounded nodular densities projecting over the bilateral lung fields suggestive of nipple shadows. Lungs are mildly hyperexpanded. Mild left basilar scarring, unchanged. Both lungs are clear. The visualized skeletal structures are unremarkable. Abdominal aortic atherosclerotic calcifications noted. IMPRESSION: No acute cardiopulmonary disease. Mild hyperexpansion of the lungs. Abdominal aortic atherosclerosis. Electronically Signed   By: Davina Poke D.O.   On: 09/10/2019 10:16   CT ABDOMEN PELVIS W CONTRAST  Result Date: 09/09/2019 CLINICAL DATA:  Acute abdominal pain. Neutropenia. COPD. Lung cancer. EXAM: CT ABDOMEN AND PELVIS WITH CONTRAST TECHNIQUE: Multidetector CT imaging of the abdomen and pelvis was performed using the standard protocol following bolus administration of intravenous contrast. CONTRAST:  75mL OMNIPAQUE IOHEXOL 300 MG/ML  SOLN COMPARISON:  CT 12/27/2012 FINDINGS: Lower chest: Lung bases are clear. Hepatobiliary: No focal hepatic lesion. No biliary duct dilatation. Gallbladder is normal. Common bile duct is normal. Pancreas: Pancreas is normal. No ductal dilatation. No pancreatic inflammation. Spleen: Normal spleen Adrenals/urinary tract: Adrenal glands and kidneys are normal. The ureters and bladder normal. Stomach/Bowel: Fluid-filled distal esophagus. There is enhancement of the esophagus and submucosal edema in distal esophagus as well  as inflammation in the adjacent fat along the distal esophagus (image 1 through 9 of series 2 and coronal image 53/5). Stomach, small-bowel normal. Terminal ileum normal. Appendix not identified. Moderate volume stool in the ascending colon proximal transverse colon. Distal transverse and descending colon are collapsed. There is a transition point from the stool-filled RIGHT colon to the collapsed LEFT colon which occurs in the mid transverse colon (image 23/5); however, there is no discrete lesion identified at this level Vascular/Lymphatic: Abdominal aorta is normal caliber with atherosclerotic calcification. There is no retroperitoneal or periportal lymphadenopathy. No pelvic lymphadenopathy. Reproductive: Post hysterectomy Other: No free fluid. Musculoskeletal:  No aggressive osseous lesion. IMPRESSION: 1. Fluid-filled distal esophagus with submucosal edema and inflammation along the distal esophagus. Findings are consistent with esophagitis. 2. Moderate volume stool in the ascending colon and proximal transverse colon with transition point in the mid transverse colon. No obstructing lesion identified; however, recommend colonoscopy to exclude underlying lesion. 3. Atherosclerotic calcification of the aorta. Electronically Signed   By: Suzy Bouchard M.D.   On: 09/09/2019 19:04   DG Chest Port 1 View  Result Date: 09/09/2019 CLINICAL DATA:  Shortness of breath. Reported history of lung carcinoma EXAM: PORTABLE CHEST 1 VIEW COMPARISON:  June 29, 2019 FINDINGS: Postoperative change noted in the left perihilar region. Lungs are somewhat hyperexpanded, stable. No edema or airspace opacity. No adenopathy or mass. Heart size normal. No bone lesions. IMPRESSION: Lungs somewhat hyperexpanded, stable. Postoperative change on the left with clips in the left perihilar region. No edema or airspace opacity. No mass or adenopathy. Heart size normal. Electronically Signed   By: Lowella Grip III M.D.   On:  09/09/2019 15:28    Active Problems:   COPD with acute exacerbation ([place])   Respiratory failure ([place])    Tye Savoy, NP-C @  09/10/2019, 1:44 PM

## 2019-09-10 NOTE — Progress Notes (Signed)
Initial Nutrition Assessment  RD working remotely.   DOCUMENTATION CODES:   Underweight  INTERVENTION:  - diet advancement as medically feasible.  - will monitor for need for nutrition support   NUTRITION DIAGNOSIS:   Inadequate oral intake related to acute illness, nausea, vomiting as evidenced by per patient/family report, NPO status.  GOAL:   Patient will meet greater than or equal to 90% of their needs  MONITOR:   Diet advancement, Labs, Weight trends, I & O's  REASON FOR ASSESSMENT:   Malnutrition Screening Tool, Consult Assessment of nutrition requirement/status  ASSESSMENT:   64 year old female with history of esophageal stricture and dilatation 01/2018, gastrinoma s/p Whipple, lobectomy of LUL, and depression. She was admitted with intractable N/V.  History mainly obtained from the patient's daughter who reported patient has been unable to eat or drink anything due to persistent vomiting and severe abdominal pain.  Patient has been NPO since admission. She is out of the room to Diagnostic Radiology. Weight yesterday was 83 lb and weight on 06/29/19 was 87 lb. This indicates 4 lb wtloss (4.6% body wt) in 2 month; not significant for time frame but unable to determine if weight loss occurred more acutely.   Patient was assessed remotely by a RD on 06/30/19 at which time patient reported that she drank Ensure supplements at home and Ensure was ordered QID. It was noted at that time that UBW was 87-90 lb and patient had been at this weight x1 year.   Suspect some degree of malnutrition, but unable to confirm at this time.   Per notes: - esophageal stricture with suspected esophagitis - moderate amount of stool in the ascending colon and transverse colon--no obstruction - COPD exacerbation - leukocytosis   Labs reviewed; BUN: 28 mg/dl, Ca: 8.3 mg/dl. Medications reviewed; 24000 units creon TID, 2 g IV Mg sulfate x1 run 2/25, 125 mg solu-medrol x1 dose 2/25, 60 mg  solu-medrol x4 doses 2/25, 40 mg deltasone/day starting 2/27, 40 mg IV protonix BID.  IVF; NS @ 100 ml/hr.     NUTRITION - FOCUSED PHYSICAL EXAM:  unable to complete at this time.   Diet Order:   Diet Order            Diet NPO time specified  Diet effective now              EDUCATION NEEDS:   No education needs have been identified at this time  Skin:  Skin Assessment: Reviewed RN Assessment  Last BM:  PTA/unknown  Height:   Ht Readings from Last 1 Encounters:  09/09/19 4\' 11"  (1.499 m)    Weight:   Wt Readings from Last 1 Encounters:  09/09/19 37.7 kg    Ideal Body Weight:  42.9 kg  BMI:  Body mass index is 16.79 kg/m.  Estimated Nutritional Needs:   Kcal:  1510-1810 kcal  Protein:  70-85 grams  Fluid:  >/= 1.8 L/day     Jarome Matin, MS, RD, LDN, CNSC Inpatient Clinical Dietitian RD pager # available in [facility]  After hours/weekend pager # available in [facility]

## 2019-09-10 NOTE — Progress Notes (Signed)
PROGRESS NOTE    Claudia Wiley  [id number] DOB: 02/17/1956 DOA: 09/09/2019 PCP: System, Pcp Not In  Brief Narrative: 64 year old black female with history of esophageal stricture and dilatation 01/2018, gastrinoma status post Whipple's procedure, depression admitted with intractable nausea and vomiting.  History mainly obtained from the patient's daughter.  Per daughter she has not been able to eat anything or drink anything she continues to vomit. She also has severe abdominal pain denies diarrhea. Family moved here in 2018 from [place] where she had surgery on her stomach for gastrinoma which was removed.  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  09/10/2019 patient very emotional tearful crying complaining of abdominal pain She has not had anything to eat or drink today. At the time of admission ABG 7.1 8/104/31 she was put on BiPAP Repeat ABG 7.2 8/71/73 Significant labs white count 31.9 at the time of admission up to 36.7 with a hemoglobin of 13.8 platelet count of 253, chest x-ray no acute disease mild hyperexpansion of the lungs.  Assessment & Plan:   Active Problems:   Respiratory failure (HCC)  #1 esophageal stricture likely recurrence with esophagitis-CT abdomen and pelvis shows-fluid-filled distal esophagus with submucosal edema and inflammation along the distal esophagus consistent with esophagitis.  Moderate volume stool in the ascending colon and proximal transverse colon.  No obstruction identified. Will keep her n.p.o. on IV fluids Consulted gi appreciate their input Protonix IV  #2 acute hypoxic and hypercarbic respiratory failure secondary to COPD exacerbation.  Chest x-ray clear other than findings of COPD.  Continue Solu-Medrol taper as patient can tolerate.  Continue duo nebs.  Continue Spiriva.  #3 anxiety with depression Xanax and Elavil and Remeron  #4 chronic pain continue tramadol  #5  leukocytosis patient had leukocytosis at the time of admission even before getting the steroids.  UA is negative chest x-ray is negative she is on cefepime for COPD exacerbation continue for now.  Added Flagyl to cover any intra-abdominal etiology.  #6 history of essential hypertension on Norvasc at home we will place her on hydralazine IV as needed while in hospital.  Estimated body mass index is 16.79 kg/m as calculated from the following:   Height as of this encounter: 4\' 11"  (1.499 m).   Weight as of this encounter: 37.7 kg.  DVT prophylaxis: Lovenox  code Status: Full code Family Communication: Discussed with patient's daughter  disposition Plan: She is admitted from home plan may be returned back to home once medical issues have been resolved.  She is admitted with severe abdominal pain and intractable nausea and vomiting and has history of esophageal stricture and esophagitis. Consultants:   [facility]  Procedures: None Antimicrobials: Maxipime and Flagyl  Subjective: Patient is resting in bed anxious tearful denies nausea no appetite complains of abdominal pain abdomen is very tender to mild touch  Objective: Vitals:   09/10/19 0900 09/10/19 1100 09/10/19 1200 09/10/19 1236  BP: (!) 164/78 (!) 160/89 (!) 148/75   Pulse: (!) 106 (!) 104 (!) 106   Resp: 11 14 10    Temp:    99.1 F (37.3 C)  TempSrc:    Oral  SpO2: 98% 100% 100%   Weight:      Height:        Intake/Output Summary (Last 24 hours) at 09/10/2019 1246 Last data filed at 09/10/2019 1020 Gross per 24 hour  Intake 957.64 ml  Output 1200 ml  Net -242.36 ml   Filed Weights   09/09/19 1513 09/09/19 2300  Weight: 36.3 kg 37.7 kg    Examination:  General exam: Appears calm and comfortable  Respiratory system: Clear to auscultation. Respiratory effort normal. Cardiovascular system: S1 & S2 heard, RRR. No JVD, murmurs, rubs, gallops or clicks. No pedal edema. Gastrointestinal system: Abdomen is  nondistended, soft and nontender. No organomegaly or masses felt. Normal bowel sounds heard. Central nervous system: Alert and oriented. No focal neurological deficits. Extremities: Symmetric 5 x 5 power. Skin: No rashes, lesions or ulcers Psychiatry: Judgement and insight appear normal. Mood & affect appropriate.     Data Reviewed: I have personally reviewed following labs and imaging studies  CBC: Recent Labs  Lab 09/09/19 1637 09/10/19 0215  WBC 31.9* 36.7*  NEUTROABS 29.2*  --   HGB 15.6* 13.8  HCT 49.7* 43.7  MCV 91.7 91.4  PLT [id number]   Basic Metabolic Panel: Recent Labs  Lab 09/09/19 1507 09/10/19 0215  NA 142 138  K 5.5* 4.6  CL 92* 98  CO2 34* 29  GLUCOSE 90 155*  BUN 37* 28*  CREATININE 0.94 0.55  CALCIUM 9.7 8.3*   GFR: Estimated Creatinine Clearance: 44 mL/min (by C-G formula based on SCr of 0.55 mg/dL). Liver Function Tests: Recent Labs  Lab 09/10/19 0215  AST 22  ALT 17  ALKPHOS 82  BILITOT 0.4  PROT 6.6  ALBUMIN 3.5   No results for input(s): LIPASE, AMYLASE in the last 168 hours. No results for input(s): AMMONIA in the last 168 hours. Coagulation Profile: No results for input(s): INR, PROTIME in the last 168 hours. Cardiac Enzymes: No results for input(s): CKTOTAL, CKMB, CKMBINDEX, TROPONINI in the last 168 hours. BNP (last 3 results) No results for input(s): PROBNP in the last 8760 hours. HbA1C: No results for input(s): HGBA1C in the last 72 hours. CBG: No results for input(s): GLUCAP in the last 168 hours. Lipid Profile: No results for input(s): CHOL, HDL, LDLCALC, TRIG, CHOLHDL, LDLDIRECT in the last 72 hours. Thyroid Function Tests: No results for input(s): TSH, T4TOTAL, FREET4, T3FREE, THYROIDAB in the last 72 hours. Anemia Panel: No results for input(s): VITAMINB12, FOLATE, FERRITIN, TIBC, IRON, RETICCTPCT in the last 72 hours. Sepsis Labs: No results for input(s): PROCALCITON, LATICACIDVEN in the last 168 hours.  Recent  Results (from the past 240 hour(s))  Respiratory Panel by RT PCR (Flu A&B, Covid) - Nasopharyngeal Swab     Status: None   Collection Time: 09/09/19  3:08 PM   Specimen: Nasopharyngeal Swab  Result Value Ref Range Status   SARS Coronavirus 2 by RT PCR NEGATIVE NEGATIVE Final    Comment: (NOTE) SARS-CoV-2 target nucleic acids are NOT DETECTED. The SARS-CoV-2 RNA is generally detectable in upper respiratoy specimens during the acute phase of infection. The lowest concentration of SARS-CoV-2 viral copies this assay can detect is 131 copies/mL. A negative result does not preclude SARS-Cov-2 infection and should not be used as the sole basis for treatment or other patient management decisions. A negative result may occur with  improper specimen collection/handling, submission of specimen other than nasopharyngeal swab, presence of viral mutation(s) within the areas targeted by this assay, and inadequate number of viral copies (<131 copies/mL). A negative result must be combined with clinical observations, patient history, and epidemiological information. The expected result is Negative. Fact Sheet for Patients:  PinkCheek.be Fact Sheet for Healthcare Providers:  GravelBags.it This test is not yet ap proved or cleared by the [place]  States FDA and  has been authorized for detection and/or diagnosis of SARS-CoV-2 by FDA under an Emergency Use Authorization (EUA). This EUA will remain  in effect (meaning this test can be used) for the duration of the COVID-19 declaration under Section 564(b)(1) of the Act, 21 U.S.C. section 360bbb-3(b)(1), unless the authorization is terminated or revoked sooner.    Influenza A by PCR NEGATIVE NEGATIVE Final   Influenza B by PCR NEGATIVE NEGATIVE Final    Comment: (NOTE) The Xpert Xpress SARS-CoV-2/FLU/RSV assay is intended as an aid in  the diagnosis of influenza from Nasopharyngeal swab specimens  and  should not be used as a sole basis for treatment. Nasal washings and  aspirates are unacceptable for Xpert Xpress SARS-CoV-2/FLU/RSV  testing. Fact Sheet for Patients: PinkCheek.be Fact Sheet for Healthcare Providers: GravelBags.it This test is not yet approved or cleared by the [place] FDA and  has been authorized for detection and/or diagnosis of SARS-CoV-2 by  FDA under an Emergency Use Authorization (EUA). This EUA will remain  in effect (meaning this test can be used) for the duration of the  Covid-19 declaration under Section 564(b)(1) of the Act, 21  U.S.C. section 360bbb-3(b)(1), unless the authorization is  terminated or revoked. Performed at [facility], [place] [address] [place], [place] [address]   MRSA PCR Screening     Status: None   Collection Time: 09/09/19  9:22 PM   Specimen: Nasopharyngeal  Result Value Ref Range Status   MRSA by PCR NEGATIVE NEGATIVE Final    Comment:        The GeneXpert MRSA Assay (FDA approved for NASAL specimens only), is one component of a comprehensive MRSA colonization surveillance program. It is not intended to diagnose MRSA infection nor to guide or monitor treatment for MRSA infections. Performed at [facility], [place] [address] [place], [place] [address]          Radiology Studies: DG Chest 2 View  Result Date: 09/10/2019 CLINICAL DATA:  Shortness of breath EXAM: CHEST - 2 VIEW COMPARISON:  09/09/2019 FINDINGS: The heart size and mediastinal contours are within normal limits. Rounded nodular densities projecting over the bilateral lung fields suggestive of nipple shadows. Lungs are mildly hyperexpanded. Mild left basilar scarring, unchanged. Both lungs are clear. The visualized skeletal structures are unremarkable. Abdominal aortic atherosclerotic calcifications noted. IMPRESSION: No acute cardiopulmonary disease. Mild  hyperexpansion of the lungs. Abdominal aortic atherosclerosis. Electronically Signed   By: Davina Poke D.O.   On: 09/10/2019 10:16   CT ABDOMEN PELVIS W CONTRAST  Result Date: 09/09/2019 CLINICAL DATA:  Acute abdominal pain. Neutropenia. COPD. Lung cancer. EXAM: CT ABDOMEN AND PELVIS WITH CONTRAST TECHNIQUE: Multidetector CT imaging of the abdomen and pelvis was performed using the standard protocol following bolus administration of intravenous contrast. CONTRAST:  101mL OMNIPAQUE IOHEXOL 300 MG/ML  SOLN COMPARISON:  CT 12/27/2012 FINDINGS: Lower chest: Lung bases are clear. Hepatobiliary: No focal hepatic lesion. No biliary duct dilatation. Gallbladder is normal. Common bile duct is normal. Pancreas: Pancreas is normal. No ductal dilatation. No pancreatic inflammation. Spleen: Normal spleen Adrenals/urinary tract: Adrenal glands and kidneys are normal. The ureters and bladder normal. Stomach/Bowel: Fluid-filled distal esophagus. There is enhancement of the esophagus and submucosal edema in distal esophagus as well as inflammation in the adjacent fat along the distal esophagus (image 1 through 9 of series 2 and coronal image 53/5). Stomach, small-bowel normal. Terminal ileum normal. Appendix not identified. Moderate volume stool in the ascending colon  proximal transverse colon. Distal transverse and descending colon are collapsed. There is a transition point from the stool-filled RIGHT colon to the collapsed LEFT colon which occurs in the mid transverse colon (image 23/5); however, there is no discrete lesion identified at this level Vascular/Lymphatic: Abdominal aorta is normal caliber with atherosclerotic calcification. There is no retroperitoneal or periportal lymphadenopathy. No pelvic lymphadenopathy. Reproductive: Post hysterectomy Other: No free fluid. Musculoskeletal: No aggressive osseous lesion. IMPRESSION: 1. Fluid-filled distal esophagus with submucosal edema and inflammation along the distal  esophagus. Findings are consistent with esophagitis. 2. Moderate volume stool in the ascending colon and proximal transverse colon with transition point in the mid transverse colon. No obstructing lesion identified; however, recommend colonoscopy to exclude underlying lesion. 3. Atherosclerotic calcification of the aorta. Electronically Signed   By: Suzy Bouchard M.D.   On: 09/09/2019 19:04   DG Chest Port 1 View  Result Date: 09/09/2019 CLINICAL DATA:  Shortness of breath. Reported history of lung carcinoma EXAM: PORTABLE CHEST 1 VIEW COMPARISON:  June 29, 2019 FINDINGS: Postoperative change noted in the left perihilar region. Lungs are somewhat hyperexpanded, stable. No edema or airspace opacity. No adenopathy or mass. Heart size normal. No bone lesions. IMPRESSION: Lungs somewhat hyperexpanded, stable. Postoperative change on the left with clips in the left perihilar region. No edema or airspace opacity. No mass or adenopathy. Heart size normal. Electronically Signed   By: Lowella Grip III M.D.   On: 09/09/2019 15:28        Scheduled Meds: . amitriptyline  10 mg Oral QHS  . Chlorhexidine Gluconate Cloth  6 each Topical Daily  . enoxaparin (LOVENOX) injection  30 mg Subcutaneous QHS  . guaiFENesin  600 mg Oral BID  . ipratropium-albuterol  3 mL Nebulization Once  . ipratropium-albuterol  3 mL Nebulization TID  . lipase/protease/amylase  24,000 Units Oral TID WC & HS  . mouth rinse  15 mL Mouth Rinse BID  . methylPREDNISolone (SOLU-MEDROL) injection  60 mg Intravenous Q6H   Followed by  . [START ON 09/11/2019] predniSONE  40 mg Oral Q breakfast  . mirtazapine  15 mg Oral QHS   Continuous Infusions: . sodium chloride 100 mL/hr at 09/10/19 0730  . ceFEPime (MAXIPIME) IV Stopped (09/10/19 1020)     LOS: 1 day     Georgette Shell, MD  09/10/2019, 12:46 PM

## 2019-09-10 NOTE — Progress Notes (Signed)
OT Cancellation Note  Patient Details Name: Claudia Wiley MRN: [id number] DOB: [birth year]   Cancelled Treatment:    Reason Eval/Treat Not Completed: (P) Pain limiting ability to participate(Will check back later if able.)  [facility] 09/10/2019, 11:02 AM  Maurie Boettcher, OT/L   Acute OT Clinical Specialist Acute Rehabilitation Services Pager [phone] Office [phone]

## 2019-09-10 NOTE — Progress Notes (Signed)
[place] Progress Note Patient Name: Claudia Wiley DOB: Aug 25, 1955 MRN: [id number]   Date of Service  09/10/2019  HPI/Events of Note  Follow up assessment.  eICU Interventions  Pt noted stable when I  Assessed her via camera, [facility] hospitalist NP note noted.        Kerry Kass [place] 09/10/2019, 12:03 AM

## 2019-09-10 NOTE — Progress Notes (Signed)
PT Cancellation Note  Patient Details Name: Claudia Wiley MRN: [id number] DOB: Apr 21, 1956   Cancelled Treatment:    Reason Eval/Treat Not Completed: Medical issues which prohibited therapy;Pain limiting ability to participate  Will check back tomorrow. [name] 09/10/2019, 10:54 AM [place] Pager [phone] Office [phone]

## 2019-09-11 ENCOUNTER — Encounter (HOSPITAL_COMMUNITY): Payer: Self-pay | Admitting: Family Medicine

## 2019-09-11 ENCOUNTER — Encounter (HOSPITAL_COMMUNITY)
Admission: EM | Disposition: A | Discharge status: Home Health | Payer: Self-pay | Source: Home / Self Care | Attending: Internal Medicine

## 2019-09-11 ENCOUNTER — Inpatient Hospital Stay (HOSPITAL_COMMUNITY): Payer: No Typology Code available for payment source | Admitting: Anesthesiology

## 2019-09-11 HISTORY — PX: BIOPSY: SHX5522

## 2019-09-11 HISTORY — PX: ESOPHAGOGASTRODUODENOSCOPY (EGD) WITH PROPOFOL: SHX5813

## 2019-09-11 SURGERY — ESOPHAGOGASTRODUODENOSCOPY (EGD) WITH PROPOFOL
Anesthesia: Monitor Anesthesia Care

## 2019-09-11 MED ORDER — FLUTICASONE PROPIONATE 50 MCG/ACT NA SUSP
2.0000 | Freq: Every day | NASAL | Status: DC
  Administered 2019-09-11 – 2019-09-19 (×9): 2 via NASAL
  Filled 2019-09-11: qty 16

## 2019-09-11 MED ORDER — PROPOFOL 10 MG/ML IV BOLUS
INTRAVENOUS | Status: DC | PRN
  Administered 2019-09-11 (×2): 10 mg via INTRAVENOUS

## 2019-09-11 MED ORDER — PROPOFOL 500 MG/50ML IV EMUL
INTRAVENOUS | Status: DC | PRN
  Administered 2019-09-11: 60 ug/kg/min via INTRAVENOUS

## 2019-09-11 MED ORDER — SUCRALFATE 1 GM/10ML PO SUSP
1.0000 g | Freq: Four times a day (QID) | ORAL | Status: DC
  Administered 2019-09-11 – 2019-09-19 (×33): 1 g via ORAL
  Filled 2019-09-11 (×33): qty 10

## 2019-09-11 MED ORDER — DIPHENHYDRAMINE HCL 50 MG/ML IJ SOLN
25.0000 mg | Freq: Once | INTRAMUSCULAR | Status: AC
  Administered 2019-09-11: 13:00:00 25 mg via INTRAVENOUS
  Filled 2019-09-11: qty 1

## 2019-09-11 MED ORDER — ALBUTEROL SULFATE (2.5 MG/3ML) 0.083% IN NEBU
INHALATION_SOLUTION | RESPIRATORY_TRACT | Status: AC
  Filled 2019-09-11: qty 3

## 2019-09-11 MED ORDER — PROPOFOL 500 MG/50ML IV EMUL
INTRAVENOUS | Status: AC
  Filled 2019-09-11: qty 50

## 2019-09-11 SURGICAL SUPPLY — 15 items

## 2019-09-11 NOTE — Anesthesia Preprocedure Evaluation (Signed)
Anesthesia Evaluation  Patient identified by MRN, date of birth, ID band Patient awake    Reviewed: Allergy & Precautions, NPO status , Patient's Chart, lab work & pertinent test results  Airway Mallampati: II  TM Distance: >3 FB Neck ROM: Full    Dental  (+) Teeth Intact, Dental Advisory Given   Pulmonary COPD,  COPD inhaler and oxygen dependent, former smoker,    Pulmonary exam normal breath sounds clear to auscultation       Cardiovascular hypertension, Pt. on medications Normal cardiovascular exam Rhythm:Regular Rate:Normal     Neuro/Psych negative neurological ROS     GI/Hepatic Neg liver ROS, GERD  Medicated,epigastric pain, abnormal esophagus on CT scan   Endo/Other  negative endocrine ROS  Renal/GU negative Renal ROS     Musculoskeletal negative musculoskeletal ROS (+)   Abdominal   Peds  Hematology negative hematology ROS (+)   Anesthesia Other Findings Day of surgery medications reviewed with the patient.  Reproductive/Obstetrics                             Anesthesia Physical Anesthesia Plan  ASA: III  Anesthesia Plan: MAC   Post-op Pain Management:    Induction: Intravenous  PONV Risk Score and Plan: 2 and Propofol infusion  Airway Management Planned: Natural Airway and Nasal Cannula  Additional Equipment:   Intra-op Plan:   Post-operative Plan: Extubation in OR  Informed Consent: I have reviewed the patients History and Physical, chart, labs and discussed the procedure including the risks, benefits and alternatives for the proposed anesthesia with the patient or authorized representative who has indicated his/her understanding and acceptance.     Dental advisory given  Plan Discussed with: CRNA  Anesthesia Plan Comments: (Risks/benefits of general anesthesia discussed with patient including risk of damage to teeth, lips, gum, and tongue, nausea/vomiting,  allergic reactions to medications, and the possibility of heart attack, stroke and death.  All patient questions answered.  Patient wishes to proceed.)        Anesthesia Quick Evaluation

## 2019-09-11 NOTE — Progress Notes (Signed)
PROGRESS NOTE    Claudia Wiley  [id number] DOB: Mar 09, 1956 DOA: 09/09/2019 PCP: System, Pcp Not In    Brief Narrative:64 year old black female with history of esophageal stricture and dilatation 01/2018, gastrinoma status post Whipple's procedure, depression admitted with intractable nausea and vomiting.  History mainly obtained from the patient's daughter.  Per daughter she has not been able to eat anything or drink anything she continues to vomit. She also has severe abdominal pain denies diarrhea. Family moved here in 2018 from [place] where she had surgery on her stomach for gastrinoma which was removed.  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  09/10/2019 patient very emotional tearful crying complaining of abdominal pain She has not had anything to eat or drink today. At the time of admission ABG 7.1 8/104/31 she was put on BiPAP Repeat ABG 7.2 8/71/73 Significant labs white count 31.9 at the time of admission up to 36.7 with a hemoglobin of 13.8 platelet count of 253, chest x-ray no acute disease mild hyperexpansion of the lungs.  09/11/2019 seen patient in the room after EGD daughter present in the room answered all the questions patient continues to have sneezing after the procedure.  Assessment & Plan:   Active Problems:   COPD with acute exacerbation (HCC)   Respiratory failure (HCC)   Acute esophagitis   Esophageal dysphagia   Esophageal stenosis   #1 esophageal stricture likely recurrence with esophagitis-CT abdomen and pelvis shows-fluid-filled distal esophagus with submucosal edema and inflammation along the distal esophagus consistent with esophagitis.  Moderate volume stool in the ascending colon and proximal transverse colon.  No obstruction identified. egd 09/11/2019-profound mucosal changes in the entire esophagus black appearing mucosa suspicious for acute esophageal necrosis.  Biopsies done.   Nonbleeding duodenal ulcers with no stigmata of bleeding biopsied.  Patient to remain n.p.o. today small sips of water with meds and Protonix 40 mg twice a day with sucralfate suspension 1 g 4 times daily.  Appreciate GI input.  On cefepime and Flagyl continue.  #2 acute hypoxic and hypercarbic respiratory failure secondary to COPD exacerbation.  Chest x-ray clear other than findings of COPD.  Continue Solu-Medrol taper as patient can tolerate.  Continue duo nebs.  Continue Spiriva.  #3 anxiety with depression Xanax and Elavil and Remeron  #4 chronic pain continue tramadol  #5 leukocytosis patient had leukocytosis at the time of admission even before getting the steroids.  UA is negative chest x-ray is negative she is on cefepime for COPD exacerbation continue for now.  Added Flagyl to cover any intra-abdominal etiology.  #6 history of essential hypertension 112/73.  On Norvasc at home we will place her on hydralazine IV as needed while in hospital.     Nutrition Problem: Inadequate oral intake Etiology: acute illness, nausea, vomiting     Signs/Symptoms: per patient/family report, NPO status    Interventions: Refer to RD note for recommendations  Estimated body mass index is 16.79 kg/m as calculated from the following:   Height as of this encounter: 4\' 11"  (1.499 m).   Weight as of this encounter: 37.7 kg.  DVT prophylaxis: Lovenox  code Status: Full code Family Communication: Discussed with patient's daughter  disposition Plan: She is admitted from home plan may be returned back to home once medical issues have been resolved.  She is admitted with severe abdominal pain and intractable nausea and vomiting and has history of esophageal stricture and esophagitis.  Consultants:   [facility]  Procedures: None Antimicrobials: Maxipime and Flagyl   Subjective:  Patient resting in bed daughter by the bedside complains of sneezing since the  procedure  Objective: Vitals:   09/11/19 0759 09/11/19 0812 09/11/19 0900 09/11/19 1332  BP: 136/66 125/76 112/73   Pulse: (!) 106 (!) 105 96   Resp: (!) 21 20 19    Temp: 98.7 F (37.1 C)   98.2 F (36.8 C)  TempSrc: Oral   Oral  SpO2: 100% 99% 96%   Weight:      Height:        Intake/Output Summary (Last 24 hours) at 09/11/2019 1400 Last data filed at 09/11/2019 0757 Gross per 24 hour  Intake 1244.37 ml  Output 500 ml  Net 744.37 ml   Filed Weights   09/09/19 1513 09/09/19 2300 09/11/19 0658  Weight: 36.3 kg 37.7 kg 37.7 kg    Examination:  General exam: Appears calm and comfortable  Respiratory system: Clear to auscultation. Respiratory effort normal. Cardiovascular system: S1 & S2 heard, RRR. No JVD, murmurs, rubs, gallops or clicks. No pedal edema. Gastrointestinal system: Abdomen is nondistended, soft and tender. No organomegaly or masses felt. Normal bowel sounds heard. Central nervous system: Alert and oriented. No focal neurological deficits. Extremities: Symmetric 5 x 5 power. Skin: No rashes, lesions or ulcers Psychiatry: Judgement and insight appear normal. Mood & affect appropriate.     Data Reviewed: I have personally reviewed following labs and imaging studies  CBC: Recent Labs  Lab 09/09/19 1637 09/10/19 0215  WBC 31.9* 36.7*  NEUTROABS 29.2*  --   HGB 15.6* 13.8  HCT 49.7* 43.7  MCV 91.7 91.4  PLT [id number]   Basic Metabolic Panel: Recent Labs  Lab 09/09/19 1507 09/10/19 0215  NA 142 138  K 5.5* 4.6  CL 92* 98  CO2 34* 29  GLUCOSE 90 155*  BUN 37* 28*  CREATININE 0.94 0.55  CALCIUM 9.7 8.3*   GFR: Estimated Creatinine Clearance: 44 mL/min (by C-G formula based on SCr of 0.55 mg/dL). Liver Function Tests: Recent Labs  Lab 09/10/19 0215  AST 22  ALT 17  ALKPHOS 82  BILITOT 0.4  PROT 6.6  ALBUMIN 3.5   No results for input(s): LIPASE, AMYLASE in the last 168 hours. No results for input(s): AMMONIA in the last 168  hours. Coagulation Profile: No results for input(s): INR, PROTIME in the last 168 hours. Cardiac Enzymes: No results for input(s): CKTOTAL, CKMB, CKMBINDEX, TROPONINI in the last 168 hours. BNP (last 3 results) No results for input(s): PROBNP in the last 8760 hours. HbA1C: No results for input(s): HGBA1C in the last 72 hours. CBG: No results for input(s): GLUCAP in the last 168 hours. Lipid Profile: No results for input(s): CHOL, HDL, LDLCALC, TRIG, CHOLHDL, LDLDIRECT in the last 72 hours. Thyroid Function Tests: No results for input(s): TSH, T4TOTAL, FREET4, T3FREE, THYROIDAB in the last 72 hours. Anemia Panel: No results for input(s): VITAMINB12, FOLATE, FERRITIN, TIBC, IRON, RETICCTPCT in the last 72 hours. Sepsis Labs: No results for input(s): PROCALCITON, LATICACIDVEN in the last 168 hours.  Recent Results (from the past 240 hour(s))  Respiratory Panel by RT PCR (Flu A&B, Covid) - Nasopharyngeal Swab     Status: None   Collection Time: 09/09/19  3:08 PM   Specimen: Nasopharyngeal Swab  Result Value Ref Range Status   SARS Coronavirus 2 by RT PCR NEGATIVE NEGATIVE Final    Comment: (NOTE) SARS-CoV-2 target nucleic acids are NOT DETECTED.  The SARS-CoV-2 RNA is generally detectable in upper respiratoy specimens during the acute phase of infection. The lowest concentration of SARS-CoV-2 viral copies this assay can detect is 131 copies/mL. A negative result does not preclude SARS-Cov-2 infection and should not be used as the sole basis for treatment or other patient management decisions. A negative result may occur with  improper specimen collection/handling, submission of specimen other than nasopharyngeal swab, presence of viral mutation(s) within the areas targeted by this assay, and inadequate number of viral copies (<131 copies/mL). A negative result must be combined with clinical observations, patient history, and epidemiological information. The expected result is  Negative. Fact Sheet for Patients:  PinkCheek.be Fact Sheet for Healthcare Providers:  GravelBags.it This test is not yet ap proved or cleared by the [place] FDA and  has been authorized for detection and/or diagnosis of SARS-CoV-2 by FDA under an Emergency Use Authorization (EUA). This EUA will remain  in effect (meaning this test can be used) for the duration of the COVID-19 declaration under Section 564(b)(1) of the Act, 21 U.S.C. section 360bbb-3(b)(1), unless the authorization is terminated or revoked sooner.    Influenza A by PCR NEGATIVE NEGATIVE Final   Influenza B by PCR NEGATIVE NEGATIVE Final    Comment: (NOTE) The Xpert Xpress SARS-CoV-2/FLU/RSV assay is intended as an aid in  the diagnosis of influenza from Nasopharyngeal swab specimens and  should not be used as a sole basis for treatment. Nasal washings and  aspirates are unacceptable for Xpert Xpress SARS-CoV-2/FLU/RSV  testing. Fact Sheet for Patients: PinkCheek.be Fact Sheet for Healthcare Providers: GravelBags.it This test is not yet approved or cleared by the [place] FDA and  has been authorized for detection and/or diagnosis of SARS-CoV-2 by  FDA under an Emergency Use Authorization (EUA). This EUA will remain  in effect (meaning this test can be used) for the duration of the  Covid-19 declaration under Section 564(b)(1) of the Act, 21  U.S.C. section 360bbb-3(b)(1), unless the authorization is  terminated or revoked. Performed at [facility], [place] [address], [place], [place] [address]   MRSA PCR Screening     Status: None   Collection Time: 09/09/19  9:22 PM   Specimen: Nasopharyngeal  Result Value Ref Range Status   MRSA by PCR NEGATIVE NEGATIVE Final    Comment:        The GeneXpert MRSA Assay (FDA approved for NASAL specimens only), is one component of  a comprehensive MRSA colonization surveillance program. It is not intended to diagnose MRSA infection nor to guide or monitor treatment for MRSA infections. Performed at [facility], [place] [address], [place], [place] [address]          Radiology Studies: DG Chest 2 View  Result Date: 09/10/2019 CLINICAL DATA:  Shortness of breath EXAM: CHEST - 2 VIEW COMPARISON:  09/09/2019 FINDINGS: The heart size and mediastinal contours are within normal limits. Rounded nodular densities projecting over the bilateral lung fields suggestive of nipple shadows. Lungs are mildly hyperexpanded. Mild left basilar scarring, unchanged. Both lungs are clear. The visualized skeletal structures are unremarkable. Abdominal aortic atherosclerotic calcifications noted. IMPRESSION: No acute cardiopulmonary disease. Mild hyperexpansion of the lungs. Abdominal aortic atherosclerosis. Electronically Signed   By: Davina Poke D.O.   On: 09/10/2019 10:16   CT ABDOMEN PELVIS W CONTRAST  Result Date: 09/09/2019 CLINICAL DATA:  Acute abdominal pain. Neutropenia. COPD. Lung cancer. EXAM: CT ABDOMEN AND PELVIS WITH CONTRAST TECHNIQUE: Multidetector CT imaging of  the abdomen and pelvis was performed using the standard protocol following bolus administration of intravenous contrast. CONTRAST:  35mL OMNIPAQUE IOHEXOL 300 MG/ML  SOLN COMPARISON:  CT 12/27/2012 FINDINGS: Lower chest: Lung bases are clear. Hepatobiliary: No focal hepatic lesion. No biliary duct dilatation. Gallbladder is normal. Common bile duct is normal. Pancreas: Pancreas is normal. No ductal dilatation. No pancreatic inflammation. Spleen: Normal spleen Adrenals/urinary tract: Adrenal glands and kidneys are normal. The ureters and bladder normal. Stomach/Bowel: Fluid-filled distal esophagus. There is enhancement of the esophagus and submucosal edema in distal esophagus as well as inflammation in the adjacent fat along the distal esophagus (image  1 through 9 of series 2 and coronal image 53/5). Stomach, small-bowel normal. Terminal ileum normal. Appendix not identified. Moderate volume stool in the ascending colon proximal transverse colon. Distal transverse and descending colon are collapsed. There is a transition point from the stool-filled RIGHT colon to the collapsed LEFT colon which occurs in the mid transverse colon (image 23/5); however, there is no discrete lesion identified at this level Vascular/Lymphatic: Abdominal aorta is normal caliber with atherosclerotic calcification. There is no retroperitoneal or periportal lymphadenopathy. No pelvic lymphadenopathy. Reproductive: Post hysterectomy Other: No free fluid. Musculoskeletal: No aggressive osseous lesion. IMPRESSION: 1. Fluid-filled distal esophagus with submucosal edema and inflammation along the distal esophagus. Findings are consistent with esophagitis. 2. Moderate volume stool in the ascending colon and proximal transverse colon with transition point in the mid transverse colon. No obstructing lesion identified; however, recommend colonoscopy to exclude underlying lesion. 3. Atherosclerotic calcification of the aorta. Electronically Signed   By: Suzy Bouchard M.D.   On: 09/09/2019 19:04   DG Chest Port 1 View  Result Date: 09/09/2019 CLINICAL DATA:  Shortness of breath. Reported history of lung carcinoma EXAM: PORTABLE CHEST 1 VIEW COMPARISON:  June 29, 2019 FINDINGS: Postoperative change noted in the left perihilar region. Lungs are somewhat hyperexpanded, stable. No edema or airspace opacity. No adenopathy or mass. Heart size normal. No bone lesions. IMPRESSION: Lungs somewhat hyperexpanded, stable. Postoperative change on the left with clips in the left perihilar region. No edema or airspace opacity. No mass or adenopathy. Heart size normal. Electronically Signed   By: Lowella Grip III M.D.   On: 09/09/2019 15:28        Scheduled Meds: . amitriptyline  10 mg Oral  QHS  . Chlorhexidine Gluconate Cloth  6 each Topical Daily  . fluticasone  2 spray Each Nare Daily  . guaiFENesin  600 mg Oral BID  . ipratropium-albuterol  3 mL Nebulization Once  . ipratropium-albuterol  3 mL Nebulization TID  . lipase/protease/amylase  24,000 Units Oral TID WC & HS  . mouth rinse  15 mL Mouth Rinse BID  . mirtazapine  15 mg Oral QHS  . pantoprazole (PROTONIX) IV  40 mg Intravenous Q12H  . predniSONE  40 mg Oral Q breakfast  . sucralfate  1 g Oral Q6H   Continuous Infusions: . sodium chloride 100 mL/hr at 09/11/19 0934  . ceFEPime (MAXIPIME) IV Stopped (09/11/19 1022)  . metronidazole Stopped (09/11/19 0709)     LOS: 2 days     Georgette Shell, MD 09/11/2019, 2:00 PM

## 2019-09-11 NOTE — Progress Notes (Signed)
OT Cancellation Note  Patient Details Name: Claudia Wiley MRN: [id number] DOB: September 15, 1955   Cancelled Treatment:    Reason Eval/Treat Not Completed: Medical issues which prohibited therapy. Patient out for a procedure. Will attempt OT evaluation on different date and time.  Janine Reller OTR/L   Rucker Pridgeon 09/11/2019, 12:58 PM

## 2019-09-11 NOTE — Anesthesia Postprocedure Evaluation (Signed)
Anesthesia Post Note  Patient: Trinadee Verhagen  Procedure(s) Performed: ESOPHAGOGASTRODUODENOSCOPY (EGD) WITH PROPOFOL (N/A ) BIOPSY     Patient location during evaluation: Endoscopy Anesthesia Type: MAC Level of consciousness: awake and alert Pain management: pain level controlled Vital Signs Assessment: post-procedure vital signs reviewed and stable Respiratory status: spontaneous breathing, nonlabored ventilation and respiratory function stable Cardiovascular status: stable and blood pressure returned to baseline Postop Assessment: no apparent nausea or vomiting Anesthetic complications: no    Last Vitals:  Vitals:   09/11/19 0812 09/11/19 0900  BP: 125/76 112/73  Pulse: (!) 105 96  Resp: 20 19  Temp:    SpO2: 99% 96%    Last Pain:  Vitals:   09/11/19 0812  TempSrc:   PainSc: 0-No pain                 Catalina Gravel

## 2019-09-11 NOTE — Progress Notes (Signed)
PT Cancellation Note  Patient Details Name: Claudia Wiley MRN: [id number] DOB: [birth year]   Cancelled Treatment:    Reason Eval/Treat Not Completed: Medical issues which prohibited therapy, patient having procedure. Will check back tomorrow.   [name] 09/11/2019, 11:29 AM [place] Pager [phone] Office [phone]

## 2019-09-11 NOTE — Interval H&P Note (Signed)
History and Physical Interval Note:  09/11/2019 7:15 AM  Claudia Wiley  has presented today for surgery, with the diagnosis of epigastric pain, abnormal esophagus on CT scan.  The various methods of treatment have been discussed with the patient and family. After consideration of risks, benefits and other options for treatment, the patient has consented to  Procedure(s): ESOPHAGOGASTRODUODENOSCOPY (EGD) WITH PROPOFOL (N/A) with possible esophageal dilation as a surgical intervention.  The patient's history has been reviewed, patient examined, no change in status, stable for surgery.  I have reviewed the patient's chart and labs.  Questions were answered to the patient's satisfaction.     Dominic Pea [name]

## 2019-09-11 NOTE — Progress Notes (Signed)
Pt. Remains stable without need for BIPAP at this time.  Pt. States that she does not tolerate mask on face.  Will continue to monitor for need.

## 2019-09-11 NOTE — Op Note (Signed)
[facility] Patient Name: Claudia Wiley Procedure Date: 09/11/2019 MRN: [id number] Attending MD: Gerrit Heck , MD Date of Birth: [birth year] CSN: [id number] Age: 64 Admit Type: Inpatient Procedure:                Upper GI endoscopy Indications:              Epigastric abdominal pain, Dysphagia, Abnormal CT                            of the GI tract, Nausea with vomiting Providers:                Gerrit Heck, MD, Josie Dixon, RN, William Dalton, Technician Referring MD:              Medicines:                Monitored Anesthesia Care Complications:            No immediate complications. Estimated Blood Loss:     Estimated blood loss was minimal. Procedure:                Pre-Anesthesia Assessment:                           - Prior to the procedure, a History and Physical                            was performed, and patient medications and                            allergies were reviewed. The patient's tolerance of                            previous anesthesia was also reviewed. The risks                            and benefits of the procedure and the sedation                            options and risks were discussed with the patient.                            All questions were answered, and informed consent                            was obtained. Prior Anticoagulants: The patient has                            taken no previous anticoagulant or antiplatelet                            agents. ASA Grade Assessment: III - A patient with  severe systemic disease. After reviewing the risks                            and benefits, the patient was deemed in                            satisfactory condition to undergo the procedure.                           After obtaining informed consent, the endoscope was                            passed under direct vision. Throughout the   procedure, the patient's blood pressure, pulse, and                            oxygen saturations were monitored continuously. The                            [id number] Olympus gastroscope was                            introduced through the mouth, and advanced to the                            second part of duodenum. The upper GI endoscopy was                            accomplished without difficulty. The patient                            tolerated the procedure well. Scope In: Scope Out: Findings:      Diffuse mucosal changes characterized by essentially blacck appearing       mucosa was surface sloughing was found in the entire esophagus, with an       abrupt transition at the [employer]. Several biopsies were taken       throughout the esophagus with a cold forceps for histology. Other than 1       location, there was no bleeding noted with any of the biopsy sites.       Estimated blood loss was minimal. This transitioned to erythematous       mucosa at 20 cm, with the UES at 18 cm.      A 3 cm hiatal hernia was present.      The gastroesophageal flap valve was visualized endoscopically and       classified as Hill Grade IV (no fold, wide open lumen, hiatal hernia       present).      Scattered minimal inflammation characterized by erythema was found in       the gastric body, at the incisura and in the gastric antrum. No gastric       ulcers or erosions. Biopsies were taken with a cold forceps for       Helicobacter pylori testing. Estimated blood loss was minimal.      Few non-bleeding cratered and linear, serpiginous ulcers with no  stigmata of bleeding were found in the duodenal bulb and in the second       portion of the duodenum. The largest lesion was 5 mm in largest       dimension. Biopsies were taken with a cold forceps for histology.       Estimated blood loss was minimal. Impression:               - Profound mucosal changes in the entire esophagus                             characterized by circumferential black appearing                            mucosa, with endoscopic appearance highly                            suspicious for Acute Esophageal Necrosis. This was                            biopsied. Of note, there was essentially no                            bleeding at the biopsy sites. There was an abrupt                            transition to otherwise normal mucosal at the GEJ,                            again highly suggestive of Acute Esophagal Necrosis.                           - 3 cm hiatal hernia.                           - Gastroesophageal flap valve classified as Hill                            Grade IV (no fold, wide open lumen, hiatal hernia                            present).                           - Gastritis. Biopsied.                           - Non-bleeding duodenal ulcers with no stigmata of                            bleeding. Biopsied. Moderate Sedation:      Not Applicable - Patient had care per Anesthesia. Recommendation:           - Return patient to hospital ward for ongoing care.                           -  NPO today. Small sips of water with meds ok.                           - Continue present medications.                           - Use Protonix (pantoprazole) 40 mg PO BID.                           - Use sucralfate suspension 1 gram PO QID.                           - Await pathology results.                           - Resume antibiotics as currently prescribed.                           - Resume IV fluids.                           - Will re-evaluate tomorrow for ability to start                            clears. Procedure Code(s):        --- Professional ---                           [id number], Esophagogastroduodenoscopy, flexible,                            transoral; with biopsy, single or multiple Diagnosis Code(s):        --- Professional ---                           K22.8, Other  specified diseases of esophagus                           K44.9, Diaphragmatic hernia without obstruction or                            gangrene                           K29.70, Gastritis, unspecified, without bleeding                           K26.9, Duodenal ulcer, unspecified as acute or                            chronic, without hemorrhage or perforation                           R10.13, Epigastric pain                           R13.10, Dysphagia, unspecified  R11.2, Nausea with vomiting, unspecified                           R93.3, Abnormal findings on diagnostic imaging of                            other parts of digestive tract CPT copyright 2019 [employer]. All rights reserved. The codes documented in this report are preliminary and upon coder review may  be revised to meet current compliance requirements. Gerrit Heck, MD 09/11/2019 8:13:35 AM Number of Addenda: 0

## 2019-09-11 NOTE — Transfer of Care (Signed)
Immediate Anesthesia Transfer of Care Note  Patient: Claudia Wiley  Procedure(s) Performed: Procedure(s): ESOPHAGOGASTRODUODENOSCOPY (EGD) WITH PROPOFOL (N/A) BIOPSY  Patient Location: PACU  Anesthesia Type:MAC  Level of Consciousness:  sedated, patient cooperative and responds to stimulation  Airway & Oxygen Therapy:Patient Spontanous Breathing and Patient connected to face mask oxgen  Post-op Assessment:  Report given to PACU RN and Post -op Vital signs reviewed and stable  Post vital signs:  Reviewed and stable  Last Vitals:  Vitals:   09/11/19 0600 09/11/19 0658  BP: 113/60 129/88  Pulse: 97 (!) 104  Resp: 17 (!) 23  Temp:  36.7 C  SpO2: 36% 016%    Complications: No apparent anesthesia complications

## 2019-09-12 ENCOUNTER — Encounter: Payer: Self-pay | Admitting: *Deleted

## 2019-09-12 DIAGNOSIS — D72829 Elevated white blood cell count, unspecified: Secondary | ICD-10-CM

## 2019-09-12 DIAGNOSIS — R1310 Dysphagia, unspecified: Secondary | ICD-10-CM

## 2019-09-12 DIAGNOSIS — K222 Esophageal obstruction: Secondary | ICD-10-CM

## 2019-09-12 LAB — COMPREHENSIVE METABOLIC PANEL
ALT: 18 U/L (ref 0–44)
AST: 29 U/L (ref 15–41)
Albumin: 2.7 g/dL — ABNORMAL LOW (ref 3.5–5.0)
Alkaline Phosphatase: 59 U/L (ref 38–126)
Anion gap: 5 (ref 5–15)
BUN: 24 mg/dL — ABNORMAL HIGH (ref 8–23)
CO2: 28 mmol/L (ref 22–32)
Calcium: 7.8 mg/dL — ABNORMAL LOW (ref 8.9–10.3)
Chloride: 108 mmol/L (ref 98–111)
Creatinine, Ser: 0.4 mg/dL — ABNORMAL LOW (ref 0.44–1.00)
GFR calc Af Amer: >60 mL/min (ref >60)
GFR calc non Af Amer: >60 mL/min (ref >60)
Glucose, Bld: 65 mg/dL — ABNORMAL LOW (ref 70–99)
Potassium: 4.4 mmol/L (ref 3.5–5.1)
Sodium: 141 mmol/L (ref 135–145)
Total Bilirubin: 0.6 mg/dL (ref 0.3–1.2)
Total Protein: 5.3 g/dL — ABNORMAL LOW (ref 6.5–8.1)

## 2019-09-12 LAB — CBC
HCT: 36.3 % (ref 36.0–46.0)
Hemoglobin: 11.1 g/dL — ABNORMAL LOW (ref 12.0–15.0)
MCH: 28.7 pg (ref 26.0–34.0)
MCHC: 30.6 g/dL (ref 30.0–36.0)
MCV: 93.8 fL (ref 80.0–100.0)
Platelets: 226 10*3/uL (ref 150–400)
RBC: 3.87 MIL/uL (ref 3.87–5.11)
RDW: 15.5 % (ref 11.5–15.5)
WBC: 16.7 10*3/uL — ABNORMAL HIGH (ref 4.0–10.5)
nRBC: 0 % (ref 0.0–0.2)

## 2019-09-12 MED ORDER — LIDOCAINE VISCOUS HCL 2 % MT SOLN
15.0000 mL | Freq: Three times a day (TID) | OROMUCOSAL | Status: DC
  Administered 2019-09-12 – 2019-09-19 (×22): 15 mL via OROMUCOSAL
  Filled 2019-09-12 (×22): qty 15

## 2019-09-12 MED ORDER — LIDOCAINE VISCOUS HCL 2 % MT SOLN
15.0000 mL | Freq: Once | OROMUCOSAL | Status: AC
  Administered 2019-09-12: 16:00:00 15 mL via ORAL
  Filled 2019-09-12: qty 15

## 2019-09-12 MED ORDER — ALUM & MAG HYDROXIDE-SIMETH 200-200-20 MG/5ML PO SUSP
30.0000 mL | Freq: Once | ORAL | Status: AC
  Administered 2019-09-12: 16:00:00 30 mL via ORAL
  Filled 2019-09-12: qty 30

## 2019-09-12 MED ORDER — ALBUTEROL SULFATE (2.5 MG/3ML) 0.083% IN NEBU: 2.5000 mg | INHALATION_SOLUTION | RESPIRATORY_TRACT | Status: DC | PRN

## 2019-09-12 NOTE — Evaluation (Signed)
Physical Therapy Evaluation Patient Details Name: Claudia Wiley MRN: [id number] DOB: [birth year] Today's Date: 09/12/2019   History of Present Illness  64 year old female with a history of esophageal stricture requiring dilation in the past- 12/2017, admitted with COPD exacerbation, cough, N/V, epigastric pain, and dysphagia.  Additionally, history of LUL lobectomy and gastrinoma. Had EGD 09/11/19  Clinical Impression  Pt admitted as above and presenting with functional mobility limitations 2* generalized weakness, decreased endurance and balance deficits.  Pt hopes to progress to dc home with 24/7 assist of family.    Follow Up Recommendations Home health PT    Equipment Recommendations  None recommended by PT    Recommendations for Other Services       Precautions / Restrictions Precautions Precautions: Fall Precaution Comments: wheezing--sats 97-100% on 2 liters at rest and 3 liters with mobility Restrictions Weight Bearing Restrictions: No      Mobility  Bed Mobility Overal bed mobility: Needs Assistance Bed Mobility: Supine to Sit     Supine to sit: Min guard;HOB elevated     General bed mobility comments: Increased time with Multiple cues to intiate activity (asking her to sit up on side of bed she said okay but did not move, once we started cueing her through sequence of getting up she moved well)  Transfers Overall transfer level: Needs assistance Equipment used: Rolling walker (2 wheeled) Transfers: Sit to/from Stand Sit to Stand: Min assist;+2 safety/equipment         General transfer comment: cues for use of UEs to self assist  Ambulation/Gait Ambulation/Gait assistance: Min assist;+2 physical assistance;+2 safety/equipment Gait Distance (Feet): 78 Feet Assistive device: Rolling walker (2 wheeled) Gait Pattern/deviations: Step-to pattern;Step-through pattern;Shuffle;Trunk flexed Gait velocity: decr   General Gait Details: Increased time with standing rest  breaks; cues for posture and position from [employer]            Wheelchair Mobility    Modified Rankin (Stroke Patients Only)       Balance Overall balance assessment: Needs assistance Sitting-balance support: No upper extremity supported;Feet supported Sitting balance-Leahy Scale: Fair     Standing balance support: Bilateral upper extremity supported Standing balance-Leahy Scale: Poor Standing balance comment: reliant on RW                             Pertinent Vitals/Pain Pain Assessment: No/denies pain    Home Living Family/patient expects to be discharged to:: Private residence Living Arrangements: Children Available Help at Discharge: Family;Available PRN/intermittently Type of Home: Apartment Home Access: Stairs to enter Entrance Stairs-Rails: Right Entrance Stairs-Number of Steps: 3; right rail going up Home Layout: One level Home Equipment: Walker - 2 wheels;Shower seat;Wheelchair - manual;Cane - single point;Walker - 4 wheels Additional Comments: Pt likes to bathe in tub, requires assistance to get in/out of tub. Dtr works    Prior Function Level of Independence: Secondary school teacher / Transfers Assistance Needed: uses rollator PRN  ADL's / Homemaking Assistance Needed: family assists with household tasks and A her with some of her bathing and dressing  Comments: daughter and granddaughter work in the evening     Hand Dominance   Dominant Hand: Right    Extremity/Trunk Assessment   Upper Extremity Assessment Upper Extremity Assessment: Generalized weakness    Lower Extremity Assessment Lower Extremity Assessment: Generalized weakness       Communication   Communication: No difficulties  Cognition Arousal/Alertness: Lethargic(but rousable) Behavior During Therapy:  Flat affect Overall Cognitive Status: No family/caregiver present to determine baseline cognitive functioning Area of Impairment: Orientation;Safety/judgement                  Orientation Level: Disoriented to;Time       Safety/Judgement: Decreased awareness of safety;Decreased awareness of deficits     General Comments: repeated hospital to the question what year is it and month she stated December      General Comments      Exercises     Assessment/Plan    PT Assessment Patient needs continued PT services  PT Problem List Decreased strength;Decreased activity tolerance;Decreased balance;Decreased mobility;Decreased knowledge of use of DME       PT Treatment Interventions DME instruction;Gait training;Stair training;Functional mobility training;Therapeutic activities;Therapeutic exercise;Balance training;Patient/family education    PT Goals (Current goals can be found in the Care Plan section)  Acute Rehab PT Goals Patient Stated Goal: agreeable to get up and walk PT Goal Formulation: With patient Time For Goal Achievement: 09/25/19 Potential to Achieve Goals: Good    Frequency Min 3X/week   Barriers to discharge        Co-evaluation PT/OT/SLP Co-Evaluation/Treatment: Yes Reason for Co-Treatment: For patient/therapist safety PT goals addressed during session: Mobility/safety with mobility OT goals addressed during session: Strengthening/ROM;ADL's and self-care       AM-PAC PT "6 Clicks" Mobility  Outcome Measure Help needed turning from your back to your side while in a flat bed without using bedrails?: A Little Help needed moving from lying on your back to sitting on the side of a flat bed without using bedrails?: A Little Help needed moving to and from a bed to a chair (including a wheelchair)?: A Little Help needed standing up from a chair using your arms (e.g., wheelchair or bedside chair)?: A Little Help needed to walk in hospital room?: A Little Help needed climbing 3-5 steps with a railing? : A Lot 6 Click Score: 17    End of Session Equipment Utilized During Treatment: Gait belt;Oxygen Activity  Tolerance: Patient tolerated treatment well Patient left: in chair;with call bell/phone within reach;with chair alarm set Nurse Communication: Mobility status PT Visit Diagnosis: Unsteadiness on feet (R26.81);Muscle weakness (generalized) (M62.81);Difficulty in walking, not elsewhere classified (R26.2)    Time: [id number] PT Time Calculation (min) (ACUTE ONLY): 31 min   Charges:   PT Evaluation $PT Eval Moderate Complexity: 1 Mod          [place] Pager [phone] Office [phone]   Benigna Delisi 09/12/2019, 5:01 PM

## 2019-09-12 NOTE — Progress Notes (Signed)
Patient ID: Claudia Wiley, female   DOB: [birth year], 64 y.o.   MRN: [id number]    Progress Note   Subjective   Day # 3 CC; COPD exacerbation, epigastric pain nausea vomiting  EGD yesterday-severe mucosal changes in the entire esophagus with circumferential black appearing mucosa fishes for acute esophageal necrosis transition to normal tissue at the GE junction, nonbleeding duodenal ulcers  Biopsies pending WBC 16.7, hemoglobin 11.1  Sitting up in chair, taking ice chips, feels her breathing has improved.  No appetite  Objective   Vital signs in last 24 hours: Temp:  [97.6 F (36.4 C)-98.6 F (37 C)] 97.9 F (36.6 C) (02/28 0832) Pulse Rate:  [87-104] 89 (02/28 0400) Resp:  [6-19] 6 (02/28 0400) BP: (96-128)/(48-76) 104/69 (02/28 0400) SpO2:  [94 %-100 %] 100 % (02/28 0400) Weight:  [41.5 kg] 41.5 kg (02/28 0400) Last BM Date: (unknown) General:    Ill-appearing African-American female  in NAD Heart:  Regular rate and rhythm; no murmurs Lungs: Scattered rhonchi Abdomen:  Soft,  nondistended, incisional scars normal bowel sounds. Extremities:  Without edema. Neurologic:  Alert and oriented,  grossly normal neurologically. Psych:  Cooperative. Normal mood and affect.  Intake/Output from previous day: 02/27 0701 - 02/28 0700 In: 1348.1 [I.V.:902.3; IV Piggyback:445.7] Out: 400 [Urine:400] Intake/Output this shift: No intake/output data recorded.  Lab Results: Recent Labs    09/09/19 1637 09/10/19 0215 09/12/19 0325  WBC 31.9* 36.7* 16.7*  HGB 15.6* 13.8 11.1*  HCT 49.7* 43.7 36.3  PLT 247 253 226   BMET Recent Labs    09/09/19 1507 09/10/19 0215 09/12/19 0325  NA 142 138 141  K 5.5* 4.6 4.4  CL 92* 98 108  CO2 34* 29 28  GLUCOSE 90 155* 65*  BUN 37* 28* 24*  CREATININE 0.94 0.55 0.40*  CALCIUM 9.7 8.3* 7.8*   LFT Recent Labs    09/12/19 0325  PROT 5.3*  ALBUMIN 2.7*  AST 29  ALT 18  ALKPHOS 59  BILITOT 0.6   PT/INR No results for input(s):  LABPROT, INR in the last 72 hours.  Studies/Results: DG Chest 2 View  Result Date: 09/10/2019 CLINICAL DATA:  Shortness of breath EXAM: CHEST - 2 VIEW COMPARISON:  09/09/2019 FINDINGS: The heart size and mediastinal contours are within normal limits. Rounded nodular densities projecting over the bilateral lung fields suggestive of nipple shadows. Lungs are mildly hyperexpanded. Mild left basilar scarring, unchanged. Both lungs are clear. The visualized skeletal structures are unremarkable. Abdominal aortic atherosclerotic calcifications noted. IMPRESSION: No acute cardiopulmonary disease. Mild hyperexpansion of the lungs. Abdominal aortic atherosclerosis. Electronically Signed   By: Davina Poke D.O.   On: 09/10/2019 10:16       Assessment / Plan:    #26 64 year old African-American female with multiple medical problems admitted with hypoxic respiratory failure secondary to COPD exacerbation Improving             #2 severe esophagitis with black appearing mucosa of the entire esophagus concerning for esophageal necrosis Biopsies pending Continue IV PPI twice daily Carafate suspension 4 times daily  We will start sips of clears Viscous lidocaine 15 cc 30 minutes before meals  #2 duodenal ulcers #3 chronic pain syndrome #4 Significant leukocytosis-proving-likely secondary to esophageal process as above    Active Problems:   COPD with acute exacerbation (HCC)   Respiratory failure (HCC)   Acute esophagitis   Esophageal dysphagia   Esophageal stenosis     LOS: 3 days   Jansen Sciuto EsterwoodPA-C  09/12/2019, 8:52 AM

## 2019-09-12 NOTE — Progress Notes (Signed)
PROGRESS NOTE    Claudia Wiley  [id number] DOB: Mar 05, 1956 DOA: 09/09/2019 PCP: System, Pcp Not In    Brief Narrative: 64 year old black female with history of esophageal stricture and dilatation 01/2018, gastrinoma status post Whipple's procedure, depression admitted with intractable nausea and vomiting. History mainly obtained from the patient's daughter. Per daughter she has not been able to eat anything or drink anything she continues to vomit. She also has severe abdominal pain denies diarrhea. Family moved here in 20[address]from [place] where she had surgery on her stomach for gastrinoma which was removed. 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  09/10/2019 patient very emotional tearful crying complaining of abdominal pain She has not had anything to eat or drink today. At the time of admission ABG 7.1 8/104/31 she was put on BiPAP Repeat ABG 7.2 8/71/73 Significant labs white count 31.9 at the time of admission up to 36.7 with a hemoglobin of 13.8 platelet count of 253, chest x-ray no acute disease mild hyperexpansion of the lungs.  09/11/2019 seen patient in the room after EGD daughter present in the room answered all the questions patient continues to have sneezing after the procedure.  09/12/2019 patient resting in bed discussed with overnight staff reports patient has not urinated the whole night. No vomiting  Assessment & Plan:   Active Problems:   COPD with acute exacerbation (HCC)   Respiratory failure (HCC)   Acute esophagitis   Esophageal dysphagia   Esophageal stenosis   #1 esophagitis with EGD findings suspicious for acute esophageal necrosis and nonbleeding duodenal ulcers Biopsies pending. CT abdomen and pelvis shows-fluid-filled distal esophagus with submucosal edema and inflammation along the distal esophagus consistent with esophagitis. Moderate volume stool in the ascending colon and proximal  transverse colon. No obstruction identified. egd 09/11/2019-profound mucosal changes in the entire esophagus black appearing mucosa suspicious for acute esophageal necrosis.  Biopsies done.  Nonbleeding duodenal ulcers with no stigmata of bleeding biopsied. Patient is tolerating sips of water with pills. Will defer to GI for advancing her diet. Continue sucralfate and Protonix. Continue cefepime and Flagyl.  Positive by 2605  #2acute hypoxic and hypercarbic respiratory failure secondary to COPD exacerbation.  She reports feeling better but not back to her baseline. Chest x-ray clear other than findings of COPD. Continue Solu-Medrol taper as patient can tolerate. Continue duo nebs. Continue Spiriva.  #3anxiety with depression Xanax and Elavil and Remeron  #4chronic pain continue tramadol  #5leukocytosis patient had leukocytosis at the time of admission even before getting the steroids. UA is negative chest x-ray is negative.  On cefepime and Flagyl.  White count has dropped to 16.7 from 36.7 yesterday.   #6 history of essential hypertension blood pressure 104/69.  Continue to hold Norvasc.     Nutrition Problem: Inadequate oral intake Etiology: acute illness, nausea, vomiting     Signs/Symptoms: per patient/family report, NPO status    Interventions: Refer to RD note for recommendations  Estimated body mass index is 18.48 kg/m as calculated from the following:   Height as of this encounter: 4\' 11"  (1.499 m).   Weight as of this encounter: 41.5 kg.  DVT prophylaxis: Lovenox Code Status: Full code Family Communication: Discussed with patient's daughter Disposition Plan: Patient came from home.  Patient will likely be discharged home after physical therapy evaluation.  Barriers to discharge patient has been n.p.o. due to findings of acute esophageal necrosis.  Need diet advanced and patient  able to tolerate prior to discharge. Consultants:   GI  Procedures: None  Antimicrobials: Cefepime and Flagyl to cover intra-abdominal pathology, COPD, findings of acute esophageal necrosis  Subjective: Resting in bed reports she is better She reports she wants to urinate denies any dysuria  Objective: Vitals:   09/12/19 0000 09/12/19 0323 09/12/19 0400 09/12/19 0832  BP: 112/73  104/69   Pulse: (!) 101  89   Resp: 12  (!) 6   Temp:  97.6 F (36.4 C)  97.9 F (36.6 C)  TempSrc:  Oral  Oral  SpO2: 94%  100%   Weight:   41.5 kg   Height:        Intake/Output Summary (Last 24 hours) at 09/12/2019 1053 Last data filed at 09/12/2019 1000 Gross per 24 hour  Intake 2239.66 ml  Output 400 ml  Net 1839.66 ml   Filed Weights   09/09/19 2300 09/11/19 0658 09/12/19 0400  Weight: 37.7 kg 37.7 kg 41.5 kg    Examination:  General exam: Appears calm and comfortable  Respiratory system: Scattered wheezing to auscultation. Respiratory effort normal. Cardiovascular system: S1 & S2 heard, RRR. No JVD, murmurs, rubs, gallops or clicks. No pedal edema. Gastrointestinal system: Abdomen is nondistended, soft and nontender. No organomegaly or masses felt. Normal bowel sounds heard. Central nervous system: Alert and oriented. No focal neurological deficits. Extremities: Symmetric 5 x 5 power. Skin: No rashes, lesions or ulcers Psychiatry: Judgement and insight appear normal. Mood & affect appropriate.     Data Reviewed: I have personally reviewed following labs and imaging studies  CBC: Recent Labs  Lab 09/09/19 1637 09/10/19 0215 09/12/19 0325  WBC 31.9* 36.7* 16.7*  NEUTROABS 29.2*  --   --   HGB 15.6* 13.8 11.1*  HCT 49.7* 43.7 36.3  MCV 91.7 91.4 93.8  PLT 247 253 [id number]   Basic Metabolic Panel: Recent Labs  Lab 09/09/19 1507 09/10/19 0215 09/12/19 0325  NA 142 138 141  K 5.5* 4.6 4.4  CL 92* 98 108  CO2 34* 29 28  GLUCOSE 90 155* 65*  BUN 37* 28* 24*  CREATININE 0.94 0.55 0.40*  CALCIUM 9.7 8.3* 7.8*   GFR: Estimated Creatinine  Clearance: 48.4 mL/min (A) (by C-G formula based on SCr of 0.4 mg/dL (L)). Liver Function Tests: Recent Labs  Lab 09/10/19 0215 09/12/19 0325  AST 22 29  ALT 17 18  ALKPHOS 82 59  BILITOT 0.4 0.6  PROT 6.6 5.3*  ALBUMIN 3.5 2.7*   No results for input(s): LIPASE, AMYLASE in the last 168 hours. No results for input(s): AMMONIA in the last 168 hours. Coagulation Profile: No results for input(s): INR, PROTIME in the last 168 hours. Cardiac Enzymes: No results for input(s): CKTOTAL, CKMB, CKMBINDEX, TROPONINI in the last 168 hours. BNP (last 3 results) No results for input(s): PROBNP in the last 8760 hours. HbA1C: No results for input(s): HGBA1C in the last 72 hours. CBG: No results for input(s): GLUCAP in the last 168 hours. Lipid Profile: No results for input(s): CHOL, HDL, LDLCALC, TRIG, CHOLHDL, LDLDIRECT in the last 72 hours. Thyroid Function Tests: No results for input(s): TSH, T4TOTAL, FREET4, T3FREE, THYROIDAB in the last 72 hours. Anemia Panel: No results for input(s): VITAMINB12, FOLATE, FERRITIN, TIBC, IRON, RETICCTPCT in the last 72 hours. Sepsis Labs: No results for input(s): PROCALCITON, LATICACIDVEN in the last 168 hours.  Recent Results (from the past 240 hour(s))  Respiratory Panel by RT PCR (Flu A&B, Covid) - Nasopharyngeal Swab  Status: None   Collection Time: 09/09/19  3:08 PM   Specimen: Nasopharyngeal Swab  Result Value Ref Range Status   SARS Coronavirus 2 by RT PCR NEGATIVE NEGATIVE Final    Comment: (NOTE) SARS-CoV-2 target nucleic acids are NOT DETECTED. The SARS-CoV-2 RNA is generally detectable in upper respiratoy specimens during the acute phase of infection. The lowest concentration of SARS-CoV-2 viral copies this assay can detect is 131 copies/mL. A negative result does not preclude SARS-Cov-2 infection and should not be used as the sole basis for treatment or other patient management decisions. A negative result may occur with  improper  specimen collection/handling, submission of specimen other than nasopharyngeal swab, presence of viral mutation(s) within the areas targeted by this assay, and inadequate number of viral copies (<131 copies/mL). A negative result must be combined with clinical observations, patient history, and epidemiological information. The expected result is Negative. Fact Sheet for Patients:  PinkCheek.be Fact Sheet for Healthcare Providers:  GravelBags.it This test is not yet ap proved or cleared by the [place] FDA and  has been authorized for detection and/or diagnosis of SARS-CoV-2 by FDA under an Emergency Use Authorization (EUA). This EUA will remain  in effect (meaning this test can be used) for the duration of the COVID-19 declaration under Section 564(b)(1) of the Act, 21 U.S.C. section 360bbb-3(b)(1), unless the authorization is terminated or revoked sooner.    Influenza A by PCR NEGATIVE NEGATIVE Final   Influenza B by PCR NEGATIVE NEGATIVE Final    Comment: (NOTE) The Xpert Xpress SARS-CoV-2/FLU/RSV assay is intended as an aid in  the diagnosis of influenza from Nasopharyngeal swab specimens and  should not be used as a sole basis for treatment. Nasal washings and  aspirates are unacceptable for Xpert Xpress SARS-CoV-2/FLU/RSV  testing. Fact Sheet for Patients: PinkCheek.be Fact Sheet for Healthcare Providers: GravelBags.it This test is not yet approved or cleared by the [place] FDA and  has been authorized for detection and/or diagnosis of SARS-CoV-2 by  FDA under an Emergency Use Authorization (EUA). This EUA will remain  in effect (meaning this test can be used) for the duration of the  Covid-19 declaration under Section 564(b)(1) of the Act, 21  U.S.C. section 360bbb-3(b)(1), unless the authorization is  terminated or revoked. Performed at [facility], [place] [address] [place], [place] [address]   MRSA PCR Screening     Status: None   Collection Time: 09/09/19  9:22 PM   Specimen: Nasopharyngeal  Result Value Ref Range Status   MRSA by PCR NEGATIVE NEGATIVE Final    Comment:        The GeneXpert MRSA Assay (FDA approved for NASAL specimens only), is one component of a comprehensive MRSA colonization surveillance program. It is not intended to diagnose MRSA infection nor to guide or monitor treatment for MRSA infections. Performed at [facility], [place] [address], [place], [place] [address]          Radiology Studies: No results found.      Scheduled Meds: . amitriptyline  10 mg Oral QHS  . Chlorhexidine Gluconate Cloth  6 each Topical Daily  . fluticasone  2 spray Each Nare Daily  . guaiFENesin  600 mg Oral BID  . ipratropium-albuterol  3 mL Nebulization Once  . ipratropium-albuterol  3 mL Nebulization TID  . lipase/protease/amylase  24,000 Units Oral TID WC & HS  . mouth rinse  15 mL Mouth Rinse BID  . mirtazapine  15  mg Oral QHS  . pantoprazole (PROTONIX) IV  40 mg Intravenous Q12H  . predniSONE  40 mg Oral Q breakfast  . sucralfate  1 g Oral Q6H   Continuous Infusions: . sodium chloride 100 mL/hr at 09/12/19 1000  . ceFEPime (MAXIPIME) IV Stopped (09/12/19 0933)  . metronidazole Stopped (09/12/19 [id number])     LOS: 3 days     Georgette Shell, MD 09/12/2019, 10:53 AM

## 2019-09-12 NOTE — Evaluation (Signed)
Occupational Therapy Evaluation Patient Details Name: Claudia Wiley MRN: [id number] DOB: 06-23-1956 Today's Date: 09/12/2019    History of Present Illness 64 year old female with a history of esophageal stricture requiring dilation in the past- 12/2017, admitted with COPD exacerbation, cough, N/V, epigastric pain, and dysphagia.  Additionally, history of LUL lobectomy and gastrinoma. Had EGD 09/11/19   Clinical Impression   This 64 yo female admitted with above presents to acute OT with PLOF per her report of being able to self toilet, needing A in and out of tub, and some A with bathing and dressing tasks from a rollator level. Pt now presents at a min A level (+2 safety/lines) when up on her feet and increased A for basic ADLs. She will benefit from acute OT with follow up [place] [facility] SNF depending on 24/7 A availability at home. We will continue to follow.    Follow Up Recommendations  Home health OT;Supervision/Assistance - 24 hour;Other (comment)(SNF if does not have 24/7 S/A at home)    Equipment Recommendations  None recommended by OT       Precautions / Restrictions Precautions Precautions: Fall Precaution Comments: wheezing--sats 97-100% on 2 liters at rest and 3 liters with mobility Restrictions Weight Bearing Restrictions: No      Mobility Bed Mobility Overal bed mobility: Needs Assistance Bed Mobility: Supine to Sit     Supine to sit: Min guard;HOB elevated     General bed mobility comments: Multiple cues to intiate activity (asking her to sit up on side of bed she said okay but did not move, once we started cueing her through sequence of getting up she moved well)  Transfers Overall transfer level: Needs assistance Equipment used: Rolling walker (2 wheeled) Transfers: Sit to/from Stand Sit to Stand: Min assist;+2 safety/equipment              Balance Overall balance assessment: Needs assistance Sitting-balance support: No upper extremity supported;Feet  supported Sitting balance-Leahy Scale: Fair     Standing balance support: Bilateral upper extremity supported Standing balance-Leahy Scale: Poor Standing balance comment: reliant on RW                           ADL either performed or assessed with clinical judgement   ADL Overall ADL's : Needs assistance/impaired Eating/Feeding: Set up;Sitting Eating/Feeding Details (indicate cue type and reason): ice chips Grooming: Minimal assistance;Sitting   Upper Body Bathing: Minimal assistance;Sitting   Lower Body Bathing: Moderate assistance Lower Body Bathing Details (indicate cue type and reason): min A +2 sit<>stand for safety/equipment Upper Body Dressing : Minimal assistance;Sitting   Lower Body Dressing: Moderate assistance Lower Body Dressing Details (indicate cue type and reason): min A +2 sit<>stand for safety/equipment Toilet Transfer: Minimal assistance;+2 for safety/equipment;RW Toilet Transfer Details (indicate cue type and reason): bed>out and down hallway>sit in chair behind her Toileting- Clothing Manipulation and Hygiene: Moderate assistance Toileting - Clothing Manipulation Details (indicate cue type and reason): min A +2 sit<>stand for safety/equipment             Vision Patient Visual Report: No change from baseline              Pertinent Vitals/Pain Pain Assessment: No/denies pain     Hand Dominance Right   Extremity/Trunk Assessment Upper Extremity Assessment Upper Extremity Assessment: Generalized weakness           Communication Communication Communication: No difficulties   Cognition Arousal/Alertness: Lethargic Behavior During Therapy: Flat affect Overall  Cognitive Status: No family/caregiver present to determine baseline cognitive functioning Area of Impairment: Orientation;Safety/judgement                 Orientation Level: Disoriented to;Time       Safety/Judgement: Decreased awareness of safety;Decreased  awareness of deficits     General Comments: repeated hospital to the question what year is it and month she stated December              Home Living Family/patient expects to be discharged to:: Private residence Living Arrangements: Children Available Help at Discharge: Family;Available PRN/intermittently Type of Home: Apartment Home Access: Stairs to enter Entrance Stairs-Number of Steps: 3; right rail going up Entrance Stairs-Rails: Right Home Layout: One level     Bathroom Shower/Tub: Tub/shower unit         Home Equipment: Environmental consultant - 2 wheels;Shower seat;Wheelchair - manual;Cane - single point;Walker - 4 wheels   Additional Comments: Pt likes to bathe in tub, requires assistance to get in/out of tub. Dtr works      Prior Functioning/Environment Level of Independence: Needs Product/process development scientist / Transfers Assistance Needed: uses rollator PRN ADL's / Homemaking Assistance Needed: family assists with household tasks and A her with some of her bathing and dressing            OT Problem List: Decreased strength;Impaired balance (sitting and/or standing);Decreased safety awareness;Decreased cognition      OT Treatment/Interventions: Self-care/ADL training;DME and/or AE instruction;Patient/family education;Balance training    OT Goals(Current goals can be found in the care plan section) Acute Rehab OT Goals Patient Stated Goal: agreeable to get up and walk OT Goal Formulation: With patient Time For Goal Achievement: 09/26/19 Potential to Achieve Goals: Good  OT Frequency: Min 2X/week   Barriers to D/C: Decreased caregiver support  Pt reports her dtr works (do not feel pt is safe to be home alone because she needs A anytime she is up on her feet)       Co-evaluation PT/OT/SLP Co-Evaluation/Treatment: Yes Reason for Co-Treatment: For patient/therapist safety;To address functional/ADL transfers PT goals addressed during session: Strengthening/ROM;Proper use of  DME;Balance;Mobility/safety with mobility OT goals addressed during session: Strengthening/ROM;ADL's and self-care      AM-PAC OT "6 Clicks" Daily Activity     Outcome Measure Help from another person eating meals?: A Little Help from another person taking care of personal grooming?: A Little Help from another person toileting, which includes using toliet, bedpan, or urinal?: A Lot Help from another person bathing (including washing, rinsing, drying)?: A Lot Help from another person to put on and taking off regular upper body clothing?: A Lot Help from another person to put on and taking off regular lower body clothing?: A Lot 6 Click Score: 14   End of Session Equipment Utilized During Treatment: Gait belt;Rolling walker;Oxygen(2 liters at rest and 3 with mobility) Nurse Communication: Mobility status(ok'd ice chips)  Activity Tolerance: Patient tolerated treatment well(despite wheezing) Patient left: in chair;with call bell/phone within reach  OT Visit Diagnosis: Unsteadiness on feet (R26.81);Other abnormalities of gait and mobility (R26.89);Muscle weakness (generalized) (M62.81)                Time: [id number] OT Time Calculation (min): 32 min Charges:  OT General Charges $OT Visit: 1 Visit OT Evaluation $OT Eval Moderate Complexity: 1 Mod  Cathy , OTR/L Acute [employer] Pager [phone] Office [phone]    09/12/2019, 8:55 AM

## 2019-09-13 DIAGNOSIS — K209 Esophagitis, unspecified without bleeding: Secondary | ICD-10-CM

## 2019-09-13 DIAGNOSIS — R1310 Dysphagia, unspecified: Secondary | ICD-10-CM

## 2019-09-13 LAB — COMPREHENSIVE METABOLIC PANEL
ALT: 17 U/L (ref 0–44)
AST: 25 U/L (ref 15–41)
Albumin: 2.6 g/dL — ABNORMAL LOW (ref 3.5–5.0)
Alkaline Phosphatase: 60 U/L (ref 38–126)
Anion gap: 4 — ABNORMAL LOW (ref 5–15)
BUN: 11 mg/dL (ref 8–23)
CO2: 28 mmol/L (ref 22–32)
Calcium: 7.9 mg/dL — ABNORMAL LOW (ref 8.9–10.3)
Chloride: 107 mmol/L (ref 98–111)
Creatinine, Ser: 0.34 mg/dL — ABNORMAL LOW (ref 0.44–1.00)
GFR calc Af Amer: >60 mL/min (ref >60)
GFR calc non Af Amer: >60 mL/min (ref >60)
Glucose, Bld: 104 mg/dL — ABNORMAL HIGH (ref 70–99)
Potassium: 3.6 mmol/L (ref 3.5–5.1)
Sodium: 139 mmol/L (ref 135–145)
Total Bilirubin: 0.2 mg/dL — ABNORMAL LOW (ref 0.3–1.2)
Total Protein: 5.2 g/dL — ABNORMAL LOW (ref 6.5–8.1)

## 2019-09-13 LAB — CBC
HCT: 39.4 % (ref 36.0–46.0)
Hemoglobin: 12 g/dL (ref 12.0–15.0)
MCH: 28.4 pg (ref 26.0–34.0)
MCHC: 30.5 g/dL (ref 30.0–36.0)
MCV: 93.1 fL (ref 80.0–100.0)
Platelets: 226 10*3/uL (ref 150–400)
RBC: 4.23 MIL/uL (ref 3.87–5.11)
RDW: 15.3 % (ref 11.5–15.5)
WBC: 11.4 10*3/uL — ABNORMAL HIGH (ref 4.0–10.5)
nRBC: 0 % (ref 0.0–0.2)

## 2019-09-13 LAB — MAGNESIUM: Magnesium: 2.2 mg/dL (ref 1.7–2.4)

## 2019-09-13 MED ORDER — POTASSIUM CHLORIDE 10 MEQ/100ML IV SOLN
10.0000 meq | INTRAVENOUS | Status: AC
  Administered 2019-09-13 (×4): 10 meq via INTRAVENOUS
  Filled 2019-09-13 (×4): qty 100

## 2019-09-13 NOTE — Progress Notes (Signed)
PROGRESS NOTE    Claudia Wiley  [id number] DOB: 06-10-1956 DOA: 09/09/2019 PCP: System, Pcp Not In    Brief Narrative: 64 year old black female with history of esophageal stricture and dilatation 01/2018, gastrinoma status post Whipple's procedure, depression admitted with intractable nausea and vomiting. History mainly obtained from the patient's daughter. Per daughter she has not been able to eat anything or drink anything she continues to vomit. She also has severe abdominal pain denies diarrhea. Family moved here in [address]from [place] where she had surgery on her stomach for gastrinoma which was removed. She has a history of lobectomy of the left upper lobe due to lung cancer.  Assessment & Plan:   Active Problems:   COPD with acute exacerbation (HCC)   Respiratory failure (HCC)   Acute esophagitis   Esophageal dysphagia   Esophageal stenosis   Odynophagia   Leukocytosis   #1 esophagitis with EGD findings suspicious for acute esophageal necrosis and nonbleeding duodenal ulcers Biopsies pending. CT abdomen and pelvis shows-fluid-filled distal esophagus with submucosal edema and inflammation along the distal esophagus consistent with esophagitis. Moderate volume stool in the ascending colon and proximal transverse colon. No obstruction identified. egd2/27/2021-profound mucosal changes in the entire esophagus black appearing mucosa suspicious for acute esophageal necrosis. Biopsies done. Nonbleeding duodenal ulcers with no stigmata of bleeding biopsied. Patient is tolerating sips of water with pills. Will defer to GI for advancing her diet. Continue sucralfate and Protonix. Continue cefepime and Flagyl. Positive by [id number]  #2acute hypoxic and hypercarbic respiratory failure secondary to COPD exacerbation.  She reports feeling better but not back to her baseline. Chest x-ray clear other than findings of COPD. Continue Solu-Medrol taper as patient can tolerate. Continue duo  nebs. Continue Spiriva.  #3anxiety with depression Xanax and Elavil and Remeron  #4chronic pain continue tramadol  #5leukocytosis resolving 11.4 down from 36.7 on admit  Secondary to esophageal necrosis  #6 history of essential hypertension blood pressure 126/73  hold norvasc     Nutrition Problem: Inadequate oral intake Etiology: acute illness, nausea, vomiting     Signs/Symptoms: per patient/family report, NPO status    Interventions: Refer to RD note for recommendations  Estimated body mass index is 18.48 kg/m as calculated from the following:   Height as of this encounter: 4\' 11"  (1.499 m).   Weight as of this encounter: 41.5 kg.  DVT prophylaxis: Lovenox Code Status: Full code Family Communication: Discussed with patient's daughter Disposition Plan: Patient came from home.  Patient will likely be discharged home after physical therapy evaluation.  Barriers to discharge patient just started on clears  has been n.p.o. due to findings of acute esophageal necrosis.  Need diet advanced and patient able to tolerate prior to discharge. Consultants:   GI  Procedures: None Antimicrobials: Cefepime and Flagyl to cover intra-abdominal pathology, COPD, findings of acute esophageal necrosis  Subjective: Up in commode c/o severe pain cant tolerate clears   Objective: Vitals:   09/13/19 0625 09/13/19 0735 09/13/19 0805 09/13/19 1033  BP: 126/73     Pulse: 93   95  Resp: 20   20  Temp: 98.4 F (36.9 C)     TempSrc: Oral     SpO2: 100% 100% 96% 98%  Weight:      Height:        Intake/Output Summary (Last 24 hours) at 09/13/2019 1109 Last data filed at 09/13/2019 0600 Gross per 24 hour  Intake 1944.2 ml  Output 600 ml  Net 1344.2 ml   [employer]  09/09/19 2300 09/11/19 0658 09/12/19 0400  Weight: 37.7 kg 37.7 kg 41.5 kg    Examination:  General exam: Appears calm and comfortable  Respiratory system: Clear to auscultation. Respiratory effort normal.  Cardiovascular system: S1 & S2 heard, RRR. No JVD, murmurs, rubs, gallops or clicks. No pedal edema. Gastrointestinal system: Abdomen is nondistended, soft and tender epigastrium  No organomegaly or masses felt. Normal bowel sounds heard. Central nervous system: Alert and oriented. No focal neurological deficits. Extremities: Symmetric 5 x 5 power. Skin: No rashes, lesions or ulcers Psychiatry: Judgement and insight appear normal. Mood & affect appropriate.     Data Reviewed: I have personally reviewed following labs and imaging studies  CBC: Recent Labs  Lab 09/09/19 1637 09/10/19 0215 09/12/19 0325 09/13/19 0517  WBC 31.9* 36.7* 16.7* 11.4*  NEUTROABS 29.2*  --   --   --   HGB 15.6* 13.8 11.1* 12.0  HCT 49.7* 43.7 36.3 39.4  MCV 91.7 91.4 93.8 93.1  PLT 247 253 226 [id number]   Basic Metabolic Panel: Recent Labs  Lab 09/09/19 1507 09/10/19 0215 09/12/19 0325 09/13/19 0517 09/13/19 0700  NA 142 138 141 139  --   K 5.5* 4.6 4.4 3.6  --   CL 92* 98 108 107  --   CO2 34* 29 28 28   --   GLUCOSE 90 155* 65* 104*  --   BUN 37* 28* 24* 11  --   CREATININE 0.94 0.55 0.40* 0.34*  --   CALCIUM 9.7 8.3* 7.8* 7.9*  --   MG  --   --   --   --  2.2   GFR: Estimated Creatinine Clearance: 48.4 mL/min (A) (by C-G formula based on SCr of 0.34 mg/dL (L)). Liver Function Tests: Recent Labs  Lab 09/10/19 0215 09/12/19 0325 09/13/19 0517  AST 22 29 25   ALT 17 18 17   ALKPHOS 82 59 60  BILITOT 0.4 0.6 0.2*  PROT 6.6 5.3* 5.2*  ALBUMIN 3.5 2.7* 2.6*   No results for input(s): LIPASE, AMYLASE in the last 168 hours. No results for input(s): AMMONIA in the last 168 hours. Coagulation Profile: No results for input(s): INR, PROTIME in the last 168 hours. Cardiac Enzymes: No results for input(s): CKTOTAL, CKMB, CKMBINDEX, TROPONINI in the last 168 hours. BNP (last 3 results) No results for input(s): PROBNP in the last 8760 hours. HbA1C: No results for input(s): HGBA1C in the last 72  hours. CBG: No results for input(s): GLUCAP in the last 168 hours. Lipid Profile: No results for input(s): CHOL, HDL, LDLCALC, TRIG, CHOLHDL, LDLDIRECT in the last 72 hours. Thyroid Function Tests: No results for input(s): TSH, T4TOTAL, FREET4, T3FREE, THYROIDAB in the last 72 hours. Anemia Panel: No results for input(s): VITAMINB12, FOLATE, FERRITIN, TIBC, IRON, RETICCTPCT in the last 72 hours. Sepsis Labs: No results for input(s): PROCALCITON, LATICACIDVEN in the last 168 hours.  Recent Results (from the past 240 hour(s))  Respiratory Panel by RT PCR (Flu A&B, Covid) - Nasopharyngeal Swab     Status: None   Collection Time: 09/09/19  3:08 PM   Specimen: Nasopharyngeal Swab  Result Value Ref Range Status   SARS Coronavirus 2 by RT PCR NEGATIVE NEGATIVE Final    Comment: (NOTE) SARS-CoV-2 target nucleic acids are NOT DETECTED. The SARS-CoV-2 RNA is generally detectable in upper respiratoy specimens during the acute phase of infection. The lowest concentration of SARS-CoV-2 viral copies this assay can detect is 131 copies/mL. A negative result does not preclude SARS-Cov-2 infection  and should not be used as the sole basis for treatment or other patient management decisions. A negative result may occur with  improper specimen collection/handling, submission of specimen other than nasopharyngeal swab, presence of viral mutation(s) within the areas targeted by this assay, and inadequate number of viral copies (<131 copies/mL). A negative result must be combined with clinical observations, patient history, and epidemiological information. The expected result is Negative. Fact Sheet for Patients:  PinkCheek.be Fact Sheet for Healthcare Providers:  GravelBags.it This test is not yet ap proved or cleared by the [place] FDA and  has been authorized for detection and/or diagnosis of SARS-CoV-2 by FDA under an Emergency Use  Authorization (EUA). This EUA will remain  in effect (meaning this test can be used) for the duration of the COVID-19 declaration under Section 564(b)(1) of the Act, 21 U.S.C. section 360bbb-3(b)(1), unless the authorization is terminated or revoked sooner.    Influenza A by PCR NEGATIVE NEGATIVE Final   Influenza B by PCR NEGATIVE NEGATIVE Final    Comment: (NOTE) The Xpert Xpress SARS-CoV-2/FLU/RSV assay is intended as an aid in  the diagnosis of influenza from Nasopharyngeal swab specimens and  should not be used as a sole basis for treatment. Nasal washings and  aspirates are unacceptable for Xpert Xpress SARS-CoV-2/FLU/RSV  testing. Fact Sheet for Patients: PinkCheek.be Fact Sheet for Healthcare Providers: GravelBags.it This test is not yet approved or cleared by the [place] FDA and  has been authorized for detection and/or diagnosis of SARS-CoV-2 by  FDA under an Emergency Use Authorization (EUA). This EUA will remain  in effect (meaning this test can be used) for the duration of the  Covid-19 declaration under Section 564(b)(1) of the Act, 21  U.S.C. section 360bbb-3(b)(1), unless the authorization is  terminated or revoked. Performed at [facility], [place] [address] [place], [place] [address]   MRSA PCR Screening     Status: None   Collection Time: 09/09/19  9:22 PM   Specimen: Nasopharyngeal  Result Value Ref Range Status   MRSA by PCR NEGATIVE NEGATIVE Final    Comment:        The GeneXpert MRSA Assay (FDA approved for NASAL specimens only), is one component of a comprehensive MRSA colonization surveillance program. It is not intended to diagnose MRSA infection nor to guide or monitor treatment for MRSA infections. Performed at [facility], [place] [address] [place], [place] [address]          Radiology Studies: No results found.      Scheduled Meds:  . amitriptyline  10 mg Oral QHS  . Chlorhexidine Gluconate Cloth  6 each Topical Daily  . fluticasone  2 spray Each Nare Daily  . guaiFENesin  600 mg Oral BID  . ipratropium-albuterol  3 mL Nebulization Once  . ipratropium-albuterol  3 mL Nebulization TID  . lidocaine  15 mL Mouth/Throat TID AC  . lipase/protease/amylase  24,000 Units Oral TID WC & HS  . mouth rinse  15 mL Mouth Rinse BID  . mirtazapine  15 mg Oral QHS  . pantoprazole (PROTONIX) IV  40 mg Intravenous Q12H  . predniSONE  40 mg Oral Q breakfast  . sucralfate  1 g Oral Q6H   Continuous Infusions: . sodium chloride 100 mL/hr at 09/13/19 0728  . ceFEPime (MAXIPIME) IV Stopped (09/12/19 2205)  . metronidazole 500 mg (09/13/19 0521)  . potassium chloride 10 mEq (09/13/19 1055)     LOS: 4 days  Georgette Shell, MD  Password [facility] 09/13/2019, 11:09 AM

## 2019-09-13 NOTE — TOC Initial Note (Signed)
Transition of Care [facility]) - Initial/Assessment Note    Patient Details  Name: Claudia Wiley MRN: [id number] Date of Birth: [birth year]  [facility] CM/SW Contact:    Dessa Phi, RN Phone Number: 09/13/2019, 3:54 PM  Clinical Narrative:Spoke to patient's dtr [name] states patient's pcp-[facility]-Dr. Myer Haff await call back for [facility] agency in contract.Used [facility] in past await response from [facility].                   Expected Discharge Plan: [place] Barriers to Discharge: Continued Medical Work up   Patient Goals and CMS Choice        Expected Discharge Plan and Services Expected Discharge Plan: [place]   Discharge Planning Services: CM Consult   Living arrangements for the past 2 months: Single Family Home                                      Prior Living Arrangements/Services Living arrangements for the past 2 months: Single Family Home Lives with:: Adult Children Patient language and need for interpreter reviewed:: Yes Do you feel safe going back to the place where you live?: Yes      Need for Family Participation in Patient Care: No (Comment) Care giver support system in place?: Yes (comment)   Criminal Activity/Legal Involvement Pertinent to Current Situation/Hospitalization: No - Comment as needed  Activities of Daily Living Home Assistive Devices/Equipment: Cane (specify quad or straight), Walker (specify type) ADL Screening (condition at time of admission) Patient's cognitive ability adequate to safely complete daily activities?: Yes Is the patient deaf or have difficulty hearing?: No Does the patient have difficulty seeing, even when wearing glasses/contacts?: No Does the patient have difficulty concentrating, remembering, or making decisions?: No Patient able to express need for assistance with ADLs?: Yes Does the patient have difficulty dressing or bathing?:  Yes Independently performs ADLs?: No Dressing (OT): Needs assistance Is this a change from baseline?: Pre-admission baseline Bathing: Needs assistance Is this a change from baseline?: Pre-admission baseline Walks in Home: Independent with device (comment)(walker and cane) Does the patient have difficulty walking or climbing stairs?: Yes Weakness of Legs: None Weakness of Arms/Hands: None  Permission Sought/Granted Permission sought to share information with : Case Manager Permission granted to share information with : Yes, Verbal Permission Granted  Share Information with NAME: Case manager     Permission granted to share info w Relationship: Daughter Sallyanne Kuster [phone]     Emotional Assessment Appearance:: Appears stated age Attitude/Demeanor/Rapport: Gracious Affect (typically observed): Accepting Orientation: : Oriented to Self, Oriented to Place, Oriented to  Time, Oriented to Situation Alcohol / Substance Use: Not Applicable Psych Involvement: No (comment)  Admission diagnosis:  COPD (chronic obstructive pulmonary disease) (HCC) [J44.9] Respiratory failure (HCC) [J96.90] COPD with acute exacerbation (HCC) [J44.1] Acute on chronic respiratory failure with hypoxia and hypercapnia (HCC) [id number], J96.22] Patient Active Problem List   Diagnosis Date Noted  . Odynophagia   . Leukocytosis   . Acute esophagitis   . Esophageal dysphagia   . Esophageal stenosis   . Respiratory failure ([place]) 09/09/2019  . COPD (chronic obstructive pulmonary disease) ([place]) 06/30/2019  . Acute on chronic respiratory failure with hypoxia and hypercapnia ([place]) 06/29/2019  . GERD (gastroesophageal reflux disease)   . Pancreatic insufficiency   . Nausea & vomiting 04/03/2019  .  Abdominal pain 04/03/2019  . Abnormal finding on urinalysis 04/03/2019  . Acute respiratory failure with hypoxia and hypercapnia ([place]) 04/02/2019  . COPD with acute exacerbation ([place]) 06/15/2018  . Essential  hypertension 06/15/2018  . Genetic testing 06/01/2018  . Gastrinoma   . Family history of melanoma   . Family history of brain cancer   . Hypokalemia 10/19/2011  . Severe protein-calorie malnutrition ([place]) 10/19/2011  . Weight loss 10/19/2011  . Generalized weakness 10/19/2011  . Dysphagia 10/19/2011  . Anodontia 10/19/2011   PCP:  System, Pcp Not In Pharmacy:   [facility], [place] [address] [place] [address] Phone: [phone] Fax: [phone]  [place], [place] - [place] [phone] [place] [address] Phone: [phone] Fax: [phone]     Social Determinants of Health (SDOH) Interventions    Readmission Risk Interventions No flowsheet data found.

## 2019-09-13 NOTE — Progress Notes (Signed)
Physical Therapy Treatment Patient Details Name: Claudia Wiley MRN: [id number] DOB: September 18, 1955 Today's Date: 09/13/2019    History of Present Illness 64 year old female with a history of esophageal stricture requiring dilation in the past- 12/2017, admitted with COPD exacerbation, cough, N/V, epigastric pain, and dysphagia.  Additionally, history of LUL lobectomy and gastrinoma. Had EGD 09/11/19    PT Comments    Pt making good progress today.  Only needing assistance of 1 and able to increase gait to 168' with RW and min A for RW with turns.  Needed min cues for safety.    Follow Up Recommendations  Home health PT;Supervision for mobility/OOB     Equipment Recommendations  None recommended by PT    Recommendations for Other Services       Precautions / Restrictions Precautions Precautions: Fall    Mobility  Bed Mobility   Bed Mobility: Supine to Sit     Supine to sit: Min guard;HOB elevated     General bed mobility comments: increased time  Transfers Overall transfer level: Needs assistance Equipment used: Rolling walker (2 wheeled) Transfers: Sit to/from Stand Sit to Stand: Min guard         General transfer comment: cues for safe hand placement  Ambulation/Gait Ambulation/Gait assistance: Min assist Gait Distance (Feet): 168 Feet Assistive device: Rolling walker (2 wheeled) Gait Pattern/deviations: Decreased stride length;Shuffle Gait velocity: decr   General Gait Details: cues for RW proximity and for RW with turns (pt uses rollator); 1 standing rest break   Stairs             Wheelchair Mobility    Modified Rankin (Stroke Patients Only)       Balance Overall balance assessment: Needs assistance Sitting-balance support: No upper extremity supported;Feet supported Sitting balance-Leahy Scale: Good     Standing balance support: Bilateral upper extremity supported Standing balance-Leahy Scale: Fair                               Cognition Arousal/Alertness: Awake/alert Behavior During Therapy: Flat affect Overall Cognitive Status: Within Functional Limits for tasks assessed                                        Exercises      General Comments General comments (skin integrity, edema, etc.): Pt on 2 LPM O2 with sats 98%; does have wheezing (chronic)      Pertinent Vitals/Pain Pain Assessment: No/denies pain    Home Living                      Prior Function            PT Goals (current goals can now be found in the care plan section) Progress towards PT goals: Progressing toward goals    Frequency    Min 3X/week      PT Plan Current plan remains appropriate    Co-evaluation              AM-PAC PT "6 Clicks" Mobility   Outcome Measure  Help needed turning from your back to your side while in a flat bed without using bedrails?: None Help needed moving from lying on your back to sitting on the side of a flat bed without using bedrails?: None Help needed moving to and from a bed to a chair (  including a wheelchair)?: None Help needed standing up from a chair using your arms (e.g., wheelchair or bedside chair)?: None Help needed to walk in hospital room?: A Little Help needed climbing 3-5 steps with a railing? : A Little 6 Click Score: 22    End of Session Equipment Utilized During Treatment: Gait belt;Oxygen Activity Tolerance: Patient tolerated treatment well Patient left: with call bell/phone within reach;in bed;with bed alarm set Nurse Communication: Mobility status PT Visit Diagnosis: Unsteadiness on feet (R26.81);Muscle weakness (generalized) (M62.81);Difficulty in walking, not elsewhere classified (R26.2)     Time: [id number] PT Time Calculation (min) (ACUTE ONLY): 20 min  Charges:  $Gait Training: 8-22 mins                     Maggie Font, PT Acute Rehab Services Pager [phone] [facility]  [phone]    Claudia Wiley 09/13/2019, 5:10 PM

## 2019-09-13 NOTE — Progress Notes (Addendum)
    Progress Note   Subjective  Chief Complaint: COPD exacerbation, epigastric pain, nausea and vomiting  Status post EGD 09/11/2019: Severe mucosal changes in the entire esophagus with circumferential black appearing mucosa suspicious for acute esophageal necrosis, transition to normal tissue at the GE junction, nonbleeding duodenal ulcers-biopsies pending  Today, the patient tells me that she is hurting, she tried to eat Jell-O yesterday but this just "hurt really bad" and "I do not like Jell-O".  Per nursing staff she has not been doing well at all, she is somewhat tearful at time of exam this morning.  Apparently just received some pain meds.   Objective   Vital signs in last 24 hours: Temp:  [97.7 F (36.5 C)-98.5 F (36.9 C)] 98.4 F (36.9 C) (03/01 0625) Pulse Rate:  [90-114] 93 (03/01 0625) Resp:  [12-20] 20 (03/01 0625) BP: (103-137)/(62-91) 126/73 (03/01 0625) SpO2:  [96 %-100 %] 96 % (03/01 0805) Last BM Date: (PTA) General:    African-American female in NAD Heart:  Regular rate and rhythm; no murmurs Lungs: Respirations even and unlabored, lungs CTA bilaterally Abdomen:  Soft, moderate epigastric TTP and nondistended. Normal bowel sounds. Extremities:  Without edema. Neurologic:  Alert and oriented,  grossly normal neurologically. Psych:  Cooperative. Normal mood and affect.  Intake/Output from previous day: 02/28 0701 - 03/01 0700 In: 2885.8 [P.O.:237; I.V.:1982.6; IV Piggyback:666.2] Out: 600 [Urine:600]  Lab Results: Recent Labs    09/12/19 0325 09/13/19 0517  WBC 16.7* 11.4*  HGB 11.1* 12.0  HCT 36.3 39.4  PLT 226 226   BMET Recent Labs    09/12/19 0325 09/13/19 0517  NA 141 139  K 4.4 3.6  CL 108 107  CO2 28 28  GLUCOSE 65* 104*  BUN 24* 11  CREATININE 0.40* 0.34*  CALCIUM 7.8* 7.9*   LFT Recent Labs    09/13/19 0517  PROT 5.2*  ALBUMIN 2.6*  AST 25  ALT 17  ALKPHOS 60  BILITOT 0.2*     Assessment / Plan:   Assessment: 1.   Severe esophagitis: Seen on EGD 09/11/2019 with black appearing mucosa of the entire esophagus concerning for necrosis, biopsies pending, continues with severe pain, really unable to tolerate clears at this moment 2.  COPD exacerbation: Improving 3.  Duodenal ulcers 4.  Chronic pain syndrome 5.  Significant leukocytosis: Improving-likely secondary to esophageal process  Recommendations: 1.  Continue IV PPI twice daily 2.  Continue Carafate suspension 4 times daily 3.  Continue viscous lidocaine 15 cc 30 minutes before meals 4.  Tried the patient on Ensure today to see if she is able to tolerate this, again if not doing well may need to consider alternate nutrition 5.  Will need EGD as an outpatient in approximately 8 to 10 weeks to assess for mucosal healing 6.  Please await further recommendations from Dr. Fuller Plan later today  Thank you for kind consultation, we will continue to follow.   LOS: 4 days   Levin Erp  09/13/2019, 10:06 AM     Attending Physician Note   I have taken an interval history, reviewed the chart and examined the patient. I agree with the Advanced Practitioner's note, impression and recommendations. Carefully try several liquids, full liquids avoiding very cold and very hot options.   Lucio Edward, MD [facility]

## 2019-09-13 NOTE — Plan of Care (Signed)
Able to wean patient down to 1 liter of oxygen, remains short of breath with exertion.  Patient able to tolerate small amounts of liquids including some Ensure, continues to have significant pain with swallowing.  Daughter at bedside most of shift.

## 2019-09-14 ENCOUNTER — Other Ambulatory Visit: Payer: Self-pay

## 2019-09-14 DIAGNOSIS — K269 Duodenal ulcer, unspecified as acute or chronic, without hemorrhage or perforation: Secondary | ICD-10-CM

## 2019-09-14 LAB — COMPREHENSIVE METABOLIC PANEL
ALT: 18 U/L (ref 0–44)
AST: 20 U/L (ref 15–41)
Albumin: 2.3 g/dL — ABNORMAL LOW (ref 3.5–5.0)
Alkaline Phosphatase: 47 U/L (ref 38–126)
Anion gap: 5 (ref 5–15)
BUN: 8 mg/dL (ref 8–23)
CO2: 34 mmol/L — ABNORMAL HIGH (ref 22–32)
Calcium: 8 mg/dL — ABNORMAL LOW (ref 8.9–10.3)
Chloride: 102 mmol/L (ref 98–111)
Creatinine, Ser: <0.3 mg/dL — ABNORMAL LOW (ref 0.44–1.00)
Glucose, Bld: 88 mg/dL (ref 70–99)
Potassium: 4 mmol/L (ref 3.5–5.1)
Sodium: 141 mmol/L (ref 135–145)
Total Bilirubin: 0.3 mg/dL (ref 0.3–1.2)
Total Protein: 4.4 g/dL — ABNORMAL LOW (ref 6.5–8.1)

## 2019-09-14 LAB — CBC
HCT: 34.2 % — ABNORMAL LOW (ref 36.0–46.0)
Hemoglobin: 10.6 g/dL — ABNORMAL LOW (ref 12.0–15.0)
MCH: 28.8 pg (ref 26.0–34.0)
MCHC: 31 g/dL (ref 30.0–36.0)
MCV: 92.9 fL (ref 80.0–100.0)
Platelets: 227 10*3/uL (ref 150–400)
RBC: 3.68 MIL/uL — ABNORMAL LOW (ref 3.87–5.11)
RDW: 15.5 % (ref 11.5–15.5)
WBC: 12.2 10*3/uL — ABNORMAL HIGH (ref 4.0–10.5)
nRBC: 0 % (ref 0.0–0.2)

## 2019-09-14 MED ORDER — HYDROMORPHONE HCL 1 MG/ML IJ SOLN
0.2500 mg | INTRAMUSCULAR | Status: DC | PRN
  Administered 2019-09-14 – 2019-09-16 (×3): 0.25 mg via INTRAVENOUS
  Filled 2019-09-14 (×3): qty 0.5

## 2019-09-14 MED ORDER — HYDROMORPHONE HCL 1 MG/ML IJ SOLN: 0.5000 mg | Freq: Four times a day (QID) | INTRAMUSCULAR | Status: DC | PRN

## 2019-09-14 MED ORDER — SENNA 8.6 MG PO TABS
2.0000 | ORAL_TABLET | Freq: Every day | ORAL | Status: DC
  Administered 2019-09-14 – 2019-09-18 (×4): 17.2 mg via ORAL
  Filled 2019-09-14 (×5): qty 2

## 2019-09-14 MED ORDER — DOCUSATE SODIUM 100 MG PO CAPS
200.0000 mg | ORAL_CAPSULE | Freq: Two times a day (BID) | ORAL | Status: DC
  Administered 2019-09-14 – 2019-09-19 (×10): 200 mg via ORAL
  Filled 2019-09-14 (×11): qty 2

## 2019-09-14 MED ORDER — OXYCODONE HCL 5 MG PO TABS
5.0000 mg | ORAL_TABLET | Freq: Two times a day (BID) | ORAL | Status: DC
  Administered 2019-09-14 – 2019-09-19 (×10): 5 mg via ORAL
  Filled 2019-09-14 (×11): qty 1

## 2019-09-14 MED ORDER — FUROSEMIDE 10 MG/ML IJ SOLN
20.0000 mg | Freq: Once | INTRAMUSCULAR | Status: AC
  Administered 2019-09-14: 20 mg via INTRAVENOUS
  Filled 2019-09-14: qty 2

## 2019-09-14 NOTE — Progress Notes (Signed)
Occupational Therapy Treatment Patient Details Name: Claudia Wiley MRN: [id number] DOB: 1956-02-19 Today's Date: 09/14/2019    History of present illness 64 year old female with a history of esophageal stricture requiring dilation in the past- 12/2017, admitted with COPD exacerbation, cough, N/V, epigastric pain, and dysphagia.  Additionally, history of LUL lobectomy and gastrinoma. Had EGD 09/11/19   OT comments  Pt agreeable to working with level 2 theraband for strengthening. She reports that she has used this in the past at home.  Pt has been using 3:1 commode here and was independent with standard commode at home. Would likely  benefit from 3:1 over toilet  Follow Up Recommendations  Home health OT;Supervision/Assistance - 24 hour;Other (comment)    Equipment Recommendations  3 in 1 bedside commode    Recommendations for Other Services      Precautions / Restrictions Precautions Precautions: Fall Precaution Comments: wheezing--sats 97-100% on 2 liters at rest and 3 liters with mobility  On 2 liters 02 today     Mobility Bed Mobility                  Transfers                      Balance                                           ADL either performed or assessed with clinical judgement   ADL                                         General ADL Comments: pt had recently been up to use bathroom and is waiting for daughter for adls; daughter assists her at baseline.  Pt has been using 3:1 commode in the hospital. She may want this to go over her toilet at home.  Pt was previously independent with toileting     Vision       Perception     Praxis      Cognition Arousal/Alertness: Awake/alert Behavior During Therapy: Flat affect Overall Cognitive Status: No family/caregiver present to determine baseline cognitive functioning                                 General Comments: pt not oriented to month:  no  calendar in room        Exercises Exercises: Other exercises Other Exercises Other Exercises: issued level 2 theraband. Performed horizontal abduction and elbow flexion for each arm x 10.   Shoulder Instructions       General Comments      Pertinent Vitals/ Pain       Pain Assessment: 0-10 Faces Pain Scale: Hurts whole lot Pain Location: throat Pain Descriptors / Indicators: Aching Pain Intervention(s): Limited activity within patient's tolerance;Monitored during session;Patient requesting pain meds-RN notified  Home Living                                          Prior Functioning/Environment              Frequency  Min 2X/week  Progress Toward Goals  OT Goals(current goals can now be found in the care plan section)  Progress towards OT goals: Progressing toward goals     Plan      Co-evaluation                 AM-PAC OT "6 Clicks" Daily Activity     Outcome Measure   Help from another person eating meals?: A Little Help from another person taking care of personal grooming?: A Little Help from another person toileting, which includes using toliet, bedpan, or urinal?: A Lot Help from another person bathing (including washing, rinsing, drying)?: A Lot Help from another person to put on and taking off regular upper body clothing?: A Lot Help from another person to put on and taking off regular lower body clothing?: A Lot 6 Click Score: 14    End of Session    OT Visit Diagnosis: Unsteadiness on feet (R26.81);Other abnormalities of gait and mobility (R26.89);Muscle weakness (generalized) (M62.81)   Activity Tolerance Patient limited by pain   Patient Left in bed;with call bell/phone within reach;with bed alarm set   Nurse Communication          Time: [phone] OT Time Calculation (min): 15 min  Charges: OT General Charges $OT Visit: 1 Visit OT Treatments $Therapeutic Exercise: 8-22 mins  Leoncio Hansen S,  OTR/L Acute Rehabilitation Services 09/14/2019   Laycie Schriner 09/14/2019, 10:22 AM

## 2019-09-14 NOTE — Progress Notes (Signed)
PROGRESS NOTE    Claudia Wiley  [id number] DOB: 06/25/1956 DOA: 09/09/2019 PCP: System, Pcp Not In  Brief Narrative:64 year old black female with history of esophageal stricture and dilatation 01/2018, gastrinoma status post Whipple's procedure, depression admitted with intractable nausea and vomiting. History mainly obtained from the patient's daughter. Per daughter she has not been able to eat anything or drink anything she continues to vomit. She also has severe abdominal pain denies diarrhea. Family moved here in 20[address]from [place] where she had surgery on her stomach for gastrinoma which was removed. She has a history of lobectomy of the left upper lobe due to lung cancer.   Assessment & Plan:   Active Problems:   COPD with acute exacerbation (HCC)   Respiratory failure (HCC)   Acute esophagitis   Esophageal dysphagia   Esophageal stenosis   Odynophagia   Leukocytosis   #1esophagitis with EGD findings suspicious for acute esophageal necrosis and nonbleeding duodenal ulcers Biopsies pending. CT abdomen and pelvis shows-fluid-filled distal esophagus with submucosal edema and inflammation along the distal esophagus consistent with esophagitis. Moderate volume stool in the ascending colon and proximal transverse colon. No obstruction identified. egd2/27/2021-profound mucosal changes in the entire esophagus black appearing mucosa suspicious for acute esophageal necrosis. Biopsies done. Nonbleeding duodenal ulcers with no stigmata of bleeding biopsied. Patient is tolerating sips of water with pills and Ensure. Will defer to GI for advancing her diet. Continue sucralfate and Protonix. Continue cefepime and Flagyl started 09/10/2019 Positive by [id number] -gave one dose of lasix 3/2  #2acute hypoxic and hypercarbic respiratory failure secondary to COPD exacerbation.  Was on tapering dose of steroids she finished her course of prednisone yesterday. Chest x-ray clear other than  findings of COPD. Continue duo nebs. Continue Spiriva. Check ambulatory oxygen saturation patient did not have oxygen prior to admission to hospital.  #3anxiety with depression Xanax and Elavil and Remeron  #4chronic pain with acute pain is secondary to acute esophageal necrosis-continue Dilaudid the dose was increased today 0.25 every 4 as needed, oxycodone added today for longer pain control.  Discussed with the daughter.    #5leukocytosis resolving 12.2 down from 36.7 on admission.  Likely secondary to esophageal necrosis on cefepime and Flagyl.  #6 history of essential hypertension blood pressure 108/75 continue to hold Norvasc.   #7 increasing edema with over 7 L of fluid retention and low albumin-DC IV fluids Lasix 1 dose 20 mg x 1 given.  Nutrition Problem: Inadequate oral intake Etiology: acute illness, nausea, vomiting     Signs/Symptoms: per patient/family report, NPO status    Interventions: Refer to RD note for recommendations  Estimated body mass index is 18.48 kg/m as calculated from the following:   Height as of this encounter: 4\' 11"  (1.499 m).   Weight as of this encounter: 41.5 kg.  DVT prophylaxis:Lovenox Code Status:Full code Family Communication:Discussed with patient's daughter Disposition Plan:Patient came from home.  Patient will be discharged home once she is medically ready to be discharged.  Barriers to discharge patient still on clear liquids.  Need to tolerate a diet prior to discharge.  Patient has acute esophageal necrosis.  She also requires IV narcotics for pain control.  Consultants:  GI  Procedures:None Antimicrobials:Cefepime and Flagyl to cover intra-abdominal pathology findings of acute esophageal necrosis  Subjective: She is resting in bed much more comfortable than yesterday drank some Ensure today after taking sucralfate Dilaudid.  Objective: Vitals:   09/14/19 0741 09/14/19 0811 09/14/19 1249 09/14/19 1339  BP:   124/74 108/75  Pulse:  91 (!) 105   Resp:   18   Temp:  98 F (36.7 C) 98.4 F (36.9 C)   TempSrc:  Oral Oral   SpO2: 99% 100% 98% 96%  Weight:      Height:        Intake/Output Summary (Last 24 hours) at 09/14/2019 1343 Last data filed at 09/14/2019 0900 Gross per 24 hour  Intake 2163.65 ml  Output --  Net 2163.65 ml   Filed Weights   09/09/19 2300 09/11/19 0658 09/12/19 0400  Weight: 37.7 kg 37.7 kg 41.5 kg    Examination:  General exam: Appears calm and comfortable  Respiratory system: Few scattered rhonchi to auscultation. Respiratory effort normal. Cardiovascular system: S1 & S2 heard, RRR. No JVD, murmurs, rubs, gallops or clicks. No pedal edema. Gastrointestinal system: Abdomen is nondistended, soft and nontender. No organomegaly or masses felt. Normal bowel sounds heard. Central nervous system: Alert and oriented. No focal neurological deficits. Extremities: Symmetric 5 x 5 power. Skin: No rashes, lesions or ulcers Psychiatry: Judgement and insight appear normal. Mood & affect appropriate.     Data Reviewed: I have personally reviewed following labs and imaging studies  CBC: Recent Labs  Lab 09/09/19 1637 09/10/19 0215 09/12/19 0325 09/13/19 0517 09/14/19 0418  WBC 31.9* 36.7* 16.7* 11.4* 12.2*  NEUTROABS 29.2*  --   --   --   --   HGB 15.6* 13.8 11.1* 12.0 10.6*  HCT 49.7* 43.7 36.3 39.4 34.2*  MCV 91.7 91.4 93.8 93.1 92.9  PLT 247 253 226 226 [id number]   Basic Metabolic Panel: Recent Labs  Lab 09/09/19 1507 09/10/19 0215 09/12/19 0325 09/13/19 0517 09/13/19 0700 09/14/19 0418  NA 142 138 141 139  --  141  K 5.5* 4.6 4.4 3.6  --  4.0  CL 92* 98 108 107  --  102  CO2 34* 29 28 28   --  34*  GLUCOSE 90 155* 65* 104*  --  88  BUN 37* 28* 24* 11  --  8  CREATININE 0.94 0.55 0.40* 0.34*  --  <0.30*  CALCIUM 9.7 8.3* 7.8* 7.9*  --  8.0*  MG  --   --   --   --  2.2  --    GFR: CrCl cannot be calculated (This lab value cannot be used to calculate CrCl  because it is not a number: <0.30). Liver Function Tests: Recent Labs  Lab 09/10/19 0215 09/12/19 0325 09/13/19 0517 09/14/19 0418  AST 22 29 25 20   ALT 17 18 17 18   ALKPHOS 82 59 60 47  BILITOT 0.4 0.6 0.2* 0.3  PROT 6.6 5.3* 5.2* 4.4*  ALBUMIN 3.5 2.7* 2.6* 2.3*   No results for input(s): LIPASE, AMYLASE in the last 168 hours. No results for input(s): AMMONIA in the last 168 hours. Coagulation Profile: No results for input(s): INR, PROTIME in the last 168 hours. Cardiac Enzymes: No results for input(s): CKTOTAL, CKMB, CKMBINDEX, TROPONINI in the last 168 hours. BNP (last 3 results) No results for input(s): PROBNP in the last 8760 hours. HbA1C: No results for input(s): HGBA1C in the last 72 hours. CBG: No results for input(s): GLUCAP in the last 168 hours. Lipid Profile: No results for input(s): CHOL, HDL, LDLCALC, TRIG, CHOLHDL, LDLDIRECT in the last 72 hours. Thyroid Function Tests: No results for input(s): TSH, T4TOTAL, FREET4, T3FREE, THYROIDAB in the last 72 hours. Anemia Panel: No results for input(s): VITAMINB12, FOLATE, FERRITIN, TIBC, IRON, RETICCTPCT in the last 72  hours. Sepsis Labs: No results for input(s): PROCALCITON, LATICACIDVEN in the last 168 hours.  Recent Results (from the past 240 hour(s))  Respiratory Panel by RT PCR (Flu A&B, Covid) - Nasopharyngeal Swab     Status: None   Collection Time: 09/09/19  3:08 PM   Specimen: Nasopharyngeal Swab  Result Value Ref Range Status   SARS Coronavirus 2 by RT PCR NEGATIVE NEGATIVE Final    Comment: (NOTE) SARS-CoV-2 target nucleic acids are NOT DETECTED. The SARS-CoV-2 RNA is generally detectable in upper respiratoy specimens during the acute phase of infection. The lowest concentration of SARS-CoV-2 viral copies this assay can detect is 131 copies/mL. A negative result does not preclude SARS-Cov-2 infection and should not be used as the sole basis for treatment or other patient management decisions. A  negative result may occur with  improper specimen collection/handling, submission of specimen other than nasopharyngeal swab, presence of viral mutation(s) within the areas targeted by this assay, and inadequate number of viral copies (<131 copies/mL). A negative result must be combined with clinical observations, patient history, and epidemiological information. The expected result is Negative. Fact Sheet for Patients:  PinkCheek.be Fact Sheet for Healthcare Providers:  GravelBags.it This test is not yet ap proved or cleared by the [place] FDA and  has been authorized for detection and/or diagnosis of SARS-CoV-2 by FDA under an Emergency Use Authorization (EUA). This EUA will remain  in effect (meaning this test can be used) for the duration of the COVID-19 declaration under Section 564(b)(1) of the Act, 21 U.S.C. section 360bbb-3(b)(1), unless the authorization is terminated or revoked sooner.    Influenza A by PCR NEGATIVE NEGATIVE Final   Influenza B by PCR NEGATIVE NEGATIVE Final    Comment: (NOTE) The Xpert Xpress SARS-CoV-2/FLU/RSV assay is intended as an aid in  the diagnosis of influenza from Nasopharyngeal swab specimens and  should not be used as a sole basis for treatment. Nasal washings and  aspirates are unacceptable for Xpert Xpress SARS-CoV-2/FLU/RSV  testing. Fact Sheet for Patients: PinkCheek.be Fact Sheet for Healthcare Providers: GravelBags.it This test is not yet approved or cleared by the [place] FDA and  has been authorized for detection and/or diagnosis of SARS-CoV-2 by  FDA under an Emergency Use Authorization (EUA). This EUA will remain  in effect (meaning this test can be used) for the duration of the  Covid-19 declaration under Section 564(b)(1) of the Act, 21  U.S.C. section 360bbb-3(b)(1), unless the authorization is    terminated or revoked. Performed at [facility], [place] [address], [place], [place] [address]   MRSA PCR Screening     Status: None   Collection Time: 09/09/19  9:22 PM   Specimen: Nasopharyngeal  Result Value Ref Range Status   MRSA by PCR NEGATIVE NEGATIVE Final    Comment:        The GeneXpert MRSA Assay (FDA approved for NASAL specimens only), is one component of a comprehensive MRSA colonization surveillance program. It is not intended to diagnose MRSA infection nor to guide or monitor treatment for MRSA infections. Performed at [facility], [place] [address], [place], [place] [address]          Radiology Studies: No results found.      Scheduled Meds: . amitriptyline  10 mg Oral QHS  . Chlorhexidine Gluconate Cloth  6 each Topical Daily  . fluticasone  2 spray Each Nare Daily  . guaiFENesin  600 mg Oral BID  . ipratropium-albuterol  3 mL Nebulization Once  . ipratropium-albuterol  3 mL Nebulization TID  . lidocaine  15 mL Mouth/Throat TID AC  . lipase/protease/amylase  24,000 Units Oral TID WC & HS  . mouth rinse  15 mL Mouth Rinse BID  . mirtazapine  15 mg Oral QHS  . pantoprazole (PROTONIX) IV  40 mg Intravenous Q12H  . predniSONE  40 mg Oral Q breakfast  . sucralfate  1 g Oral Q6H   Continuous Infusions: . sodium chloride 100 mL/hr at 09/14/19 0832  . metronidazole 500 mg (09/14/19 0516)     LOS: 5 days    Georgette Shell, MD  09/14/2019, 1:43 PM

## 2019-09-14 NOTE — Progress Notes (Signed)
    Progress Note   Subjective  Pain with swallowing persist however it has improved. Able to tolerate more orally.    Objective  Vital signs in last 24 hours: Temp:  [98 F (36.7 C)-98.5 F (36.9 C)] 98 F (36.7 C) (03/02 0811) Pulse Rate:  [91-109] 91 (03/02 0811) Resp:  [18-20] 18 (03/01 1945) BP: (120-131)/(74-90) 124/74 (03/02 0811) SpO2:  [93 %-100 %] 100 % (03/02 0811) Last BM Date: 09/13/19(per pt)  General: Alert, thin, chronically ill appearing, uncomfortable Heart:  Regular rate and rhythm; no murmurs Chest: Clear to ascultation bilaterally Abdomen:  Soft, nontender and nondistended. Normal bowel sounds, without guarding, and without rebound.   Extremities:  Without edema. Neurologic:  Alert and  oriented x4; grossly normal neurologically. Psych:  Alert and cooperative. Normal mood and affect.  Intake/Output from previous day: 03/01 0701 - 03/02 0700 In: 2835.9 [P.O.:360; I.V.:1853.2; IV Piggyback:622.6] Out: -  Intake/Output this shift: No intake/output data recorded.  Lab Results: Recent Labs    09/12/19 0325 09/13/19 0517 09/14/19 0418  WBC 16.7* 11.4* 12.2*  HGB 11.1* 12.0 10.6*  HCT 36.3 39.4 34.2*  PLT 226 226 227   BMET Recent Labs    09/12/19 0325 09/13/19 0517 09/14/19 0418  NA 141 139 141  K 4.4 3.6 4.0  CL 108 107 102  CO2 28 28 34*  GLUCOSE 65* 104* 88  BUN 24* 11 8  CREATININE 0.40* 0.34* <0.30*  CALCIUM 7.8* 7.9* 8.0*   LFT Recent Labs    09/14/19 0418  PROT 4.4*  ALBUMIN 2.3*  AST 20  ALT 18  ALKPHOS 47  BILITOT 0.3      Assessment & Recommendations   1. Severe esophagitis. Odynophagia slightly improved. Anticipate slow progress.   Esophageal biopsies are pending. Continue pantoprazole bid, sucralfate qid, viscous lidocaine ac. Clear liquids and advance slowly to full liquids as tolerated.   2. Duodenal ulcers. Gastric biopsies pending. Continue pantoprazole bid.   3. COPD exacerbation.     LOS: 5 days    Lashonda Sonneborn T. Fuller Plan MD 09/14/2019, 9:11 AM

## 2019-09-14 NOTE — TOC Progression Note (Signed)
Transition of Care [facility]) - Progression Note    Patient Details  Name: Mishel Ruise MRN: [id number] Date of Birth: 12/10/1955  Transition of Care [facility]) CM/SW Contact  Almando Brawley, Juliann Pulse, RN Phone Number: 09/14/2019, 12:17 PM  Clinical Narrative:  [facility] await call back in reference to HHPT/OT-[facility],3n1,fmla for dtr [name]-await call back.    Expected Discharge Plan: [place] Barriers to Discharge: Continued Medical Work up  Expected Discharge Plan and Services Expected Discharge Plan: [place]   Discharge Planning Services: CM Consult   Living arrangements for the past 2 months: Single Family Home                                       Social Determinants of Health (SDOH) Interventions    Readmission Risk Interventions No flowsheet data found.

## 2019-09-14 NOTE — Progress Notes (Signed)
PT Cancellation Note  Patient Details Name: Claudia Wiley MRN: [id number] DOB: [birth year]   Cancelled Treatment:    Reason Eval/Treat Not Completed: Fatigue/lethargy limiting ability to participate Pt politely declined, requesting to rest.   York Ram E 09/14/2019, 2:21 PM [name], DPT Acute Rehabilitation Services Office: [phone]

## 2019-09-15 ENCOUNTER — Other Ambulatory Visit: Payer: Self-pay

## 2019-09-15 DIAGNOSIS — E8770 Fluid overload, unspecified: Secondary | ICD-10-CM

## 2019-09-15 LAB — CBC
HCT: 36.5 % (ref 36.0–46.0)
Hemoglobin: 11.3 g/dL — ABNORMAL LOW (ref 12.0–15.0)
MCH: 28.5 pg (ref 26.0–34.0)
MCHC: 31 g/dL (ref 30.0–36.0)
MCV: 91.9 fL (ref 80.0–100.0)
Platelets: 278 10*3/uL (ref 150–400)
RBC: 3.97 MIL/uL (ref 3.87–5.11)
RDW: 15.3 % (ref 11.5–15.5)
WBC: 10.1 10*3/uL (ref 4.0–10.5)
nRBC: 0 % (ref 0.0–0.2)

## 2019-09-15 LAB — COMPREHENSIVE METABOLIC PANEL
ALT: 20 U/L (ref 0–44)
AST: 22 U/L (ref 15–41)
Albumin: 2.5 g/dL — ABNORMAL LOW (ref 3.5–5.0)
Alkaline Phosphatase: 55 U/L (ref 38–126)
Anion gap: 5 (ref 5–15)
BUN: 10 mg/dL (ref 8–23)
CO2: 38 mmol/L — ABNORMAL HIGH (ref 22–32)
Calcium: 8.3 mg/dL — ABNORMAL LOW (ref 8.9–10.3)
Chloride: 98 mmol/L (ref 98–111)
Creatinine, Ser: 0.31 mg/dL — ABNORMAL LOW (ref 0.44–1.00)
GFR calc Af Amer: >60 mL/min (ref >60)
GFR calc non Af Amer: >60 mL/min (ref >60)
Glucose, Bld: 75 mg/dL (ref 70–99)
Potassium: 3.3 mmol/L — ABNORMAL LOW (ref 3.5–5.1)
Sodium: 141 mmol/L (ref 135–145)
Total Bilirubin: 0.3 mg/dL (ref 0.3–1.2)
Total Protein: 4.9 g/dL — ABNORMAL LOW (ref 6.5–8.1)

## 2019-09-15 LAB — SURGICAL PATHOLOGY

## 2019-09-15 MED ORDER — FLUCONAZOLE IN SODIUM CHLORIDE 200-0.9 MG/100ML-% IV SOLN
200.0000 mg | Freq: Every day | INTRAVENOUS | Status: DC
  Administered 2019-09-15 – 2019-09-18 (×4): 200 mg via INTRAVENOUS
  Filled 2019-09-15 (×4): qty 100

## 2019-09-15 MED ORDER — FUROSEMIDE 10 MG/ML IJ SOLN
40.0000 mg | Freq: Once | INTRAMUSCULAR | Status: AC
  Administered 2019-09-15: 40 mg via INTRAVENOUS
  Filled 2019-09-15: qty 4

## 2019-09-15 MED ORDER — ACETAMINOPHEN 325 MG PO TABS
325.0000 mg | ORAL_TABLET | ORAL | Status: DC | PRN
  Administered 2019-09-15 – 2019-09-18 (×5): 325 mg via ORAL
  Filled 2019-09-15 (×5): qty 1

## 2019-09-15 MED ORDER — POTASSIUM CHLORIDE 20 MEQ/15ML (10%) PO SOLN: 20.0000 meq | Freq: Once | ORAL | Status: DC

## 2019-09-15 MED ORDER — POTASSIUM CHLORIDE CRYS ER 20 MEQ PO TBCR
20.0000 meq | EXTENDED_RELEASE_TABLET | Freq: Once | ORAL | Status: AC
  Administered 2019-09-15: 20 meq via ORAL
  Filled 2019-09-15: qty 1

## 2019-09-15 MED ORDER — ENOXAPARIN SODIUM 300 MG/3ML IJ SOLN
20.0000 mg | INTRAMUSCULAR | Status: DC
  Administered 2019-09-15 – 2019-09-18 (×4): 20 mg via SUBCUTANEOUS
  Filled 2019-09-15 (×6): qty 0.2

## 2019-09-15 MED ORDER — FUROSEMIDE 10 MG/ML IJ SOLN
40.0000 mg | Freq: Two times a day (BID) | INTRAMUSCULAR | Status: DC
  Administered 2019-09-15: 40 mg via INTRAVENOUS
  Filled 2019-09-15: qty 4

## 2019-09-15 NOTE — Progress Notes (Signed)
Ok to reduce prophylaxis lovenox to 20mg  SQ q24 due to low body weight per Dr. Neysa Bonito.  Onnie Boer, PharmD, BCIDP, AAHIVP, CPP Infectious Disease Pharmacist 09/15/2019 4:27 PM

## 2019-09-15 NOTE — Progress Notes (Addendum)
     [facility] Progress Note  CC:  Odynophagia, severe esophagitis  Subjective:  Says that she is doing quite a bit better with her swallowing compared to when she came in.  Objective:  Vital signs in last 24 hours: Temp:  [98.2 F (36.8 C)-98.4 F (36.9 C)] 98.2 F (36.8 C) (03/03 0504) Pulse Rate:  [91-105] 91 (03/03 0504) Resp:  [18] 18 (03/03 0504) BP: (108-118)/(72-79) 118/79 (03/03 0504) SpO2:  [95 %-100 %] 95 % (03/03 0733) Last BM Date: 09/13/19 General:  Alert, thin and frail, in NAD Heart:  Regular rate and rhythm; no murmurs Pulm:  Expiratory wheezing noted B/L anteriorly. Abdomen:  Soft, non-distended.  BS present.  Non-tender. Extremities:  Without edema. Neurologic:  Alert and oriented x 4;  grossly normal neurologically. Psych:  Alert and cooperative. Normal mood and affect.  Intake/Output from previous day: 03/02 0701 - 03/03 0700 In: 600 [P.O.:600] Out: 1295 [Urine:1295]  Lab Results: Recent Labs    09/13/19 0517 09/14/19 0418 09/15/19 0429  WBC 11.4* 12.2* 10.1  HGB 12.0 10.6* 11.3*  HCT 39.4 34.2* 36.5  PLT 226 227 278   BMET Recent Labs    09/13/19 0517 09/14/19 0418 09/15/19 0429  NA 139 141 141  K 3.6 4.0 3.3*  CL 107 102 98  CO2 28 34* 38*  GLUCOSE 104* 88 75  BUN 11 8 10   CREATININE 0.34* <0.30* 0.31*  CALCIUM 7.9* 8.0* 8.3*   LFT Recent Labs    09/15/19 0429  PROT 4.9*  ALBUMIN 2.5*  AST 22  ALT 20  ALKPHOS 55  BILITOT 0.3   Assessment / Plan: 1. Severe esophagitis. Odynophagia slowly improving.  Esophageal biopsies show inflammatory exudate and necrotic debris with fungal organisms present, special stains begin performed to better characterize the fungi.  Continue pantoprazole bid, sucralfate qid, viscous lidocaine ac.  Clear liquids still for now.  2. Duodenal ulcers. Duodenal biopsies with erosion and Brunner gland hyperplasia.  Gastric biopsies with hyperemia and slight chronic inflammation, negative  for Hpylori. Continue pantoprazole bid.   3. COPD exacerbation, on chronic O2  -Await results of special stains to characterize fungi before starting any other regimen.  May need to have ID weigh in pending results.   LOS: 6 days   Laban Emperor. Zehr  09/15/2019, 10:00 AM    Attending Physician Note   I have taken an interval history, reviewed the chart and examined the patient. I agree with the Advanced Practitioner's note, impression and recommendations.   Lucio Edward, MD [facility]

## 2019-09-15 NOTE — Progress Notes (Signed)
Physical Therapy Treatment Patient Details Name: Claudia Wiley MRN: [id number] DOB: July 15, 1956 Today's Date: 09/15/2019    History of Present Illness 64 year old female with a history of esophageal stricture requiring dilation in the past- 12/2017, admitted with COPD exacerbation, cough, N/V, epigastric pain, and dysphagia.  Additionally, history of LUL lobectomy and gastrinoma. Had EGD 09/11/19    PT Comments    Pt declined ambulating earlier this morning however agreeable upon returning to room (daughter also present at that time).  Pt assisted with ambulating in hallway however fatigue and dyspnea limiting distance.    Follow Up Recommendations  Home health PT;Supervision for mobility/OOB     Equipment Recommendations  None recommended by PT    Recommendations for Other Services       Precautions / Restrictions Precautions Precautions: Fall Precaution Comments: currently on 2L O2 [place]    Mobility  Bed Mobility Overal bed mobility: Needs Assistance Bed Mobility: Supine to Sit     Supine to sit: Min guard;HOB elevated        Transfers Overall transfer level: Needs assistance Equipment used: None Transfers: Sit to/from Stand Sit to Stand: Min guard         General transfer comment: cues for safe hand placement; pt declined using RW and instead wanted to hold IV pole  Ambulation/Gait Ambulation/Gait assistance: Min guard Gait Distance (Feet): 100 Feet Assistive device: IV Pole Gait Pattern/deviations: Decreased stride length;Shuffle;Trunk flexed Gait velocity: decr   General Gait Details: reliant on bil UE holding IV pole, cues for posture, remained on 2L O2 [place] and declined standing rest break, SPO2 94% upon returning to room however pt reports 8/10 dyspnea   Stairs             Wheelchair Mobility    Modified Rankin (Stroke Patients Only)       Balance                                            Cognition Arousal/Alertness:  Awake/alert Behavior During Therapy: Flat affect Overall Cognitive Status: Within Functional Limits for tasks assessed                                 General Comments: daughter present, daughter did not ambulate with pt and upon return to room, pt upset and stating "I thought you left me" to daughter, pt also with 8/10 dyspnea after ambulating; requesting ?anxiety med - RN notified of events      Exercises      General Comments        Pertinent Vitals/Pain Pain Assessment: Faces Faces Pain Scale: Hurts even more Pain Location: throat Pain Descriptors / Indicators: Aching Pain Intervention(s): Repositioned;Monitored during session;Patient requesting pain meds-RN notified    Home Living                      Prior Function            PT Goals (current goals can now be found in the care plan section) Progress towards PT goals: Progressing toward goals    Frequency    Min 3X/week      PT Plan Current plan remains appropriate    Co-evaluation              AM-PAC PT "6 Clicks" Mobility  Outcome Measure  Help needed turning from your back to your side while in a flat bed without using bedrails?: None Help needed moving from lying on your back to sitting on the side of a flat bed without using bedrails?: None Help needed moving to and from a bed to a chair (including a wheelchair)?: None Help needed standing up from a chair using your arms (e.g., wheelchair or bedside chair)?: A Little Help needed to walk in hospital room?: A Little Help needed climbing 3-5 steps with a railing? : A Little 6 Click Score: 21    End of Session Equipment Utilized During Treatment: Oxygen(declined gait belt) Activity Tolerance: Patient tolerated treatment well Patient left: with call bell/phone within reach;in bed;with bed alarm set;with nursing/sitter in room Nurse Communication: Mobility status PT Visit Diagnosis: Unsteadiness on feet (R26.81);Difficulty  in walking, not elsewhere classified (R26.2)     Time: [id number] PT Time Calculation (min) (ACUTE ONLY): 15 min  Charges:  $Gait Training: 8-22 mins                    Claudia Wiley, DPT Acute Rehabilitation Services Office: [phone]   Claudia Wiley 09/15/2019, 2:56 PM

## 2019-09-15 NOTE — Progress Notes (Signed)
PROGRESS NOTE    Claudia Wiley    Code Status: Full Code  [id number] DOB: 25-Sep-1955 DOA: 09/09/2019 LOS: 6 days  PCP: System, Pcp Not In CC: No chief complaint on file.      Hospital Summary   64 year old female with history of esophageal stricture and dilatation 01/2018, gastrinoma status post Whipple's procedure, depression recently moved from [place] in 2018 who was admitted with intractable nausea and vomiting.  S/p EGD 2/27: Severe mucosal changes in the entire esophagus with circumferential plaque appearing mucosa suspicious for acute esophageal necrosis, transition to normal tissue at the GE junction, nonbleeding duodenal ulcers.  3/3 EGD culture growing unknown fungus and bacteria.  Special stains to further evaluate fungal infection  A & P   Active Problems:   COPD with acute exacerbation (HCC)   Respiratory failure (HCC)   Acute esophagitis   Esophageal dysphagia   Esophageal stenosis   Odynophagia   Leukocytosis   1. Severe esophagitis, odynophagia, slowly improving a. Esophageal biopsy showing inflammatory exudate and necrotic debris with fungal and bacterial organisms present, special stains being performed to better characterize infections b. On empiric Flagyl and received cefepime for COPD exacerbation c. Continue Protonix twice daily, sucralfate 4 times daily, viscous lidocaine, Fluconazole 200 mg daily continue clear liquid diet per GI 2. Duodenal ulcers a. Biopsy with erosion improving gland hyperplasia, gastric biopsy with hyperemia and slight chronic inflammation, negative for H. pylori b. Plan as above 3. Acute on chronic hypoxic and hypercarbic respiratory failure likely secondary to volume overload with possible COPD exacerbation a. No echo on file b. Weight steadily increasing from admission 36.3 kg-> 41.5 kg on 2/28, no weight today. + 6 L at least since admit c. Received large volumes of IV fluids as well as cefepime d. Hold further IV fluids now  that she is tolerating CLD e. Lasix 40 mg IV twice daily f. Continue DuoNebs g. Hold off on further cefepime h. Echo 4. Anxiety and depression a. Continue Xanax and Elavil 5. 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 Continue pain medications b. tylenol for headache 6. Leukocytosis resolved 7. Hypokalemia a. Give p.o.   DVT prophylaxis: Lovenox, SCDs Family Communication: No family at bedside Disposition Plan:   Patient came from:   Home                                                                                          Anticipated d/c place: Home with home health  Barriers to d/c: Volume status, O2 requirements and p.o. intake.  Hopeful discharge in 48 to 72 hours  Pressure injury documentation    None  Consultants  GI  Procedures  EGD 2/27  Antibiotics   Anti-infectives (From admission, onward)   Start     Dose/Rate Route Frequency Ordered Stop   09/15/19 1600  fluconazole (DIFLUCAN) IVPB 200 mg     200 mg 100 mL/hr over 60 Minutes Intravenous Daily 09/15/19 1447     09/10/19 1400  metroNIDAZOLE (FLAGYL) IVPB 500 mg     500 mg 100 mL/hr over 60 Minutes Intravenous Every 8 hours 09/10/19 1305  09/09/19 2200  ceFEPIme (MAXIPIME) 2 g in sodium chloride 0.9 % 100 mL IVPB     2 g 200 mL/hr over 30 Minutes Intravenous 2 times daily 09/09/19 2119 09/14/19 0905        Subjective   Admits to abdominal bloating and swelling with shortness of breath.  No overnight events.  No other complaints.  Objective   Vitals:   09/14/19 2010 09/15/19 0504 09/15/19 0733 09/15/19 1336  BP: 116/72 118/79  121/81  Pulse: 96 91  99  Resp: 18 18  15   Temp: 98.2 F (36.8 C) 98.2 F (36.8 C)  (!) 97.4 F (36.3 C)  TempSrc: Oral Oral  Oral  SpO2: 100% 100% 95% 98%  Weight:      Height:        Intake/Output Summary (Last 24 hours) at 09/15/2019 1603 Last data filed at 09/15/2019 1337 Gross per 24 hour  Intake 240 ml  Output 2100 ml  Net -1860  ml   Filed Weights   09/09/19 2300 09/11/19 0658 09/12/19 0400  Weight: 37.7 kg 37.7 kg 41.5 kg    Examination:  Physical Exam Vitals and nursing note reviewed.  Constitutional:      Appearance: She is not ill-appearing.  HENT:     Mouth/Throat:     Mouth: Mucous membranes are moist.  Eyes:     Conjunctiva/sclera: Conjunctivae normal.  Cardiovascular:     Rate and Rhythm: Normal rate and regular rhythm.  Pulmonary:     Breath sounds: Wheezing and rales present.     Comments: Diffuse expiratory wheeze Accessory muscle use Abdominal:     General: There is distension.  Musculoskeletal:     Comments: Pitting edema noted from ankles to posterior thigh  Neurological:     Mental Status: She is alert. Mental status is at baseline.  Psychiatric:        Mood and Affect: Mood normal.        Behavior: Behavior normal.     Data Reviewed: I have personally reviewed following labs and imaging studies  CBC: Recent Labs  Lab 09/09/19 1637 09/09/19 1637 09/10/19 0215 09/12/19 0325 09/13/19 0517 09/14/19 0418 09/15/19 0429  WBC 31.9*   < > 36.7* 16.7* 11.4* 12.2* 10.1  NEUTROABS 29.2*  --   --   --   --   --   --   HGB 15.6*   < > 13.8 11.1* 12.0 10.6* 11.3*  HCT 49.7*   < > 43.7 36.3 39.4 34.2* 36.5  MCV 91.7   < > 91.4 93.8 93.1 92.9 91.9  PLT 247   < > 253 226 226 227 278   < > = values in this interval not displayed.   Basic Metabolic Panel: Recent Labs  Lab 09/10/19 0215 09/12/19 0325 09/13/19 0517 09/13/19 0700 09/14/19 0418 09/15/19 0429  NA 138 141 139  --  141 141  K 4.6 4.4 3.6  --  4.0 3.3*  CL 98 108 107  --  102 98  CO2 29 28 28   --  34* 38*  GLUCOSE 155* 65* 104*  --  88 75  BUN 28* 24* 11  --  8 10  CREATININE 0.55 0.40* 0.34*  --  <0.30* 0.31*  CALCIUM 8.3* 7.8* 7.9*  --  8.0* 8.3*  MG  --   --   --  2.2  --   --    GFR: Estimated Creatinine Clearance: 48.4 mL/min (A) (by C-G formula based on SCr  of 0.31 mg/dL (L)). Liver Function Tests:  Recent Labs  Lab 09/10/19 0215 09/12/19 0325 09/13/19 0517 09/14/19 0418 09/15/19 0429  AST 22 29 25 20 22   ALT 17 18 17 18 20   ALKPHOS 82 59 60 47 55  BILITOT 0.4 0.6 0.2* 0.3 0.3  PROT 6.6 5.3* 5.2* 4.4* 4.9*  ALBUMIN 3.5 2.7* 2.6* 2.3* 2.5*   No results for input(s): LIPASE, AMYLASE in the last 168 hours. No results for input(s): AMMONIA in the last 168 hours. Coagulation Profile: No results for input(s): INR, PROTIME in the last 168 hours. Cardiac Enzymes: No results for input(s): CKTOTAL, CKMB, CKMBINDEX, TROPONINI in the last 168 hours. BNP (last 3 results) No results for input(s): PROBNP in the last 8760 hours. HbA1C: No results for input(s): HGBA1C in the last 72 hours. CBG: No results for input(s): GLUCAP in the last 168 hours. Lipid Profile: No results for input(s): CHOL, HDL, LDLCALC, TRIG, CHOLHDL, LDLDIRECT in the last 72 hours. Thyroid Function Tests: No results for input(s): TSH, T4TOTAL, FREET4, T3FREE, THYROIDAB in the last 72 hours. Anemia Panel: No results for input(s): VITAMINB12, FOLATE, FERRITIN, TIBC, IRON, RETICCTPCT in the last 72 hours. Sepsis Labs: No results for input(s): PROCALCITON, LATICACIDVEN in the last 168 hours.  Recent Results (from the past 240 hour(s))  Respiratory Panel by RT PCR (Flu A&B, Covid) - Nasopharyngeal Swab     Status: None   Collection Time: 09/09/19  3:08 PM   Specimen: Nasopharyngeal Swab  Result Value Ref Range Status   SARS Coronavirus 2 by RT PCR NEGATIVE NEGATIVE Final    Comment: (NOTE) SARS-CoV-2 target nucleic acids are NOT DETECTED. The SARS-CoV-2 RNA is generally detectable in upper respiratoy specimens during the acute phase of infection. The lowest concentration of SARS-CoV-2 viral copies this assay can detect is 131 copies/mL. A negative result does not preclude SARS-Cov-2 infection and should not be used as the sole basis for treatment or other patient management decisions. A negative result may occur  with  improper specimen collection/handling, submission of specimen other than nasopharyngeal swab, presence of viral mutation(s) within the areas targeted by this assay, and inadequate number of viral copies (<131 copies/mL). A negative result must be combined with clinical observations, patient history, and epidemiological information. The expected result is Negative. Fact Sheet for Patients:  PinkCheek.be Fact Sheet for Healthcare Providers:  GravelBags.it This test is not yet ap proved or cleared by the [place] FDA and  has been authorized for detection and/or diagnosis of SARS-CoV-2 by FDA under an Emergency Use Authorization (EUA). This EUA will remain  in effect (meaning this test can be used) for the duration of the COVID-19 declaration under Section 564(b)(1) of the Act, 21 U.S.C. section 360bbb-3(b)(1), unless the authorization is terminated or revoked sooner.    Influenza A by PCR NEGATIVE NEGATIVE Final   Influenza B by PCR NEGATIVE NEGATIVE Final    Comment: (NOTE) The Xpert Xpress SARS-CoV-2/FLU/RSV assay is intended as an aid in  the diagnosis of influenza from Nasopharyngeal swab specimens and  should not be used as a sole basis for treatment. Nasal washings and  aspirates are unacceptable for Xpert Xpress SARS-CoV-2/FLU/RSV  testing. Fact Sheet for Patients: PinkCheek.be Fact Sheet for Healthcare Providers: GravelBags.it This test is not yet approved or cleared by the [place] FDA and  has been authorized for detection and/or diagnosis of SARS-CoV-2 by  FDA under an Emergency Use Authorization (EUA). This EUA will remain  in effect (meaning this test  can be used) for the duration of the  Covid-19 declaration under Section 564(b)(1) of the Act, 21  U.S.C. section 360bbb-3(b)(1), unless the authorization is  terminated or revoked. Performed  at [facility], [place] [address] [place], [place] [address]   MRSA PCR Screening     Status: None   Collection Time: 09/09/19  9:22 PM   Specimen: Nasopharyngeal  Result Value Ref Range Status   MRSA by PCR NEGATIVE NEGATIVE Final    Comment:        The GeneXpert MRSA Assay (FDA approved for NASAL specimens only), is one component of a comprehensive MRSA colonization surveillance program. It is not intended to diagnose MRSA infection nor to guide or monitor treatment for MRSA infections. Performed at [facility], [place] [address] [place], [place] [address]          Radiology Studies: No results found.      Scheduled Meds: . amitriptyline  10 mg Oral QHS  . Chlorhexidine Gluconate Cloth  6 each Topical Daily  . docusate sodium  200 mg Oral BID  . fluticasone  2 spray Each Nare Daily  . guaiFENesin  600 mg Oral BID  . ipratropium-albuterol  3 mL Nebulization Once  . ipratropium-albuterol  3 mL Nebulization TID  . lidocaine  15 mL Mouth/Throat TID AC  . lipase/protease/amylase  24,000 Units Oral TID WC & HS  . mouth rinse  15 mL Mouth Rinse BID  . mirtazapine  15 mg Oral QHS  . oxyCODONE  5 mg Oral BID  . pantoprazole (PROTONIX) IV  40 mg Intravenous Q12H  . senna  2 tablet Oral QHS  . sucralfate  1 g Oral Q6H   Continuous Infusions: . fluconazole (DIFLUCAN) IV    . metronidazole 500 mg (09/15/19 1531)     Time spent: 30 minutes with over 50% of the time coordinating the patient's care    Harold Hedge, DO [facility] Pager [phone]  Call night coverage person covering after 7pm

## 2019-09-15 NOTE — Care Management Important Message (Signed)
Important Message  Patient Details IM Letter given to Dessa Phi RN Case Manager to present to the Patient Name: Claudia Wiley MRN: [id number] Date of Birth: [birth year]   Medicare Important Message Given:  Yes     [name], [name] 09/15/2019, 11:18 AM

## 2019-09-15 NOTE — TOC Progression Note (Signed)
Transition of Care [facility]) - Progression Note    Patient Details  Name: Claudia Wiley MRN: [id number] Date of Birth: Jan 01, 1956  Transition of Care [facility]) CM/SW Contact  Mayari Matus, Juliann Pulse, RN Phone Number: 09/15/2019, 1:57 PM  Clinical Narrative:[facility] orders,dme bedside commode order(they will deliver bedside commode to the home.Ronalee Belts w/[facility] aware of [place] orders-will give him tel#/fax# also.dtr [name] aware to contact [place] directly for FMLA papers to be filed.       Expected Discharge Plan: [place] Barriers to Discharge: Continued Medical Work up  Expected Discharge Plan and Services Expected Discharge Plan: [place]   Discharge Planning Services: CM Consult   Living arrangements for the past 2 months: Single Family Home                 DME Arranged: 3-N-1 DME Agency: [place] Arranged: PT, OT [facility] Agency: [facility] [employer]) Date [place]: 09/15/19 Time [place]: [id number] Representative spoke with at [place]: [place] ([place]) Interventions    Readmission Risk Interventions No flowsheet data found.

## 2019-09-16 ENCOUNTER — Inpatient Hospital Stay (HOSPITAL_COMMUNITY): Payer: No Typology Code available for payment source

## 2019-09-16 DIAGNOSIS — R0600 Dyspnea, unspecified: Secondary | ICD-10-CM

## 2019-09-16 LAB — BASIC METABOLIC PANEL
Anion gap: 10 (ref 5–15)
Anion gap: 7 (ref 5–15)
BUN: 8 mg/dL (ref 8–23)
BUN: 9 mg/dL (ref 8–23)
CO2: 44 mmol/L — ABNORMAL HIGH (ref 22–32)
CO2: 42 mmol/L — ABNORMAL HIGH (ref 22–32)
Calcium: 8.1 mg/dL — ABNORMAL LOW (ref 8.9–10.3)
Calcium: 8.1 mg/dL — ABNORMAL LOW (ref 8.9–10.3)
Chloride: 84 mmol/L — ABNORMAL LOW (ref 98–111)
Chloride: 90 mmol/L — ABNORMAL LOW (ref 98–111)
Creatinine, Ser: 0.32 mg/dL — ABNORMAL LOW (ref 0.44–1.00)
Creatinine, Ser: 0.35 mg/dL — ABNORMAL LOW (ref 0.44–1.00)
GFR calc Af Amer: >60 mL/min (ref >60)
GFR calc Af Amer: >60 mL/min (ref >60)
GFR calc non Af Amer: >60 mL/min (ref >60)
GFR calc non Af Amer: >60 mL/min (ref >60)
Glucose, Bld: 86 mg/dL (ref 70–99)
Glucose, Bld: 86 mg/dL (ref 70–99)
Potassium: 2.9 mmol/L — ABNORMAL LOW (ref 3.5–5.1)
Potassium: 3.7 mmol/L (ref 3.5–5.1)
Sodium: 138 mmol/L (ref 135–145)
Sodium: 139 mmol/L (ref 135–145)

## 2019-09-16 LAB — CBC
HCT: 35.1 % — ABNORMAL LOW (ref 36.0–46.0)
Hemoglobin: 11 g/dL — ABNORMAL LOW (ref 12.0–15.0)
MCH: 28.6 pg (ref 26.0–34.0)
MCHC: 31.3 g/dL (ref 30.0–36.0)
MCV: 91.4 fL (ref 80.0–100.0)
Platelets: 303 10*3/uL (ref 150–400)
RBC: 3.84 MIL/uL — ABNORMAL LOW (ref 3.87–5.11)
RDW: 15 % (ref 11.5–15.5)
WBC: 12.4 10*3/uL — ABNORMAL HIGH (ref 4.0–10.5)
nRBC: 0 % (ref 0.0–0.2)

## 2019-09-16 LAB — ECHOCARDIOGRAM COMPLETE
Height: 59 in
Weight: 1463.85 oz

## 2019-09-16 LAB — MAGNESIUM: Magnesium: 1.4 mg/dL — ABNORMAL LOW (ref 1.7–2.4)

## 2019-09-16 MED ORDER — POTASSIUM CHLORIDE CRYS ER 20 MEQ PO TBCR
40.0000 meq | EXTENDED_RELEASE_TABLET | Freq: Once | ORAL | Status: AC
  Administered 2019-09-16: 40 meq via ORAL
  Filled 2019-09-16: qty 2

## 2019-09-16 MED ORDER — FUROSEMIDE 10 MG/ML IJ SOLN
40.0000 mg | Freq: Two times a day (BID) | INTRAMUSCULAR | Status: DC
  Administered 2019-09-16 – 2019-09-17 (×2): 40 mg via INTRAVENOUS
  Filled 2019-09-16 (×2): qty 4

## 2019-09-16 MED ORDER — POTASSIUM CHLORIDE 10 MEQ/100ML IV SOLN
10.0000 meq | INTRAVENOUS | Status: AC
  Administered 2019-09-16 (×2): 10 meq via INTRAVENOUS
  Filled 2019-09-16 (×2): qty 100

## 2019-09-16 MED ORDER — MAGNESIUM SULFATE 4 GM/100ML IV SOLN
4.0000 g | Freq: Once | INTRAVENOUS | Status: AC
  Administered 2019-09-16: 4 g via INTRAVENOUS
  Filled 2019-09-16: qty 100

## 2019-09-16 NOTE — Progress Notes (Signed)
PROGRESS NOTE    Claudia Wiley    Code Status: Full Code  [id number] DOB: 10/30/1955 DOA: 09/09/2019 LOS: 7 days  PCP: System, Pcp Not In CC: No chief complaint on file.      Hospital Summary   64 year old female with history of esophageal stricture and dilatation 01/2018, gastrinoma status post Whipple's procedure, depression recently moved from [place] in 2018 who was admitted with intractable nausea and vomiting.  S/p EGD 2/27: Severe mucosal changes in the entire esophagus with circumferential plaque appearing mucosa suspicious for acute esophageal necrosis, transition to normal tissue at the GE junction, nonbleeding duodenal ulcers.  3/3 EGD culture growing unknown fungus and bacteria.  Special stains to further evaluate fungal infection  A & P   Active Problems:   COPD with acute exacerbation (HCC)   Respiratory failure (HCC)   Acute esophagitis   Esophageal dysphagia   Esophageal stenosis   Odynophagia   Leukocytosis   1. Severe esophagitis, odynophagia, slowly improving a. Esophageal biopsy showing inflammatory exudate and necrotic debris with fungal and bacterial organisms present, special stains being performed to better characterize infections b. Received 7 days empiric Flagyl and 6 days cefepime. Afebrile however increased leukocytosis. Will discontinue antibiotics for now but monitor closely c. Per GI: Continue Protonix twice daily long term, sucralfate 4 times daily x1 month, viscous lidocaine x 1 month, Fluconazole 200 mg daily x 14 days, advance to full liquid diet.  1 month follow-up outpatient.  GI signing off 2. Duodenal ulcers a. Biopsy with erosion improving gland hyperplasia, gastric biopsy with hyperemia and slight chronic inflammation, negative for H. pylori b. Plan as above 3. Acute on chronic hypoxic and hypercarbic respiratory failure likely secondary to volume overload with possible COPD exacerbation a. No echo on file b. Received large volumes of IV  fluids c. Weight steadily increasing from admission 36.3 kg-> 41.5 kg on 2/28, still no weight today. + 6 -> 4L at least since admit d. Hold further IV fluids now that she is tolerating CLD e. Continue Lasix 40 mg IV twice daily f. Continue DuoNebs g. Follow-up Echo 4. Anxiety and depression a. Continue Xanax and Elavil 5. 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 Continue pain medications b. tylenol for headache 6. Leukocytosis resolved 7. Hypokalemia a. Give p.o. and IV check magnesium   DVT prophylaxis: Lovenox, SCDs Family Communication: No family at bedside Disposition Plan:   Patient came from:   Home                                                                                          Anticipated d/c place: Home with home health  Barriers to d/c: Still requiring IV diuresis for volume status, O2 requirements and needs to continue to tolerate p.o. intake.  Hopeful discharge in the next several days  Pressure injury documentation    None  Consultants  GI  Procedures  EGD 2/27  Antibiotics   Anti-infectives (From admission, onward)   Start     Dose/Rate Route Frequency Ordered Stop   09/15/19 1600  fluconazole (DIFLUCAN) IVPB 200 mg     200 mg  100 mL/hr over 60 Minutes Intravenous Daily 09/15/19 1447     09/10/19 1400  metroNIDAZOLE (FLAGYL) IVPB 500 mg     500 mg 100 mL/hr over 60 Minutes Intravenous Every 8 hours 09/10/19 1305     09/09/19 2200  ceFEPIme (MAXIPIME) 2 g in sodium chloride 0.9 % 100 mL IVPB     2 g 200 mL/hr over 30 Minutes Intravenous 2 times daily 09/09/19 2119 09/14/19 0905        Subjective   Still admits to some swelling in her abdomen and shortness of breath.  Denies using oxygen at home except on as needed basis.  Denies any issues with full liquid diet at this time.  No complaints otherwise.  Objective   Vitals:   09/15/19 1916 09/15/19 2004 09/16/19 0410 09/16/19 0928  BP:  109/76 105/72   Pulse:  86  83   Resp:  17 18   Temp:  98.7 F (37.1 C) 98 F (36.7 C)   TempSrc:  Oral Oral   SpO2: 98% 98% 98% 93%  Weight:      Height:        Intake/Output Summary (Last 24 hours) at 09/16/2019 1126 Last data filed at 09/16/2019 0811 Gross per 24 hour  Intake 844.76 ml  Output 2675 ml  Net -1830.24 ml   Filed Weights   09/09/19 2300 09/11/19 0658 09/12/19 0400  Weight: 37.7 kg 37.7 kg 41.5 kg    Examination:  Physical Exam Vitals and nursing note reviewed.  Constitutional:      Appearance: She is not ill-appearing.  HENT:     Mouth/Throat:     Mouth: Mucous membranes are moist.  Eyes:     Conjunctiva/sclera: Conjunctivae normal.  Cardiovascular:     Rate and Rhythm: Normal rate and regular rhythm.  Pulmonary:     Breath sounds: Rales present.     Comments: Accessory muscle use and conversational dyspnea Abdominal:     General: Bowel sounds are normal.  Musculoskeletal:     Comments: Posterior lower extremity 1+ pitting edema  Neurological:     Mental Status: She is alert.  Psychiatric:        Mood and Affect: Mood normal.        Behavior: Behavior normal.        Thought Content: Thought content normal.     Data Reviewed: I have personally reviewed following labs and imaging studies  CBC: Recent Labs  Lab 09/09/19 1637 09/10/19 0215 09/12/19 0325 09/13/19 0517 09/14/19 0418 09/15/19 0429 09/16/19 0430  WBC 31.9*   < > 16.7* 11.4* 12.2* 10.1 12.4*  NEUTROABS 29.2*  --   --   --   --   --   --   HGB 15.6*   < > 11.1* 12.0 10.6* 11.3* 11.0*  HCT 49.7*   < > 36.3 39.4 34.2* 36.5 35.1*  MCV 91.7   < > 93.8 93.1 92.9 91.9 91.4  PLT 247   < > 226 226 227 278 303   < > = values in this interval not displayed.   Basic Metabolic Panel: Recent Labs  Lab 09/12/19 0325 09/13/19 0517 09/13/19 0700 09/14/19 0418 09/15/19 0429 09/16/19 0430  NA 141 139  --  141 141 138  K 4.4 3.6  --  4.0 3.3* 2.9*  CL 108 107  --  102 98 84*  CO2 28 28  --  34* 38* 44*    GLUCOSE 65* 104*  --  88 75  86  BUN 24* 11  --  8 10 8   CREATININE 0.40* 0.34*  --  <0.30* 0.31* 0.32*  CALCIUM 7.8* 7.9*  --  8.0* 8.3* 8.1*  MG  --   --  2.2  --   --   --    GFR: Estimated Creatinine Clearance: 48.4 mL/min (A) (by C-G formula based on SCr of 0.32 mg/dL (L)). Liver Function Tests: Recent Labs  Lab 09/10/19 0215 09/12/19 0325 09/13/19 0517 09/14/19 0418 09/15/19 0429  AST 22 29 25 20 22   ALT 17 18 17 18 20   ALKPHOS 82 59 60 47 55  BILITOT 0.4 0.6 0.2* 0.3 0.3  PROT 6.6 5.3* 5.2* 4.4* 4.9*  ALBUMIN 3.5 2.7* 2.6* 2.3* 2.5*   No results for input(s): LIPASE, AMYLASE in the last 168 hours. No results for input(s): AMMONIA in the last 168 hours. Coagulation Profile: No results for input(s): INR, PROTIME in the last 168 hours. Cardiac Enzymes: No results for input(s): CKTOTAL, CKMB, CKMBINDEX, TROPONINI in the last 168 hours. BNP (last 3 results) No results for input(s): PROBNP in the last 8760 hours. HbA1C: No results for input(s): HGBA1C in the last 72 hours. CBG: No results for input(s): GLUCAP in the last 168 hours. Lipid Profile: No results for input(s): CHOL, HDL, LDLCALC, TRIG, CHOLHDL, LDLDIRECT in the last 72 hours. Thyroid Function Tests: No results for input(s): TSH, T4TOTAL, FREET4, T3FREE, THYROIDAB in the last 72 hours. Anemia Panel: No results for input(s): VITAMINB12, FOLATE, FERRITIN, TIBC, IRON, RETICCTPCT in the last 72 hours. Sepsis Labs: No results for input(s): PROCALCITON, LATICACIDVEN in the last 168 hours.  Recent Results (from the past 240 hour(s))  Respiratory Panel by RT PCR (Flu A&B, Covid) - Nasopharyngeal Swab     Status: None   Collection Time: 09/09/19  3:08 PM   Specimen: Nasopharyngeal Swab  Result Value Ref Range Status   SARS Coronavirus 2 by RT PCR NEGATIVE NEGATIVE Final    Comment: (NOTE) SARS-CoV-2 target nucleic acids are NOT DETECTED. The SARS-CoV-2 RNA is generally detectable in upper respiratoy specimens  during the acute phase of infection. The lowest concentration of SARS-CoV-2 viral copies this assay can detect is 131 copies/mL. A negative result does not preclude SARS-Cov-2 infection and should not be used as the sole basis for treatment or other patient management decisions. A negative result may occur with  improper specimen collection/handling, submission of specimen other than nasopharyngeal swab, presence of viral mutation(s) within the areas targeted by this assay, and inadequate number of viral copies (<131 copies/mL). A negative result must be combined with clinical observations, patient history, and epidemiological information. The expected result is Negative. Fact Sheet for Patients:  PinkCheek.be Fact Sheet for Healthcare Providers:  GravelBags.it This test is not yet ap proved or cleared by the [place] FDA and  has been authorized for detection and/or diagnosis of SARS-CoV-2 by FDA under an Emergency Use Authorization (EUA). This EUA will remain  in effect (meaning this test can be used) for the duration of the COVID-19 declaration under Section 564(b)(1) of the Act, 21 U.S.C. section 360bbb-3(b)(1), unless the authorization is terminated or revoked sooner.    Influenza A by PCR NEGATIVE NEGATIVE Final   Influenza B by PCR NEGATIVE NEGATIVE Final    Comment: (NOTE) The Xpert Xpress SARS-CoV-2/FLU/RSV assay is intended as an aid in  the diagnosis of influenza from Nasopharyngeal swab specimens and  should not be used as a sole basis for treatment. Nasal washings and  aspirates are unacceptable for Xpert Xpress SARS-CoV-2/FLU/RSV  testing. Fact Sheet for Patients: PinkCheek.be Fact Sheet for Healthcare Providers: GravelBags.it This test is not yet approved or cleared by the [place] FDA and  has been authorized for detection and/or diagnosis of  SARS-CoV-2 by  FDA under an Emergency Use Authorization (EUA). This EUA will remain  in effect (meaning this test can be used) for the duration of the  Covid-19 declaration under Section 564(b)(1) of the Act, 21  U.S.C. section 360bbb-3(b)(1), unless the authorization is  terminated or revoked. Performed at [facility], [place] [address], [place], [place] [address]   MRSA PCR Screening     Status: None   Collection Time: 09/09/19  9:22 PM   Specimen: Nasopharyngeal  Result Value Ref Range Status   MRSA by PCR NEGATIVE NEGATIVE Final    Comment:        The GeneXpert MRSA Assay (FDA approved for NASAL specimens only), is one component of a comprehensive MRSA colonization surveillance program. It is not intended to diagnose MRSA infection nor to guide or monitor treatment for MRSA infections. Performed at [facility], [place] [address], [place], [place] [address]          Radiology Studies: No results found.      Scheduled Meds: . amitriptyline  10 mg Oral QHS  . Chlorhexidine Gluconate Cloth  6 each Topical Daily  . docusate sodium  200 mg Oral BID  . enoxaparin (LOVENOX) injection  20 mg Subcutaneous Q24H  . fluticasone  2 spray Each Nare Daily  . furosemide  40 mg Intravenous BID  . guaiFENesin  600 mg Oral BID  . ipratropium-albuterol  3 mL Nebulization Once  . ipratropium-albuterol  3 mL Nebulization TID  . lidocaine  15 mL Mouth/Throat TID AC  . lipase/protease/amylase  24,000 Units Oral TID WC & HS  . mouth rinse  15 mL Mouth Rinse BID  . mirtazapine  15 mg Oral QHS  . oxyCODONE  5 mg Oral BID  . pantoprazole (PROTONIX) IV  40 mg Intravenous Q12H  . senna  2 tablet Oral QHS  . sucralfate  1 g Oral Q6H   Continuous Infusions: . fluconazole (DIFLUCAN) IV 200 mg (09/16/19 1001)  . metronidazole 500 mg (09/16/19 0812)  . potassium chloride 10 mEq (09/16/19 1121)     Time spent: 25 minutes with over 50% of the time  coordinating the patient's care    Harold Hedge, DO [facility] Pager [phone]  Call night coverage person covering after 7pm

## 2019-09-16 NOTE — Progress Notes (Signed)
Occupational Therapy Treatment Patient Details Name: Claudia Wiley MRN: [id number] DOB: 1956-04-23 Today's Date: 09/16/2019    History of present illness 64 year old female with a history of esophageal stricture requiring dilation in the past- 12/2017, admitted with COPD exacerbation, cough, N/V, epigastric pain, and dysphagia.  Additionally, history of LUL lobectomy and gastrinoma. Had EGD 09/11/19   OT comments  Patient has demonstrated progress with participation in sponge bath while sitting EOB with extra time and a few rest breaks. Verbal cues for pursed lip breathing techniques while completing dressing. Patient was able to transfer to [facility] with CGA only. Patient safely completed toileting task with CGA. Patient able to take about 6 steps to recliner at Minimal A level. Patient was only able to sit in recliner chair for about 5 minutes before ECHO required the patient to return to bed. Pt will benefit from continued skilled acute OT services.   Follow Up Recommendations  Home health OT;Supervision/Assistance - 24 hour;Other (comment)    Equipment Recommendations  3 in 1 bedside commode    Recommendations for Other Services      Precautions / Restrictions Precautions Precautions: Fall Precaution Comments: currently on 2L O2 [place]       Mobility Bed Mobility Overal bed mobility: Needs Assistance Bed Mobility: Supine to Sit     Supine to sit: Min guard;HOB elevated        Transfers                      Balance   Sitting-balance support: No upper extremity supported;Feet supported Sitting balance-Leahy Scale: Good     Standing balance support: Bilateral upper extremity supported Standing balance-Leahy Scale: Fair                             ADL either performed or assessed with clinical judgement   ADL       Grooming: Wash/dry hands;Wash/dry face;Set up   Upper Body Bathing: Set up   Lower Body Bathing: Min guard Lower Body Bathing Details  (indicate cue type and reason): sitting for B LE and standing for periareas Upper Body Dressing : Minimal assistance;Sitting   Lower Body Dressing: Minimal assistance   Toilet Transfer: Min guard   Toileting- Clothing Manipulation and Hygiene: Min guard               Vision       Perception     Praxis      Cognition Arousal/Alertness: Awake/alert   Overall Cognitive Status: Within Functional Limits for tasks assessed                                          Exercises     Shoulder Instructions       General Comments      Pertinent Vitals/ Pain       Pain Assessment: 0-10  Home Living                                          Prior Functioning/Environment              Frequency           Progress Toward Goals  OT Goals(current goals can now be found in  the care plan section)  Progress towards OT goals: Progressing toward goals     Plan Discharge plan remains appropriate    Co-evaluation                 AM-PAC OT "6 Clicks" Daily Activity     Outcome Measure   Help from another person eating meals?: A Little Help from another person taking care of personal grooming?: A Little Help from another person toileting, which includes using toliet, bedpan, or urinal?: A Little Help from another person bathing (including washing, rinsing, drying)?: A Little Help from another person to put on and taking off regular upper body clothing?: A Little Help from another person to put on and taking off regular lower body clothing?: A Little 6 Click Score: 18    End of Session Equipment Utilized During Treatment: Oxygen      Activity Tolerance Patient tolerated treatment well   Patient Left in bed;with call bell/phone within reach;with bed alarm set   Nurse Communication Mobility status        Time: [id number] OT Time Calculation (min): 32 min  Charges: OT General Charges $OT Visit: 1 Visit OT  Treatments $Self Care/Home Management : 8-22 mins $Therapeutic Activity: 8-22 mins  Loel Betancur OTR/L    Kitti Mcclish 09/16/2019, 4:20 PM

## 2019-09-16 NOTE — Progress Notes (Signed)
    Progress Note   Subjective  Odynophagia improving.    Objective  Vital signs in last 24 hours: Temp:  [97.4 F (36.3 C)-98.7 F (37.1 C)] 98 F (36.7 C) (03/04 0410) Pulse Rate:  [83-99] 83 (03/04 0410) Resp:  [15-18] 18 (03/04 0410) BP: (105-121)/(72-81) 105/72 (03/04 0410) SpO2:  [98 %] 98 % (03/04 0410) Last BM Date: 09/13/19  General: Alert, thin, in NAD Heart:  Regular rate and rhythm; no murmurs Chest: Clear to ascultation bilaterally Abdomen:  Soft, nontender and nondistended. Normal bowel sounds, without guarding, and without rebound.   Extremities:  Without edema. Neurologic:  Alert and  oriented x4; grossly normal neurologically. Psych:  Alert and cooperative. Normal mood and affect.  Intake/Output from previous day: 03/03 0701 - 03/04 0700 In: 844.8 [P.O.:120; IV Piggyback:724.8] Out: 2200 [Urine:2200] Intake/Output this shift: Total I/O In: -  Out: 475 [Urine:475]  Lab Results: Recent Labs    09/14/19 0418 09/15/19 0429 09/16/19 0430  WBC 12.2* 10.1 12.4*  HGB 10.6* 11.3* 11.0*  HCT 34.2* 36.5 35.1*  PLT 227 278 303   BMET Recent Labs    09/14/19 0418 09/15/19 0429 09/16/19 0430  NA 141 141 138  K 4.0 3.3* 2.9*  CL 102 98 84*  CO2 34* 38* 44*  GLUCOSE 88 75 86  BUN 8 10 8   CREATININE <0.30* 0.31* 0.32*  CALCIUM 8.0* 8.3* 8.1*   LFT Recent Labs    09/15/19 0429  PROT 4.9*  ALBUMIN 2.5*  AST 22  ALT 20  ALKPHOS 55  BILITOT 0.3      Assessment & Recommendations   1. Severe esophagitis due to reflux and candida. Odynophagia improving.  Continue pantoprazole 40 mg po bid long term  Sucralfate suspension 1 g qid for 1 month  Viscous lidocaine 2% 15 mL po ac prn for 1 month  Diflucan qd for 14 days  Advance to full liquids. Would not advance beyond full liquids for several days. Ensure qid between meals Antireflux measures long term OK for discharge from GI standpoint when she is taking adequate PO nutrition.   2. Duodenal  ulcers. Avoid NSAIDs. Pantoprazole as above.   Outpatient GI office follow up with me or APP in 1 month.  GI signing off, available if needed.     LOS: 7 days   Annalisa Colonna T. Fuller Plan MD 09/16/2019, 8:50 AM

## 2019-09-16 NOTE — Progress Notes (Signed)
  Echocardiogram 2D Echocardiogram has been performed.  Jennette Dubin 09/16/2019, 3:39 PM

## 2019-09-17 LAB — BASIC METABOLIC PANEL
Anion gap: 10 (ref 5–15)
BUN: 7 mg/dL — ABNORMAL LOW (ref 8–23)
CO2: 42 mmol/L — ABNORMAL HIGH (ref 22–32)
Calcium: 8.7 mg/dL — ABNORMAL LOW (ref 8.9–10.3)
Chloride: 85 mmol/L — ABNORMAL LOW (ref 98–111)
Creatinine, Ser: 0.38 mg/dL — ABNORMAL LOW (ref 0.44–1.00)
GFR calc Af Amer: >60 mL/min (ref >60)
GFR calc non Af Amer: >60 mL/min (ref >60)
Glucose, Bld: 81 mg/dL (ref 70–99)
Potassium: 3.3 mmol/L — ABNORMAL LOW (ref 3.5–5.1)
Sodium: 137 mmol/L (ref 135–145)

## 2019-09-17 LAB — MAGNESIUM: Magnesium: 2.1 mg/dL (ref 1.7–2.4)

## 2019-09-17 LAB — CBC
HCT: 39.2 % (ref 36.0–46.0)
Hemoglobin: 12.1 g/dL (ref 12.0–15.0)
MCH: 28.2 pg (ref 26.0–34.0)
MCHC: 30.9 g/dL (ref 30.0–36.0)
MCV: 91.4 fL (ref 80.0–100.0)
Platelets: 418 10*3/uL — ABNORMAL HIGH (ref 150–400)
RBC: 4.29 MIL/uL (ref 3.87–5.11)
RDW: 14.7 % (ref 11.5–15.5)
WBC: 10 10*3/uL (ref 4.0–10.5)
nRBC: 0 % (ref 0.0–0.2)

## 2019-09-17 MED ORDER — POTASSIUM CHLORIDE CRYS ER 20 MEQ PO TBCR
20.0000 meq | EXTENDED_RELEASE_TABLET | Freq: Once | ORAL | Status: AC
  Administered 2019-09-17: 20 meq via ORAL
  Filled 2019-09-17: qty 1

## 2019-09-17 MED ORDER — SODIUM CHLORIDE 0.9% FLUSH
10.0000 mL | INTRAVENOUS | Status: DC | PRN
  Administered 2019-09-18: 10 mL

## 2019-09-17 MED ORDER — SODIUM CHLORIDE 0.9% FLUSH
10.0000 mL | Freq: Two times a day (BID) | INTRAVENOUS | Status: DC
  Administered 2019-09-17 – 2019-09-18 (×2): 10 mL

## 2019-09-17 MED ORDER — FUROSEMIDE 10 MG/ML IJ SOLN
60.0000 mg | Freq: Two times a day (BID) | INTRAMUSCULAR | Status: DC
  Administered 2019-09-17 – 2019-09-18 (×3): 60 mg via INTRAVENOUS
  Filled 2019-09-17 (×3): qty 6

## 2019-09-17 MED ORDER — ENSURE ENLIVE PO LIQD
237.0000 mL | Freq: Three times a day (TID) | ORAL | Status: DC
  Administered 2019-09-18 – 2019-09-19 (×4): 237 mL via ORAL

## 2019-09-17 NOTE — Progress Notes (Signed)
Nutrition Follow-up  DOCUMENTATION CODES:   Underweight  INTERVENTION:  Ensure Enlive po TID, each supplement provides 350 kcal and 20 grams of protein  MVI with minerals daily  NUTRITION DIAGNOSIS:   Inadequate oral intake related to acute illness, nausea, vomiting as evidenced by per patient/family report, NPO status.  Ongoing   GOAL:   Patient will meet greater than or equal to 90% of their needs  Progressing  MONITOR:   Diet advancement, Labs, Weight trends, I & O's  REASON FOR ASSESSMENT:   Malnutrition Screening Tool, Consult Assessment of nutrition requirement/status  ASSESSMENT:   64 year old female with history of esophageal stricture and dilatation 01/2018, gastrinoma s/p Whipple, lobectomy of LUL, and depression. She was admitted with intractable N/V.  History mainly obtained from the patient's daughter who reported patient has been unable to eat or drink anything due to persistent vomiting and severe abdominal pain.  2/27 EGD - acute esophageal necrosis 3/3 EGD - culture growing unknown fungus and bacteria; special stains to further evaluate fungal infection  Per notes: -severe esophagitis, odynophagia improving -duodenal ulcers,biopsy negative for H. Pylori -holding further IV fluids, tolerating CLD  Patient diet advanced to FL, no documented meals for review since diet progression. Patient with poor oral intake on clear liquids, per history she was consuming 0-25% of meals from 2/26 - 3/3.  Non-pitting RLE edema noted per RN assessment.  Current wt 37.3 kg   Admit wt 37.7 kg  Medications reviewed and include: Colace, Lasix, Creon, Remeron, Protonix, Senokot, Carafate Diflucan  Labs: K 3.3 (L), BUN 7 (L), Cr 0.38 (L) Mg WNL  Diet Order:   Diet Order            Diet full liquid Room service appropriate? Yes; Fluid consistency: Thin  Diet effective now              EDUCATION NEEDS:   No education needs have been identified at this  time  Skin:  Skin Assessment: Reviewed RN Assessment  Last BM:  3/5 type 5  Height:   Ht Readings from Last 1 Encounters:  09/11/19 4\' 11"  (1.499 m)    Weight:   Wt Readings from Last 1 Encounters:  09/17/19 37.3 kg    Ideal Body Weight:  42.9 kg  BMI:  Body mass index is 16.6 kg/m.  Estimated Nutritional Needs:   Kcal:  1510-1810 kcal  Protein:  70-85 grams  Fluid:  >/= 1.8 L/day   Lajuan Lines, RD, LDN Clinical Nutrition After Hours/Weekend Pager # in [place]

## 2019-09-17 NOTE — Progress Notes (Signed)
PHYSICAL THERAPY  SATURATION QUALIFICATIONS: (This note is used to comply with regulatory documentation for home oxygen)  Patient Saturations on Room Air at Rest = 92% HR 95  Patient Saturations on Room Air while Ambulating 35 feet = 83% HR 121  Patient Saturations on 2 Liters of oxygen while Ambulating = 88% Patient Saturations on 3 Liters of oxygen while ambulating = 90%  Please briefly explain why patient needs home oxygen:  Pt requires supplementary oxygen to achieve a therapeutic level esp with activity.  Rica Koyanagi  PTA Acute  Rehabilitation Services Pager      [phone] Office      [phone]

## 2019-09-17 NOTE — Progress Notes (Signed)
PROGRESS NOTE    Claudia Wiley    Code Status: Full Code  [id number] DOB: 09-Nov-1955 DOA: 09/09/2019 LOS: 8 days  PCP: System, Pcp Not In CC: No chief complaint on file.      Hospital Summary   64 year old female with history of esophageal stricture and dilatation 01/2018, gastrinoma status post Whipple's procedure, depression recently moved from [place] in 2018 who was admitted with intractable nausea and vomiting.  S/p EGD 2/27: Severe mucosal changes in the entire esophagus with circumferential plaque appearing mucosa suspicious for acute esophageal necrosis, transition to normal tissue at the GE junction, nonbleeding duodenal ulcers.  3/3 EGD culture growing unknown fungus and bacteria.  Special stains to further evaluate fungal infection  A & P   Active Problems:   COPD with acute exacerbation (HCC)   Respiratory failure (HCC)   Acute esophagitis   Esophageal dysphagia   Esophageal stenosis   Odynophagia   Leukocytosis   1. Severe esophagitis, odynophagia, improving a. Esophageal biopsy showing inflammatory exudate and necrotic debris with fungal and bacterial organisms present, special stains being performed to better characterize infections b. Received 7 days empiric Flagyl and 6 days cefepime. Afebrile however increased leukocytosis. Off antibiotics now c. Per GI: Continue Protonix twice daily long term, sucralfate 4 times daily x1 month, viscous lidocaine x 1 month, Fluconazole 200 mg daily x 14 days, advance to full liquid diet.  1 month follow-up outpatient.  GI signing off 2. Duodenal ulcers a. Biopsy with erosion improving gland hyperplasia, gastric biopsy with hyperemia and slight chronic inflammation, negative for H. pylori b. Plan as above 3. Acute on chronic hypoxic and hypercarbic respiratory failure likely secondary to volume overload with possible COPD exacerbation a. Wears 2 L/min [place] as needed/on exertion, at baseline, currently requiring continuous b. Echo  3/4: EF [id number] without systolic or diastolic dysfunction noted c. Received large volumes of IV fluids d. Weight steadily increasing from admission 36.3 kg-> 41.5 kg on 2/28, weight 37.3 kg today, not at dry weight yet. e. + 6 -> 4L ->2.3L today f. Hold further IV fluids now that she is tolerating CLD g. Continue Lasix 40 mg IV twice daily h. Continue DuoNebs 4. Anxiety and depression a. Continue Xanax and Elavil 5. 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 Continue pain medications b. tylenol for headache 6. Leukocytosis resolved 7. Hypokalemia a. Give p.o. b. Monitor while on lasix 8. Low BMI a. Body mass index is 16.6 kg/m.  b. Advise increased PO intake when able   DVT prophylaxis: Lovenox, SCDs Family Communication: discussed with daughter at bedside Disposition Plan:   Patient came from:   Home                                                                                          Anticipated d/c place: Home with home health  Barriers to d/c: Still requiring IV diuresis for volume status, O2 requirements and needs to continue to tolerate p.o. intake.  Hopeful discharge in the next several days  Pressure injury documentation    None  Consultants  GI  Procedures  EGD 2/27  Antibiotics  Anti-infectives (From admission, onward)   Start     Dose/Rate Route Frequency Ordered Stop   09/15/19 1600  fluconazole (DIFLUCAN) IVPB 200 mg     200 mg 100 mL/hr over 60 Minutes Intravenous Daily 09/15/19 1447     09/10/19 1400  metroNIDAZOLE (FLAGYL) IVPB 500 mg  Status:  Discontinued     500 mg 100 mL/hr over 60 Minutes Intravenous Every 8 hours 09/10/19 1305 09/16/19 1133   09/09/19 2200  ceFEPIme (MAXIPIME) 2 g in sodium chloride 0.9 % 100 mL IVPB     2 g 200 mL/hr over 30 Minutes Intravenous 2 times daily 09/09/19 2119 09/14/19 0905        Subjective   Patient seen and examined at bedside no acute distress and resting comfortably.  No  events overnight.  Tolerating diet.  Denies any chest pain, shortness of breath, fever, nausea, vomiting, urinary complaints.   Otherwise ROS negative   Objective   Vitals:   09/17/19 1203 09/17/19 1303 09/17/19 1317 09/17/19 1454  BP: 120/82   138/76  Pulse: 90   87  Resp: 14   20  Temp: 98 F (36.7 C)   99.3 F (37.4 C)  TempSrc: Oral   Oral  SpO2: 92% 93% 91% 96%  Weight:      Height:        Intake/Output Summary (Last 24 hours) at 09/17/2019 1506 Last data filed at 09/17/2019 1456 Gross per 24 hour  Intake --  Output 1900 ml  Net -1900 ml   Filed Weights   09/11/19 0658 09/12/19 0400 09/17/19 0456  Weight: 37.7 kg 41.5 kg 37.3 kg    Examination:  Physical Exam Vitals and nursing note reviewed.  Constitutional:      Comments: frail  HENT:     Head: Normocephalic and atraumatic.  Eyes:     Conjunctiva/sclera: Conjunctivae normal.  Cardiovascular:     Rate and Rhythm: Normal rate and regular rhythm.  Pulmonary:     Breath sounds: Rales present.     Comments: Diffuse rales Abdominal:     General: Abdomen is flat.     Palpations: Abdomen is soft.  Musculoskeletal:        General: No swelling or tenderness.  Skin:    Coloration: Skin is not jaundiced or pale.  Neurological:     Mental Status: She is alert. Mental status is at baseline.  Psychiatric:        Mood and Affect: Mood normal.        Behavior: Behavior normal.     Data Reviewed: I have personally reviewed following labs and imaging studies  CBC: Recent Labs  Lab 09/13/19 0517 09/14/19 0418 09/15/19 0429 09/16/19 0430 09/17/19 0529  WBC 11.4* 12.2* 10.1 12.4* 10.0  HGB 12.0 10.6* 11.3* 11.0* 12.1  HCT 39.4 34.2* 36.5 35.1* 39.2  MCV 93.1 92.9 91.9 91.4 91.4  PLT 226 227 278 303 [id number]*   Basic Metabolic Panel: Recent Labs  Lab 09/13/19 0517 09/13/19 0700 09/14/19 0418 09/15/19 0429 09/16/19 0430 09/16/19 1304 09/17/19 0529  NA   < >  --  141 141 138 139 137  K   < >  --  4.0  3.3* 2.9* 3.7 3.3*  CL   < >  --  102 98 84* 90* 85*  CO2   < >  --  34* 38* 44* 42* 42*  GLUCOSE   < >  --  88 75 86 86 81  BUN   < >  --  8 10 8 9  7*  CREATININE   < >  --  <0.30* 0.31* 0.32* 0.35* 0.38*  CALCIUM   < >  --  8.0* 8.3* 8.1* 8.1* 8.7*  MG  --  2.2  --   --  1.4*  --  2.1   < > = values in this interval not displayed.   GFR: Estimated Creatinine Clearance: 43.5 mL/min (A) (by C-G formula based on SCr of 0.38 mg/dL (L)). Liver Function Tests: Recent Labs  Lab 09/12/19 0325 09/13/19 0517 09/14/19 0418 09/15/19 0429  AST 29 25 20 22   ALT 18 17 18 20   ALKPHOS 59 60 47 55  BILITOT 0.6 0.2* 0.3 0.3  PROT 5.3* 5.2* 4.4* 4.9*  ALBUMIN 2.7* 2.6* 2.3* 2.5*   No results for input(s): LIPASE, AMYLASE in the last 168 hours. No results for input(s): AMMONIA in the last 168 hours. Coagulation Profile: No results for input(s): INR, PROTIME in the last 168 hours. Cardiac Enzymes: No results for input(s): CKTOTAL, CKMB, CKMBINDEX, TROPONINI in the last 168 hours. BNP (last 3 results) No results for input(s): PROBNP in the last 8760 hours. HbA1C: No results for input(s): HGBA1C in the last 72 hours. CBG: No results for input(s): GLUCAP in the last 168 hours. Lipid Profile: No results for input(s): CHOL, HDL, LDLCALC, TRIG, CHOLHDL, LDLDIRECT in the last 72 hours. Thyroid Function Tests: No results for input(s): TSH, T4TOTAL, FREET4, T3FREE, THYROIDAB in the last 72 hours. Anemia Panel: No results for input(s): VITAMINB12, FOLATE, FERRITIN, TIBC, IRON, RETICCTPCT in the last 72 hours. Sepsis Labs: No results for input(s): PROCALCITON, LATICACIDVEN in the last 168 hours.  Recent Results (from the past 240 hour(s))  Respiratory Panel by RT PCR (Flu A&B, Covid) - Nasopharyngeal Swab     Status: None   Collection Time: 09/09/19  3:08 PM   Specimen: Nasopharyngeal Swab  Result Value Ref Range Status   SARS Coronavirus 2 by RT PCR NEGATIVE NEGATIVE Final    Comment:  (NOTE) SARS-CoV-2 target nucleic acids are NOT DETECTED. The SARS-CoV-2 RNA is generally detectable in upper respiratoy specimens during the acute phase of infection. The lowest concentration of SARS-CoV-2 viral copies this assay can detect is 131 copies/mL. A negative result does not preclude SARS-Cov-2 infection and should not be used as the sole basis for treatment or other patient management decisions. A negative result may occur with  improper specimen collection/handling, submission of specimen other than nasopharyngeal swab, presence of viral mutation(s) within the areas targeted by this assay, and inadequate number of viral copies (<131 copies/mL). A negative result must be combined with clinical observations, patient history, and epidemiological information. The expected result is Negative. Fact Sheet for Patients:  PinkCheek.be Fact Sheet for Healthcare Providers:  GravelBags.it This test is not yet ap proved or cleared by the [place] FDA and  has been authorized for detection and/or diagnosis of SARS-CoV-2 by FDA under an Emergency Use Authorization (EUA). This EUA will remain  in effect (meaning this test can be used) for the duration of the COVID-19 declaration under Section 564(b)(1) of the Act, 21 U.S.C. section 360bbb-3(b)(1), unless the authorization is terminated or revoked sooner.    Influenza A by PCR NEGATIVE NEGATIVE Final   Influenza B by PCR NEGATIVE NEGATIVE Final    Comment: (NOTE) The Xpert Xpress SARS-CoV-2/FLU/RSV assay is intended as an aid in  the diagnosis of influenza from Nasopharyngeal swab specimens and  should not be used as a sole basis for treatment.  Nasal washings and  aspirates are unacceptable for Xpert Xpress SARS-CoV-2/FLU/RSV  testing. Fact Sheet for Patients: PinkCheek.be Fact Sheet for Healthcare  Providers: GravelBags.it This test is not yet approved or cleared by the [place] FDA and  has been authorized for detection and/or diagnosis of SARS-CoV-2 by  FDA under an Emergency Use Authorization (EUA). This EUA will remain  in effect (meaning this test can be used) for the duration of the  Covid-19 declaration under Section 564(b)(1) of the Act, 21  U.S.C. section 360bbb-3(b)(1), unless the authorization is  terminated or revoked. Performed at [facility], [place] [address], [place], [place] [address]   MRSA PCR Screening     Status: None   Collection Time: 09/09/19  9:22 PM   Specimen: Nasopharyngeal  Result Value Ref Range Status   MRSA by PCR NEGATIVE NEGATIVE Final    Comment:        The GeneXpert MRSA Assay (FDA approved for NASAL specimens only), is one component of a comprehensive MRSA colonization surveillance program. It is not intended to diagnose MRSA infection nor to guide or monitor treatment for MRSA infections. Performed at [facility], [place] [address] [place], [place] [address]          Radiology Studies: ECHOCARDIOGRAM COMPLETE  Result Date: 09/16/2019    ECHOCARDIOGRAM REPORT   Patient Name:   Claudia Wiley Date of Exam: 09/16/2019 Medical Rec #:  [id number]   Height:       59.0 in Accession #:    [id number]  Weight:       91.5 lb Date of Birth:  [birth year]   BSA:          1.323 m Patient Age:    46 years    BP:           96/89 mmHg Patient Gender: F           HR:           85 bpm. Exam Location:  Inpatient Procedure: 2D Echo Indications:    Dyspnea R06.00  History:        Patient has no prior history of Echocardiogram examinations.                 COPD; Risk Factors:Hypertension and Diabetes.  Sonographer:    Mikki Santee RDCS (AE) Referring Phys: [id number] Sterrett E Donja Tipping IMPRESSIONS  1. Left ventricular ejection fraction, by estimation, is 60 to 65%. The left ventricle has normal  function. The left ventricle has no regional wall motion abnormalities. Left ventricular diastolic parameters were normal.  2. Right ventricular systolic function is normal. The right ventricular size is normal.  3. The mitral valve is normal in structure and function. Mild mitral valve regurgitation. No evidence of mitral stenosis.  4. The aortic valve is normal in structure and function. Aortic valve regurgitation is not visualized. No aortic stenosis is present.  5. The inferior vena cava is normal in size with greater than 50% respiratory variability, suggesting right atrial pressure of 3 mmHg. FINDINGS  Left Ventricle: Left ventricular ejection fraction, by estimation, is 60 to 65%. The left ventricle has normal function. The left ventricle has no regional wall motion abnormalities. The left ventricular internal cavity size was normal in size. There is  no left ventricular hypertrophy. Left ventricular diastolic parameters were normal. Right Ventricle: The right ventricular size is normal. No increase in right ventricular wall thickness. Right ventricular systolic function is normal. Left Atrium: Left atrial  size was normal in size. Right Atrium: Right atrial size was normal in size. Pericardium: There is no evidence of pericardial effusion. Mitral Valve: The mitral valve is normal in structure and function. Normal mobility of the mitral valve leaflets. Mild mitral valve regurgitation. No evidence of mitral valve stenosis. Tricuspid Valve: The tricuspid valve is normal in structure. Tricuspid valve regurgitation is not demonstrated. No evidence of tricuspid stenosis. Aortic Valve: The aortic valve is normal in structure and function. Aortic valve regurgitation is not visualized. No aortic stenosis is present. Pulmonic Valve: The pulmonic valve was normal in structure. Pulmonic valve regurgitation is not visualized. No evidence of pulmonic stenosis. Aorta: The aortic root is normal in size and structure. Venous:  The inferior vena cava is normal in size with greater than 50% respiratory variability, suggesting right atrial pressure of 3 mmHg. IAS/Shunts: No atrial level shunt detected by color flow Doppler.  LEFT VENTRICLE PLAX 2D LVIDd:         3.80 cm  Diastology LVIDs:         2.40 cm  LV e' lateral:   8.05 cm/s LV PW:         0.80 cm  LV E/e' lateral: 7.0 LV IVS:        0.80 cm  LV e' medial:    6.96 cm/s LVOT diam:     1.70 cm  LV E/e' medial:  8.1 LV SV:         28 LV SV Index:   21 LVOT Area:     2.27 cm  RIGHT VENTRICLE RV S prime:     11.00 cm/s TAPSE (M-mode): 1.4 cm LEFT ATRIUM             Index       RIGHT ATRIUM           Index LA diam:        1.70 cm 1.29 cm/m  RA Area:     11.60 cm LA Vol (A2C):   17.4 ml 13.15 ml/m RA Volume:   28.30 ml  21.39 ml/m LA Vol (A4C):   9.0 ml  6.82 ml/m LA Biplane Vol: 12.4 ml 9.37 ml/m  AORTIC VALVE LVOT Vmax:   73.30 cm/s LVOT Vmean:  45.800 cm/s LVOT VTI:    0.125 m  AORTA Ao Root diam: 2.70 cm MITRAL VALVE MV Area (PHT): 3.74 cm    SHUNTS MV Decel Time: 203 msec    Systemic VTI:  0.12 m MV E velocity: 56.30 cm/s  Systemic Diam: 1.70 cm MV A velocity: 58.50 cm/s MV E/A ratio:  0.96 Mihai Croitoru MD Electronically signed by Sanda Klein MD Signature Date/Time: 09/16/2019/3:46:29 PM    Final         Scheduled Meds: . amitriptyline  10 mg Oral QHS  . docusate sodium  200 mg Oral BID  . enoxaparin (LOVENOX) injection  20 mg Subcutaneous Q24H  . fluticasone  2 spray Each Nare Daily  . furosemide  40 mg Intravenous BID  . guaiFENesin  600 mg Oral BID  . ipratropium-albuterol  3 mL Nebulization Once  . ipratropium-albuterol  3 mL Nebulization TID  . lidocaine  15 mL Mouth/Throat TID AC  . lipase/protease/amylase  24,000 Units Oral TID WC & HS  . mouth rinse  15 mL Mouth Rinse BID  . mirtazapine  15 mg Oral QHS  . oxyCODONE  5 mg Oral BID  . pantoprazole (PROTONIX) IV  40 mg Intravenous Q12H  . senna  2 tablet Oral QHS  . sodium chloride flush  10-40 mL  Intracatheter Q12H  . sucralfate  1 g Oral Q6H   Continuous Infusions: . fluconazole (DIFLUCAN) IV 200 mg (09/17/19 1217)     Time spent: 20 minutes with over 50% of the time coordinating the patient's care    Harold Hedge, DO [facility] Pager [phone]  Call night coverage person covering after 7pm

## 2019-09-17 NOTE — Progress Notes (Signed)
Physical Therapy Treatment Patient Details Name: Claudia Wiley MRN: [id number] DOB: [birth year] Today's Date: 09/17/2019    History of Present Illness 64 year old female with a history of esophageal stricture requiring dilation in the past- 12/2017, admitted with COPD exacerbation, cough, N/V, epigastric pain, and dysphagia.  Additionally, history of LUL lobectomy and gastrinoma. Had EGD 09/11/19    PT Comments    Assisted OOB to amb a functional distance in hallway.  General Gait Details: Trial RA sats decreased to 83% and HR increased to 122.  Pt required 3 lts nasal to achieve sats > 90%.  2/4 dyspnea noted.  Instructed on purse lip breathing however pt appears to be 100% mouth breather. SATURATION QUALIFICATIONS: (This note is used to comply with regulatory documentation for home oxygen)  Patient Saturations on Room Air at Rest = 92% HR 95  Patient Saturations on Room Air while Ambulating 35 feet = 83% HR 121  Patient Saturations on 2 Liters of oxygen while Ambulating = 88% Patient Saturations on 3 Liters of oxygen while ambulating = 90%  Please briefly explain why patient needs home oxygen:  Pt requires supplementary oxygen to achieve a therapeutic level esp with activity.   Follow Up Recommendations  Home health PT;Supervision for mobility/OOB     Equipment Recommendations  None recommended by PT    Recommendations for Other Services       Precautions / Restrictions Precautions Precautions: Fall Precaution Comments: monitor sats Restrictions Weight Bearing Restrictions: No    Mobility  Bed Mobility Overal bed mobility: Needs Assistance Bed Mobility: Supine to Sit     Supine to sit: Supervision     General bed mobility comments: increased time  Transfers Overall transfer level: Needs assistance Equipment used: None;Rolling walker (2 wheeled) Transfers: Sit to/from Stand Sit to Stand: Supervision;Min guard         General transfer comment: one VC safety  with turns with walker.  "Mine has 4 wheels and turns better'.  Ambulation/Gait Ambulation/Gait assistance: Supervision;Min guard Gait Distance (Feet): 42 Feet Assistive device: Rolling walker (2 wheeled) Gait Pattern/deviations: Decreased stride length;Shuffle;Trunk flexed Gait velocity: decr   General Gait Details: Trial RA sats decreased to 83% and HR increased to 122.  Pt required 3 lts nasal to achieve sats > 90%.  2/4 dyspnea noted.  Instructed on purse lip breathing however pt appears to be 100% mouth breather.   Stairs             Wheelchair Mobility    Modified Rankin (Stroke Patients Only)       Balance                                            Cognition Arousal/Alertness: Awake/alert Behavior During Therapy: WFL for tasks assessed/performed Overall Cognitive Status: Within Functional Limits for tasks assessed                                        Exercises      General Comments        Pertinent Vitals/Pain Pain Assessment: No/denies pain    Home Living                      Prior Function  PT Goals (current goals can now be found in the care plan section) Progress towards PT goals: Progressing toward goals    Frequency    Min 3X/week      PT Plan Current plan remains appropriate    Co-evaluation              AM-PAC PT "6 Clicks" Mobility   Outcome Measure  Help needed turning from your back to your side while in a flat bed without using bedrails?: None Help needed moving from lying on your back to sitting on the side of a flat bed without using bedrails?: None Help needed moving to and from a bed to a chair (including a wheelchair)?: None Help needed standing up from a chair using your arms (e.g., wheelchair or bedside chair)?: A Little Help needed to walk in hospital room?: A Little Help needed climbing 3-5 steps with a railing? : A Little 6 Click Score: 21    End  of Session Equipment Utilized During Treatment: Oxygen;Gait belt Activity Tolerance: Patient tolerated treatment well Patient left: in bed;with call bell/phone within reach Nurse Communication: Mobility status PT Visit Diagnosis: Unsteadiness on feet (R26.81);Difficulty in walking, not elsewhere classified (R26.2)     Time: [id number] PT Time Calculation (min) (ACUTE ONLY): 25 min  Charges:  $Gait Training: 8-22 mins $Therapeutic Activity: 8-22 mins                     Rica Koyanagi  PTA Acute  Rehabilitation Services Pager      [phone] Office      [phone]

## 2019-09-18 LAB — BASIC METABOLIC PANEL
Anion gap: 7 (ref 5–15)
BUN: 8 mg/dL (ref 8–23)
CO2: 45 mmol/L — ABNORMAL HIGH (ref 22–32)
Calcium: 8.7 mg/dL — ABNORMAL LOW (ref 8.9–10.3)
Chloride: 86 mmol/L — ABNORMAL LOW (ref 98–111)
Creatinine, Ser: 0.45 mg/dL (ref 0.44–1.00)
GFR calc Af Amer: >60 mL/min (ref >60)
GFR calc non Af Amer: >60 mL/min (ref >60)
Glucose, Bld: 86 mg/dL (ref 70–99)
Potassium: 3.3 mmol/L — ABNORMAL LOW (ref 3.5–5.1)
Sodium: 138 mmol/L (ref 135–145)

## 2019-09-18 LAB — CBC
HCT: 38.3 % (ref 36.0–46.0)
Hemoglobin: 11.7 g/dL — ABNORMAL LOW (ref 12.0–15.0)
MCH: 28.1 pg (ref 26.0–34.0)
MCHC: 30.5 g/dL (ref 30.0–36.0)
MCV: 91.8 fL (ref 80.0–100.0)
Platelets: 446 10*3/uL — ABNORMAL HIGH (ref 150–400)
RBC: 4.17 MIL/uL (ref 3.87–5.11)
RDW: 14.6 % (ref 11.5–15.5)
WBC: 10.3 10*3/uL (ref 4.0–10.5)
nRBC: 0 % (ref 0.0–0.2)

## 2019-09-18 LAB — MAGNESIUM: Magnesium: 1.7 mg/dL (ref 1.7–2.4)

## 2019-09-18 MED ORDER — MAGNESIUM OXIDE 400 (241.3 MG) MG PO TABS
200.0000 mg | ORAL_TABLET | Freq: Two times a day (BID) | ORAL | Status: DC
  Administered 2019-09-18 – 2019-09-19 (×3): 200 mg via ORAL
  Filled 2019-09-18 (×3): qty 1

## 2019-09-18 MED ORDER — POTASSIUM CHLORIDE CRYS ER 20 MEQ PO TBCR
20.0000 meq | EXTENDED_RELEASE_TABLET | Freq: Once | ORAL | Status: AC
  Administered 2019-09-18: 20 meq via ORAL
  Filled 2019-09-18: qty 1

## 2019-09-18 MED ORDER — POTASSIUM CHLORIDE 20 MEQ PO PACK
20.0000 meq | PACK | Freq: Once | ORAL | Status: AC
  Administered 2019-09-18: 20 meq via ORAL
  Filled 2019-09-18: qty 1

## 2019-09-18 MED ORDER — FLUCONAZOLE 100 MG PO TABS
200.0000 mg | ORAL_TABLET | Freq: Every day | ORAL | Status: DC
  Administered 2019-09-19: 200 mg via ORAL
  Filled 2019-09-18: qty 2

## 2019-09-18 NOTE — Progress Notes (Signed)
PROGRESS NOTE    Claudia Wiley    Code Status: Full Code  [id number] DOB: 04-16-1956 DOA: 09/09/2019 LOS: 9 days  PCP: System, Pcp Not In CC: No chief complaint on file.      Hospital Summary   63 year old female with history of esophageal stricture and dilatation 01/2018, gastrinoma status post Whipple's procedure, depression recently moved from [place] in 2018 who was admitted with intractable nausea and vomiting.  S/p EGD 2/27: Severe mucosal changes in the entire esophagus with circumferential plaque appearing mucosa suspicious for acute esophageal necrosis, transition to normal tissue at the GE junction, nonbleeding duodenal ulcers.  3/3 EGD culture growing unknown fungus and bacteria.  Special stains to further evaluate fungal infection  A & P   Active Problems:   COPD with acute exacerbation (HCC)   Respiratory failure (HCC)   Acute esophagitis   Esophageal dysphagia   Esophageal stenosis   Odynophagia   Leukocytosis   1. Severe esophagitis, odynophagia, improving a. Esophageal biopsy showing inflammatory exudate and necrotic debris with fungal and bacterial organisms present, special stains being performed to better characterize infections b. Received 7 days empiric Flagyl and 6 days cefepime. Afebrile however increased leukocytosis. Off antibiotics now c. Per GI: Continue Protonix twice daily long term, sucralfate 4 times daily x1 month, viscous lidocaine x 1 month, Fluconazole 200 mg daily x 14 days, advance to full liquid diet.  1 month follow-up outpatient.  GI signing off d. Will change IV antifungal to p.o. per pharmacy consult to limit volume 2. Duodenal ulcers a. Biopsy with erosion improving gland hyperplasia, gastric biopsy with hyperemia and slight chronic inflammation, negative for H. pylori b. Plan as above 3. Acute on chronic hypoxic and hypercarbic respiratory failure likely secondary to volume overload with possible COPD exacerbation a. Wears 2 L/min [place]  as needed/on exertion at baseline, currently requiring continuous.  SpO2 86% on room air at rest today b. Echo 3/4: EF [id number] without systolic or diastolic dysfunction noted c. Received large volumes of IV fluids d. Weight improved and below admission weight e. Still positive volume f. Hold further IV fluids now that she is tolerating CLD g. Continue Lasix 60 mg IV twice daily h. Continue DuoNebs i. Likely needs 1 more day of IV diuresis 4. Anxiety and depression a. Continue Xanax and Elavil 5. 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 Continue pain medications b. tylenol for headache 6. Leukocytosis resolved 7. Hypokalemia a. Give p.o. today b. Monitor while on lasix 8. Low BMI a. Body mass index is 15.69 kg/m.  b. Advise increased PO intake when able   DVT prophylaxis: Lovenox, SCDs Family Communication: discussed with daughter over the phone Disposition Plan:   Patient came from:   Home                                                                                          Anticipated d/c place: Home with home health  Barriers to d/c: Still requires IV diuresis, likely discharge tomorrow morning.  May need O2 at discharge, will ask TOC to arrange  Pressure injury documentation    None  Consultants  GI  Procedures  EGD 2/27  Antibiotics   Anti-infectives (From admission, onward)   Start     Dose/Rate Route Frequency Ordered Stop   09/15/19 1600  fluconazole (DIFLUCAN) IVPB 200 mg  Status:  Discontinued     200 mg 100 mL/hr over 60 Minutes Intravenous Daily 09/15/19 1447 09/18/19 1048   09/10/19 1400  metroNIDAZOLE (FLAGYL) IVPB 500 mg  Status:  Discontinued     500 mg 100 mL/hr over 60 Minutes Intravenous Every 8 hours 09/10/19 1305 09/16/19 1133   09/09/19 2200  ceFEPIme (MAXIPIME) 2 g in sodium chloride 0.9 % 100 mL IVPB     2 g 200 mL/hr over 30 Minutes Intravenous 2 times daily 09/09/19 2119 09/14/19 0905        Subjective    Seen and examined at bedside no acute distress resting comfortably.  States her breathing is much better than when she came in but she is still requiring nasal cannula at rest.  No complaints at this time.  Objective   Vitals:   09/18/19 0539 09/18/19 0746 09/18/19 1010 09/18/19 1049  BP: 126/87     Pulse: (!) 110     Resp: 16     Temp: 98.1 F (36.7 C)     TempSrc: Oral     SpO2: 95% 98% 95% (!) 85%  Weight: 35.2 kg     Height:        Intake/Output Summary (Last 24 hours) at 09/18/2019 1051 Last data filed at 09/18/2019 1000 Gross per 24 hour  Intake 470 ml  Output 1900 ml  Net -1430 ml   Filed Weights   09/12/19 0400 09/17/19 0456 09/18/19 0539  Weight: 41.5 kg 37.3 kg 35.2 kg    Examination:  Physical Exam Vitals and nursing note reviewed.  Constitutional:      Appearance: Normal appearance.  HENT:     Head: Normocephalic and atraumatic.  Eyes:     Conjunctiva/sclera: Conjunctivae normal.  Cardiovascular:     Rate and Rhythm: Normal rate and regular rhythm.  Pulmonary:     Effort: Pulmonary effort is normal.     Comments: Mild right basilar rales Abdominal:     General: Abdomen is flat.     Palpations: Abdomen is soft.  Musculoskeletal:        General: No swelling or tenderness.  Skin:    Coloration: Skin is not jaundiced or pale.  Neurological:     Mental Status: She is alert. Mental status is at baseline.  Psychiatric:        Mood and Affect: Mood normal.        Behavior: Behavior normal.     Data Reviewed: I have personally reviewed following labs and imaging studies  CBC: Recent Labs  Lab 09/14/19 0418 09/15/19 0429 09/16/19 0430 09/17/19 0529 09/18/19 0336  WBC 12.2* 10.1 12.4* 10.0 10.3  HGB 10.6* 11.3* 11.0* 12.1 11.7*  HCT 34.2* 36.5 35.1* 39.2 38.3  MCV 92.9 91.9 91.4 91.4 91.8  PLT 227 278 303 418* [id number]*   Basic Metabolic Panel: Recent Labs  Lab 09/13/19 0700 09/14/19 0418 09/15/19 0429 09/16/19 0430 09/16/19 1304  09/17/19 0529 09/18/19 0336  NA  --    < > 141 138 139 137 138  K  --    < > 3.3* 2.9* 3.7 3.3* 3.3*  CL  --    < > 98 84* 90* 85* 86*  CO2  --    < > 38* 44* 42*  42* 45*  GLUCOSE  --    < > 75 86 86 81 86  BUN  --    < > 10 8 9  7* 8  CREATININE  --    < > 0.31* 0.32* 0.35* 0.38* 0.45  CALCIUM  --    < > 8.3* 8.1* 8.1* 8.7* 8.7*  MG 2.2  --   --  1.4*  --  2.1 1.7   < > = values in this interval not displayed.   GFR: Estimated Creatinine Clearance: 41 mL/min (by C-G formula based on SCr of 0.45 mg/dL). Liver Function Tests: Recent Labs  Lab 09/12/19 0325 09/13/19 0517 09/14/19 0418 09/15/19 0429  AST 29 25 20 22   ALT 18 17 18 20   ALKPHOS 59 60 47 55  BILITOT 0.6 0.2* 0.3 0.3  PROT 5.3* 5.2* 4.4* 4.9*  ALBUMIN 2.7* 2.6* 2.3* 2.5*   No results for input(s): LIPASE, AMYLASE in the last 168 hours. No results for input(s): AMMONIA in the last 168 hours. Coagulation Profile: No results for input(s): INR, PROTIME in the last 168 hours. Cardiac Enzymes: No results for input(s): CKTOTAL, CKMB, CKMBINDEX, TROPONINI in the last 168 hours. BNP (last 3 results) No results for input(s): PROBNP in the last 8760 hours. HbA1C: No results for input(s): HGBA1C in the last 72 hours. CBG: No results for input(s): GLUCAP in the last 168 hours. Lipid Profile: No results for input(s): CHOL, HDL, LDLCALC, TRIG, CHOLHDL, LDLDIRECT in the last 72 hours. Thyroid Function Tests: No results for input(s): TSH, T4TOTAL, FREET4, T3FREE, THYROIDAB in the last 72 hours. Anemia Panel: No results for input(s): VITAMINB12, FOLATE, FERRITIN, TIBC, IRON, RETICCTPCT in the last 72 hours. Sepsis Labs: No results for input(s): PROCALCITON, LATICACIDVEN in the last 168 hours.  Recent Results (from the past 240 hour(s))  Respiratory Panel by RT PCR (Flu A&B, Covid) - Nasopharyngeal Swab     Status: None   Collection Time: 09/09/19  3:08 PM   Specimen: Nasopharyngeal Swab  Result Value Ref Range Status    SARS Coronavirus 2 by RT PCR NEGATIVE NEGATIVE Final    Comment: (NOTE) SARS-CoV-2 target nucleic acids are NOT DETECTED. The SARS-CoV-2 RNA is generally detectable in upper respiratoy specimens during the acute phase of infection. The lowest concentration of SARS-CoV-2 viral copies this assay can detect is 131 copies/mL. A negative result does not preclude SARS-Cov-2 infection and should not be used as the sole basis for treatment or other patient management decisions. A negative result may occur with  improper specimen collection/handling, submission of specimen other than nasopharyngeal swab, presence of viral mutation(s) within the areas targeted by this assay, and inadequate number of viral copies (<131 copies/mL). A negative result must be combined with clinical observations, patient history, and epidemiological information. The expected result is Negative. Fact Sheet for Patients:  PinkCheek.be Fact Sheet for Healthcare Providers:  GravelBags.it This test is not yet ap proved or cleared by the [place] FDA and  has been authorized for detection and/or diagnosis of SARS-CoV-2 by FDA under an Emergency Use Authorization (EUA). This EUA will remain  in effect (meaning this test can be used) for the duration of the COVID-19 declaration under Section 564(b)(1) of the Act, 21 U.S.C. section 360bbb-3(b)(1), unless the authorization is terminated or revoked sooner.    Influenza A by PCR NEGATIVE NEGATIVE Final   Influenza B by PCR NEGATIVE NEGATIVE Final    Comment: (NOTE) The Xpert Xpress SARS-CoV-2/FLU/RSV assay is intended as an aid in  the diagnosis of influenza from Nasopharyngeal swab specimens and  should not be used as a sole basis for treatment. Nasal washings and  aspirates are unacceptable for Xpert Xpress SARS-CoV-2/FLU/RSV  testing. Fact Sheet for Patients: PinkCheek.be Fact  Sheet for Healthcare Providers: GravelBags.it This test is not yet approved or cleared by the [place] FDA and  has been authorized for detection and/or diagnosis of SARS-CoV-2 by  FDA under an Emergency Use Authorization (EUA). This EUA will remain  in effect (meaning this test can be used) for the duration of the  Covid-19 declaration under Section 564(b)(1) of the Act, 21  U.S.C. section 360bbb-3(b)(1), unless the authorization is  terminated or revoked. Performed at [facility], [place] [address], [place], [place] [address]   MRSA PCR Screening     Status: None   Collection Time: 09/09/19  9:22 PM   Specimen: Nasopharyngeal  Result Value Ref Range Status   MRSA by PCR NEGATIVE NEGATIVE Final    Comment:        The GeneXpert MRSA Assay (FDA approved for NASAL specimens only), is one component of a comprehensive MRSA colonization surveillance program. It is not intended to diagnose MRSA infection nor to guide or monitor treatment for MRSA infections. Performed at [facility], [place] [address] [place],  [address]          Radiology Studies: ECHOCARDIOGRAM COMPLETE  Result Date: 09/16/2019    ECHOCARDIOGRAM REPORT   Patient Name:   DONNAMARIA KOFFLER Date of Exam: 09/16/2019 Medical Rec #:  [id number]   Height:       59.0 in Accession #:    [id number]  Weight:       91.5 lb Date of Birth:  Apr 01, 1956   BSA:          1.323 m Patient Age:    22 years    BP:           96/89 mmHg Patient Gender: F           HR:           85 bpm. Exam Location:  Inpatient Procedure: 2D Echo Indications:    Dyspnea R06.00  History:        Patient has no prior history of Echocardiogram examinations.                 COPD; Risk Factors:Hypertension and Diabetes.  Sonographer:    Mikki Santee RDCS (AE) Referring Phys: [id number] [place] IMPRESSIONS  1. Left ventricular ejection fraction, by estimation, is 60 to 65%. The left  ventricle has normal function. The left ventricle has no regional wall motion abnormalities. Left ventricular diastolic parameters were normal.  2. Right ventricular systolic function is normal. The right ventricular size is normal.  3. The mitral valve is normal in structure and function. Mild mitral valve regurgitation. No evidence of mitral stenosis.  4. The aortic valve is normal in structure and function. Aortic valve regurgitation is not visualized. No aortic stenosis is present.  5. The inferior vena cava is normal in size with greater than 50% respiratory variability, suggesting right atrial pressure of 3 mmHg. FINDINGS  Left Ventricle: Left ventricular ejection fraction, by estimation, is 60 to 65%. The left ventricle has normal function. The left ventricle has no regional wall motion abnormalities. The left ventricular internal cavity size was normal in size. There is  no left ventricular hypertrophy. Left ventricular diastolic parameters were normal. Right Ventricle: The right ventricular  size is normal. No increase in right ventricular wall thickness. Right ventricular systolic function is normal. Left Atrium: Left atrial size was normal in size. Right Atrium: Right atrial size was normal in size. Pericardium: There is no evidence of pericardial effusion. Mitral Valve: The mitral valve is normal in structure and function. Normal mobility of the mitral valve leaflets. Mild mitral valve regurgitation. No evidence of mitral valve stenosis. Tricuspid Valve: The tricuspid valve is normal in structure. Tricuspid valve regurgitation is not demonstrated. No evidence of tricuspid stenosis. Aortic Valve: The aortic valve is normal in structure and function. Aortic valve regurgitation is not visualized. No aortic stenosis is present. Pulmonic Valve: The pulmonic valve was normal in structure. Pulmonic valve regurgitation is not visualized. No evidence of pulmonic stenosis. Aorta: The aortic root is normal in size  and structure. Venous: The inferior vena cava is normal in size with greater than 50% respiratory variability, suggesting right atrial pressure of 3 mmHg. IAS/Shunts: No atrial level shunt detected by color flow Doppler.  LEFT VENTRICLE PLAX 2D LVIDd:         3.80 cm  Diastology LVIDs:         2.40 cm  LV e' lateral:   8.05 cm/s LV PW:         0.80 cm  LV E/e' lateral: 7.0 LV IVS:        0.80 cm  LV e' medial:    6.96 cm/s LVOT diam:     1.70 cm  LV E/e' medial:  8.1 LV SV:         28 LV SV Index:   21 LVOT Area:     2.27 cm  RIGHT VENTRICLE RV S prime:     11.00 cm/s TAPSE (M-mode): 1.4 cm LEFT ATRIUM             Index       RIGHT ATRIUM           Index LA diam:        1.70 cm 1.29 cm/m  RA Area:     11.60 cm LA Vol (A2C):   17.4 ml 13.15 ml/m RA Volume:   28.30 ml  21.39 ml/m LA Vol (A4C):   9.0 ml  6.82 ml/m LA Biplane Vol: 12.4 ml 9.37 ml/m  AORTIC VALVE LVOT Vmax:   73.30 cm/s LVOT Vmean:  45.800 cm/s LVOT VTI:    0.125 m  AORTA Ao Root diam: 2.70 cm MITRAL VALVE MV Area (PHT): 3.74 cm    SHUNTS MV Decel Time: 203 msec    Systemic VTI:  0.12 m MV E velocity: 56.30 cm/s  Systemic Diam: 1.70 cm MV A velocity: 58.50 cm/s MV E/A ratio:  0.96 Mihai Croitoru MD Electronically signed by Sanda Klein MD Signature Date/Time: 09/16/2019/3:46:29 PM    Final         Scheduled Meds: . amitriptyline  10 mg Oral QHS  . docusate sodium  200 mg Oral BID  . enoxaparin (LOVENOX) injection  20 mg Subcutaneous Q24H  . feeding supplement (ENSURE ENLIVE)  237 mL Oral TID BM  . fluticasone  2 spray Each Nare Daily  . furosemide  60 mg Intravenous BID  . guaiFENesin  600 mg Oral BID  . ipratropium-albuterol  3 mL Nebulization Once  . ipratropium-albuterol  3 mL Nebulization TID  . lidocaine  15 mL Mouth/Throat TID AC  . lipase/protease/amylase  24,000 Units Oral TID WC & HS  . magnesium oxide  200 mg  Oral BID  . mouth rinse  15 mL Mouth Rinse BID  . mirtazapine  15 mg Oral QHS  . oxyCODONE  5 mg Oral BID   . pantoprazole (PROTONIX) IV  40 mg Intravenous Q12H  . senna  2 tablet Oral QHS  . sodium chloride flush  10-40 mL Intracatheter Q12H  . sucralfate  1 g Oral Q6H   Continuous Infusions:    Time spent: 15 minutes with over 50% of the time coordinating the patient's care    Harold Hedge, DO [facility] Pager [phone]  Call night coverage person covering after 7pm

## 2019-09-18 NOTE — Progress Notes (Signed)
Pharmacy Note  Claudia Wiley is a 64 y.o. female admitted on 09/09/2019 with severe esophagitis. Pharmacy has been consulted to convert IV fluconazole to PO.   Plan:  Fluconazole 200mg  PO daily-limit to 10 more days to complete 14 day course per GI recs (discussed with Dr. Neysa Bonito).   Need for further dosage adjustment appears unlikely at present, so pharmacy will sign off at this time.  Please reconsult if a change in clinical status warrants re-evaluation of dosage.  Height: 4\' 11"  (149.9 cm) Weight: 77 lb 11.2 oz (35.2 kg) IBW/kg (Calculated) : 43.2  Temp (24hrs), Avg:98.5 F (36.9 C), Min:98 F (36.7 C), Max:99.3 F (37.4 C)  Recent Labs  Lab 09/14/19 0418 09/14/19 0418 09/15/19 0429 09/16/19 0430 09/16/19 1304 09/17/19 0529 09/18/19 0336  WBC 12.2*  --  10.1 12.4*  --  10.0 10.3  CREATININE <0.30*   < > 0.31* 0.32* 0.35* 0.38* 0.45   < > = values in this interval not displayed.    Estimated Creatinine Clearance: 41 mL/min (by C-G formula based on SCr of 0.45 mg/dL).    Allergies  Allergen Reactions  . Azithromycin   . Gabapentin     Thank you for allowing pharmacy to be a part of this patient's care.  Luiz Ochoa 09/18/2019 11:30 AM

## 2019-09-19 LAB — BASIC METABOLIC PANEL
Anion gap: 7 (ref 5–15)
BUN: 13 mg/dL (ref 8–23)
CO2: 40 mmol/L — ABNORMAL HIGH (ref 22–32)
Calcium: 9.2 mg/dL (ref 8.9–10.3)
Chloride: 92 mmol/L — ABNORMAL LOW (ref 98–111)
Creatinine, Ser: 0.48 mg/dL (ref 0.44–1.00)
GFR calc Af Amer: >60 mL/min (ref >60)
GFR calc non Af Amer: >60 mL/min (ref >60)
Glucose, Bld: 106 mg/dL — ABNORMAL HIGH (ref 70–99)
Potassium: 3.6 mmol/L (ref 3.5–5.1)
Sodium: 139 mmol/L (ref 135–145)

## 2019-09-19 LAB — MAGNESIUM: Magnesium: 1.7 mg/dL (ref 1.7–2.4)

## 2019-09-19 MED ORDER — ALPRAZOLAM 0.5 MG PO TABS: 0.5000 mg | ORAL_TABLET | Freq: Every evening | ORAL | 0 refills | Status: AC | PRN

## 2019-09-19 MED ORDER — DOCUSATE SODIUM 100 MG PO CAPS: 200.0000 mg | ORAL_CAPSULE | Freq: Two times a day (BID) | ORAL | 0 refills | Status: DC

## 2019-09-19 MED ORDER — OXYCODONE HCL 5 MG PO TABS: 5.0000 mg | ORAL_TABLET | Freq: Two times a day (BID) | ORAL | 0 refills | Status: AC | PRN

## 2019-09-19 MED ORDER — SUCRALFATE 1 GM/10ML PO SUSP: 1.0000 g | Freq: Four times a day (QID) | ORAL | 0 refills | Status: DC

## 2019-09-19 MED ORDER — LIDOCAINE VISCOUS HCL 2 % MT SOLN: 15.0000 mL | Freq: Three times a day (TID) | OROMUCOSAL | 0 refills | Status: DC

## 2019-09-19 MED ORDER — FLUCONAZOLE 200 MG PO TABS: 200.0000 mg | ORAL_TABLET | Freq: Every day | ORAL | 0 refills | Status: AC

## 2019-09-19 MED ORDER — PANTOPRAZOLE SODIUM 40 MG PO TBEC: DELAYED_RELEASE_TABLET | ORAL | 0 refills | Status: DC

## 2019-09-19 MED ORDER — FLUCONAZOLE 200 MG PO TABS: 200.0000 mg | ORAL_TABLET | Freq: Every day | ORAL | 0 refills | Status: DC

## 2019-09-19 NOTE — Discharge Summary (Addendum)
Physician Discharge Summary  Claudia Wiley [id number] DOB: 04/05/1956   PCP: System, Pcp Not In  Admit date: 09/09/2019 Discharge date: 09/19/2019 Length of Stay: 10 days   Code Status: Full Code  Admitted From:  Home Discharged to:   [place]: PT/OT  Equipment/Devices: Has home O2, discharged at baseline, 3 in 1 arranged Discharge Condition:  Stable  Recommendations for Outpatient Follow-up   1. Follow with GI 2. Continue full liquid diet for now until cleared outpatient to advance diet 3. Discharged with a limited supply of Oxycodone and Benzo, she is to request refills at [facility] Summary  64 year old female with history of COPD on 2L [place], on exertion with history of lung cancer with LUL lobectomy, esophageal stricture and dilatation 01/2018, gastrinoma status post Whipple's procedure, depression recently moved from [place] in 2018 who was admitted with intractable nausea and vomiting.  Noted to have acute hypoxic and hypercarbic respiratory failure thought secondary to COPD exacerbation and was on tapering dose of steroids and finished her steroids while hospitalized.  Continued on Spiriva and duo nebs.  S/p EGD 2/27: Severe mucosal changes in the entire esophagus with circumferential plaque appearing mucosa suspicious for acute esophageal necrosis, transition to normal tissue at the GE junction, nonbleeding duodenal ulcers.  3/3 EGD culture growing unknown fungus and bacteria.  Special stains to further evaluate fungal infection indicating Candida  Had prolonged hospitalization due to prolonged hypoxic respiratory failure found to be volume overloaded due to high volumes of IV fluids given during beginning of hospital stay.  She required several days of IV diuresis.  Echo was unremarkable.  Had significant improvement and returned to baseline oxygen requirements.  Discharged in stable condition on fluconazole p.o. for total 14 days, Carafate x1 month, lidocaine solution  x1 month and Protonix twice daily x1 month followed by once daily with GI follow-up.  A & P   Active Problems:   COPD with acute exacerbation (HCC)   Respiratory failure (HCC)   Acute esophagitis   Esophageal dysphagia   Esophageal stenosis   Odynophagia   Leukocytosis   1. Severe Candida esophagitis, odynophagia, improving a. Esophageal biopsy showing inflammatory exudate and necrotic debris with fungal and bacterial organisms present b. Completed 7 days empiric Flagyl and 6 days cefepime c. Per GI: Continue Protonix twice daily long term, sucralfate 4 times daily x1 month, viscous lidocaine x 1 month, Fluconazole 200 mg daily x 14 days (completed 6 days treatment in hospital), advance to full liquid diet.  1 month follow-up outpatient 2. Duodenal ulcers a. Biopsy with erosion improving gland hyperplasia, gastric biopsy with hyperemia and slight chronic inflammation, negative for H. pylori b. Plan as above 3. Acute on chronic hypoxic and hypercarbic respiratory failure likely secondary to volume overload with possible COPD exacerbation a. Received large volumes of IV fluids at the beginning of hospitalization, weight 36.3 kg on admission->>> 41.5 kg>>>> 34.4 kg after diuresis b. At baseline 2 L/min nasal cannula on exertion, room air at rest c. Echo 3/4: EF [id number] without systolic or diastolic dysfunction noted 4. Anxiety and depression a. Continue Xanax and Elavil 5. 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 Continue pain medications b. tylenol for headache 6. Leukocytosis resolved 7. Hypokalemia resolved 8. Low BMI a. Body mass index is 15.69 kg/m.  b. Advise increased PO intake when able    Consultants  . GI  Procedures  . 3/3 EGD  Antibiotics   Anti-infectives (From admission, onward)  Start     Dose/Rate Route Frequency Ordered Stop   09/19/19 1000  fluconazole (DIFLUCAN) tablet 200 mg     200 mg Oral Daily 09/18/19 1129 09/29/19 0959    09/19/19 0000  fluconazole (DIFLUCAN) 200 MG tablet  Status:  Discontinued     200 mg Oral Daily 09/19/19 0927 09/19/19    09/19/19 0000  fluconazole (DIFLUCAN) 200 MG tablet     200 mg Oral Daily 09/19/19 1334 09/27/19 2359   09/15/19 1600  fluconazole (DIFLUCAN) IVPB 200 mg  Status:  Discontinued     200 mg 100 mL/hr over 60 Minutes Intravenous Daily 09/15/19 1447 09/18/19 1048   09/10/19 1400  metroNIDAZOLE (FLAGYL) IVPB 500 mg  Status:  Discontinued     500 mg 100 mL/hr over 60 Minutes Intravenous Every 8 hours 09/10/19 1305 09/16/19 1133   09/09/19 2200  ceFEPIme (MAXIPIME) 2 g in sodium chloride 0.9 % 100 mL IVPB     2 g 200 mL/hr over 30 Minutes Intravenous 2 times daily 09/09/19 2119 09/14/19 0905       Subjective  Patient seen and examined at bedside no acute distress and resting comfortably.  No events overnight.  Tolerating diet. In good spirits and anticipating discharge.   Denies any chest pain, shortness of breath, fever, nausea, vomiting, urinary or bowel complaints. Otherwise ROS negative    Objective   Discharge Exam: Vitals:   09/19/19 0743 09/19/19 1212  BP:  117/81  Pulse:  (!) 102  Resp:  16  Temp:  98.6 F (37 C)  SpO2: 97% 99%   Vitals:   09/19/19 0734 09/19/19 0736 09/19/19 0743 09/19/19 1212  BP:    117/81  Pulse:    (!) 102  Resp:    16  Temp:    98.6 F (37 C)  TempSrc:    Oral  SpO2: 97% 93% 97% 99%  Weight:      Height:        Physical Exam Vitals and nursing note reviewed.  Constitutional:      General: She is not in acute distress.    Comments: Frail  HENT:     Head: Normocephalic and atraumatic.  Eyes:     Conjunctiva/sclera: Conjunctivae normal.  Cardiovascular:     Rate and Rhythm: Normal rate and regular rhythm.  Pulmonary:     Effort: Pulmonary effort is normal.     Breath sounds: Normal breath sounds.  Abdominal:     General: Abdomen is flat.     Palpations: Abdomen is soft.  Musculoskeletal:        General: No  swelling or tenderness.  Skin:    Coloration: Skin is not jaundiced or pale.  Neurological:     Mental Status: She is alert. Mental status is at baseline.  Psychiatric:        Mood and Affect: Mood normal.        Behavior: Behavior normal.       The results of significant diagnostics from this hospitalization (including imaging, microbiology, ancillary and laboratory) are listed below for reference.     Microbiology: Recent Results (from the past 240 hour(s))  Respiratory Panel by RT PCR (Flu A&B, Covid) - Nasopharyngeal Swab     Status: None   Collection Time: 09/09/19  3:08 PM   Specimen: Nasopharyngeal Swab  Result Value Ref Range Status   SARS Coronavirus 2 by RT PCR NEGATIVE NEGATIVE Final    Comment: (NOTE) SARS-CoV-2 target nucleic acids are NOT  DETECTED. The SARS-CoV-2 RNA is generally detectable in upper respiratoy specimens during the acute phase of infection. The lowest concentration of SARS-CoV-2 viral copies this assay can detect is 131 copies/mL. A negative result does not preclude SARS-Cov-2 infection and should not be used as the sole basis for treatment or other patient management decisions. A negative result may occur with  improper specimen collection/handling, submission of specimen other than nasopharyngeal swab, presence of viral mutation(s) within the areas targeted by this assay, and inadequate number of viral copies (<131 copies/mL). A negative result must be combined with clinical observations, patient history, and epidemiological information. The expected result is Negative. Fact Sheet for Patients:  PinkCheek.be Fact Sheet for Healthcare Providers:  GravelBags.it This test is not yet ap proved or cleared by the [place] FDA and  has been authorized for detection and/or diagnosis of SARS-CoV-2 by FDA under an Emergency Use Authorization (EUA). This EUA will remain  in effect (meaning  this test can be used) for the duration of the COVID-19 declaration under Section 564(b)(1) of the Act, 21 U.S.C. section 360bbb-3(b)(1), unless the authorization is terminated or revoked sooner.    Influenza A by PCR NEGATIVE NEGATIVE Final   Influenza B by PCR NEGATIVE NEGATIVE Final    Comment: (NOTE) The Xpert Xpress SARS-CoV-2/FLU/RSV assay is intended as an aid in  the diagnosis of influenza from Nasopharyngeal swab specimens and  should not be used as a sole basis for treatment. Nasal washings and  aspirates are unacceptable for Xpert Xpress SARS-CoV-2/FLU/RSV  testing. Fact Sheet for Patients: PinkCheek.be Fact Sheet for Healthcare Providers: GravelBags.it This test is not yet approved or cleared by the [place] FDA and  has been authorized for detection and/or diagnosis of SARS-CoV-2 by  FDA under an Emergency Use Authorization (EUA). This EUA will remain  in effect (meaning this test can be used) for the duration of the  Covid-19 declaration under Section 564(b)(1) of the Act, 21  U.S.C. section 360bbb-3(b)(1), unless the authorization is  terminated or revoked. Performed at [facility], [place] [address], [place], [place] [address]   MRSA PCR Screening     Status: None   Collection Time: 09/09/19  9:22 PM   Specimen: Nasopharyngeal  Result Value Ref Range Status   MRSA by PCR NEGATIVE NEGATIVE Final    Comment:        The GeneXpert MRSA Assay (FDA approved for NASAL specimens only), is one component of a comprehensive MRSA colonization surveillance program. It is not intended to diagnose MRSA infection nor to guide or monitor treatment for MRSA infections. Performed at [facility], [place] [address], [place], [place] [address]      Labs: BNP (last 3 results) Recent Labs    09/09/19 1507  BNP [id number]   Basic Metabolic Panel: Recent Labs  Lab 09/13/19 0700  09/14/19 0418 09/16/19 0430 09/16/19 1304 09/17/19 0529 09/18/19 0336 09/19/19 0430  NA  --    < > 138 139 137 138 139  K  --    < > 2.9* 3.7 3.3* 3.3* 3.6  CL  --    < > 84* 90* 85* 86* 92*  CO2  --    < > 44* 42* 42* 45* 40*  GLUCOSE  --    < > 86 86 81 86 106*  BUN  --    < > 8 9 7* 8 13  CREATININE  --    < > 0.32* 0.35* 0.38* 0.45  0.48  CALCIUM  --    < > 8.1* 8.1* 8.7* 8.7* 9.2  MG 2.2  --  1.4*  --  2.1 1.7 1.7   < > = values in this interval not displayed.   Liver Function Tests: Recent Labs  Lab 09/13/19 0517 09/14/19 0418 09/15/19 0429  AST 25 20 22   ALT 17 18 20   ALKPHOS 60 47 55  BILITOT 0.2* 0.3 0.3  PROT 5.2* 4.4* 4.9*  ALBUMIN 2.6* 2.3* 2.5*   No results for input(s): LIPASE, AMYLASE in the last 168 hours. No results for input(s): AMMONIA in the last 168 hours. CBC: Recent Labs  Lab 09/14/19 0418 09/15/19 0429 09/16/19 0430 09/17/19 0529 09/18/19 0336  WBC 12.2* 10.1 12.4* 10.0 10.3  HGB 10.6* 11.3* 11.0* 12.1 11.7*  HCT 34.2* 36.5 35.1* 39.2 38.3  MCV 92.9 91.9 91.4 91.4 91.8  PLT 227 278 303 418* 446*   Cardiac Enzymes: No results for input(s): CKTOTAL, CKMB, CKMBINDEX, TROPONINI in the last 168 hours. BNP: Invalid input(s): POCBNP CBG: No results for input(s): GLUCAP in the last 168 hours. D-Dimer No results for input(s): DDIMER in the last 72 hours. Hgb A1c No results for input(s): HGBA1C in the last 72 hours. Lipid Profile No results for input(s): CHOL, HDL, LDLCALC, TRIG, CHOLHDL, LDLDIRECT in the last 72 hours. Thyroid function studies No results for input(s): TSH, T4TOTAL, T3FREE, THYROIDAB in the last 72 hours.  Invalid input(s): FREET3 Anemia work up No results for input(s): VITAMINB12, FOLATE, FERRITIN, TIBC, IRON, RETICCTPCT in the last 72 hours. Urinalysis    Component Value Date/Time   COLORURINE YELLOW 09/10/2019 1750   APPEARANCEUR CLEAR 09/10/2019 1750   LABSPEC 1.024 09/10/2019 1750   PHURINE 7.0 09/10/2019 1750    GLUCOSEU NEGATIVE 09/10/2019 1750   HGBUR NEGATIVE 09/10/2019 1750   BILIRUBINUR NEGATIVE 09/10/2019 1750   KETONESUR 20 (A) 09/10/2019 1750   PROTEINUR NEGATIVE 09/10/2019 1750   UROBILINOGEN 0.2 12/27/2012 0454   NITRITE NEGATIVE 09/10/2019 1750   LEUKOCYTESUR NEGATIVE 09/10/2019 1750   Sepsis Labs Invalid input(s): PROCALCITONIN,  WBC,  LACTICIDVEN Microbiology Recent Results (from the past 240 hour(s))  Respiratory Panel by RT PCR (Flu A&B, Covid) - Nasopharyngeal Swab     Status: None   Collection Time: 09/09/19  3:08 PM   Specimen: Nasopharyngeal Swab  Result Value Ref Range Status   SARS Coronavirus 2 by RT PCR NEGATIVE NEGATIVE Final    Comment: (NOTE) SARS-CoV-2 target nucleic acids are NOT DETECTED. The SARS-CoV-2 RNA is generally detectable in upper respiratoy specimens during the acute phase of infection. The lowest concentration of SARS-CoV-2 viral copies this assay can detect is 131 copies/mL. A negative result does not preclude SARS-Cov-2 infection and should not be used as the sole basis for treatment or other patient management decisions. A negative result may occur with  improper specimen collection/handling, submission of specimen other than nasopharyngeal swab, presence of viral mutation(s) within the areas targeted by this assay, and inadequate number of viral copies (<131 copies/mL). A negative result must be combined with clinical observations, patient history, and epidemiological information. The expected result is Negative. Fact Sheet for Patients:  PinkCheek.be Fact Sheet for Healthcare Providers:  GravelBags.it This test is not yet ap proved or cleared by the [place] FDA and  has been authorized for detection and/or diagnosis of SARS-CoV-2 by FDA under an Emergency Use Authorization (EUA). This EUA will remain  in effect (meaning this test can be used) for the duration of the  COVID-19  declaration under Section 564(b)(1) of the Act, 21 U.S.C. section 360bbb-3(b)(1), unless the authorization is terminated or revoked sooner.    Influenza A by PCR NEGATIVE NEGATIVE Final   Influenza B by PCR NEGATIVE NEGATIVE Final    Comment: (NOTE) The Xpert Xpress SARS-CoV-2/FLU/RSV assay is intended as an aid in  the diagnosis of influenza from Nasopharyngeal swab specimens and  should not be used as a sole basis for treatment. Nasal washings and  aspirates are unacceptable for Xpert Xpress SARS-CoV-2/FLU/RSV  testing. Fact Sheet for Patients: PinkCheek.be Fact Sheet for Healthcare Providers: GravelBags.it This test is not yet approved or cleared by the [place] FDA and  has been authorized for detection and/or diagnosis of SARS-CoV-2 by  FDA under an Emergency Use Authorization (EUA). This EUA will remain  in effect (meaning this test can be used) for the duration of the  Covid-19 declaration under Section 564(b)(1) of the Act, 21  U.S.C. section 360bbb-3(b)(1), unless the authorization is  terminated or revoked. Performed at [facility], [place] [address] [place], [place] [address]   MRSA PCR Screening     Status: None   Collection Time: 09/09/19  9:22 PM   Specimen: Nasopharyngeal  Result Value Ref Range Status   MRSA by PCR NEGATIVE NEGATIVE Final    Comment:        The GeneXpert MRSA Assay (FDA approved for NASAL specimens only), is one component of a comprehensive MRSA colonization surveillance program. It is not intended to diagnose MRSA infection nor to guide or monitor treatment for MRSA infections. Performed at [facility], [place] [address] [place], [place] [address]     Discharge Instructions     Discharge Instructions    Diet full liquid   Complete by: As directed    Discharge instructions   Complete by: As directed    You were seen and examined in  the hospital for an esophageal infection and also shortness of breath and cared for by a hospitalist and GI doctor  Upon Discharge:  - Take Fluconazole as prescribed to completion, do not miss doses - Take pantoprazole twice daily for the next 20 days then once daily after that - Take Carafate as prescribed four times daily for the next month - Take Lidocaine solution as prescribed for the next month - follow up with GI outpatient - follow up with the [facility] this week regarding your refills - stick to a full liquid diet for now, similar to what you have had in the hospital, until you follow up with your primary care or GI doctor - Make an appointment with your primary care physician within 7 days - Get lab work prior to your follow up appointment with your PCP Bring all home medications to your appointment to review Request that your primary physician go over all hospital tests and procedures/radiological results at the follow up.   Please get all hospital records sent to your physician by signing a hospital release before you go home.   Read the complete instructions along with all the possible side effects for all the medicines you take and that have been prescribed to you. Take any new medicines after you have completely understood and accept all the possible adverse reactions/side effects.   If you have any questions about your discharge medications or the care you received while you were in the hospital, you can call the unit and asked to speak with the hospitalist on call. Once you  are discharged, your primary care physician will handle any further medical issues. Please note that NO REFILLS for any discharge medications will be authorized, as it is imperative that you return to your primary care physician (or establish a relationship with a primary care physician if you do not have one) for your aftercare needs so that they can reassess your need for medications and monitor your lab values.    Do not drive, operate heavy machinery, perform activities at heights, swimming or participation in water activities or provide baby sitting services if your were admitted for loss of consciousness/seizures or if you are on sedating medications including, but not limited to benzodiazepines, sleep medications, narcotic pain medications, etc., until you have been cleared to do so by a medical doctor.   Do not take more than prescribed medications.   Wear a seat belt while driving.  If you have smoked or chewed Tobacco in the last 2 years please stop smoking; also stop any regular Alcohol and/or any Recreational drug use including marijuana.  If you experience worsening of your admission symptoms or develop shortness of breath, chest pain, suicidal or homicidal thoughts or experience a life threatening emergency, you must seek medical attention immediately by calling 911 or calling your PCP immediately.   Increase activity slowly   Complete by: As directed      Allergies as of 09/19/2019      Reactions   Azithromycin    Gabapentin       Medication List    STOP taking these medications   guaiFENesin 600 MG 12 hr tablet Commonly known as: MUCINEX   predniSONE 20 MG tablet Commonly known as: DELTASONE     TAKE these medications   acetaminophen 500 MG tablet Commonly known as: TYLENOL Take 1,000 mg by mouth every 6 (six) hours as needed for moderate pain or headache.   albuterol 108 (90 Base) MCG/ACT inhaler Commonly known as: VENTOLIN HFA Inhale into the lungs every 6 (six) hours as needed for wheezing or shortness of breath. What changed: Another medication with the same name was removed. Continue taking this medication, and follow the directions you see here.   ALPRAZolam 0.5 MG tablet Commonly known as: XANAX Take 1 tablet (0.5 mg total) by mouth at bedtime as needed for up to 3 days for anxiety.   amitriptyline 10 MG tablet Commonly known as: ELAVIL Take 10 mg by mouth  at bedtime.   amLODipine 2.5 MG tablet Commonly known as: NORVASC Take 2.5 mg by mouth daily as needed (high blood pressure).   budesonide-formoterol 160-4.5 MCG/ACT inhaler Commonly known as: SYMBICORT Inhale 1 puff into the lungs daily.   cholecalciferol 10 MCG (400 UNIT) Tabs tablet Commonly known as: VITAMIN D3 Take 800 Units by mouth.   docusate sodium 100 MG capsule Commonly known as: COLACE Take 2 capsules (200 mg total) by mouth 2 (two) times daily.   Ensure Take 237 mLs by mouth 4 (four) times daily. Between Meals.   fluconazole 200 MG tablet Commonly known as: DIFLUCAN Take 1 tablet (200 mg total) by mouth daily for 8 days.   lidocaine 2 % solution Commonly known as: XYLOCAINE Use as directed 15 mLs in the mouth or throat 3 (three) times daily before meals.   lipase/protease/amylase 12000 units Cpep capsule Commonly known as: CREON Take 24,000 Units by mouth 4 (four) times daily.   mirtazapine 15 MG tablet Commonly known as: REMERON Take 15 mg by mouth at bedtime.   multivitamin tablet  Take 1 tablet by mouth daily.   oxyCODONE 5 MG immediate release tablet Commonly known as: Oxy IR/ROXICODONE Take 1 tablet (5 mg total) by mouth 2 (two) times daily as needed for up to 3 days for severe pain or breakthrough pain.   pantoprazole 40 MG tablet Commonly known as: PROTONIX Take 1 tablet (40 mg total) by mouth 2 (two) times daily for 20 days, THEN 1 tablet (40 mg total) daily. Start taking on: September 19, 2019 What changed: See the new instructions.   polyethylene glycol 17 g packet Commonly known as: MIRALAX / GLYCOLAX Take 17 g by mouth daily as needed for moderate constipation.   pregabalin 150 MG capsule Commonly known as: LYRICA Take 150 mg by mouth 2 (two) times daily.   sucralfate 1 GM/10ML suspension Commonly known as: CARAFATE Take 10 mLs (1 g total) by mouth every 6 (six) hours.   tiotropium 18 MCG inhalation capsule Commonly known as:  SPIRIVA Place 18 mcg into inhaler and inhale daily.   tiZANidine 4 MG capsule Commonly known as: ZANAFLEX Take 2 mg by mouth at bedtime.            Durable Medical Equipment  (From admission, onward)         Start     Ordered   09/15/19 1346  For home use only DME 3 n 1  Once     09/15/19 1345         Follow-up Information    Home, [facility] Follow up.   Specialty: Home Health Services Why: [facility] physical therapy/occupational therapy Contact information: [place] [address] [phone]        Clinic, [facility] Follow up.   Why: bedside commode-will deliver to home Contact information: [place] [address] [phone]          Allergies  Allergen Reactions  . Azithromycin   . Gabapentin     Time coordinating discharge: Over 30 minutes   SIGNED:   Harold Hedge, D.O. [facility] Pager: [phone]  09/19/2019, 1:43 PM

## 2019-10-01 ENCOUNTER — Encounter (HOSPITAL_COMMUNITY): Payer: Self-pay

## 2019-10-01 ENCOUNTER — Emergency Department (HOSPITAL_COMMUNITY): Payer: No Typology Code available for payment source

## 2019-10-01 ENCOUNTER — Other Ambulatory Visit: Payer: Self-pay

## 2019-10-01 ENCOUNTER — Inpatient Hospital Stay (HOSPITAL_COMMUNITY)
Admission: EM | Admit: 2019-10-01 | Discharge: 2019-10-21 | DRG: 368 | Disposition: A | Discharge status: Home Health | Payer: No Typology Code available for payment source | Attending: Internal Medicine | Admitting: Internal Medicine

## 2019-10-01 DIAGNOSIS — K209 Esophagitis, unspecified without bleeding: Secondary | ICD-10-CM

## 2019-10-01 DIAGNOSIS — R0600 Dyspnea, unspecified: Secondary | ICD-10-CM

## 2019-10-01 DIAGNOSIS — K449 Diaphragmatic hernia without obstruction or gangrene: Secondary | ICD-10-CM | POA: Diagnosis present

## 2019-10-01 DIAGNOSIS — E876 Hypokalemia: Secondary | ICD-10-CM | POA: Diagnosis not present

## 2019-10-01 DIAGNOSIS — K3184 Gastroparesis: Secondary | ICD-10-CM | POA: Diagnosis not present

## 2019-10-01 DIAGNOSIS — Z902 Acquired absence of lung [part of]: Secondary | ICD-10-CM

## 2019-10-01 DIAGNOSIS — Z7951 Long term (current) use of inhaled steroids: Secondary | ICD-10-CM

## 2019-10-01 DIAGNOSIS — I951 Orthostatic hypotension: Secondary | ICD-10-CM | POA: Diagnosis present

## 2019-10-01 DIAGNOSIS — Z66 Do not resuscitate: Secondary | ICD-10-CM | POA: Diagnosis present

## 2019-10-01 DIAGNOSIS — K219 Gastro-esophageal reflux disease without esophagitis: Secondary | ICD-10-CM | POA: Diagnosis present

## 2019-10-01 DIAGNOSIS — K228 Other specified diseases of esophagus: Secondary | ICD-10-CM | POA: Diagnosis present

## 2019-10-01 DIAGNOSIS — F329 Major depressive disorder, single episode, unspecified: Secondary | ICD-10-CM | POA: Diagnosis present

## 2019-10-01 DIAGNOSIS — R64 Cachexia: Secondary | ICD-10-CM | POA: Diagnosis present

## 2019-10-01 DIAGNOSIS — I10 Essential (primary) hypertension: Secondary | ICD-10-CM | POA: Diagnosis present

## 2019-10-01 DIAGNOSIS — E860 Dehydration: Secondary | ICD-10-CM

## 2019-10-01 DIAGNOSIS — E46 Unspecified protein-calorie malnutrition: Secondary | ICD-10-CM

## 2019-10-01 DIAGNOSIS — J9602 Acute respiratory failure with hypercapnia: Secondary | ICD-10-CM | POA: Diagnosis not present

## 2019-10-01 DIAGNOSIS — Z681 Body mass index (BMI) 19 or less, adult: Secondary | ICD-10-CM

## 2019-10-01 DIAGNOSIS — F419 Anxiety disorder, unspecified: Secondary | ICD-10-CM | POA: Diagnosis present

## 2019-10-01 DIAGNOSIS — Z85118 Personal history of other malignant neoplasm of bronchus and lung: Secondary | ICD-10-CM

## 2019-10-01 DIAGNOSIS — Z8249 Family history of ischemic heart disease and other diseases of the circulatory system: Secondary | ICD-10-CM

## 2019-10-01 DIAGNOSIS — B3781 Candidal esophagitis: Secondary | ICD-10-CM | POA: Diagnosis not present

## 2019-10-01 DIAGNOSIS — M1990 Unspecified osteoarthritis, unspecified site: Secondary | ICD-10-CM | POA: Diagnosis present

## 2019-10-01 DIAGNOSIS — M810 Age-related osteoporosis without current pathological fracture: Secondary | ICD-10-CM | POA: Diagnosis present

## 2019-10-01 DIAGNOSIS — Z833 Family history of diabetes mellitus: Secondary | ICD-10-CM

## 2019-10-01 DIAGNOSIS — Z09 Encounter for follow-up examination after completed treatment for conditions other than malignant neoplasm: Secondary | ICD-10-CM

## 2019-10-01 DIAGNOSIS — K2210 Ulcer of esophagus without bleeding: Secondary | ICD-10-CM | POA: Diagnosis present

## 2019-10-01 DIAGNOSIS — Z87891 Personal history of nicotine dependence: Secondary | ICD-10-CM

## 2019-10-01 DIAGNOSIS — Z9981 Dependence on supplemental oxygen: Secondary | ICD-10-CM

## 2019-10-01 DIAGNOSIS — K8689 Other specified diseases of pancreas: Secondary | ICD-10-CM | POA: Diagnosis present

## 2019-10-01 DIAGNOSIS — K222 Esophageal obstruction: Secondary | ICD-10-CM

## 2019-10-01 DIAGNOSIS — Z20822 Contact with and (suspected) exposure to covid-19: Secondary | ICD-10-CM | POA: Diagnosis present

## 2019-10-01 DIAGNOSIS — Z888 Allergy status to other drugs, medicaments and biological substances status: Secondary | ICD-10-CM

## 2019-10-01 DIAGNOSIS — Z808 Family history of malignant neoplasm of other organs or systems: Secondary | ICD-10-CM

## 2019-10-01 DIAGNOSIS — K269 Duodenal ulcer, unspecified as acute or chronic, without hemorrhage or perforation: Secondary | ICD-10-CM | POA: Diagnosis present

## 2019-10-01 DIAGNOSIS — T402X5A Adverse effect of other opioids, initial encounter: Secondary | ICD-10-CM | POA: Diagnosis not present

## 2019-10-01 DIAGNOSIS — Z90411 Acquired partial absence of pancreas: Secondary | ICD-10-CM

## 2019-10-01 DIAGNOSIS — E43 Unspecified severe protein-calorie malnutrition: Secondary | ICD-10-CM | POA: Insufficient documentation

## 2019-10-01 DIAGNOSIS — Z881 Allergy status to other antibiotic agents status: Secondary | ICD-10-CM

## 2019-10-01 DIAGNOSIS — J9622 Acute and chronic respiratory failure with hypercapnia: Secondary | ICD-10-CM | POA: Diagnosis present

## 2019-10-01 DIAGNOSIS — G92 Toxic encephalopathy: Secondary | ICD-10-CM | POA: Diagnosis not present

## 2019-10-01 DIAGNOSIS — J449 Chronic obstructive pulmonary disease, unspecified: Secondary | ICD-10-CM | POA: Diagnosis present

## 2019-10-01 LAB — URINALYSIS, ROUTINE W REFLEX MICROSCOPIC
Glucose, UA: NEGATIVE mg/dL
Hgb urine dipstick: NEGATIVE
Ketones, ur: 80 mg/dL — AB
Nitrite: NEGATIVE
Protein, ur: 30 mg/dL — AB
Specific Gravity, Urine: 1.032 — ABNORMAL HIGH (ref 1.005–1.030)
pH: 5 (ref 5.0–8.0)

## 2019-10-01 LAB — COMPREHENSIVE METABOLIC PANEL
ALT: 26 U/L (ref 0–44)
AST: 26 U/L (ref 15–41)
Albumin: 3.8 g/dL (ref 3.5–5.0)
Alkaline Phosphatase: 85 U/L (ref 38–126)
Anion gap: 11 (ref 5–15)
BUN: 19 mg/dL (ref 8–23)
CO2: 30 mmol/L (ref 22–32)
Calcium: 9.2 mg/dL (ref 8.9–10.3)
Chloride: 103 mmol/L (ref 98–111)
Creatinine, Ser: 0.53 mg/dL (ref 0.44–1.00)
GFR calc Af Amer: >60 mL/min (ref >60)
GFR calc non Af Amer: >60 mL/min (ref >60)
Glucose, Bld: 98 mg/dL (ref 70–99)
Potassium: 3.7 mmol/L (ref 3.5–5.1)
Sodium: 144 mmol/L (ref 135–145)
Total Bilirubin: 0.7 mg/dL (ref 0.3–1.2)
Total Protein: 6.8 g/dL (ref 6.5–8.1)

## 2019-10-01 LAB — CBC WITH DIFFERENTIAL/PLATELET
Abs Immature Granulocytes: 0.03 10*3/uL (ref 0.00–0.07)
Basophils Absolute: 0.1 10*3/uL (ref 0.0–0.1)
Basophils Relative: 1 %
Eosinophils Absolute: 0.1 10*3/uL (ref 0.0–0.5)
Eosinophils Relative: 1 %
HCT: 42 % (ref 36.0–46.0)
Hemoglobin: 13.1 g/dL (ref 12.0–15.0)
Immature Granulocytes: 0 %
Lymphocytes Relative: 42 %
Lymphs Abs: 4 10*3/uL (ref 0.7–4.0)
MCH: 29 pg (ref 26.0–34.0)
MCHC: 31.2 g/dL (ref 30.0–36.0)
MCV: 92.9 fL (ref 80.0–100.0)
Monocytes Absolute: 0.9 10*3/uL (ref 0.1–1.0)
Monocytes Relative: 10 %
Neutro Abs: 4.4 10*3/uL (ref 1.7–7.7)
Neutrophils Relative %: 46 %
Platelets: 528 10*3/uL — ABNORMAL HIGH (ref 150–400)
RBC: 4.52 MIL/uL (ref 3.87–5.11)
RDW: 15.9 % — ABNORMAL HIGH (ref 11.5–15.5)
WBC: 9.5 10*3/uL (ref 4.0–10.5)
nRBC: 0 % (ref 0.0–0.2)

## 2019-10-01 LAB — LIPASE, BLOOD: Lipase: 21 U/L (ref 11–51)

## 2019-10-01 LAB — MAGNESIUM: Magnesium: 1.7 mg/dL (ref 1.7–2.4)

## 2019-10-01 MED ORDER — UMECLIDINIUM BROMIDE 62.5 MCG/INH IN AEPB
1.0000 | INHALATION_SPRAY | Freq: Every day | RESPIRATORY_TRACT | Status: DC
  Administered 2019-10-02 – 2019-10-17 (×15): 1 via RESPIRATORY_TRACT
  Filled 2019-10-01 (×2): qty 7

## 2019-10-01 MED ORDER — PANTOPRAZOLE SODIUM 40 MG PO TBEC
40.0000 mg | DELAYED_RELEASE_TABLET | Freq: Two times a day (BID) | ORAL | Status: DC
  Administered 2019-10-02: 40 mg via ORAL
  Filled 2019-10-01 (×2): qty 1

## 2019-10-01 MED ORDER — SODIUM CHLORIDE 0.9% FLUSH
3.0000 mL | Freq: Two times a day (BID) | INTRAVENOUS | Status: DC
  Administered 2019-10-02 – 2019-10-09 (×9): 3 mL via INTRAVENOUS

## 2019-10-01 MED ORDER — FLUCONAZOLE IN SODIUM CHLORIDE 400-0.9 MG/200ML-% IV SOLN
400.0000 mg | Freq: Every day | INTRAVENOUS | Status: DC
  Administered 2019-10-01 – 2019-10-02 (×2): 400 mg via INTRAVENOUS
  Filled 2019-10-01 (×2): qty 200

## 2019-10-01 MED ORDER — ONDANSETRON HCL 4 MG/2ML IJ SOLN: 4.0000 mg | Freq: Four times a day (QID) | INTRAMUSCULAR | Status: DC | PRN

## 2019-10-01 MED ORDER — ALBUTEROL SULFATE (2.5 MG/3ML) 0.083% IN NEBU
2.5000 mg | INHALATION_SOLUTION | RESPIRATORY_TRACT | Status: DC | PRN
  Administered 2019-10-02 – 2019-10-11 (×2): 2.5 mg via RESPIRATORY_TRACT
  Filled 2019-10-01 (×2): qty 3

## 2019-10-01 MED ORDER — SUCRALFATE 1 GM/10ML PO SUSP
1.0000 g | Freq: Four times a day (QID) | ORAL | Status: DC
  Administered 2019-10-02 (×5): 1 g via ORAL
  Filled 2019-10-01 (×5): qty 10

## 2019-10-01 MED ORDER — POTASSIUM CHLORIDE 2 MEQ/ML IV SOLN
INTRAVENOUS | Status: AC
  Filled 2019-10-01 (×2): qty 1000

## 2019-10-01 MED ORDER — ONDANSETRON HCL 4 MG PO TABS: 4.0000 mg | ORAL_TABLET | Freq: Four times a day (QID) | ORAL | Status: DC | PRN

## 2019-10-01 MED ORDER — ENOXAPARIN SODIUM 30 MG/0.3ML ~~LOC~~ SOLN
30.0000 mg | Freq: Every day | SUBCUTANEOUS | Status: DC
  Administered 2019-10-02 – 2019-10-17 (×16): 30 mg via SUBCUTANEOUS
  Filled 2019-10-01 (×17): qty 0.3

## 2019-10-01 MED ORDER — ENSURE ENLIVE PO LIQD
237.0000 mL | Freq: Four times a day (QID) | ORAL | Status: DC
  Administered 2019-10-02 – 2019-10-10 (×15): 237 mL via ORAL

## 2019-10-01 MED ORDER — LACTATED RINGERS IV BOLUS
500.0000 mL | Freq: Once | INTRAVENOUS | Status: AC
  Administered 2019-10-01: 500 mL via INTRAVENOUS

## 2019-10-01 MED ORDER — MIRTAZAPINE 15 MG PO TABS
15.0000 mg | ORAL_TABLET | Freq: Every day | ORAL | Status: DC
  Administered 2019-10-02: 15 mg via ORAL
  Filled 2019-10-01: qty 1

## 2019-10-01 MED ORDER — ACETAMINOPHEN 325 MG PO TABS: 650.0000 mg | ORAL_TABLET | Freq: Four times a day (QID) | ORAL | Status: DC | PRN

## 2019-10-01 MED ORDER — FENTANYL CITRATE (PF) 100 MCG/2ML IJ SOLN
50.0000 ug | Freq: Once | INTRAMUSCULAR | Status: AC
  Administered 2019-10-01: 50 ug via INTRAVENOUS
  Filled 2019-10-01: qty 2

## 2019-10-01 MED ORDER — PANCRELIPASE (LIP-PROT-AMYL) 12000-38000 UNITS PO CPEP
24000.0000 [IU] | ORAL_CAPSULE | Freq: Four times a day (QID) | ORAL | Status: DC
  Administered 2019-10-02: 24000 [IU] via ORAL
  Filled 2019-10-01 (×2): qty 2

## 2019-10-01 MED ORDER — ACETAMINOPHEN 650 MG RE SUPP
650.0000 mg | Freq: Four times a day (QID) | RECTAL | Status: DC | PRN
  Administered 2019-10-02: 16:00:00 650 mg via RECTAL
  Filled 2019-10-01: qty 1

## 2019-10-01 NOTE — ED Triage Notes (Signed)
Pt BIB EMS from home, pt lives with family. Pt c/o SOB, esophageal pain. Pt has hx of COPD, lung cancer, stomach cancer, partial pneumonectomy. Pt has home O2. Per EMS, pt 98% on 3L [place].  100-110 HR 24 Resp  CBG 109

## 2019-10-01 NOTE — ED Notes (Signed)
Pt sitting up in bed awake. Full monitor on. Pt ready for transport to floor

## 2019-10-01 NOTE — Progress Notes (Signed)
Rx Brief note: Lovenox  Wt = 34 kg, CrCl~40.1 ml/m  Adjusted Lovenox to 30 mg daily in pt with wt <45 kg  Thanks Dorrene German 10/01/2019 11:39 PM

## 2019-10-01 NOTE — ED Notes (Signed)
Pt placed on bedpan to attempt urine sample collection. Will check back momentarily.

## 2019-10-01 NOTE — H&P (Signed)
History and Physical    Claudia Wiley [id number] DOB: 26-Feb-1956 DOA: 10/01/2019  PCP: [facility]  Patient coming from: Home  I have personally briefly reviewed patient's old medical records in [place]  Chief Complaint: Dehydration/lightheadedness  HPI: Claudia Wiley is a 64 y.o. female with medical history significant for COPD/chronic respiratory failure with hypoxia on 2 L supplemental O2 via [place], history of lung cancer s/p LUL lobectomy, gastrinoma s/p Whipple's procedure, anxiety/depression, esophageal stricture and dilatation July 2019, hypertension, and recent admission for severe Candida esophagitis with esophageal necrosis who presents to the ED for evaluation of dehydration/lightheadedness.  Patient recently admitted from 09/09/2019-09/19/2019 initially for COPD exacerbation.  She was also having odynophagia and underwent EGD on 09/11/2019 which revealed changes suggestive of esophageal necrosis and duodenal ulcers.  Endoscopy culture grew unknown fungus and bacteria with special stains indicating Candida infection.  Patient completed empiric 7 days antibiotic treatment with Flagyl and 6 days of cefepime.  She was started on fluconazole 200 mg daily for 14-day course (6 of which was completed in the hospital).  Hospital stay was complicated by volume overload after initial IV fluid resuscitation.  This resolved with diuresis.  Echocardiogram on 09/16/2019 showed preserved ejection fraction without evidence of diastolic impairment.  Patient states she was feeling well for about 2 days after discharge from the hospital.  Afterwards she began experiencing pain with swallowing and feeling as if food getting stuck in her lower chest.  She states she was able to complete her full course of fluconazole but was having continued symptoms.  She saw her primary care physician at the [facility] last week who felt her esophagitis had not completely resolved.  She says they were trying to restart  antifungal therapy.  She says over the last couple days she is not been able to keep down anything by mouth including her medications which she would shortly throw up.  She has not seen any blood in her emesis.  She has been lightheaded and dizzy but denies any loss of consciousness.  She says she has fallen twice in the last week without significant injury.  She denies any fevers or chills but reports having diaphoresis recently.  She reports having abdominal pain from her umbilicus up to her chest.  She denies any diarrhea.  She reports some dysuria.  ED Course:  Initial vitals in the ED showed BP 117/77, pulse 104, RR 16, temp 97.6 Fahrenheit, SPO2 100% on 4 L supplemental O2 via [place].  Labs show sodium 144, potassium 3.7, bicarb 30, BUN 19, creatinine 0.53, serum glucose 98, LFTs within normal limits, WBC 9.5, hemoglobin 13.1, platelets 528,000, lipase 21, magnesium 1.7.  Urinalysis showed negative nitrites, trace leukocytes, 0-5 RBC/hpf, 11-20 WBC/hpf, rare bacteria microscopy.  Portable chest x-ray showed hyperinflated lung fields without evidence of acute abnormality.  Orthostatic vital signs showed decrease in diastolic pressure from [id number] from lying to sitting position and increase in heart rate from 105>131 from lying to standing positions.  Patient was given 500 cc LR and the hospitalist service was consulted to admit for further evaluation and management.  Review of Systems: All systems reviewed and are negative except as documented in history of present illness above.   Past Medical History:  Diagnosis Date  . Anemia   . Arthritis   . Cancer [facility] 2013   Upper left lung and stomach cancer  . COPD (chronic obstructive pulmonary disease) ([place])   . Esophageal stricture   . Family history of  brain cancer   . Family history of melanoma   . Gastrinoma   . Gastrinoma   . GERD (gastroesophageal reflux disease)   . Hypertension   . Osteoporosis   . Pancreatic insufficiency   .  Pneumonia   . Vitamin D deficiency   . Weight loss, unintentional     Past Surgical History:  Procedure Laterality Date  . ABDOMINAL HYSTERECTOMY    . BIOPSY  09/11/2019   Procedure: BIOPSY;  Surgeon: Lavena Bullion, DO;  Location: [facility];  Service: Gastroenterology;;  . BREAST LUMPECTOMY    . CESAREAN SECTION    . ESOPHAGOGASTRODUODENOSCOPY (EGD) WITH PROPOFOL N/A 09/11/2019   Procedure: ESOPHAGOGASTRODUODENOSCOPY (EGD) WITH PROPOFOL;  Surgeon: Lavena Bullion, DO;  Location: [facility];  Service: Gastroenterology;  Laterality: N/A;  . LUNG SURGERY      Social History:  reports that she quit smoking about 4 months ago. Her smoking use included cigarettes. She has a 0.50 pack-year smoking history. She has never used smokeless tobacco. She reports that she does not drink alcohol or use drugs.  Allergies  Allergen Reactions  . Azithromycin Other (See Comments)    Unknown   . Boniva [Ibandronic Acid] Other (See Comments)    unknown  . Gabapentin Other (See Comments)    unknown    Family History  Problem Relation Age of Onset  . Diabetes Sister        x 3  . Cancer Sister 85       brain that metastized to bone, stomach and other organs  . Melanoma Maternal Aunt   . Heart attack Father   . Pneumonia Brother   . Cancer Other 17       cancer in his chest - nephew  . Colon cancer Neg Hx   . Pancreatic cancer Neg Hx   . Rectal cancer Neg Hx   . Esophageal cancer Neg Hx      Prior to Admission medications   Medication Sig Start Date End Date Taking? Authorizing Provider  acetaminophen (TYLENOL) 500 MG tablet Take 1,000 mg by mouth every 6 (six) hours as needed for moderate pain or headache.   Yes [provider]  albuterol (PROVENTIL HFA;VENTOLIN HFA) 108 (90 Base) MCG/ACT inhaler Inhale 2 puffs into the lungs every 6 (six) hours as needed for wheezing or shortness of breath.    Yes [provider]  albuterol (PROVENTIL) (2.5 MG/3ML) 0.083%  nebulizer solution Take 2.5 mg by nebulization every 6 (six) hours as needed for wheezing or shortness of breath.   Yes [provider]  budesonide-formoterol (SYMBICORT) 160-4.5 MCG/ACT inhaler Inhale 1 puff into the lungs daily. 06/18/18  Yes Regalado, Belkys A, MD  calcium-vitamin D (OSCAL WITH D) 500-200 MG-UNIT tablet Take 1 tablet by mouth 2 (two) times daily.   Yes [provider]  cholecalciferol (VITAMIN D3) 25 MCG (1000 UNIT) tablet Take 1,000 Units by mouth daily.   Yes [provider]  docusate sodium (COLACE) 100 MG capsule Take 2 capsules (200 mg total) by mouth 2 (two) times daily. 09/19/19  Yes Harold Hedge, MD  ENSURE (ENSURE) Take 237 mLs by mouth 4 (four) times daily. Between Meals.   Yes [provider]  fluticasone (FLONASE) 50 MCG/ACT nasal spray Place 1 spray into both nostrils daily as needed for allergies or rhinitis.   Yes [provider]  lidocaine (XYLOCAINE) 2 % solution Use as directed 15 mLs in the mouth or throat 3 (three) times  daily before meals. 09/19/19 10/20/19 Yes Harold Hedge, MD  lipase/protease/amylase (CREON) 12000 units CPEP capsule Take 24,000 Units by mouth 4 (four) times daily.    Yes [provider]  mirtazapine (REMERON) 15 MG tablet Take 15 mg by mouth at bedtime.   Yes [provider]  Multiple Vitamin (MULTIVITAMIN) tablet Take 1 tablet by mouth daily.   Yes [provider]  pantoprazole (PROTONIX) 40 MG tablet Take 40 mg by mouth 2 (two) times daily.   Yes [provider]  sucralfate (CARAFATE) 1 GM/10ML suspension Take 10 mLs (1 g total) by mouth every 6 (six) hours. 09/19/19 10/20/19 Yes Harold Hedge, MD  tiotropium (SPIRIVA) 18 MCG inhalation capsule Place 18 mcg into inhaler and inhale daily.   Yes [provider]  tiZANidine (ZANAFLEX) 4 MG capsule Take 2 mg by mouth at bedtime.    Yes [provider]  pantoprazole (PROTONIX) 40 MG tablet Take 1 tablet  (40 mg total) by mouth 2 (two) times daily for 20 days, THEN 1 tablet (40 mg total) daily. Patient not taking: Reported on 10/01/2019 09/19/19 01/07/20  Harold Hedge, MD  polyethylene glycol (MIRALAX / GLYCOLAX) 17 g packet Take 17 g by mouth daily as needed for moderate constipation. Patient not taking: Reported on 10/01/2019 04/06/19   Oretha Milch D, MD    Physical Exam: Vitals:   10/01/19 1930 10/01/19 2000 10/01/19 2030 10/01/19 2100  BP: 114/77 (!) 139/95 140/85 122/90  Pulse: 100 (!) 107 (!) 113 (!) 101  Resp: 15 19 (!) 23 20  Temp:      TempSrc:      SpO2: 100% 100% 100% 100%  Weight:      Height:       Constitutional: Cachectic appearing woman resting in bed with head elevated, NAD, appears tired and tearful Eyes: PERRL, lids and conjunctivae normal ENMT: Mucous membranes are dry. Posterior pharynx clear of any obvious exudate or lesions.  Neck: normal, supple, no masses. Respiratory: clear to auscultation bilaterally, no active wheezing, no crackles. Normal respiratory effort. No accessory muscle use.  Saturating 100% on 2 L supplemental O2 via [place]. Cardiovascular: Regular rate and rhythm, no murmurs / rubs / gallops. No extremity edema. 2+ pedal pulses. Abdomen: Mild epigastric tenderness, no masses palpated. No hepatosplenomegaly. Bowel sounds positive.  Musculoskeletal: no clubbing / cyanosis. No joint deformity upper and lower extremities. Good ROM, no contractures. Normal muscle tone.  Skin: no rashes, lesions, ulcers. No induration Neurologic: CN 2-12 grossly intact. Sensation intact, Strength 5/5 in all 4.  Psychiatric: Normal judgment and insight. Alert and oriented x 3. Normal mood.   Labs on Admission: I have personally reviewed following labs and imaging studies  CBC: Recent Labs  Lab 10/01/19 1644  WBC 9.5  NEUTROABS 4.4  HGB 13.1  HCT 42.0  MCV 92.9  PLT 0000000*   Basic Metabolic Panel: Recent Labs  Lab 10/01/19 1644  NA 144  K 3.7  CL 103  CO2 30    GLUCOSE 98  BUN 19  CREATININE 0.53  CALCIUM 9.2  MG 1.7   GFR: Estimated Creatinine Clearance: 40.1 mL/min (by C-G formula based on SCr of 0.53 mg/dL). Liver Function Tests: Recent Labs  Lab 10/01/19 1644  AST 26  ALT 26  ALKPHOS 85  BILITOT 0.7  PROT 6.8  ALBUMIN 3.8   Recent Labs  Lab 10/01/19 1644  LIPASE 21   No results for input(s): AMMONIA in the last 168 hours. Coagulation  Profile: No results for input(s): INR, PROTIME in the last 168 hours. Cardiac Enzymes: No results for input(s): CKTOTAL, CKMB, CKMBINDEX, TROPONINI in the last 168 hours. BNP (last 3 results) No results for input(s): PROBNP in the last 8760 hours. HbA1C: No results for input(s): HGBA1C in the last 72 hours. CBG: No results for input(s): GLUCAP in the last 168 hours. Lipid Profile: No results for input(s): CHOL, HDL, LDLCALC, TRIG, CHOLHDL, LDLDIRECT in the last 72 hours. Thyroid Function Tests: No results for input(s): TSH, T4TOTAL, FREET4, T3FREE, THYROIDAB in the last 72 hours. Anemia Panel: No results for input(s): VITAMINB12, FOLATE, FERRITIN, TIBC, IRON, RETICCTPCT in the last 72 hours. Urine analysis:    Component Value Date/Time   COLORURINE AMBER (A) 10/01/2019 2024   APPEARANCEUR CLEAR 10/01/2019 2024   LABSPEC 1.032 (H) 10/01/2019 2024   PHURINE 5.0 10/01/2019 2024   GLUCOSEU NEGATIVE 10/01/2019 2024   HGBUR NEGATIVE 10/01/2019 2024   BILIRUBINUR SMALL (A) 10/01/2019 2024   KETONESUR 80 (A) 10/01/2019 2024   PROTEINUR 30 (A) 10/01/2019 2024   UROBILINOGEN 0.2 12/27/2012 0454   NITRITE NEGATIVE 10/01/2019 2024   LEUKOCYTESUR TRACE (A) 10/01/2019 2024    Radiological Exams on Admission: DG Chest Portable 1 View  Result Date: 10/01/2019 CLINICAL DATA:  Shortness of breath. Esophageal pain. History of COPD, lung cancer, stomach cancer, partial pneumonectomy. EXAM: PORTABLE CHEST 1 VIEW COMPARISON:  09/10/2019 FINDINGS: Lungs are hyperinflated. No focal consolidations or  pleural effusions. No pulmonary edema. Surgical clips overlie the LEFT hilar region. IMPRESSION: No evidence for acute abnormality.  Hyperinflation. Electronically Signed   By: Nolon Nations M.D.   On: 10/01/2019 16:59    EKG: Independently reviewed. Sinus tachycardia, RAE, motion artifact.  Not significantly changed compared to prior.  Assessment/Plan Principal Problem:   Orthostasis Active Problems:   Essential hypertension   Pancreatic insufficiency   COPD (chronic obstructive pulmonary disease) (HCC)   Candida esophagitis (HCC)  Claudia Wiley is a 64 y.o. female with medical history significant for COPD/chronic respiratory failure with hypoxia on 2 L supplemental O2 via [place], history of lung cancer s/p LUL lobectomy, gastrinoma s/p Whipple's procedure, anxiety/depression, esophageal stricture and dilatation July 2019, hypertension, and recent admission for severe Candida esophagitis with esophageal necrosis who is admitted with dehydration/orthostasis due to persistent odynophagia and inability to maintain adequate oral intake.  Candida esophagitis/odynophagia/duodenal ulcers: Patient with recent admission for severe Candida esophagitis with esophageal necrosis.  She completed 14 days of oral fluconazole 200 mg daily with persistent pain/difficulty with swallowing and feeling as if food is getting stuck in her lower chest.  Surgical pathology from EGD on 09/11/2019 was suggestive of Candida, not specified whether albicans or other nonalbicans species.  Given persistent symptoms will start on IV fluconazole. -Start IV fluconazole 400 mg daily -Keep n.p.o. except for sips with meds as tolerated -Continue Protonix, sucralfate -She will likely need repeat GI evaluation  Orthostasis/dehydration: Secondary to inability to maintain adequate oral intake as above.  Continue gentle IV fluid hydration with LR + K @ 75 mL/hr x 12 hours.  Monitor volume status closely as she reportedly became volume  overloaded on last admission.  COPD with chronic respiratory failure with hypoxia: Chronic and stable without evidence of acute exacerbation.  Currently saturating well on home 2 L supplemental O2 via [place]. -Continue albuterol as needed, Spiriva  Pancreatic insufficiency: Continue Creon as tolerated.  Anxiety/depression: Continue Remeron.   DVT prophylaxis: Lovenox Code Status: DNR, confirmed with patient Family Communication: Discussed with  patient, she has discussed with family Disposition Plan: From home, likely discharge to home pending adequate hydration and ability to maintain oral intake, likely will need in hospital GI evaluation. Consults called: None Admission status: Observation   Zada Finders MD [facility]  If 7PM-7AM, please contact night-coverage [email or web address]  10/01/2019, 9:28 PM

## 2019-10-01 NOTE — ED Provider Notes (Signed)
[facility] DEPT Provider Note   CSN: [id number] Arrival date & time: 10/01/19  1623     History Chief Complaint  Patient presents with  . Shortness of Breath    Claudia Wiley is a 64 y.o. female.  HPI Patient presents with generalized weakness chest pain abdominal pain.  Had been admitted to hospital up until around 12 days ago.  Had fair esophagitis.  Both fungal and may have been some bacterial.  Had been on antibiotics.  States she left feeling better but not good.  States since leaving been doing worse.  Really cannot eat or drink anything.  States she has an appetite but hurts too much to eat.  Feels more short of breath.  He is on chronic oxygen.  Feels weak all over.  Not having fevers.  Only having a rare cough.  Pain hurts some chest and abdomen.    Past Medical History:  Diagnosis Date  . Anemia   . Arthritis   . Cancer [facility]) 2013   Upper left lung and stomach cancer  . COPD (chronic obstructive pulmonary disease) ([place])   . Esophageal stricture   . Family history of brain cancer   . Family history of melanoma   . Gastrinoma   . Gastrinoma   . GERD (gastroesophageal reflux disease)   . Hypertension   . Osteoporosis   . Pancreatic insufficiency   . Pneumonia   . Vitamin D deficiency   . Weight loss, unintentional     Patient Active Problem List   Diagnosis Date Noted  . Odynophagia   . Leukocytosis   . Acute esophagitis   . Esophageal dysphagia   . Esophageal stenosis   . Respiratory failure ([place]) 09/09/2019  . COPD (chronic obstructive pulmonary disease) ([place]) 06/30/2019  . Acute on chronic respiratory failure with hypoxia and hypercapnia ([place]) 06/29/2019  . GERD (gastroesophageal reflux disease)   . Pancreatic insufficiency   . Nausea & vomiting 04/03/2019  . Abdominal pain 04/03/2019  . Abnormal finding on urinalysis 04/03/2019  . Acute respiratory failure with hypoxia and hypercapnia ([place]) 04/02/2019  . COPD with acute  exacerbation ([place]) 06/15/2018  . Essential hypertension 06/15/2018  . Genetic testing 06/01/2018  . Gastrinoma   . Family history of melanoma   . Family history of brain cancer   . Hypokalemia 10/19/2011  . Severe protein-calorie malnutrition ([place]) 10/19/2011  . Weight loss 10/19/2011  . Generalized weakness 10/19/2011  . Dysphagia 10/19/2011  . Anodontia 10/19/2011    Past Surgical History:  Procedure Laterality Date  . ABDOMINAL HYSTERECTOMY    . BIOPSY  09/11/2019   Procedure: BIOPSY;  Surgeon: Lavena Bullion, DO;  Location: [facility];  Service: Gastroenterology;;  . BREAST LUMPECTOMY    . CESAREAN SECTION    . ESOPHAGOGASTRODUODENOSCOPY (EGD) WITH PROPOFOL N/A 09/11/2019   Procedure: ESOPHAGOGASTRODUODENOSCOPY (EGD) WITH PROPOFOL;  Surgeon: Lavena Bullion, DO;  [facility];  Service: Gastroenterology;  Laterality: N/A;  . LUNG SURGERY       OB History   No obstetric history on file.     Family History  Problem Relation Age of Onset  . Diabetes Sister        x 3  . Cancer Sister 44       brain that metastized to bone, stomach and other organs  . Melanoma Maternal Aunt   . Heart attack Father   . Pneumonia Brother   . Cancer Other 17  cancer in his chest - nephew  . Colon cancer Neg Hx   . Pancreatic cancer Neg Hx   . Rectal cancer Neg Hx   . Esophageal cancer Neg Hx     Social History   Tobacco Use  . Smoking status: Former Smoker    Packs/day: 0.25    Years: 2.00    Pack years: 0.50    Types: Cigarettes    Quit date: 05/30/2019    Years since quitting: 0.3  . Smokeless tobacco: Never Used  . Tobacco comment: Not ready  Substance Use Topics  . Alcohol use: No  . Drug use: No    Home Medications Prior to Admission medications   Medication Sig Start Date End Date Taking? Authorizing Provider  acetaminophen (TYLENOL) 500 MG tablet Take 1,000 mg by mouth every 6 (six) hours as needed for moderate pain or headache.     [provider]  albuterol (PROVENTIL HFA;VENTOLIN HFA) 108 (90 Base) MCG/ACT inhaler Inhale into the lungs every 6 (six) hours as needed for wheezing or shortness of breath.    [provider]  amitriptyline (ELAVIL) 10 MG tablet Take 10 mg by mouth at bedtime.    [provider]  amLODipine (NORVASC) 2.5 MG tablet Take 2.5 mg by mouth daily as needed (high blood pressure).     [provider]  budesonide-formoterol (SYMBICORT) 160-4.5 MCG/ACT inhaler Inhale 1 puff into the lungs daily. 06/18/18   Regalado, Belkys A, MD  docusate sodium (COLACE) 100 MG capsule Take 2 capsules (200 mg total) by mouth 2 (two) times daily. 09/19/19   Harold Hedge, MD  ENSURE (ENSURE) Take 237 mLs by mouth 4 (four) times daily. Between Meals.    [provider]  lidocaine (XYLOCAINE) 2 % solution Use as directed 15 mLs in the mouth or throat 3 (three) times daily before meals. 09/19/19 10/20/19  Harold Hedge, MD  lipase/protease/amylase (CREON) 12000 units CPEP capsule Take 24,000 Units by mouth 4 (four) times daily.     [provider]  mirtazapine (REMERON) 15 MG tablet Take 15 mg by mouth at bedtime.    [provider]  Multiple Vitamin (MULTIVITAMIN) tablet Take 1 tablet by mouth daily.    [provider]  pantoprazole (PROTONIX) 40 MG tablet Take 1 tablet (40 mg total) by mouth 2 (two) times daily for 20 days, THEN 1 tablet (40 mg total) daily. 09/19/19 01/07/20  Harold Hedge, MD  polyethylene glycol (MIRALAX / GLYCOLAX) 17 g packet Take 17 g by mouth daily as needed for moderate constipation. 04/06/19   Desiree Hane, MD  pregabalin (LYRICA) 150 MG capsule Take 150 mg by mouth 2 (two) times daily.    [provider]  sucralfate (CARAFATE) 1 GM/10ML suspension Take 10 mLs (1 g total) by mouth every 6 (six) hours. 09/19/19 10/20/19  Harold Hedge, MD  tiotropium (SPIRIVA) 18 MCG inhalation capsule Place 18 mcg into inhaler and inhale daily.     [provider]  tiZANidine (ZANAFLEX) 4 MG capsule Take 2 mg by mouth at bedtime.     [provider]    Allergies    Azithromycin, Boniva [ibandronic acid], and Gabapentin  Review of Systems   Review of Systems  Constitutional: Positive for fatigue. Negative for appetite change and fever.  HENT: Negative for congestion.   Respiratory: Positive for shortness of breath.   Cardiovascular: Positive for chest pain.  Gastrointestinal: Positive for abdominal pain, nausea and vomiting.  Genitourinary: Negative for flank pain.  Musculoskeletal: Negative for back pain.  Skin: Negative for rash.  Neurological: Negative for weakness.  Psychiatric/Behavioral: Negative for confusion.    Physical Exam Updated Vital Signs BP (!) 139/95   Pulse (!) 107   Temp 97.6 F (36.4 C) (Oral)   Resp 19   Ht 4' 11.02" (1.499 m)   Wt 34.4 kg   SpO2 100%   BMI 15.31 kg/m   Physical Exam Vitals and nursing note reviewed.  Constitutional:      Comments: Cachectic  HENT:     Head: Normocephalic.  Cardiovascular:     Rate and Rhythm: Tachycardia present.  Pulmonary:     Effort: Pulmonary effort is normal.     Breath sounds: Normal breath sounds.  Chest:     Chest wall: Tenderness present.  Abdominal:     Comments: Epigastric tenderness without rebound or guarding  Musculoskeletal:     Right lower leg: No edema.     Left lower leg: No edema.  Skin:    General: Skin is warm.     Capillary Refill: Capillary refill takes less than 2 seconds.  Neurological:     Mental Status: She is alert and oriented to person, place, and time.     ED Results / Procedures / Treatments   Labs (all labs ordered are listed, but only abnormal results are displayed) Labs Reviewed  CBC WITH DIFFERENTIAL/PLATELET - Abnormal; Notable for the following components:      Result Value   RDW 15.9 (*)    Platelets 528 (*)    All other components within normal limits  URINALYSIS, ROUTINE W REFLEX  MICROSCOPIC - Abnormal; Notable for the following components:   Color, Urine AMBER (*)    Specific Gravity, Urine 1.032 (*)    Bilirubin Urine SMALL (*)    Ketones, ur 80 (*)    Protein, ur 30 (*)    Leukocytes,Ua TRACE (*)    Bacteria, UA RARE (*)    Non Squamous Epithelial 0-5 (*)    All other components within normal limits  COMPREHENSIVE METABOLIC PANEL  LIPASE, BLOOD  MAGNESIUM    EKG EKG Interpretation  Date/Time:  Friday October 01 2019 16:36:28 EDT Ventricular Rate:  117 PR Interval:    QRS Duration: 100 QT Interval:  303 QTC Calculation: 423 R Axis:   90 Text Interpretation: Sinus tachycardia Right atrial enlargement Borderline right axis deviation Artifact in lead(s) I II III aVR aVL aVF V1 V3 V4 V5 V6 Confirmed by Davonna Belling [phone]) on 10/01/2019 4:55:24 PM   Radiology DG Chest Portable 1 View  Result Date: 10/01/2019 CLINICAL DATA:  Shortness of breath. Esophageal pain. History of COPD, lung cancer, stomach cancer, partial pneumonectomy. EXAM: PORTABLE CHEST 1 VIEW COMPARISON:  09/10/2019 FINDINGS: Lungs are hyperinflated. No focal consolidations or pleural effusions. No pulmonary edema. Surgical clips overlie the LEFT hilar region. IMPRESSION: No evidence for acute abnormality.  Hyperinflation. Electronically Signed   By: Nolon Nations M.D.   On: 10/01/2019 16:59    Procedures Procedures (including critical care time)  Medications Ordered in ED Medications  lactated ringers bolus 500 mL (0 mLs Intravenous Stopped 10/01/19 2012)  fentaNYL (SUBLIMAZE) injection 50 mcg (50 mcg Intravenous Given 10/01/19 2035)    ED Course  I have reviewed the triage vital signs and the nursing notes.  Pertinent labs & imaging results that were available during my care of the patient were reviewed by me and considered in my medical decision  making (see chart for details).    MDM Rules/Calculators/A&P                      Patient with shortness of breath chest pain  decreased oral intake.  Recently had been admitted to the hospital and had a severe esophagitis.  Has been treated with Diflucan and cefepime while inpatient.  States really been unable to eat and drink due to pain since then.  Clinically is dehydrated.  Has had decreased her oxygen also from 2 L to around 4 L.  Lab work overall reassuring but is orthostatic.  I think since she cannot take oral intake she would benefit from mission to the hospital though.  Still feels bad after IV fluids.  Will discuss with hospitalist. Final Clinical Impression(s) / ED Diagnoses Final diagnoses:  Dehydration  Esophagitis    Rx / DC Orders ED Discharge Orders    None       Davonna Belling, MD 10/01/19 2048

## 2019-10-02 DIAGNOSIS — I10 Essential (primary) hypertension: Secondary | ICD-10-CM

## 2019-10-02 DIAGNOSIS — J410 Simple chronic bronchitis: Secondary | ICD-10-CM | POA: Diagnosis not present

## 2019-10-02 DIAGNOSIS — Z20822 Contact with and (suspected) exposure to covid-19: Secondary | ICD-10-CM | POA: Diagnosis present

## 2019-10-02 DIAGNOSIS — E43 Unspecified severe protein-calorie malnutrition: Secondary | ICD-10-CM | POA: Diagnosis present

## 2019-10-02 DIAGNOSIS — K219 Gastro-esophageal reflux disease without esophagitis: Secondary | ICD-10-CM | POA: Diagnosis present

## 2019-10-02 DIAGNOSIS — K2210 Ulcer of esophagus without bleeding: Secondary | ICD-10-CM | POA: Diagnosis present

## 2019-10-02 DIAGNOSIS — B3781 Candidal esophagitis: Secondary | ICD-10-CM | POA: Diagnosis present

## 2019-10-02 DIAGNOSIS — K8689 Other specified diseases of pancreas: Secondary | ICD-10-CM

## 2019-10-02 DIAGNOSIS — J449 Chronic obstructive pulmonary disease, unspecified: Secondary | ICD-10-CM | POA: Diagnosis present

## 2019-10-02 DIAGNOSIS — M810 Age-related osteoporosis without current pathological fracture: Secondary | ICD-10-CM | POA: Diagnosis present

## 2019-10-02 DIAGNOSIS — R0600 Dyspnea, unspecified: Secondary | ICD-10-CM | POA: Diagnosis not present

## 2019-10-02 DIAGNOSIS — Z808 Family history of malignant neoplasm of other organs or systems: Secondary | ICD-10-CM | POA: Diagnosis not present

## 2019-10-02 DIAGNOSIS — Z66 Do not resuscitate: Secondary | ICD-10-CM | POA: Diagnosis present

## 2019-10-02 DIAGNOSIS — Z87891 Personal history of nicotine dependence: Secondary | ICD-10-CM | POA: Diagnosis not present

## 2019-10-02 DIAGNOSIS — K209 Esophagitis, unspecified without bleeding: Secondary | ICD-10-CM | POA: Diagnosis not present

## 2019-10-02 DIAGNOSIS — Z881 Allergy status to other antibiotic agents status: Secondary | ICD-10-CM | POA: Diagnosis not present

## 2019-10-02 DIAGNOSIS — R64 Cachexia: Secondary | ICD-10-CM | POA: Diagnosis present

## 2019-10-02 DIAGNOSIS — Z681 Body mass index (BMI) 19 or less, adult: Secondary | ICD-10-CM | POA: Diagnosis not present

## 2019-10-02 DIAGNOSIS — J9622 Acute and chronic respiratory failure with hypercapnia: Secondary | ICD-10-CM | POA: Diagnosis present

## 2019-10-02 DIAGNOSIS — Z9981 Dependence on supplemental oxygen: Secondary | ICD-10-CM | POA: Diagnosis not present

## 2019-10-02 DIAGNOSIS — J9602 Acute respiratory failure with hypercapnia: Secondary | ICD-10-CM | POA: Diagnosis not present

## 2019-10-02 DIAGNOSIS — R1310 Dysphagia, unspecified: Secondary | ICD-10-CM | POA: Diagnosis not present

## 2019-10-02 DIAGNOSIS — G92 Toxic encephalopathy: Secondary | ICD-10-CM | POA: Diagnosis not present

## 2019-10-02 DIAGNOSIS — M1990 Unspecified osteoarthritis, unspecified site: Secondary | ICD-10-CM | POA: Diagnosis present

## 2019-10-02 DIAGNOSIS — E860 Dehydration: Secondary | ICD-10-CM | POA: Diagnosis present

## 2019-10-02 DIAGNOSIS — K222 Esophageal obstruction: Secondary | ICD-10-CM | POA: Diagnosis not present

## 2019-10-02 DIAGNOSIS — Z888 Allergy status to other drugs, medicaments and biological substances status: Secondary | ICD-10-CM | POA: Diagnosis not present

## 2019-10-02 DIAGNOSIS — Z7951 Long term (current) use of inhaled steroids: Secondary | ICD-10-CM | POA: Diagnosis not present

## 2019-10-02 DIAGNOSIS — T402X1A Poisoning by other opioids, accidental (unintentional), initial encounter: Secondary | ICD-10-CM | POA: Diagnosis not present

## 2019-10-02 DIAGNOSIS — Z8249 Family history of ischemic heart disease and other diseases of the circulatory system: Secondary | ICD-10-CM | POA: Diagnosis not present

## 2019-10-02 DIAGNOSIS — Z833 Family history of diabetes mellitus: Secondary | ICD-10-CM | POA: Diagnosis not present

## 2019-10-02 DIAGNOSIS — I951 Orthostatic hypotension: Secondary | ICD-10-CM | POA: Diagnosis not present

## 2019-10-02 LAB — SARS CORONAVIRUS 2 (TAT 6-24 HRS): SARS Coronavirus 2: NEGATIVE

## 2019-10-02 LAB — HIV ANTIBODY (ROUTINE TESTING W REFLEX): HIV Screen 4th Generation wRfx: NONREACTIVE

## 2019-10-02 MED ORDER — HYDROMORPHONE HCL 1 MG/ML IJ SOLN
0.5000 mg | INTRAMUSCULAR | Status: DC | PRN
  Administered 2019-10-02 – 2019-10-03 (×4): 1 mg via INTRAVENOUS
  Filled 2019-10-02 (×4): qty 1

## 2019-10-02 MED ORDER — FENTANYL CITRATE (PF) 100 MCG/2ML IJ SOLN
25.0000 ug | INTRAMUSCULAR | Status: AC | PRN
  Administered 2019-10-02 (×3): 25 ug via INTRAVENOUS
  Filled 2019-10-02 (×3): qty 2

## 2019-10-02 MED ORDER — SODIUM CHLORIDE 0.9 % IV SOLN: INTRAVENOUS | Status: AC

## 2019-10-02 MED ORDER — PANTOPRAZOLE SODIUM 40 MG IV SOLR
40.0000 mg | Freq: Two times a day (BID) | INTRAVENOUS | Status: DC
  Administered 2019-10-02 – 2019-10-15 (×27): 40 mg via INTRAVENOUS
  Filled 2019-10-02 (×27): qty 40

## 2019-10-02 MED ORDER — MORPHINE SULFATE (PF) 2 MG/ML IV SOLN
2.0000 mg | INTRAVENOUS | Status: DC | PRN
  Administered 2019-10-02 (×2): 2 mg via INTRAVENOUS
  Filled 2019-10-02 (×2): qty 1

## 2019-10-02 MED ORDER — HYDROMORPHONE HCL 1 MG/ML IJ SOLN
0.5000 mg | INTRAMUSCULAR | Status: DC | PRN
  Administered 2019-10-02 (×2): 0.5 mg via INTRAVENOUS
  Filled 2019-10-02 (×2): qty 1

## 2019-10-02 NOTE — H&P (View-Only) (Signed)
Consultation  Referring Provider: Dr. Lonny Prude     Primary Care Physician:  [facility] Primary Gastroenterologist: Dr. Fuller Plan       Reason for Consultation: Odynophagia, vomiting            HPI:   Claudia Wiley is a 64 y.o. 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  Patient presented to the hospital 10/01/2019 with a complaint of dehydration and lightheadedness.  We are consulted for Odynophagia and vomiting.     Recent admission 09/10/2019-09/16/2019 for dysphagia.  During that admission EGD as below with severe mucosal changes with biopsies showing necrosis and fungus.  Patient was started on a high-dose PPI and scheduled Carafate as well as viscous Lidocaine.  Odynophagia slowly improved over the 4 to 5 days of admission.  She has continued on Pantoprazole 40 twice daily and Sucralfate suspension 1 g 4 times daily for a month as well as viscous lidocaine and Diflucan for 2 weeks.  It was recommend the patient stay on full liquids for several days with Ensure between meals.    Today, the patient tells me that she was doing okay for about a week and a half but then this past Tuesday, 09/28/2019 she started again with worsening of symptoms.  Tells me she was never able to move beyond a full liquid diet and had been drinking regular boost shakes to supplement, but then on Tuesday started vomiting again and tells me that she cannot hold anything down.  Even water seems to come back up a few minutes after swallowing, sometimes with no preceding nausea or any symptoms.  There is also a lot of pain up and down her esophagus and into her  stomach, excruciating when she tries to sip anything.  She had been able to take all of her medicines until then, but since Tuesday has had a lot of trouble.  Finally went to see her PCP who recommended she present back to the hospital.  Tells me she has continued with weight loss.  Denies use of NSAIDs.    Denies fever, chills, blood in her stool or symptoms that awaken her from sleep.  Recent GI history: 09/11/2019 EGD: - Profound mucosal changes in the entire esophagus characterized by circumferential black appearing mucosa, with endoscopic appearance highly suspicious for Acute Esophageal Necrosis. This was biopsied. Of note, there was essentially no bleeding at the biopsy sites. There was an abrupt transition to otherwise normal mucosal at the GEJ, again highly suggestive of Acute Esophagal Necrosis. - 3 cm hiatal hernia. - Gastroesophageal flap valve classified as Hill Grade IV (no fold, wide open lumen, hiatal hernia present). - Gastritis. Biopsied. - Non-bleeding duodenal ulcers with no stigmata of bleeding. Biopsied.  FINAL MICROSCOPIC DIAGNOSIS:   A. DUODENUM, BIOPSY:  - Duodenal mucosa with erosion and Brunner gland hyperplasia.  - No dysplasia or carcinoma.   B. STOMACH, BIOPSY:  - Antral and oxyntic mucosa with hyperemia and slight chronic  inflammation.  - Warthin-Starry negative for Helicobacter pylori.  - No intestinal metaplasia, dysplasia or carcinoma.   C. ESOPHAGUS, BIOPSY:  - Inflammatory exudate and necrotic debris.  - Abundant fungus and bacteria present.  - Scant partially  necrotic epithelium with atypia.  - See comment.  Past Medical History:  Diagnosis Date  . Anemia   . Arthritis   . Cancer [facility]) 2013   Upper left lung and stomach cancer  . COPD (chronic obstructive pulmonary disease) ([place])   . Esophageal stricture   . Family history of brain cancer   . Family history of melanoma   . Gastrinoma   . Gastrinoma   . GERD (gastroesophageal reflux  disease)   . Hypertension   . Osteoporosis   . Pancreatic insufficiency   . Pneumonia   . Vitamin D deficiency   . Weight loss, unintentional     Past Surgical History:  Procedure Laterality Date  . ABDOMINAL HYSTERECTOMY    . BIOPSY  09/11/2019   Procedure: BIOPSY;  Surgeon: Lavena Bullion, DO;  [facility];  Service: Gastroenterology;;  . BREAST LUMPECTOMY    . CESAREAN SECTION    . ESOPHAGOGASTRODUODENOSCOPY (EGD) WITH PROPOFOL N/A 09/11/2019   Procedure: ESOPHAGOGASTRODUODENOSCOPY (EGD) WITH PROPOFOL;  Surgeon: Lavena Bullion, DO;  [facility];  Service: Gastroenterology;  Laterality: N/A;  . LUNG SURGERY      Family History  Problem Relation Age of Onset  . Diabetes Sister        x 3  . Cancer Sister 17       brain that metastized to bone, stomach and other organs  . Melanoma Maternal Aunt   . Heart attack Father   . Pneumonia Brother   . Cancer Other 17       cancer in his chest - nephew  . Colon cancer Neg Hx   . Pancreatic cancer Neg Hx   . Rectal cancer Neg Hx   . Esophageal cancer Neg Hx     Social History   Tobacco Use  . Smoking status: Former Smoker    Packs/day: 0.25    Years: 2.00    Pack years: 0.50    Types: Cigarettes    Quit date: 05/30/2019    Years since quitting: 0.3  . Smokeless tobacco: Never Used  . Tobacco comment: Not ready  Substance Use Topics  . Alcohol use: No  . Drug use: No    Prior to Admission medications   Medication Sig Start Date End Date Taking? Authorizing Provider  acetaminophen (TYLENOL) 500 MG tablet Take 1,000 mg by mouth every 6 (six) hours as needed for moderate pain or headache.   Yes [provider]  albuterol (PROVENTIL HFA;VENTOLIN HFA) 108 (90 Base) MCG/ACT inhaler Inhale 2 puffs into the lungs every 6 (six) hours as needed for wheezing or shortness of breath.    Yes [provider]  albuterol (PROVENTIL) (2.5 MG/3ML) 0.083% nebulizer solution Take 2.5 mg by  nebulization every 6 (six) hours as needed for wheezing or shortness of breath.   Yes [provider]  budesonide-formoterol (SYMBICORT) 160-4.5 MCG/ACT inhaler Inhale 1 puff into the lungs daily. 06/18/18  Yes Regalado, Belkys A, MD  calcium-vitamin D (OSCAL WITH D) 500-200 MG-UNIT tablet Take 1 tablet by mouth 2 (two) times daily.   Yes [provider]  cholecalciferol (VITAMIN D3) 25 MCG (1000 UNIT) tablet Take 1,000 Units by mouth daily.   Yes [provider]  docusate sodium (COLACE) 100 MG capsule Take 2 capsules (200 mg total) by mouth 2 (two) times daily. 09/19/19  Yes Harold Hedge, MD  ENSURE (ENSURE) Take 237 mLs by mouth 4 (four) times daily. Between Meals.   Yes [provider]  fluticasone (FLONASE) 50 MCG/ACT nasal spray Place 1 spray into both nostrils daily as needed for allergies or rhinitis.   Yes [provider]  lidocaine (XYLOCAINE) 2 % solution Use as directed 15 mLs in the mouth or throat 3 (three) times daily before meals. 09/19/19 10/20/19 Yes Harold Hedge, MD  lipase/protease/amylase (CREON) 12000 units CPEP capsule Take 24,000 Units by mouth 4 (four) times daily.    Yes [provider]  mirtazapine (REMERON) 15 MG tablet Take 15 mg by mouth at bedtime.   Yes [provider]  Multiple Vitamin (MULTIVITAMIN) tablet Take 1 tablet by mouth daily.   Yes [provider]  pantoprazole (PROTONIX) 40 MG tablet Take 40 mg by mouth 2 (two) times daily.   Yes [provider]  sucralfate (CARAFATE) 1 GM/10ML suspension Take 10 mLs (1 g total) by mouth every 6 (six) hours. 09/19/19 10/20/19 Yes Harold Hedge, MD  tiotropium (SPIRIVA) 18 MCG inhalation capsule Place 18 mcg into inhaler and inhale daily.   Yes [provider]  tiZANidine (ZANAFLEX) 4 MG capsule Take 2 mg by mouth at bedtime.    Yes [provider]  pantoprazole (PROTONIX) 40 MG tablet Take 1 tablet (40 mg total) by mouth 2 (two)  times daily for 20 days, THEN 1 tablet (40 mg total) daily. Patient not taking: Reported on 10/01/2019 09/19/19 01/07/20  Harold Hedge, MD  polyethylene glycol (MIRALAX / GLYCOLAX) 17 g packet Take 17 g by mouth daily as needed for moderate constipation. Patient not taking: Reported on 10/01/2019 04/06/19   Desiree Hane, MD    Current Facility-Administered Medications  Medication Dose Route Frequency Provider Last Rate Last Admin  . acetaminophen (TYLENOL) tablet 650 mg  650 mg Oral Q6H PRN Lenore Cordia, MD       Or  . acetaminophen (TYLENOL) suppository 650 mg  650 mg Rectal Q6H PRN Zada Finders R, MD      . albuterol (PROVENTIL) (2.5 MG/3ML) 0.083% nebulizer solution 2.5 mg  2.5 mg Nebulization Q2H PRN Zada Finders R, MD   2.5 mg at 10/02/19 1022  . enoxaparin (LOVENOX) injection 30 mg  30 mg Subcutaneous Daily Zada Finders R, MD   30 mg at 10/02/19 1023  . feeding supplement (ENSURE ENLIVE) (ENSURE ENLIVE) liquid 237 mL  237 mL Oral QID Lenore Cordia, MD   237 mL at 10/02/19 0025  . fluconazole (DIFLUCAN) IVPB 400 mg  400 mg Intravenous QHS Zada Finders R, MD 100 mL/hr at 10/01/19 2333 400 mg at 10/01/19 2333  . lactated ringers 1,000 mL with potassium chloride 20 mEq infusion   Intravenous Continuous Lenore Cordia, MD 75 mL/hr at 10/02/19 0137 New Bag at 10/02/19 0137  . lipase/protease/amylase (CREON) capsule 24,000 Units  24,000 Units Oral QID Lenore Cordia, MD   24,000 Units at 10/02/19 0022  . mirtazapine (REMERON) tablet 15 mg  15 mg Oral QHS Lenore Cordia, MD   15 mg at 10/02/19 0023  . morphine 2 MG/ML injection 2-4 mg  2-4 mg Intravenous Q3H PRN Mariel Aloe, MD   2 mg at 10/02/19 1153  . ondansetron (ZOFRAN) tablet 4 mg  4 mg Oral Q6H PRN Lenore Cordia, MD       Or  . ondansetron (ZOFRAN) injection 4 mg  4 mg Intravenous Q6H PRN Zada Finders R, MD      . pantoprazole (PROTONIX) EC tablet 40 mg  40 mg  Oral BID Lenore Cordia, MD   40 mg at 10/02/19 0022  .  sodium chloride flush (NS) 0.9 % injection 3 mL  3 mL Intravenous Q12H Patel, Vishal R, MD      . sucralfate (CARAFATE) 1 GM/10ML suspension 1 g  1 g Oral Q6H Patel, Vishal R, MD   1 g at 10/02/19 0522  . umeclidinium bromide (INCRUSE ELLIPTA) 62.5 MCG/INH 1 puff  1 puff Inhalation Daily Lenore Cordia, MD   1 puff at 10/02/19 1020    Allergies as of 10/01/2019 - Review Complete 10/01/2019  Allergen Reaction Noted  . Azithromycin Other (See Comments) 11/11/2016  . Boniva [ibandronic acid] Other (See Comments) 10/01/2019  . Gabapentin Other (See Comments) 11/11/2016     Review of Systems:    Constitutional: No fever or chills Skin: No rash  Cardiovascular: +atypical chest pain Respiratory: No SOB  Gastrointestinal: See HPI and otherwise negative Genitourinary: No dysuria  Neurological: No headache, dizziness or syncope Musculoskeletal: No new muscle or joint pain Hematologic: No bleeding  Psychiatric: No history of depression or anxiety    Physical Exam:  Vital signs in last 24 hours: Temp:  [97.6 F (36.4 C)-98.9 F (37.2 C)] 98.6 F (37 C) (03/20 0814) Pulse Rate:  [93-118] 107 (03/20 0814) Resp:  [14-26] 18 (03/20 0814) BP: (101-152)/(77-101) 125/84 (03/20 0814) SpO2:  [99 %-100 %] 100 % (03/20 0814) Weight:  [34.2 kg-34.4 kg] 34.2 kg (03/20 0420) Last BM Date: 09/30/19 General:   Pleasant frail appearing AA appears to be in NAD, Well developed, Well nourished, alert and cooperative Head:  Normocephalic and atraumatic. Eyes:   PEERL, EOMI. No icterus. Conjunctiva pink. Ears:  Normal auditory acuity. Neck:  Supple Throat: Oral cavity and pharynx without inflammation, swelling or lesion.  Lungs: Respirations even and unlabored. Lungs clear to auscultation bilaterally.   No wheezes, crackles, or rhonchi.  Heart: Normal S1, S2. No MRG. Regular rate and rhythm. No peripheral edema, cyanosis or pallor.  Abdomen:  Soft, nondistended, moderate epigastric ttp. No rebound or  guarding. Normal bowel sounds. No appreciable masses or hepatomegaly. Rectal:  Not performed.  Msk:  Symmetrical without gross deformities. Peripheral pulses intact.  Extremities:  Without edema, no deformity or joint abnormality. Neurologic:  Alert and  oriented x4;  grossly normal neurologically. Skin:   Dry and intact without significant lesions or rashes. Psychiatric: Demonstrates good judgement and reason without abnormal affect or behaviors.   LAB RESULTS: Recent Labs    10/01/19 1644  WBC 9.5  HGB 13.1  HCT 42.0  PLT 528*   BMET Recent Labs    10/01/19 1644  NA 144  K 3.7  CL 103  CO2 30  GLUCOSE 98  BUN 19  CREATININE 0.53  CALCIUM 9.2   LFT Recent Labs    10/01/19 1644  PROT 6.8  ALBUMIN 3.8  AST 26  ALT 26  ALKPHOS 85  BILITOT 0.7   STUDIES: DG Chest Portable 1 View  Result Date: 10/01/2019 CLINICAL DATA:  Shortness of breath. Esophageal pain. History of COPD, lung cancer, stomach cancer, partial pneumonectomy. EXAM: PORTABLE CHEST 1 VIEW COMPARISON:  09/10/2019 FINDINGS: Lungs are hyperinflated. No focal consolidations or pleural effusions. No pulmonary edema. Surgical clips overlie the LEFT hilar region. IMPRESSION: No evidence for acute abnormality.  Hyperinflation. Electronically Signed   By: Nolon Nations M.D.   On: 10/01/2019 16:59     Impression / Plan:  Impression: 1.  Odynophagia: History of Candida esophagitis as  well as some necrosis on recent EGD at the end of February, see HPI; concern for the same worse worse 2.  COPD with chronic respiratory failure: On 2 L via nasal cannula 3.  Pancreatic insufficiency 4.  Anxiety/depression  Plan: 1.  Discontinued Creon, Remeron and pantoprazole tablets.  Converted patient to pantoprazole 40 mg IV twice daily.  Will allow hospitalist to find a substitute for Remeron that can be given as liquid or IV.  Patient can hold on Creon for now. 2.  Patient scheduled for an EGD tomorrow 10/03/2019 with Dr.  Hilarie Fredrickson.  Did discuss risks, benefits, limitations and alternatives and patient agrees to proceed.  She has tested negative for Covid. 3.  Patient should remain n.p.o. 4. Please await any further recommendations from Dr. Hilarie Fredrickson later today  Thank you for your kind consultation, we will continue to follow.  Lavone Nian [facility]  10/02/2019, 12:07 PM

## 2019-10-02 NOTE — Consult Note (Signed)
Consultation  Referring Provider: Dr. Lonny Prude     Primary Care Physician:  [facility] Primary Gastroenterologist: Dr. Fuller Plan       Reason for Consultation: Odynophagia, vomiting            HPI:   Claudia Wiley is a 64 y.o. 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  Patient presented to the hospital 10/01/2019 with a complaint of dehydration and lightheadedness.  We are consulted for Odynophagia and vomiting.     Recent admission 09/10/2019-09/16/2019 for dysphagia.  During that admission EGD as below with severe mucosal changes with biopsies showing necrosis and fungus.  Patient was started on a high-dose PPI and scheduled Carafate as well as viscous Lidocaine.  Odynophagia slowly improved over the 4 to 5 days of admission.  She has continued on Pantoprazole 40 twice daily and Sucralfate suspension 1 g 4 times daily for a month as well as viscous lidocaine and Diflucan for 2 weeks.  It was recommend the patient stay on full liquids for several days with Ensure between meals.    Today, the patient tells me that she was doing okay for about a week and a half but then this past Tuesday, 09/28/2019 she started again with worsening of symptoms.  Tells me she was never able to move beyond a full liquid diet and had been drinking regular boost shakes to supplement, but then on Tuesday started vomiting again and tells me that she cannot hold anything down.  Even water seems to come back up a few minutes after swallowing, sometimes with no preceding nausea or any symptoms.  There is also a lot of pain up and down her esophagus and into her  stomach, excruciating when she tries to sip anything.  She had been able to take all of her medicines until then, but since Tuesday has had a lot of trouble.  Finally went to see her PCP who recommended she present back to the hospital.  Tells me she has continued with weight loss.  Denies use of NSAIDs.    Denies fever, chills, blood in her stool or symptoms that awaken her from sleep.  Recent GI history: 09/11/2019 EGD: - Profound mucosal changes in the entire esophagus characterized by circumferential black appearing mucosa, with endoscopic appearance highly suspicious for Acute Esophageal Necrosis. This was biopsied. Of note, there was essentially no bleeding at the biopsy sites. There was an abrupt transition to otherwise normal mucosal at the GEJ, again highly suggestive of Acute Esophagal Necrosis. - 3 cm hiatal hernia. - Gastroesophageal flap valve classified as Hill Grade IV (no fold, wide open lumen, hiatal hernia present). - Gastritis. Biopsied. - Non-bleeding duodenal ulcers with no stigmata of bleeding. Biopsied.  FINAL MICROSCOPIC DIAGNOSIS:   A. DUODENUM, BIOPSY:  - Duodenal mucosa with erosion and Brunner gland hyperplasia.  - No dysplasia or carcinoma.   B. STOMACH, BIOPSY:  - Antral and oxyntic mucosa with hyperemia and slight chronic  inflammation.  - Warthin-Starry negative for Helicobacter pylori.  - No intestinal metaplasia, dysplasia or carcinoma.   C. ESOPHAGUS, BIOPSY:  - Inflammatory exudate and necrotic debris.  - Abundant fungus and bacteria present.  - Scant partially  necrotic epithelium with atypia.  - See comment.  Past Medical History:  Diagnosis Date  . Anemia   . Arthritis   . Cancer [facility]) 2013   Upper left lung and stomach cancer  . COPD (chronic obstructive pulmonary disease) ([place])   . Esophageal stricture   . Family history of brain cancer   . Family history of melanoma   . Gastrinoma   . Gastrinoma   . GERD (gastroesophageal reflux  disease)   . Hypertension   . Osteoporosis   . Pancreatic insufficiency   . Pneumonia   . Vitamin D deficiency   . Weight loss, unintentional     Past Surgical History:  Procedure Laterality Date  . ABDOMINAL HYSTERECTOMY    . BIOPSY  09/11/2019   Procedure: BIOPSY;  Surgeon: Lavena Bullion, DO;  Location: [facility];  Service: Gastroenterology;;  . BREAST LUMPECTOMY    . CESAREAN SECTION    . ESOPHAGOGASTRODUODENOSCOPY (EGD) WITH PROPOFOL N/A 09/11/2019   Procedure: ESOPHAGOGASTRODUODENOSCOPY (EGD) WITH PROPOFOL;  Surgeon: Lavena Bullion, DO;  Location: [facility];  Service: Gastroenterology;  Laterality: N/A;  . LUNG SURGERY      Family History  Problem Relation Age of Onset  . Diabetes Sister        x 3  . Cancer Sister 55       brain that metastized to bone, stomach and other organs  . Melanoma Maternal Aunt   . Heart attack Father   . Pneumonia Brother   . Cancer Other 17       cancer in his chest - nephew  . Colon cancer Neg Hx   . Pancreatic cancer Neg Hx   . Rectal cancer Neg Hx   . Esophageal cancer Neg Hx     Social History   Tobacco Use  . Smoking status: Former Smoker    Packs/day: 0.25    Years: 2.00    Pack years: 0.50    Types: Cigarettes    Quit date: 05/30/2019    Years since quitting: 0.3  . Smokeless tobacco: Never Used  . Tobacco comment: Not ready  Substance Use Topics  . Alcohol use: No  . Drug use: No    Prior to Admission medications   Medication Sig Start Date End Date Taking? Authorizing Provider  acetaminophen (TYLENOL) 500 MG tablet Take 1,000 mg by mouth every 6 (six) hours as needed for moderate pain or headache.   Yes [provider]  albuterol (PROVENTIL HFA;VENTOLIN HFA) 108 (90 Base) MCG/ACT inhaler Inhale 2 puffs into the lungs every 6 (six) hours as needed for wheezing or shortness of breath.    Yes [provider]  albuterol (PROVENTIL) (2.5 MG/3ML) 0.083% nebulizer solution Take 2.5 mg by  nebulization every 6 (six) hours as needed for wheezing or shortness of breath.   Yes [provider]  budesonide-formoterol (SYMBICORT) 160-4.5 MCG/ACT inhaler Inhale 1 puff into the lungs daily. 06/18/18  Yes Regalado, Belkys A, MD  calcium-vitamin D (OSCAL WITH D) 500-200 MG-UNIT tablet Take 1 tablet by mouth 2 (two) times daily.   Yes [provider]  cholecalciferol (VITAMIN D3) 25 MCG (1000 UNIT) tablet Take 1,000 Units by mouth daily.   Yes [provider]  docusate sodium (COLACE) 100 MG capsule Take 2 capsules (200 mg total) by mouth 2 (two) times daily. 09/19/19  Yes Harold Hedge, MD  ENSURE (ENSURE) Take 237 mLs by mouth 4 (four) times daily. Between Meals.   Yes [provider]  fluticasone (FLONASE) 50 MCG/ACT nasal spray Place 1 spray into both nostrils daily as needed for allergies or rhinitis.   Yes [provider]  lidocaine (XYLOCAINE) 2 % solution Use as directed 15 mLs in the mouth or throat 3 (three) times daily before meals. 09/19/19 10/20/19 Yes Harold Hedge, MD  lipase/protease/amylase (CREON) 12000 units CPEP capsule Take 24,000 Units by mouth 4 (four) times daily.    Yes [provider]  mirtazapine (REMERON) 15 MG tablet Take 15 mg by mouth at bedtime.   Yes [provider]  Multiple Vitamin (MULTIVITAMIN) tablet Take 1 tablet by mouth daily.   Yes [provider]  pantoprazole (PROTONIX) 40 MG tablet Take 40 mg by mouth 2 (two) times daily.   Yes [provider]  sucralfate (CARAFATE) 1 GM/10ML suspension Take 10 mLs (1 g total) by mouth every 6 (six) hours. 09/19/19 10/20/19 Yes Harold Hedge, MD  tiotropium (SPIRIVA) 18 MCG inhalation capsule Place 18 mcg into inhaler and inhale daily.   Yes [provider]  tiZANidine (ZANAFLEX) 4 MG capsule Take 2 mg by mouth at bedtime.    Yes [provider]  pantoprazole (PROTONIX) 40 MG tablet Take 1 tablet (40 mg total) by mouth 2 (two)  times daily for 20 days, THEN 1 tablet (40 mg total) daily. Patient not taking: Reported on 10/01/2019 09/19/19 01/07/20  Harold Hedge, MD  polyethylene glycol (MIRALAX / GLYCOLAX) 17 g packet Take 17 g by mouth daily as needed for moderate constipation. Patient not taking: Reported on 10/01/2019 04/06/19   Desiree Hane, MD    Current Facility-Administered Medications  Medication Dose Route Frequency Provider Last Rate Last Admin  . acetaminophen (TYLENOL) tablet 650 mg  650 mg Oral Q6H PRN Lenore Cordia, MD       Or  . acetaminophen (TYLENOL) suppository 650 mg  650 mg Rectal Q6H PRN Zada Finders R, MD      . albuterol (PROVENTIL) (2.5 MG/3ML) 0.083% nebulizer solution 2.5 mg  2.5 mg Nebulization Q2H PRN Zada Finders R, MD   2.5 mg at 10/02/19 1022  . enoxaparin (LOVENOX) injection 30 mg  30 mg Subcutaneous Daily Zada Finders R, MD   30 mg at 10/02/19 1023  . feeding supplement (ENSURE ENLIVE) (ENSURE ENLIVE) liquid 237 mL  237 mL Oral QID Lenore Cordia, MD   237 mL at 10/02/19 0025  . fluconazole (DIFLUCAN) IVPB 400 mg  400 mg Intravenous QHS Zada Finders R, MD 100 mL/hr at 10/01/19 2333 400 mg at 10/01/19 2333  . lactated ringers 1,000 mL with potassium chloride 20 mEq infusion   Intravenous Continuous Lenore Cordia, MD 75 mL/hr at 10/02/19 0137 New Bag at 10/02/19 0137  . lipase/protease/amylase (CREON) capsule 24,000 Units  24,000 Units Oral QID Lenore Cordia, MD   24,000 Units at 10/02/19 0022  . mirtazapine (REMERON) tablet 15 mg  15 mg Oral QHS Lenore Cordia, MD   15 mg at 10/02/19 0023  . morphine 2 MG/ML injection 2-4 mg  2-4 mg Intravenous Q3H PRN Mariel Aloe, MD   2 mg at 10/02/19 1153  . ondansetron (ZOFRAN) tablet 4 mg  4 mg Oral Q6H PRN Lenore Cordia, MD       Or  . ondansetron (ZOFRAN) injection 4 mg  4 mg Intravenous Q6H PRN Zada Finders R, MD      . pantoprazole (PROTONIX) EC tablet 40 mg  40 mg  Oral BID Lenore Cordia, MD   40 mg at 10/02/19 0022  .  sodium chloride flush (NS) 0.9 % injection 3 mL  3 mL Intravenous Q12H Patel, Vishal R, MD      . sucralfate (CARAFATE) 1 GM/10ML suspension 1 g  1 g Oral Q6H Patel, Vishal R, MD   1 g at 10/02/19 0522  . umeclidinium bromide (INCRUSE ELLIPTA) 62.5 MCG/INH 1 puff  1 puff Inhalation Daily Lenore Cordia, MD   1 puff at 10/02/19 1020    Allergies as of 10/01/2019 - Review Complete 10/01/2019  Allergen Reaction Noted  . Azithromycin Other (See Comments) 11/11/2016  . Boniva [ibandronic acid] Other (See Comments) 10/01/2019  . Gabapentin Other (See Comments) 11/11/2016     Review of Systems:    Constitutional: No fever or chills Skin: No rash  Cardiovascular: +atypical chest pain Respiratory: No SOB  Gastrointestinal: See HPI and otherwise negative Genitourinary: No dysuria  Neurological: No headache, dizziness or syncope Musculoskeletal: No new muscle or joint pain Hematologic: No bleeding  Psychiatric: No history of depression or anxiety    Physical Exam:  Vital signs in last 24 hours: Temp:  [97.6 F (36.4 C)-98.9 F (37.2 C)] 98.6 F (37 C) (03/20 0814) Pulse Rate:  [93-118] 107 (03/20 0814) Resp:  [14-26] 18 (03/20 0814) BP: (101-152)/(77-101) 125/84 (03/20 0814) SpO2:  [99 %-100 %] 100 % (03/20 0814) Weight:  [34.2 kg-34.4 kg] 34.2 kg (03/20 0420) Last BM Date: 09/30/19 General:   Pleasant frail appearing AA appears to be in NAD, Well developed, Well nourished, alert and cooperative Head:  Normocephalic and atraumatic. Eyes:   PEERL, EOMI. No icterus. Conjunctiva pink. Ears:  Normal auditory acuity. Neck:  Supple Throat: Oral cavity and pharynx without inflammation, swelling or lesion.  Lungs: Respirations even and unlabored. Lungs clear to auscultation bilaterally.   No wheezes, crackles, or rhonchi.  Heart: Normal S1, S2. No MRG. Regular rate and rhythm. No peripheral edema, cyanosis or pallor.  Abdomen:  Soft, nondistended, moderate epigastric ttp. No rebound or  guarding. Normal bowel sounds. No appreciable masses or hepatomegaly. Rectal:  Not performed.  Msk:  Symmetrical without gross deformities. Peripheral pulses intact.  Extremities:  Without edema, no deformity or joint abnormality. Neurologic:  Alert and  oriented x4;  grossly normal neurologically. Skin:   Dry and intact without significant lesions or rashes. Psychiatric: Demonstrates good judgement and reason without abnormal affect or behaviors.   LAB RESULTS: Recent Labs    10/01/19 1644  WBC 9.5  HGB 13.1  HCT 42.0  PLT 528*   BMET Recent Labs    10/01/19 1644  NA 144  K 3.7  CL 103  CO2 30  GLUCOSE 98  BUN 19  CREATININE 0.53  CALCIUM 9.2   LFT Recent Labs    10/01/19 1644  PROT 6.8  ALBUMIN 3.8  AST 26  ALT 26  ALKPHOS 85  BILITOT 0.7   STUDIES: DG Chest Portable 1 View  Result Date: 10/01/2019 CLINICAL DATA:  Shortness of breath. Esophageal pain. History of COPD, lung cancer, stomach cancer, partial pneumonectomy. EXAM: PORTABLE CHEST 1 VIEW COMPARISON:  09/10/2019 FINDINGS: Lungs are hyperinflated. No focal consolidations or pleural effusions. No pulmonary edema. Surgical clips overlie the LEFT hilar region. IMPRESSION: No evidence for acute abnormality.  Hyperinflation. Electronically Signed   By: Nolon Nations M.D.   On: 10/01/2019 16:59     Impression / Plan:  Impression: 1.  Odynophagia: History of Candida esophagitis as  well as some necrosis on recent EGD at the end of February, see HPI; concern for the same worse worse 2.  COPD with chronic respiratory failure: On 2 L via nasal cannula 3.  Pancreatic insufficiency 4.  Anxiety/depression  Plan: 1.  Discontinued Creon, Remeron and pantoprazole tablets.  Converted patient to pantoprazole 40 mg IV twice daily.  Will allow hospitalist to find a substitute for Remeron that can be given as liquid or IV.  Patient can hold on Creon for now. 2.  Patient scheduled for an EGD tomorrow 10/03/2019 with Dr.  Hilarie Fredrickson.  Did discuss risks, benefits, limitations and alternatives and patient agrees to proceed.  She has tested negative for Covid. 3.  Patient should remain n.p.o. 4. Please await any further recommendations from Dr. Hilarie Fredrickson later today  Thank you for your kind consultation, we will continue to follow.  Lavone Nian [facility]  10/02/2019, 12:07 PM

## 2019-10-02 NOTE — Progress Notes (Signed)
PROGRESS NOTE    Claudia Wiley  [id number] DOB: May 05, 1956 DOA: 10/01/2019 PCP: [facility]   Brief Narrative: Claudia Wiley is a 64 y.o. female with a history COPD/chronic respiratory failure with hypoxia on 2 L supplemental O2 via [place], history of lung cancer s/p LUL lobectomy, gastrinoma s/p Whipple's procedure, anxiety/depression, esophageal stricture and dilatation July 2019, hypertension, and recent admission for severe Candida esophagitis with esophageal necrosis. She presented secondary to dehydration secondary to recurrent emesis and lack of intake secondary to odynophagia.   Assessment & Plan:   Principal Problem:   Orthostasis Active Problems:   Essential hypertension   Pancreatic insufficiency   COPD (chronic obstructive pulmonary disease) (HCC)   Candida esophagitis (HCC)   Candida esophagitis Odynophagia Duodenal ulcers Patient previous treated with full course of fluconazole. Concern for possible reoccurrence. Patient is very symptomatic with severe pain with swallowing. Unable to tolerate oral intake at this time -Gi consult -Continue Fluconazole IV -Morphine IV prn -NPO  Orthostasis Secondary to dehydration. Will manage with IV fluids  Dehydration Secondary to inability to maintain oral intake -IV fluids  COPD Chronic respiratory failure with hypoxia -Continue 2L O2 via [place]   DVT prophylaxis: Lovenox Code Status:   Code Status: DNR Family Communication: None at bedside Disposition Plan: Discharge pending improvement of odynophagia   Consultants:   [facility]  Procedures:   None  Antimicrobials:  Fluconazole    Subjective: Chest pain that radiates down to her umbilicus. She describes it as burning.  Objective: Vitals:   10/02/19 0420 10/02/19 0420 10/02/19 0814 10/02/19 1317  BP:  123/80 125/84 120/70  Pulse:  (!) 108 (!) 107 99  Resp:   18 20  Temp:  98.6 F (37 C) 98.6 F (37 C) 98.2 F (36.8 C)  TempSrc:  Oral Oral  Oral  SpO2:  100% 100% 98%  Weight: 34.2 kg     Height:       No intake or output data in the 24 hours ending 10/02/19 1442 Filed Weights   10/01/19 1646 10/02/19 0420  Weight: 34.4 kg 34.2 kg    Examination:  General exam: Appears calm and uncomfortable Respiratory system: Clear to auscultation. Respiratory effort normal. Cardiovascular system: S1 & S2 heard, RRR. No murmurs, rubs, gallops or clicks. Gastrointestinal system: Abdomen is nondistended, soft and tender. No organomegaly or masses felt. Normal bowel sounds heard. Central nervous system: Alert and oriented. No focal neurological deficits. Extremities: No edema. No calf tenderness Skin: No cyanosis. No rashes Psychiatry: Judgement and insight appear normal. Mood & affect appropriate.     Data Reviewed: I have personally reviewed following labs and imaging studies  CBC: Recent Labs  Lab 10/01/19 1644  WBC 9.5  NEUTROABS 4.4  HGB 13.1  HCT 42.0  MCV 92.9  PLT 0000000*   Basic Metabolic Panel: Recent Labs  Lab 10/01/19 1644  NA 144  K 3.7  CL 103  CO2 30  GLUCOSE 98  BUN 19  CREATININE 0.53  CALCIUM 9.2  MG 1.7   GFR: Estimated Creatinine Clearance: 39.9 mL/min (by C-G formula based on SCr of 0.53 mg/dL). Liver Function Tests: Recent Labs  Lab 10/01/19 1644  AST 26  ALT 26  ALKPHOS 85  BILITOT 0.7  PROT 6.8  ALBUMIN 3.8   Recent Labs  Lab 10/01/19 1644  LIPASE 21   No results for input(s): AMMONIA in the last 168 hours. Coagulation Profile: No results for input(s): INR, PROTIME in the last 168 hours. Cardiac  Enzymes: No results for input(s): CKTOTAL, CKMB, CKMBINDEX, TROPONINI in the last 168 hours. BNP (last 3 results) No results for input(s): PROBNP in the last 8760 hours. HbA1C: No results for input(s): HGBA1C in the last 72 hours. CBG: No results for input(s): GLUCAP in the last 168 hours. Lipid Profile: No results for input(s): CHOL, HDL, LDLCALC, TRIG, CHOLHDL, LDLDIRECT in the  last 72 hours. Thyroid Function Tests: No results for input(s): TSH, T4TOTAL, FREET4, T3FREE, THYROIDAB in the last 72 hours. Anemia Panel: No results for input(s): VITAMINB12, FOLATE, FERRITIN, TIBC, IRON, RETICCTPCT in the last 72 hours. Sepsis Labs: No results for input(s): PROCALCITON, LATICACIDVEN in the last 168 hours.  Recent Results (from the past 240 hour(s))  SARS CORONAVIRUS 2 (TAT 6-24 HRS) Nasopharyngeal Nasopharyngeal Swab     Status: None   Collection Time: 10/01/19  9:58 PM   Specimen: Nasopharyngeal Swab  Result Value Ref Range Status   SARS Coronavirus 2 NEGATIVE NEGATIVE Final    Comment: (NOTE) SARS-CoV-2 target nucleic acids are NOT DETECTED. The SARS-CoV-2 RNA is generally detectable in upper and lower respiratory specimens during the acute phase of infection. Negative results do not preclude SARS-CoV-2 infection, do not rule out co-infections with other pathogens, and should not be used as the sole basis for treatment or other patient management decisions. Negative results must be combined with clinical observations, patient history, and epidemiological information. The expected result is Negative. Fact Sheet for Patients: SugarRoll.be Fact Sheet for Healthcare Providers: https://www.woods-mathews.com/ This test is not yet approved or cleared by the [place] FDA and  has been authorized for detection and/or diagnosis of SARS-CoV-2 by FDA under an Emergency Use Authorization (EUA). This EUA will remain  in effect (meaning this test can be used) for the duration of the COVID-19 declaration under Section 56 4(b)(1) of the Act, 21 U.S.C. section 360bbb-3(b)(1), unless the authorization is terminated or revoked sooner. Performed at [facility], [place] [address], [place], [place] [address]          Radiology Studies: DG Chest Portable 1 View  Result Date: 10/01/2019 CLINICAL DATA:  Shortness of breath.  Esophageal pain. History of COPD, lung cancer, stomach cancer, partial pneumonectomy. EXAM: PORTABLE CHEST 1 VIEW COMPARISON:  09/10/2019 FINDINGS: Lungs are hyperinflated. No focal consolidations or pleural effusions. No pulmonary edema. Surgical clips overlie the LEFT hilar region. IMPRESSION: No evidence for acute abnormality.  Hyperinflation. Electronically Signed   By: Nolon Nations M.D.   On: 10/01/2019 16:59        Scheduled Meds: . enoxaparin (LOVENOX) injection  30 mg Subcutaneous Daily  . feeding supplement (ENSURE ENLIVE)  237 mL Oral QID  . pantoprazole (PROTONIX) IV  40 mg Intravenous Q12H  . sodium chloride flush  3 mL Intravenous Q12H  . sucralfate  1 g Oral Q6H  . umeclidinium bromide  1 puff Inhalation Daily   Continuous Infusions: . fluconazole (DIFLUCAN) IV 400 mg (10/01/19 2333)     LOS: 0 days     Cordelia Poche, MD [facility] 10/02/2019, 2:42 PM  If 7PM-7AM, please contact night-coverage [email or web address]

## 2019-10-02 NOTE — Progress Notes (Signed)
Initial Nutrition Assessment  DOCUMENTATION CODES:   Underweight  INTERVENTION:  Advance diet as medically feasible  Monitor for need of nutrition support   NUTRITION DIAGNOSIS:   Inadequate oral intake related to altered GI function(dysphagia and recurrent emesis due to esophageal necrosis) as evidenced by per patient/family report, NPO status, percent weight loss.    GOAL:   Patient will meet greater than or equal to 90% of their needs    MONITOR:   Labs, Diet advancement, Weight trends, I & O's  REASON FOR ASSESSMENT:   Malnutrition Screening Tool    ASSESSMENT:  RD working remotely.  64 year old female with past medical history of COPD, chronic respiratory failure with hypoxia on 2 L O2 at home, history of lung cancer s/p LUL lobectomy, gastrinoma s/p Whipple procedure, esophageal stricture (dilation 2019), HTN, and recent admission for severe Candida esophagitis with esophageal necrosis. Patient presented to ED for recurrent emesis and lack of intake secondary to odynophagia.  Patient admitted on 3/19 for orthostasis  Patient had EGD on 2/27 suggestive of acute esophageal necrosis Findings: duodenal mucosa with erosion and Brunner gland hyperplasia; stomach biopsy with antral and oxyntic mucosa with hyperemia and slight chronic inflammation; esophagus biopsy with inflammatory exudate and necrotic debris, abundant fungus and bacteria present, scant partially necrotic epithelium with atypia  Patient was recently discharged on 03/04, reports improving at home and started to develop pain with swallowing and difficult time with po intake. She reports symptoms of dysphagia, food sticking in esophagus and then being vomiting back up.  Per notes: -EGD planned for 3/21 -avoid NGT placement given recent esophageal injury -may need TPN per GI  Current wt 75.24 lbs Per history, on 04/06/19 pt wt 87.12 lbs, on 06/29/19 pt wt 86.9 lbs, on 09/19/19 pt wt 75.68 lbs. This  indicates ~12 lb (14%) wt loss in 6 months which is significant. Highly suspect malnutrition given history of poor po secondary to recurrent emesis, weight history, recent severe Candida esophagitis and esophageal necrosis, however unable to identify at this time without NFPE.   Medications reviewed and include: Protonix, Carafate IVF: NaCl Diflucan Labs reviewed  NUTRITION - FOCUSED PHYSICAL EXAM: Unable to complete at this time, RD working remotely.  Diet Order:   Diet Order            Diet NPO time specified Except for: Sips with Meds  Diet effective now              EDUCATION NEEDS:   No education needs have been identified at this time  Skin:  Skin Assessment: Reviewed RN Assessment  Last BM:  3/18  Height:   Ht Readings from Last 1 Encounters:  10/01/19 4' 11.02" (1.499 m)    Weight:   Wt Readings from Last 1 Encounters:  10/02/19 34.2 kg    BMI:  Body mass index is 15.22 kg/m.  Estimated Nutritional Needs:   Kcal:  1450-1750  Protein:  73-88  Fluid:  >/= 1.4 L/day   Lajuan Lines, RD, LDN Clinical Nutrition After Hours/Weekend Pager # in [place]

## 2019-10-02 NOTE — Progress Notes (Signed)
Patient has not voided this shift, continues to deny the urge to voice.  Bladder scan performed, read as 363 mls.  Got patient up to [facility], voided 400 mls tea colored urine without difficulty.

## 2019-10-03 ENCOUNTER — Encounter (HOSPITAL_COMMUNITY)
Admission: EM | Disposition: A | Discharge status: Home Health | Payer: Self-pay | Source: Home / Self Care | Attending: Family Medicine

## 2019-10-03 ENCOUNTER — Encounter (HOSPITAL_COMMUNITY): Payer: Self-pay | Admitting: Internal Medicine

## 2019-10-03 ENCOUNTER — Inpatient Hospital Stay (HOSPITAL_COMMUNITY): Payer: No Typology Code available for payment source | Admitting: Certified Registered Nurse Anesthetist

## 2019-10-03 ENCOUNTER — Inpatient Hospital Stay (HOSPITAL_COMMUNITY): Payer: No Typology Code available for payment source

## 2019-10-03 DIAGNOSIS — T402X1A Poisoning by other opioids, accidental (unintentional), initial encounter: Secondary | ICD-10-CM

## 2019-10-03 HISTORY — PX: BIOPSY: SHX5522

## 2019-10-03 HISTORY — PX: ESOPHAGOGASTRODUODENOSCOPY (EGD) WITH PROPOFOL: SHX5813

## 2019-10-03 LAB — PHOSPHORUS: Phosphorus: 2.8 mg/dL (ref 2.5–4.6)

## 2019-10-03 LAB — COMPREHENSIVE METABOLIC PANEL
ALT: 21 U/L (ref 0–44)
AST: 21 U/L (ref 15–41)
Albumin: 2.8 g/dL — ABNORMAL LOW (ref 3.5–5.0)
Alkaline Phosphatase: 68 U/L (ref 38–126)
Anion gap: 9 (ref 5–15)
BUN: 8 mg/dL (ref 8–23)
CO2: 27 mmol/L (ref 22–32)
Calcium: 8.3 mg/dL — ABNORMAL LOW (ref 8.9–10.3)
Chloride: 107 mmol/L (ref 98–111)
Creatinine, Ser: 0.44 mg/dL (ref 0.44–1.00)
GFR calc Af Amer: >60 mL/min (ref >60)
GFR calc non Af Amer: >60 mL/min (ref >60)
Glucose, Bld: 107 mg/dL — ABNORMAL HIGH (ref 70–99)
Potassium: 4.1 mmol/L (ref 3.5–5.1)
Sodium: 143 mmol/L (ref 135–145)
Total Bilirubin: 0.7 mg/dL (ref 0.3–1.2)
Total Protein: 5.2 g/dL — ABNORMAL LOW (ref 6.5–8.1)

## 2019-10-03 LAB — GLUCOSE, CAPILLARY
Glucose-Capillary: 95 mg/dL (ref 70–99)
Glucose-Capillary: 88 mg/dL (ref 70–99)

## 2019-10-03 LAB — BLOOD GAS, ARTERIAL
Acid-base deficit: 0.8 mmol/L (ref 0.0–2.0)
Bicarbonate: 27 mmol/L (ref 20.0–28.0)
O2 Saturation: 96.9 %
Patient temperature: 98.6
pCO2 arterial: 63.5 mmHg — ABNORMAL HIGH (ref 32.0–48.0)
pH, Arterial: 7.251 — ABNORMAL LOW (ref 7.350–7.450)
pO2, Arterial: 91 mmHg (ref 83.0–108.0)

## 2019-10-03 LAB — MAGNESIUM: Magnesium: 1.4 mg/dL — ABNORMAL LOW (ref 1.7–2.4)

## 2019-10-03 SURGERY — ESOPHAGOGASTRODUODENOSCOPY (EGD) WITH PROPOFOL
Anesthesia: Monitor Anesthesia Care

## 2019-10-03 MED ORDER — HYDROMORPHONE HCL 1 MG/ML IJ SOLN
0.5000 mg | INTRAMUSCULAR | Status: DC | PRN
  Administered 2019-10-03: 17:00:00 0.5 mg via INTRAVENOUS
  Filled 2019-10-03: qty 0.5

## 2019-10-03 MED ORDER — SODIUM CHLORIDE 0.9 % IV SOLN: INTRAVENOUS | Status: DC

## 2019-10-03 MED ORDER — ONDANSETRON HCL 4 MG/2ML IJ SOLN
4.0000 mg | Freq: Four times a day (QID) | INTRAMUSCULAR | Status: DC | PRN
  Administered 2019-10-07 – 2019-10-19 (×6): 4 mg via INTRAVENOUS
  Filled 2019-10-03 (×5): qty 2

## 2019-10-03 MED ORDER — METOCLOPRAMIDE HCL 5 MG/ML IJ SOLN
5.0000 mg | Freq: Three times a day (TID) | INTRAMUSCULAR | Status: DC
  Administered 2019-10-03 – 2019-10-11 (×32): 5 mg via INTRAVENOUS
  Filled 2019-10-03 (×32): qty 2

## 2019-10-03 MED ORDER — SODIUM CHLORIDE 0.9 % IV SOLN: INTRAVENOUS | Status: AC

## 2019-10-03 MED ORDER — NALOXONE HCL 0.4 MG/ML IJ SOLN
INTRAMUSCULAR | Status: AC
  Administered 2019-10-03: 21:00:00 0.4 mg via INTRAVENOUS
  Filled 2019-10-03: qty 1

## 2019-10-03 MED ORDER — NALOXONE HCL 0.4 MG/ML IJ SOLN
0.4000 mg | Freq: Once | INTRAMUSCULAR | Status: AC
  Administered 2019-10-03: 0.4 mg via INTRAVENOUS

## 2019-10-03 MED ORDER — OXYCODONE HCL 5 MG/5ML PO SOLN
5.0000 mg | ORAL | Status: DC | PRN
  Filled 2019-10-03 (×3): qty 5

## 2019-10-03 MED ORDER — KETOROLAC TROMETHAMINE 30 MG/ML IJ SOLN
INTRAMUSCULAR | Status: AC
  Administered 2019-10-03: 17:00:00 15 mg
  Filled 2019-10-03: qty 1

## 2019-10-03 MED ORDER — OXYCODONE HCL 20 MG/ML PO CONC: 5.0000 mg | ORAL | Status: DC | PRN

## 2019-10-03 MED ORDER — SUCRALFATE 1 GM/10ML PO SUSP
1.0000 g | Freq: Three times a day (TID) | ORAL | Status: DC
  Administered 2019-10-03 – 2019-10-21 (×68): 1 g via ORAL
  Filled 2019-10-03 (×69): qty 10

## 2019-10-03 MED ORDER — MAGNESIUM SULFATE 2 GM/50ML IV SOLN
2.0000 g | Freq: Once | INTRAVENOUS | Status: AC
  Administered 2019-10-03: 15:00:00 2 g via INTRAVENOUS
  Filled 2019-10-03: qty 50

## 2019-10-03 MED ORDER — TRAVASOL 10 % IV SOLN
INTRAVENOUS | Status: DC
  Filled 2019-10-03: qty 403.2

## 2019-10-03 MED ORDER — HYDROMORPHONE HCL 1 MG/ML IJ SOLN
0.5000 mg | INTRAMUSCULAR | Status: DC | PRN
  Administered 2019-10-03: 15:00:00 1 mg via INTRAVENOUS
  Administered 2019-10-03: 0.5 mg via INTRAVENOUS
  Filled 2019-10-03 (×2): qty 1

## 2019-10-03 MED ORDER — PROPOFOL 10 MG/ML IV BOLUS
INTRAVENOUS | Status: DC | PRN
  Administered 2019-10-03 (×4): 10 mg via INTRAVENOUS

## 2019-10-03 MED ORDER — PROPOFOL 10 MG/ML IV BOLUS
INTRAVENOUS | Status: AC
  Filled 2019-10-03: qty 20

## 2019-10-03 MED ORDER — PROPOFOL 500 MG/50ML IV EMUL
INTRAVENOUS | Status: AC
  Filled 2019-10-03: qty 50

## 2019-10-03 MED ORDER — SODIUM CHLORIDE (PF) 0.9 % IJ SOLN
INTRAMUSCULAR | Status: AC
  Filled 2019-10-03: qty 50

## 2019-10-03 MED ORDER — KETOROLAC TROMETHAMINE 30 MG/ML IJ SOLN: 30.0000 mg | INTRAMUSCULAR | Status: DC | PRN

## 2019-10-03 MED ORDER — ACETAMINOPHEN 160 MG/5ML PO SOLN: 650.0000 mg | Freq: Four times a day (QID) | ORAL | Status: DC | PRN

## 2019-10-03 MED ORDER — NALOXONE HCL 0.4 MG/ML IJ SOLN: 0.4000 mg | INTRAMUSCULAR | Status: DC | PRN

## 2019-10-03 MED ORDER — INSULIN ASPART 100 UNIT/ML ~~LOC~~ SOLN: 0.0000 [IU] | Freq: Four times a day (QID) | SUBCUTANEOUS | Status: DC

## 2019-10-03 MED ORDER — ACETAMINOPHEN 650 MG RE SUPP
650.0000 mg | Freq: Four times a day (QID) | RECTAL | Status: DC | PRN
  Administered 2019-10-04: 650 mg via RECTAL
  Filled 2019-10-03: qty 1

## 2019-10-03 MED ORDER — IOHEXOL 300 MG/ML  SOLN
30.0000 mL | Freq: Once | INTRAMUSCULAR | Status: AC | PRN
  Administered 2019-10-03: 30 mL via ORAL

## 2019-10-03 MED ORDER — LACTATED RINGERS IV SOLN: INTRAVENOUS | Status: DC | PRN

## 2019-10-03 MED ORDER — FLUCONAZOLE IN SODIUM CHLORIDE 200-0.9 MG/100ML-% IV SOLN
200.0000 mg | Freq: Every day | INTRAVENOUS | Status: DC
  Administered 2019-10-03 – 2019-10-10 (×8): 200 mg via INTRAVENOUS
  Filled 2019-10-03 (×9): qty 100

## 2019-10-03 MED ORDER — SODIUM CHLORIDE 0.9 % IV BOLUS
250.0000 mL | Freq: Once | INTRAVENOUS | Status: AC
  Administered 2019-10-03: 250 mL via INTRAVENOUS

## 2019-10-03 MED ORDER — NALOXONE HCL 0.4 MG/ML IJ SOLN
INTRAMUSCULAR | Status: AC
  Filled 2019-10-03: qty 1

## 2019-10-03 MED ORDER — ONDANSETRON HCL 4 MG/5ML PO SOLN
4.0000 mg | Freq: Four times a day (QID) | ORAL | Status: DC | PRN
  Filled 2019-10-03: qty 5

## 2019-10-03 MED ORDER — PROPOFOL 500 MG/50ML IV EMUL
INTRAVENOUS | Status: DC | PRN
  Administered 2019-10-03: 50 ug/kg/min via INTRAVENOUS

## 2019-10-03 MED ORDER — KETOROLAC TROMETHAMINE 15 MG/ML IJ SOLN: 15.0000 mg | Freq: Once | INTRAMUSCULAR | Status: AC

## 2019-10-03 SURGICAL SUPPLY — 15 items

## 2019-10-03 NOTE — Progress Notes (Addendum)
PHARMACY - TOTAL PARENTERAL NUTRITION CONSULT NOTE   Indication: Intolerance to enteral feeding  Patient Measurements: Height: 4' 11.02" (149.9 cm) Weight: 79 lb 5.9 oz (36 kg) IBW/kg (Calculated) : 43.24 TPN AdjBW (KG): 34.4 Body mass index is 16.02 kg/m. Usual Weight:   Assessment: [name] with hx esophageal stricture (s/p dilation in July 2019) who was hospitalized from 2/25-3/7 for severe esophagitis and odynophagia as well as duodenal ulcers.  She presented back to the ED on 3/19 with c/o SOB and esophageal pain. EGD on 3/21 showed severe esophagitis with ulceration and esophageal stenosis. GI team recommends to avoid "NG tube placement given recent esophageal injury" and  start TPN for nutrition.  Glucose / Insulin: no hx DM - not currently on insulin, serum glucose 81 Electrolytes: phos 1.7, K 4.2; Mag low 1.8 after 2 gm bolus yesterday  Renal: scr 0.37 LFTs / TGs: LFTs wnl, Trig 103 Prealbumin / albumin:  - albumin low 2.6, prealbumin low 8.5 Intake / Output; MIVF:  - NS at 75 ml/hr GI Imaging: Surgeries / Procedures:  - 3/21 EGD: LA grade D esophagitis with contact bleeding from incisors. Scattered plaques consistent with candidiasis  Central access: pending PICC placement TPN start date: 3/22 if able to place PICC  Nutritional Goals (per RD recommendation on 3/20): Kcal:  1450-1750 Protein:  73-88 Fluid:  >/= 1.4 L/day  Goal TPN rate is 60 mL/hr (provides 81 g of protein and 1636 kcals per day)  Current Nutrition:  - ensure QID (only one dose charted as given 3/21)  Plan:   Now: Magnesium sulfate 1gm IV x1 and 10 mM NaPhos  At 1800:  - Start TPN at 30 mL/hr at 1800 - Electrolytes in TPN: 64mEq/L of Na, 57mEq/L of K, 73mEq/L of Ca, 12mEq/L of Mg, and 71mmol/L of Phos. Cl:Ac ratio 1:1 - Add trace elements to TPN; MVI on MWF only due to national backorder - Initiate Sensitive q6h SSI and adjust as needed  - Reduce MIVF to 45 mL/hr at 1800 -  Monitor TPN labs on Mon/Thurs, BMET, Mg and Phos in AM  Eudelia Bunch, Pharm.D [phone] 10/04/2019 9:19 AM

## 2019-10-03 NOTE — Progress Notes (Signed)
Around 1915 RN went to take vitals on patient and receive shift report, pt found to be normal. A&Ox4 and able to follow commands. RN then went to give patient night time medications and patient very lethargic, disoriented and unable to follow commands. Pupils reactive and vital signs stable at this time, CBG 88. Rapid response called and MD on call notified. .4mg  of narcan given per order. Patient then woke up screaming in pain. Patient more with it at this time but complaining of severe pain. House doc, [place] then paged to bedside. [place] to bedside where orders for ABG and CT of chest received. Waiting on results of testing. Patients vitals remain stable, alert and oriented although remains lethargic. Patient resting with minimal pain at this time. Will call family and update them regarding situation. Will continue to monitor patient.

## 2019-10-03 NOTE — Interval H&P Note (Signed)
History and Physical Interval Note: For egd this morning to eval dysphagia and odynophagia after acute esophageal necrosis was found at egd on 09/11/2019 HIGHER THAN BASELINE RISK.The nature of the procedure, as well as the risks, benefits, and alternatives were carefully and thoroughly reviewed with the patient. Ample time for discussion and questions allowed. The patient understood, was satisfied, and agreed to proceed.   10/03/2019 7:41 AM  Claudia Wiley  has presented today for surgery, with the diagnosis of Odynophagia.  The various methods of treatment have been discussed with the patient and family. After consideration of risks, benefits and other options for treatment, the patient has consented to  Procedure(s): ESOPHAGOGASTRODUODENOSCOPY (EGD) WITH PROPOFOL (N/A) as a surgical intervention.  The patient's history has been reviewed, patient examined, no change in status, stable for surgery.  I have reviewed the patient's chart and labs.  Questions were answered to the patient's satisfaction.     [name]

## 2019-10-03 NOTE — Progress Notes (Signed)
PROGRESS NOTE    Claudia Wiley  [id number] DOB: 1955/09/22 DOA: 10/01/2019 PCP: [facility], [facility]   Brief Narrative: Claudia Wiley is a 64 y.o. female with a history COPD/chronic respiratory failure with hypoxia on 2 L supplemental O2 via [place], history of lung cancer s/p LUL lobectomy, gastrinoma s/p Whipple's procedure, anxiety/depression, esophageal stricture and dilatation July 2019, hypertension, and recent admission for severe Candida esophagitis with esophageal necrosis. She presented secondary to dehydration secondary to recurrent emesis and lack of intake secondary to odynophagia.   Assessment & Plan:   Principal Problem:   Orthostasis Active Problems:   Essential hypertension   Pancreatic insufficiency   COPD (chronic obstructive pulmonary disease) (HCC)   Candida esophagitis (HCC)   Candida esophagitis Odynophagia Duodenal ulcers Patient previous treated with full course of fluconazole. Concern for possible reoccurrence. Patient is very symptomatic with severe pain with swallowing. GI consulted and performed EGD on 3/21. -GI recommendations: liquids only by mouth; alternate mode of nutrition to allow healing of upper GI tract -Continue Fluconazole IV -Morphine IV prn -Full liquids -Dietician consult -Pharmacy consult for TPN  Orthostasis Secondary to dehydration. Will manage with IV fluids  Dehydration Secondary to inability to maintain oral intake -IV fluids  COPD Chronic respiratory failure with hypoxia -Continue 2L O2 via [place]   DVT prophylaxis: Lovenox Code Status:   Code Status: Full Code Family Communication: None at bedside Disposition Plan: Discharge pending improvement of odynophagia, transition back to oral diet   Consultants:   [facility]  Procedures:   None  Antimicrobials:  Fluconazole    Subjective: Pain persistent. No vomiting.  Objective: Vitals:   10/03/19 0705 10/03/19 0811 10/03/19 0820 10/03/19 0830  BP: 125/69 (!)  110/49 (!) 105/51 (!) 103/51  Pulse: (!) 106 96 94 96  Resp: 15 14 12 18   Temp: 98.9 F (37.2 C) 97.7 F (36.5 C)    TempSrc: Temporal Temporal    SpO2: 96% 100% 100% 100%  Weight:      Height:        Intake/Output Summary (Last 24 hours) at 10/03/2019 0954 Last data filed at 10/03/2019 0807 Gross per 24 hour  Intake 1835.52 ml  Output 400 ml  Net 1435.52 ml   Filed Weights   10/01/19 1646 10/02/19 0420 10/03/19 0423  Weight: 34.4 kg 34.2 kg 36 kg    Examination:  General exam: Appears calm and comfortable Respiratory system: Clear to auscultation. Respiratory effort normal. Cardiovascular system: S1 & S2 heard, RRR. No murmurs, rubs, gallops or clicks. Gastrointestinal system: Abdomen is nondistended, soft and diffusely tender. No organomegaly or masses felt. Normal bowel sounds heard. Central nervous system: Alert and oriented. No focal neurological deficits. Extremities: No edema. No calf tenderness Skin: No cyanosis. No rashes Psychiatry: Judgement and insight appear normal. Blunt affect. Appears to have depressed mood.      Data Reviewed: I have personally reviewed following labs and imaging studies  CBC: Recent Labs  Lab 10/01/19 1644  WBC 9.5  NEUTROABS 4.4  HGB 13.1  HCT 42.0  MCV 92.9  PLT 0000000*   Basic Metabolic Panel: Recent Labs  Lab 10/01/19 1644  NA 144  K 3.7  CL 103  CO2 30  GLUCOSE 98  BUN 19  CREATININE 0.53  CALCIUM 9.2  MG 1.7   GFR: Estimated Creatinine Clearance: 42 mL/min (by C-G formula based on SCr of 0.53 mg/dL). Liver Function Tests: Recent Labs  Lab 10/01/19 1644  AST 26  ALT 26  ALKPHOS 85  BILITOT 0.7  PROT 6.8  ALBUMIN 3.8   Recent Labs  Lab 10/01/19 1644  LIPASE 21   No results for input(s): AMMONIA in the last 168 hours. Coagulation Profile: No results for input(s): INR, PROTIME in the last 168 hours. Cardiac Enzymes: No results for input(s): CKTOTAL, CKMB, CKMBINDEX, TROPONINI in the last 168  hours. BNP (last 3 results) No results for input(s): PROBNP in the last 8760 hours. HbA1C: No results for input(s): HGBA1C in the last 72 hours. CBG: No results for input(s): GLUCAP in the last 168 hours. Lipid Profile: No results for input(s): CHOL, HDL, LDLCALC, TRIG, CHOLHDL, LDLDIRECT in the last 72 hours. Thyroid Function Tests: No results for input(s): TSH, T4TOTAL, FREET4, T3FREE, THYROIDAB in the last 72 hours. Anemia Panel: No results for input(s): VITAMINB12, FOLATE, FERRITIN, TIBC, IRON, RETICCTPCT in the last 72 hours. Sepsis Labs: No results for input(s): PROCALCITON, LATICACIDVEN in the last 168 hours.  Recent Results (from the past 240 hour(s))  SARS CORONAVIRUS 2 (TAT 6-24 HRS) Nasopharyngeal Nasopharyngeal Swab     Status: None   Collection Time: 10/01/19  9:58 PM   Specimen: Nasopharyngeal Swab  Result Value Ref Range Status   SARS Coronavirus 2 NEGATIVE NEGATIVE Final    Comment: (NOTE) SARS-CoV-2 target nucleic acids are NOT DETECTED. The SARS-CoV-2 RNA is generally detectable in upper and lower respiratory specimens during the acute phase of infection. Negative results do not preclude SARS-CoV-2 infection, do not rule out co-infections with other pathogens, and should not be used as the sole basis for treatment or other patient management decisions. Negative results must be combined with clinical observations, patient history, and epidemiological information. The expected result is Negative. Fact Sheet for Patients: SugarRoll.be Fact Sheet for Healthcare Providers: https://www.woods-mathews.com/ This test is not yet approved or cleared by the [place] FDA and  has been authorized for detection and/or diagnosis of SARS-CoV-2 by FDA under an Emergency Use Authorization (EUA). This EUA will remain  in effect (meaning this test can be used) for the duration of the COVID-19 declaration under Section 56 4(b)(1) of the  Act, 21 U.S.C. section 360bbb-3(b)(1), unless the authorization is terminated or revoked sooner. Performed at [facility], [place] [address] [place], [place] [address]          Radiology Studies: DG Chest Portable 1 View  Result Date: 10/01/2019 CLINICAL DATA:  Shortness of breath. Esophageal pain. History of COPD, lung cancer, stomach cancer, partial pneumonectomy. EXAM: PORTABLE CHEST 1 VIEW COMPARISON:  09/10/2019 FINDINGS: Lungs are hyperinflated. No focal consolidations or pleural effusions. No pulmonary edema. Surgical clips overlie the LEFT hilar region. IMPRESSION: No evidence for acute abnormality.  Hyperinflation. Electronically Signed   By: Nolon Nations M.D.   On: 10/01/2019 16:59        Scheduled Meds: . enoxaparin (LOVENOX) injection  30 mg Subcutaneous Daily  . feeding supplement (ENSURE ENLIVE)  237 mL Oral QID  . metoCLOPramide (REGLAN) injection  5 mg Intravenous TID PC & HS  . pantoprazole (PROTONIX) IV  40 mg Intravenous Q12H  . sodium chloride flush  3 mL Intravenous Q12H  . sucralfate  1 g Oral TID WC & HS  . umeclidinium bromide  1 puff Inhalation Daily   Continuous Infusions: . sodium chloride 75 mL/hr at 10/03/19 0917  . fluconazole (DIFLUCAN) IV       LOS: 1 day     Cordelia Poche, MD [facility] 10/03/2019, 9:54 AM  If 7PM-7AM, please contact night-coverage [email or web address]

## 2019-10-03 NOTE — Progress Notes (Signed)
Brief Nutrition Note  Consult received for initiation of TPN/TNA. See RD note on 10/02/19 for full assessment.  Intervention: TPN/TNA per pharmacy  Estimated Needs: Kcal: 1450-1750 Protein: 73-88 grams Fluid: >/= 1.4 L/day    Nutrition Diagnosis: Inadequate oral intake related to altered GI function(dysphagia and recurrent emesis due to esophageal necrosis) as evidenced by per patient/family report, NPO status, percent weight loss.  Goal:  Patient will meet greater than or equal to 90% of their needs.  Monitor: Labs, Weight Trends, I &O's    Larkin Ina, MS, RD, LDN RD pager number and weekend/on-call pager number located in [place].

## 2019-10-03 NOTE — Progress Notes (Addendum)
Called by nursing patient requiring repeat dose of narcan after a 0.5mg  dose of dilaudid. Patient previously tolerated 1mg  q4 without issues. Patient additionally c/o increased esophageal pain. ABG and CT chest with oral contrast done.  Results for Claudia Wiley, Claudia Wiley (MRN [id number]) as of 10/03/2019 23:23  Ref. Range 10/03/2019 21:31  FIO2 Unknown NOT PROVIDED  pH, Arterial Latest Ref Range: 7.350 - 7.450  7.251 (L)  pCO2 arterial Latest Ref Range: 32.0 - 48.0 mmHg 63.5 (H)  pO2, Arterial Latest Ref Range: 83.0 - 108.0 mmHg 91.0  Acid-base deficit Latest Ref Range: 0.0 - 2.0 mmol/L 0.8  Bicarbonate Latest Ref Range: 20.0 - 28.0 mmol/L 27.0  O2 Saturation Latest Units: % 96.9  Patient temperature Unknown 98.6   EXAM: CT CHEST WITHOUT CONTRAST  TECHNIQUE: Multidetector CT imaging of the chest was performed following the standard protocol without IV contrast.  COMPARISON:  None.  FINDINGS: Cardiovascular: Heart size is normal. There is a trace pericardial effusion. There are atherosclerotic changes of the thoracic aorta without evidence for a thoracic aortic aneurysm. There are coronary artery calcifications.  Mediastinum/Nodes:  --No mediastinal or hilar lymphadenopathy.  --No axillary lymphadenopathy.  --No supraclavicular lymphadenopathy.  --Normal thyroid gland.  --there is mild diffuse wall thickening of the distal esophagus which is nonspecific.  Lungs/Pleura: Patient appears to be status post prior wedge resection involving the left upper lobe. There are moderate to severe emphysematous changes bilaterally. There is no pneumothorax. There is a small right-sided pleural effusion. There is a trace left-sided pleural effusion. There is mild scattered bronchial wall thickening and mucus plugging bilaterally.  Upper Abdomen: No acute abnormality.  Musculoskeletal: No chest wall abnormality. No acute or significant osseous findings.  IMPRESSION: 1. Mild  diffuse esophageal wall thickening which is nonspecific but may represent esophagitis. There is no evidence for extraluminal contrast or free air to suggest a perforation. 2. Small right-sided pleural effusion.  No pneumothorax. 3. Moderate emphysematous changes. There are postsurgical changes in the left upper lobe related to prior resection.  Aortic Atherosclerosis (ICD10-I70.0) and Emphysema (ICD10-J43.9).   Electronically Signed   By: Constance Holster M.D      Plan: patient transferred to stepdown on BiPaP. Toradol PRN pain. Repeat ABG in 90 minutes

## 2019-10-03 NOTE — Progress Notes (Signed)
Pt unable to follow commands, drowsiness, slow to respond, watery eyes. BP 114/63, HR 104. Notified Dr Lonny Prude.

## 2019-10-03 NOTE — Op Note (Signed)
[facility] Patient Name: Claudia Wiley Procedure Date: 10/03/2019 MRN: [id number] Attending MD: Jerene Bears , MD Date of Birth: 1956/02/20 CSN: [id number] Age: 64 Admit Type: Inpatient Procedure:                Upper GI endoscopy Indications:              Dysphagia, Odynophagia, history of acute esophageal                            necrosis seen at EGD 09/11/2019 Providers:                Lajuan Lines. Hilarie Fredrickson, MD, Vista Lawman, RN, Cherylynn Ridges,                            Technician Referring MD:             [facility] Medicines:                Monitored Anesthesia Care Complications:            No immediate complications. Estimated Blood Loss:     Estimated blood loss was minimal. Procedure:                Pre-Anesthesia Assessment:                           - Prior to the procedure, a History and Physical                            was performed, and patient medications and                            allergies were reviewed. The patient's tolerance of                            previous anesthesia was also reviewed. The risks                            and benefits of the procedure and the sedation                            options and risks were discussed with the patient.                            All questions were answered, and informed consent                            was obtained. Prior Anticoagulants: The patient has                            taken no previous anticoagulant or antiplatelet                            agents. ASA Grade Assessment: III - A patient with  severe systemic disease. After reviewing the risks                            and benefits, the patient was deemed in                            satisfactory condition to undergo the procedure.                           After obtaining informed consent, the endoscope was                            passed under direct vision. Throughout the          procedure, the patient's blood pressure, pulse, and                            oxygen saturations were monitored continuously. The                            [id number] Olympus gastroscope was                            introduced through the mouth, and advanced to the                            second part of duodenum. The [id number]                            Olympus ultra slim endoscope was introduced through                            the and advanced to the. The upper GI endoscopy was                            accomplished without difficulty. The patient                            tolerated the procedure well. Scope In: Scope Out: Findings:      LA Grade D (one or more mucosal breaks involving at least 75% of       esophageal circumference) esophagitis with contact friability/bleeding       was found 24 to 38 cm (GE junction) from the incisors. There is adherent       fibrinous material in the middle and distal esophagus along with areas       of ulceration. This likely represents repair/regeneration after recent       acute esophageal injury. There were scattered plagues consistent with       candidiasis. The lumen narrows to less than 9 mm in diameter at       approximately 28 cm from the incisors necessitating change to a       pediatric upper endoscope. The pediatric scope easily traverses the       entire esophagus without resistance. Biopsies were taken with a cold       forceps for histology from  the proximal, middle and distal esophagus.      A 3 cm hiatal hernia was found. The proximal extent of the gastric folds       (end of tubular esophagus) was 34 cm from the incisors. The hiatal       narrowing was 37 cm from the incisors.      The gastroesophageal flap valve was visualized endoscopically and       classified as Hill Grade IV (no fold, wide open lumen, hiatal hernia       present).      Retained fluid was found in the gastric body.      No  gross lesions were noted in the entire examined stomach, though a       portion of the gastric mucosa was obscured by fluid/food debris which       could not be completely cleared.      The examined duodenum was normal. Impression:               - Severe esophagitis with ulceration,                            repair/regeneration reaction with contact bleeding.                            Esophageal stenosis developing from recent                            esophageal injury (acute esophageal necrosis late                            Feb 2021). Biopsied. Candidiasis.                           - 3 cm hiatal hernia.                           - Retained gastric fluid suggesting component of                            gastroparesis.                           - No gross lesions in the stomach.                           - Normal examined duodenum. Moderate Sedation:      N/A Recommendation:           - Return patient to hospital ward for ongoing care.                           - Full liquid diet as tolerated.                           - TPN until we determine how she progresses and to                            determine if she can meet caloric goal with liquid  diet.                           - Continue present medications including BID IV PPI                            and sucralfate suspension 1 g TID-AC and HS.                           - All medications should be liquid or IV for now.                           - Metoclopramide 5-10 mg liquid of IV TID-AC and HS                            to promote gastric emptying and help avoid reflux.                           - Await pathology results.                           - Eventually she will need repeat EGD and very                            likely serial dilation once esophagitis has healed. Procedure Code(s):        --- Professional ---                           [phone], Esophagogastroduodenoscopy, flexible,                             transoral; with biopsy, single or multiple Diagnosis Code(s):        --- Professional ---                           K20.90, Esophagitis, unspecified without bleeding                           K44.9, Diaphragmatic hernia without obstruction or                            gangrene                           R13.10, Dysphagia, unspecified CPT copyright 2019 American Medical Association. All rights reserved. The codes documented in this report are preliminary and upon coder review may  be revised to meet current compliance requirements. Jerene Bears, MD 10/03/2019 8:24:08 AM This report has been signed electronically. Number of Addenda: 0

## 2019-10-03 NOTE — Progress Notes (Signed)
Received page at 4:32 PM with regard to patient's change in status. Patient drowsy, slow to respond and unable to follow commands. BP of 114/63 with HR of 104. Upon arrival to bedside, patient is arousable but not oriented. Patient asked to move extremities and states that she is, however on exam, there is no movement. Initial exam significant for decreased pupil size, no dysarthria or face droop, decreased muscle tone of arms/legs, 2+ reflexes, normal respirations, tachycardia. Code stroke called secondary to abnormal neuro exam. CBG checked and was 95. Prior to transfer to CT, narcan 0.4 mg given via IV with immediate response from patient. Patient subsequently in significant pain and crying out for help to control pain; moving all extremities spontaneously and with good tone pressent. Toradol 15 mg given via IV x1 with minimal improvement. Attempted to give 5 mg of oxycodone, but patient spit it up. Patient given another dilaudid 0.5 mg via IV. Patient resting comfortable in bed. No distress. Pulse oximetry connected and patient with SpO2 in 98-100% range. Plan discussed with daughter and husband at bedside. Code stroke canceled.    CRITICAL CARE Performed by: Cordelia Poche   Total critical care time: 45 minutes  Critical care time was exclusive of separately billable procedures and treating other patients.  Critical care was necessary to treat or prevent imminent or life-threatening deterioration.  Critical care was time spent personally by me on the following activities: development of treatment plan with patient and/or surrogate as well as nursing, discussions with consultants, evaluation of patient's response to treatment, examination of patient, obtaining history from patient or surrogate, ordering and performing treatments and interventions, ordering and review of laboratory studies, ordering and review of radiographic studies, pulse oximetry and re-evaluation of patient's condition.  Cordelia Poche, MD [facility] 10/03/2019, 5:25 PM

## 2019-10-03 NOTE — Transfer of Care (Signed)
Immediate Anesthesia Transfer of Care Note  Patient: Claudia Wiley  Procedure(s) Performed: ESOPHAGOGASTRODUODENOSCOPY (EGD) WITH PROPOFOL (N/A ) BIOPSY  Patient Location: Endoscopy Unit  Anesthesia Type:MAC  Level of Consciousness: drowsy and patient cooperative  Airway & Oxygen Therapy: Patient Spontanous Breathing and Patient connected to nasal cannula oxygen  Post-op Assessment: Report given to RN and Post -op Vital signs reviewed and stable  Post vital signs: Reviewed and stable  Last Vitals:  Vitals Value Taken Time  BP 110/49 10/03/19 0811  Temp    Pulse 100 10/03/19 0814  Resp 21 10/03/19 0814  SpO2 100 % 10/03/19 0814  Vitals shown include unvalidated device data.  Last Pain:  Vitals:   10/03/19 0705  TempSrc: Temporal  PainSc: 0-No pain      Patients Stated Pain Goal: 0 (57/49/35 5217)  Complications: No apparent anesthesia complications

## 2019-10-03 NOTE — Anesthesia Postprocedure Evaluation (Signed)
Anesthesia Post Note  Patient: Claudia Wiley  Procedure(s) Performed: ESOPHAGOGASTRODUODENOSCOPY (EGD) WITH PROPOFOL (N/A ) BIOPSY     Patient location during evaluation: Endoscopy Anesthesia Type: MAC Level of consciousness: awake and alert Pain management: pain level controlled Vital Signs Assessment: post-procedure vital signs reviewed and stable Respiratory status: spontaneous breathing, nonlabored ventilation, respiratory function stable and patient connected to nasal cannula oxygen Cardiovascular status: blood pressure returned to baseline and stable Postop Assessment: no apparent nausea or vomiting Anesthetic complications: no    Last Vitals:  Vitals:   10/03/19 0820 10/03/19 0830  BP: (!) 105/51 (!) 103/51  Pulse: 94 96  Resp: 12 18  Temp:    SpO2: 100% 100%    Last Pain:  Vitals:   10/03/19 0811  TempSrc: Temporal  PainSc: 0-No pain                 Lidia Collum

## 2019-10-03 NOTE — Progress Notes (Signed)
Pharmacy: Re- TPN  Unable to place PICC today (see IV team's note). Will d/c TPN order, change IVF rate back to 75 ml/hr, and d/c SSI.  Will start TPN when patient has PICC access.  Dia Sitter, PharmD, BCPS 10/03/2019 8:16 PM

## 2019-10-03 NOTE — Progress Notes (Signed)
Called RN to notify of arrival to place PICC line.  Hinton Dyer RN states Pt is currently in a Code Stroke. Hinton Dyer RN states Dr Teryl Lucy states to hold PICC placement.  Anh - pharmacy notified that PICC would not be placed today.

## 2019-10-03 NOTE — Progress Notes (Signed)
Rapid response called.  Pt lethargic and disoriented.  VS stable except pt tachycardic.  Dr. Dayna Barker called.  Narcan ordered . Pt had been given Dilaudid earlier.  Dr. Claria Dice in and orders placed for stat ABGs, stat CT of chest w/ oral contrast for esophageal leak.

## 2019-10-03 NOTE — Anesthesia Preprocedure Evaluation (Signed)
Anesthesia Evaluation  Patient identified by MRN, date of birth, ID band Patient awake    Reviewed: Allergy & Precautions, NPO status , Patient's Chart, lab work & pertinent test results  History of Anesthesia Complications Negative for: history of anesthetic complications  Airway Mallampati: II  TM Distance: >3 FB Neck ROM: Full    Dental  (+) Edentulous Upper, Edentulous Lower   Pulmonary COPD,  oxygen dependent, former smoker,    Pulmonary exam normal        Cardiovascular hypertension, Normal cardiovascular exam     Neuro/Psych negative neurological ROS  negative psych ROS   GI/Hepatic Neg liver ROS, GERD  ,Erosive candidal esophagitis  Gastrinoma s/p Whipple   Endo/Other  negative endocrine ROS  Renal/GU negative Renal ROS  negative genitourinary   Musculoskeletal negative musculoskeletal ROS (+)   Abdominal   Peds  Hematology negative hematology ROS (+)   Anesthesia Other Findings  Echo 09/16/19: EF 60-65%, normal RV, mild MR  Reproductive/Obstetrics                            Anesthesia Physical Anesthesia Plan  ASA: IV  Anesthesia Plan: MAC   Post-op Pain Management:    Induction: Intravenous  PONV Risk Score and Plan: 2 and Propofol infusion, TIVA and Treatment may vary due to age or medical condition  Airway Management Planned: Natural Airway, Nasal Cannula and Simple Face Mask  Additional Equipment: None  Intra-op Plan:   Post-operative Plan:   Informed Consent: I have reviewed the patients History and Physical, chart, labs and discussed the procedure including the risks, benefits and alternatives for the proposed anesthesia with the patient or authorized representative who has indicated his/her understanding and acceptance.       Plan Discussed with:   Anesthesia Plan Comments:         Anesthesia Quick Evaluation

## 2019-10-04 ENCOUNTER — Inpatient Hospital Stay: Payer: Self-pay

## 2019-10-04 DIAGNOSIS — J9602 Acute respiratory failure with hypercapnia: Secondary | ICD-10-CM

## 2019-10-04 DIAGNOSIS — B3781 Candidal esophagitis: Principal | ICD-10-CM

## 2019-10-04 DIAGNOSIS — K209 Esophagitis, unspecified without bleeding: Secondary | ICD-10-CM

## 2019-10-04 LAB — DIFFERENTIAL
Abs Immature Granulocytes: 0.09 10*3/uL — ABNORMAL HIGH (ref 0.00–0.07)
Basophils Absolute: 0 10*3/uL (ref 0.0–0.1)
Basophils Relative: 0 %
Eosinophils Absolute: 0 10*3/uL (ref 0.0–0.5)
Eosinophils Relative: 0 %
Immature Granulocytes: 1 %
Lymphocytes Relative: 17 %
Lymphs Abs: 2.7 10*3/uL (ref 0.7–4.0)
Monocytes Absolute: 2.4 10*3/uL — ABNORMAL HIGH (ref 0.1–1.0)
Monocytes Relative: 15 %
Neutro Abs: 10.9 10*3/uL — ABNORMAL HIGH (ref 1.7–7.7)
Neutrophils Relative %: 67 %

## 2019-10-04 LAB — BLOOD GAS, ARTERIAL
Acid-base deficit: 0.8 mmol/L (ref 0.0–2.0)
Bicarbonate: 25.4 mmol/L (ref 20.0–28.0)
FIO2: 30
O2 Saturation: 99.3 %
Patient temperature: 97.6
pCO2 arterial: 51.6 mmHg — ABNORMAL HIGH (ref 32.0–48.0)
pH, Arterial: 7.309 — ABNORMAL LOW (ref 7.350–7.450)
pO2, Arterial: 147 mmHg — ABNORMAL HIGH (ref 83.0–108.0)

## 2019-10-04 LAB — COMPREHENSIVE METABOLIC PANEL
ALT: 17 U/L (ref 0–44)
AST: 19 U/L (ref 15–41)
Albumin: 2.6 g/dL — ABNORMAL LOW (ref 3.5–5.0)
Alkaline Phosphatase: 68 U/L (ref 38–126)
Anion gap: 11 (ref 5–15)
BUN: <5 mg/dL — ABNORMAL LOW (ref 8–23)
CO2: 24 mmol/L (ref 22–32)
Calcium: 8.1 mg/dL — ABNORMAL LOW (ref 8.9–10.3)
Chloride: 107 mmol/L (ref 98–111)
Creatinine, Ser: 0.37 mg/dL — ABNORMAL LOW (ref 0.44–1.00)
GFR calc Af Amer: >60 mL/min (ref >60)
GFR calc non Af Amer: >60 mL/min (ref >60)
Glucose, Bld: 81 mg/dL (ref 70–99)
Potassium: 4.2 mmol/L (ref 3.5–5.1)
Sodium: 142 mmol/L (ref 135–145)
Total Bilirubin: 0.8 mg/dL (ref 0.3–1.2)
Total Protein: 5 g/dL — ABNORMAL LOW (ref 6.5–8.1)

## 2019-10-04 LAB — PREALBUMIN: Prealbumin: 8.5 mg/dL — ABNORMAL LOW (ref 18–38)

## 2019-10-04 LAB — GLUCOSE, CAPILLARY
Glucose-Capillary: 132 mg/dL — ABNORMAL HIGH (ref 70–99)
Glucose-Capillary: 82 mg/dL (ref 70–99)

## 2019-10-04 LAB — CBC
HCT: 35.5 % — ABNORMAL LOW (ref 36.0–46.0)
Hemoglobin: 10.2 g/dL — ABNORMAL LOW (ref 12.0–15.0)
MCH: 28.9 pg (ref 26.0–34.0)
MCHC: 28.7 g/dL — ABNORMAL LOW (ref 30.0–36.0)
MCV: 100.6 fL — ABNORMAL HIGH (ref 80.0–100.0)
Platelets: 284 10*3/uL (ref 150–400)
RBC: 3.53 MIL/uL — ABNORMAL LOW (ref 3.87–5.11)
RDW: 15.8 % — ABNORMAL HIGH (ref 11.5–15.5)
WBC: 16.1 10*3/uL — ABNORMAL HIGH (ref 4.0–10.5)
nRBC: 0 % (ref 0.0–0.2)

## 2019-10-04 LAB — MAGNESIUM: Magnesium: 1.8 mg/dL (ref 1.7–2.4)

## 2019-10-04 LAB — PHOSPHORUS: Phosphorus: 1.7 mg/dL — ABNORMAL LOW (ref 2.5–4.6)

## 2019-10-04 LAB — TRIGLYCERIDES: Triglycerides: 103 mg/dL (ref <150)

## 2019-10-04 MED ORDER — SODIUM CHLORIDE 0.9% FLUSH
10.0000 mL | Freq: Two times a day (BID) | INTRAVENOUS | Status: DC
  Administered 2019-10-04 – 2019-10-14 (×8): 10 mL
  Administered 2019-10-20: 20 mL

## 2019-10-04 MED ORDER — MORPHINE SULFATE (PF) 2 MG/ML IV SOLN: 1.0000 mg | INTRAVENOUS | Status: DC | PRN

## 2019-10-04 MED ORDER — ACETAMINOPHEN 650 MG RE SUPP
650.0000 mg | Freq: Four times a day (QID) | RECTAL | Status: DC | PRN
  Filled 2019-10-04: qty 1

## 2019-10-04 MED ORDER — LIP MEDEX EX OINT
TOPICAL_OINTMENT | CUTANEOUS | Status: AC
  Filled 2019-10-04: qty 7

## 2019-10-04 MED ORDER — INSULIN ASPART 100 UNIT/ML ~~LOC~~ SOLN
0.0000 [IU] | Freq: Four times a day (QID) | SUBCUTANEOUS | Status: DC
  Administered 2019-10-04: 1 [IU] via SUBCUTANEOUS
  Administered 2019-10-05: 2 [IU] via SUBCUTANEOUS
  Administered 2019-10-05: 1 [IU] via SUBCUTANEOUS
  Administered 2019-10-05: 2 [IU] via SUBCUTANEOUS
  Administered 2019-10-06 – 2019-10-08 (×5): 1 [IU] via SUBCUTANEOUS
  Administered 2019-10-10: 2 [IU] via SUBCUTANEOUS
  Administered 2019-10-10 – 2019-10-11 (×3): 1 [IU] via SUBCUTANEOUS
  Administered 2019-10-12 (×2): 0 [IU] via SUBCUTANEOUS

## 2019-10-04 MED ORDER — TRAVASOL 10 % IV SOLN
INTRAVENOUS | Status: AC
  Filled 2019-10-04: qty 403.2

## 2019-10-04 MED ORDER — ORAL CARE MOUTH RINSE
15.0000 mL | Freq: Two times a day (BID) | OROMUCOSAL | Status: DC
  Administered 2019-10-04 – 2019-10-21 (×31): 15 mL via OROMUCOSAL

## 2019-10-04 MED ORDER — CHLORHEXIDINE GLUCONATE CLOTH 2 % EX PADS
6.0000 | MEDICATED_PAD | Freq: Every day | CUTANEOUS | Status: DC
  Administered 2019-10-04 – 2019-10-21 (×16): 6 via TOPICAL

## 2019-10-04 MED ORDER — LORAZEPAM 2 MG/ML IJ SOLN
0.5000 mg | INTRAMUSCULAR | Status: DC | PRN
  Administered 2019-10-04 – 2019-10-12 (×7): 0.5 mg via INTRAVENOUS
  Filled 2019-10-04 (×7): qty 1

## 2019-10-04 MED ORDER — MAGNESIUM SULFATE IN D5W 1-5 GM/100ML-% IV SOLN
1.0000 g | Freq: Once | INTRAVENOUS | Status: AC
  Administered 2019-10-04: 08:00:00 1 g via INTRAVENOUS
  Filled 2019-10-04: qty 100

## 2019-10-04 MED ORDER — SODIUM PHOSPHATES 45 MMOLE/15ML IV SOLN
10.0000 mmol | Freq: Once | INTRAVENOUS | Status: AC
  Administered 2019-10-04: 11:00:00 10 mmol via INTRAVENOUS
  Filled 2019-10-04: qty 3.33

## 2019-10-04 MED ORDER — OXYCODONE HCL 20 MG/ML PO CONC
5.0000 mg | ORAL | Status: DC | PRN
  Administered 2019-10-05 – 2019-10-21 (×34): 5 mg via ORAL
  Filled 2019-10-04 (×34): qty 1

## 2019-10-04 MED ORDER — SODIUM CHLORIDE 0.9 % IV SOLN: INTRAVENOUS | Status: DC

## 2019-10-04 MED ORDER — SODIUM CHLORIDE 0.9% FLUSH
10.0000 mL | INTRAVENOUS | Status: DC | PRN
  Administered 2019-10-13 – 2019-10-14 (×2): 10 mL

## 2019-10-04 MED ORDER — ACETAMINOPHEN 160 MG/5ML PO SOLN
650.0000 mg | Freq: Four times a day (QID) | ORAL | Status: DC | PRN
  Administered 2019-10-05 – 2019-10-19 (×18): 650 mg via ORAL
  Filled 2019-10-04 (×19): qty 20.3

## 2019-10-04 MED ORDER — OXYCODONE HCL 5 MG/5ML PO SOLN: 5.0000 mg | ORAL | Status: DC | PRN

## 2019-10-04 NOTE — Progress Notes (Addendum)
PROGRESS NOTE    Claudia Wiley  [id number] DOB: [birth year] DOA: 10/01/2019 PCP: [facility]   Brief Narrative: Claudia Wiley is a 64 y.o. female with a history COPD/chronic respiratory failure with hypoxia on 2 L supplemental O2 via [place], history of lung cancer s/p LUL lobectomy, gastrinoma s/p Whipple's procedure, anxiety/depression, esophageal stricture and dilatation July 2019, hypertension, and recent admission for severe Candida esophagitis with esophageal necrosis. She presented secondary to dehydration secondary to recurrent emesis and lack of intake secondary to odynophagia.   Assessment & Plan:   Principal Problem:   Orthostasis Active Problems:   Essential hypertension   Pancreatic insufficiency   COPD (chronic obstructive pulmonary disease) (HCC)   Candida esophagitis (HCC)   Candida esophagitis Odynophagia Duodenal ulcers Patient previous treated with full course of fluconazole. Concern for possible reoccurrence. Patient is very symptomatic with severe pain with swallowing. GI consulted and performed EGD on 3/21. -GI recommendations: liquids only by mouth; alternate mode of nutrition to allow healing of upper GI tract -Continue Fluconazole IV -Morphine IV prn -Full liquid as tolerated -Dietitian consult -Pharmacy consult for TPN (PICC line to be placed today)  Acute respiratory failure with hypercapnia Patient with a pH of 7.251 initially and pCO2 of 63.5. Patient placed on bipap with improved pH of 7.309 and pCO2 of 51.6 after 90 minutes. Patient is baseline today -Discontinue bipap -Watch in stepdown for 24 hours and transfer to floor if remains stable  Opioid poisoning Iatrogenic. Patient was tolerating narcotic regimen but acutely worsened on 3/21 with increased lethargy. Dilaudid discontinued. Toradol used prn. -Discontinue Toradol secondary to above issues -Hold analgesics for now  Orthostasis Secondary to dehydration. Will manage with IV  fluids  Dehydration Secondary to inability to maintain oral intake -IV fluids  COPD Chronic respiratory failure with hypoxia -Continue 2L O2 via [place]   DVT prophylaxis: Lovenox Code Status:   Code Status: Full Code Family Communication: None at bedside. Daughter on telephone Disposition Plan: Discharge pending improvement of odynophagia, transition back to oral diet   Consultants:   [facility]  Procedures:   None  Antimicrobials:  Fluconazole    Subjective: No issues. No pain currently.  Objective: Vitals:   10/04/19 0310 10/04/19 0400 10/04/19 0730 10/04/19 0800  BP:  (!) 111/57 (!) 104/52   Pulse:  93 81   Resp:  16 15   Temp: 99 F (37.2 C)   99 F (37.2 C)  TempSrc: Axillary   Axillary  SpO2:  100% 100%   Weight:  36.2 kg    Height:        Intake/Output Summary (Last 24 hours) at 10/04/2019 1126 Last data filed at 10/04/2019 0700 Gross per 24 hour  Intake 939.5 ml  Output --  Net 939.5 ml   Filed Weights   10/03/19 0423 10/03/19 1100 10/04/19 0400  Weight: 36 kg 36.2 kg 36.2 kg    Examination:  General exam: Appears calm and comfortable Respiratory system: Clear to auscultation. Respiratory effort normal. Cardiovascular system: S1 & S2 heard, RRR. No murmurs, rubs, gallops or clicks. Gastrointestinal system: Abdomen is nondistended, soft and nontender. No organomegaly or masses felt. Normal bowel sounds heard. Central nervous system: Alert and oriented. No focal neurological deficits. Extremities: No edema. No calf tenderness Skin: No cyanosis. No rashes Psychiatry: Judgement and insight appear normal. Mood & affect appropriate.    Data Reviewed: I have personally reviewed following labs and imaging studies  CBC: Recent Labs  Lab 10/01/19 1644 10/04/19 0455  WBC 9.5 16.1*  NEUTROABS 4.4 10.9*  HGB 13.1 10.2*  HCT 42.0 35.5*  MCV 92.9 100.6*  PLT 528* [id number]   Basic Metabolic Panel: Recent Labs  Lab 10/01/19 1644 10/03/19 1037  10/03/19 1305 10/04/19 0455  NA 144  --  143 142  K 3.7  --  4.1 4.2  CL 103  --  107 107  CO2 30  --  27 24  GLUCOSE 98  --  107* 81  BUN 19  --  8 <5*  CREATININE 0.53  --  0.44 0.37*  CALCIUM 9.2  --  8.3* 8.1*  MG 1.7  --  1.4* 1.8  PHOS  --  2.8  --  1.7*   GFR: Estimated Creatinine Clearance: 42.2 mL/min (A) (by C-G formula based on SCr of 0.37 mg/dL (L)). Liver Function Tests: Recent Labs  Lab 10/01/19 1644 10/03/19 1305 10/04/19 0455  AST 26 21 19   ALT 26 21 17   ALKPHOS 85 68 68  BILITOT 0.7 0.7 0.8  PROT 6.8 5.2* 5.0*  ALBUMIN 3.8 2.8* 2.6*   Recent Labs  Lab 10/01/19 1644  LIPASE 21   No results for input(s): AMMONIA in the last 168 hours. Coagulation Profile: No results for input(s): INR, PROTIME in the last 168 hours. Cardiac Enzymes: No results for input(s): CKTOTAL, CKMB, CKMBINDEX, TROPONINI in the last 168 hours. BNP (last 3 results) No results for input(s): PROBNP in the last 8760 hours. HbA1C: No results for input(s): HGBA1C in the last 72 hours. CBG: Recent Labs  Lab 10/03/19 1644 10/03/19 2058 10/04/19 0014  GLUCAP 95 88 82   Lipid Profile: Recent Labs    10/04/19 0455  TRIG 103   Thyroid Function Tests: No results for input(s): TSH, T4TOTAL, FREET4, T3FREE, THYROIDAB in the last 72 hours. Anemia Panel: No results for input(s): VITAMINB12, FOLATE, FERRITIN, TIBC, IRON, RETICCTPCT in the last 72 hours. Sepsis Labs: No results for input(s): PROCALCITON, LATICACIDVEN in the last 168 hours.  Recent Results (from the past 240 hour(s))  SARS CORONAVIRUS 2 (TAT 6-24 HRS) Nasopharyngeal Nasopharyngeal Swab     Status: None   Collection Time: 10/01/19  9:58 PM   Specimen: Nasopharyngeal Swab  Result Value Ref Range Status   SARS Coronavirus 2 NEGATIVE NEGATIVE Final    Comment: (NOTE) SARS-CoV-2 target nucleic acids are NOT DETECTED. The SARS-CoV-2 RNA is generally detectable in upper and lower respiratory specimens during the acute  phase of infection. Negative results do not preclude SARS-CoV-2 infection, do not rule out co-infections with other pathogens, and should not be used as the sole basis for treatment or other patient management decisions. Negative results must be combined with clinical observations, patient history, and epidemiological information. The expected result is Negative. Fact Sheet for Patients: SugarRoll.be Fact Sheet for Healthcare Providers: https://www.woods-mathews.com/ This test is not yet approved or cleared by the [place] FDA and  has been authorized for detection and/or diagnosis of SARS-CoV-2 by FDA under an Emergency Use Authorization (EUA). This EUA will remain  in effect (meaning this test can be used) for the duration of the COVID-19 declaration under Section 56 4(b)(1) of the Act, 21 U.S.C. section 360bbb-3(b)(1), unless the authorization is terminated or revoked sooner. Performed at [facility], [place] [address] [place], [place] [address]          Radiology Studies: CT CHEST WO CONTRAST  Result Date: 10/03/2019 CLINICAL DATA:  Pain status post EGD. EXAM: CT CHEST WITHOUT CONTRAST TECHNIQUE: Multidetector CT  imaging of the chest was performed following the standard protocol without IV contrast. COMPARISON:  None. FINDINGS: Cardiovascular: Heart size is normal. There is a trace pericardial effusion. There are atherosclerotic changes of the thoracic aorta without evidence for a thoracic aortic aneurysm. There are coronary artery calcifications. Mediastinum/Nodes: --No mediastinal or hilar lymphadenopathy. --No axillary lymphadenopathy. --No supraclavicular lymphadenopathy. --Normal thyroid gland. --there is mild diffuse wall thickening of the distal esophagus which is nonspecific. Lungs/Pleura: Patient appears to be status post prior wedge resection involving the left upper lobe. There are moderate to severe emphysematous changes  bilaterally. There is no pneumothorax. There is a small right-sided pleural effusion. There is a trace left-sided pleural effusion. There is mild scattered bronchial wall thickening and mucus plugging bilaterally. Upper Abdomen: No acute abnormality. Musculoskeletal: No chest wall abnormality. No acute or significant osseous findings. IMPRESSION: 1. Mild diffuse esophageal wall thickening which is nonspecific but may represent esophagitis. There is no evidence for extraluminal contrast or free air to suggest a perforation. 2. Small right-sided pleural effusion.  No pneumothorax. 3. Moderate emphysematous changes. There are postsurgical changes in the left upper lobe related to prior resection. Aortic Atherosclerosis (ICD10-I70.0) and Emphysema (ICD10-J43.9). Electronically Signed   By: Constance Holster M.D.   On: 10/03/2019 22:48   [place] EKG Site Rite  Result Date: 10/04/2019 If [facility] image not attached, placement could not be confirmed due to current cardiac rhythm.       Scheduled Meds: . Chlorhexidine Gluconate Cloth  6 each Topical Daily  . enoxaparin (LOVENOX) injection  30 mg Subcutaneous Daily  . feeding supplement (ENSURE ENLIVE)  237 mL Oral QID  . [START ON 10/05/2019] insulin aspart  0-9 Units Subcutaneous Q6H  . mouth rinse  15 mL Mouth Rinse BID  . metoCLOPramide (REGLAN) injection  5 mg Intravenous TID PC & HS  . pantoprazole (PROTONIX) IV  40 mg Intravenous Q12H  . sodium chloride flush  10-40 mL Intracatheter Q12H  . sodium chloride flush  3 mL Intravenous Q12H  . sucralfate  1 g Oral TID WC & HS  . umeclidinium bromide  1 puff Inhalation Daily   Continuous Infusions: . sodium chloride 75 mL/hr at 10/03/19 2037  . sodium chloride    . fluconazole (DIFLUCAN) IV 200 mg (10/03/19 2218)  . sodium phosphate  Dextrose 5% IVPB 10 mmol (10/04/19 1033)  . TPN ADULT (ION)       LOS: 2 days     Cordelia Poche, MD [facility] 10/04/2019, 11:26 AM  If 7PM-7AM, please  contact night-coverage [email or web address]

## 2019-10-04 NOTE — Progress Notes (Signed)
Progress Note    ASSESSMENT AND PLAN:    64 yo female with COPD, hx of gastrinoma s/p Whipple in 2015, LUL lobectomy, GERD / reported stricture / recent acute esophageal necrosis / bacterial / fungal infection ( earlier this month).    # Recurrent dysphagia / odynophagia resulting in dehydration --EGD yesterday >>> severe esophagitis with ulceration, repair / regeneration / candidiasis / lumen narrowing, 3 cm hh. Esophageal biopsies pending --Continue IV fluconazole pending biopsies results --Continue Carafate suspension ( she is taking it) --Continue BID IV PPI --Continue IV Reglan to promote gastric emptying in effort to avoid reflux --To start TNA --Has full liquid diet ordered. Bottle of Ensure open on bedside table but she hasn't even tried it  --Eventual repeat EGD with intentions to dilate     SUBJECTIVE   Transferred to Stepdown on BiPap last night after she required repeat dose of narcan after getting Dilaudid. Overall she feels better than yesterday but still hurts to even swallow saliva.     OBJECTIVE:     Vital signs in last 24 hours: Temp:  [97.6 F (36.4 C)-99 F (37.2 C)] 99 F (37.2 C) (03/22 0800) Pulse Rate:  [81-117] 81 (03/22 0730) Resp:  [12-28] 15 (03/22 0730) BP: (102-156)/(52-82) 104/52 (03/22 0730) SpO2:  [94 %-100 %] 100 % (03/22 0730) FiO2 (%):  [30 %] 30 % (03/22 0400) Weight:  [36.2 kg] 36.2 kg (03/22 0400) Last BM Date: (unknown) General:   Alert, thin female in NAD EENT:  Normal hearing, non icteric sclera   Heart:  Regular rate and rhythm;  No lower extremity edema   Pulm: Normal respiratory effort   Abdomen:  Soft, nondistended, nontender.  Normal bowel sounds.          Neurologic:  Alert and  oriented x4;  grossly normal neurologically. Psych: cooperative.   Appears sad Intake/Output from previous day: 03/21 0701 - 03/22 0700 In: 1585.8 [I.V.:1585.8] Out: 300 [Urine:300] Intake/Output this shift: No intake/output data  recorded.  Lab Results: Recent Labs    10/01/19 1644 10/04/19 0455  WBC 9.5 16.1*  HGB 13.1 10.2*  HCT 42.0 35.5*  PLT 528* 284   BMET Recent Labs    10/01/19 1644 10/03/19 1305 10/04/19 0455  NA 144 143 142  K 3.7 4.1 4.2  CL 103 107 107  CO2 30 27 24   GLUCOSE 98 107* 81  BUN 19 8 <5*  CREATININE 0.53 0.44 0.37*  CALCIUM 9.2 8.3* 8.1*   LFT Recent Labs    10/04/19 0455  PROT 5.0*  ALBUMIN 2.6*  AST 19  ALT 17  ALKPHOS 68  BILITOT 0.8   PT/INR No results for input(s): LABPROT, INR in the last 72 hours. Hepatitis Panel No results for input(s): HEPBSAG, HCVAB, HEPAIGM, HEPBIGM in the last 72 hours.  CT CHEST WO CONTRAST  Result Date: 10/03/2019 CLINICAL DATA:  Pain status post EGD. EXAM: CT CHEST WITHOUT CONTRAST TECHNIQUE: Multidetector CT imaging of the chest was performed following the standard protocol without IV contrast. COMPARISON:  None. FINDINGS: Cardiovascular: Heart size is normal. There is a trace pericardial effusion. There are atherosclerotic changes of the thoracic aorta without evidence for a thoracic aortic aneurysm. There are coronary artery calcifications. Mediastinum/Nodes: --No mediastinal or hilar lymphadenopathy. --No axillary lymphadenopathy. --No supraclavicular lymphadenopathy. --Normal thyroid gland. --there is mild diffuse wall thickening of the distal esophagus which is nonspecific. Lungs/Pleura: Patient appears to be status post prior wedge resection involving the left upper lobe.  There are moderate to severe emphysematous changes bilaterally. There is no pneumothorax. There is a small right-sided pleural effusion. There is a trace left-sided pleural effusion. There is mild scattered bronchial wall thickening and mucus plugging bilaterally. Upper Abdomen: No acute abnormality. Musculoskeletal: No chest wall abnormality. No acute or significant osseous findings. IMPRESSION: 1. Mild diffuse esophageal wall thickening which is nonspecific but may  represent esophagitis. There is no evidence for extraluminal contrast or free air to suggest a perforation. 2. Small right-sided pleural effusion.  No pneumothorax. 3. Moderate emphysematous changes. There are postsurgical changes in the left upper lobe related to prior resection. Aortic Atherosclerosis (ICD10-I70.0) and Emphysema (ICD10-J43.9). Electronically Signed   By: Constance Holster M.D.   On: 10/03/2019 22:48   [place] EKG Site Rite  Result Date: 10/04/2019 If [facility] image not attached, placement could not be confirmed due to current cardiac rhythm.   Principal Problem:   Orthostasis Active Problems:   Essential hypertension   Pancreatic insufficiency   COPD (chronic obstructive pulmonary disease) (HCC)   Candida esophagitis ([place])     LOS: 2 days   Tye Savoy ,NP 10/04/2019, 10:37 AM

## 2019-10-04 NOTE — Progress Notes (Signed)
Peripherally Inserted Central Catheter Placement  The IV Nurse has discussed with the patient and/or persons authorized to consent for the patient, the purpose of this procedure and the potential benefits and risks involved with this procedure.  The benefits include less needle sticks, lab draws from the catheter, and the patient may be discharged home with the catheter. Risks include, but not limited to, infection, bleeding, blood clot (thrombus formation), and puncture of an artery; nerve damage and irregular heartbeat and possibility to perform a PICC exchange if needed/ordered by physician.  Alternatives to this procedure were also discussed.  Bard Power PICC patient education guide, fact sheet on infection prevention and patient information card has been provided to patient /or left at bedside.    PICC Placement Documentation  PICC Double Lumen 0000000 PICC Right Basilic 36 cm 0 cm (Active)  Indication for Insertion or Continuance of Line Poor Vasculature-patient has had multiple peripheral attempts or PIVs lasting less than 24 hours 10/04/19 0950  Exposed Catheter (cm) 0 cm 10/04/19 0950  Site Assessment Clean;Dry;Intact 10/04/19 0950  Lumen #1 Status Flushed;Blood return noted;Saline locked 10/04/19 0950  Lumen #2 Status Flushed;Blood return noted;Saline locked 10/04/19 0950  Dressing Type Transparent 10/04/19 0950  Dressing Status Clean;Dry;Intact;Antimicrobial disc in place 10/04/19 0950  Dressing Change Due 10/11/19 10/04/19 0950       Scotty Court 10/04/2019, 9:52 AM

## 2019-10-04 NOTE — Progress Notes (Signed)
Pt transferred to room 1222, pt's daughter Claudia Wiley called and updated about patients transfer.

## 2019-10-04 NOTE — Progress Notes (Signed)
Pt currently on 2 LPM [place] and tolerating well, Pt in no noted distress.  BIPAP not indicated at this time, RT to monitor and assess as needed.

## 2019-10-04 NOTE — Progress Notes (Signed)
RN removed PT from BiPAP at approximately 1030 and placed on 2 LPM [place]. When this RT observed PT at this time- PT does not appear to be in respiratory distress and resting well on 2 lpm (Sp02 98%, rr18, 124/65, BBS diminished- clear).

## 2019-10-05 ENCOUNTER — Encounter: Payer: Self-pay | Admitting: *Deleted

## 2019-10-05 ENCOUNTER — Inpatient Hospital Stay (HOSPITAL_COMMUNITY): Payer: No Typology Code available for payment source

## 2019-10-05 DIAGNOSIS — K222 Esophageal obstruction: Secondary | ICD-10-CM

## 2019-10-05 DIAGNOSIS — E43 Unspecified severe protein-calorie malnutrition: Secondary | ICD-10-CM

## 2019-10-05 LAB — GLUCOSE, CAPILLARY
Glucose-Capillary: 135 mg/dL — ABNORMAL HIGH (ref 70–99)
Glucose-Capillary: 155 mg/dL — ABNORMAL HIGH (ref 70–99)
Glucose-Capillary: 157 mg/dL — ABNORMAL HIGH (ref 70–99)

## 2019-10-05 LAB — PHOSPHORUS
Phosphorus: 1.3 mg/dL — ABNORMAL LOW (ref 2.5–4.6)
Phosphorus: 2.3 mg/dL — ABNORMAL LOW (ref 2.5–4.6)

## 2019-10-05 LAB — MAGNESIUM
Magnesium: 1.6 mg/dL — ABNORMAL LOW (ref 1.7–2.4)
Magnesium: 1.7 mg/dL (ref 1.7–2.4)

## 2019-10-05 LAB — BASIC METABOLIC PANEL
Anion gap: 6 (ref 5–15)
Anion gap: 5 (ref 5–15)
BUN: <5 mg/dL — ABNORMAL LOW (ref 8–23)
BUN: <5 mg/dL — ABNORMAL LOW (ref 8–23)
CO2: 33 mmol/L — ABNORMAL HIGH (ref 22–32)
CO2: 34 mmol/L — ABNORMAL HIGH (ref 22–32)
Calcium: 7.7 mg/dL — ABNORMAL LOW (ref 8.9–10.3)
Calcium: 7.5 mg/dL — ABNORMAL LOW (ref 8.9–10.3)
Chloride: 106 mmol/L (ref 98–111)
Chloride: 104 mmol/L (ref 98–111)
Creatinine, Ser: <0.3 mg/dL — ABNORMAL LOW (ref 0.44–1.00)
Creatinine, Ser: <0.3 mg/dL — ABNORMAL LOW (ref 0.44–1.00)
Glucose, Bld: 135 mg/dL — ABNORMAL HIGH (ref 70–99)
Glucose, Bld: 169 mg/dL — ABNORMAL HIGH (ref 70–99)
Potassium: 2.7 mmol/L — CL (ref 3.5–5.1)
Potassium: 3 mmol/L — ABNORMAL LOW (ref 3.5–5.1)
Sodium: 145 mmol/L (ref 135–145)
Sodium: 143 mmol/L (ref 135–145)

## 2019-10-05 MED ORDER — POTASSIUM PHOSPHATES 15 MMOLE/5ML IV SOLN
30.0000 mmol | Freq: Once | INTRAVENOUS | Status: AC
  Administered 2019-10-05: 09:00:00 30 mmol via INTRAVENOUS
  Filled 2019-10-05: qty 10

## 2019-10-05 MED ORDER — TRAVASOL 10 % IV SOLN
INTRAVENOUS | Status: AC
  Filled 2019-10-05: qty 403.2

## 2019-10-05 MED ORDER — POTASSIUM CHLORIDE 10 MEQ/50ML IV SOLN
10.0000 meq | INTRAVENOUS | Status: AC
  Administered 2019-10-05 – 2019-10-06 (×3): 10 meq via INTRAVENOUS
  Filled 2019-10-05 (×3): qty 50

## 2019-10-05 MED ORDER — POTASSIUM CHLORIDE 10 MEQ/100ML IV SOLN: 10.0000 meq | INTRAVENOUS | Status: DC

## 2019-10-05 MED ORDER — MOMETASONE FURO-FORMOTEROL FUM 200-5 MCG/ACT IN AERO
2.0000 | INHALATION_SPRAY | Freq: Two times a day (BID) | RESPIRATORY_TRACT | Status: DC
  Administered 2019-10-05 – 2019-10-21 (×31): 2 via RESPIRATORY_TRACT
  Filled 2019-10-05: qty 8.8

## 2019-10-05 MED ORDER — POTASSIUM CHLORIDE 10 MEQ/50ML IV SOLN
10.0000 meq | INTRAVENOUS | Status: DC
  Administered 2019-10-05: 07:00:00 10 meq via INTRAVENOUS
  Filled 2019-10-05 (×4): qty 50

## 2019-10-05 MED ORDER — MAGNESIUM SULFATE 2 GM/50ML IV SOLN
2.0000 g | Freq: Once | INTRAVENOUS | Status: AC
  Administered 2019-10-05: 05:00:00 2 g via INTRAVENOUS
  Filled 2019-10-05: qty 50

## 2019-10-05 MED ORDER — POTASSIUM CHLORIDE 10 MEQ/50ML IV SOLN
10.0000 meq | Freq: Once | INTRAVENOUS | Status: AC
  Administered 2019-10-05: 10 meq via INTRAVENOUS

## 2019-10-05 MED ORDER — POTASSIUM PHOSPHATES 15 MMOLE/5ML IV SOLN
15.0000 mmol | Freq: Once | INTRAVENOUS | Status: AC
  Administered 2019-10-05: 15 mmol via INTRAVENOUS
  Filled 2019-10-05: qty 5

## 2019-10-05 MED ORDER — POTASSIUM CHLORIDE CRYS ER 20 MEQ PO TBCR: 30.0000 meq | EXTENDED_RELEASE_TABLET | ORAL | Status: DC

## 2019-10-05 MED ORDER — MAGNESIUM SULFATE 2 GM/50ML IV SOLN
2.0000 g | Freq: Once | INTRAVENOUS | Status: AC
  Administered 2019-10-05: 2 g via INTRAVENOUS
  Filled 2019-10-05: qty 50

## 2019-10-05 NOTE — Progress Notes (Signed)
PHARMACY - TOTAL PARENTERAL NUTRITION CONSULT NOTE   Indication: Intolerance to enteral feeding  Patient Measurements: Height: 4' 11.02" (149.9 cm) Weight: 85 lb 15.7 oz (39 kg) IBW/kg (Calculated) : 43.24 TPN AdjBW (KG): 34.4 Body mass index is 17.36 kg/m. Usual Weight:   Assessment: [name] with hx esophageal stricture (s/p dilation in July 2019) who was hospitalized from 2/25-3/7 for severe esophagitis and odynophagia as well as duodenal ulcers.  She presented back to the ED on 3/19 with c/o SOB and esophageal pain. EGD on 3/21 showed severe esophagitis with ulceration and esophageal stenosis. GI team recommends to avoid "NG tube placement given recent esophageal injury" and  start TPN for nutrition.  Glucose / Insulin: no hx DM - CBGs 132 & 135 on TPN at 30 ml/hr, 2 U SSI given Electrolytes: phos 1.3 after 10 mM Na phos , K 2.7; Mag 1.6 after 1 gm bolus yesterday, CoCa 8.82 Renal: SCr WNL LFTs / TGs: LFTs wnl, Trig 103 Prealbumin / albumin:  - albumin low 2.6, prealbumin low 8.5 Intake / Output; MIVF:  - NS at 74 ml/hr GI Imaging: Surgeries / Procedures:  - 3/21 EGD: LA grade D esophagitis with contact bleeding from incisors. Scattered plaques consistent with candidiasis  Central access:  PICC placed 3/22 for TPN TPN start date: 3/22   Nutritional Goals (per RD recommendation on 3/20): Kcal:  1450-1750 Protein:  73-88 Fluid:  >/= 1.4 L/day  Goal TPN rate is 60 mL/hr (provides 81 g of protein and 1636 kcals per day)  Current Nutrition:  - ensure QID (2 charted as given 3/22) TPN  Plan:  Now: Magnesium sulfate 2gm IV per MD x1 and 30 mM KPhos(44 meq K) + 2 runs IV K  At 1800:  - continue TPN at 30 mL/hr at 1800-will not advance due to refeeding - Electrolytes in TPN: 28mEq/L of Na, 88mEq/L of K, 86mEq/L of Ca, 47mEq/L of Mg, and 23mmol/L of Phos. Cl:Ac ratio 1:1 - Add trace elements to TPN; MVI on MWF only due to national backorder - continue Sensitive  q6h SSI and adjust as needed  - continue MIVF at 45 mL/hr or per MD - Monitor TPN labs on Mon/Thurs, BMET, Mg and Phos in AM  Eudelia Bunch, Pharm.D [phone] 10/05/2019 7:18 AM

## 2019-10-05 NOTE — TOC Initial Note (Signed)
Transition of Care [facility]) - Initial/Assessment Note    Patient Details  Name: Claudia Wiley MRN: [id number] Date of Birth: 1956/04/13  Transition [facility]) CM/SW Contact:    Lennart Pall, LCSW Phone Number: 10/05/2019, 11:33 AM  Clinical Narrative:      Have met with pt and spoken with daughter to review d/c plans.  Pt appears to be improving medically and may be transferring to another unit today.  Plan will be for pt to d/c home with daughter, Claudia Wiley. [facility] had been referred at recent/ prior hospital d/c, however, daughter notes they had not yet started services.  Will refer to [facility] again once d/c known.  TOC to follow.          Expected Discharge Plan: [place] Barriers to Discharge: Continued Medical Work up   Patient Goals and CMS Choice Patient states their goals for this hospitalization and ongoing recovery are:: to return home      Expected Discharge Plan and Services Expected Discharge Plan: [place] In-house Referral: Clinical Social Work     Living arrangements for the past 2 months: Single Family Home                                      Prior Living Arrangements/Services Living arrangements for the past 2 months: Single Family Home Lives with:: Adult Children Patient language and need for interpreter reviewed:: Yes Do you feel safe going back to the place where you live?: Yes      Need for Family Participation in Patient Care: No (Comment) Care giver support system in place?: Yes (comment) Current home services: DME Criminal Activity/Legal Involvement Pertinent to Current Situation/Hospitalization: No - Comment as needed  Activities of Daily Living Home Assistive Devices/Equipment: Oxygen, Walker (specify type) ADL Screening (condition at time of admission) Patient's cognitive ability adequate to safely complete daily activities?: Yes Is the patient deaf or have difficulty hearing?: No Does the patient  have difficulty seeing, even when wearing glasses/contacts?: No Does the patient have difficulty concentrating, remembering, or making decisions?: No Patient able to express need for assistance with ADLs?: Yes Does the patient have difficulty dressing or bathing?: Yes Independently performs ADLs?: No Communication: Independent Dressing (OT): Needs assistance Is this a change from baseline?: Pre-admission baseline Grooming: Needs assistance Is this a change from baseline?: Pre-admission baseline Feeding: Independent Bathing: Needs assistance Is this a change from baseline?: Pre-admission baseline Toileting: Needs assistance Is this a change from baseline?: Pre-admission baseline In/Out Bed: Needs assistance Is this a change from baseline?: Pre-admission baseline Walks in Home: Needs assistance Is this a change from baseline?: Pre-admission baseline Does the patient have difficulty walking or climbing stairs?: Yes Weakness of Legs: Both Weakness of Arms/Hands: Both  Permission Sought/Granted Permission sought to share information with : Family Supports, Case Manager Permission granted to share information with : Yes, Verbal Permission Granted  Share Information with NAME: Sallyanne Kuster     Permission granted to share info w Relationship: daughter  Permission granted to share info w Contact Information: [phone]  Emotional Assessment Appearance:: Appears stated age Attitude/Demeanor/Rapport: Gracious Affect (typically observed): Accepting Orientation: : Oriented to Self, Oriented to Place, Oriented to  Time, Oriented to Situation Alcohol / Substance Use: Not Applicable Psych Involvement: No (comment)  Admission diagnosis:  Dehydration [E86.0] Orthostasis [I95.1] Esophagitis [K20.90] Patient Active Problem List   Diagnosis  Date Noted  . Orthostasis 10/01/2019  . Candida esophagitis ([place]) 10/01/2019  . Odynophagia   . Leukocytosis   . Esophagitis   . Esophageal  dysphagia   . Esophageal stenosis   . Respiratory failure ([place]) 09/09/2019  . COPD (chronic obstructive pulmonary disease) ([place]) 06/30/2019  . Acute on chronic respiratory failure with hypoxia and hypercapnia ([place]) 06/29/2019  . GERD (gastroesophageal reflux disease)   . Pancreatic insufficiency   . Nausea & vomiting 04/03/2019  . Abdominal pain 04/03/2019  . Abnormal finding on urinalysis 04/03/2019  . Acute respiratory failure with hypoxia and hypercapnia ([place]) 04/02/2019  . COPD with acute exacerbation ([place]) 06/15/2018  . Essential hypertension 06/15/2018  . Genetic testing 06/01/2018  . Gastrinoma   . Family history of melanoma   . Family history of brain cancer   . Hypokalemia 10/19/2011  . Severe protein-calorie malnutrition ([place]) 10/19/2011  . Weight loss 10/19/2011  . Generalized weakness 10/19/2011  . Dysphagia 10/19/2011  . Anodontia 10/19/2011   PCP:  Clinic, [place]:   [place], [place] [address] [place] [address] Phone: [phone] Fax: [phone]  [place], [place] - [place] [phone] [place] [address] Phone: [phone] Fax: [phone]     Social Determinants of Health (SDOH) Interventions    Readmission Risk Interventions Readmission Risk Prevention Plan 10/05/2019  Transportation Screening Complete  PCP or Specialist Appt within 3-5 Days Complete  HRI or [place] Not Complete  Social Work Consult for [place] Planning/Counseling Not Complete  Palliative Care Screening Not Applicable  Medication Review Press photographer) Complete  Some recent data might be hidden

## 2019-10-05 NOTE — Progress Notes (Addendum)
PROGRESS NOTE    Claudia Wiley  [id number] DOB: Sep 24, 1955 DOA: 10/01/2019 PCP: [facility]   Brief Narrative: Marguerette Garski is a 64 y.o. female with a history COPD/chronic respiratory failure with hypoxia on 2 L supplemental O2 via [place], history of lung cancer s/p LUL lobectomy, gastrinoma s/p Whipple's procedure, anxiety/depression, esophageal stricture and dilatation July 2019, hypertension, and recent admission for severe Candida esophagitis with esophageal necrosis. She presented secondary to dehydration secondary to recurrent emesis and lack of intake secondary to odynophagia.   Assessment & Plan:   Principal Problem:   Orthostasis Active Problems:   Essential hypertension   Pancreatic insufficiency   COPD (chronic obstructive pulmonary disease) (HCC)   Esophagitis   Candida esophagitis (HCC)   Candida esophagitis Odynophagia Duodenal ulcers Severe malnutrition Patient previous treated with full course of fluconazole. Concern for possible reoccurrence. Patient is very symptomatic with severe pain with swallowing. GI consulted and performed EGD on 3/21. -GI recommendations: liquids only by mouth; alternate mode of nutrition to allow healing of upper GI tract -Continue Fluconazole IV -Full liquid as tolerated -TPN  Acute respiratory failure with hypercapnia Patient with a pH of 7.251 initially and pCO2 of 63.5. Patient placed on bipap with improved pH of 7.309 and pCO2 of 51.6 after 90 minutes. Patient is baseline -Discontinue bipap  Opioid poisoning Iatrogenic. Patient was tolerating narcotic regimen but acutely worsened on 3/21 with increased lethargy. Dilaudid discontinued. Toradol used prn and now discontinued -Tylenol for pain -Oxycodone as needed as well  Orthostasis Secondary to dehydration. Will manage with IV fluids  Dehydration Secondary to inability to maintain oral intake -IV fluids  Anasarca Likely secondary to hypoalbuminemia and poor  nutritional status. Recent Transthoracic Echocardiogram significant for normal functioning heart. On previous admission, also had fluid retention possibly leading to respiratory failure requiring Lasix. Personally reviewed chest x-ray and does not appear to have and significant pleural effusion. -Will hold on lasix for now but will watch fluid status carefully -Daily weights/strict in and out -Chest x-ray obtained as mentioned below.  COPD Chronic respiratory failure with hypoxia Baseline uses 2L via nasal canula. Some wheezing today -Continue 2L O2 via [employer]  -Dulera, Incruse Ellipta -Albuterol -Chest x-ray   DVT prophylaxis: Lovenox Code Status:   Code Status: Full Code Family Communication: None at bedside. Daughter on telephone Disposition Plan: Discharge pending improvement of odynophagia, transition back to oral diet   Consultants:  [facility]  Procedures:  None  Antimicrobials: Fluconazole    Subjective: No concerns  Objective: Vitals:   10/05/19 0850 10/05/19 0858 10/05/19 0900 10/05/19 1000  BP: (!) 164/81  (!) 133/92 (!) 148/75  Pulse: 92  97 89  Resp: 18  15 14   Temp:      TempSrc:      SpO2: 100% 98% 98% 100%  Weight:      Height:        Intake/Output Summary (Last 24 hours) at 10/05/2019 1039 Last data filed at 10/05/2019 1012 Gross per 24 hour  Intake 1020.57 ml  Output 1125 ml  Net -104.43 ml   Filed Weights   10/03/19 1100 10/04/19 0400 10/05/19 0500  Weight: 36.2 kg 36.2 kg 39 kg    Examination:  General exam: Appears calm and comfortable Respiratory system: Wheezing on right side. Some accessory muscle usage around neck Cardiovascular system: S1 & S2 heard, RRR. No murmurs, rubs, gallops or clicks. Gastrointestinal system: Abdomen is nondistended, soft and nontender. No organomegaly or masses felt. Normal bowel sounds heard.  Central nervous system: Alert and oriented. No focal neurological deficits. Extremities: Upper/lower extremity  edema. No calf tenderness Skin: No cyanosis. No rashes Psychiatry: Judgement and insight appear normal. Depressed mood.    Data Reviewed: I have personally reviewed following labs and imaging studies  CBC: Recent Labs  Lab 10/01/19 1644 10/04/19 0455  WBC 9.5 16.1*  NEUTROABS 4.4 10.9*  HGB 13.1 10.2*  HCT 42.0 35.5*  MCV 92.9 100.6*  PLT 528* [id number]   Basic Metabolic Panel: Recent Labs  Lab 10/01/19 1644 10/03/19 1037 10/03/19 1305 10/04/19 0455 10/05/19 0415  NA 144  --  143 142 145  K 3.7  --  4.1 4.2 2.7*  CL 103  --  107 107 106  CO2 30  --  27 24 33*  GLUCOSE 98  --  107* 81 135*  BUN 19  --  8 <5* <5*  CREATININE 0.53  --  0.44 0.37* <0.30*  CALCIUM 9.2  --  8.3* 8.1* 7.7*  MG 1.7  --  1.4* 1.8 1.6*  PHOS  --  2.8  --  1.7* 1.3*   GFR: CrCl cannot be calculated (This lab value cannot be used to calculate CrCl because it is not a number: <0.30). Liver Function Tests: Recent Labs  Lab 10/01/19 1644 10/03/19 1305 10/04/19 0455  AST 26 21 19   ALT 26 21 17   ALKPHOS 85 68 68  BILITOT 0.7 0.7 0.8  PROT 6.8 5.2* 5.0*  ALBUMIN 3.8 2.8* 2.6*   Recent Labs  Lab 10/01/19 1644  LIPASE 21   No results for input(s): AMMONIA in the last 168 hours. Coagulation Profile: No results for input(s): INR, PROTIME in the last 168 hours. Cardiac Enzymes: No results for input(s): CKTOTAL, CKMB, CKMBINDEX, TROPONINI in the last 168 hours. BNP (last 3 results) No results for input(s): PROBNP in the last 8760 hours. HbA1C: No results for input(s): HGBA1C in the last 72 hours. CBG: Recent Labs  Lab 10/03/19 1644 10/03/19 2058 10/04/19 0014 10/04/19 2315 10/05/19 0510  GLUCAP 95 88 82 132* 135*   Lipid Profile: Recent Labs    10/04/19 0455  TRIG 103   Thyroid Function Tests: No results for input(s): TSH, T4TOTAL, FREET4, T3FREE, THYROIDAB in the last 72 hours. Anemia Panel: No results for input(s): VITAMINB12, FOLATE, FERRITIN, TIBC, IRON, RETICCTPCT in the  last 72 hours. Sepsis Labs: No results for input(s): PROCALCITON, LATICACIDVEN in the last 168 hours.  Recent Results (from the past 240 hour(s))  SARS CORONAVIRUS 2 (TAT 6-24 HRS) Nasopharyngeal Nasopharyngeal Swab     Status: None   Collection Time: 10/01/19  9:58 PM   Specimen: Nasopharyngeal Swab  Result Value Ref Range Status   SARS Coronavirus 2 NEGATIVE NEGATIVE Final    Comment: (NOTE) SARS-CoV-2 target nucleic acids are NOT DETECTED. The SARS-CoV-2 RNA is generally detectable in upper and lower respiratory specimens during the acute phase of infection. Negative results do not preclude SARS-CoV-2 infection, do not rule out co-infections with other pathogens, and should not be used as the sole basis for treatment or other patient management decisions. Negative results must be combined with clinical observations, patient history, and epidemiological information. The expected result is Negative. Fact Sheet for Patients: SugarRoll.be Fact Sheet for Healthcare Providers: https://www.woods-mathews.com/ This test is not yet approved or cleared by the [place] FDA and  has been authorized for detection and/or diagnosis of SARS-CoV-2 by FDA under an Emergency Use Authorization (EUA). This EUA will remain  in effect (meaning this  test can be used) for the duration of the COVID-19 declaration under Section 56 4(b)(1) of the Act, 21 U.S.C. section 360bbb-3(b)(1), unless the authorization is terminated or revoked sooner. Performed at [facility], [place] [address] [place], [place] [address]          Radiology Studies: CT CHEST WO CONTRAST  Result Date: 10/03/2019 CLINICAL DATA:  Pain status post EGD. EXAM: CT CHEST WITHOUT CONTRAST TECHNIQUE: Multidetector CT imaging of the chest was performed following the standard protocol without IV contrast. COMPARISON:  None. FINDINGS: Cardiovascular: Heart size is normal. There is a trace  pericardial effusion. There are atherosclerotic changes of the thoracic aorta without evidence for a thoracic aortic aneurysm. There are coronary artery calcifications. Mediastinum/Nodes: --No mediastinal or hilar lymphadenopathy. --No axillary lymphadenopathy. --No supraclavicular lymphadenopathy. --Normal thyroid gland. --there is mild diffuse wall thickening of the distal esophagus which is nonspecific. Lungs/Pleura: Patient appears to be status post prior wedge resection involving the left upper lobe. There are moderate to severe emphysematous changes bilaterally. There is no pneumothorax. There is a small right-sided pleural effusion. There is a trace left-sided pleural effusion. There is mild scattered bronchial wall thickening and mucus plugging bilaterally. Upper Abdomen: No acute abnormality. Musculoskeletal: No chest wall abnormality. No acute or significant osseous findings. IMPRESSION: 1. Mild diffuse esophageal wall thickening which is nonspecific but may represent esophagitis. There is no evidence for extraluminal contrast or free air to suggest a perforation. 2. Small right-sided pleural effusion.  No pneumothorax. 3. Moderate emphysematous changes. There are postsurgical changes in the left upper lobe related to prior resection. Aortic Atherosclerosis (ICD10-I70.0) and Emphysema (ICD10-J43.9). Electronically Signed   By: Constance Holster M.D.   On: 10/03/2019 22:48   [place] EKG Site Rite  Result Date: 10/04/2019 If [facility] image not attached, placement could not be confirmed due to current cardiac rhythm.       Scheduled Meds:  Chlorhexidine Gluconate Cloth  6 each Topical Daily   enoxaparin (LOVENOX) injection  30 mg Subcutaneous Daily   feeding supplement (ENSURE ENLIVE)  237 mL Oral QID   insulin aspart  0-9 Units Subcutaneous Q6H   mouth rinse  15 mL Mouth Rinse BID   metoCLOPramide (REGLAN) injection  5 mg Intravenous TID PC & HS   mometasone-formoterol  2 puff Inhalation BID     pantoprazole (PROTONIX) IV  40 mg Intravenous Q12H   sodium chloride flush  10-40 mL Intracatheter Q12H   sodium chloride flush  3 mL Intravenous Q12H   sucralfate  1 g Oral TID WC & HS   umeclidinium bromide  1 puff Inhalation Daily   Continuous Infusions:  sodium chloride 45 mL/hr at 10/05/19 0507   fluconazole (DIFLUCAN) IV Stopped (10/04/19 2224)   potassium PHOSPHATE IVPB (in mmol) 30 mmol (10/05/19 0851)   TPN ADULT (ION) 30 mL/hr at 10/05/19 0100   TPN ADULT (ION)       LOS: 3 days     Cordelia Poche, MD [facility] 10/05/2019, 10:39 AM  If 7PM-7AM, please contact night-coverage [email or web address]

## 2019-10-05 NOTE — Progress Notes (Signed)
Nutrition Follow-up  DOCUMENTATION CODES:   Severe malnutrition in context of chronic illness, Underweight  INTERVENTION:  TPN per Pharmacist -currently at 30 ml/hr providing 818 kcal and 41 grams of protein  Continue Ensure supplement 4x/daily (pt likes strawberry flavor and served cold)  Provide Magic cup BID with meals, each supplement provides 290 kcal and 9 grams of protein (vanilla)   NUTRITION DIAGNOSIS:   Severe Malnutrition related to chronic illness(COPD/chronic respiratory failure with hypoxia on 2 L O2 at baseline, severe esophagitis, esophageal stenosis) as evidenced by moderate fat depletion, severe fat depletion, moderate muscle depletion, severe muscle depletion, percent weight loss.  New nutrition diagnosis  GOAL:   Patient will meet greater than or equal to 90% of their needs  Progressing  MONITOR:   Weight trends, Labs, I & O's, Diet advancement, Supplement acceptance, PO intake  REASON FOR ASSESSMENT:   Malnutrition Screening Tool    ASSESSMENT:  64 year old female with past medical history of COPD, chronic respiratory failure with hypoxia on 2 L O2 at home, history of lung cancer s/p LUL lobectomy, gastrinoma s/p Whipple procedure, esophageal stricture (dilation 2019), HTN, and recent admission for severe Candida esophagitis with esophageal necrosis. Patient presented to ED for recurrent emesis and lack of intake secondary to odynophagia.  3/21 EGD - severe esophagitis with ulceration and esophageal stenosis 3/22 PICC 3/22 TPN  Per notes: -BiPAP discontinued; on 2 L O2 via [place] -increased lethargy on 3/21, dilaudid and prn toradol discontinued -anasarca; hold on lasix, strict I/Os and daily wts -TPN at 30 ml/hr, will not advance due to refeeding  Patient awake with flat affect and watching television this morning, 2 strawberry Ensure on bedside tray. Patient endorses drinking supplements without difficulty and prefers them cold. At home, she recalls  drinking 4 strawberry Boost Plus daily and tolerates chicken noodle soup and creamed potatoes. RD will update medication admin instructions with patient flavor and temperature preferences. Patient reports that she likes ice cream and amenable to trying vanilla Magic Cup with meals.  Non-pitting BUE edema noted per RN assessment  Patient states that she has never been a big eater in general and recalls usually weighing 80-90 lbs.  Current wt 86 lbs  Medications reviewed and include: SSI, Reglan IVF: NaCl IVPB: Diflucan Potassium Phosphate 30 mmol in D5 TPN @ 30 ml/hr   Labs: CBGs 155,135,132 x 24 hrs, BUN <5 (L), Cr 0.30 (L),  K 2.7 (L), P 1.3 (L), Mg 1.6 (L)   NUTRITION - FOCUSED PHYSICAL EXAM:    Most Recent Value  Orbital Region  Severe depletion  Upper Arm Region  Severe depletion  Thoracic and Lumbar Region  Severe depletion  Buccal Region  Severe depletion  Temple Region  Moderate depletion  Clavicle Bone Region  Moderate depletion  Clavicle and Acromion Bone Region  Severe depletion  Scapular Bone Region  Unable to assess  Dorsal Hand  Mild depletion  Patellar Region  Severe depletion  Anterior Thigh Region  Moderate depletion  Posterior Calf Region  Severe depletion  Edema (RD Assessment)  None  Hair  Reviewed  Eyes  Reviewed  Mouth  Reviewed [poor dentition]  Skin  Reviewed  Nails  Reviewed       Diet Order:   Diet Order            Diet full liquid Room service appropriate? Yes; Fluid consistency: Thin  Diet effective now              EDUCATION  NEEDS:   No education needs have been identified at this time  Skin:  Skin Assessment: Reviewed RN Assessment  Last BM:  3/18  Height:   Ht Readings from Last 1 Encounters:  10/01/19 4' 11.02" (1.499 m)    Weight:   Wt Readings from Last 1 Encounters:  10/05/19 39 kg    Ideal Body Weight:     BMI:  Body mass index is 17.36 kg/m.  Estimated Nutritional Needs:   Kcal:  1450-1750  Protein:   73-88  Fluid:  >/= 1.4 L/day   Lajuan Lines, RD, LDN Clinical Nutrition After Hours/Weekend Pager # in [place]

## 2019-10-05 NOTE — Progress Notes (Addendum)
[facility] Progress Note  CC:  Esophageal necrosis   Subjective: She reports swallowing jello and water without difficulty. Her RN confirms this. No abdominal pain. No N/V. No BM today. TPN infusing.    Objective:  Vital signs in last 24 hours: Temp:  [97.8 F (36.6 C)-98.2 F (36.8 C)] 98 F (36.7 C) (03/23 1139) Pulse Rate:  [87-101] 88 (03/23 1100) Resp:  [12-31] 16 (03/23 1100) BP: (102-164)/(51-92) 131/86 (03/23 1100) SpO2:  [94 %-100 %] 100 % (03/23 1100) Weight:  [39 kg] 39 kg (03/23 0500) Last BM Date: (unknown) General:   Alert, fatigued appearing 64 year old female in NAD.  Heart: RRR, no murmur.  Pulm: Breath sounds clear, diminished in the bases bilaterally.  Abdomen: Soft, nondistended. Positive bowel sounds x 4 quads. Upper abdominal scar and lower scar intact.  Extremities:  Without edema. Neurologic:  Alert and  oriented x4. Speech is clear. Moves all extremities equally but weak.  Psych:  Alert and cooperative. Normal mood and affect.  Intake/Output from previous day: 03/22 0701 - 03/23 0700 In: 1017.6 [I.V.:564.9; IV Piggyback:452.7] Out: 1125 [Urine:1125] Intake/Output this shift: Total I/O In: 3 [I.V.:3] Out: -   Lab Results: Recent Labs    10/04/19 0455  WBC 16.1*  HGB 10.2*  HCT 35.5*  PLT 284   BMET Recent Labs    10/03/19 1305 10/04/19 0455 10/05/19 0415  NA 143 142 145  K 4.1 4.2 2.7*  CL 107 107 106  CO2 27 24 33*  GLUCOSE 107* 81 135*  BUN 8 <5* <5*  CREATININE 0.44 0.37* <0.30*  CALCIUM 8.3* 8.1* 7.7*   LFT Recent Labs    10/04/19 0455  PROT 5.0*  ALBUMIN 2.6*  AST 19  ALT 17  ALKPHOS 68  BILITOT 0.8   PT/INR No results for input(s): LABPROT, INR in the last 72 hours. Hepatitis Panel No results for input(s): HEPBSAG, HCVAB, HEPAIGM, HEPBIGM in the last 72 hours.  CT CHEST WO CONTRAST  Result Date: 10/03/2019 CLINICAL DATA:  Pain status post EGD. EXAM: CT CHEST WITHOUT CONTRAST TECHNIQUE:  Multidetector CT imaging of the chest was performed following the standard protocol without IV contrast. COMPARISON:  None. FINDINGS: Cardiovascular: Heart size is normal. There is a trace pericardial effusion. There are atherosclerotic changes of the thoracic aorta without evidence for a thoracic aortic aneurysm. There are coronary artery calcifications. Mediastinum/Nodes: --No mediastinal or hilar lymphadenopathy. --No axillary lymphadenopathy. --No supraclavicular lymphadenopathy. --Normal thyroid gland. --there is mild diffuse wall thickening of the distal esophagus which is nonspecific. Lungs/Pleura: Patient appears to be status post prior wedge resection involving the left upper lobe. There are moderate to severe emphysematous changes bilaterally. There is no pneumothorax. There is a small right-sided pleural effusion. There is a trace left-sided pleural effusion. There is mild scattered bronchial wall thickening and mucus plugging bilaterally. Upper Abdomen: No acute abnormality. Musculoskeletal: No chest wall abnormality. No acute or significant osseous findings. IMPRESSION: 1. Mild diffuse esophageal wall thickening which is nonspecific but may represent esophagitis. There is no evidence for extraluminal contrast or free air to suggest a perforation. 2. Small right-sided pleural effusion.  No pneumothorax. 3. Moderate emphysematous changes. There are postsurgical changes in the left upper lobe related to prior resection. Aortic Atherosclerosis (ICD10-I70.0) and Emphysema (ICD10-J43.9). Electronically Signed   By: Constance Holster M.D.   On: 10/03/2019 22:48   [place] EKG Site Rite  Result Date: 10/04/2019 If [facility] image not attached, placement could not  be confirmed due to current cardiac rhythm.   Assessment / Plan:  38. 64 year old female with a history a significant PMH including COPD, gastrinoma s/p Whipple in 2015, lung cancer s/p left lung lobectomy with acute esophageal necrosis/fungal. S/P  EGD 3/21 showed severe esophagitis with ulceration, repair / regeneration / candidiasis / lumen narrowing, 3 cm hh. Esophageal biopsies pending. Receiving TPN via PICC line.  -Continue IV Fluconazole for now -Continue Carafate suspension  -Continue BID IV PPI -Continue IV Reglan  -Eventual repeat EGD with esophageal dilatation.   2. Hypokalemia  -KCL replacement per the hospitalist  -Repeat BMP in am  3. Leukocytosis. WBC 16.1 on 3/22. CBC not done today. She is afebrile. -CBC with diff in am  Further recommendations per Dr. Henrene Pastor later today   Principal Problem:   Orthostasis Active Problems:   Essential hypertension   Pancreatic insufficiency   COPD (chronic obstructive pulmonary disease) (HCC)   Esophagitis   Candida esophagitis (HCC)     LOS: 3 days   Claudia Wiley  10/05/2019, 11:59 AM  GI ATTENDING  Interval history data reviewed.  Previous endoscopy report and biopsies reviewed.  Patient stable. Impression: 1.  Severe Candida reflux esophagitis 2.  Esophageal stricturing 3.  Difficulty feeding with nutritional deficiencies Recommendations: 1.  Complete a 10-day course of fluconazole 2.  Continue Carafate suspension for 10 days then stop 3.  When able to take p.o.'s convert IV PPI to pantoprazole 40 mg twice daily.  Continue indefinitely. 4.  Can stop IV Reglan when having no further problems with nausea or vomiting 5.  Limit food to clear liquids, full liquids, and pured items ONLY (for now) given the degree of esophageal stricturing.  She should be able to take adequate calories despite these restrictions. 6.  Continue TNA until it is clear that her caloric intake is adequate without parenteral support 7.  Outpatient GI follow-up with Dr. Fuller Plan in about 4 weeks after hospital discharge.  Thereafter, on proper medical therapy, arrangements can be made for her to undergo upper endoscopy with probable esophageal dilation.  Please contact us if you have  any questions or new problems.  We will sign off.  Docia Chuck. Geri Seminole., M.D. [facility] Division of Gastroenterology

## 2019-10-05 NOTE — Progress Notes (Signed)
Agree with previous RN assessment. Will continue to monitor patient. Telemetry placed on patient and call bell within reach.

## 2019-10-05 NOTE — Progress Notes (Signed)
CRITICAL VALUE ALERT  Critical Value:  Potassium 2.9  Date & Time Notied:  10/05/2019 0515   Provider Notified: Tylene Fantasia, NP  Orders Received/Actions taken: See new orders.

## 2019-10-05 NOTE — Plan of Care (Signed)
Skin assessment complete- skin intact, turned patient with pillow support. Offered patient ensure at this time and she refused, stating she isn't hungry. Will continue to offer fluids.  Problem: Education: Goal: Knowledge of General Education information will improve Description: Including pain rating scale, medication(s)/side effects and non-pharmacologic comfort measures Outcome: Progressing   Problem: Health Behavior/Discharge Planning: Goal: Ability to manage health-related needs will improve Outcome: Progressing   Problem: Clinical Measurements: Goal: Ability to maintain clinical measurements within normal limits will improve Outcome: Progressing Goal: Will remain free from infection Outcome: Progressing Goal: Diagnostic test results will improve Outcome: Progressing Goal: Respiratory complications will improve Outcome: Progressing Goal: Cardiovascular complication will be avoided Outcome: Progressing   Problem: Activity: Goal: Risk for activity intolerance will decrease Outcome: Progressing   Problem: Nutrition: Goal: Adequate nutrition will be maintained Outcome: Progressing   Problem: Coping: Goal: Level of anxiety will decrease Outcome: Progressing   Problem: Elimination: Goal: Will not experience complications related to bowel motility Outcome: Progressing Goal: Will not experience complications related to urinary retention Outcome: Progressing   Problem: Pain Managment: Goal: General experience of comfort will improve Outcome: Progressing   Problem: Safety: Goal: Ability to remain free from injury will improve Outcome: Progressing   Problem: Skin Integrity: Goal: Risk for impaired skin integrity will decrease Outcome: Progressing

## 2019-10-05 NOTE — Progress Notes (Signed)
Called and updated daughter about patient transfer and room number. Will transfer patient shortly.

## 2019-10-05 NOTE — Progress Notes (Signed)
Pharmacy - TPN  Assessment:    Please see note from Leodis Sias, PharmD earlier today for full details.  Briefly, 64 y.o. female on TPN for intolerance to enteral feeding. Showing signs of refeeding today and electrolytes replaced this AM. Labs rechecked this PM  BMET    Component Value Date/Time   NA 143 10/05/2019 1905   K 3.0 (L) 10/05/2019 1905   CL 104 10/05/2019 1905   CO2 34 (H) 10/05/2019 1905   GLUCOSE 169 (H) 10/05/2019 1905   BUN <5 (L) 10/05/2019 1905   CREATININE <0.30 (L) 10/05/2019 1905   CALCIUM 7.5 (L) 10/05/2019 1905   GFRNONAA NOT CALCULATED 10/05/2019 1905   GFRAA NOT CALCULATED 10/05/2019 1905     Plan:   Mg 2g IV x 1  KCl 10 mEq IV x 3  KPhos 15 mmol IV x 1 (22.5 mEq K+)   Reuel Boom, PharmD, BCPS [phone] 10/05/2019, 9:40 PM

## 2019-10-06 DIAGNOSIS — K8689 Other specified diseases of pancreas: Secondary | ICD-10-CM

## 2019-10-06 DIAGNOSIS — E43 Unspecified severe protein-calorie malnutrition: Secondary | ICD-10-CM | POA: Insufficient documentation

## 2019-10-06 DIAGNOSIS — I10 Essential (primary) hypertension: Secondary | ICD-10-CM

## 2019-10-06 DIAGNOSIS — I951 Orthostatic hypotension: Secondary | ICD-10-CM

## 2019-10-06 LAB — CBC
HCT: 28.7 % — ABNORMAL LOW (ref 36.0–46.0)
Hemoglobin: 8.9 g/dL — ABNORMAL LOW (ref 12.0–15.0)
MCH: 29.3 pg (ref 26.0–34.0)
MCHC: 31 g/dL (ref 30.0–36.0)
MCV: 94.4 fL (ref 80.0–100.0)
Platelets: 221 10*3/uL (ref 150–400)
RBC: 3.04 MIL/uL — ABNORMAL LOW (ref 3.87–5.11)
RDW: 15.5 % (ref 11.5–15.5)
WBC: 5.8 10*3/uL (ref 4.0–10.5)
nRBC: 0 % (ref 0.0–0.2)

## 2019-10-06 LAB — BASIC METABOLIC PANEL
Anion gap: 4 — ABNORMAL LOW (ref 5–15)
BUN: 7 mg/dL — ABNORMAL LOW (ref 8–23)
CO2: 34 mmol/L — ABNORMAL HIGH (ref 22–32)
Calcium: 7.8 mg/dL — ABNORMAL LOW (ref 8.9–10.3)
Chloride: 104 mmol/L (ref 98–111)
Creatinine, Ser: 0.34 mg/dL — ABNORMAL LOW (ref 0.44–1.00)
GFR calc Af Amer: >60 mL/min (ref >60)
GFR calc non Af Amer: >60 mL/min (ref >60)
Glucose, Bld: 170 mg/dL — ABNORMAL HIGH (ref 70–99)
Potassium: 4.1 mmol/L (ref 3.5–5.1)
Sodium: 142 mmol/L (ref 135–145)

## 2019-10-06 LAB — GLUCOSE, CAPILLARY
Glucose-Capillary: 124 mg/dL — ABNORMAL HIGH (ref 70–99)
Glucose-Capillary: 129 mg/dL — ABNORMAL HIGH (ref 70–99)
Glucose-Capillary: 139 mg/dL — ABNORMAL HIGH (ref 70–99)
Glucose-Capillary: 149 mg/dL — ABNORMAL HIGH (ref 70–99)
Glucose-Capillary: 95 mg/dL (ref 70–99)

## 2019-10-06 LAB — MAGNESIUM: Magnesium: 2.1 mg/dL (ref 1.7–2.4)

## 2019-10-06 LAB — PHOSPHORUS: Phosphorus: 3.5 mg/dL (ref 2.5–4.6)

## 2019-10-06 MED ORDER — TRAVASOL 10 % IV SOLN
INTRAVENOUS | Status: AC
  Filled 2019-10-06: qty 403.2

## 2019-10-06 NOTE — Progress Notes (Addendum)
PHARMACY - TOTAL PARENTERAL NUTRITION CONSULT NOTE   Indication: Intolerance to enteral feeding  Patient Measurements: Height: 4' 11.02" (149.9 cm) Weight: 87 lb 15.4 oz (39.9 kg) IBW/kg (Calculated) : 43.24 TPN AdjBW (KG): 34.4 Body mass index is 17.76 kg/m. Usual Weight:   Assessment: [name] with hx esophageal stricture (s/p dilation in July 2019) who was hospitalized from 2/25-3/7 for severe esophagitis and odynophagia as well as duodenal ulcers.  She presented back to the ED on 3/19 with c/o SOB and esophageal pain. EGD on 3/21 showed severe esophagitis with ulceration and esophageal stenosis. GI team recommends to avoid "NG tube placement given recent esophageal injury" and  start TPN for nutrition.  Glucose / Insulin: no hx DM - CBGs 135 & 157 on TPN at 30 ml/hr, 6 U SSI given  Electrolytes: required repletion 3/23 with Mag 4gm, KCl 7 runs, KPhos total 45 mMol   Renal: SCr WNL LFTs / TGs: LFTs wnl, Trig 103 Prealbumin / albumin:  - albumin low 2.6, prealbumin low 8.5 Intake / Output; MIVF:  - NS at 45 ml/hr, UOP 1.6L past 24 hr (improved) Imaging: Chest CT 3/21: mild, diffuse esophageal wall thickening, prob esophagitis, no free air, small R pleural effusion, mod emphysematous changes Surgeries / Procedures:  - 3/21 EGD: LA grade D esophagitis with contact bleeding from incisors. Scattered plaques consistent with candidiasis  Central access:  PICC placed 3/22 for TPN TPN start date: 3/22   Nutritional Goals (per RD recommendation on 3/20): Kcal:  1450-1750 Protein:  73-88 Fluid:  >/= 1.4 L/day  Goal TPN rate is 60 mL/hr (provides 81 g of protein and 1636 kcals per day)  Current Nutrition:  - Full liquid diet - Ensure ordered QID (up to 2 charted daily), added Magic Cups to trays - TPN  Plan: Plan 10 days Fluconazole, Carafate per GI note, encouraging po intake  At 1800:  Continue TPN at 30 mL/hr at 1800-will not advance due to refeeding  encouraging po intake  - Electrolytes in TPN: 27mEq/L - Na, 22mEq/L - K, 61mEq/L - Ca, 17mEq/L - Mg, 17mmol/L - Phos. - Cl:Ac ratio 1:1 - Add trace elements to TPN; MVI on MWF only due to national backorder - continue Sensitive q6h SSI and adjust as needed  - continue MIVF at 45 mL/hr or per MD - Monitor TPN labs on Mon/Thurs  Minda Ditto PharmD 10/06/2019, 7:19 AM

## 2019-10-06 NOTE — Progress Notes (Signed)
[facility] PROGRESS NOTE    Progress Note  Claudia Wiley  [id number] DOB: 15-Jun-1956 DOA: 10/01/2019 PCP: [facility]     Brief Narrative:   Claudia Wiley is an 64 y.o. female past medical history of COPD on home O2 history of lung cancer status post left upper lobe lobectomy, gastrinoma status post Whipple procedure esophageal stricture and dilation in July 2019 with a recent admission for severe Candida esophagitis and esophageal necrosis presents secondary to dehydration in the setting of recurrent emesis and decreased oral intake secondary to odynophagia  Assessment/Plan:   Candida esophagitis due to odynophagia: Patient was previously treated with a full course of consult there is a high concern for recurrence.  She was started on admission on IV fluconazole GI was consulted to perform an EGD on 10/03/2019 showed severe esophagitis with ulceration with bleeding, some esophageal stenosis developing from recent injury. She was started on TPN, she has been tolerating her full liquid diet. Was consulted they recommended to continue Carafate and Protonix IV twice daily.  They also recommended to continue IV Phenergan. They recommended an eventual EGD in the future with esophageal dilation.  Acute respiratory failure with hypercapnia: Patient was placed on BiPAP temporarily and is now resolved, her BiPAP was discontinued.  Opioid poisoning: She became lethargic on 10/03/2019 Dilaudid was discontinued, now she is on oxycodone and Tylenol as needed.  Orthostatic hypotension: Likely due to decreased oral intake she was started on IV fluids slowly improving.  Anasarca: Likely due to hypoalbuminemia in the setting of poor nutritional status.  Chronic respiratory failure with hypoxia: Due to COPD continue current home oxygen.  Essential hypertension Continue to hold antihypertensive medication   Protein caloric malnutrition Estimated body mass index is 17.76 kg/m  as calculated from the following:   Height as of this encounter: 4' 11.02" (1.499 m).   Weight as of this encounter: 39.9 kg. Malnutrition Type:  Nutrition Problem: Severe Malnutrition Etiology: chronic illness(COPD/chronic respiratory failure with hypoxia on 2 L O2 at baseline, severe esophagitis, esophageal stenosis)   Malnutrition Characteristics:  Signs/Symptoms: moderate fat depletion, severe fat depletion, moderate muscle depletion, severe muscle depletion, percent weight loss Percent weight loss: 14 %   Nutrition Interventions:  Interventions: Ensure Enlive (each supplement provides 350kcal and 20 grams of protein), Magic cup, TPN  DVT prophylaxis: lovenox Family Communication: Daughter Disposition Plan/Barrier to D/C: Home with home health therapy, when she is proving to she gets tolerated solid diet she is currently on liquid diet.  Code Status:     Code Status Orders  (From admission, onward)         Start     Ordered   10/02/19 1817  Full code  Continuous     10/02/19 1816        Code Status History    Date Active Date Inactive Code Status Order ID Comments User Context   10/01/2019 2157 10/02/2019 1816 DNR [id number]  Lenore Cordia, MD ED   09/09/2019 2119 09/19/2019 1850 Full Code [id number]  Nita Sells, MD ED   06/29/2019 0947 07/01/2019 2116 Full Code [id number]  Reubin Milan, MD ED   04/02/2019 2138 04/07/2019 1856 Full Code [id number]  Jani Gravel, MD ED   06/15/2018 2152 06/18/2018 2205 Full Code [id number]  Rise Patience, MD ED   Advance Care Planning Activity    Advance Directive Documentation     Most Recent Value  Type of Advance Directive  Living will, Healthcare Power of Attorney  Pre-existing  out of facility DNR order (yellow form or pink MOST form)  --  "MOST" Form in Place?  --        IV Access:    Peripheral IV   Procedures and diagnostic studies:   DG CHEST PORT 1 VIEW  Result Date: 10/05/2019 CLINICAL DATA:   Shortness of breath. EXAM: PORTABLE CHEST 1 VIEW COMPARISON:  10/01/2019 and chest CT from 10/03/2019 FINDINGS: Nodular density overlying the right lower chest is most compatible with a nipple shadow based on the prior imaging. Stable hyperinflation. Sutures in the medial upper left lung. Stable scarring at the left lung base. A right arm PICC line has been placed. PICC line tip is in the lower SVC region. Heart size is stable. Trachea is midline. Negative for a pneumothorax. IMPRESSION: No acute cardiopulmonary disease. PICC line tip in the lower SVC region. Electronically Signed   By: Markus Daft M.D.   On: 10/05/2019 09:17     Medical Consultants:    None.  Anti-Infectives:   IV Diflucan  Subjective:    Claudia Wiley is minimally verbal today.  Objective:    Vitals:   10/05/19 1801 10/05/19 2104 10/06/19 0447 10/06/19 0848  BP: 127/71 119/76    Pulse: (!) 102 93    Resp:  19    Temp: 98.8 F (37.1 C) 98.1 F (36.7 C)    TempSrc: Oral Oral    SpO2: 97% 100%  96%  Weight:   39.9 kg   Height:       SpO2: 96 % O2 Flow Rate (L/min): 3 L/min FiO2 (%): 30 %   Intake/Output Summary (Last 24 hours) at 10/06/2019 1131 Last data filed at 10/06/2019 0651 Gross per 24 hour  Intake 2971.88 ml  Output 1300 ml  Net 1671.88 ml   Filed Weights   10/04/19 0400 10/05/19 0500 10/06/19 0447  Weight: 36.2 kg 39 kg 39.9 kg    Exam: General exam: In no acute distress. Respiratory system: Good air movement and clear to auscultation. Cardiovascular system: S1 & S2 heard, RRR. No JVD. Gastrointestinal system: Abdomen is nondistended, soft and nontender.  Extremities: No pedal edema. Skin: No rashes, lesions or ulcers Psychiatry: Appears withdrawn.   Data Reviewed:    Labs: Basic Metabolic Panel: Recent Labs  Lab 10/01/19 1644 10/03/19 1037 10/03/19 1305 10/03/19 1305 10/04/19 0455 10/04/19 0455 10/05/19 0415 10/05/19 0415 10/05/19 1905 10/06/19 0405  NA   < >  --  143   --  142  --  145  --  143 142  K   < >  --  4.1   < > 4.2   < > 2.7*   < > 3.0* 4.1  CL   < >  --  107  --  107  --  106  --  104 104  CO2   < >  --  27  --  24  --  33*  --  34* 34*  GLUCOSE   < >  --  107*  --  81  --  135*  --  169* 170*  BUN   < >  --  8  --  <5*  --  <5*  --  <5* 7*  CREATININE   < >  --  0.44  --  0.37*  --  <0.30*  --  <0.30* 0.34*  CALCIUM   < >  --  8.3*  --  8.1*  --  7.7*  --  7.5*  7.8*  MG   < >  --  1.4*  --  1.8  --  1.6*  --  1.7 2.1  PHOS  --  2.8  --   --  1.7*  --  1.3*  --  2.3* 3.5   < > = values in this interval not displayed.   GFR Estimated Creatinine Clearance: 46.5 mL/min (A) (by C-G formula based on SCr of 0.34 mg/dL (L)). Liver Function Tests: Recent Labs  Lab 10/01/19 1644 10/03/19 1305 10/04/19 0455  AST 26 21 19   ALT 26 21 17   ALKPHOS 85 68 68  BILITOT 0.7 0.7 0.8  PROT 6.8 5.2* 5.0*  ALBUMIN 3.8 2.8* 2.6*   Recent Labs  Lab 10/01/19 1644  LIPASE 21   No results for input(s): AMMONIA in the last 168 hours. Coagulation profile No results for input(s): INR, PROTIME in the last 168 hours. COVID-19 Labs  No results for input(s): DDIMER, FERRITIN, LDH, CRP in the last 72 hours.  Lab Results  Component Value Date   SARSCOV2NAA NEGATIVE 10/01/2019   [place] NEGATIVE 09/09/2019   [place] NEGATIVE 06/29/2019   [place] NEGATIVE 04/02/2019    CBC: Recent Labs  Lab 10/01/19 1644 10/04/19 0455 10/06/19 0405  WBC 9.5 16.1* 5.8  NEUTROABS 4.4 10.9*  --   HGB 13.1 10.2* 8.9*  HCT 42.0 35.5* 28.7*  MCV 92.9 100.6* 94.4  PLT 528* 284 221   Cardiac Enzymes: No results for input(s): CKTOTAL, CKMB, CKMBINDEX, TROPONINI in the last 168 hours. BNP (last 3 results) No results for input(s): PROBNP in the last 8760 hours. CBG: Recent Labs  Lab 10/05/19 0510 10/05/19 1155 10/05/19 1835 10/06/19 0042 10/06/19 0548  GLUCAP 135* 155* 157* 139* 149*   D-Dimer: No results for input(s): DDIMER in the last 72  hours. Hgb A1c: No results for input(s): HGBA1C in the last 72 hours. Lipid Profile: Recent Labs    10/04/19 0455  TRIG 103   Thyroid function studies: No results for input(s): TSH, T4TOTAL, T3FREE, THYROIDAB in the last 72 hours.  Invalid input(s): FREET3 Anemia work up: No results for input(s): VITAMINB12, FOLATE, FERRITIN, TIBC, IRON, RETICCTPCT in the last 72 hours. Sepsis Labs: Recent Labs  Lab 10/01/19 1644 10/04/19 0455 10/06/19 0405  WBC 9.5 16.1* 5.8   Microbiology Recent Results (from the past 240 hour(s))  SARS CORONAVIRUS 2 (TAT 6-24 HRS) Nasopharyngeal Nasopharyngeal Swab     Status: None   Collection Time: 10/01/19  9:58 PM   Specimen: Nasopharyngeal Swab  Result Value Ref Range Status   SARS Coronavirus 2 NEGATIVE NEGATIVE Final    Comment: (NOTE) SARS-CoV-2 target nucleic acids are NOT DETECTED. The SARS-CoV-2 RNA is generally detectable in upper and lower respiratory specimens during the acute phase of infection. Negative results do not preclude SARS-CoV-2 infection, do not rule out co-infections with other pathogens, and should not be used as the sole basis for treatment or other patient management decisions. Negative results must be combined with clinical observations, patient history, and epidemiological information. The expected result is Negative. Fact Sheet for Patients: SugarRoll.be Fact Sheet for Healthcare Providers: https://www.woods-mathews.com/ This test is not yet approved or cleared by the [place] FDA and  has been authorized for detection and/or diagnosis of SARS-CoV-2 by FDA under an Emergency Use Authorization (EUA). This EUA will remain  in effect (meaning this test can be used) for the duration of the COVID-19 declaration under Section 56 4(b)(1) of the Act, 21 U.S.C. section 360bbb-3(b)(1), unless the  authorization is terminated or revoked sooner. Performed at [facility],  [place] [address], [place], [place] [address]      Medications:   . Chlorhexidine Gluconate Cloth  6 each Topical Daily  . enoxaparin (LOVENOX) injection  30 mg Subcutaneous Daily  . feeding supplement (ENSURE ENLIVE)  237 mL Oral QID  . insulin aspart  0-9 Units Subcutaneous Q6H  . mouth rinse  15 mL Mouth Rinse BID  . metoCLOPramide (REGLAN) injection  5 mg Intravenous TID PC & HS  . mometasone-formoterol  2 puff Inhalation BID  . pantoprazole (PROTONIX) IV  40 mg Intravenous Q12H  . sodium chloride flush  10-40 mL Intracatheter Q12H  . sodium chloride flush  3 mL Intravenous Q12H  . sucralfate  1 g Oral TID WC & HS  . umeclidinium bromide  1 puff Inhalation Daily   Continuous Infusions: . sodium chloride 45 mL/hr at 10/05/19 0507  . fluconazole (DIFLUCAN) IV 200 mg (10/05/19 2137)  . TPN ADULT (ION) 30 mL/hr at 10/05/19 1737  . TPN ADULT (ION)        LOS: 4 days   Claudia Wiley  [facility]  10/06/2019, 11:31 AM

## 2019-10-07 LAB — COMPREHENSIVE METABOLIC PANEL
ALT: 14 U/L (ref 0–44)
AST: 18 U/L (ref 15–41)
Albumin: 2.2 g/dL — ABNORMAL LOW (ref 3.5–5.0)
Alkaline Phosphatase: 57 U/L (ref 38–126)
Anion gap: 5 (ref 5–15)
BUN: 9 mg/dL (ref 8–23)
CO2: 35 mmol/L — ABNORMAL HIGH (ref 22–32)
Calcium: 8.4 mg/dL — ABNORMAL LOW (ref 8.9–10.3)
Chloride: 101 mmol/L (ref 98–111)
Creatinine, Ser: <0.3 mg/dL — ABNORMAL LOW (ref 0.44–1.00)
Glucose, Bld: 107 mg/dL — ABNORMAL HIGH (ref 70–99)
Potassium: 4.2 mmol/L (ref 3.5–5.1)
Sodium: 141 mmol/L (ref 135–145)
Total Bilirubin: 0.1 mg/dL — ABNORMAL LOW (ref 0.3–1.2)
Total Protein: 4.5 g/dL — ABNORMAL LOW (ref 6.5–8.1)

## 2019-10-07 LAB — SURGICAL PATHOLOGY

## 2019-10-07 LAB — PHOSPHORUS: Phosphorus: 3.9 mg/dL (ref 2.5–4.6)

## 2019-10-07 LAB — GLUCOSE, CAPILLARY
Glucose-Capillary: 109 mg/dL — ABNORMAL HIGH (ref 70–99)
Glucose-Capillary: 105 mg/dL — ABNORMAL HIGH (ref 70–99)
Glucose-Capillary: 112 mg/dL — ABNORMAL HIGH (ref 70–99)
Glucose-Capillary: 105 mg/dL — ABNORMAL HIGH (ref 70–99)

## 2019-10-07 LAB — MAGNESIUM: Magnesium: 1.6 mg/dL — ABNORMAL LOW (ref 1.7–2.4)

## 2019-10-07 MED ORDER — TRAVASOL 10 % IV SOLN
INTRAVENOUS | Status: AC
  Filled 2019-10-07: qty 403.2

## 2019-10-07 MED ORDER — MAGNESIUM SULFATE 4 GM/100ML IV SOLN
4.0000 g | Freq: Once | INTRAVENOUS | Status: AC
  Administered 2019-10-07: 4 g via INTRAVENOUS
  Filled 2019-10-07: qty 100

## 2019-10-07 NOTE — Progress Notes (Signed)
PHARMACY - TOTAL PARENTERAL NUTRITION CONSULT NOTE   Indication: Intolerance to enteral feeding  Patient Measurements: Height: 4' 11.02" (149.9 cm) Weight: 91 lb 11.4 oz (41.6 kg) IBW/kg (Calculated) : 43.24 TPN AdjBW (KG): 34.4 Body mass index is 18.51 kg/m. Usual Weight:   Assessment: [name] with hx esophageal stricture (s/p dilation in July 2019) who was hospitalized from 2/25-3/7 for severe esophagitis and odynophagia as well as duodenal ulcers.  She presented back to the ED on 3/19 with c/o SOB and esophageal pain. EGD on 3/21 showed severe esophagitis with ulceration and esophageal stenosis. GI team recommends to avoid "NG tube placement given recent esophageal injury" and  start TPN for nutrition.  Glucose / Insulin: no hx DM - CBGs 105-129 on TPN at 30 ml/hr, 2 U SSI given  Electrolytes: Na, K, Ca, Phos WNL; Mag 1.6 (goal>2) Renal: SCr WNL LFTs / TGs: LFTs wnl, Trig 103 (3/22) Prealbumin / albumin:  - albumin low 2.2, prealbumin low 8.5 (3/22) Intake / Output; MIVF:  - NS at 45 ml/hr Imaging: Chest CT 3/21: mild, diffuse esophageal wall thickening, prob esophagitis, no free air, small R pleural effusion, mod emphysematous changes Surgeries / Procedures:  - 3/21 EGD: LA grade D esophagitis with contact bleeding from incisors. Scattered plaques consistent with candidiasis  Central access:  PICC placed 3/22 for TPN TPN start date: 3/22   Nutritional Goals (per RD recommendation on 3/20): Kcal:  1450-1750 Protein:  73-88 Fluid:  >/= 1.4 L/day  Goal TPN rate is 60 mL/hr (provides 81 g of protein and 1636 kcals per day)  Current Nutrition:  - Full liquid diet - Ensure ordered QID (up to 2 charted daily), added Magic Cups to trays - TPN  Plan: Magnesium 4gm IV x1 now  At 1800:  Continue TPN at 30 mL/hr at 1800-will not advance due to refeeding encouraging po intake  - Electrolytes in TPN: 34mEq/L - Na, 27mEq/L - K, 35mEq/L - Ca, inc 35mEq/L - Mg,  61mmol/L - Phos. - Cl:Ac ratio 1:1 - Add trace elements to TPN; MVI on MWF only due to national backorder - continue Sensitive q6h SSI and adjust as needed  - continue MIVF at 45 mL/hr or per MD - Monitor TPN labs on Mon/Thurs  Netta Cedars, PharmD, BCPS 10/07/2019, 8:09 AM

## 2019-10-07 NOTE — TOC Progression Note (Signed)
Transition of Care [facility]) - Progression Note    Patient Details  Name: Claudia Wiley MRN: [id number] Date of Birth: 1955/12/23  Transition of Care [facility]) CM/SW Contact  Ross Ludwig, [place] Phone Number: 10/07/2019, 5:48 PM  Clinical Narrative:    CSW attempted to contact [facility] to start preapproval for home health services through [facility].  CSW left a message awaiting for call back from [place] worker.  CSW also emailed [place] social worker to find out what the fax number is to send clinicals to her.  CSW awaiting for a response back.   Expected Discharge Plan: [place] Barriers to Discharge: Continued Medical Work up  Expected Discharge Plan and Services Expected Discharge Plan: [place] In-house Referral: Clinical Social Work     Living arrangements for the past 2 months: Single Family Home                                       Social Determinants of Health (SDOH) Interventions    Readmission Risk Interventions Readmission Risk Prevention Plan 10/05/2019  Transportation Screening Complete  PCP or Specialist Appt within 3-5 Days Complete  HRI or [place] Not Complete  Social Work Consult for [place] Planning/Counseling Not Complete  Palliative Care Screening Not Applicable  Medication Review Press photographer) Complete  Some recent data might be hidden

## 2019-10-07 NOTE — Progress Notes (Signed)
[facility] PROGRESS NOTE    Progress Note  Claudia Wiley  [id number] DOB: 1956/06/13 DOA: 10/01/2019 PCP: [facility]     Brief Narrative:   Claudia Wiley is an 64 y.o. female past medical history of COPD on home O2 history of lung cancer status post left upper lobe lobectomy, gastrinoma status post Whipple procedure esophageal stricture and dilation in July 2019 with a recent admission for severe Candida esophagitis and esophageal necrosis presents secondary to dehydration in the setting of recurrent emesis and decreased oral intake secondary to odynophagia  Assessment/Plan:   Candida esophagitis due to odynophagia: Patient was previously treated with a full course of fluconazole as an outpatient. On admission she was started on admission on IV fluconazole GI was consulted to perform an EGD on 10/03/2019 showed severe esophagitis with ulceration with bleeding, some esophageal stenosis developing from recent injury. She was started on TPN, she has been tolerating her full liquid diet. Was consulted they recommended to continue Carafate and Protonix IV twice daily and Phenergan. The patient is still not tolerating her clear liquid diet.  Acute respiratory failure with hypercapnia: Patient was placed on BiPAP temporarily and is now resolved, her BiPAP was discontinued.  Opioid overdose: She became lethargic on 10/03/2019 Dilaudid was discontinued, now she is on oxycodone and Tylenol as needed.  Orthostatic hypotension: Likely due to decreased oral intake she was started on IV fluids slowly improving.  Anasarca: Likely due to hypoalbuminemia in the setting of poor nutritional status.  Chronic respiratory failure with hypoxia: Due to COPD continue current home oxygen.  Essential hypertension Continue to hold antihypertensive medication   Protein caloric malnutrition Estimated body mass index is 18.51 kg/m as calculated from the following:   Height as of this  encounter: 4' 11.02" (1.499 m).   Weight as of this encounter: 41.6 kg. Malnutrition Type:  Nutrition Problem: Severe Malnutrition Etiology: chronic illness(COPD/chronic respiratory failure with hypoxia on 2 L O2 at baseline, severe esophagitis, esophageal stenosis)   Malnutrition Characteristics:  Signs/Symptoms: moderate fat depletion, severe fat depletion, moderate muscle depletion, severe muscle depletion, percent weight loss Percent weight loss: 14 %   Nutrition Interventions:  Interventions: Ensure Enlive (each supplement provides 350kcal and 20 grams of protein), Magic cup, TPN  DVT prophylaxis: lovenox Family Communication: Daughter Disposition Plan/Barrier to D/C: Home with home health therapy, when she is proving to she gets tolerated solid diet she is currently on liquid diet.  Code Status:     Code Status Orders  (From admission, onward)         Start     Ordered   10/02/19 1817  Full code  Continuous     10/02/19 1816        Code Status History    Date Active Date Inactive Code Status Order ID Comments User Context   10/01/2019 2157 10/02/2019 1816 DNR [id number]  Lenore Cordia, MD ED   09/09/2019 2119 09/19/2019 1850 Full Code [id number]  Nita Sells, MD ED   06/29/2019 0947 07/01/2019 2116 Full Code [id number]  Reubin Milan, MD ED   04/02/2019 2138 04/07/2019 1856 Full Code [id number]  Jani Gravel, MD ED   06/15/2018 2152 06/18/2018 2205 Full Code [id number]  Rise Patience, MD ED   Advance Care Planning Activity    Advance Directive Documentation     Most Recent Value  Type of Advance Directive  Living will, Healthcare Power of Attorney  Pre-existing out of facility DNR order (yellow form or pink MOST  form)  -  "MOST" Form in Place?  -        IV Access:    Peripheral IV   Procedures and diagnostic studies:   No results found.   Medical Consultants:    None.  Anti-Infectives:   IV Diflucan  Subjective:    Claudia Wiley on tolerating her clear liquid diet.  Objective:    Vitals:   10/07/19 0500 10/07/19 0513 10/07/19 0804 10/07/19 0807  BP:  112/66    Pulse:  90    Resp:  19    Temp:  98.1 F (36.7 C)    TempSrc:  Oral    SpO2:  97% 96% 96%  Weight: 41.6 kg     Height:       SpO2: 96 % O2 Flow Rate (L/min): 2 L/min FiO2 (%): 30 %   Intake/Output Summary (Last 24 hours) at 10/07/2019 0958 Last data filed at 10/07/2019 0549 Gross per 24 hour  Intake 890.82 ml  Output 900 ml  Net -9.18 ml   Filed Weights   10/05/19 0500 10/06/19 0447 10/07/19 0500  Weight: 39 kg 39.9 kg 41.6 kg    Exam: General exam: In no acute distress. Respiratory system: Good air movement and clear to auscultation. Cardiovascular system: S1 & S2 heard, RRR. No JVD. Gastrointestinal system: Abdomen is nondistended, soft and nontender.  Extremities: No pedal edema. Skin: No rashes, lesions or ulcers Psychiatry: Judgement and insight appear appear improved today.  Data Reviewed:    Labs: Basic Metabolic Panel: Recent Labs  Lab 10/04/19 0455 10/04/19 0455 10/05/19 0415 10/05/19 0415 10/05/19 1905 10/05/19 1905 10/06/19 0405 10/07/19 0415  NA 142  --  145  --  143  --  142 141  K 4.2   < > 2.7*   < > 3.0*   < > 4.1 4.2  CL 107  --  106  --  104  --  104 101  CO2 24  --  33*  --  34*  --  34* 35*  GLUCOSE 81  --  135*  --  169*  --  170* 107*  BUN <5*  --  <5*  --  <5*  --  7* 9  CREATININE 0.37*  --  <0.30*  --  <0.30*  --  0.34* <0.30*  CALCIUM 8.1*  --  7.7*  --  7.5*  --  7.8* 8.4*  MG 1.8  --  1.6*  --  1.7  --  2.1 1.6*  PHOS 1.7*  --  1.3*  --  2.3*  --  3.5 3.9   < > = values in this interval not displayed.   GFR CrCl cannot be calculated (This lab value cannot be used to calculate CrCl because it is not a number: <0.30). Liver Function Tests: Recent Labs  Lab 10/01/19 1644 10/03/19 1305 10/04/19 0455 10/07/19 0415  AST 26 21 19 18   ALT 26 21 17 14   ALKPHOS 85 68 68 57  BILITOT  0.7 0.7 0.8 0.1*  PROT 6.8 5.2* 5.0* 4.5*  ALBUMIN 3.8 2.8* 2.6* 2.2*   Recent Labs  Lab 10/01/19 1644  LIPASE 21   No results for input(s): AMMONIA in the last 168 hours. Coagulation profile No results for input(s): INR, PROTIME in the last 168 hours. COVID-19 Labs  No results for input(s): DDIMER, FERRITIN, LDH, CRP in the last 72 hours.  Lab Results  Component Value Date   SARSCOV2NAA NEGATIVE 10/01/2019   SARSCOV2NAA NEGATIVE  09/09/2019   [place] NEGATIVE 06/29/2019   [place] NEGATIVE 04/02/2019    CBC: Recent Labs  Lab 10/01/19 1644 10/04/19 0455 10/06/19 0405  WBC 9.5 16.1* 5.8  NEUTROABS 4.4 10.9*  --   HGB 13.1 10.2* 8.9*  HCT 42.0 35.5* 28.7*  MCV 92.9 100.6* 94.4  PLT 528* 284 221   Cardiac Enzymes: No results for input(s): CKTOTAL, CKMB, CKMBINDEX, TROPONINI in the last 168 hours. BNP (last 3 results) No results for input(s): PROBNP in the last 8760 hours. CBG: Recent Labs  Lab 10/06/19 1237 10/06/19 1757 10/06/19 2312 10/07/19 0523 10/07/19 0722  GLUCAP 129* 95 124* 109* 105*   D-Dimer: No results for input(s): DDIMER in the last 72 hours. Hgb A1c: No results for input(s): HGBA1C in the last 72 hours. Lipid Profile: No results for input(s): CHOL, HDL, LDLCALC, TRIG, CHOLHDL, LDLDIRECT in the last 72 hours. Thyroid function studies: No results for input(s): TSH, T4TOTAL, T3FREE, THYROIDAB in the last 72 hours.  Invalid input(s): FREET3 Anemia work up: No results for input(s): VITAMINB12, FOLATE, FERRITIN, TIBC, IRON, RETICCTPCT in the last 72 hours. Sepsis Labs: Recent Labs  Lab 10/01/19 1644 10/04/19 0455 10/06/19 0405  WBC 9.5 16.1* 5.8   Microbiology Recent Results (from the past 240 hour(s))  SARS CORONAVIRUS 2 (TAT 6-24 HRS) Nasopharyngeal Nasopharyngeal Swab     Status: None   Collection Time: 10/01/19  9:58 PM   Specimen: Nasopharyngeal Swab  Result Value Ref Range Status   SARS Coronavirus 2 NEGATIVE NEGATIVE  Final    Comment: (NOTE) SARS-CoV-2 target nucleic acids are NOT DETECTED. The SARS-CoV-2 RNA is generally detectable in upper and lower respiratory specimens during the acute phase of infection. Negative results do not preclude SARS-CoV-2 infection, do not rule out co-infections with other pathogens, and should not be used as the sole basis for treatment or other patient management decisions. Negative results must be combined with clinical observations, patient history, and epidemiological information. The expected result is Negative. Fact Sheet for Patients: SugarRoll.be Fact Sheet for Healthcare Providers: https://www.woods-mathews.com/ This test is not yet approved or cleared by the [place] FDA and  has been authorized for detection and/or diagnosis of SARS-CoV-2 by FDA under an Emergency Use Authorization (EUA). This EUA will remain  in effect (meaning this test can be used) for the duration of the COVID-19 declaration under Section 56 4(b)(1) of the Act, 21 U.S.C. section 360bbb-3(b)(1), unless the authorization is terminated or revoked sooner. Performed at [facility], [place] [address] [place], [place] [address]      Medications:   . Chlorhexidine Gluconate Cloth  6 each Topical Daily  . enoxaparin (LOVENOX) injection  30 mg Subcutaneous Daily  . feeding supplement (ENSURE ENLIVE)  237 mL Oral QID  . insulin aspart  0-9 Units Subcutaneous Q6H  . mouth rinse  15 mL Mouth Rinse BID  . metoCLOPramide (REGLAN) injection  5 mg Intravenous TID PC & HS  . mometasone-formoterol  2 puff Inhalation BID  . pantoprazole (PROTONIX) IV  40 mg Intravenous Q12H  . sodium chloride flush  10-40 mL Intracatheter Q12H  . sodium chloride flush  3 mL Intravenous Q12H  . sucralfate  1 g Oral TID WC & HS  . umeclidinium bromide  1 puff Inhalation Daily   Continuous Infusions: . sodium chloride 45 mL/hr at 10/06/19 1935  . fluconazole  (DIFLUCAN) IV 200 mg (10/06/19 2125)  . magnesium sulfate bolus IVPB 4 g (10/07/19 0924)  . TPN ADULT (ION)  30 mL/hr at 10/06/19 1824  . TPN ADULT (ION)        LOS: 5 days   Charlynne Cousins  [facility]  10/07/2019, 9:58 AM

## 2019-10-08 LAB — BASIC METABOLIC PANEL
Anion gap: 4 — ABNORMAL LOW (ref 5–15)
BUN: 7 mg/dL — ABNORMAL LOW (ref 8–23)
CO2: 40 mmol/L — ABNORMAL HIGH (ref 22–32)
Calcium: 9.1 mg/dL (ref 8.9–10.3)
Chloride: 98 mmol/L (ref 98–111)
Creatinine, Ser: 0.33 mg/dL — ABNORMAL LOW (ref 0.44–1.00)
GFR calc Af Amer: >60 mL/min (ref >60)
GFR calc non Af Amer: >60 mL/min (ref >60)
Glucose, Bld: 107 mg/dL — ABNORMAL HIGH (ref 70–99)
Potassium: 4.7 mmol/L (ref 3.5–5.1)
Sodium: 142 mmol/L (ref 135–145)

## 2019-10-08 LAB — PHOSPHORUS: Phosphorus: 4.9 mg/dL — ABNORMAL HIGH (ref 2.5–4.6)

## 2019-10-08 LAB — GLUCOSE, CAPILLARY
Glucose-Capillary: 104 mg/dL — ABNORMAL HIGH (ref 70–99)
Glucose-Capillary: 111 mg/dL — ABNORMAL HIGH (ref 70–99)
Glucose-Capillary: 128 mg/dL — ABNORMAL HIGH (ref 70–99)

## 2019-10-08 LAB — MAGNESIUM: Magnesium: 1.5 mg/dL — ABNORMAL LOW (ref 1.7–2.4)

## 2019-10-08 MED ORDER — SODIUM CHLORIDE 0.9 % IV BOLUS
1000.0000 mL | Freq: Once | INTRAVENOUS | Status: AC
  Administered 2019-10-08: 1000 mL via INTRAVENOUS

## 2019-10-08 MED ORDER — DIPHENHYDRAMINE HCL 25 MG PO CAPS: 25.0000 mg | ORAL_CAPSULE | Freq: Three times a day (TID) | ORAL | Status: DC | PRN

## 2019-10-08 MED ORDER — SODIUM CHLORIDE 0.9 % IV SOLN: INTRAVENOUS | Status: AC

## 2019-10-08 MED ORDER — TRAVASOL 10 % IV SOLN
INTRAVENOUS | Status: AC
  Filled 2019-10-08: qty 403.2

## 2019-10-08 MED ORDER — DIPHENHYDRAMINE HCL 12.5 MG/5ML PO ELIX
25.0000 mg | ORAL_SOLUTION | Freq: Three times a day (TID) | ORAL | Status: DC | PRN
  Administered 2019-10-08 – 2019-10-13 (×2): 25 mg via ORAL
  Filled 2019-10-08 (×2): qty 10

## 2019-10-08 MED ORDER — MAGNESIUM SULFATE 4 GM/100ML IV SOLN
4.0000 g | Freq: Once | INTRAVENOUS | Status: AC
  Administered 2019-10-08: 4 g via INTRAVENOUS
  Filled 2019-10-08: qty 100

## 2019-10-08 NOTE — Progress Notes (Signed)
PHARMACY - TOTAL PARENTERAL NUTRITION CONSULT NOTE   Indication: Intolerance to enteral feeding  Patient Measurements: Height: 4' 11.02" (149.9 cm) Weight: 91 lb 11.4 oz (41.6 kg) IBW/kg (Calculated) : 43.24 TPN AdjBW (KG): 34.4 Body mass index is 18.51 kg/m. Usual Weight:   Assessment: [name] with hx esophageal stricture (s/p dilation in July 2019) who was hospitalized from 2/25-3/7 for severe esophagitis and odynophagia as well as duodenal ulcers.  She presented back to the ED on 3/19 with c/o SOB and esophageal pain. EGD on 3/21 showed severe esophagitis with ulceration and esophageal stenosis. GI team recommends to avoid "NG tube placement given recent esophageal injury" and  start TPN for nutrition.  Glucose / Insulin: no hx DM - CBGs 105-128 on TPN at 30 ml/hr, 1 U SSI given  Electrolytes: Na, K wnl, corrCa 10.5, Phos 4.9 ; Mag 1.5 (goal>2) Renal: SCr WNL LFTs / TGs: LFTs wnl, Trig 103 (3/22) Prealbumin / albumin:  - albumin low 2.2, prealbumin low 8.5 (3/22) Intake / Output; MIVF:  - NS at 45 ml/hr Imaging: Chest CT 3/21: mild, diffuse esophageal wall thickening, prob esophagitis, no free air, small R pleural effusion, mod emphysematous changes Surgeries / Procedures:  - 3/21 EGD: LA grade D esophagitis with contact bleeding from incisors. Scattered plaques consistent with candidiasis  Central access:  PICC placed 3/22 for TPN TPN start date: 3/22   Nutritional Goals (per RD recommendation on 3/20): Kcal:  1450-1750 Protein:  73-88 Fluid:  >/= 1.4 L/day  Goal TPN rate is 60 mL/hr (provides 81 g of protein and 1636 kcals per day)  Current Nutrition:  - Full liquid diet - Ensure ordered QID no more than 2 daily - refused all 3/25, added Magic Cups to trays - TPN  Plan: repeat Magnesium 4gm IV x1 now  At 1800:  Continue TPN at 30 mL/hr at 1800-will not advance due to refeeding encouraging po intake  - Electrolytes in TPN: decr to 82mEq/L - Na, decr  to 73mEq/L - K, decr to 52mEq/L - Ca, inc to 67mEq/L - Mg, decr to 53mmol/L - Phos. -  Cl:Ac ratio 1:1  - Add trace elements to TPN; MVI on MWF only due to national backorder - continue Sensitive q6h SSI and adjust as needed  - continue MIVF at 45 mL/hr or per MD - Full TPN labs on Mon/Thurs - BMET, Mag & Phos in am 3/27  Minda Ditto PharmD Pager [phone] 10/08/2019, 11:45 AM

## 2019-10-08 NOTE — Evaluation (Signed)
Physical Therapy Evaluation Patient Details Name: Claudia Wiley MRN: [id number] DOB: December 29, 1955 Today's Date: 10/08/2019   History of Present Illness  64 y.o. female past medical history of COPD on home O2 history of lung cancer status post left upper lobe lobectomy, gastrinoma status post Whipple procedure esophageal stricture and dilation in July 2019 with a recent admission for severe Candida esophagitis and esophageal necrosis presents secondary to dehydration in the setting of recurrent emesis and decreased oral intake secondary to odynophagia  Clinical Impression  Pt admitted with above diagnosis.  Pt is motivated to amb with PT. Trial of RA sitting EOB and sats decr to 77%, replaced O2 for rest of session and SpO2 94-100% on 2L. Encouraged pursed lip breathing and trunk extension. Pt will benefit from HHPT and continue PT in acute setting  Pt currently with functional limitations due to the deficits listed below (see PT Problem List). Pt will benefit from skilled PT to increase their independence and safety with mobility to allow discharge to the venue listed below.       Follow Up Recommendations Home health PT;Supervision for mobility/OOB    Equipment Recommendations  None recommended by PT    Recommendations for Other Services       Precautions / Restrictions Precautions Precautions: Fall Precaution Comments: monitor sats Restrictions Weight Bearing Restrictions: No      Mobility  Bed Mobility Overal bed mobility: Needs Assistance Bed Mobility: Supine to Sit     Supine to sit: HOB elevated;Min guard     General bed mobility comments: for lines and safety, incr time  Transfers Overall transfer level: Needs assistance Equipment used: Rolling walker (2 wheeled) Transfers: Sit to/from Stand Sit to Stand: Min guard         General transfer comment: cues for hand placement on initial stand  Ambulation/Gait Ambulation/Gait assistance: Min guard;Supervision Gait  Distance (Feet): 90 Feet Assistive device: Rolling walker (2 wheeled) Gait Pattern/deviations: Decreased stride length;Step-through pattern Gait velocity: decr   General Gait Details: assist to maneuver RW intermittently, pt uses rollator at home  Stairs            Wheelchair Mobility    Modified Rankin (Stroke Patients Only)       Balance   Sitting-balance support: No upper extremity supported;Feet supported Sitting balance-Leahy Scale: Good     Standing balance support: No upper extremity supported Standing balance-Leahy Scale: Fair Standing balance comment: reliant on RW for dynamic tasks                             Pertinent Vitals/Pain Pain Assessment: No/denies pain    Home Living Family/patient expects to be discharged to:: Private residence Living Arrangements: Children Available Help at Discharge: Family;Available PRN/intermittently Type of Home: Apartment Home Access: Stairs to enter Entrance Stairs-Rails: Right Entrance Stairs-Number of Steps: 3; right rail going up Home Layout: One level Home Equipment: Walker - 2 wheels;Shower seat;Wheelchair - manual;Cane - single point;Walker - 4 wheels Additional Comments: Pt likes to bathe in tub, requires assistance to get in/out of tub. Dtr works    Prior Production designer, theatre/television/film / Transfers Assistance Needed: uses rollator PRN  ADL's / Homemaking Assistance Needed: family assists with household tasks and A her with some of her bathing and dressing  Comments: daughter and granddaughter work in the evening     Hand Dominance        Extremity/Trunk Assessment   Upper Extremity  Assessment Upper Extremity Assessment: Generalized weakness    Lower Extremity Assessment Lower Extremity Assessment: Generalized weakness       Communication   Communication: No difficulties  Cognition Arousal/Alertness: Awake/alert Behavior During Therapy: WFL for tasks assessed/performed Overall Cognitive  Status: Within Functional Limits for tasks assessed                                        General Comments      Exercises     Assessment/Plan    PT Assessment Patient needs continued PT services  PT Problem List Decreased strength;Decreased activity tolerance;Decreased balance;Decreased mobility;Decreased knowledge of use of DME       PT Treatment Interventions DME instruction;Gait training;Stair training;Functional mobility training;Therapeutic activities;Therapeutic exercise;Balance training;Patient/family education    PT Goals (Current goals can be found in the Care Plan section)  Acute Rehab PT Goals Patient Stated Goal: agreeable to get up and walk, wants to get stronger PT Goal Formulation: With patient Time For Goal Achievement: 10/21/19 Potential to Achieve Goals: Good    Frequency Min 3X/week   Barriers to discharge        Co-evaluation               AM-PAC PT "6 Clicks" Mobility  Outcome Measure Help needed turning from your back to your side while in a flat bed without using bedrails?: None Help needed moving from lying on your back to sitting on the side of a flat bed without using bedrails?: A Little Help needed moving to and from a bed to a chair (including a wheelchair)?: A Little Help needed standing up from a chair using your arms (e.g., wheelchair or bedside chair)?: A Little Help needed to walk in hospital room?: A Little Help needed climbing 3-5 steps with a railing? : A Little 6 Click Score: 19    End of Session Equipment Utilized During Treatment: Oxygen;Gait belt Activity Tolerance: Patient tolerated treatment well Patient left: in bed;with call bell/phone within reach   PT Visit Diagnosis: Unsteadiness on feet (R26.81);Difficulty in walking, not elsewhere classified (R26.2)    Time: [id number] PT Time Calculation (min) (ACUTE ONLY): 30 min   Charges:   PT Evaluation $PT Eval Low Complexity: 1 Low PT  Treatments $Gait Training: 8-22 mins        Baxter Flattery, PT   Acute Rehab Dept [facility]): [id number]   10/08/2019   [facility] 10/08/2019, 3:45 PM

## 2019-10-08 NOTE — Progress Notes (Signed)
[facility] PROGRESS NOTE    Progress Note  Claudia Wiley  [id number] DOB: 10-25-1955 DOA: 10/01/2019 PCP: [facility]     Brief Narrative:   Claudia Wiley is an 64 y.o. female past medical history of COPD on home O2 history of lung cancer status post left upper lobe lobectomy, gastrinoma status post Whipple procedure esophageal stricture and dilation in July 2019 with a recent admission for severe Candida esophagitis and esophageal necrosis presents secondary to dehydration in the setting of recurrent emesis and decreased oral intake secondary to odynophagia  Assessment/Plan:   Candida esophagitis due to odynophagia: Patient was previously treated with a full course of fluconazole as an outpatient. On admission she was started on admission on IV fluconazole GI was consulted to perform an EGD on 10/03/2019 showed severe esophagitis with ulceration with bleeding, some esophageal stenosis developing. Continue TPN patient is tolerating full diet minimally. GI also recommended to continue Carafate and IV Protonix twice daily she was also started on Phenergan. He is now tolerating her clear liquid diet she is getting dehydrated, will start her on IV fluids.  Acute respiratory failure with hypercapnia: Patient was placed on BiPAP temporarily and is now resolved, her BiPAP was discontinued.  Opioid overdose: She became lethargic on 10/03/2019 Dilaudid was discontinued, now she is on oxycodone and Tylenol as needed.  Orthostatic hypotension: Now resolved, but she is not eating or taking oral fluid intake we will start her on IV fluids.  Anasarca: Likely due to hypoalbuminemia in the setting of poor nutritional status.  Chronic respiratory failure with hypoxia: Due to COPD continue current home oxygen.  Essential hypertension Continue to hold antihypertensive medication   Protein caloric malnutrition Estimated body mass index is 17.89 kg/m as calculated from the  following:   Height as of this encounter: 4' 11.02" (1.499 m).   Weight as of this encounter: 40.2 kg. Malnutrition Type:  Nutrition Problem: Severe Malnutrition Etiology: chronic illness(COPD/chronic respiratory failure with hypoxia on 2 L O2 at baseline, severe esophagitis, esophageal stenosis)   Malnutrition Characteristics:  Signs/Symptoms: moderate fat depletion, severe fat depletion, moderate muscle depletion, severe muscle depletion, percent weight loss Percent weight loss: 14 %   Nutrition Interventions:  Interventions: Ensure Enlive (each supplement provides 350kcal and 20 grams of protein), Magic cup, TPN  DVT prophylaxis: lovenox Family Communication: Daughter Disposition Plan/Barrier to D/C: Home with home health therapy, when she is proving to she gets tolerated solid diet.  Code Status:     Code Status Orders  (From admission, onward)         Start     Ordered   10/02/19 1817  Full code  Continuous     10/02/19 1816        Code Status History    Date Active Date Inactive Code Status Order ID Comments User Context   10/01/2019 2157 10/02/2019 1816 DNR [id number]  Claudia Cordia, MD ED   09/09/2019 2119 09/19/2019 1850 Full Code [id number]  Claudia Sells, MD ED   06/29/2019 0947 07/01/2019 2116 Full Code [id number]  Claudia Milan, MD ED   04/02/2019 2138 04/07/2019 1856 Full Code [id number]  Claudia Gravel, MD ED   06/15/2018 2152 06/18/2018 2205 Full Code [id number]  Claudia Patience, MD ED   Advance Care Planning Activity    Advance Directive Documentation     Most Recent Value  Type of Advance Directive  Living will, Healthcare Power of Attorney  Pre-existing out of facility DNR order (yellow form or  pink MOST form)  --  "MOST" Form in Place?  --        IV Access:    Peripheral IV   Procedures and diagnostic studies:   No results found.   Medical Consultants:    None.  Anti-Infectives:   IV Diflucan  Subjective:    Claudia Wiley not tolerating her clear liquid diet.  Objective:    Vitals:   10/08/19 0643 10/08/19 0645 10/08/19 0832 10/08/19 0835  BP: (!) 117/58     Pulse: 89     Resp: 18     Temp: 98.2 F (36.8 C)     TempSrc: Oral     SpO2: 97%  95% 95%  Weight:  40.2 kg    Height:       SpO2: 95 % O2 Flow Rate (L/min): 2 L/min FiO2 (%): 30 %   Intake/Output Summary (Last 24 hours) at 10/08/2019 1004 Last data filed at 10/08/2019 0600 Gross per 24 hour  Intake 901.76 ml  Output 500 ml  Net 401.76 ml   Filed Weights   10/06/19 0447 10/07/19 0500 10/08/19 0645  Weight: 39.9 kg 41.6 kg 40.2 kg    Exam: General exam: In no acute distress, cachectic Respiratory system: Good air movement and clear to auscultation. Cardiovascular system: S1 & S2 heard, RRR. No JVD. Gastrointestinal system: Abdomen is nondistended, soft and nontender.  Extremities: No pedal edema. Skin: No rashes, lesions or ulcers  Data Reviewed:    Labs: Basic Metabolic Panel: Recent Labs  Lab 10/05/19 0415 10/05/19 0415 10/05/19 1905 10/05/19 1905 10/06/19 0405 10/06/19 0405 10/07/19 0415 10/08/19 0857  NA 145  --  143  --  142  --  141 142  K 2.7*   < > 3.0*   < > 4.1   < > 4.2 4.7  CL 106  --  104  --  104  --  101 98  CO2 33*  --  34*  --  34*  --  35* 40*  GLUCOSE 135*  --  169*  --  170*  --  107* 107*  BUN <5*  --  <5*  --  7*  --  9 7*  CREATININE <0.30*  --  <0.30*  --  0.34*  --  <0.30* 0.33*  CALCIUM 7.7*  --  7.5*  --  7.8*  --  8.4* 9.1  MG 1.6*  --  1.7  --  2.1  --  1.6* 1.5*  PHOS 1.3*  --  2.3*  --  3.5  --  3.9 4.9*   < > = values in this interval not displayed.   GFR Estimated Creatinine Clearance: 46.9 mL/min (A) (by C-G formula based on SCr of 0.33 mg/dL (L)). Liver Function Tests: Recent Labs  Lab 10/01/19 1644 10/03/19 1305 10/04/19 0455 10/07/19 0415  AST 26 21 19 18   ALT 26 21 17 14   ALKPHOS 85 68 68 57  BILITOT 0.7 0.7 0.8 0.1*  PROT 6.8 5.2* 5.0* 4.5*  ALBUMIN 3.8  2.8* 2.6* 2.2*   Recent Labs  Lab 10/01/19 1644  LIPASE 21   No results for input(s): AMMONIA in the last 168 hours. Coagulation profile No results for input(s): INR, PROTIME in the last 168 hours. COVID-19 Labs  No results for input(s): DDIMER, FERRITIN, LDH, CRP in the last 72 hours.  Lab Results  Component Value Date   SARSCOV2NAA NEGATIVE 10/01/2019   [place] NEGATIVE 09/09/2019   [place] NEGATIVE 06/29/2019  [place] NEGATIVE 04/02/2019    CBC: Recent Labs  Lab 10/01/19 1644 10/04/19 0455 10/06/19 0405  WBC 9.5 16.1* 5.8  NEUTROABS 4.4 10.9*  --   HGB 13.1 10.2* 8.9*  HCT 42.0 35.5* 28.7*  MCV 92.9 100.6* 94.4  PLT 528* 284 221   Cardiac Enzymes: No results for input(s): CKTOTAL, CKMB, CKMBINDEX, TROPONINI in the last 168 hours. BNP (last 3 results) No results for input(s): PROBNP in the last 8760 hours. CBG: Recent Labs  Lab 10/07/19 0722 10/07/19 1155 10/07/19 1656 10/08/19 0014 10/08/19 0516  GLUCAP 105* 112* 105* 111* 128*   D-Dimer: No results for input(s): DDIMER in the last 72 hours. Hgb A1c: No results for input(s): HGBA1C in the last 72 hours. Lipid Profile: No results for input(s): CHOL, HDL, LDLCALC, TRIG, CHOLHDL, LDLDIRECT in the last 72 hours. Thyroid function studies: No results for input(s): TSH, T4TOTAL, T3FREE, THYROIDAB in the last 72 hours.  Invalid input(s): FREET3 Anemia work up: No results for input(s): VITAMINB12, FOLATE, FERRITIN, TIBC, IRON, RETICCTPCT in the last 72 hours. Sepsis Labs: Recent Labs  Lab 10/01/19 1644 10/04/19 0455 10/06/19 0405  WBC 9.5 16.1* 5.8   Microbiology Recent Results (from the past 240 hour(s))  SARS CORONAVIRUS 2 (TAT 6-24 HRS) Nasopharyngeal Nasopharyngeal Swab     Status: None   Collection Time: 10/01/19  9:58 PM   Specimen: Nasopharyngeal Swab  Result Value Ref Range Status   SARS Coronavirus 2 NEGATIVE NEGATIVE Final    Comment: (NOTE) SARS-CoV-2 target nucleic acids  are NOT DETECTED. The SARS-CoV-2 RNA is generally detectable in upper and lower respiratory specimens during the acute phase of infection. Negative results do not preclude SARS-CoV-2 infection, do not rule out co-infections with other pathogens, and should not be used as the sole basis for treatment or other patient management decisions. Negative results must be combined with clinical observations, patient history, and epidemiological information. The expected result is Negative. Fact Sheet for Patients: SugarRoll.be Fact Sheet for Healthcare Providers: https://www.woods-mathews.com/ This test is not yet approved or cleared by the [place] FDA and  has been authorized for detection and/or diagnosis of SARS-CoV-2 by FDA under an Emergency Use Authorization (EUA). This EUA will remain  in effect (meaning this test can be used) for the duration of the COVID-19 declaration under Section 56 4(b)(1) of the Act, 21 U.S.C. section 360bbb-3(b)(1), unless the authorization is terminated or revoked sooner. Performed at [facility], [place] [address], [place], [place] [address]      Medications:   . Chlorhexidine Gluconate Cloth  6 each Topical Daily  . enoxaparin (LOVENOX) injection  30 mg Subcutaneous Daily  . feeding supplement (ENSURE ENLIVE)  237 mL Oral QID  . insulin aspart  0-9 Units Subcutaneous Q6H  . mouth rinse  15 mL Mouth Rinse BID  . metoCLOPramide (REGLAN) injection  5 mg Intravenous TID PC & HS  . mometasone-formoterol  2 puff Inhalation BID  . pantoprazole (PROTONIX) IV  40 mg Intravenous Q12H  . sodium chloride flush  10-40 mL Intracatheter Q12H  . sodium chloride flush  3 mL Intravenous Q12H  . sucralfate  1 g Oral TID WC & HS  . umeclidinium bromide  1 puff Inhalation Daily   Continuous Infusions: . sodium chloride 10 mL/hr at 10/07/19 1102  . fluconazole (DIFLUCAN) IV 200 mg (10/07/19 2234)  . TPN ADULT (ION) 30  mL/hr at 10/07/19 1722      LOS: 6 days   Charlynne Cousins  [facility]  10/08/2019, 10:04 AM

## 2019-10-09 LAB — BASIC METABOLIC PANEL
Anion gap: 5 (ref 5–15)
BUN: 6 mg/dL — ABNORMAL LOW (ref 8–23)
CO2: 38 mmol/L — ABNORMAL HIGH (ref 22–32)
Calcium: 8.8 mg/dL — ABNORMAL LOW (ref 8.9–10.3)
Chloride: 97 mmol/L — ABNORMAL LOW (ref 98–111)
Creatinine, Ser: 0.31 mg/dL — ABNORMAL LOW (ref 0.44–1.00)
GFR calc Af Amer: >60 mL/min (ref >60)
GFR calc non Af Amer: >60 mL/min (ref >60)
Glucose, Bld: 113 mg/dL — ABNORMAL HIGH (ref 70–99)
Potassium: 4.8 mmol/L (ref 3.5–5.1)
Sodium: 140 mmol/L (ref 135–145)

## 2019-10-09 LAB — GLUCOSE, CAPILLARY
Glucose-Capillary: 101 mg/dL — ABNORMAL HIGH (ref 70–99)
Glucose-Capillary: 101 mg/dL — ABNORMAL HIGH (ref 70–99)
Glucose-Capillary: 103 mg/dL — ABNORMAL HIGH (ref 70–99)
Glucose-Capillary: 105 mg/dL — ABNORMAL HIGH (ref 70–99)
Glucose-Capillary: 110 mg/dL — ABNORMAL HIGH (ref 70–99)

## 2019-10-09 LAB — MAGNESIUM: Magnesium: 1.5 mg/dL — ABNORMAL LOW (ref 1.7–2.4)

## 2019-10-09 LAB — PHOSPHORUS: Phosphorus: 3.3 mg/dL (ref 2.5–4.6)

## 2019-10-09 MED ORDER — MAGNESIUM SULFATE 4 GM/100ML IV SOLN
4.0000 g | Freq: Once | INTRAVENOUS | Status: AC
  Administered 2019-10-09: 4 g via INTRAVENOUS
  Filled 2019-10-09: qty 100

## 2019-10-09 MED ORDER — TRAVASOL 10 % IV SOLN
INTRAVENOUS | Status: AC
  Filled 2019-10-09: qty 537.6

## 2019-10-09 NOTE — Progress Notes (Signed)
[facility] PROGRESS NOTE    Progress Note  Claudia Wiley  [id number] DOB: 1955-12-07 DOA: 10/01/2019 PCP: [facility]     Brief Narrative:   Claudia Wiley is an 64 y.o. female past medical history of COPD on home O2 history of lung cancer status post left upper lobe lobectomy, gastrinoma status post Whipple procedure esophageal stricture and dilation in July 2019 with a recent admission for severe Candida esophagitis and esophageal necrosis presents secondary to dehydration in the setting of recurrent emesis and decreased oral intake secondary to odynophagia  Assessment/Plan:   Candida esophagitis due to odynophagia: Patient was previously treated with a full course of fluconazole as an outpatient. On admission she was started on admission on IV fluconazole GI was consulted to perform an EGD on 10/03/2019 showed severe esophagitis with ulceration with bleeding, some esophageal stenosis developing. Continue TPN. She relates he is able to tolerate her full liquid diet but she has tolerated 0% of her last 2 meals. Continue Carafate and IV Protonix twice a day Phenergan for nausea. Her bicarbonates continues to increase we will increase her IV fluids.  Acute respiratory failure with hypercapnia: Patient was placed on BiPAP temporarily and is now resolved, her BiPAP was discontinued.  Opioid overdose: She became lethargic on 10/03/2019 Dilaudid was discontinued, now she is on oxycodone and Tylenol as needed.  Orthostatic hypotension: Now resolved, she continues to drink minimally, will restart her on IV fluids.  Anasarca: Likely due to hypoalbuminemia in the setting of poor nutritional status.  Chronic respiratory failure with hypoxia: Due to COPD continue current home oxygen.  Essential hypertension Continue to hold antihypertensive medication   Protein caloric malnutrition Estimated body mass index is 18.29 kg/m as calculated from the following:   Height as  of this encounter: 4' 11.02" (1.499 m).   Weight as of this encounter: 41.1 kg. Malnutrition Type:  Nutrition Problem: Severe Malnutrition Etiology: chronic illness(COPD/chronic respiratory failure with hypoxia on 2 L O2 at baseline, severe esophagitis, esophageal stenosis)   Malnutrition Characteristics:  Signs/Symptoms: moderate fat depletion, severe fat depletion, moderate muscle depletion, severe muscle depletion, percent weight loss Percent weight loss: 14 %   Nutrition Interventions:  Interventions: Ensure Enlive (each supplement provides 350kcal and 20 grams of protein), Magic cup, TPN  DVT prophylaxis: lovenox Family Communication: Daughter Disposition Plan/Barrier to D/C: Home with home health therapy once she is tolerating her diet.  Code Status:     Code Status Orders  (From admission, onward)         Start     Ordered   10/02/19 1817  Full code  Continuous     10/02/19 1816        Code Status History    Date Active Date Inactive Code Status Order ID Comments User Context   10/01/2019 2157 10/02/2019 1816 DNR [id number]  Lenore Cordia, MD ED   09/09/2019 2119 09/19/2019 1850 Full Code [id number]  Nita Sells, MD ED   06/29/2019 0947 07/01/2019 2116 Full Code [id number]  Reubin Milan, MD ED   04/02/2019 2138 04/07/2019 1856 Full Code [id number]  Jani Gravel, MD ED   06/15/2018 2152 06/18/2018 2205 Full Code [id number]  Rise Patience, MD ED   Advance Care Planning Activity    Advance Directive Documentation     Most Recent Value  Type of Advance Directive  Living will, Healthcare Power of Attorney  Pre-existing out of facility DNR order (yellow form or pink MOST form)  --  "MOST" Form  in Place?  --        IV Access:    Peripheral IV   Procedures and diagnostic studies:   No results found.   Medical Consultants:    None.  Anti-Infectives:   IV Diflucan  Subjective:    Claudia Wiley she relates she tolerated her Ensure this  morning.  I have told her that she needs to demonstrate that she is able to tolerate a diet before being discharged.  Objective:    Vitals:   10/08/19 2109 10/09/19 0636 10/09/19 0643 10/09/19 0754  BP: (!) 106/59 (!) 146/79    Pulse: 90 89    Resp: 20 18    Temp: 98.9 F (37.2 C) 98.7 F (37.1 C)    TempSrc: Oral Oral    SpO2: 97% 97%  95%  Weight:   41.1 kg   Height:       SpO2: 95 % O2 Flow Rate (L/min): 2 L/min FiO2 (%): 28 %   Intake/Output Summary (Last 24 hours) at 10/09/2019 0832 Last data filed at 10/09/2019 [id number] Gross per 24 hour  Intake 2436.95 ml  Output 2075 ml  Net 361.95 ml   Filed Weights   10/07/19 0500 10/08/19 0645 10/09/19 0643  Weight: 41.6 kg 40.2 kg 41.1 kg    Exam: General exam: In no acute distress, cachectic appearing Respiratory system: Good air movement and clear to auscultation. Cardiovascular system: S1 & S2 heard, RRR. No JVD. Gastrointestinal system: Abdomen is nondistended, soft and nontender.  Extremities: No pedal edema. Skin: No rashes, lesions or ulcers  Data Reviewed:    Labs: Basic Metabolic Panel: Recent Labs  Lab 10/05/19 0415 10/05/19 0415 10/05/19 1905 10/05/19 1905 10/06/19 0405 10/06/19 0405 10/07/19 0415 10/08/19 0857  NA 145  --  143  --  142  --  141 142  K 2.7*   < > 3.0*   < > 4.1   < > 4.2 4.7  CL 106  --  104  --  104  --  101 98  CO2 33*  --  34*  --  34*  --  35* 40*  GLUCOSE 135*  --  169*  --  170*  --  107* 107*  BUN <5*  --  <5*  --  7*  --  9 7*  CREATININE <0.30*  --  <0.30*  --  0.34*  --  <0.30* 0.33*  CALCIUM 7.7*  --  7.5*  --  7.8*  --  8.4* 9.1  MG 1.6*  --  1.7  --  2.1  --  1.6* 1.5*  PHOS 1.3*  --  2.3*  --  3.5  --  3.9 4.9*   < > = values in this interval not displayed.   GFR Estimated Creatinine Clearance: 47.9 mL/min (A) (by C-G formula based on SCr of 0.33 mg/dL (L)). Liver Function Tests: Recent Labs  Lab 10/03/19 1305 10/04/19 0455 10/07/19 0415  AST 21 19 18   ALT  21 17 14   ALKPHOS 68 68 57  BILITOT 0.7 0.8 0.1*  PROT 5.2* 5.0* 4.5*  ALBUMIN 2.8* 2.6* 2.2*   No results for input(s): LIPASE, AMYLASE in the last 168 hours. No results for input(s): AMMONIA in the last 168 hours. Coagulation profile No results for input(s): INR, PROTIME in the last 168 hours. COVID-19 Labs  No results for input(s): DDIMER, FERRITIN, LDH, CRP in the last 72 hours.  Lab Results  Component Value Date   [place] NEGATIVE 10/01/2019  [place] NEGATIVE 09/09/2019   [place] NEGATIVE 06/29/2019   [place] NEGATIVE 04/02/2019    CBC: Recent Labs  Lab 10/04/19 0455 10/06/19 0405  WBC 16.1* 5.8  NEUTROABS 10.9*  --   HGB 10.2* 8.9*  HCT 35.5* 28.7*  MCV 100.6* 94.4  PLT 284 221   Cardiac Enzymes: No results for input(s): CKTOTAL, CKMB, CKMBINDEX, TROPONINI in the last 168 hours. BNP (last 3 results) No results for input(s): PROBNP in the last 8760 hours. CBG: Recent Labs  Lab 10/08/19 0014 10/08/19 0516 10/08/19 1158 10/08/19 2359 10/09/19 0640  GLUCAP 111* 128* 104* 103* 101*   D-Dimer: No results for input(s): DDIMER in the last 72 hours. Hgb A1c: No results for input(s): HGBA1C in the last 72 hours. Lipid Profile: No results for input(s): CHOL, HDL, LDLCALC, TRIG, CHOLHDL, LDLDIRECT in the last 72 hours. Thyroid function studies: No results for input(s): TSH, T4TOTAL, T3FREE, THYROIDAB in the last 72 hours.  Invalid input(s): FREET3 Anemia work up: No results for input(s): VITAMINB12, FOLATE, FERRITIN, TIBC, IRON, RETICCTPCT in the last 72 hours. Sepsis Labs: Recent Labs  Lab 10/04/19 0455 10/06/19 0405  WBC 16.1* 5.8   Microbiology Recent Results (from the past 240 hour(s))  SARS CORONAVIRUS 2 (TAT 6-24 HRS) Nasopharyngeal Nasopharyngeal Swab     Status: None   Collection Time: 10/01/19  9:58 PM   Specimen: Nasopharyngeal Swab  Result Value Ref Range Status   SARS Coronavirus 2 NEGATIVE NEGATIVE Final    Comment:  (NOTE) SARS-CoV-2 target nucleic acids are NOT DETECTED. The SARS-CoV-2 RNA is generally detectable in upper and lower respiratory specimens during the acute phase of infection. Negative results do not preclude SARS-CoV-2 infection, do not rule out co-infections with other pathogens, and should not be used as the sole basis for treatment or other patient management decisions. Negative results must be combined with clinical observations, patient history, and epidemiological information. The expected result is Negative. Fact Sheet for Patients: SugarRoll.be Fact Sheet for Healthcare Providers: https://www.woods-mathews.com/ This test is not yet approved or cleared by the [place] FDA and  has been authorized for detection and/or diagnosis of SARS-CoV-2 by FDA under an Emergency Use Authorization (EUA). This EUA will remain  in effect (meaning this test can be used) for the duration of the COVID-19 declaration under Section 56 4(b)(1) of the Act, 21 U.S.C. section 360bbb-3(b)(1), unless the authorization is terminated or revoked sooner. Performed at [facility], [place] [address] [place], [place] [address]      Medications:   . Chlorhexidine Gluconate Cloth  6 each Topical Daily  . enoxaparin (LOVENOX) injection  30 mg Subcutaneous Daily  . feeding supplement (ENSURE ENLIVE)  237 mL Oral QID  . insulin aspart  0-9 Units Subcutaneous Q6H  . mouth rinse  15 mL Mouth Rinse BID  . metoCLOPramide (REGLAN) injection  5 mg Intravenous TID PC & HS  . mometasone-formoterol  2 puff Inhalation BID  . pantoprazole (PROTONIX) IV  40 mg Intravenous Q12H  . sodium chloride flush  10-40 mL Intracatheter Q12H  . sodium chloride flush  3 mL Intravenous Q12H  . sucralfate  1 g Oral TID WC & HS  . umeclidinium bromide  1 puff Inhalation Daily   Continuous Infusions: . sodium chloride 10 mL/hr at 10/07/19 1102  . sodium chloride 75 mL/hr at  10/09/19 0504  . fluconazole (DIFLUCAN) IV 200 mg (10/08/19 2134)  . TPN ADULT (ION) 30 mL/hr at 10/08/19 1705  LOS: 7 days   Charlynne Cousins  [facility]  10/09/2019, 8:32 AM

## 2019-10-09 NOTE — Progress Notes (Signed)
PHARMACY - TOTAL PARENTERAL NUTRITION CONSULT NOTE   Indication: Intolerance to enteral feeding  Patient Measurements: Height: 4' 11.02" (149.9 cm) Weight: 90 lb 9.7 oz (41.1 kg) IBW/kg (Calculated) : 43.24 TPN AdjBW (KG): 34.4 Body mass index is 18.29 kg/m. Usual Weight:   Assessment: [name] with hx esophageal stricture (s/p dilation in July 2019) who was hospitalized from 2/25-3/7 for severe esophagitis and odynophagia as well as duodenal ulcers.  She presented back to the ED on 3/19 with c/o SOB and esophageal pain. EGD on 3/21 showed severe esophagitis with ulceration and esophageal stenosis. GI team recommends to avoid "NG tube placement given recent esophageal injury" and  start TPN for nutrition.  Glucose / Insulin: no hx DM - CBGs 10-113 on TPN at 30 ml/hr, 0 units insulin given  Electrolytes: Na, K, Phos wnl, corrCa 10.2, Mag 1.5 (goal>2), CL low, CO2 remains elevated Renal: SCr WNL LFTs / TGs: LFTs wnl, Trig 103 (3/22) Prealbumin / albumin:  - albumin low 2.2, prealbumin low 8.5 (3/22) Intake / Output; MIVF:  - NS at 10 ml/hr Imaging: Chest CT 3/21: mild, diffuse esophageal wall thickening, prob esophagitis, no free air, small R pleural effusion, mod emphysematous changes Surgeries / Procedures:  - 3/21 EGD: LA grade D esophagitis with contact bleeding from incisors. Scattered plaques consistent with candidiasis  Central access:  PICC placed 3/22 for TPN TPN start date: 3/22   Nutritional Goals (per RD recommendation on 3/20): Kcal:  1450-1750 Protein:  73-88 Fluid:  >/= 1.4 L/day  Goal TPN rate is 60 mL/hr (provides 81 g of protein and 1636 kcals per day)  Current Nutrition:  - Full liquid diet - Ensure ordered QID no more than 2 daily - refused all 3/25, added Magic Cups to trays - TPN  Plan: repeat Magnesium 4gm IV x1 now  At 1800:  Increase TPN to40 mL/hr at 1800  - Electrolytes in TPN: 61mEq/L - Na, decr to 75mEq/L - K, 77mEq/L - Ca, inc  to 15 mEq/L - Mg, 47mmol/L - Phos. - Cl:Ac ratio will max Chloride (TPN program will use more Cl if we maximize, unable to calc 2:1) - Add trace elements to TPN; MVI on MWF only due to national backorder - continue Sensitive q6h SSI and adjust as needed  - continue MIVF at 10 ml/hr per MD - Full TPN labs on Mon/Thurs - BMET, Mag & Phos in am 3/28  Minda Ditto PharmD Pager [phone] 10/09/2019, 10:39 AM

## 2019-10-09 NOTE — Plan of Care (Signed)
  Problem: Clinical Measurements: Goal: Ability to maintain clinical measurements within normal limits will improve Outcome: Progressing   Problem: Clinical Measurements: Goal: Will remain free from infection Outcome: Progressing   Problem: Clinical Measurements: Goal: Diagnostic test results will improve Outcome: Progressing   Problem: Clinical Measurements: Goal: Respiratory complications will improve Outcome: Progressing   Problem: Activity: Goal: Risk for activity intolerance will decrease Outcome: Progressing   Problem: Nutrition: Goal: Adequate nutrition will be maintained Outcome: Progressing   Problem: Elimination: Goal: Will not experience complications related to bowel motility Outcome: Progressing   Problem: Safety: Goal: Ability to remain free from injury will improve Outcome: Progressing

## 2019-10-10 LAB — BASIC METABOLIC PANEL
Anion gap: 6 (ref 5–15)
BUN: 8 mg/dL (ref 8–23)
CO2: 32 mmol/L (ref 22–32)
Calcium: 8.9 mg/dL (ref 8.9–10.3)
Chloride: 103 mmol/L (ref 98–111)
Creatinine, Ser: 0.33 mg/dL — ABNORMAL LOW (ref 0.44–1.00)
GFR calc Af Amer: >60 mL/min (ref >60)
GFR calc non Af Amer: >60 mL/min (ref >60)
Glucose, Bld: 131 mg/dL — ABNORMAL HIGH (ref 70–99)
Potassium: 4.6 mmol/L (ref 3.5–5.1)
Sodium: 141 mmol/L (ref 135–145)

## 2019-10-10 LAB — GLUCOSE, CAPILLARY
Glucose-Capillary: 114 mg/dL — ABNORMAL HIGH (ref 70–99)
Glucose-Capillary: 118 mg/dL — ABNORMAL HIGH (ref 70–99)
Glucose-Capillary: 133 mg/dL — ABNORMAL HIGH (ref 70–99)
Glucose-Capillary: 133 mg/dL — ABNORMAL HIGH (ref 70–99)

## 2019-10-10 LAB — PHOSPHORUS: Phosphorus: 2.7 mg/dL (ref 2.5–4.6)

## 2019-10-10 LAB — MAGNESIUM: Magnesium: 1.6 mg/dL — ABNORMAL LOW (ref 1.7–2.4)

## 2019-10-10 MED ORDER — TRAVASOL 10 % IV SOLN
INTRAVENOUS | Status: AC
  Filled 2019-10-10: qty 537.6

## 2019-10-10 MED ORDER — MAGNESIUM SULFATE 4 GM/100ML IV SOLN
4.0000 g | Freq: Once | INTRAVENOUS | Status: AC
  Administered 2019-10-10: 4 g via INTRAVENOUS
  Filled 2019-10-10: qty 100

## 2019-10-10 NOTE — Progress Notes (Signed)
[facility] PROGRESS NOTE    Progress Note  Claudia Wiley  [id number] DOB: 12-26-1955 DOA: 10/01/2019 PCP: [facility]     Brief Narrative:   Claudia Wiley is an 64 y.o. 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  Assessment/Plan:   Candida esophagitis due to odynophagia: On admission she was started on admission on IV fluconazole GI was consulted to perform an EGD on 10/03/2019 showed severe esophagitis with ulceration with bleeding, some esophageal stenosis developing. She was started on TPN, she is eating only about 10% of her full liquid diet she relates 10/09/2018 1 at night she tolerated well 10% of her food and it was hurting in her epigastric area. She is is on IV Protonix, Phenergan and Carafate which we will continue.  Acute respiratory failure with hypercapnia: Patient was placed on BiPAP temporarily and is now resolved, her BiPAP was discontinued. Unclear etiology.  Opioid overdose: She became lethargic on 10/03/2019 Dilaudid was discontinued, now she is on oxycodone and Tylenol as needed.  Orthostatic hypotension: Orthostatics have resolved we will KVO IV fluids.  Anasarca: Likely due to hypoalbuminemia in the setting of poor nutritional status.  Chronic respiratory failure with hypoxia: Due to COPD continue current home oxygen.  Essential hypertension Continue to hold antihypertensive medication.   Protein caloric malnutrition Estimated body mass index is 18.29 kg/m as calculated from the following:  Height as of this encounter: 4' 11.02" (1.499 m).   Weight as of this encounter: 41.1 kg. Malnutrition Type:  Nutrition Problem: Severe Malnutrition Etiology: chronic illness(COPD/chronic respiratory failure with hypoxia on 2 L O2 at baseline, severe esophagitis, esophageal stenosis)   Malnutrition Characteristics:  Signs/Symptoms: moderate fat depletion, severe fat depletion, moderate muscle depletion, severe muscle depletion, percent weight loss Percent weight loss: 14 %   Nutrition Interventions:  Interventions: Ensure Enlive (each supplement provides 350kcal and 20 grams of protein), Magic cup, TPN  DVT prophylaxis: lovenox Family Communication: Daughter Disposition Plan/Barrier to D/C: Home with physical therapy once her odynophagia is improved, she continues to have pain with eating. Code Status:     Code Status Orders  (From admission, onward)         Start     Ordered   10/02/19 1817  Full code  Continuous     10/02/19 1816        Code Status History    Date Active Date Inactive Code Status Order ID Comments User Context   10/01/2019 2157 10/02/2019 1816 DNR [id number]  Lenore Cordia, MD ED   09/09/2019 2119 09/19/2019 1850 Full Code [id number]  Nita Sells, MD ED   06/29/2019 0947 07/01/2019 2116 Full Code [id number]  Reubin Milan, MD ED   04/02/2019 2138 04/07/2019 1856 Full Code [id number]  Jani Gravel, MD ED   06/15/2018 2152 06/18/2018 2205 Full Code [id number]  Rise Patience, MD ED   Advance Care Planning Activity    Advance Directive Documentation     Most Recent Value  Type of Advance Directive  Living will, Healthcare Power of Attorney  Pre-existing out of facility DNR order (  yellow form or pink MOST form)  --  "MOST" Form in Place?  --        IV Access:    Peripheral IV   Procedures and diagnostic studies:   No results found.   Medical Consultants:    None.  Anti-Infectives:   IV Diflucan  Subjective:    Claudia Wiley she relates that when she eats it bothers her in the epigastric area.  Objective:    Vitals:   10/09/19 2021 10/09/19 2041 10/10/19 0537 10/10/19 0809  BP:  120/69 136/82   Pulse:  88 90   Resp:  18 18   Temp:  98.7 F (37.1 C) 98.7 F (37.1 C)   TempSrc:  Oral Oral   SpO2: 94% 99% 100% 98%  Weight:      Height:       SpO2: 98 % O2 Flow Rate (L/min): 2 L/min(uses 2 lpm at home) FiO2 (%): 28 %   Intake/Output Summary (Last 24 hours) at 10/10/2019 0843 Last data filed at 10/10/2019 [id number] Gross per 24 hour  Intake 1047.64 ml  Output 3350 ml  Net -2302.36 ml   Filed Weights   10/07/19 0500 10/08/19 0645 10/09/19 0643  Weight: 41.6 kg 40.2 kg 41.1 kg    Exam: General exam: In no acute distress, cachectic appearing . respiratory system: Good air movement and clear to auscultation. Cardiovascular system: S1 & S2 heard, RRR. No JVD. Gastrointestinal system: Abdomen is nondistended, soft and nontender.  Extremities: No pedal edema. Skin: No rashes, lesions or ulcers  Data Reviewed:    Labs: Basic Metabolic Panel: Recent Labs  Lab 10/06/19 0405 10/06/19 0405 10/07/19 0415 10/07/19 0415 10/08/19 0857 10/08/19 0857 10/09/19 0850 10/10/19 0331  NA 142  --  141  --  142  --  140 141  K 4.1   < > 4.2   < > 4.7   < > 4.8 4.6  CL 104  --  101  --  98  --  97* 103  CO2 34*  --  35*  --  40*  --  38* 32  GLUCOSE 170*  --  107*  --  107*  --  113* 131*  BUN 7*  --  9  --  7*  --  6* 8  CREATININE 0.34*  --  <0.30*  --  0.33*  --  0.31* 0.33*  CALCIUM 7.8*  --  8.4*  --  9.1  --  8.8* 8.9  MG 2.1  --  1.6*  --  1.5*  --  1.5* 1.6*  PHOS 3.5  --  3.9  --  4.9*  --  3.3 2.7   < > = values in this interval not displayed.   GFR Estimated Creatinine Clearance: 47.9 mL/min (A) (by C-G formula based on SCr of 0.33 mg/dL (L)). Liver Function Tests: Recent Labs  Lab 10/03/19 1305 10/04/19 0455 10/07/19 0415  AST 21 19 18   ALT 21 17 14   ALKPHOS 68 68 57  BILITOT  0.7 0.8 0.1*  PROT 5.2* 5.0* 4.5*  ALBUMIN 2.8* 2.6* 2.2*   No results for input(s): LIPASE, AMYLASE in the last 168 hours. No results for input(s): AMMONIA in the last 168 hours. Coagulation profile No results for input(s): INR, PROTIME in the last 168 hours. COVID-19 Labs  No results for input(s): DDIMER, FERRITIN, LDH, CRP in the last 72 hours.  Lab Results  Component Value Date   [place] NEGATIVE 10/01/2019   [place]  NEGATIVE 09/09/2019   SARSCOV2NAA NEGATIVE 06/29/2019   [place] NEGATIVE 04/02/2019    CBC: Recent Labs  Lab 10/04/19 0455 10/06/19 0405  WBC 16.1* 5.8  NEUTROABS 10.9*  --   HGB 10.2* 8.9*  HCT 35.5* 28.7*  MCV 100.6* 94.4  PLT 284 221   Cardiac Enzymes: No results for input(s): CKTOTAL, CKMB, CKMBINDEX, TROPONINI in the last 168 hours. BNP (last 3 results) No results for input(s): PROBNP in the last 8760 hours. CBG: Recent Labs  Lab 10/09/19 1148 10/09/19 1659 10/09/19 2042 10/10/19 0053 10/10/19 0537  GLUCAP 110* 105* 101* 118* 133*   D-Dimer: No results for input(s): DDIMER in the last 72 hours. Hgb A1c: No results for input(s): HGBA1C in the last 72 hours. Lipid Profile: No results for input(s): CHOL, HDL, LDLCALC, TRIG, CHOLHDL, LDLDIRECT in the last 72 hours. Thyroid function studies: No results for input(s): TSH, T4TOTAL, T3FREE, THYROIDAB in the last 72 hours.  Invalid input(s): FREET3 Anemia work up: No results for input(s): VITAMINB12, FOLATE, FERRITIN, TIBC, IRON, RETICCTPCT in the last 72 hours. Sepsis Labs: Recent Labs  Lab 10/04/19 0455 10/06/19 0405  WBC 16.1* 5.8   Microbiology Recent Results (from the past 240 hour(s))  SARS CORONAVIRUS 2 (TAT 6-24 HRS) Nasopharyngeal Nasopharyngeal Swab     Status: None   Collection Time: 10/01/19  9:58 PM   Specimen: Nasopharyngeal Swab  Result Value Ref Range Status   SARS Coronavirus 2 NEGATIVE NEGATIVE Final    Comment: (NOTE) SARS-CoV-2 target nucleic acids  are NOT DETECTED. The SARS-CoV-2 RNA is generally detectable in upper and lower respiratory specimens during the acute phase of infection. Negative results do not preclude SARS-CoV-2 infection, do not rule out co-infections with other pathogens, and should not be used as the sole basis for treatment or other patient management decisions. Negative results must be combined with clinical observations, patient history, and epidemiological information. The expected result is Negative. Fact Sheet for Patients: SugarRoll.be Fact Sheet for Healthcare Providers: https://www.woods-mathews.com/ This test is not yet approved or cleared by the [place] FDA and  has been authorized for detection and/or diagnosis of SARS-CoV-2 by FDA under an Emergency Use Authorization (EUA). This EUA will remain  in effect (meaning this test can be used) for the duration of the COVID-19 declaration under Section 56 4(b)(1) of the Act, 21 U.S.C. section 360bbb-3(b)(1), unless the authorization is terminated or revoked sooner. Performed at [facility], [place] [address], [place], [place] [address]      Medications:   . Chlorhexidine Gluconate Cloth  6 each Topical Daily  . enoxaparin (LOVENOX) injection  30 mg Subcutaneous Daily  . feeding supplement (ENSURE ENLIVE)  237 mL Oral QID  . insulin aspart  0-9 Units Subcutaneous Q6H  . mouth rinse  15 mL Mouth Rinse BID  . metoCLOPramide (REGLAN) injection  5 mg Intravenous TID PC & HS  . mometasone-formoterol  2 puff Inhalation BID  . pantoprazole (PROTONIX) IV  40 mg Intravenous Q12H  . sodium chloride flush  10-40 mL Intracatheter Q12H  . sodium chloride flush  3 mL Intravenous Q12H  . sucralfate  1 g Oral TID WC & HS  . umeclidinium bromide  1 puff Inhalation Daily   Continuous Infusions: . sodium chloride 10 mL/hr at 10/10/19 0555  . fluconazole (DIFLUCAN) IV 200 mg (10/09/19 2204)  . magnesium sulfate  bolus IVPB    . TPN ADULT (ION) 40 mL/hr at 10/09/19 1720      LOS: 8  days   Charlynne Cousins  [facility]  10/10/2019, 8:43 AM

## 2019-10-10 NOTE — Progress Notes (Signed)
PHARMACY - TOTAL PARENTERAL NUTRITION CONSULT NOTE   Indication: Intolerance to enteral feeding  Patient Measurements: Height: 4' 11.02" (149.9 cm) Weight: 90 lb 9.7 oz (41.1 kg) IBW/kg (Calculated) : 43.24 TPN AdjBW (KG): 34.4 Body mass index is 18.29 kg/m. Usual Weight:   Assessment: [name] with hx esophageal stricture (s/p dilation in July 2019) who was hospitalized from 2/25-3/7 for severe esophagitis and odynophagia as well as duodenal ulcers.  She presented back to the ED on 3/19 with c/o SOB and esophageal pain. EGD on 3/21 showed severe esophagitis with ulceration and esophageal stenosis. GI team recommends to avoid "NG tube placement given recent esophageal injury" and  start TPN for nutrition.  Glucose / Insulin: no hx DM - CBGs 101-133 on TPN at 30 ml/hr, 1 unit insulin/ 24 hr  Electrolytes: Na, K, Pho, Cl, CO2 wnl, corrCa 10.2, Mag 1.6 (goal>2), (maxed Cl in TPN formula) Renal: SCr WNL LFTs / TGs: LFTs wnl, Trig 103 (3/22) Prealbumin / albumin:  - albumin low 2.2, prealbumin low 8.5 (3/22) Intake / Output; MIVF: - NS at 10 ml/hr - UOP noted 4.3L out 3/27 - extremely high considering prev day 475 ml UOP  Imaging: Chest CT 3/21: mild, diffuse esophageal wall thickening, prob esophagitis, no free air, small R pleural effusion, mod emphysematous changes Surgeries / Procedures:  - 3/21 EGD: LA grade D esophagitis with contact bleeding from incisors. Scattered plaques consistent with candidiasis  Central access:  PICC placed 3/22 for TPN TPN start date: 3/22   Nutritional Goals (per RD recommendation on 3/20): Kcal:  1450-1750 Protein:  73-88 Fluid:  >/= 1.4 L/day  Goal TPN rate is 60 mL/hr (provides 81 g of protein and 1636 kcals per day)  Current Nutrition:  - Full liquid diet - Ensure ordered QID usually no more than 2 daily - 4 taken 3/27, added Magic Cups to trays - TPN  Plan: repeat Magnesium 4gm IV x1 now, requiring Mag 4gm bolus daily  At  1800:  Continue TPN at 40 mL/hr   - Electrolytes in TPN: 33mEq/L - Na, 64mEq/L - K, 76mEq/L - Ca, 15 mEq/L - Mg, 54mmol/L - Phos. - Cl:Ac ratio, will max Chloride (TPN program will use more Cl if we maximize, unable to calc 2:1) - Add trace elements to TPN; MVI on MWF only due to national backorder - continue Sensitive q6h SSI and adjust as needed  - continue MIVF at 10 ml/hr per MD - Full TPN labs on Mon/Thurs, checking labs daily with low Nicholas Lose PharmD Pager [phone] 10/10/2019, 10:38 AM

## 2019-10-11 DIAGNOSIS — K222 Esophageal obstruction: Secondary | ICD-10-CM

## 2019-10-11 LAB — COMPREHENSIVE METABOLIC PANEL
ALT: 26 U/L (ref 0–44)
AST: 31 U/L (ref 15–41)
Albumin: 2.7 g/dL — ABNORMAL LOW (ref 3.5–5.0)
Alkaline Phosphatase: 71 U/L (ref 38–126)
Anion gap: 5 (ref 5–15)
BUN: 11 mg/dL (ref 8–23)
CO2: 30 mmol/L (ref 22–32)
Calcium: 8.7 mg/dL — ABNORMAL LOW (ref 8.9–10.3)
Chloride: 105 mmol/L (ref 98–111)
Creatinine, Ser: 0.33 mg/dL — ABNORMAL LOW (ref 0.44–1.00)
GFR calc Af Amer: >60 mL/min (ref >60)
GFR calc non Af Amer: >60 mL/min (ref >60)
Glucose, Bld: 113 mg/dL — ABNORMAL HIGH (ref 70–99)
Potassium: 4.2 mmol/L (ref 3.5–5.1)
Sodium: 140 mmol/L (ref 135–145)
Total Bilirubin: 0.4 mg/dL (ref 0.3–1.2)
Total Protein: 5.6 g/dL — ABNORMAL LOW (ref 6.5–8.1)

## 2019-10-11 LAB — DIFFERENTIAL
Abs Immature Granulocytes: 0.03 10*3/uL (ref 0.00–0.07)
Basophils Absolute: 0.1 10*3/uL (ref 0.0–0.1)
Basophils Relative: 1 %
Eosinophils Absolute: 0.1 10*3/uL (ref 0.0–0.5)
Eosinophils Relative: 2 %
Immature Granulocytes: 1 %
Lymphocytes Relative: 39 %
Lymphs Abs: 2 10*3/uL (ref 0.7–4.0)
Monocytes Absolute: 1 10*3/uL (ref 0.1–1.0)
Monocytes Relative: 18 %
Neutro Abs: 2.1 10*3/uL (ref 1.7–7.7)
Neutrophils Relative %: 39 %

## 2019-10-11 LAB — CBC
HCT: 32.3 % — ABNORMAL LOW (ref 36.0–46.0)
Hemoglobin: 9.9 g/dL — ABNORMAL LOW (ref 12.0–15.0)
MCH: 29.1 pg (ref 26.0–34.0)
MCHC: 30.7 g/dL (ref 30.0–36.0)
MCV: 95 fL (ref 80.0–100.0)
Platelets: 303 10*3/uL (ref 150–400)
RBC: 3.4 MIL/uL — ABNORMAL LOW (ref 3.87–5.11)
RDW: 14.9 % (ref 11.5–15.5)
WBC: 5.2 10*3/uL (ref 4.0–10.5)
nRBC: 0 % (ref 0.0–0.2)

## 2019-10-11 LAB — GLUCOSE, CAPILLARY
Glucose-Capillary: 125 mg/dL — ABNORMAL HIGH (ref 70–99)
Glucose-Capillary: 143 mg/dL — ABNORMAL HIGH (ref 70–99)
Glucose-Capillary: 135 mg/dL — ABNORMAL HIGH (ref 70–99)
Glucose-Capillary: 140 mg/dL — ABNORMAL HIGH (ref 70–99)

## 2019-10-11 LAB — PREALBUMIN: Prealbumin: 12.4 mg/dL — ABNORMAL LOW (ref 18–38)

## 2019-10-11 LAB — TRIGLYCERIDES: Triglycerides: 99 mg/dL (ref <150)

## 2019-10-11 LAB — PHOSPHORUS: Phosphorus: 3.1 mg/dL (ref 2.5–4.6)

## 2019-10-11 LAB — MAGNESIUM: Magnesium: 1.6 mg/dL — ABNORMAL LOW (ref 1.7–2.4)

## 2019-10-11 MED ORDER — FLUCONAZOLE IN SODIUM CHLORIDE 400-0.9 MG/200ML-% IV SOLN
400.0000 mg | INTRAVENOUS | Status: DC
  Administered 2019-10-11 – 2019-10-15 (×5): 400 mg via INTRAVENOUS
  Filled 2019-10-11 (×5): qty 200

## 2019-10-11 MED ORDER — SALINE SPRAY 0.65 % NA SOLN
1.0000 | NASAL | Status: DC | PRN
  Administered 2019-10-11: 1 via NASAL
  Filled 2019-10-11: qty 44

## 2019-10-11 MED ORDER — ENSURE ENLIVE PO LIQD
237.0000 mL | Freq: Two times a day (BID) | ORAL | Status: DC
  Administered 2019-10-11 – 2019-10-21 (×15): 237 mL via ORAL

## 2019-10-11 MED ORDER — MAGNESIUM SULFATE 50 % IJ SOLN
6.0000 g | Freq: Once | INTRAVENOUS | Status: AC
  Administered 2019-10-11: 6 g via INTRAVENOUS
  Filled 2019-10-11: qty 10

## 2019-10-11 MED ORDER — TRAVASOL 10 % IV SOLN
INTRAVENOUS | Status: AC
  Filled 2019-10-11: qty 806.4

## 2019-10-11 NOTE — Progress Notes (Addendum)
[facility] PROGRESS NOTE    Progress Note  Claudia Wiley  [id number] DOB: 1955/08/14 DOA: 10/01/2019 PCP: [facility]     Brief Narrative:   Claudia Wiley is an 64 y.o. 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  Assessment/Plan:   Candida esophagitis due to odynophagia: On admission she was started on admission on IV fluconazole GI was consulted to perform an EGD on 10/03/2019 showed severe esophagitis with ulceration with bleeding, some esophageal stenosis developing. Continue TPN she is only eating about 10% of her meals as she continues to have odynophagia. She continues to have minimal oral intake. I will go ahead and increase fluconazole, we have consulted GI for rescoping may need to change regimen will discuss with GI might need ID assistance.  Acute respiratory failure with hypercapnia: Patient was placed on BiPAP temporarily and is now resolved, her BiPAP was discontinued. Unclear etiology.  Opioid overdose: She became lethargic on 10/03/2019 Dilaudid was discontinued, now she is on oxycodone and Tylenol as needed.  Orthostatic hypotension: Orthostatics have resolved we will KVO IV fluids.  Anasarca: Likely due to hypoalbuminemia in the setting of poor nutritional status.  Chronic respiratory failure with hypoxia: Due to COPD continue current home oxygen.  Essential hypertension Continue to hold antihypertensive medication. Not need antihypertensive medications when she goes home.   Protein caloric  malnutrition Estimated body mass index is 15.5 kg/m as calculated from the following:   Height as of this encounter: 4' 11.02" (1.499 m).   Weight as of this encounter: 34.8 kg. Malnutrition Type:  Nutrition Problem: Severe Malnutrition Etiology: chronic illness(COPD/chronic respiratory failure with hypoxia on 2 L O2 at baseline, severe esophagitis, esophageal stenosis)   Malnutrition Characteristics:  Signs/Symptoms: moderate fat depletion, severe fat depletion, moderate muscle depletion, severe muscle depletion, percent weight loss Percent weight loss: 14 %   Nutrition Interventions:  Interventions: Ensure Enlive (each supplement provides 350kcal and 20 grams of protein), Magic cup, TPN  DVT prophylaxis: lovenox Family Communication: Daughter Disposition Plan/Barrier to D/C: Again she can probably go home with physical therapy once her odynophagia has improved and she is able to tolerate her diet she continues to be on TPN.   Code Status:     Code Status Orders  (From admission, onward)         Start     Ordered   10/02/19 1817  Full code  Continuous     10/02/19 1816        Code Status History    Date Active Date Inactive Code Status Order ID Comments User Context   10/01/2019 2157 10/02/2019 1816 DNR [id number]  Lenore Cordia, MD ED   09/09/2019 2119 09/19/2019 1850 Full Code [id number]  Nita Sells, MD ED   06/29/2019 0947 07/01/2019 2116 Full Code [id number]  Reubin Milan, MD ED   04/02/2019 2138 04/07/2019 1856 Full Code [id number]  Jani Gravel, MD ED   06/15/2018 2152 06/18/2018 2205 Full Code [id number]  Rise Patience, MD ED   Advance Care Planning Activity    Advance Directive Documentation  Most Recent Value  Type of Advance Directive  Living will, Healthcare Power of Attorney  Pre-existing out of facility DNR order (yellow form or pink MOST form)  --  "MOST" Form in Place?  --        IV Access:    Peripheral IV   Procedures and  diagnostic studies:   No results found.   Medical Consultants:    None.  Anti-Infectives:   IV Diflucan  Subjective:    Claudia Wiley continues to complain of odynophagia with swallowing.  Objective:    Vitals:   10/10/19 2131 10/11/19 0339 10/11/19 0604 10/11/19 0800  BP: 126/69  120/75   Pulse: 82  95   Resp: 16  18   Temp: 99.4 F (37.4 C)  98.8 F (37.1 C)   TempSrc: Oral  Oral   SpO2: 98% 98% 98% 97%  Weight:   34.8 kg   Height:       SpO2: 97 % O2 Flow Rate (L/min): 2 L/min FiO2 (%): 28 %   Intake/Output Summary (Last 24 hours) at 10/11/2019 0951 Last data filed at 10/11/2019 0600 Gross per 24 hour  Intake 1476.71 ml  Output 975 ml  Net 501.71 ml   Filed Weights   10/09/19 0643 10/10/19 1800 10/11/19 0604  Weight: 41.1 kg 35.4 kg 34.8 kg    Exam: General exam: In no acute distress. Respiratory system: Good air movement and clear to auscultation. Cardiovascular system: S1 & S2 heard, RRR. No JVD. Gastrointestinal system: Abdomen is nondistended, soft and nontender.  Extremities: No pedal edema. Skin: No rashes, lesions or ulcers  Data Reviewed:    Labs: Basic Metabolic Panel: Recent Labs  Lab 10/07/19 0415 10/07/19 0415 10/08/19 0857 10/08/19 0857 10/09/19 0850 10/09/19 0850 10/10/19 0331 10/11/19 0321  NA 141  --  142  --  140  --  141 140  K 4.2   < > 4.7   < > 4.8   < > 4.6 4.2  CL 101  --  98  --  97*  --  103 105  CO2 35*  --  40*  --  38*  --  32 30  GLUCOSE 107*  --  107*  --  113*  --  131* 113*  BUN 9  --  7*  --  6*  --  8 11  CREATININE <0.30*  --  0.33*  --  0.31*  --  0.33* 0.33*  CALCIUM 8.4*  --  9.1  --  8.8*  --  8.9 8.7*  MG 1.6*  --  1.5*  --  1.5*  --  1.6* 1.6*  PHOS 3.9  --  4.9*  --  3.3  --  2.7 3.1   < > = values in this interval not displayed.   GFR Estimated Creatinine Clearance: 40.6 mL/min (A) (by C-G formula based on SCr of 0.33 mg/dL (L)). Liver Function Tests: Recent Labs  Lab 10/07/19 0415  10/11/19 0321  AST 18 31  ALT 14 26  ALKPHOS 57 71  BILITOT 0.1* 0.4  PROT 4.5* 5.6*  ALBUMIN 2.2* 2.7*   No results for input(s): LIPASE, AMYLASE in the last 168 hours. No results for input(s): AMMONIA in the last 168 hours. Coagulation profile No results for input(s): INR, PROTIME in the last 168 hours. COVID-19 Labs  No results for input(s): DDIMER, FERRITIN, LDH, CRP in the last 72 hours.  Lab Results  Component Value Date   [place] NEGATIVE 10/01/2019  [place] NEGATIVE 09/09/2019   [place] NEGATIVE 06/29/2019   [place] NEGATIVE 04/02/2019    CBC: Recent Labs  Lab 10/06/19 0405 10/11/19 0321  WBC 5.8 5.2  NEUTROABS  --  2.1  HGB 8.9* 9.9*  HCT 28.7* 32.3*  MCV 94.4 95.0  PLT 221 303   Cardiac Enzymes: No results for input(s): CKTOTAL, CKMB, CKMBINDEX, TROPONINI in the last 168 hours. BNP (last 3 results) No results for input(s): PROBNP in the last 8760 hours. CBG: Recent Labs  Lab 10/10/19 0537 10/10/19 1204 10/10/19 1748 10/11/19 0114 10/11/19 0612  GLUCAP 133* 133* 114* 125* 143*   D-Dimer: No results for input(s): DDIMER in the last 72 hours. Hgb A1c: No results for input(s): HGBA1C in the last 72 hours. Lipid Profile: Recent Labs    10/11/19 0321  TRIG 99   Thyroid function studies: No results for input(s): TSH, T4TOTAL, T3FREE, THYROIDAB in the last 72 hours.  Invalid input(s): FREET3 Anemia work up: No results for input(s): VITAMINB12, FOLATE, FERRITIN, TIBC, IRON, RETICCTPCT in the last 72 hours. Sepsis Labs: Recent Labs  Lab 10/06/19 0405 10/11/19 0321  WBC 5.8 5.2   Microbiology Recent Results (from the past 240 hour(s))  SARS CORONAVIRUS 2 (TAT 6-24 HRS) Nasopharyngeal Nasopharyngeal Swab     Status: None   Collection Time: 10/01/19  9:58 PM   Specimen: Nasopharyngeal Swab  Result Value Ref Range Status   SARS Coronavirus 2 NEGATIVE NEGATIVE Final    Comment: (NOTE) SARS-CoV-2 target nucleic acids are NOT  DETECTED. The SARS-CoV-2 RNA is generally detectable in upper and lower respiratory specimens during the acute phase of infection. Negative results do not preclude SARS-CoV-2 infection, do not rule out co-infections with other pathogens, and should not be used as the sole basis for treatment or other patient management decisions. Negative results must be combined with clinical observations, patient history, and epidemiological information. The expected result is Negative. Fact Sheet for Patients: SugarRoll.be Fact Sheet for Healthcare Providers: https://www.woods-mathews.com/ This test is not yet approved or cleared by the [place] FDA and  has been authorized for detection and/or diagnosis of SARS-CoV-2 by FDA under an Emergency Use Authorization (EUA). This EUA will remain  in effect (meaning this test can be used) for the duration of the COVID-19 declaration under Section 56 4(b)(1) of the Act, 21 U.S.C. section 360bbb-3(b)(1), unless the authorization is terminated or revoked sooner. Performed at [facility], [place] [address] [place], [place] [address]      Medications:   . Chlorhexidine Gluconate Cloth  6 each Topical Daily  . enoxaparin (LOVENOX) injection  30 mg Subcutaneous Daily  . feeding supplement (ENSURE ENLIVE)  237 mL Oral QID  . insulin aspart  0-9 Units Subcutaneous Q6H  . mouth rinse  15 mL Mouth Rinse BID  . mometasone-formoterol  2 puff Inhalation BID  . pantoprazole (PROTONIX) IV  40 mg Intravenous Q12H  . sodium chloride flush  10-40 mL Intracatheter Q12H  . sodium chloride flush  3 mL Intravenous Q12H  . sucralfate  1 g Oral TID WC & HS  . umeclidinium bromide  1 puff Inhalation Daily   Continuous Infusions: . sodium chloride 10 mL/hr at 10/11/19 0600  . fluconazole (DIFLUCAN) IV Stopped (10/10/19 2253)  . magnesium sulfate bolus IVPB 6 g (10/11/19 0916)  . TPN ADULT (ION) 40 mL/hr at 10/11/19 0600    . TPN ADULT (ION)        LOS: 9 days   Charlynne Cousins  [facility]  10/11/2019, 9:51 AM

## 2019-10-11 NOTE — Progress Notes (Signed)
Nutrition Follow-up  DOCUMENTATION CODES:   Severe malnutrition in context of chronic illness, Underweight  INTERVENTION:  - continue Ensure Enlive but will decrease from QID to BID.  - continue Magic Cup BID with meals. - continue TPN per Pharmacy. - continue to encourage PO intakes and advance diet as medically feasible.    NUTRITION DIAGNOSIS:   Severe Malnutrition related to chronic illness(COPD/chronic respiratory failure with hypoxia on 2 L O2 at baseline, severe esophagitis, esophageal stenosis) as evidenced by moderate fat depletion, severe fat depletion, moderate muscle depletion, severe muscle depletion, percent weight loss. -ongoing  GOAL:   Patient will meet greater than or equal to 90% of their needs -met with PO intakes and TPN regimen  MONITOR:   Weight trends, Labs, I & O's, Diet advancement, Supplement acceptance, PO intake  ASSESSMENT:   64 year old female with past medical history of COPD, chronic respiratory failure with hypoxia on 2 L O2 at home, history of lung cancer s/p LUL lobectomy, gastrinoma s/p Whipple procedure, esophageal stricture (dilation 2019), HTN, and recent admission for severe Candida esophagitis with esophageal necrosis. Patient presented to ED for recurrent emesis and lack of intake secondary to odynophagia.  Significant Events: 3/19- admission  3/21- EGD which showed severe esophagitis with ulceration and esophageal stenosis; diet advanced from NPO to FLD 3/22- double lumen PICC placed in R basilic; TPN initiation   Weight has fluctuated throughout admission; weight today is consistent with admission weight. Patient has been on FLD since 3/21 at [facility]. Flow sheet documentation indicates 10% completion of breakfast and 10% of lunch yesterday; 10% of breakfast today. She has been accepting Ensure ~50% of the time offered.   She is receiving custom TPN @ 60 ml/hr which provides 1636 kcal and 81 grams protein. This meets 100% estimated  nutrition needs.   She reports ongoing lack of appetite and that pain when swallowing makes her not want to try to take in PO items. She also feels that items are not going down well and get stuck before reaching her stomach.   Per notes: - candida esophagitis with associated odynophagia - will need EGD with dilation in the future - anasarca thought to be d/t hypoalbuminemia for patient with poor oral intake - severe protein calorie malnutrition    Labs reviewed; CBGs: 125, 143, 135 mg/dl, creatinine: 0.33 mg/dl, Ca: 8.7 mg/dl, Mg: 1.6 mg/dl. Medications reviewed; sliding scale novolog, 6 g IV Mg sulfate x1 run 3/29, 40 mg IV protonix BID, 1 g carafate TID.    Diet Order:   Diet Order            Diet full liquid Room service appropriate? Yes; Fluid consistency: Thin  Diet effective now              EDUCATION NEEDS:   No education needs have been identified at this time  Skin:  Skin Assessment: Reviewed RN Assessment  Last BM:  3/27  Height:   Ht Readings from Last 1 Encounters:  10/01/19 4' 11.02" (1.499 m)    Weight:   Wt Readings from Last 1 Encounters:  10/11/19 34.8 kg    Ideal Body Weight:     BMI:  Body mass index is 15.5 kg/m.  Estimated Nutritional Needs:   Kcal:  1450-1750  Protein:  73-88  Fluid:  >/= 1.4 L/day     Jarome Matin, MS, RD, LDN, CNSC Inpatient Clinical Dietitian RD pager # available in [facility]  After hours/weekend pager # available in [facility]

## 2019-10-11 NOTE — Progress Notes (Addendum)
     Progress Note    ASSESSMENT AND PLAN:   # Recent esophageal necrosis / ongoing dysphagia / odynophagia. --Consuming negligible amount of PO due to discomfort   --Follow up EGD 10/03/19 >>> severe esophagitis with healing ulceration / candidiasis, lumen narrowing. Esoph bx >>> acute esophagitis with ulceration. Viral stains for HSV, CMV negative.GMS  special stain shows yeast form of Candida but fungal hyphae are not  seen. --On day 8 of fluconazole --On BID IV PPI --Taking Carafate suspension TID and HS --Getting Reglan to help prevent reflux --Still receiving TNA --We can add viscous lidocaine. Repeat EGD at this point probably won't add much. At some point, when healed she will need EGD with dilation   Agree with Ms. Vanita Ingles assessment and plan. Gatha Mayer, MD, [facility]   Will also check a gastrin level given hx gastrinoma - note that she does not have post Whipple anatomy on EGD  We can consider a barium swallow to reassess also  Gatha Mayer, MD, [facility] 10/11/2019 3:29 PM      SUBJECTIVE   Really hurts to swallow. Even mild hurts to go down down. Feels like any thing she consumes stops at top of stomach.    OBJECTIVE:     Vital signs in last 24 hours: Temp:  [98.3 F (36.8 C)-99.4 F (37.4 C)] 98.8 F (37.1 C) (03/29 0604) Pulse Rate:  [82-95] 95 (03/29 0604) Resp:  [16-18] 18 (03/29 0604) BP: (118-126)/(69-87) 120/75 (03/29 0604) SpO2:  [97 %-100 %] 97 % (03/29 0800) FiO2 (%):  [28 %] 28 % (03/29 0339) Weight:  [34.8 kg-35.4 kg] 34.8 kg (03/29 0604) Last BM Date: 10/09/19 General:   Alert, pleasant, thin female in NAD EENT:  Normal hearing, non icteric sclera   Heart:  Regular rate and rhythm;  No lower extremity edema   Pulm: Normal respiratory effort   Abdomen:  Soft, nondistended, nontender.  Normal bowel sounds.          Neurologic:  Alert and  oriented x4;  grossly normal neurologically. Psych:  Pleasant,  cooperative.  Normal mood and affect.   Intake/Output from previous day: 03/28 0701 - 03/29 0700 In: 1536.7 [P.O.:290; I.V.:1046.8; IV Piggyback:199.9] Out: [id number] [Urine:1375] Intake/Output this shift: No intake/output data recorded.  Lab Results: Recent Labs    10/11/19 0321  WBC 5.2  HGB 9.9*  HCT 32.3*  PLT 303   BMET Recent Labs    10/09/19 0850 10/10/19 0331 10/11/19 0321  NA 140 141 140  K 4.8 4.6 4.2  CL 97* 103 105  CO2 38* 32 30  GLUCOSE 113* 131* 113*  BUN 6* 8 11  CREATININE 0.31* 0.33* 0.33*  CALCIUM 8.8* 8.9 8.7*   LFT Recent Labs    10/11/19 0321  PROT 5.6*  ALBUMIN 2.7*  AST 31  ALT 26  ALKPHOS 71  BILITOT 0.4     Principal Problem:   Candida esophagitis (HCC) Active Problems:   Unspecified protein-calorie malnutrition (HCC)   Essential hypertension   Pancreatic insufficiency   COPD (chronic obstructive pulmonary disease) (HCC)   Esophagitis   Esophageal stricture   Orthostasis   Protein-calorie malnutrition, severe     LOS: 9 days   Tye Savoy ,NP 10/11/2019, 9:44 AM

## 2019-10-11 NOTE — Progress Notes (Signed)
PHARMACY - TOTAL PARENTERAL NUTRITION CONSULT NOTE   Indication: Intolerance to enteral feeding  Patient Measurements: Height: 4' 11.02" (149.9 cm) Weight: 76 lb 12.8 oz (34.8 kg) IBW/kg (Calculated) : 43.24 TPN AdjBW (KG): 34.4 Body mass index is 15.5 kg/m. Usual Weight:   Assessment: [name] with hx esophageal stricture (s/p dilation in July 2019) who was hospitalized from 2/25-3/7 for severe esophagitis and odynophagia as well as duodenal ulcers.  She presented back to the ED on 3/19 with c/o SOB and esophageal pain. EGD on 3/21 showed severe esophagitis with ulceration and esophageal stenosis. GI team recommends to avoid "NG tube placement given recent esophageal injury" and  start TPN for nutrition.  Glucose / Insulin: no hx DM - CBGs 114-143 on TPN at 40 ml/hr, 4unit insulin/ 24 hr Electrolytes: Na, K, Phos, Cl, CO2 wnl, corrCa 9.74, Mag 1.6 (goal>2), (maxed Cl in TPN formula) Renal: SCr WNL LFTs / TGs: LFTs wnl, Trig 99 (3/29) Prealbumin / albumin:  - albumin up to 2.7, prealbumin  8.5 (3/22), 12.4 (3/29) Intake / Output; MIVF: - NS at 10 ml/hr - UOP 1375 mls/24 hrs  Imaging: Chest CT 3/21: mild, diffuse esophageal wall thickening, prob esophagitis, no free air, small R pleural effusion, mod emphysematous changes Surgeries / Procedures:  - 3/21 EGD: LA grade D esophagitis with contact bleeding from incisors. Scattered plaques consistent with candidiasis  Central access:  PICC placed 3/22 for TPN TPN start date: 3/22   Nutritional Goals (per RD recommendation on 3/20): Kcal:  1450-1750 Protein:  73-88 Fluid:  >/= 1.4 L/day  Goal TPN rate is 60 mL/hr (provides 81 g of protein and 1636 kcals per day)  Current Nutrition:  - Full liquid diet - Ensure ordered QID usually no more than 2 daily - 1 taken 3/28, added Magic Cups to trays - TPN  Plan: Now:  Magnesium 6 gm IV x1  At 1800:  increase TPN to goal rate of 60 mL/hr to provide ~ 81 gm protein and 1636  Kcals for 100% support - Electrolytes in TPN: 48mEq/L - Na, 30mEq/L - K, 25mEq/L - Ca, 15 mEq/L - Mg, 3mmol/L - Phos. - Cl:Ac ratio, continue max Chloride (TPN program will use more Cl if we maximize, unable to calc 2:1) - Add trace elements to TPN; MVI on MWF only due to national backorder - continue Sensitive q6h SSI and adjust as needed  - continue MIVF at 10 ml/hr per MD - Full TPN labs on Mon/Thurs, checking labs daily with low Mag  Eudelia Bunch, Pharm.D [phone] 10/11/2019 9:32 AM

## 2019-10-11 NOTE — Progress Notes (Signed)
Physical Therapy Treatment Patient Details Name: Claudia Wiley MRN: [id number] DOB: Feb 02, 1956 Today's Date: 10/11/2019    History of Present Illness 64 y.o. female past medical history of COPD on home O2 history of lung cancer status post left upper lobe lobectomy, gastrinoma status post Whipple procedure esophageal stricture and dilation in July 2019 with a recent admission for severe Candida esophagitis and esophageal necrosis presents secondary to dehydration in the setting of recurrent emesis and decreased oral intake secondary to odynophagia    PT Comments    Pt progressing toward PT goals, incr tolerance to gait distance/activity. Continue to recommend HHPT    Follow Up Recommendations  Home health PT;Supervision for mobility/OOB     Equipment Recommendations  None recommended by PT    Recommendations for Other Services       Precautions / Restrictions Precautions Precautions: Fall Precaution Comments: monitor sats Restrictions Weight Bearing Restrictions: No    Mobility  Bed Mobility Overal bed mobility: Modified Independent Bed Mobility: Supine to Sit;Sit to Supine              Transfers Overall transfer level: Needs assistance Equipment used: Rolling walker (2 wheeled) Transfers: Sit to/from Stand Sit to Stand: Supervision         General transfer comment: cues for hand placement on initial stand  Ambulation/Gait Ambulation/Gait assistance: Supervision Gait Distance (Feet): 180 Feet Assistive device: Rolling walker (2 wheeled) Gait Pattern/deviations: Step-through pattern;Decreased stride length     General Gait Details: close supervision for safety, pt abl eto maneuver RW on her own today, mild unsteadiness intially, improved with incr distance    Stairs             Wheelchair Mobility    Modified Rankin (Stroke Patients Only)       Balance                                            Cognition  Arousal/Alertness: Awake/alert Behavior During Therapy: WFL for tasks assessed/performed Overall Cognitive Status: Within Functional Limits for tasks assessed                                 General Comments: daughter present, encouraging. pt does not recall amb with PT on Friday      Exercises      General Comments        Pertinent Vitals/Pain Pain Assessment: No/denies pain    Home Living                      Prior Function            PT Goals (current goals can now be found in the care plan section) Acute Rehab PT Goals Patient Stated Goal: to go home soon PT Goal Formulation: With patient Time For Goal Achievement: 10/21/19 Potential to Achieve Goals: Good Progress towards PT goals: Progressing toward goals    Frequency    Min 3X/week      PT Plan Current plan remains appropriate    Co-evaluation              AM-PAC PT "6 Clicks" Mobility   Outcome Measure  Help needed turning from your back to your side while in a flat bed without using bedrails?: None Help needed moving from lying on your  back to sitting on the side of a flat bed without using bedrails?: None Help needed moving to and from a bed to a chair (including a wheelchair)?: None Help needed standing up from a chair using your arms (e.g., wheelchair or bedside chair)?: A Little Help needed to walk in hospital room?: A Little Help needed climbing 3-5 steps with a railing? : A Little 6 Click Score: 21    End of Session Equipment Utilized During Treatment: Oxygen Activity Tolerance: Patient tolerated treatment well Patient left: in bed;with call bell/phone within reach;with family/visitor present   PT Visit Diagnosis: Unsteadiness on feet (R26.81);Difficulty in walking, not elsewhere classified (R26.2)     Time: [id number] PT Time Calculation (min) (ACUTE ONLY): 21 min  Charges:  $Gait Training: 8-22 mins                     Baxter Flattery, PT   Acute Rehab Dept  [facility]): [id number]   10/11/2019    [facility] 10/11/2019, 12:20 PM

## 2019-10-12 ENCOUNTER — Inpatient Hospital Stay (HOSPITAL_COMMUNITY): Payer: No Typology Code available for payment source

## 2019-10-12 DIAGNOSIS — R1310 Dysphagia, unspecified: Secondary | ICD-10-CM

## 2019-10-12 LAB — BASIC METABOLIC PANEL
Anion gap: 4 — ABNORMAL LOW (ref 5–15)
BUN: 16 mg/dL (ref 8–23)
CO2: 28 mmol/L (ref 22–32)
Calcium: 8.4 mg/dL — ABNORMAL LOW (ref 8.9–10.3)
Chloride: 108 mmol/L (ref 98–111)
Creatinine, Ser: 0.32 mg/dL — ABNORMAL LOW (ref 0.44–1.00)
GFR calc Af Amer: >60 mL/min (ref >60)
GFR calc non Af Amer: >60 mL/min (ref >60)
Glucose, Bld: 126 mg/dL — ABNORMAL HIGH (ref 70–99)
Potassium: 4.6 mmol/L (ref 3.5–5.1)
Sodium: 140 mmol/L (ref 135–145)

## 2019-10-12 LAB — GLUCOSE, CAPILLARY
Glucose-Capillary: 116 mg/dL — ABNORMAL HIGH (ref 70–99)
Glucose-Capillary: 128 mg/dL — ABNORMAL HIGH (ref 70–99)

## 2019-10-12 LAB — PHOSPHORUS: Phosphorus: 3.3 mg/dL (ref 2.5–4.6)

## 2019-10-12 LAB — MAGNESIUM: Magnesium: 1.9 mg/dL (ref 1.7–2.4)

## 2019-10-12 MED ORDER — LIDOCAINE VISCOUS HCL 2 % MT SOLN: 15.0000 mL | OROMUCOSAL | Status: DC | PRN

## 2019-10-12 MED ORDER — TRAVASOL 10 % IV SOLN
INTRAVENOUS | Status: AC
  Filled 2019-10-12: qty 806.4

## 2019-10-12 MED ORDER — MAGNESIUM SULFATE 2 GM/50ML IV SOLN
2.0000 g | Freq: Once | INTRAVENOUS | Status: AC
  Administered 2019-10-12: 2 g via INTRAVENOUS
  Filled 2019-10-12: qty 50

## 2019-10-12 NOTE — Progress Notes (Addendum)
     Progress Note    ASSESSMENT AND PLAN:   # Recent esophageal necrosis / ongoing dysphagia / odynophagia.  --Follow up EGD 10/03/19 >>> severe esophagitis with healing ulceration / candidiasis, lumen narrowing. Esoph bx >>> acute esophagitis with ulceration. Viral stains for HSV, CMV negative.GMS  special stain shows yeast form of Candida but fungal hyphae are not seen.  --Consuming negligible amount of PO due to discomfort  She tolerates thin fluids better than something like Ensure which goes down slow and causes discomfort.  --She doesn't want Lidocaine. Says she used it at home and it didn't help, plus it numbed her tongue --On day 9 of fluconazole, dose was increased yesterday --On BID IV PPI --Taking Carafate suspension TID and HS --Still receiving TNA --Reevaluate today with an esophogram --Gastrin level pending     [facility] Attending   I have taken an interval history, reviewed the chart and examined the patient. I agree with the Advanced Practitioner's note, impression and recommendations.   Ba swallow shows distal esophageal stenosis and stricture - my read - await radiology  Not surprising - ? If ready to have a dilation - will have to think about it  Gatha Mayer, MD, [facility] 10/12/2019 3:21 PM       SUBJECTIVE   Able to swallow milk (watered down with ice). Ensure goes done slower causing discomfort    OBJECTIVE:     Vital signs in last 24 hours: Temp:  [98.1 F (36.7 C)-98.5 F (36.9 C)] 98.2 F (36.8 C) (03/30 0609) Pulse Rate:  [85-96] 96 (03/30 0609) Resp:  [16-18] 18 (03/30 0609) BP: (107-150)/(65-81) 107/76 (03/30 0609) SpO2:  [94 %-100 %] 98 % (03/30 0830) Weight:  [34.7 kg] 34.7 kg (03/30 0609) Last BM Date: 10/09/19 General:   Alert, sitting up in bed. Seems in good spirits.  EENT:  Normal hearing, non icteric sclera   Heart:  Regular rate and rhythm;  No lower extremity edema   Pulm: Normal respiratory  effort   Abdomen:  Soft, nondistended, nontender.  Normal bowel sounds.          Neurologic:  Alert and  oriented x4;  grossly normal neurologically. Psych:  Pleasant, cooperative.  Normal mood and affect.   Intake/Output from previous day: 03/29 0701 - 03/30 0700 In: 744.5 [I.V.:744.5] Out: 1150 [Urine:1050; Stool:100] Intake/Output this shift: Total I/O In: -  Out: 150 [Urine:150]  Lab Results: Recent Labs    10/11/19 0321  WBC 5.2  HGB 9.9*  HCT 32.3*  PLT 303   BMET Recent Labs    10/10/19 0331 10/11/19 0321 10/12/19 0353  NA 141 140 140  K 4.6 4.2 4.6  CL 103 105 108  CO2 32 30 28  GLUCOSE 131* 113* 126*  BUN 8 11 16   CREATININE 0.33* 0.33* 0.32*  CALCIUM 8.9 8.7* 8.4*   LFT Recent Labs    10/11/19 0321  PROT 5.6*  ALBUMIN 2.7*  AST 31  ALT 26  ALKPHOS 71  BILITOT 0.4    Principal Problem:   Candida esophagitis (HCC) Active Problems:   Unspecified protein-calorie malnutrition (HCC)   Essential hypertension   Pancreatic insufficiency   COPD (chronic obstructive pulmonary disease) (HCC)   Esophagitis   Esophageal stricture   Orthostasis   Protein-calorie malnutrition, severe     LOS: 10 days   Tye Savoy ,NP 10/12/2019, 8:58 AM

## 2019-10-12 NOTE — Progress Notes (Signed)
[facility] PROGRESS NOTE    Progress Note  Claudia Wiley  [id number] DOB: [birth year] DOA: 10/01/2019 PCP: [facility]     Brief Narrative:   Claudia Wiley is an 64 y.o. female past medical history of COPD on home O2 history of lung cancer status post left upper lobe lobectomy,  esophageal stricture and dilation in July 2019, she was recently treated with a full course of IV fluconazole as an outpatient but was readmitted with recurrent severe Candida esophagitis and esophageal necrosis, EGD was performed on 10/03/2019, she has been on high-dose IV fluconazole and TPN she has not been tolerating her full liquid diet.  Assessment/Plan:   Candida esophagitis due to odynophagia: On admission she was started on admission on IV fluconazole GI was consulted to perform an EGD on 10/03/2019 showed severe esophagitis with ulceration with bleeding, some esophageal stenosis developing. She was started on TPN which she will continue, she is only eating about 10% of her meals (full liq diet) as she continues to have odynophagia. I will go ahead and increase fluconazole, GI was consulted recommended to start her on viscous lidocaine. She'll probably need an EGD for dilation at some point in the future.  Acute respiratory failure with hypercapnia: Patient was placed on BiPAP temporarily and is now resolved, her BiPAP was discontinued. Unclear etiology.  Opioid overdose: She became lethargic on 10/03/2019 Dilaudid was discontinued, now she is on oxycodone and Tylenol as needed.  Orthostatic hypotension: Orthostatics have resolved we will KVO IV fluids.  Anasarca: Likely due to hypoalbuminemia in the setting of poor nutritional status.  Chronic respiratory failure with hypoxia: Due to COPD continue current home oxygen.  Essential hypertension Continue to hold antihypertensive medication. Not need antihypertensive medications when she goes home.   Protein caloric  malnutrition Estimated body mass index is 15.42 kg/m as calculated from the following:   Height as of this encounter: 4' 11.02" (1.499 m).   Weight as of this encounter: 34.7 kg. Malnutrition Type:  Nutrition Problem: Severe Malnutrition Etiology: chronic illness(COPD/chronic respiratory failure with hypoxia on 2 L O2 at baseline, severe esophagitis, esophageal stenosis)   Malnutrition Characteristics:  Signs/Symptoms: moderate fat depletion, severe fat depletion, moderate muscle depletion, severe muscle depletion, percent weight loss Percent weight loss: 14 %   Nutrition Interventions:  Interventions: Ensure Enlive (each supplement provides 350kcal and 20 grams of protein), Magic cup, TPN  DVT prophylaxis: lovenox Family Communication: Daughter Disposition Plan/Barrier to D/C: Again she can probably go home with physical therapy once her odynophagia has improved and she is able to tolerate her diet she continues to be on TPN.   Code Status:     Code Status Orders  (From admission, onward)         Start     Ordered   10/02/19 1817  Full code  Continuous     10/02/19 1816        Code Status History    Date Active Date Inactive Code Status Order ID Comments User Context   10/01/2019 2157 10/02/2019 1816 DNR [id number]  Lenore Cordia, MD ED   09/09/2019 2119 09/19/2019 1850 Full Code [id number]  Nita Sells, MD ED   06/29/2019 0947 07/01/2019 2116 Full Code [id number]  Reubin Milan, MD ED   04/02/2019 2138 04/07/2019 1856 Full Code [id number]  Jani Gravel, MD ED   06/15/2018 2152 06/18/2018 2205 Full Code [id number]  Rise Patience, MD ED   Advance Care Planning Activity    Advance Directive  Documentation     Most Recent Value  Type of Advance Directive  Living will, Healthcare Power of Attorney  Pre-existing out of facility DNR order (yellow form or pink MOST form)  --  "MOST" Form in Place?  --        IV Access:    Peripheral IV   Procedures and  diagnostic studies:   No results found.   Medical Consultants:    None.  Anti-Infectives:   IV Diflucan  Subjective:    Claudia Wiley she continues to complain of edema aphasia.  Objective:    Vitals:   10/11/19 1233 10/11/19 1955 10/11/19 2055 10/12/19 0609  BP: (!) 150/81  111/65 107/76  Pulse: 88  85 96  Resp: 18  16 18   Temp: 98.1 F (36.7 C)  98.5 F (36.9 C) 98.2 F (36.8 C)  TempSrc: Oral  Oral Oral  SpO2: 100% 99% 94% 94%  Weight:    34.7 kg  Height:       SpO2: 94 % O2 Flow Rate (L/min): 2 L/min FiO2 (%): 28 %   Intake/Output Summary (Last 24 hours) at 10/12/2019 0829 Last data filed at 10/12/2019 [id number] Gross per 24 hour  Intake 744.45 ml  Output 1150 ml  Net -405.55 ml   Filed Weights   10/10/19 1800 10/11/19 0604 10/12/19 0609  Weight: 35.4 kg 34.8 kg 34.7 kg    Exam: General exam: In no acute distress. Respiratory system: Good air movement and clear to auscultation. Cardiovascular system: S1 & S2 heard, RRR. No JVD. Gastrointestinal system: Abdomen is nondistended, soft and nontender.  Extremities: No pedal edema. Skin: No rashes, lesions or ulcers  Data Reviewed:    Labs: Basic Metabolic Panel: Recent Labs  Lab 10/08/19 0857 10/08/19 0857 10/09/19 0850 10/09/19 0850 10/10/19 0331 10/10/19 0331 10/11/19 0321 10/12/19 0353  NA 142  --  140  --  141  --  140 140  K 4.7   < > 4.8   < > 4.6   < > 4.2 4.6  CL 98  --  97*  --  103  --  105 108  CO2 40*  --  38*  --  32  --  30 28  GLUCOSE 107*  --  113*  --  131*  --  113* 126*  BUN 7*  --  6*  --  8  --  11 16  CREATININE 0.33*  --  0.31*  --  0.33*  --  0.33* 0.32*  CALCIUM 9.1  --  8.8*  --  8.9  --  8.7* 8.4*  MG 1.5*  --  1.5*  --  1.6*  --  1.6* 1.9  PHOS 4.9*  --  3.3  --  2.7  --  3.1 3.3   < > = values in this interval not displayed.   GFR Estimated Creatinine Clearance: 40.5 mL/min (A) (by C-G formula based on SCr of 0.32 mg/dL (L)). Liver Function Tests: Recent  Labs  Lab 10/07/19 0415 10/11/19 0321  AST 18 31  ALT 14 26  ALKPHOS 57 71  BILITOT 0.1* 0.4  PROT 4.5* 5.6*  ALBUMIN 2.2* 2.7*   No results for input(s): LIPASE, AMYLASE in the last 168 hours. No results for input(s): AMMONIA in the last 168 hours. Coagulation profile No results for input(s): INR, PROTIME in the last 168 hours. COVID-19 Labs  No results for input(s): DDIMER, FERRITIN, LDH, CRP in the last 72 hours.  Lab Results  Component Value Date   SARSCOV2NAA NEGATIVE 10/01/2019   SARSCOV2NAA NEGATIVE 09/09/2019   SARSCOV2NAA NEGATIVE 06/29/2019   [place] NEGATIVE 04/02/2019    CBC: Recent Labs  Lab 10/06/19 0405 10/11/19 0321  WBC 5.8 5.2  NEUTROABS  --  2.1  HGB 8.9* 9.9*  HCT 28.7* 32.3*  MCV 94.4 95.0  PLT 221 303   Cardiac Enzymes: No results for input(s): CKTOTAL, CKMB, CKMBINDEX, TROPONINI in the last 168 hours. BNP (last 3 results) No results for input(s): PROBNP in the last 8760 hours. CBG: Recent Labs  Lab 10/11/19 0612 10/11/19 1155 10/11/19 1845 10/12/19 0008 10/12/19 0607  GLUCAP 143* 135* 140* 128* 116*   D-Dimer: No results for input(s): DDIMER in the last 72 hours. Hgb A1c: No results for input(s): HGBA1C in the last 72 hours. Lipid Profile: Recent Labs    10/11/19 0321  TRIG 99   Thyroid function studies: No results for input(s): TSH, T4TOTAL, T3FREE, THYROIDAB in the last 72 hours.  Invalid input(s): FREET3 Anemia work up: No results for input(s): VITAMINB12, FOLATE, FERRITIN, TIBC, IRON, RETICCTPCT in the last 72 hours. Sepsis Labs: Recent Labs  Lab 10/06/19 0405 10/11/19 0321  WBC 5.8 5.2   Microbiology No results found for this or any previous visit (from the past 240 hour(s)).   Medications:   . Chlorhexidine Gluconate Cloth  6 each Topical Daily  . enoxaparin (LOVENOX) injection  30 mg Subcutaneous Daily  . feeding supplement (ENSURE ENLIVE)  237 mL Oral BID BM  . mouth rinse  15 mL Mouth Rinse BID   . mometasone-formoterol  2 puff Inhalation BID  . pantoprazole (PROTONIX) IV  40 mg Intravenous Q12H  . sodium chloride flush  10-40 mL Intracatheter Q12H  . sodium chloride flush  3 mL Intravenous Q12H  . sucralfate  1 g Oral TID WC & HS  . umeclidinium bromide  1 puff Inhalation Daily   Continuous Infusions: . sodium chloride 10 mL/hr at 10/11/19 0600  . fluconazole (DIFLUCAN) IV 400 mg (10/11/19 1259)  . magnesium sulfate bolus IVPB    . TPN ADULT (ION) 60 mL/hr at 10/11/19 1747  . TPN ADULT (ION)        LOS: 10 days   Charlynne Cousins  [facility]  10/12/2019, 8:29 AM

## 2019-10-12 NOTE — Progress Notes (Signed)
PHARMACY - TOTAL PARENTERAL NUTRITION CONSULT NOTE   Indication: Intolerance to enteral feeding  Patient Measurements: Height: 4' 11.02" (149.9 cm) Weight: 76 lb 6.4 oz (34.7 kg) IBW/kg (Calculated) : 43.24 TPN AdjBW (KG): 34.4 Body mass index is 15.42 kg/m. Usual Weight:   Assessment: [name] with hx esophageal stricture (s/p dilation in July 2019) who was hospitalized from 2/25-3/7 for severe esophagitis and odynophagia as well as duodenal ulcers.  She presented back to the ED on 3/19 with c/o SOB and esophageal pain. EGD on 3/21 showed severe esophagitis with ulceration and esophageal stenosis. GI team recommends to avoid "NG tube placement given recent esophageal injury" and  start TPN for nutrition.  Glucose / Insulin: no hx DM - CBGs < 150 on TPN at 60 ml/hr, no insulin/ 24 hr- will DC SSI & CBGs Electrolytes: Na, K, Phos, Cl, CO2 wnl, corrCa 9.44, Mag 1.9 after 6 gm bolus yesterday (goal>2), (maxed Cl in TPN formula) Renal: SCr WNL LFTs / TGs: LFTs wnl, Trig 99 (3/29) Prealbumin / albumin:  - albumin up to 2.7, prealbumin  8.5 (3/22), 12.4 (3/29) Intake / Output; MIVF: - NS at 10 ml/hr - UOP 1050 mls/24 hrs. Stool 19ml/24hrs  Imaging: Chest CT 3/21: mild, diffuse esophageal wall thickening, prob esophagitis, no free air, small R pleural effusion, mod emphysematous changes Surgeries / Procedures:  - 3/21 EGD: LA grade D esophagitis with contact bleeding from incisors. Scattered plaques consistent with candidiasis  Central access:  PICC placed 3/22 for TPN TPN start date: 3/22   Nutritional Goals (per RD recommendation on 3/20): Kcal:  1450-1750 Protein:  73-88 Fluid:  >/= 1.4 L/day  Goal TPN rate is 60 mL/hr (provides 81 g of protein and 1636 kcals per day)  Current Nutrition:  - Full liquid diet - Ensure ordered BID, Magic Cup BID with meals - TPN  Plan: Now:  Magnesium 2 gm IV x1  At 1800:  continue TPN at goal rate of 60 mL/hr to provide ~ 81 gm  protein and 1636 Kcals for 100% support - Electrolytes in TPN: 77mEq/L - Na, 31mEq/L - K, 87mEq/L - Ca, 15 mEq/L - Mg, 48mmol/L - Phos. - Cl:Ac ratio, continue max Chloride (TPN program will use more Cl if we maximize, unable to calc 2:1) - Add trace elements to TPN; MVI on MWF only due to national backorder - DC SSI & CBGs - continue MIVF at 10 ml/hr per MD - Full TPN labs on Mon/Thurs, checking labs daily with low Mag  Eudelia Bunch, Pharm.D [phone] 10/12/2019 7:57 AM

## 2019-10-13 DIAGNOSIS — J449 Chronic obstructive pulmonary disease, unspecified: Secondary | ICD-10-CM

## 2019-10-13 LAB — BASIC METABOLIC PANEL
Anion gap: 6 (ref 5–15)
BUN: 17 mg/dL (ref 8–23)
CO2: 27 mmol/L (ref 22–32)
Calcium: 8.9 mg/dL (ref 8.9–10.3)
Chloride: 104 mmol/L (ref 98–111)
Creatinine, Ser: 0.35 mg/dL — ABNORMAL LOW (ref 0.44–1.00)
GFR calc Af Amer: >60 mL/min (ref >60)
GFR calc non Af Amer: >60 mL/min (ref >60)
Glucose, Bld: 128 mg/dL — ABNORMAL HIGH (ref 70–99)
Potassium: 4.3 mmol/L (ref 3.5–5.1)
Sodium: 137 mmol/L (ref 135–145)

## 2019-10-13 LAB — GASTRIN: Gastrin: 261 pg/mL — ABNORMAL HIGH (ref 0–115)

## 2019-10-13 LAB — PHOSPHORUS: Phosphorus: 3.3 mg/dL (ref 2.5–4.6)

## 2019-10-13 LAB — MAGNESIUM: Magnesium: 1.5 mg/dL — ABNORMAL LOW (ref 1.7–2.4)

## 2019-10-13 LAB — GLUCOSE, CAPILLARY: Glucose-Capillary: 135 mg/dL — ABNORMAL HIGH (ref 70–99)

## 2019-10-13 MED ORDER — TRAVASOL 10 % IV SOLN
INTRAVENOUS | Status: AC
  Filled 2019-10-13: qty 806.4

## 2019-10-13 MED ORDER — TRAVASOL 10 % IV SOLN
INTRAVENOUS | Status: DC
  Filled 2019-10-13: qty 806.4

## 2019-10-13 MED ORDER — MAGNESIUM SULFATE 4 GM/100ML IV SOLN
4.0000 g | Freq: Once | INTRAVENOUS | Status: AC
  Administered 2019-10-13: 4 g via INTRAVENOUS
  Filled 2019-10-13: qty 100

## 2019-10-13 MED ORDER — MAGNESIUM SULFATE 2 GM/50ML IV SOLN
2.0000 g | Freq: Once | INTRAVENOUS | Status: AC
  Administered 2019-10-13: 2 g via INTRAVENOUS
  Filled 2019-10-13: qty 50

## 2019-10-13 NOTE — Progress Notes (Signed)
PROGRESS NOTE    Claudia Wiley  [id number] DOB: [birth year] DOA: 10/01/2019 PCP: [facility]   Brief Narrative: Claudia Wiley is a 64 y.o. female with a history COPD/chronic respiratory failure with hypoxia on 2 L supplemental O2 via [place], history of lung cancer s/p LUL lobectomy, gastrinoma s/p Whipple's procedure, anxiety/depression, esophageal stricture and dilatation July 2019, hypertension, and recent admission for severe Candida esophagitis with esophageal necrosis. She presented secondary to dehydration secondary to recurrent emesis and lack of intake secondary to odynophagia.   Assessment & Plan:   Principal Problem:   Candida esophagitis (HCC) Active Problems:   Unspecified protein-calorie malnutrition ([place])   Essential hypertension   Pancreatic insufficiency   COPD (chronic obstructive pulmonary disease) (HCC)   Esophagitis   Esophageal stricture   Orthostasis   Protein-calorie malnutrition, severe   Candida esophagitis Odynophagia Duodenal ulcers Severe malnutrition Patient previous treated with full course of fluconazole. Concern for possible reoccurrence of candidal esophagitis. Patient is very symptomatic with severe pain with swallowing. GI consulted and performed EGD on 3/21. TPN started. Patient is now tolerating some liquids by mouth if thinned out -GI recommendations: defer EGD for now, continue diet -Continue Fluconazole IV -Full liquid as tolerated -Continue TPN until patient able to adequately support her nutritional needs by mouth  Acute respiratory failure with hypercapnia Patient with a pH of 7.251 initially and pCO2 of 63.5. Patient placed on bipap with improved pH of 7.309 and pCO2 of 51.6 after 90 minutes. Patient is baseline. Resolved.  Acute toxic encephalopathy Secondary to Iatrogenic. Patient was tolerating narcotic regimen but acutely worsened on 3/21 with increased lethargy. Dilaudid discontinued. Toradol used prn and now  discontinued -Tylenol for pain -Oxycodone as needed  Orthostatic hypotension Secondary to dehydration. Resolved.  Dehydration Secondary to inability to maintain oral intake.  Anasarca Likely secondary to hypoalbuminemia and poor nutritional status. Recent Transthoracic Echocardiogram significant for normal functioning heart. On previous admission, also had fluid retention possibly leading to respiratory failure requiring Lasix.  -Daily weights/strict in and out  COPD Chronic respiratory failure with hypoxia Baseline uses 2L via nasal canula. Some wheezing today -Continue 2L O2 via [place]  -Dulera, Incruse Ellipta -Albuterol   DVT prophylaxis: Lovenox Code Status:   Code Status: Full Code Family Communication:  Daughter and granddaughter at bedside Disposition Plan: Discharge pending improvement of odynophagia, transition back to oral diet   Consultants:   [facility]  Procedures:   None  Antimicrobials:  Fluconazole    Subjective: No issues this morning.  Objective: Vitals:   10/12/19 1950 10/12/19 2044 10/13/19 0835 10/13/19 1327  BP:  116/64  111/69  Pulse:  87  83  Resp:  16  15  Temp:  98.6 F (37 C)  98.4 F (36.9 C)  TempSrc:  Oral  Oral  SpO2: 99% 100% 99% 100%  Weight:      Height:        Intake/Output Summary (Last 24 hours) at 10/13/2019 1522 Last data filed at 10/13/2019 1453 Gross per 24 hour  Intake 250 ml  Output 1350 ml  Net -1100 ml   Filed Weights   10/10/19 1800 10/11/19 0604 10/12/19 0609  Weight: 35.4 kg 34.8 kg 34.7 kg    Examination:  General exam: Appears calm and comfortable. Thin. Respiratory system: Clear to auscultation. Respiratory effort normal. Cardiovascular system: S1 & S2 heard, RRR. No murmurs, rubs, gallops or clicks. Gastrointestinal system: Abdomen is nondistended, soft and nontender. No organomegaly or masses felt. Normal bowel sounds  heard. Central nervous system: Alert and oriented. No focal neurological  deficits. Extremities: No edema. No calf tenderness Skin: No cyanosis. No rashes Psychiatry: Judgement and insight appear normal. Mood & affect appropriate.     Data Reviewed: I have personally reviewed following labs and imaging studies  CBC: Recent Labs  Lab 10/11/19 0321  WBC 5.2  NEUTROABS 2.1  HGB 9.9*  HCT 32.3*  MCV 95.0  PLT [id number]   Basic Metabolic Panel: Recent Labs  Lab 10/09/19 0850 10/10/19 0331 10/11/19 0321 10/12/19 0353 10/13/19 0800  NA 140 141 140 140 137  K 4.8 4.6 4.2 4.6 4.3  CL 97* 103 105 108 104  CO2 38* 32 30 28 27   GLUCOSE 113* 131* 113* 126* 128*  BUN 6* 8 11 16 17   CREATININE 0.31* 0.33* 0.33* 0.32* 0.35*  CALCIUM 8.8* 8.9 8.7* 8.4* 8.9  MG 1.5* 1.6* 1.6* 1.9 1.5*  PHOS 3.3 2.7 3.1 3.3 3.3   GFR: Estimated Creatinine Clearance: 40.5 mL/min (A) (by C-G formula based on SCr of 0.35 mg/dL (L)). Liver Function Tests: Recent Labs  Lab 10/07/19 0415 10/11/19 0321  AST 18 31  ALT 14 26  ALKPHOS 57 71  BILITOT 0.1* 0.4  PROT 4.5* 5.6*  ALBUMIN 2.2* 2.7*   No results for input(s): LIPASE, AMYLASE in the last 168 hours. No results for input(s): AMMONIA in the last 168 hours. Coagulation Profile: No results for input(s): INR, PROTIME in the last 168 hours. Cardiac Enzymes: No results for input(s): CKTOTAL, CKMB, CKMBINDEX, TROPONINI in the last 168 hours. BNP (last 3 results) No results for input(s): PROBNP in the last 8760 hours. HbA1C: No results for input(s): HGBA1C in the last 72 hours. CBG: Recent Labs  Lab 10/11/19 1155 10/11/19 1845 10/12/19 0008 10/12/19 0607 10/13/19 0646  GLUCAP 135* 140* 128* 116* 135*   Lipid Profile: Recent Labs    10/11/19 0321  TRIG 99   Thyroid Function Tests: No results for input(s): TSH, T4TOTAL, FREET4, T3FREE, THYROIDAB in the last 72 hours. Anemia Panel: No results for input(s): VITAMINB12, FOLATE, FERRITIN, TIBC, IRON, RETICCTPCT in the last 72 hours. Sepsis Labs: No results for  input(s): PROCALCITON, LATICACIDVEN in the last 168 hours.  No results found for this or any previous visit (from the past 240 hour(s)).       Radiology Studies: DG ESOPHAGUS W SINGLE CM (SOL OR THIN BA)  Result Date: 10/12/2019 CLINICAL DATA:  Follow-up additional history obtained from the ordering provider: Esophageal necrosis with known stricture seen on recent EGD EXAM: ESOPHOGRAM/BARIUM SWALLOW TECHNIQUE: Single contrast examination was performed using thin barium. FLUOROSCOPY TIME:  Fluoroscopy Time:  1 minutes, 24 seconds Radiation Exposure Index (if provided by the fluoroscopic device): 3.9 mGY Number of Acquired Spot Images: 2 COMPARISON:  Esophagram 10/20/2011, CT chest 10/03/2019 FINDINGS: A thin barium single contrast esophagram was performed. Due to significant discomfort, the patient was unable to drink contrast at a rate that would adequately distend the mid to distal esophagus. This resulted in a significantly limited examination and precluded adequate evaluation for stenosis within the mid to distal esophagus. No high-grade fixed stricture was demonstrated within the proximal to mid esophagus. There was no appreciable mucosal abnormality, although evaluation was significantly limited on this single contrast exam. Small sliding hiatal hernia. No reflux was observed with the patient in the standing position. IMPRESSION: Due to significant discomfort, the patient was unable to drink contrast at a rate that would adequately distend the mid to distal esophagus. This  resulted in a significantly limited examination and precluded adequate evaluation for stenosis within the mid to distal esophagus. No high-grade fixed stricture demonstrated within the proximal to mid esophagus Small sliding hiatal hernia. Electronically Signed   By: Kellie Simmering DO   On: 10/12/2019 15:23        Scheduled Meds: . Chlorhexidine Gluconate Cloth  6 each Topical Daily  . enoxaparin (LOVENOX) injection  30 mg  Subcutaneous Daily  . feeding supplement (ENSURE ENLIVE)  237 mL Oral BID BM  . mouth rinse  15 mL Mouth Rinse BID  . mometasone-formoterol  2 puff Inhalation BID  . pantoprazole (PROTONIX) IV  40 mg Intravenous Q12H  . sodium chloride flush  10-40 mL Intracatheter Q12H  . sodium chloride flush  3 mL Intravenous Q12H  . sucralfate  1 g Oral TID WC & HS  . umeclidinium bromide  1 puff Inhalation Daily   Continuous Infusions: . sodium chloride Stopped (10/11/19 0916)  . fluconazole (DIFLUCAN) IV 400 mg (10/13/19 1454)  . magnesium sulfate bolus IVPB    . TPN ADULT (ION) 60 mL/hr at 10/12/19 1747  . TPN ADULT (ION)       LOS: 11 days     Cordelia Poche, MD [facility] 10/13/2019, 3:22 PM  If 7PM-7AM, please contact night-coverage [email or web address]

## 2019-10-13 NOTE — Progress Notes (Signed)
PT Cancellation Note  Patient Details Name: Claudia Wiley MRN: [id number] DOB: 06-01-1956   Cancelled Treatment:    Reason Eval/Treat Not Completed: Patient declined, no reason specified. Pt reports she has been up and down all night transferring to [facility] for urination. Pt declines ambulation, therapeutic exercises, or transferring to bedside chair despite encouragement from therapist to improve strength and activity tolerance. Will continue to follow.   Talbot Grumbling PT, DPT 10/13/19, 9:39 AM [phone]

## 2019-10-13 NOTE — Progress Notes (Signed)
PHARMACY - TOTAL PARENTERAL NUTRITION CONSULT NOTE   Indication: Intolerance to enteral feeding  Patient Measurements: Height: 4' 11.02" (149.9 cm) Weight: 76 lb 6.4 oz (34.7 kg) IBW/kg (Calculated) : 43.24 TPN AdjBW (KG): 34.4 Body mass index is 15.42 kg/m. Usual Weight:   Assessment: [name] with hx esophageal stricture (s/p dilation in July 2019) who was hospitalized from 2/25-3/7 for severe esophagitis and odynophagia as well as duodenal ulcers.  She presented back to the ED on 3/19 with c/o SOB and esophageal pain. EGD on 3/21 showed severe esophagitis with ulceration and esophageal stenosis. GI team recommends to avoid "NG tube placement given recent esophageal injury" and  start TPN for nutrition.  Glucose / Insulin: no hx DM, SSI discontinued Electrolytes: Na, K, Phos, Cl, CO2 wnl, corrCa 9.04, Mag 1.5  (goal>2), (maxed Cl in TPN formula) Renal: SCr WNL LFTs / TGs: LFTs wnl, Trig 99 (3/29) Prealbumin / albumin:  - albumin up to 2.7, prealbumin  8.5 (3/22), 12.4 (3/29) Intake / Output; MIVF: - NS at 10 ml/hr - UOP 450 mls/24 hrs  Imaging: Chest CT 3/21: mild, diffuse esophageal wall thickening, prob esophagitis, no free air, small R pleural effusion, mod emphysematous changes Surgeries / Procedures:  - 3/21 EGD: LA grade D esophagitis with contact bleeding from incisors. Scattered plaques consistent with candidiasis  Central access:  PICC placed 3/22 for TPN TPN start date: 3/22   Nutritional Goals (per RD recommendation on 3/20): Kcal:  1450-1750 Protein:  73-88 Fluid:  >/= 1.4 L/day  Goal TPN rate is 60 mL/hr (provides 81 g of protein and 1636 kcals per day)  Current Nutrition:  - Full liquid diet - Ensure ordered BID, Magic Cup BID with meals - TPN  Plan: Now:  Magnesium 4 gm IV x1  At 1800:  continue TPN at goal rate of 60 mL/hr to provide ~ 81 gm protein and 1636 Kcals for 100% support - Electrolytes in TPN: 46mEq/L - Na, 44mEq/L - K, 2mEq/L -  Ca, 15 mEq/L - Mg, 6mmol/L - Phos. - Cl:Ac ratio, continue max Chloride (TPN program will use more Cl if we maximize, unable to calc 2:1) - Add trace elements to TPN; MVI on MWF only due to national backorder - continue MIVF at 10 ml/hr per MD - Full TPN labs on Mon/Thurs, checking labs daily with low Raul Del, Rona Ravens PharmD 10/13/2019, 9:16 AM

## 2019-10-13 NOTE — Progress Notes (Addendum)
Progress Note    ASSESSMENT AND PLAN:   # Recent esophageal necrosis / esophageal stricture / candida esophagitis / ongoing dysphagia / odynophagia.IMPROVING. --Barium swallow yesterday >>>limited exam. Due to discomfort she couldn't drink contrast at adequate rate to distend the mid to distal esophagus to evaluate for stenosis. No high grade fixed stricture demonstrated within the proximal to mid esophagus.  --Patient says yesterday was first day that she was able to consume a whole bowl of cream of potato soup and ice cream. Can drink Ensure as long as it is watered down. Given improvement I think plan is to hold off on repeating EGD right now but will still likely need EGD with dilation in the future.   --On day 10 of fluconazole. Dose increase yesterday ( possibly reason for improvement?) --On BID IV PPI --Taking Carafate suspension TID and HS --Still receiving TNA --Gastrin level pending     [facility] Attending   I have taken an interval history, reviewed the chart and examined the patient. I agree with the Advanced Practitioner's note, impression and recommendations.    She is improving   Rec:  21 days fluconazole When calorie intake adequate off TPN dc that Office visit Dr. Fuller Plan  May 4  150 PM is set up Continue bid PPI  I am signing off now but available to return prn  Gatha Mayer, MD, [facility] 10/13/2019 3:36 PM        SUBJECTIVE   Says swallowing is better. Was able to tolerate a whole bowl of creamy potato soup and ice yesterday.     OBJECTIVE:     Vital signs in last 24 hours: Temp:  [98.6 F (37 C)-98.8 F (37.1 C)] 98.6 F (37 C) (03/30 2044) Pulse Rate:  [87] 87 (03/30 2044) Resp:  [14-16] 16 (03/30 2044) BP: (116-126)/(64-67) 116/64 (03/30 2044) SpO2:  [99 %-100 %] 99 % (03/31 0835) Last BM Date: 10/09/19 General:   Alert, thin female in NAD EENT:  Normal hearing, non icteric sclera   Heart:  Regular rate  and rhythm;  No lower extremity edema   Pulm: Normal respiratory effort   Abdomen:  Soft, nondistended, nontender.  Normal bowel sounds.          Neurologic:  Alert and  oriented x4;  grossly normal neurologically. Psych:  Pleasant, cooperative.  Normal mood and affect.    Lab Results: Recent Labs    10/11/19 0321  WBC 5.2  HGB 9.9*  HCT 32.3*  PLT 303     DG ESOPHAGUS W SINGLE CM (SOL OR THIN BA)  Result Date: 10/12/2019 CLINICAL DATA:  Follow-up additional history obtained from the ordering provider: Esophageal necrosis with known stricture seen on recent EGD EXAM: ESOPHOGRAM/BARIUM SWALLOW TECHNIQUE: Single contrast examination was performed using thin barium. FLUOROSCOPY TIME:  Fluoroscopy Time:  1 minutes, 24 seconds Radiation Exposure Index (if provided by the fluoroscopic device): 3.9 mGY Number of Acquired Spot Images: 2 COMPARISON:  Esophagram 10/20/2011, CT chest 10/03/2019 FINDINGS: A thin barium single contrast esophagram was performed. Due to significant discomfort, the patient was unable to drink contrast at a rate that would adequately distend the mid to distal esophagus. This resulted in a significantly limited examination and precluded adequate evaluation for stenosis within the mid to distal esophagus. No high-grade fixed stricture was demonstrated within the proximal to mid esophagus. There was no appreciable mucosal abnormality, although evaluation was significantly limited on this single contrast exam. Small sliding hiatal  hernia. No reflux was observed with the patient in the standing position. IMPRESSION: Due to significant discomfort, the patient was unable to drink contrast at a rate that would adequately distend the mid to distal esophagus. This resulted in a significantly limited examination and precluded adequate evaluation for stenosis within the mid to distal esophagus. No high-grade fixed stricture demonstrated within the proximal to mid esophagus Small sliding  hiatal hernia. Electronically Signed   By: Kellie Simmering DO   On: 10/12/2019 15:23         LOS: 11 days   Tye Savoy ,NP 10/13/2019, 9:36 AM

## 2019-10-14 LAB — MAGNESIUM: Magnesium: 2.1 mg/dL (ref 1.7–2.4)

## 2019-10-14 LAB — COMPREHENSIVE METABOLIC PANEL
ALT: 72 U/L — ABNORMAL HIGH (ref 0–44)
AST: 64 U/L — ABNORMAL HIGH (ref 15–41)
Albumin: 3.3 g/dL — ABNORMAL LOW (ref 3.5–5.0)
Alkaline Phosphatase: 86 U/L (ref 38–126)
Anion gap: 5 (ref 5–15)
BUN: 19 mg/dL (ref 8–23)
CO2: 28 mmol/L (ref 22–32)
Calcium: 8.9 mg/dL (ref 8.9–10.3)
Chloride: 105 mmol/L (ref 98–111)
Creatinine, Ser: 0.4 mg/dL — ABNORMAL LOW (ref 0.44–1.00)
GFR calc Af Amer: >60 mL/min (ref >60)
GFR calc non Af Amer: >60 mL/min (ref >60)
Glucose, Bld: 123 mg/dL — ABNORMAL HIGH (ref 70–99)
Potassium: 4.7 mmol/L (ref 3.5–5.1)
Sodium: 138 mmol/L (ref 135–145)
Total Bilirubin: 0.4 mg/dL (ref 0.3–1.2)
Total Protein: 6.3 g/dL — ABNORMAL LOW (ref 6.5–8.1)

## 2019-10-14 LAB — PHOSPHORUS: Phosphorus: 3.1 mg/dL (ref 2.5–4.6)

## 2019-10-14 MED ORDER — TRAVASOL 10 % IV SOLN
INTRAVENOUS | Status: AC
  Filled 2019-10-14: qty 806.4

## 2019-10-14 NOTE — Progress Notes (Signed)
PROGRESS NOTE    Claudia Wiley  [id number] DOB: 10-Jul-1956 DOA: 10/01/2019 PCP: [facility]   Brief Narrative: Claudia Wiley is a 64 y.o. female with a history COPD/chronic respiratory failure with hypoxia on 2 L supplemental O2 via [place], history of lung cancer s/p LUL lobectomy, gastrinoma s/p Whipple's procedure, anxiety/depression, esophageal stricture and dilatation July 2019, hypertension, and recent admission for severe Candida esophagitis with esophageal necrosis. She presented secondary to dehydration secondary to recurrent emesis and lack of intake secondary to odynophagia.   Assessment & Plan:   Principal Problem:   Candida esophagitis (HCC) Active Problems:   Unspecified protein-calorie malnutrition ([place])   Essential hypertension   Pancreatic insufficiency   COPD (chronic obstructive pulmonary disease) (HCC)   Esophagitis   Esophageal stricture   Orthostasis   Protein-calorie malnutrition, severe   Candida esophagitis Odynophagia Duodenal ulcers Severe malnutrition Patient previous treated with full course of fluconazole. Concern for possible reoccurrence of candidal esophagitis. Patient is very symptomatic with severe pain with swallowing. GI consulted and performed EGD on 3/21. TPN started. Patient is now tolerating some liquids by mouth if thinned out -GI recommendations: EGD defered, continue diet -Continue Fluconazole IV -Full liquid as tolerated -Continue TPN until patient able to adequately support her nutritional needs by mouth  Acute respiratory failure with hypercapnia Patient with a pH of 7.251 initially and pCO2 of 63.5. Patient placed on bipap with improved pH of 7.309 and pCO2 of 51.6 after 90 minutes. Patient is baseline. Resolved.  Acute toxic encephalopathy Iatrogenic. Patient was tolerating narcotic regimen but acutely worsened on 3/21 with increased lethargy. Dilaudid discontinued. Toradol used prn and now discontinued -Tylenol for  pain -Oxycodone as needed  Orthostatic hypotension Secondary to dehydration. Resolved.  Dehydration Secondary to inability to maintain oral intake.  Anasarca Likely secondary to hypoalbuminemia and poor nutritional status. Recent Transthoracic Echocardiogram significant for normal functioning heart. On previous admission, also had fluid retention possibly leading to respiratory failure requiring Lasix.  -Daily weights/strict in and out  COPD Chronic respiratory failure with hypoxia Baseline uses 2L via nasal canula. Some wheezing today -Continue 2L O2 via [place]  -Dulera, Incruse Ellipta -Albuterol   DVT prophylaxis: Lovenox Code Status:   Code Status: Full Code Family Communication:  Daughter on telephone Disposition Plan: Discharge pending improvement of odynophagia, transition back to oral diet   Consultants:   [facility]  Procedures:   None  Antimicrobials:  Fluconazole    Subjective: No concerns other than wanting to go home.  Objective: Vitals:   10/13/19 1327 10/13/19 2007 10/13/19 2058 10/14/19 0748  BP: 111/69  112/66   Pulse: 83  84   Resp: 15  16   Temp: 98.4 F (36.9 C)  98.7 F (37.1 C)   TempSrc: Oral  Oral   SpO2: 100% 100% 98% 99%  Weight:      Height:        Intake/Output Summary (Last 24 hours) at 10/14/2019 1309 Last data filed at 10/14/2019 1219 Gross per 24 hour  Intake 2432.24 ml  Output 1050 ml  Net 1382.24 ml   Filed Weights   10/10/19 1800 10/11/19 0604 10/12/19 0609  Weight: 35.4 kg 34.8 kg 34.7 kg    Examination:  General exam: Appears calm and comfortable Respiratory system: Clear to auscultation. Respiratory effort normal. Cardiovascular system: S1 & S2 heard, RRR. No murmurs, rubs, gallops or clicks. Gastrointestinal system: Abdomen is nondistended, soft and epigastric pain. No organomegaly or masses felt. Normal bowel sounds heard. Central  nervous system: Alert and oriented. No focal neurological  deficits. Extremities: No edema. No calf tenderness Skin: No cyanosis. No rashes Psychiatry: Judgement and insight appear normal. Mood & affect appropriate.     Data Reviewed: I have personally reviewed following labs and imaging studies  CBC: Recent Labs  Lab 10/11/19 0321  WBC 5.2  NEUTROABS 2.1  HGB 9.9*  HCT 32.3*  MCV 95.0  PLT [id number]   Basic Metabolic Panel: Recent Labs  Lab 10/10/19 0331 10/11/19 0321 10/12/19 0353 10/13/19 0800 10/14/19 0303  NA 141 140 140 137 138  K 4.6 4.2 4.6 4.3 4.7  CL 103 105 108 104 105  CO2 32 30 28 27 28   GLUCOSE 131* 113* 126* 128* 123*  BUN 8 11 16 17 19   CREATININE 0.33* 0.33* 0.32* 0.35* 0.40*  CALCIUM 8.9 8.7* 8.4* 8.9 8.9  MG 1.6* 1.6* 1.9 1.5* 2.1  PHOS 2.7 3.1 3.3 3.3 3.1   GFR: Estimated Creatinine Clearance: 40.5 mL/min (A) (by C-G formula based on SCr of 0.4 mg/dL (L)). Liver Function Tests: Recent Labs  Lab 10/11/19 0321 10/14/19 0303  AST 31 64*  ALT 26 72*  ALKPHOS 71 86  BILITOT 0.4 0.4  PROT 5.6* 6.3*  ALBUMIN 2.7* 3.3*   No results for input(s): LIPASE, AMYLASE in the last 168 hours. No results for input(s): AMMONIA in the last 168 hours. Coagulation Profile: No results for input(s): INR, PROTIME in the last 168 hours. Cardiac Enzymes: No results for input(s): CKTOTAL, CKMB, CKMBINDEX, TROPONINI in the last 168 hours. BNP (last 3 results) No results for input(s): PROBNP in the last 8760 hours. HbA1C: No results for input(s): HGBA1C in the last 72 hours. CBG: Recent Labs  Lab 10/11/19 1155 10/11/19 1845 10/12/19 0008 10/12/19 0607 10/13/19 0646  GLUCAP 135* 140* 128* 116* 135*   Lipid Profile: No results for input(s): CHOL, HDL, LDLCALC, TRIG, CHOLHDL, LDLDIRECT in the last 72 hours. Thyroid Function Tests: No results for input(s): TSH, T4TOTAL, FREET4, T3FREE, THYROIDAB in the last 72 hours. Anemia Panel: No results for input(s): VITAMINB12, FOLATE, FERRITIN, TIBC, IRON, RETICCTPCT in the  last 72 hours. Sepsis Labs: No results for input(s): PROCALCITON, LATICACIDVEN in the last 168 hours.  No results found for this or any previous visit (from the past 240 hour(s)).       Radiology Studies: DG ESOPHAGUS W SINGLE CM (SOL OR THIN BA)  Result Date: 10/12/2019 CLINICAL DATA:  Follow-up additional history obtained from the ordering provider: Esophageal necrosis with known stricture seen on recent EGD EXAM: ESOPHOGRAM/BARIUM SWALLOW TECHNIQUE: Single contrast examination was performed using thin barium. FLUOROSCOPY TIME:  Fluoroscopy Time:  1 minutes, 24 seconds Radiation Exposure Index (if provided by the fluoroscopic device): 3.9 mGY Number of Acquired Spot Images: 2 COMPARISON:  Esophagram 10/20/2011, CT chest 10/03/2019 FINDINGS: A thin barium single contrast esophagram was performed. Due to significant discomfort, the patient was unable to drink contrast at a rate that would adequately distend the mid to distal esophagus. This resulted in a significantly limited examination and precluded adequate evaluation for stenosis within the mid to distal esophagus. No high-grade fixed stricture was demonstrated within the proximal to mid esophagus. There was no appreciable mucosal abnormality, although evaluation was significantly limited on this single contrast exam. Small sliding hiatal hernia. No reflux was observed with the patient in the standing position. IMPRESSION: Due to significant discomfort, the patient was unable to drink contrast at a rate that would adequately distend the mid to distal esophagus.  This resulted in a significantly limited examination and precluded adequate evaluation for stenosis within the mid to distal esophagus. No high-grade fixed stricture demonstrated within the proximal to mid esophagus Small sliding hiatal hernia. Electronically Signed   By: Kellie Simmering DO   On: 10/12/2019 15:23        Scheduled Meds: . Chlorhexidine Gluconate Cloth  6 each Topical  Daily  . enoxaparin (LOVENOX) injection  30 mg Subcutaneous Daily  . feeding supplement (ENSURE ENLIVE)  237 mL Oral BID BM  . mouth rinse  15 mL Mouth Rinse BID  . mometasone-formoterol  2 puff Inhalation BID  . pantoprazole (PROTONIX) IV  40 mg Intravenous Q12H  . sodium chloride flush  10-40 mL Intracatheter Q12H  . sodium chloride flush  3 mL Intravenous Q12H  . sucralfate  1 g Oral TID WC & HS  . umeclidinium bromide  1 puff Inhalation Daily   Continuous Infusions: . fluconazole (DIFLUCAN) IV 400 mg (10/14/19 1128)  . TPN ADULT (ION) 60 mL/hr at 10/14/19 1219  . TPN ADULT (ION)       LOS: 12 days     Cordelia Poche, MD [facility] 10/14/2019, 1:09 PM  If 7PM-7AM, please contact night-coverage [email or web address]

## 2019-10-14 NOTE — Progress Notes (Signed)
Physical Therapy Treatment Patient Details Name: Claudia Wiley MRN: [id number] DOB: 05-09-1956 Today's Date: 10/14/2019    History of Present Illness 64 y.o. female past medical history of COPD on home O2 history of lung cancer status post left upper lobe lobectomy, gastrinoma status post Whipple procedure esophageal stricture and dilation in July 2019 with a recent admission for severe Candida esophagitis and esophageal necrosis presents secondary to dehydration in the setting of recurrent emesis and decreased oral intake secondary to odynophagia    PT Comments    Patient making steady progress with therapy and increased gait distance today while maintaining SpO2 of 97% or greater on 2L/min. She continues to require cues for posture and for visual scanning for safety while ambulating in hallway. Pt reported some SOB with gait and educated on pursed lip breathing. She completed functional activities in standing including washing her face and exchanging water in a flower vase. She will continue to benefit from skilled PT interventions in acute setting and from HHPT follow up. Will continue to progress as able.   Follow Up Recommendations  Home health PT;Supervision for mobility/OOB     Equipment Recommendations  None recommended by PT    Recommendations for Other Services       Precautions / Restrictions Precautions Precautions: Fall Restrictions Weight Bearing Restrictions: No    Mobility  Bed Mobility Overal bed mobility: Needs Assistance Bed Mobility: Supine to Sit     Supine to sit: Supervision;HOB elevated     General bed mobility comments: pt requried increased time and HOB elevated, no assist needed.  Transfers Overall transfer level: Needs assistance Equipment used: Rolling walker (2 wheeled) Transfers: Sit to/from [facility] Sit to Stand: Supervision Stand pivot transfers: Supervision       General transfer comment: cues for hand  placement/technique to rise to RW, no assist requried for power up, pt steady in standing. pt maintained safe proximity to RW wtih stand step transfer and cues.  Ambulation/Gait Ambulation/Gait assistance: Supervision;Min guard Gait Distance (Feet): 90 Feet Assistive device: Rolling walker (2 wheeled) Gait Pattern/deviations: Step-through pattern;Decreased stride length Gait velocity: decreased   General Gait Details: min guard/close supervision for safety during gait, no overt LOB noted, pt HR reachign 110's and SpO2 remained 97% or greater. Pt with slightly flexed posture requiring cues to look up for visual scanning and to improve posture. pt also with decreased hip/knee flexion on bil LE's with LE advancement.   Stairs             Wheelchair Mobility    Modified Rankin (Stroke Patients Only)       Balance Overall balance assessment: Needs assistance Sitting-balance support: No upper extremity supported;Feet supported Sitting balance-Leahy Scale: Good     Standing balance support: No upper extremity supported Standing balance-Leahy Scale: Fair Standing balance comment: pt able to maintain static standing at room sink to wash face with warm cloth and to change water in vase. therapist holding flowers while pt dumped the water, rinsed the vase, and refilled it with cold water for the flowers.          Cognition Arousal/Alertness: Awake/alert Behavior During Therapy: WFL for tasks assessed/performed Overall Cognitive Status: Within Functional Limits for tasks assessed         Exercises      General Comments        Pertinent Vitals/Pain Pain Assessment: No/denies pain           PT Goals (current goals can now be found  in the care plan section) Acute Rehab PT Goals PT Goal Formulation: With patient Time For Goal Achievement: 10/21/19 Potential to Achieve Goals: Good Progress towards PT goals: Progressing toward goals    Frequency    Min  3X/week      PT Plan Current plan remains appropriate    Co-evaluation              AM-PAC PT "6 Clicks" Mobility   Outcome Measure  Help needed turning from your back to your side while in a flat bed without using bedrails?: None Help needed moving from lying on your back to sitting on the side of a flat bed without using bedrails?: None Help needed moving to and from a bed to a chair (including a wheelchair)?: None Help needed standing up from a chair using your arms (e.g., wheelchair or bedside chair)?: A Little Help needed to walk in hospital room?: A Little Help needed climbing 3-5 steps with a railing? : A Little 6 Click Score: 21    End of Session Equipment Utilized During Treatment: Oxygen Activity Tolerance: Patient tolerated treatment well Patient left: in chair;with call bell/phone within reach Nurse Communication: Mobility status PT Visit Diagnosis: Unsteadiness on feet (R26.81);Difficulty in walking, not elsewhere classified (R26.2)     Time: [id number] PT Time Calculation (min) (ACUTE ONLY): 18 min  Charges:  $Gait Training: 8-22 mins                     Verner Mould, DPT Physical Therapist with [facility] [phone]  10/14/2019 1:50 PM

## 2019-10-14 NOTE — TOC Progression Note (Signed)
Transition of Care [facility]) - Progression Note    Patient Details  Name: Claudia Wiley MRN: [id number] Date of Birth: 09-19-1955  Transition of [facility] CM/SW Contact  Ross Ludwig, [place] Phone Number: 10/14/2019, 5:48 PM  Clinical Narrative:    CSW continuing to follow patient's progress throughout discharge planning.  Patient is active with [facility] for home health.   Expected Discharge Plan: [place] Barriers to Discharge: Continued Medical Work up  Expected Discharge Plan and Services Expected Discharge Plan: [place] In-house Referral: Clinical Social Work     Living arrangements for the past 2 months: Single Family Home                                       Social Determinants of Health (SDOH) Interventions    Readmission Risk Interventions Readmission Risk Prevention Plan 10/05/2019  Transportation Screening Complete  PCP or Specialist Appt within 3-5 Days Complete  HRI or [place] Not Complete  Social Work Consult for [place] Planning/Counseling Not Complete  Palliative Care Screening Not Applicable  Medication Review [employer]) Complete  Some recent data might be hidden

## 2019-10-14 NOTE — Progress Notes (Addendum)
PHARMACY - TOTAL PARENTERAL NUTRITION CONSULT NOTE   Indication: Intolerance to enteral feeding  Patient Measurements: Height: 4' 11.02" (149.9 cm) Weight: 76 lb 6.4 oz (34.7 kg) IBW/kg (Calculated) : 43.24 TPN AdjBW (KG): 34.4 Body mass index is 15.42 kg/m. Usual Weight:   Assessment: [name] with hx esophageal stricture (s/p dilation in July 2019) who was hospitalized from 2/25-3/7 for severe esophagitis and odynophagia as well as duodenal ulcers.  She presented back to the ED on 3/19 with c/o SOB and esophageal pain. EGD on 3/21 showed severe esophagitis with ulceration and esophageal stenosis. GI team recommends to avoid "NG tube placement given recent esophageal injury" and  start TPN for nutrition.  Glucose / Insulin: no hx DM, SSI discontinued Electrolytes: Na, K, Phos, Cl, CO2 wnl, corrCa 9.46, Mag up to 2.1 after 6 gm bolus yesterday (goal>2), (maxed Cl in TPN formula) Renal: SCr WNL LFTs / TGs: AST/ALT slightly elevated 4/1, Trig 99 (3/29) Prealbumin / albumin:  - albumin up to 3.3, prealbumin  8.5 (3/22), 12.4 (3/29) Intake / Output; MIVF: - NS at 10 ml/hr stopped by MD 3/29 - UOP 1450 mls/24 hrs  Imaging: Chest CT 3/21: mild, diffuse esophageal wall thickening, prob esophagitis, no free air, small R pleural effusion, mod emphysematous changes Surgeries / Procedures:  - 3/21 EGD: LA grade D esophagitis with contact bleeding from incisors. Scattered plaques consistent with candidiasis 3/30 barium swallow: No high grade fixed stricture demonstrated within the proximal to mid esophagus. plan is to hold off on repeating EGD right now but will still likely need EGD with dilation in the future  Central access:  PICC placed 3/22 for TPN TPN start date: 3/22   Nutritional Goals (per RD recommendation on 3/20): Kcal:  1450-1750 Protein:  73-88 Fluid:  >/= 1.4 L/day  Goal TPN rate is 60 mL/hr (provides 81 g of protein and 1636 kcals per day)  Current Nutrition:   - Full liquid diet - Ensure ordered BID, Magic Cup BID with meals - TPN  Plan:  continue TPN at goal rate of 60 mL/hr to provide ~ 81 gm protein and 1636 Kcals for 100% support - Electrolytes in TPN: 68mEq/L - Na, 65mEq/L - K, 16mEq/L - Ca, 15 mEq/L - Mg, 51mmol/L - Phos. - Cl:Ac ratio, continue max Chloride (TPN program will use more Cl if we maximize, unable to calc 2:1) - Add trace elements to TPN; MVI on MWF only due to national backorder - MIVF dc'd by MD 3/29 - Full TPN labs on Mon/Thurs, BMET, Mg & Phos in AM  Eudelia Bunch, Pharm.D [phone] 10/14/2019 7:56 AM

## 2019-10-15 LAB — BASIC METABOLIC PANEL
Anion gap: 5 (ref 5–15)
BUN: 17 mg/dL (ref 8–23)
CO2: 29 mmol/L (ref 22–32)
Calcium: 9.2 mg/dL (ref 8.9–10.3)
Chloride: 104 mmol/L (ref 98–111)
Creatinine, Ser: 0.36 mg/dL — ABNORMAL LOW (ref 0.44–1.00)
GFR calc Af Amer: >60 mL/min (ref >60)
GFR calc non Af Amer: >60 mL/min (ref >60)
Glucose, Bld: 136 mg/dL — ABNORMAL HIGH (ref 70–99)
Potassium: 4.7 mmol/L (ref 3.5–5.1)
Sodium: 138 mmol/L (ref 135–145)

## 2019-10-15 LAB — MAGNESIUM: Magnesium: 1.5 mg/dL — ABNORMAL LOW (ref 1.7–2.4)

## 2019-10-15 LAB — PHOSPHORUS: Phosphorus: 3.8 mg/dL (ref 2.5–4.6)

## 2019-10-15 MED ORDER — FLUCONAZOLE 100 MG PO TABS: 400.0000 mg | ORAL_TABLET | Freq: Every day | ORAL | Status: DC

## 2019-10-15 MED ORDER — MAGNESIUM SULFATE 50 % IJ SOLN
6.0000 g | Freq: Once | INTRAVENOUS | Status: AC
  Administered 2019-10-15: 6 g via INTRAVENOUS
  Filled 2019-10-15: qty 10

## 2019-10-15 MED ORDER — TRAVASOL 10 % IV SOLN
INTRAVENOUS | Status: AC
  Filled 2019-10-15: qty 806.4

## 2019-10-15 MED ORDER — PANTOPRAZOLE SODIUM 40 MG PO TBEC
40.0000 mg | DELAYED_RELEASE_TABLET | Freq: Two times a day (BID) | ORAL | Status: DC
  Administered 2019-10-15: 40 mg via ORAL
  Filled 2019-10-15: qty 1

## 2019-10-15 NOTE — Progress Notes (Signed)
PROGRESS NOTE    Claudia Wiley  [id number] DOB: 04-04-1956 DOA: 10/01/2019 PCP: [facility]   Brief Narrative: Claudia Wiley is a 64 y.o. female with a history COPD/chronic respiratory failure with hypoxia on 2 L supplemental O2 via [place], history of lung cancer s/p LUL lobectomy, gastrinoma s/p Whipple's procedure, anxiety/depression, esophageal stricture and dilatation July 2019, hypertension, and recent admission for severe Candida esophagitis with esophageal necrosis. She presented secondary to dehydration secondary to recurrent emesis and lack of intake secondary to odynophagia.   Assessment & Plan:   Principal Problem:   Candida esophagitis (HCC) Active Problems:   Unspecified protein-calorie malnutrition ([place])   Essential hypertension   Pancreatic insufficiency   COPD (chronic obstructive pulmonary disease) (HCC)   Esophagitis   Esophageal stricture   Orthostasis   Protein-calorie malnutrition, severe   Candida esophagitis Odynophagia Duodenal ulcers Severe malnutrition Patient previous treated with full course of fluconazole. Concern for possible reoccurrence of candidal esophagitis. Patient is very symptomatic with severe pain with swallowing. GI consulted and performed EGD on 3/21. TPN started. Patient is now tolerating some liquids by mouth if thinned out -GI recommendations: EGD defered, continue diet -Continue Fluconazole IV -Full liquid as tolerated, ensure -Will work with dietitian to assess dietary needs for hopeful eventual wean of TPN -Continue TPN until patient able to adequately support her nutritional needs by mouth  Acute respiratory failure with hypercapnia Patient with a pH of 7.251 initially and pCO2 of 63.5. Patient placed on bipap with improved pH of 7.309 and pCO2 of 51.6 after 90 minutes. Patient is baseline. Resolved.  Acute toxic encephalopathy Iatrogenic. Patient was tolerating narcotic regimen but acutely worsened on 3/21 with  increased lethargy. Dilaudid discontinued. Toradol used prn and now discontinued -Tylenol for pain -Oxycodone as needed  Orthostatic hypotension Secondary to dehydration. Resolved.  Dehydration Secondary to inability to maintain oral intake.  Anasarca Likely secondary to hypoalbuminemia and poor nutritional status. Recent Transthoracic Echocardiogram significant for normal functioning heart. On previous admission, also had fluid retention possibly leading to respiratory failure requiring Lasix.  -Daily weights/strict in and out  COPD Chronic respiratory failure with hypoxia Baseline uses 2L via nasal canula. Some wheezing today -Continue 2L O2 via [place]  -Dulera, Incruse Ellipta -Albuterol   DVT prophylaxis: Lovenox Code Status:   Code Status: Full Code Family Communication:  Daughter on telephone Disposition Plan: Discharge pending improvement of odynophagia, transition back to oral diet   Consultants:   [facility]  Procedures:   None  Antimicrobials:  Fluconazole    Subjective: No issues.  Objective: Vitals:   10/15/19 0500 10/15/19 0523 10/15/19 0813 10/15/19 1223  BP:  99/79  127/72  Pulse:  83  87  Resp:  18  18  Temp:  98.4 F (36.9 C)  98.7 F (37.1 C)  TempSrc:  Oral  Oral  SpO2:  100% 99% 100%  Weight: 36.4 kg     Height:        Intake/Output Summary (Last 24 hours) at 10/15/2019 1404 Last data filed at 10/15/2019 1000 Gross per 24 hour  Intake 1583.64 ml  Output 1100 ml  Net 483.64 ml   Filed Weights   10/11/19 0604 10/12/19 0609 10/15/19 0500  Weight: 34.8 kg 34.7 kg 36.4 kg    Examination:  General exam: Appears calm and comfortable. Respiratory system: Clear to auscultation. Respiratory effort normal. Cardiovascular system: S1 & S2 heard, RRR. No murmurs, rubs, gallops or clicks. Gastrointestinal system: Abdomen is nondistended, soft and with mild  epigastric tenderness. No organomegaly or masses felt. Normal bowel sounds  heard. Central nervous system: Alert and oriented. No focal neurological deficits. Extremities: No edema. No calf tenderness Skin: No cyanosis. No rashes Psychiatry: Judgement and insight appear normal. Mood & affect appropriate.      Data Reviewed: I have personally reviewed following labs and imaging studies  CBC: Recent Labs  Lab 10/11/19 0321  WBC 5.2  NEUTROABS 2.1  HGB 9.9*  HCT 32.3*  MCV 95.0  PLT [id number]   Basic Metabolic Panel: Recent Labs  Lab 10/11/19 0321 10/12/19 0353 10/13/19 0800 10/14/19 0303 10/15/19 0500  NA 140 140 137 138 138  K 4.2 4.6 4.3 4.7 4.7  CL 105 108 104 105 104  CO2 30 28 27 28 29   GLUCOSE 113* 126* 128* 123* 136*  BUN 11 16 17 19 17   CREATININE 0.33* 0.32* 0.35* 0.40* 0.36*  CALCIUM 8.7* 8.4* 8.9 8.9 9.2  MG 1.6* 1.9 1.5* 2.1 1.5*  PHOS 3.1 3.3 3.3 3.1 3.8   GFR: Estimated Creatinine Clearance: 42.4 mL/min (A) (by C-G formula based on SCr of 0.36 mg/dL (L)). Liver Function Tests: Recent Labs  Lab 10/11/19 0321 10/14/19 0303  AST 31 64*  ALT 26 72*  ALKPHOS 71 86  BILITOT 0.4 0.4  PROT 5.6* 6.3*  ALBUMIN 2.7* 3.3*   No results for input(s): LIPASE, AMYLASE in the last 168 hours. No results for input(s): AMMONIA in the last 168 hours. Coagulation Profile: No results for input(s): INR, PROTIME in the last 168 hours. Cardiac Enzymes: No results for input(s): CKTOTAL, CKMB, CKMBINDEX, TROPONINI in the last 168 hours. BNP (last 3 results) No results for input(s): PROBNP in the last 8760 hours. HbA1C: No results for input(s): HGBA1C in the last 72 hours. CBG: Recent Labs  Lab 10/11/19 1155 10/11/19 1845 10/12/19 0008 10/12/19 0607 10/13/19 0646  GLUCAP 135* 140* 128* 116* 135*   Lipid Profile: No results for input(s): CHOL, HDL, LDLCALC, TRIG, CHOLHDL, LDLDIRECT in the last 72 hours. Thyroid Function Tests: No results for input(s): TSH, T4TOTAL, FREET4, T3FREE, THYROIDAB in the last 72 hours. Anemia Panel: No results  for input(s): VITAMINB12, FOLATE, FERRITIN, TIBC, IRON, RETICCTPCT in the last 72 hours. Sepsis Labs: No results for input(s): PROCALCITON, LATICACIDVEN in the last 168 hours.  No results found for this or any previous visit (from the past 240 hour(s)).       Radiology Studies: No results found.      Scheduled Meds: . Chlorhexidine Gluconate Cloth  6 each Topical Daily  . enoxaparin (LOVENOX) injection  30 mg Subcutaneous Daily  . feeding supplement (ENSURE ENLIVE)  237 mL Oral BID BM  . [START ON 10/16/2019] fluconazole  400 mg Oral Daily  . mouth rinse  15 mL Mouth Rinse BID  . mometasone-formoterol  2 puff Inhalation BID  . pantoprazole  40 mg Oral BID  . sodium chloride flush  10-40 mL Intracatheter Q12H  . sodium chloride flush  3 mL Intravenous Q12H  . sucralfate  1 g Oral TID WC & HS  . umeclidinium bromide  1 puff Inhalation Daily   Continuous Infusions: . TPN ADULT (ION) 60 mL/hr at 10/14/19 1823  . TPN ADULT (ION)       LOS: 13 days     Cordelia Poche, MD [facility] 10/15/2019, 2:04 PM  If 7PM-7AM, please contact night-coverage [email or web address]

## 2019-10-15 NOTE — Plan of Care (Signed)
  Problem: Education: Goal: Knowledge of General Education information will improve Description: Including pain rating scale, medication(s)/side effects and non-pharmacologic comfort measures Outcome: Progressing   Problem: Health Behavior/Discharge Planning: Goal: Ability to manage health-related needs will improve Outcome: Progressing   Problem: Clinical Measurements: Goal: Ability to maintain clinical measurements within normal limits will improve Outcome: Progressing Goal: Will remain free from infection Outcome: Progressing Goal: Diagnostic test results will improve Outcome: Progressing Goal: Respiratory complications will improve Outcome: Progressing Goal: Cardiovascular complication will be avoided Outcome: Completed/Met   Problem: Activity: Goal: Risk for activity intolerance will decrease Outcome: Progressing   Problem: Elimination: Goal: Will not experience complications related to bowel motility Outcome: Progressing Goal: Will not experience complications related to urinary retention Outcome: Progressing   Problem: Safety: Goal: Ability to remain free from injury will improve Outcome: Progressing   Problem: Skin Integrity: Goal: Risk for impaired skin integrity will decrease Outcome: Completed/Met

## 2019-10-15 NOTE — Progress Notes (Signed)
PT Cancellation Note  Patient Details Name: Claudia Wiley MRN: [id number] DOB: 1956-02-10   Cancelled Treatment:    Reason Eval/Treat Not Completed: Patient declined, no reason specified(Patient declined 2x to participate in therapy. On first refusal pt stated she would be up for therapy later. Pt resting in bed both times, denied pain but reports she did not sleep well and still does not wish to participate.) Will follow up at later date/time as schedule allows.  Verner Mould, DPT Physical Therapist with [facility] [phone]  10/15/2019 3:11 PM

## 2019-10-15 NOTE — Progress Notes (Addendum)
PHARMACY - TOTAL PARENTERAL NUTRITION CONSULT NOTE   Indication: Intolerance to enteral feeding  Patient Measurements: Height: 4' 11.02" (149.9 cm) Weight: 36.4 kg (80 lb 4 oz) IBW/kg (Calculated) : 43.24 TPN AdjBW (KG): 34.4 Body mass index is 16.2 kg/m. Usual Weight:   Assessment: [name] with hx esophageal stricture (s/p dilation in July 2019) who was hospitalized from 2/25-3/7 for severe esophagitis and odynophagia as well as duodenal ulcers.  She presented back to the ED on 3/19 with c/o SOB and esophageal pain. EGD on 3/21 showed severe esophagitis with ulceration and esophageal stenosis. GI team recommends to avoid "NG tube placement given recent esophageal injury" and  start TPN for nutrition.  Glucose / Insulin: no hx DM, SSI discontinued Electrolytes: Na, K, Phos, Cl, CO2 wnl, corrCa 9.76, Mag down to 1.5 after no replacement yesterday (have replaced outside of TPN almost every day) (goal>2), (maxed Cl in TPN formula) Renal: SCr WNL LFTs / TGs: AST/ALT slightly elevated 4/1, Trig 99 (3/29) Prealbumin / albumin:  - albumin up to 3.3, prealbumin  8.5 (3/22), 12.4 (3/29) Intake / Output; MIVF: - NS at 10 ml/hr stopped by MD 3/29 - UOP 850 mls/24 hrs, 420 mls total PO intake recorded, for 4/1 30% breakfast and lunch and 60% FLD recorded  Imaging: Chest CT 3/21: mild, diffuse esophageal wall thickening, prob esophagitis, no free air, small R pleural effusion, mod emphysematous changes Surgeries / Procedures:  - 3/21 EGD: LA grade D esophagitis with contact bleeding from incisors. Scattered plaques consistent with candidiasis 3/30 barium swallow: No high grade fixed stricture demonstrated within the proximal to mid esophagus. plan is to hold off on repeating EGD right now but will still likely need EGD with dilation in the future  Central access:  PICC placed 3/22 for TPN TPN start date: 3/22   Nutritional Goals (per RD recommendation on 3/20): Kcal:   1450-1750 Protein:  73-88 Fluid:  >/= 1.4 L/day  Goal TPN rate is 60 mL/hr (provides 81 g of protein and 1636 kcals per day)  Current Nutrition:  - Full liquid diet - Ensure ordered BID, Magic Cup BID with meals - TPN  Plan:  Now: 6 gm mag bolus over 3 hours  continue TPN at goal rate of 60 mL/hr to provide ~ 81 gm protein and 1636 Kcals for 100% support - Electrolytes in TPN: 34mEq/L - Na, 24mEq/L - K, 40mEq/L - Ca, 15 mEq/L - Mg, 55mmol/L - Phos. - Cl:Ac ratio, continue max Chloride (TPN program will use more Cl if we maximize, unable to calc 2:1) - Add trace elements to TPN; MVI on MWF only due to national backorder - MIVF dc'd by MD 3/29 - Full TPN labs on Mon/Thurs, BMET, Mg in AM ? If ready to transition to PO PPI & fluconazole  Eudelia Bunch, Pharm.D [phone] 10/15/2019 7:27 AM  Addendum: D/w GI- OK to try PO PPI and fluconazole  Eudelia Bunch, Pharm.D [phone] 10/15/2019 1:47 PM

## 2019-10-16 LAB — BASIC METABOLIC PANEL
Anion gap: 6 (ref 5–15)
BUN: 19 mg/dL (ref 8–23)
CO2: 27 mmol/L (ref 22–32)
Calcium: 9 mg/dL (ref 8.9–10.3)
Chloride: 105 mmol/L (ref 98–111)
Creatinine, Ser: 0.35 mg/dL — ABNORMAL LOW (ref 0.44–1.00)
GFR calc Af Amer: >60 mL/min (ref >60)
GFR calc non Af Amer: >60 mL/min (ref >60)
Glucose, Bld: 128 mg/dL — ABNORMAL HIGH (ref 70–99)
Potassium: 4.6 mmol/L (ref 3.5–5.1)
Sodium: 138 mmol/L (ref 135–145)

## 2019-10-16 LAB — MAGNESIUM: Magnesium: 1.9 mg/dL (ref 1.7–2.4)

## 2019-10-16 MED ORDER — MAGNESIUM SULFATE 2 GM/50ML IV SOLN
2.0000 g | Freq: Once | INTRAVENOUS | Status: AC
  Administered 2019-10-16: 2 g via INTRAVENOUS
  Filled 2019-10-16: qty 50

## 2019-10-16 MED ORDER — FLUCONAZOLE 40 MG/ML PO SUSR
400.0000 mg | Freq: Every day | ORAL | Status: DC
  Administered 2019-10-16 – 2019-10-21 (×6): 400 mg via ORAL
  Filled 2019-10-16 (×6): qty 10

## 2019-10-16 MED ORDER — TRAVASOL 10 % IV SOLN
INTRAVENOUS | Status: AC
  Filled 2019-10-16: qty 806.4

## 2019-10-16 MED ORDER — DIPHENHYDRAMINE HCL 12.5 MG/5ML PO ELIX: 25.0000 mg | ORAL_SOLUTION | Freq: Three times a day (TID) | ORAL | Status: DC | PRN

## 2019-10-16 MED ORDER — LORAZEPAM 2 MG/ML IJ SOLN: 0.5000 mg | Freq: Four times a day (QID) | INTRAMUSCULAR | Status: DC | PRN

## 2019-10-16 MED ORDER — PANTOPRAZOLE SODIUM 40 MG PO PACK
40.0000 mg | PACK | Freq: Two times a day (BID) | ORAL | Status: DC
  Administered 2019-10-16 – 2019-10-21 (×11): 40 mg
  Filled 2019-10-16 (×14): qty 20

## 2019-10-16 NOTE — Progress Notes (Signed)
Physical Therapy Treatment Patient Details Name: Claudia Wiley MRN: [id number] DOB: 12-14-1955 Today's Date: 10/16/2019    History of Present Illness 64 y.o. female past medical history of COPD on home O2 history of lung cancer status post left upper lobe lobectomy, gastrinoma status post Whipple procedure esophageal stricture and dilation in July 2019 with a recent admission for severe Candida esophagitis and esophageal necrosis presents secondary to dehydration in the setting of recurrent emesis and decreased oral intake secondary to odynophagia    PT Comments    Pt  frustrated that she was given a pill and had episodes of nausea/vomiting yesterday. Now she has incr difficulty swallowing again.  Pt declines OOB at this time, agreeable to exercises. Pt reports she will amb in the room with her dtr later. Will continue to follow    Follow Up Recommendations  Home health PT;Supervision for mobility/OOB     Equipment Recommendations  None recommended by PT    Recommendations for Other Services       Precautions / Restrictions Precautions Precautions: Fall Precaution Comments: monitor sats Restrictions Weight Bearing Restrictions: No    Mobility  Bed Mobility               General bed mobility comments: pt declines OOB, she is able to roll right and left--verbalizing the importance of pressure relief. with bed flat she is able to move into long sitting and use her UEs scoot up toward [facility] without assist   Transfers                    Ambulation/Gait                 Stairs             Wheelchair Mobility    Modified Rankin (Stroke Patients Only)       Balance                                            Cognition Arousal/Alertness: Awake/alert Behavior During Therapy: WFL for tasks assessed/performed Overall Cognitive Status: Within Functional Limits for tasks assessed                                        Exercises General Exercises - Lower Extremity Ankle Circles/Pumps: AROM;Both;15 reps Quad Sets: AROM;Both;10 reps Short Arc Quad: AROM;Both;10 reps Heel Slides: AROM;Both;10 reps Hip ABduction/ADduction: AROM;Both;10 reps Straight Leg Raises: AROM;Both;10 reps    General Comments        Pertinent Vitals/Pain Pain Assessment: No/denies pain(pain on swallowing )    Home Living                      Prior Function            PT Goals (current goals can now be found in the care plan section) Acute Rehab PT Goals Patient Stated Goal: to go home soon PT Goal Formulation: With patient Time For Goal Achievement: 10/21/19 Potential to Achieve Goals: Good Progress towards PT goals: Progressing toward goals    Frequency    Min 3X/week      PT Plan Current plan remains appropriate    Co-evaluation              AM-PAC PT "6  Clicks" Mobility   Outcome Measure  Help needed turning from your back to your side while in a flat bed without using bedrails?: None Help needed moving from lying on your back to sitting on the side of a flat bed without using bedrails?: None Help needed moving to and from a bed to a chair (including a wheelchair)?: None Help needed standing up from a chair using your arms (e.g., wheelchair or bedside chair)?: A Little Help needed to walk in hospital room?: A Little Help needed climbing 3-5 steps with a railing? : A Little 6 Click Score: 21    End of Session Equipment Utilized During Treatment: Oxygen Activity Tolerance: Patient tolerated treatment well Patient left: in bed;with call bell/phone within reach;with bed alarm set Nurse Communication: Mobility status PT Visit Diagnosis: Unsteadiness on feet (R26.81);Difficulty in walking, not elsewhere classified (R26.2)     Time: [id number] PT Time Calculation (min) (ACUTE ONLY): 17 min  Charges:  $Therapeutic Exercise: 8-22 mins                     Baxter Flattery, PT   Acute Rehab Dept  [facility]): [id number]   10/16/2019    [facility] 10/16/2019, 3:20 PM

## 2019-10-16 NOTE — Progress Notes (Signed)
Nutrition Follow-up  DOCUMENTATION CODES:   Severe malnutrition in context of chronic illness, Underweight  INTERVENTION:  48 hour calorie count initiated, RD will follow up on Monday with results and recommendations  Continue Ensure BID Continue Magic Cup with meals  NUTRITION DIAGNOSIS:   Severe Malnutrition related to chronic illness(COPD/chronic respiratory failure with hypoxia on 2 L O2 at baseline, severe esophagitis, esophageal stenosis) as evidenced by moderate fat depletion, severe fat depletion, moderate muscle depletion, severe muscle depletion, percent weight loss. Ongoing, addressing via TPN and supplements   GOAL:   Patient will meet greater than or equal to 90% of their needs Met with TPN and ONS    MONITOR:   Weight trends, Labs, I & O's, Diet advancement, Supplement acceptance, PO intake  REASON FOR ASSESSMENT:   Consult Assessment of nutrition requirement/status  ASSESSMENT:   RD working remotely.  64 year old female with past medical history of COPD, chronic respiratory failure with hypoxia on 2 L O2 at home, history of lung cancer s/p LUL lobectomy, gastrinoma s/p Whipple procedure, esophageal stricture (dilation 2019), HTN, and recent admission for severe Candida esophagitis with esophageal necrosis. Patient presented to ED for recurrent emesis and lack of intake secondary to odynophagia.  Significant Events: 3/19- admission  3/21- EGD which showed severe esophagitis with ulceration and esophageal stenosis; diet advanced from NPO to FLD 3/22- double lumen PICC placed in R basilic; TPN initiation  RD consulted to assess adequacy of intake for weaning of TPN.   Weight has fluctuated throughout admission; weight today is consistent with admission weight. Patient has been on FLD since 3/21 at [place]. Per flowsheets she consumed 40% of breakfast and lunch on 3/31, 30% of breakfast and lunch and 60% of dinner on 4/1 and 30% of lunch yesterday, noted refusal  of Ensure supplement yesterday and on 3/30.   TPN at goal rate of 60 ml/hr provides 1636 kcal and 81 grams of protein. This meets 100% of estimated needs. Patient should be meeting >/= 75% of estimated needs before discontinuing TPN. Will initiate 48 hour calorie count to assess readiness for weaning of TPN.  Diet Order:   Diet Order            Diet full liquid Room service appropriate? Yes; Fluid consistency: Thin  Diet effective now              EDUCATION NEEDS:   No education needs have been identified at this time  Skin:  Skin Assessment: Reviewed RN Assessment  Last BM:  4/2  Height:   Ht Readings from Last 1 Encounters:  10/01/19 4' 11.02" (1.499 m)    Weight:   Wt Readings from Last 1 Encounters:  10/16/19 34.6 kg    Ideal Body Weight:     BMI:  Body mass index is 15.4 kg/m.  Estimated Nutritional Needs:   Kcal:  1450-1750  Protein:  73-88  Fluid:  >/= 1.4 L/day   Lajuan Lines, RD, LDN Clinical Nutrition After Hours/Weekend Pager # in [phone]

## 2019-10-16 NOTE — Progress Notes (Signed)
PHARMACY - TOTAL PARENTERAL NUTRITION CONSULT NOTE   Indication: Intolerance to enteral feeding  Patient Measurements: Height: 4' 11.02" (149.9 cm) Weight: 34.6 kg (76 lb 4.5 oz) IBW/kg (Calculated) : 43.24 TPN AdjBW (KG): 34.4 Body mass index is 15.4 kg/m. Usual Weight:   Assessment: [name] with hx esophageal stricture (s/p dilation in July 2019) who was hospitalized from 2/25-3/7 for severe esophagitis and odynophagia as well as duodenal ulcers.  She presented back to the ED on 3/19 with c/o SOB and esophageal pain. EGD on 3/21 showed severe esophagitis with ulceration and esophageal stenosis. GI team recommends to avoid "NG tube placement given recent esophageal injury" and  start TPN for nutrition.  Glucose / Insulin: no hx DM, SSI discontinued, all serum gluc < 150 Electrolytes:  CorCa 9.56, all lytes WNL, Mag 1.9 after 6 gm yesterday -have replaced Mag outside of TPN almost every day) (goal>2), (maxed Cl in TPN formula) Renal: SCr WNL LFTs / TGs: AST/ALT slightly elevated 4/1, Trig 99 (3/29) Prealbumin / albumin:  - albumin up to 3.3, prealbumin  8.5 (3/22), 12.4 (3/29) Intake / Output; MIVF: - NS at 10 ml/hr stopped by MD 3/29 - UOP 1425 mls/24 hrs, 340 mls total PO intake recorded, 30% of 1 meal recorded 4/2, 1 of 2 ensures refused 4/2  Imaging: Chest CT 3/21: mild, diffuse esophageal wall thickening, prob esophagitis, no free air, small R pleural effusion, mod emphysematous changes Surgeries / Procedures:  - 3/21 EGD: LA grade D esophagitis with contact bleeding from incisors. Scattered plaques consistent with candidiasis 3/30 barium swallow: No high grade fixed stricture demonstrated within the proximal to mid esophagus. plan is to hold off on repeating EGD right now but will still likely need EGD with dilation in the future  Central access:  PICC placed 3/22 for TPN TPN start date: 3/22   Nutritional Goals (per RD recommendation on 3/20): Kcal:   1450-1750 Protein:  73-88 Fluid:  >/= 1.4 L/day  Goal TPN rate is 60 mL/hr (provides 81 g of protein and 1636 kcals per day)  Current Nutrition:  - Full liquid diet - Ensure ordered BID, Magic Cup BID with meals - TPN  Plan:  Now: 3 gm mag bolus  continue TPN at goal rate of 60 mL/hr to provide ~ 81 gm protein and 1636 Kcals for 100% support - Electrolytes in TPN: 44mEq/L - Na, 16mEq/L - K, 36mEq/L - Ca, 15 mEq/L - Mg, 95mmol/L - Phos. - Cl:Ac ratio, continue max Chloride (TPN program will use more Cl if we maximize, unable to calc 2:1) - Add trace elements to TPN; MVI on MWF only due to national backorder - MIVF dc'd by MD 3/29 - Full TPN labs on Mon/Thurs, BMET, Mg in AM  Eudelia Bunch, Pharm.D [phone] 10/16/2019 9:06 AM  9:06 AM

## 2019-10-16 NOTE — Progress Notes (Signed)
PROGRESS NOTE    Claudia Wiley  [id number] DOB: 05-08-1956 DOA: 10/01/2019 PCP: [facility]   Brief Narrative: Claudia Wiley is a 64 y.o. female with a history COPD/chronic respiratory failure with hypoxia on 2 L supplemental O2 via [place], history of lung cancer s/p LUL lobectomy, gastrinoma s/p Whipple's procedure, anxiety/depression, esophageal stricture and dilatation July 2019, hypertension, and recent admission for severe Candida esophagitis with esophageal necrosis. She presented secondary to dehydration secondary to recurrent emesis and lack of intake secondary to odynophagia.   Assessment & Plan:   Principal Problem:   Candida esophagitis (HCC) Active Problems:   Unspecified protein-calorie malnutrition ([place])   Essential hypertension   Pancreatic insufficiency   COPD (chronic obstructive pulmonary disease) (HCC)   Esophagitis   Esophageal stricture   Orthostasis   Protein-calorie malnutrition, severe   Candida esophagitis Odynophagia Duodenal ulcers Severe malnutrition Patient previous treated with full course of fluconazole. Concern for possible reoccurrence of candidal esophagitis. Patient is very symptomatic with severe pain with swallowing. GI consulted and performed EGD on 3/21. TPN started. Patient is now tolerating some liquids by mouth if thinned out -GI recommendations: EGD defered, continue diet -Continue Fluconazole IV -Full liquid as tolerated, ensure -Dietician monitoring caloric intake; evaluation on 4/5 -Continue TPN until patient able to adequately support her nutritional needs by mouth -Patient will not tolerate pills; medications will need to be in suspension form  Acute respiratory failure with hypercapnia Patient with a pH of 7.251 initially and pCO2 of 63.5. Patient placed on bipap with improved pH of 7.309 and pCO2 of 51.6 after 90 minutes. Patient is baseline. Resolved.  Acute toxic encephalopathy Iatrogenic. Patient was tolerating  narcotic regimen but acutely worsened on 3/21 with increased lethargy. Dilaudid discontinued. Toradol used prn and now discontinued -Tylenol for pain -Oxycodone as needed  Orthostatic hypotension Secondary to dehydration. Resolved.  Dehydration Secondary to inability to maintain oral intake.  Anasarca Likely secondary to hypoalbuminemia and poor nutritional status. Recent Transthoracic Echocardiogram significant for normal functioning heart. On previous admission, also had fluid retention possibly leading to respiratory failure requiring Lasix.  -Daily weights/strict in and out  COPD Chronic respiratory failure with hypoxia Baseline uses 2L via nasal canula. Some wheezing today -Continue 2L O2 via [place]  -Dulera, Incruse Ellipta -Albuterol   DVT prophylaxis: Lovenox Code Status:   Code Status: Full Code Family Communication:  Daughter on telephone Disposition Plan: Discharge pending improvement of odynophagia, transition back to oral diet   Consultants:   [facility]  Procedures:   None  Antimicrobials:  Fluconazole    Subjective: Was not able to tolerate pills yesterday and had nausea/vomiting.  Objective: Vitals:   10/16/19 0448 10/16/19 0555 10/16/19 0811 10/16/19 1322  BP: 101/68   110/70  Pulse: 90  89 85  Resp: 19  18 16   Temp: 99.8 F (37.7 C)   98.2 F (36.8 C)  TempSrc: Oral   Oral  SpO2: 100%  100% 99%  Weight:  34.6 kg    Height:        Intake/Output Summary (Last 24 hours) at 10/16/2019 1443 Last data filed at 10/16/2019 1300 Gross per 24 hour  Intake 1375.42 ml  Output 675 ml  Net 700.42 ml   Filed Weights   10/12/19 0609 10/15/19 0500 10/16/19 0555  Weight: 34.7 kg 36.4 kg 34.6 kg    Examination:  General exam: Appears calm and comfortable. Thin Respiratory system: Clear to auscultation. Respiratory effort normal. Cardiovascular system: S1 & S2 heard, RRR.  No murmurs, rubs, gallops or clicks. Gastrointestinal system: Abdomen is  nondistended, soft and epigastric tenderness. No organomegaly or masses felt. Normal bowel sounds heard. Central nervous system: Alert and oriented. No focal neurological deficits. Extremities: No edema. No calf tenderness Skin: No cyanosis. No rashes Psychiatry: Judgement and insight appear normal. Mood & affect appropriate.    Data Reviewed: I have personally reviewed following labs and imaging studies  CBC: Recent Labs  Lab 10/11/19 0321  WBC 5.2  NEUTROABS 2.1  HGB 9.9*  HCT 32.3*  MCV 95.0  PLT [id number]   Basic Metabolic Panel: Recent Labs  Lab 10/11/19 0321 10/11/19 0321 10/12/19 0353 10/13/19 0800 10/14/19 0303 10/15/19 0500 10/16/19 0343  NA 140   < > 140 137 138 138 138  K 4.2   < > 4.6 4.3 4.7 4.7 4.6  CL 105   < > 108 104 105 104 105  CO2 30   < > 28 27 28 29 27   GLUCOSE 113*   < > 126* 128* 123* 136* 128*  BUN 11   < > 16 17 19 17 19   CREATININE 0.33*   < > 0.32* 0.35* 0.40* 0.36* 0.35*  CALCIUM 8.7*   < > 8.4* 8.9 8.9 9.2 9.0  MG 1.6*   < > 1.9 1.5* 2.1 1.5* 1.9  PHOS 3.1  --  3.3 3.3 3.1 3.8  --    < > = values in this interval not displayed.   GFR: Estimated Creatinine Clearance: 40.3 mL/min (A) (by C-G formula based on SCr of 0.35 mg/dL (L)). Liver Function Tests: Recent Labs  Lab 10/11/19 0321 10/14/19 0303  AST 31 64*  ALT 26 72*  ALKPHOS 71 86  BILITOT 0.4 0.4  PROT 5.6* 6.3*  ALBUMIN 2.7* 3.3*   No results for input(s): LIPASE, AMYLASE in the last 168 hours. No results for input(s): AMMONIA in the last 168 hours. Coagulation Profile: No results for input(s): INR, PROTIME in the last 168 hours. Cardiac Enzymes: No results for input(s): CKTOTAL, CKMB, CKMBINDEX, TROPONINI in the last 168 hours. BNP (last 3 results) No results for input(s): PROBNP in the last 8760 hours. HbA1C: No results for input(s): HGBA1C in the last 72 hours. CBG: Recent Labs  Lab 10/11/19 1155 10/11/19 1845 10/12/19 0008 10/12/19 0607 10/13/19 0646  GLUCAP  135* 140* 128* 116* 135*   Lipid Profile: No results for input(s): CHOL, HDL, LDLCALC, TRIG, CHOLHDL, LDLDIRECT in the last 72 hours. Thyroid Function Tests: No results for input(s): TSH, T4TOTAL, FREET4, T3FREE, THYROIDAB in the last 72 hours. Anemia Panel: No results for input(s): VITAMINB12, FOLATE, FERRITIN, TIBC, IRON, RETICCTPCT in the last 72 hours. Sepsis Labs: No results for input(s): PROCALCITON, LATICACIDVEN in the last 168 hours.  No results found for this or any previous visit (from the past 240 hour(s)).       Radiology Studies: No results found.      Scheduled Meds: . Chlorhexidine Gluconate Cloth  6 each Topical Daily  . enoxaparin (LOVENOX) injection  30 mg Subcutaneous Daily  . feeding supplement (ENSURE ENLIVE)  237 mL Oral BID BM  . fluconazole  400 mg Oral Daily  . mouth rinse  15 mL Mouth Rinse BID  . mometasone-formoterol  2 puff Inhalation BID  . pantoprazole sodium  40 mg Per Tube BID  . sodium chloride flush  10-40 mL Intracatheter Q12H  . sodium chloride flush  3 mL Intravenous Q12H  . sucralfate  1 g Oral TID WC &  HS  . umeclidinium bromide  1 puff Inhalation Daily   Continuous Infusions: . TPN ADULT (ION) 60 mL/hr at 10/15/19 1759  . TPN ADULT (ION)       LOS: 14 days     Cordelia Poche, MD [facility] 10/16/2019, 2:43 PM  If 7PM-7AM, please contact night-coverage [email or web address]

## 2019-10-17 LAB — BASIC METABOLIC PANEL
Anion gap: 5 (ref 5–15)
BUN: 24 mg/dL — ABNORMAL HIGH (ref 8–23)
CO2: 28 mmol/L (ref 22–32)
Calcium: 8.7 mg/dL — ABNORMAL LOW (ref 8.9–10.3)
Chloride: 104 mmol/L (ref 98–111)
Creatinine, Ser: <0.3 mg/dL — ABNORMAL LOW (ref 0.44–1.00)
Glucose, Bld: 120 mg/dL — ABNORMAL HIGH (ref 70–99)
Potassium: 4.4 mmol/L (ref 3.5–5.1)
Sodium: 137 mmol/L (ref 135–145)

## 2019-10-17 LAB — MAGNESIUM: Magnesium: 1.7 mg/dL (ref 1.7–2.4)

## 2019-10-17 MED ORDER — MAGNESIUM SULFATE 50 % IJ SOLN
6.0000 g | Freq: Once | INTRAVENOUS | Status: AC
  Administered 2019-10-17: 6 g via INTRAVENOUS
  Filled 2019-10-17: qty 12

## 2019-10-17 MED ORDER — TRAVASOL 10 % IV SOLN
INTRAVENOUS | Status: AC
  Filled 2019-10-17: qty 806.4

## 2019-10-17 NOTE — Progress Notes (Signed)
PROGRESS NOTE    Claudia Wiley  [id number] DOB: 08-22-1955 DOA: 10/01/2019 PCP: [facility]   Brief Narrative: Ramneet Measel is a 64 y.o. female with a history COPD/chronic respiratory failure with hypoxia on 2 L supplemental O2 via [place], history of lung cancer s/p LUL lobectomy, gastrinoma s/p Whipple's procedure, anxiety/depression, esophageal stricture and dilatation July 2019, hypertension, and recent admission for severe Candida esophagitis with esophageal necrosis. She presented secondary to dehydration secondary to recurrent emesis and lack of intake secondary to odynophagia.   Assessment & Plan:   Principal Problem:   Candida esophagitis (HCC) Active Problems:   Unspecified protein-calorie malnutrition ([place])   Essential hypertension   Pancreatic insufficiency   COPD (chronic obstructive pulmonary disease) (HCC)   Esophagitis   Esophageal stricture   Orthostasis   Protein-calorie malnutrition, severe   Candida esophagitis Odynophagia Duodenal ulcers Severe malnutrition Patient previous treated with full course of fluconazole. Concern for possible reoccurrence of candidal esophagitis. Patient is very symptomatic with severe pain with swallowing. GI consulted and performed EGD on 3/21. TPN started. Patient is now tolerating some liquids by mouth if thinned out -GI recommendations: EGD defered, continue diet -Continue Fluconazole IV -Full liquid as tolerated, ensure -Dietician monitoring caloric intake; evaluation on 4/5 -Continue TPN until patient able to adequately support her nutritional needs by mouth -Patient will not tolerate pills; medications will need to be in suspension form  Acute respiratory failure with hypercapnia Patient with a pH of 7.251 initially and pCO2 of 63.5. Patient placed on bipap with improved pH of 7.309 and pCO2 of 51.6 after 90 minutes. Patient is baseline. Resolved.  Acute toxic encephalopathy Iatrogenic. Patient was tolerating  narcotic regimen but acutely worsened on 3/21 with increased lethargy. Dilaudid discontinued. Toradol used prn and now discontinued -Tylenol for pain -Oxycodone as needed  Orthostatic hypotension Secondary to dehydration. Resolved.  Dehydration Secondary to inability to maintain oral intake.  Anasarca Likely secondary to hypoalbuminemia and poor nutritional status. Recent Transthoracic Echocardiogram significant for normal functioning heart. On previous admission, also had fluid retention possibly leading to respiratory failure requiring Lasix.  Improved. -Daily weights/strict in and out  COPD Chronic respiratory failure with hypoxia Baseline uses 2L via nasal canula. Some wheezing today -Continue 2L O2 via   -Dulera, Incruse Ellipta -Albuterol   DVT prophylaxis: Lovenox Code Status:   Code Status: Full Code Family Communication:  None at bedside Disposition Plan: Discharge pending improvement of odynophagia, transition back to oral diet and ability to keep adequate caloric intake   Consultants:   [facility]  Procedures:   None  Antimicrobials:  Fluconazole    Subjective: No issues overnight. No vomiting. Tolerating suspension medications.  Objective: Vitals:   10/16/19 2031 10/16/19 2042 10/17/19 0537 10/17/19 0728  BP: 108/68  (!) 110/58   Pulse: 90  95 92  Resp: 18  19 18   Temp: 98.1 F (36.7 C)  98.6 F (37 C)   TempSrc: Oral  Oral   SpO2: 99% 99% 100% 99%  Weight:   33.5 kg   Height:        Intake/Output Summary (Last 24 hours) at 10/17/2019 1142 Last data filed at 10/17/2019 0223 Gross per 24 hour  Intake 240 ml  Output 400 ml  Net -160 ml   Filed Weights   10/15/19 0500 10/16/19 0555 10/17/19 0537  Weight: 36.4 kg 34.6 kg 33.5 kg    Examination:  General exam: Appears calm and comfortable Respiratory system: Clear to auscultation. Respiratory effort normal. Cardiovascular  system: S1 & S2 heard, RRR. No murmurs, rubs, gallops or  clicks. Gastrointestinal system: Abdomen is nondistended, soft and mildly tender in epigastric area. No organomegaly or masses felt. Normal bowel sounds heard. Central nervous system: Alert and oriented. No focal neurological deficits. Extremities: No edema. No calf tenderness Skin: No cyanosis. No rashes Psychiatry: Judgement and insight appear normal. Mood & affect appropriate.     Data Reviewed: I have personally reviewed following labs and imaging studies  CBC: Recent Labs  Lab 10/11/19 0321  WBC 5.2  NEUTROABS 2.1  HGB 9.9*  HCT 32.3*  MCV 95.0  PLT [id number]   Basic Metabolic Panel: Recent Labs  Lab 10/11/19 0321 10/11/19 0321 10/12/19 0353 10/12/19 0353 10/13/19 0800 10/14/19 0303 10/15/19 0500 10/16/19 0343 10/17/19 0354  NA 140   < > 140   < > 137 138 138 138 137  K 4.2   < > 4.6   < > 4.3 4.7 4.7 4.6 4.4  CL 105   < > 108   < > 104 105 104 105 104  CO2 30   < > 28   < > 27 28 29 27 28   GLUCOSE 113*   < > 126*   < > 128* 123* 136* 128* 120*  BUN 11   < > 16   < > 17 19 17 19  24*  CREATININE 0.33*   < > 0.32*   < > 0.35* 0.40* 0.36* 0.35* <0.30*  CALCIUM 8.7*   < > 8.4*   < > 8.9 8.9 9.2 9.0 8.7*  MG 1.6*   < > 1.9   < > 1.5* 2.1 1.5* 1.9 1.7  PHOS 3.1  --  3.3  --  3.3 3.1 3.8  --   --    < > = values in this interval not displayed.   GFR: CrCl cannot be calculated (This lab value cannot be used to calculate CrCl because it is not a number: <0.30). Liver Function Tests: Recent Labs  Lab 10/11/19 0321 10/14/19 0303  AST 31 64*  ALT 26 72*  ALKPHOS 71 86  BILITOT 0.4 0.4  PROT 5.6* 6.3*  ALBUMIN 2.7* 3.3*   No results for input(s): LIPASE, AMYLASE in the last 168 hours. No results for input(s): AMMONIA in the last 168 hours. Coagulation Profile: No results for input(s): INR, PROTIME in the last 168 hours. Cardiac Enzymes: No results for input(s): CKTOTAL, CKMB, CKMBINDEX, TROPONINI in the last 168 hours. BNP (last 3 results) No results for input(s):  PROBNP in the last 8760 hours. HbA1C: No results for input(s): HGBA1C in the last 72 hours. CBG: Recent Labs  Lab 10/11/19 1155 10/11/19 1845 10/12/19 0008 10/12/19 0607 10/13/19 0646  GLUCAP 135* 140* 128* 116* 135*   Lipid Profile: No results for input(s): CHOL, HDL, LDLCALC, TRIG, CHOLHDL, LDLDIRECT in the last 72 hours. Thyroid Function Tests: No results for input(s): TSH, T4TOTAL, FREET4, T3FREE, THYROIDAB in the last 72 hours. Anemia Panel: No results for input(s): VITAMINB12, FOLATE, FERRITIN, TIBC, IRON, RETICCTPCT in the last 72 hours. Sepsis Labs: No results for input(s): PROCALCITON, LATICACIDVEN in the last 168 hours.  No results found for this or any previous visit (from the past 240 hour(s)).       Radiology Studies: No results found.      Scheduled Meds: . Chlorhexidine Gluconate Cloth  6 each Topical Daily  . enoxaparin (LOVENOX) injection  30 mg Subcutaneous Daily  . feeding supplement (ENSURE ENLIVE)  237 mL  Oral BID BM  . fluconazole  400 mg Oral Daily  . mouth rinse  15 mL Mouth Rinse BID  . mometasone-formoterol  2 puff Inhalation BID  . pantoprazole sodium  40 mg Per Tube BID  . sodium chloride flush  10-40 mL Intracatheter Q12H  . sodium chloride flush  3 mL Intravenous Q12H  . sucralfate  1 g Oral TID WC & HS  . umeclidinium bromide  1 puff Inhalation Daily   Continuous Infusions: . magnesium sulfate bolus IVPB 6 g (10/17/19 1044)  . TPN ADULT (ION) 60 mL/hr at 10/16/19 1726  . TPN ADULT (ION)       LOS: 15 days     Cordelia Poche, MD [facility] 10/17/2019, 11:42 AM  If 7PM-7AM, please contact night-coverage [email or web address]

## 2019-10-17 NOTE — Progress Notes (Signed)
PHARMACY - TOTAL PARENTERAL NUTRITION CONSULT NOTE   Indication: Intolerance to enteral feeding  Patient Measurements: Height: 4' 11.02" (149.9 cm) Weight: 33.5 kg (73 lb 13.7 oz) IBW/kg (Calculated) : 43.24 TPN AdjBW (KG): 34.4 Body mass index is 14.91 kg/m. Usual Weight:   Assessment: [name] with hx esophageal stricture (s/p dilation in July 2019) who was hospitalized from 2/25-3/7 for severe esophagitis and odynophagia as well as duodenal ulcers.  She presented back to the ED on 3/19 with c/o SOB and esophageal pain. EGD on 3/21 showed severe esophagitis with ulceration and esophageal stenosis. GI team recommends to avoid "NG tube placement given recent esophageal injury" and  start TPN for nutrition.  Glucose / Insulin: no hx DM, SSI discontinued, all serum gluc < 150 Electrolytes:  CorCa 9.26, Mag 1.7 after 2 gm yesterday -have replaced Mag outside of TPN almost every day) (goal>2), (maxed Cl in TPN formula),  All other lytes WNL, Renal: SCr WNL LFTs / TGs: AST/ALT slightly elevated 4/1, Trig 99 (3/29) Prealbumin / albumin:  - albumin up to 3.3, prealbumin  8.5 (3/22), 12.4 (3/29) Intake / Output; MIVF: - NS at 10 ml/hr stopped by MD 3/29 - UOP 400 mls/24 hrs, 440 mls total PO intake recorded, 1 of 2 Ensures taken yest. 48 hr cal count in progress, 75% breakfast, 80 % lunch, 70% dinner FLD recorded 4/3  Imaging: Chest CT 3/21: mild, diffuse esophageal wall thickening, prob esophagitis, no free air, small R pleural effusion, mod emphysematous changes Surgeries / Procedures:  - 3/21 EGD: LA grade D esophagitis with contact bleeding from incisors. Scattered plaques consistent with candidiasis 3/30 barium swallow: No high grade fixed stricture demonstrated within the proximal to mid esophagus. plan is to hold off on repeating EGD right now but will still likely need EGD with dilation in the future  Central access:  PICC placed 3/22 for TPN TPN start date: 3/22    Nutritional Goals (per RD recommendation on 3/20): Kcal:  1450-1750 Protein:  73-88 Fluid:  >/= 1.4 L/day  Goal TPN rate is 60 mL/hr (provides 81 g of protein and 1636 kcals per day)  Current Nutrition:  - Full liquid diet - Ensure ordered BID, Magic Cup BID with meals - TPN  Plan:  Now: 6 gm mag bolus  continue TPN at goal rate of 60 mL/hr to provide ~ 81 gm protein and 1636 Kcals for 100% support - Electrolytes in TPN: 55mEq/L - Na, 23mEq/L - K, 105mEq/L - Ca, 15 mEq/L - Mg, 41mmol/L - Phos. - Cl:Ac ratio, continue max Chloride (TPN program will use more Cl if we maximize, unable to calc 2:1) - Add trace elements to TPN; MVI on MWF only due to national backorder - MIVF dc'd by MD 3/29 - Full TPN labs on Mon/Thurs - f/u calorie count, ability to wean TPN off  Claudia Wiley, Pharm.D [phone] 10/17/2019 9:21 AM  9:21 AM

## 2019-10-18 LAB — DIFFERENTIAL
Abs Immature Granulocytes: 0.11 10*3/uL — ABNORMAL HIGH (ref 0.00–0.07)
Basophils Absolute: 0.1 10*3/uL (ref 0.0–0.1)
Basophils Relative: 1 %
Eosinophils Absolute: 0.1 10*3/uL (ref 0.0–0.5)
Eosinophils Relative: 2 %
Immature Granulocytes: 2 %
Lymphocytes Relative: 35 %
Lymphs Abs: 2.1 10*3/uL (ref 0.7–4.0)
Monocytes Absolute: 0.8 10*3/uL (ref 0.1–1.0)
Monocytes Relative: 13 %
Neutro Abs: 2.9 10*3/uL (ref 1.7–7.7)
Neutrophils Relative %: 47 %

## 2019-10-18 LAB — COMPREHENSIVE METABOLIC PANEL
ALT: 66 U/L — ABNORMAL HIGH (ref 0–44)
AST: 43 U/L — ABNORMAL HIGH (ref 15–41)
Albumin: 3.1 g/dL — ABNORMAL LOW (ref 3.5–5.0)
Alkaline Phosphatase: 94 U/L (ref 38–126)
Anion gap: 4 — ABNORMAL LOW (ref 5–15)
BUN: 18 mg/dL (ref 8–23)
CO2: 30 mmol/L (ref 22–32)
Calcium: 8.8 mg/dL — ABNORMAL LOW (ref 8.9–10.3)
Chloride: 105 mmol/L (ref 98–111)
Creatinine, Ser: 0.33 mg/dL — ABNORMAL LOW (ref 0.44–1.00)
GFR calc Af Amer: >60 mL/min (ref >60)
GFR calc non Af Amer: >60 mL/min (ref >60)
Glucose, Bld: 119 mg/dL — ABNORMAL HIGH (ref 70–99)
Potassium: 4.3 mmol/L (ref 3.5–5.1)
Sodium: 139 mmol/L (ref 135–145)
Total Bilirubin: 0.2 mg/dL — ABNORMAL LOW (ref 0.3–1.2)
Total Protein: 5.8 g/dL — ABNORMAL LOW (ref 6.5–8.1)

## 2019-10-18 LAB — CBC
HCT: 30.9 % — ABNORMAL LOW (ref 36.0–46.0)
Hemoglobin: 9.3 g/dL — ABNORMAL LOW (ref 12.0–15.0)
MCH: 28.5 pg (ref 26.0–34.0)
MCHC: 30.1 g/dL (ref 30.0–36.0)
MCV: 94.8 fL (ref 80.0–100.0)
Platelets: 357 10*3/uL (ref 150–400)
RBC: 3.26 MIL/uL — ABNORMAL LOW (ref 3.87–5.11)
RDW: 15.4 % (ref 11.5–15.5)
WBC: 6.1 10*3/uL (ref 4.0–10.5)
nRBC: 0 % (ref 0.0–0.2)

## 2019-10-18 LAB — TRIGLYCERIDES: Triglycerides: 51 mg/dL (ref <150)

## 2019-10-18 LAB — PHOSPHORUS: Phosphorus: 3.7 mg/dL (ref 2.5–4.6)

## 2019-10-18 LAB — PREALBUMIN: Prealbumin: 22.6 mg/dL (ref 18–38)

## 2019-10-18 LAB — MAGNESIUM: Magnesium: 1.9 mg/dL (ref 1.7–2.4)

## 2019-10-18 MED ORDER — TRAVASOL 10 % IV SOLN
INTRAVENOUS | Status: AC
  Filled 2019-10-18: qty 806.4

## 2019-10-18 MED ORDER — MAGNESIUM SULFATE 2 GM/50ML IV SOLN
2.0000 g | Freq: Once | INTRAVENOUS | Status: AC
  Administered 2019-10-18: 2 g via INTRAVENOUS
  Filled 2019-10-18: qty 50

## 2019-10-18 MED ORDER — ENOXAPARIN SODIUM 30 MG/0.3ML ~~LOC~~ SOLN
30.0000 mg | Freq: Every day | SUBCUTANEOUS | Status: DC
  Administered 2019-10-19 – 2019-10-21 (×3): 30 mg via SUBCUTANEOUS
  Filled 2019-10-18 (×3): qty 0.3

## 2019-10-18 NOTE — Progress Notes (Signed)
PT Cancellation Note  Patient Details Name: Claudia Wiley MRN: [id number] DOB: 11-02-1955   Cancelled Treatment:    Reason Eval/Treat Not Completed: Patient declined, no reason specified    Doreatha Massed, PT Acute Rehabilitation

## 2019-10-18 NOTE — Progress Notes (Signed)
PHARMACY - TOTAL PARENTERAL NUTRITION CONSULT NOTE   Indication: Intolerance to enteral feeding  Patient Measurements: Height: 4' 11.02" (149.9 cm) Weight: 35.4 kg (78 lb 0.7 oz) IBW/kg (Calculated) : 43.24 TPN AdjBW (KG): 34.4 Body mass index is 15.75 kg/m.  Assessment: [name] with hx esophageal stricture (s/p dilation in July 2019) who was hospitalized from 2/25-3/7 for severe esophagitis and odynophagia as well as duodenal ulcers.  She presented back to the ED on 3/19 with c/o SOB and esophageal pain. EGD on 3/21 showed severe esophagitis with ulceration and esophageal stenosis. GI team recommends to avoid "NG tube placement given recent esophageal injury" and  start TPN for nutrition.  Glucose / Insulin: no hx DM, SSI discontinued, all serum gluc < 150 Electrolytes:  CorCa 9.52, Mag 1.9 after 6 gm yesterday -have replaced Mag outside of TPN almost every day) (goal>2), (maxed Cl in TPN formula),  All other lytes WNL, Renal: SCr WNL LFTs / TGs: AST/ALT slightly elevated 4/5, Trig 51 (4/5) Prealbumin / albumin:  - albumin 3.1, prealbumin  8.5 (3/22), 12.4 (3/29), 22.6 (4/5) Intake / Output; MIVF: - MIVF d/c'd by MD 3/29 - UOP 1000 mls/24 hrs, 260 mls total PO intake recorded, 1 of 2 Ensures taken yest. 48 hr cal count in progress, 25% breakfast, 25 % lunch, 0% dinner FLD recorded 4/4  Imaging: Chest CT 3/21: mild, diffuse esophageal wall thickening, prob esophagitis, no free air, small R pleural effusion, mod emphysematous changes Surgeries / Procedures:  - 3/21 EGD: LA grade D esophagitis with contact bleeding from incisors. Scattered plaques consistent with candidiasis 3/30 barium swallow: No high grade fixed stricture demonstrated within the proximal to mid esophagus. plan is to hold off on repeating EGD right now but will still likely need EGD with dilation in the future  Central access:  PICC placed 3/22 for TPN TPN start date: 3/22   Nutritional Goals (per RD  recommendation on 3/20): Kcal:  1450-1750 Protein:  73-88 Fluid:  >/= 1.4 L/day  Goal TPN rate is 60 mL/hr (provides 81 g of protein and 1636 kcals per day)  Current Nutrition:  - Full liquid diet - Ensure ordered BID, Magic Cup BID with meals - TPN  Plan:  Now: 2 gm mag bolus  continue TPN at goal rate of 60 mL/hr to provide ~ 81 gm protein and 1636 Kcals for 100% support - Electrolytes in TPN: 85mEq/L - Na, 38mEq/L - K, 24mEq/L - Ca, 15 mEq/L - Mg, 63mmol/L - Phos. - Cl:Ac ratio, continue max Chloride (TPN program will use more Cl if we maximize, unable to calc 2:1) - Add trace elements to TPN; MVI on MWF only due to national backorder - MIVF dc'd by MD 3/29 - Full TPN labs on Mon/Thurs - f/u calorie count, ability to wean TPN off  Netta Cedars, PharmD, BCPS 10/18/2019 7:49 AM

## 2019-10-18 NOTE — Progress Notes (Signed)
Patient ID: Claudia Wiley, female   DOB: 1956-03-14, 64 y.o.   MRN: [id number]    Progress Note   Subjective   Day # 16 CC; nausea vomiting and odynophagia  PPI twice daily-Protonix 40 mg suspension Carafate suspension 4 times daily Completed 10 days of fluconazole-restarted oral empirically on 10/16/2019 Continues on TNA  Calorie counts-1500 cal/day recorded over the weekend  Patient says that she definitely is having less odynophagia.  She says her esophagus still feels sore, particularly in the lower part of her chest.  She had an episode over the weekend of acute pill dysphagia, and then had nausea and vomiting for about 3 hours afterwards. She is taking in full liquids, and supplements.  No significant nausea currently.   Objective   Vital signs in last 24 hours: Temp:  [97.8 F (36.6 C)-98.4 F (36.9 C)] 98.4 F (36.9 C) (04/05 0601) Pulse Rate:  [76-96] 96 (04/05 0601) Resp:  [16-17] 16 (04/05 0601) BP: (102-107)/(65-72) 107/72 (04/05 0601) SpO2:  [97 %-100 %] 98 % (04/05 0812) Weight:  [35.4 kg] 35.4 kg (04/05 0555) Last BM Date: 10/16/19 General:    Thin frail appearing African-American female in NAD Heart:  Regular rate and rhythm; no murmurs Lungs: Respirations even and unlabored, lungs CTA bilaterally Abdomen:  Soft, nontender and nondistended. Normal bowel sounds. Extremities:  Without edema. Neurologic:  Alert and oriented,  grossly normal neurologically. Psych:  Cooperative. Normal mood and affect.  Intake/Output from previous day: 04/04 0701 - 04/05 0700 In: 1222.1 [P.O.:500; I.V.:722.1] Out: 1000 [Urine:1000] Intake/Output this shift: No intake/output data recorded.  Lab Results: Recent Labs    10/18/19 0423  WBC 6.1  HGB 9.3*  HCT 30.9*  PLT 357   BMET Recent Labs    10/16/19 0343 10/17/19 0354 10/18/19 0423  NA 138 137 139  K 4.6 4.4 4.3  CL 105 104 105  CO2 27 28 30   GLUCOSE 128* 120* 119*  BUN 19 24* 18  CREATININE 0.35* <0.30* 0.33*    CALCIUM 9.0 8.7* 8.8*   LFT Recent Labs    10/18/19 0423  PROT 5.8*  ALBUMIN 3.1*  AST 43*  ALT 66*  ALKPHOS 94  BILITOT 0.2*   PT/INR No results for input(s): LABPROT, INR in the last 72 hours.  Studies/Results: No results found.     Assessment / Plan:    #29 64 year old African-American female, with history of esophageal stricture requiring prior dilation in 2019, also with history of COPD, prior gastrinoma status post Whipple in 2015 and previous left upper lobe lobectomy. She had an admission in February with COPD and complaints of dysphagia and was found to have acute esophageal necrosis by EGD on 09/11/2019. Readmitted 10/02/2019 with increased difficulty with odynophagia and poor oral intake. Repeat EGD on 3/ 21 /2021 showed grade D esophagitis with narrowed lumen to 9 mm which was easily traversed with the pediatric scope.  She also had scattered plaques consistent with candidiasis.  She was noted to have retained food in the stomach suggestive of gastroparesis. Biopsy showed acute esophagitis with ulceration, no CMV or HSV, positive for candidiasis  She has completed 10 days of IV Diflucan and has been on twice daily PPI and 4 times daily Carafate with definite improvement in symptoms over the past 2 weeks. She has fairly good caloric intake at present with full liquids and supplements. She continues to complain of odynophagia distally, and had an episode over the weekend that sounds like an acute pill dysphagia/impaction resulting  in notable episodes of vomiting.  Symptoms consistent with distal stricture, unclear how much of her current symptoms is secondary to persistent severe active esophagitis  Plan; continue current medication regimen Was restarted empirically on oral Diflucan over the weekend Patient will be scheduled for relook EGD tomorrow morning with Dr. Havery Moros, with possible esophageal dilation, if esophagitis adequately healed.  Procedure was discussed  in detail with the patient including indications risks and benefits and she is agreeable to proceed. Hold Lovenox until post procedure.        Principal Problem:   Candida esophagitis (HCC) Active Problems:   Unspecified protein-calorie malnutrition ([place])   Essential hypertension   Pancreatic insufficiency   COPD (chronic obstructive pulmonary disease) (HCC)   Esophagitis   Esophageal stricture   Orthostasis   Protein-calorie malnutrition, severe     LOS: 16 days   Alyjah Lovingood EsterwoodPA-C  10/18/2019, 8:35 AM

## 2019-10-18 NOTE — H&P (View-Only) (Signed)
Patient ID: Cyann Allinder, female   DOB: 1956/05/14, 64 y.o.   MRN: [id number]    Progress Note   Subjective   Day # 16 CC; nausea vomiting and odynophagia  PPI twice daily-Protonix 40 mg suspension Carafate suspension 4 times daily Completed 10 days of fluconazole-restarted oral empirically on 10/16/2019 Continues on TNA  Calorie counts-1500 cal/day recorded over the weekend  Patient says that she definitely is having less odynophagia.  She says her esophagus still feels sore, particularly in the lower part of her chest.  She had an episode over the weekend of acute pill dysphagia, and then had nausea and vomiting for about 3 hours afterwards. She is taking in full liquids, and supplements.  No significant nausea currently.   Objective   Vital signs in last 24 hours: Temp:  [97.8 F (36.6 C)-98.4 F (36.9 C)] 98.4 F (36.9 C) (04/05 0601) Pulse Rate:  [76-96] 96 (04/05 0601) Resp:  [16-17] 16 (04/05 0601) BP: (102-107)/(65-72) 107/72 (04/05 0601) SpO2:  [97 %-100 %] 98 % (04/05 0812) Weight:  [35.4 kg] 35.4 kg (04/05 0555) Last BM Date: 10/16/19 General:    Thin frail appearing African-American female in NAD Heart:  Regular rate and rhythm; no murmurs Lungs: Respirations even and unlabored, lungs CTA bilaterally Abdomen:  Soft, nontender and nondistended. Normal bowel sounds. Extremities:  Without edema. Neurologic:  Alert and oriented,  grossly normal neurologically. Psych:  Cooperative. Normal mood and affect.  Intake/Output from previous day: 04/04 0701 - 04/05 0700 In: 1222.1 [P.O.:500; I.V.:722.1] Out: 1000 [Urine:1000] Intake/Output this shift: No intake/output data recorded.  Lab Results: Recent Labs    10/18/19 0423  WBC 6.1  HGB 9.3*  HCT 30.9*  PLT 357   BMET Recent Labs    10/16/19 0343 10/17/19 0354 10/18/19 0423  NA 138 137 139  K 4.6 4.4 4.3  CL 105 104 105  CO2 27 28 30   GLUCOSE 128* 120* 119*  BUN 19 24* 18  CREATININE 0.35* <0.30* 0.33*    CALCIUM 9.0 8.7* 8.8*   LFT Recent Labs    10/18/19 0423  PROT 5.8*  ALBUMIN 3.1*  AST 43*  ALT 66*  ALKPHOS 94  BILITOT 0.2*   PT/INR No results for input(s): LABPROT, INR in the last 72 hours.  Studies/Results: No results found.     Assessment / Plan:    #21 64 year old African-American female, with history of esophageal stricture requiring prior dilation in 2019, also with history of COPD, prior gastrinoma status post Whipple in 2015 and previous left upper lobe lobectomy. She had an admission in February with COPD and complaints of dysphagia and was found to have acute esophageal necrosis by EGD on 09/11/2019. Readmitted 10/02/2019 with increased difficulty with odynophagia and poor oral intake. Repeat EGD on 3/ 21 /2021 showed grade D esophagitis with narrowed lumen to 9 mm which was easily traversed with the pediatric scope.  She also had scattered plaques consistent with candidiasis.  She was noted to have retained food in the stomach suggestive of gastroparesis. Biopsy showed acute esophagitis with ulceration, no CMV or HSV, positive for candidiasis  She has completed 10 days of IV Diflucan and has been on twice daily PPI and 4 times daily Carafate with definite improvement in symptoms over the past 2 weeks. She has fairly good caloric intake at present with full liquids and supplements. She continues to complain of odynophagia distally, and had an episode over the weekend that sounds like an acute pill dysphagia/impaction resulting  in notable episodes of vomiting.  Symptoms consistent with distal stricture, unclear how much of her current symptoms is secondary to persistent severe active esophagitis  Plan; continue current medication regimen Was restarted empirically on oral Diflucan over the weekend Patient will be scheduled for relook EGD tomorrow morning with Dr. Havery Moros, with possible esophageal dilation, if esophagitis adequately healed.  Procedure was discussed  in detail with the patient including indications risks and benefits and she is agreeable to proceed. Hold Lovenox until post procedure.        Principal Problem:   Candida esophagitis (HCC) Active Problems:   Unspecified protein-calorie malnutrition ([employer])   Essential hypertension   Pancreatic insufficiency   COPD (chronic obstructive pulmonary disease) (HCC)   Esophagitis   Esophageal stricture   Orthostasis   Protein-calorie malnutrition, severe     LOS: 16 days   Emerie Vanderkolk EsterwoodPA-C  10/18/2019, 8:35 AM

## 2019-10-18 NOTE — Progress Notes (Signed)
Nutrition Follow-up  DOCUMENTATION CODES:   Severe malnutrition in context of chronic illness, Underweight  INTERVENTION:  - continue Ensure Enlive BID and Magic Cup BID. - continue TPN at 60 ml/hr, consider decreasing to 30 ml/hr on 4/6. - will follow-up for second day of Calorie Count on 4/6.   NUTRITION DIAGNOSIS:   Severe Malnutrition related to chronic illness(COPD/chronic respiratory failure with hypoxia on 2 L O2 at baseline, severe esophagitis, esophageal stenosis) as evidenced by moderate fat depletion, severe fat depletion, moderate muscle depletion, severe muscle depletion, percent weight loss. -ongoing  GOAL:   Patient will meet greater than or equal to 90% of their needs -met with TPN and PO intakes  MONITOR:   Weight trends, Labs, I & O's, Diet advancement, Supplement acceptance, PO intake  REASON FOR ASSESSMENT:   Consult Calorie Count  ASSESSMENT:   64 year old female with past medical history of COPD, chronic respiratory failure with hypoxia on 2 L O2 at home, history of lung cancer s/p LUL lobectomy, gastrinoma s/p Whipple procedure, esophageal stricture (dilation 2019), HTN, and recent admission for severe Candida esophagitis with esophageal necrosis. Patient presented to ED for recurrent emesis and lack of intake secondary to odynophagia.  Significant Events: 3/19- admission  3/21- EGD which showed severe esophagitis with ulceration and esophageal stenosis; diet advanced from NPO to FLD 3/22- double lumen PICC placed in R basilic; TPN initiation 4/3- Calorie Count ordered   Weight has been fairly stable since admission on 3/19. She continues to receiving custom TPN @ 60 ml/hr which is providing 1636 kcal, 81 grams protein. This meets 100% estimated nutrition needs.  Per documentation, patient consumed the following at meals over the weekend:  4/3- 75% of breakfast, 80% of lunch, 70% of dinner (total of 1179 kcal, 28 grams protein) 4/4- 25% of  breakfast, 25% of lunch (total of 231 kcal, 6 grams protein) Total for the weekend- 1410 kcal, 34 grams protein   Patient reports that esophageal pain is improving, is only a slight pain and is not constant. She has had no problems with consuming food and beverage items, but did have some issue with swallowing pills yesterday which led to several hours of N/V. No N/V with foods or beverages. Appetite is feeling able to consume items is improving.      Labs reviewed; creatinine: 0.33 mg/dl, LFTs elevated but trending down, triglycerides: 51 mg/dl today. Medications reviewed; 2 g IV Mg sulfate x1 run 4/5, 1 g carafate TID.     Diet Order:   Diet Order            Diet NPO time specified  Diet effective midnight        Diet full liquid Room service appropriate? Yes; Fluid consistency: Thin  Diet effective now              EDUCATION NEEDS:   No education needs have been identified at this time  Skin:  Skin Assessment: Reviewed RN Assessment  Last BM:  4/5  Height:   Ht Readings from Last 1 Encounters:  10/01/19 4' 11.02" (1.499 m)    Weight:   Wt Readings from Last 1 Encounters:  10/18/19 35.4 kg    Estimated Nutritional Needs:  Kcal:  1450-1750 Protein:  73-88 Fluid:  >/= 1.4 L/day     Jarome Matin, MS, RD, LDN, CNSC Inpatient Clinical Dietitian RD pager # available in [facility]  After hours/weekend pager # available in [facility]

## 2019-10-18 NOTE — Progress Notes (Signed)
PROGRESS NOTE    Claudia Wiley  [id number] DOB: 08/03/1955 DOA: 10/01/2019 PCP: [facility]   Brief Narrative: Claudia Wiley is a 64 y.o. female with a history COPD/chronic respiratory failure with hypoxia on 2 L supplemental O2 via [place], history of lung cancer s/p LUL lobectomy, gastrinoma s/p Whipple's procedure, anxiety/depression, esophageal stricture and dilatation July 2019, hypertension, and recent admission for severe Candida esophagitis with esophageal necrosis. She presented secondary to dehydration secondary to recurrent emesis and lack of intake secondary to odynophagia.   Assessment & Plan:   Principal Problem:   Candida esophagitis (HCC) Active Problems:   Unspecified protein-calorie malnutrition ([place])   Essential hypertension   Pancreatic insufficiency   COPD (chronic obstructive pulmonary disease) (HCC)   Esophagitis   Esophageal stricture   Orthostasis   Protein-calorie malnutrition, severe   Candida esophagitis Odynophagia Duodenal ulcers Severe malnutrition Patient previous treated with full course of fluconazole. Concern for possible reoccurrence of candidal esophagitis. Patient is very symptomatic with severe pain with swallowing. GI consulted and performed EGD on 3/21. TPN started. Patient is now tolerating some liquids by mouth if thinned out -GI recommendations: EGD defered, continue diet -Switched to Fluconazole suspension (Last dose 4/8); 21 day course per GI -Full liquid as tolerated, ensure -Dietician monitoring caloric intake; evaluation on 4/5 -Continue TPN until patient able to adequately support her nutritional needs by mouth -Patient will not tolerate pills; medications will need to be in suspension form  Acute respiratory failure with hypercapnia Patient with a pH of 7.251 initially and pCO2 of 63.5. Patient placed on bipap with improved pH of 7.309 and pCO2 of 51.6 after 90 minutes. Patient is baseline. Resolved.  Acute toxic  encephalopathy Iatrogenic. Patient was tolerating narcotic regimen but acutely worsened on 3/21 with increased lethargy. Dilaudid discontinued. Toradol used prn and now discontinued -Tylenol for pain -Oxycodone as needed  Orthostatic hypotension Secondary to dehydration. Resolved.  Dehydration Secondary to inability to maintain oral intake. Resolved.  Anasarca Likely secondary to hypoalbuminemia and poor nutritional status. Recent Transthoracic Echocardiogram significant for normal functioning heart. On previous admission, also had fluid retention possibly leading to respiratory failure requiring Lasix.  resolved. -Daily weights/strict in and out  COPD Chronic respiratory failure with hypoxia Baseline uses 2L via nasal canula. Some wheezing today -Continue 2L O2 via [place]  -Dulera, Incruse Ellipta -Albuterol   DVT prophylaxis: Lovenox Code Status:   Code Status: Full Code Family Communication:  Daughter at bedside Disposition Plan: Discharge pending improvement of odynophagia, transition back to oral diet and ability to keep adequate caloric intake   Consultants:   [facility]  Procedures:   EGD (10/03/19) Impression:               - Severe esophagitis with ulceration,                            repair/regeneration reaction with contact bleeding.                            Esophageal stenosis developing from recent                            esophageal injury (acute esophageal necrosis late                            Feb 2021). Biopsied.  Candidiasis.                           - 3 cm hiatal hernia.                           - Retained gastric fluid suggesting component of                            gastroparesis.                           - No gross lesions in the stomach.                           - Normal examined duodenum. Moderate Sedation:      N/A Recommendation:           - Return patient to hospital ward for ongoing care.                           - Full liquid diet  as tolerated.                           - TPN until we determine how she progresses and to                            determine if she can meet caloric goal with liquid                            diet.                           - Continue present medications including BID IV PPI                            and sucralfate suspension 1 g TID-AC and HS.                           - All medications should be liquid or IV for now.                           - Metoclopramide 5-10 mg liquid of IV TID-AC and HS                            to promote gastric emptying and help avoid reflux.                           - Await pathology results.                           - Eventually she will need repeat EGD and very                            likely serial dilation once esophagitis has healed.  Antimicrobials:  Fluconazole (3/19>>4/8)   Subjective:  No issues noted  Objective: Vitals:   10/17/19 2246 10/18/19 0555 10/18/19 0601 10/18/19 0812  BP: 102/65  107/72   Pulse: 76  96   Resp: 17  16   Temp: 98.3 F (36.8 C)  98.4 F (36.9 C)   TempSrc: Oral  Oral   SpO2: 97%  99% 98%  Weight:  35.4 kg    Height:        Intake/Output Summary (Last 24 hours) at 10/18/2019 1242 Last data filed at 10/18/2019 1242 Gross per 24 hour  Intake 1102.11 ml  Output 1300 ml  Net -197.89 ml   Filed Weights   10/16/19 0555 10/17/19 0537 10/18/19 0555  Weight: 34.6 kg 33.5 kg 35.4 kg    Examination:  General exam: Appears calm and comfortable Respiratory system: Clear to auscultation. Respiratory effort normal. Cardiovascular system: S1 & S2 heard, RRR. No murmurs, rubs, gallops or clicks. Gastrointestinal system: Abdomen is nondistended, soft and epigastric tenderness. No organomegaly or masses felt. Normal bowel sounds heard. Central nervous system: Alert and oriented. No focal neurological deficits. Extremities: No edema. No calf tenderness Skin: No cyanosis. No rashes Psychiatry: Judgement and  insight appear normal. Mood & affect appropriate.     Data Reviewed: I have personally reviewed following labs and imaging studies  CBC: Recent Labs  Lab 10/18/19 0423  WBC 6.1  NEUTROABS 2.9  HGB 9.3*  HCT 30.9*  MCV 94.8  PLT [id number]   Basic Metabolic Panel: Recent Labs  Lab 10/12/19 0353 10/12/19 0353 10/13/19 0800 10/13/19 0800 10/14/19 0303 10/15/19 0500 10/16/19 0343 10/17/19 0354 10/18/19 0423  NA 140   < > 137   < > 138 138 138 137 139  K 4.6   < > 4.3   < > 4.7 4.7 4.6 4.4 4.3  CL 108   < > 104   < > 105 104 105 104 105  CO2 28   < > 27   < > 28 29 27 28 30   GLUCOSE 126*   < > 128*   < > 123* 136* 128* 120* 119*  BUN 16   < > 17   < > 19 17 19  24* 18  CREATININE 0.32*   < > 0.35*   < > 0.40* 0.36* 0.35* <0.30* 0.33*  CALCIUM 8.4*   < > 8.9   < > 8.9 9.2 9.0 8.7* 8.8*  MG 1.9   < > 1.5*   < > 2.1 1.5* 1.9 1.7 1.9  PHOS 3.3  --  3.3  --  3.1 3.8  --   --  3.7   < > = values in this interval not displayed.   GFR: Estimated Creatinine Clearance: 41.3 mL/min (A) (by C-G formula based on SCr of 0.33 mg/dL (L)). Liver Function Tests: Recent Labs  Lab 10/14/19 0303 10/18/19 0423  AST 64* 43*  ALT 72* 66*  ALKPHOS 86 94  BILITOT 0.4 0.2*  PROT 6.3* 5.8*  ALBUMIN 3.3* 3.1*   No results for input(s): LIPASE, AMYLASE in the last 168 hours. No results for input(s): AMMONIA in the last 168 hours. Coagulation Profile: No results for input(s): INR, PROTIME in the last 168 hours. Cardiac Enzymes: No results for input(s): CKTOTAL, CKMB, CKMBINDEX, TROPONINI in the last 168 hours. BNP (last 3 results) No results for input(s): PROBNP in the last 8760 hours. HbA1C: No results for input(s): HGBA1C in the last 72 hours. CBG: Recent Labs  Lab 10/11/19 1845 10/12/19 0008 10/12/19 [id number] 10/13/19 [id number]  GLUCAP 140* 128* 116* 135*   Lipid Profile: Recent Labs    10/18/19 0423  TRIG 51   Thyroid Function Tests: No results for input(s): TSH, T4TOTAL, FREET4,  T3FREE, THYROIDAB in the last 72 hours. Anemia Panel: No results for input(s): VITAMINB12, FOLATE, FERRITIN, TIBC, IRON, RETICCTPCT in the last 72 hours. Sepsis Labs: No results for input(s): PROCALCITON, LATICACIDVEN in the last 168 hours.  No results found for this or any previous visit (from the past 240 hour(s)).       Radiology Studies: No results found.      Scheduled Meds: . Chlorhexidine Gluconate Cloth  6 each Topical Daily  . [START ON 10/19/2019] enoxaparin (LOVENOX) injection  30 mg Subcutaneous Daily  . feeding supplement (ENSURE ENLIVE)  237 mL Oral BID BM  . fluconazole  400 mg Oral Daily  . mouth rinse  15 mL Mouth Rinse BID  . mometasone-formoterol  2 puff Inhalation BID  . pantoprazole sodium  40 mg Per Tube BID  . sodium chloride flush  10-40 mL Intracatheter Q12H  . sodium chloride flush  3 mL Intravenous Q12H  . sucralfate  1 g Oral TID WC & HS  . umeclidinium bromide  1 puff Inhalation Daily   Continuous Infusions: . TPN ADULT (ION) 60 mL/hr at 10/17/19 1751  . TPN ADULT (ION)       LOS: 16 days     Cordelia Poche, MD [facility] 10/18/2019, 12:42 PM  If 7PM-7AM, please contact night-coverage [email or web address]

## 2019-10-19 ENCOUNTER — Inpatient Hospital Stay (HOSPITAL_COMMUNITY): Payer: No Typology Code available for payment source | Admitting: Anesthesiology

## 2019-10-19 ENCOUNTER — Encounter (HOSPITAL_COMMUNITY)
Admission: EM | Disposition: A | Discharge status: Home Health | Payer: Self-pay | Source: Home / Self Care | Attending: Family Medicine

## 2019-10-19 ENCOUNTER — Encounter (HOSPITAL_COMMUNITY): Payer: Self-pay | Admitting: Internal Medicine

## 2019-10-19 HISTORY — PX: ESOPHAGOGASTRODUODENOSCOPY (EGD) WITH PROPOFOL: SHX5813

## 2019-10-19 HISTORY — PX: SAVORY DILATION: SHX5439

## 2019-10-19 LAB — GLUCOSE, CAPILLARY
Glucose-Capillary: 109 mg/dL — ABNORMAL HIGH (ref 70–99)
Glucose-Capillary: 166 mg/dL — ABNORMAL HIGH (ref 70–99)
Glucose-Capillary: 97 mg/dL (ref 70–99)

## 2019-10-19 SURGERY — ESOPHAGOGASTRODUODENOSCOPY (EGD) WITH PROPOFOL
Anesthesia: Monitor Anesthesia Care

## 2019-10-19 MED ORDER — ONDANSETRON HCL 4 MG/2ML IJ SOLN
INTRAMUSCULAR | Status: AC
  Filled 2019-10-19: qty 2

## 2019-10-19 MED ORDER — LIDOCAINE 2% (20 MG/ML) 5 ML SYRINGE
INTRAMUSCULAR | Status: DC | PRN
  Administered 2019-10-19: 100 mg via INTRAVENOUS

## 2019-10-19 MED ORDER — PROPOFOL 500 MG/50ML IV EMUL
INTRAVENOUS | Status: DC | PRN
  Administered 2019-10-19: 75 ug/kg/min via INTRAVENOUS

## 2019-10-19 MED ORDER — LACTATED RINGERS IV SOLN: INTRAVENOUS | Status: DC | PRN

## 2019-10-19 MED ORDER — PROPOFOL 500 MG/50ML IV EMUL
INTRAVENOUS | Status: AC
  Filled 2019-10-19: qty 50

## 2019-10-19 MED ORDER — PROPOFOL 10 MG/ML IV BOLUS
INTRAVENOUS | Status: DC | PRN
  Administered 2019-10-19 (×2): 20 mg via INTRAVENOUS

## 2019-10-19 MED ORDER — TRAVASOL 10 % IV SOLN
INTRAVENOUS | Status: AC
  Filled 2019-10-19: qty 403.2

## 2019-10-19 MED ORDER — INSULIN ASPART 100 UNIT/ML ~~LOC~~ SOLN
0.0000 [IU] | Freq: Four times a day (QID) | SUBCUTANEOUS | Status: DC
  Administered 2019-10-20: 1 [IU] via SUBCUTANEOUS

## 2019-10-19 SURGICAL SUPPLY — 15 items

## 2019-10-19 NOTE — Anesthesia Procedure Notes (Signed)
Date/Time: 10/19/2019 7:57 AM Performed by: Sharlette Dense, CRNA Oxygen Delivery Method: Simple face mask

## 2019-10-19 NOTE — Progress Notes (Signed)
NUTRITION NOTE  Full follow-up note yesterday (4/5). She is back on FLD after being NPO for EGD this AM. Collected envelope from patient's door and meal tickets were all from over the weekend, no tickets from 4/5. Per flow sheet documentation, she consumed 0% of lunch yesterday. Breakfast and dinner were not recorded.   Today TPN is to be decreased to half rate (30 ml/hr).      Jarome Matin, MS, RD, LDN, CNSC Inpatient Clinical Dietitian RD pager # available in [place]  After hours/weekend pager # available in [facility]

## 2019-10-19 NOTE — Progress Notes (Signed)
PHARMACY - TOTAL PARENTERAL NUTRITION CONSULT NOTE   Indication: Intolerance to enteral feeding  Patient Measurements: Height: 4' 11.02" (149.9 cm) Weight: 35 kg (77 lb 1.6 oz) IBW/kg (Calculated) : 43.24 TPN AdjBW (KG): 34.4 Body mass index is 15.56 kg/m.  Assessment: [name] with hx esophageal stricture (s/p dilation in July 2019) who was hospitalized from 2/25-3/7 for severe esophagitis and odynophagia as well as duodenal ulcers.  She presented back to the ED on 3/19 with c/o SOB and esophageal pain. EGD on 3/21 showed severe esophagitis with ulceration and esophageal stenosis. GI team recommends to avoid "NG tube placement given recent esophageal injury" and  start TPN for nutrition.  Glucose / Insulin: no hx DM, SSI discontinued, all serum gluc < 150 Electrolytes:  Most recent labs on 4/5. CorCa 9.52,  Mg was replaced (goal>2), all other lytes WNL Renal: SCr & BUN WNL LFTs / TGs: AST/ALT slightly elevated but trending down, TG 51 (4/5) Prealbumin / albumin: albumin 3.1, prealbumin WNL Intake / Output; MIVF: - MIVF d/c'd by MD 3/29 - UOP 700 mls/24 hrs, 2 of 2 Ensures taken yest. 48 hr cal count completed on 4/5 (Total intake of 1410 kcal, 34 g protein). Imaging: Chest CT 3/21: mild, diffuse esophageal wall thickening, prob esophagitis, no free air, small R pleural effusion, mod emphysematous changes Surgeries / Procedures:  - 3/21 EGD: LA grade D esophagitis with contact bleeding from incisors. Scattered plaques consistent with candidiasis -3/30 barium swallow: No high grade fixed stricture demonstrated within the proximal to mid esophagus. -4/6: EGD this morning  Central access:  PICC placed 3/22 for TPN TPN start date: 3/22   Nutritional Goals (per RD recommendation on 3/20): Kcal:  1450-1750 Protein:  73-88 Fluid:  >/= 1.4 L/day  Goal TPN rate is 60 mL/hr (provides 81 g of protein and 1636 kcals per day)  Current Nutrition:  - NPO for EGD today, previously  on CLD - Ensure ordered BID, Magic Cup BID with meals - TPN  Plan:   Decrease TPN to half-goal rate of 30 mL/hr as pt tolerated diet over the weekend. Hopefully will help promote appetite, discussed weaning with MD.  Electrolytes in TPN: No changes. 74mEq/L - Na, 20mEq/L - K, 73mEq/L - Ca, 15 mEq/L - Mg, 33mmol/L - Phos. Cl:Ac ratio - continue max chloride  Add trace elements to TPN; MVI on MWF only due to national backorder  MIVF dc'd by MD 3/29  Full TPN labs on Mon/Thurs. Recheck electrolytes with AM labs tomorrow  Resume sensitive SSI q6h while weaning TPN  Follow calorie count, ability to tolerate diet and wean TPN.  Lenis Noon, PharmD 10/19/19 9:28 AM

## 2019-10-19 NOTE — Progress Notes (Signed)
PROGRESS NOTE    Claudia Wiley  [id number] DOB: 05/12/1956 DOA: 10/01/2019 PCP: [facility]   Brief Narrative: Claudia Wiley is a 64 y.o. female with a history COPD/chronic respiratory failure with hypoxia on 2 L supplemental O2 via [place], history of lung cancer s/p LUL lobectomy, gastrinoma s/p Whipple's procedure, anxiety/depression, esophageal stricture and dilatation July 2019, hypertension, and recent admission for severe Candida esophagitis with esophageal necrosis. She presented secondary to dehydration secondary to recurrent emesis and lack of intake secondary to odynophagia. Patient started on TPN for nutrition and high dose fluconazole for treatment of candida esophagitis.   Assessment & Plan:   Principal Problem:   Candida esophagitis (HCC) Active Problems:   Unspecified protein-calorie malnutrition ([place])   Essential hypertension   Pancreatic insufficiency   COPD (chronic obstructive pulmonary disease) (HCC)   Esophagitis   Esophageal stricture   Orthostasis   Protein-calorie malnutrition, severe   Candida esophagitis Odynophagia Duodenal ulcers Severe malnutrition Patient previous treated with full course of fluconazole. Concern for possible reoccurrence of candidal esophagitis. Patient is very symptomatic with severe pain with swallowing. GI consulted and performed EGD on 3/21. TPN started. Patient is now tolerating some liquids by mouth if thinned out -GI recommendations: EGD today -Continue Fluconazole suspension (Last dose 4/8); 21 day course per GI -Full liquid as tolerated, ensure -Dietician monitoring caloric intake; evaluation on 4/5 -Wean TPN per dietitian recommendations -Patient will not tolerate pills; medications will need to be in suspension form  Acute respiratory failure with hypercapnia Patient with a pH of 7.251 initially and pCO2 of 63.5. Patient placed on bipap with improved pH of 7.309 and pCO2 of 51.6 after 90 minutes. Patient is  baseline. Resolved.  Acute toxic encephalopathy Iatrogenic. Patient was tolerating narcotic regimen but acutely worsened on 3/21 with increased lethargy. Dilaudid discontinued. Toradol used prn and now discontinued -Tylenol for pain -Oxycodone as needed  Orthostatic hypotension Secondary to dehydration. Resolved.  Dehydration Secondary to inability to maintain oral intake. Resolved.  Anasarca Likely secondary to hypoalbuminemia and poor nutritional status. Recent Transthoracic Echocardiogram significant for normal functioning heart. On previous admission, also had fluid retention possibly leading to respiratory failure requiring Lasix.  resolved. -Daily weights/strict in and out  COPD Chronic respiratory failure with hypoxia Baseline uses 2L via nasal canula. Some wheezing today -Continue 2L O2 via [place]  -Dulera, Incruse Ellipta -Albuterol   DVT prophylaxis: Lovenox Code Status:   Code Status: Full Code Family Communication:  Daughter at bedside Disposition Plan: Discharge pending improvement of odynophagia, transition back to oral diet and ability to keep adequate caloric intake   Consultants:   [facility]  Procedures:   EGD (10/03/19) Impression:               - Severe esophagitis with ulceration,                            repair/regeneration reaction with contact bleeding.                            Esophageal stenosis developing from recent                            esophageal injury (acute esophageal necrosis late  Feb 2021). Biopsied. Candidiasis.                           - 3 cm hiatal hernia.                           - Retained gastric fluid suggesting component of                            gastroparesis.                           - No gross lesions in the stomach.                           - Normal examined duodenum. Moderate Sedation:      N/A Recommendation:           - Return patient to hospital ward for ongoing care.                            - Full liquid diet as tolerated.                           - TPN until we determine how she progresses and to                            determine if she can meet caloric goal with liquid                            diet.                           - Continue present medications including BID IV PPI                            and sucralfate suspension 1 g TID-AC and HS.                           - All medications should be liquid or IV for now.                           - Metoclopramide 5-10 mg liquid of IV TID-AC and HS                            to promote gastric emptying and help avoid reflux.                           - Await pathology results.                           - Eventually she will need repeat EGD and very                            likely serial dilation once esophagitis has healed.  Antimicrobials:  Fluconazole (3/19>>4/8)  Subjective: No issues. Tolerating diet.  Objective: Vitals:   10/19/19 0840 10/19/19 0850 10/19/19 0900 10/19/19 0910  BP: (!) 162/85 133/69 (!) 141/69 139/75  Pulse: 83 80 74 80  Resp: 20 20 16 16   Temp:    98.6 F (37 C)  TempSrc:    Oral  SpO2: 100% 100% 100% 100%  Weight:      Height:        Intake/Output Summary (Last 24 hours) at 10/19/2019 1016 Last data filed at 10/19/2019 1011 Gross per 24 hour  Intake 760.9 ml  Output 1050 ml  Net -289.1 ml   Filed Weights   10/17/19 0537 10/18/19 0555 10/19/19 0523  Weight: 33.5 kg 35.4 kg 35 kg    Examination:  General exam: Appears calm and comfortable Respiratory system: Clear to auscultation. Respiratory effort normal. Cardiovascular system: S1 & S2 heard, RRR. No murmurs, rubs, gallops or clicks. Gastrointestinal system: Abdomen is nondistended, soft and with epigastric tenderness. No organomegaly or masses felt. Normal bowel sounds heard. Central nervous system: Alert and oriented. No focal neurological deficits. Extremities: No edema. No calf tenderness Skin: No  cyanosis. No rashes Psychiatry: Judgement and insight appear normal. Mood & affect appropriate.     Data Reviewed: I have personally reviewed following labs and imaging studies  CBC: Recent Labs  Lab 10/18/19 0423  WBC 6.1  NEUTROABS 2.9  HGB 9.3*  HCT 30.9*  MCV 94.8  PLT [id number]   Basic Metabolic Panel: Recent Labs  Lab 10/13/19 0800 10/13/19 0800 10/14/19 0303 10/15/19 0500 10/16/19 0343 10/17/19 0354 10/18/19 0423  NA 137   < > 138 138 138 137 139  K 4.3   < > 4.7 4.7 4.6 4.4 4.3  CL 104   < > 105 104 105 104 105  CO2 27   < > 28 29 27 28 30   GLUCOSE 128*   < > 123* 136* 128* 120* 119*  BUN 17   < > 19 17 19  24* 18  CREATININE 0.35*   < > 0.40* 0.36* 0.35* <0.30* 0.33*  CALCIUM 8.9   < > 8.9 9.2 9.0 8.7* 8.8*  MG 1.5*   < > 2.1 1.5* 1.9 1.7 1.9  PHOS 3.3  --  3.1 3.8  --   --  3.7   < > = values in this interval not displayed.   GFR: Estimated Creatinine Clearance: 40.8 mL/min (A) (by C-G formula based on SCr of 0.33 mg/dL (L)). Liver Function Tests: Recent Labs  Lab 10/14/19 0303 10/18/19 0423  AST 64* 43*  ALT 72* 66*  ALKPHOS 86 94  BILITOT 0.4 0.2*  PROT 6.3* 5.8*  ALBUMIN 3.3* 3.1*   No results for input(s): LIPASE, AMYLASE in the last 168 hours. No results for input(s): AMMONIA in the last 168 hours. Coagulation Profile: No results for input(s): INR, PROTIME in the last 168 hours. Cardiac Enzymes: No results for input(s): CKTOTAL, CKMB, CKMBINDEX, TROPONINI in the last 168 hours. BNP (last 3 results) No results for input(s): PROBNP in the last 8760 hours. HbA1C: No results for input(s): HGBA1C in the last 72 hours. CBG: Recent Labs  Lab 10/13/19 0646  GLUCAP 135*   Lipid Profile: Recent Labs    10/18/19 0423  TRIG 51   Thyroid Function Tests: No results for input(s): TSH, T4TOTAL, FREET4, T3FREE, THYROIDAB in the last 72 hours. Anemia Panel: No results for input(s): VITAMINB12, FOLATE, FERRITIN, TIBC, IRON, RETICCTPCT in the last 72  hours. Sepsis Labs: No results  for input(s): PROCALCITON, LATICACIDVEN in the last 168 hours.  No results found for this or any previous visit (from the past 240 hour(s)).       Radiology Studies: No results found.      Scheduled Meds: . Chlorhexidine Gluconate Cloth  6 each Topical Daily  . enoxaparin (LOVENOX) injection  30 mg Subcutaneous Daily  . feeding supplement (ENSURE ENLIVE)  237 mL Oral BID BM  . fluconazole  400 mg Oral Daily  . insulin aspart  0-9 Units Subcutaneous Q6H  . mouth rinse  15 mL Mouth Rinse BID  . mometasone-formoterol  2 puff Inhalation BID  . pantoprazole sodium  40 mg Per Tube BID  . sodium chloride flush  10-40 mL Intracatheter Q12H  . sodium chloride flush  3 mL Intravenous Q12H  . sucralfate  1 g Oral TID WC & HS  . umeclidinium bromide  1 puff Inhalation Daily   Continuous Infusions: . TPN ADULT (ION) 60 mL/hr at 10/18/19 1730  . TPN ADULT (ION)       LOS: 17 days     Cordelia Poche, MD [facility] 10/19/2019, 10:16 AM  If 7PM-7AM, please contact night-coverage [email or web address]

## 2019-10-19 NOTE — Anesthesia Preprocedure Evaluation (Signed)
Anesthesia Evaluation  Patient identified by MRN, date of birth, ID band Patient awake    Reviewed: Allergy & Precautions, NPO status , Patient's Chart, lab work & pertinent test results  History of Anesthesia Complications Negative for: history of anesthetic complications  Airway Mallampati: II  TM Distance: >3 FB Neck ROM: Full    Dental  (+) Edentulous Upper, Edentulous Lower   Pulmonary COPD,  oxygen dependent, former smoker,    Pulmonary exam normal        Cardiovascular hypertension, Normal cardiovascular exam     Neuro/Psych negative neurological ROS  negative psych ROS   GI/Hepatic Neg liver ROS, GERD  ,Erosive candidal esophagitis  Gastrinoma s/p Whipple   Endo/Other  negative endocrine ROS  Renal/GU negative Renal ROS  negative genitourinary   Musculoskeletal negative musculoskeletal ROS (+)   Abdominal   Peds  Hematology negative hematology ROS (+)   Anesthesia Other Findings  Echo 09/16/19: EF 60-65%, normal RV, mild MR  Reproductive/Obstetrics                             Anesthesia Physical  Anesthesia Plan  ASA: IV  Anesthesia Plan: MAC   Post-op Pain Management:    Induction: Intravenous  PONV Risk Score and Plan: 2 and Propofol infusion, TIVA and Treatment may vary due to age or medical condition  Airway Management Planned: Natural Airway, Nasal Cannula and Simple Face Mask  Additional Equipment: None  Intra-op Plan:   Post-operative Plan:   Informed Consent: I have reviewed the patients History and Physical, chart, labs and discussed the procedure including the risks, benefits and alternatives for the proposed anesthesia with the patient or authorized representative who has indicated his/her understanding and acceptance.       Plan Discussed with:   Anesthesia Plan Comments:         Anesthesia Quick Evaluation

## 2019-10-19 NOTE — Interval H&P Note (Signed)
History and Physical Interval Note:  10/19/2019 7:44 AM  Claudia Wiley  has presented today for surgery, with the diagnosis of dysphagia, and odynophagia.  The various methods of treatment have been discussed with the patient and family. After consideration of risks, benefits and other options for treatment, the patient has consented to  Procedure(s) with comments: ESOPHAGOGASTRODUODENOSCOPY (EGD) WITH PROPOFOL (N/A) - possible dilation as a surgical intervention.  The patient's history has been reviewed, patient examined, no change in status, stable for surgery.  I have reviewed the patient's chart and labs.  Questions were answered to the patient's satisfaction.     [place]

## 2019-10-19 NOTE — Op Note (Signed)
[facility] Patient Name: Claudia Wiley Procedure Date: 10/19/2019 MRN: [id number] Attending MD: Carlota Raspberry. Havery Moros , MD Date of Birth: 1956-05-16 CSN: [id number] Age: 64 Admit Type: Inpatient Procedure:                Upper GI endoscopy Indications:              For therapy of esophageal stricture - history of                            acute esophageal necrosis and candida esophagitis,                            last EGD 3/21 with healing but significant                            esophagitis persisted and stricturing developed,                            now with intermittent dysphagia to pills but                            improved odynophagia. On TPN to supplement                            nutrition. Providers:                Carlota Raspberry. Havery Moros, MD, Cleda Daub, RN, Lina Sar, Technician, Danley Danker, CRNA Referring MD:              Medicines:                Monitored Anesthesia Care Complications:            No immediate complications. Estimated blood loss:                            Minimal. Estimated Blood Loss:     Estimated blood loss was minimal. Procedure:                Pre-Anesthesia Assessment:                           - Prior to the procedure, a History and Physical                            was performed, and patient medications and                            allergies were reviewed. The patient's tolerance of                            previous anesthesia was also reviewed. The risks                            and benefits of the procedure  and the sedation                            options and risks were discussed with the patient.                            All questions were answered, and informed consent                            was obtained. Prior Anticoagulants: The patient has                            taken no previous anticoagulant or antiplatelet                            agents. ASA Grade  Assessment: III - A patient with                            severe systemic disease. After reviewing the risks                            and benefits, the patient was deemed in                            satisfactory condition to undergo the procedure.                           After obtaining informed consent, the endoscope was                            passed under direct vision. Throughout the                            procedure, the patient's blood pressure, pulse, and                            oxygen saturations were monitored continuously. The                            [id number]-[id number] ([id number]) Olympus gastroscope was                            introduced through the mouth, and advanced to the                            lower third of esophagus. The [id number]-[id number] ([id number])                            Olympus ultra slim endoscope was introduced through                            the mouth, and advanced to the second part of  duodenum. The upper GI endoscopy was accomplished                            without difficulty. The patient tolerated the                            procedure well. Scope In: Scope Out: Findings:      Esophagogastric landmarks were identified: the Z-line was found at 36       cm, the gastroesophageal junction was found at 36 cm and the upper       extent of the gastric folds was found at 39 cm from the incisors.      A 3 cm hiatal hernia was present.      One benign-appearing, intrinsic severe stenosis was found 33 to 36 cm       from the incisors. Some fibrinous exudate noted within the stricture but       the ulceration from prior esophagitis has largely healed. It could not       be traversed with the adult endoscope, thus ultra slim pediatric       endoscope was used and was able to traverse the stenosis, but caused       mild dilation itself with passage of the endoscope. This stenosis       measured roughly 5 mm (inner diameter) x 3 cm  (in length). A guidewire       was then placed and the scope was withdrawn. Dilation was performed with       a Savary dilator with mild resistance at 6 mm and 7 mm. The dilation       site was examined following endoscope reinsertion and showed appropriate       mucosal disruption but no high risk wrents appreciated. Estimated blood       loss was minimal.      The exam of the esophagus was otherwise normal.      The entire examined stomach was normal.      The duodenal bulb and second portion of the duodenum were normal. Impression:               - Esophagogastric landmarks identified.                           - 3 cm hiatal hernia.                           - Interval improvement of prior esophagitis, mostly                            healed at this point.                           - Benign-appearing esophageal stenosis, 3cm in                            length. Dilated to 13m with appropriate mucosal                            wrents. Pediatric ultraslim scope was required for  this exam to traverse the stenosis.                           - Normal stomach.                           - Normal duodenal bulb and second portion of the                            duodenum. Moderate Sedation:      No moderate sedation, case performed with MAC Recommendation:           - Return patient to hospital ward for ongoing care.                           - Full liquid diet.                           - Continue present medications.                           - Once patient able to meet nutritional needs with                            liquid diet, can stop TPN and discharge home                           - She will need close follow up and serial dilation                            moving forward given severe stricturing that has                            occured. Would repeat EGD in 10-14 days or so with                            progressive dilation if amenable.                            - Crush pills if possible, patient will continue to                            have dysphagia until lumen of her esophagus is                            dilated to a larger diameter Procedure Code(s):        --- Professional ---                           [phone], Esophagogastroduodenoscopy, flexible,                            transoral; with insertion of guide wire followed by  passage of dilator(s) through esophagus over guide                            wire Diagnosis Code(s):        --- Professional ---                           K44.9, Diaphragmatic hernia without obstruction or                            gangrene                           K22.2, Esophageal obstruction CPT copyright 2019 American Medical Association. All rights reserved. The codes documented in this report are preliminary and upon coder review may  be revised to meet current compliance requirements. Remo Lipps P. Tareka Jhaveri, MD 10/19/2019 8:40:46 AM This report has been signed electronically. Number of Addenda: 0

## 2019-10-19 NOTE — Transfer of Care (Signed)
Immediate Anesthesia Transfer of Care Note  Patient: Claudia Wiley  Procedure(s) Performed: ESOPHAGOGASTRODUODENOSCOPY (EGD) WITH PROPOFOL (N/A ) SAVORY DILATION (N/A )  Patient Location: Endoscopy Unit  Anesthesia Type:MAC  Level of Consciousness: awake and alert   Airway & Oxygen Therapy: Patient Spontanous Breathing and Patient connected to face mask oxygen  Post-op Assessment: Report given to RN and Post -op Vital signs reviewed and stable  Post vital signs: Reviewed and stable  Last Vitals:  Vitals Value Taken Time  BP    Temp    Pulse    Resp    SpO2      Last Pain:  Vitals:   10/19/19 0733  TempSrc: Oral  PainSc: 0-No pain      Patients Stated Pain Goal: 1 (73/56/70 1410)  Complications: No apparent anesthesia complications

## 2019-10-19 NOTE — Anesthesia Postprocedure Evaluation (Signed)
Anesthesia Post Note  Patient: Destany Severns  Procedure(s) Performed: ESOPHAGOGASTRODUODENOSCOPY (EGD) WITH PROPOFOL (N/A ) SAVORY DILATION (N/A )     Patient location during evaluation: Endoscopy Anesthesia Type: MAC Level of consciousness: awake and alert Pain management: pain level controlled Vital Signs Assessment: post-procedure vital signs reviewed and stable Respiratory status: spontaneous breathing, nonlabored ventilation and respiratory function stable Cardiovascular status: blood pressure returned to baseline and stable Postop Assessment: no apparent nausea or vomiting Anesthetic complications: no    Last Vitals:  Vitals:   10/19/19 0900 10/19/19 0910  BP: (!) 141/69 139/75  Pulse: 74 80  Resp: 16 16  Temp:  37 C  SpO2: 100% 100%    Last Pain:  Vitals:   10/19/19 0910  TempSrc: Oral  PainSc:                  Lidia Collum

## 2019-10-20 DIAGNOSIS — J410 Simple chronic bronchitis: Secondary | ICD-10-CM

## 2019-10-20 DIAGNOSIS — R0600 Dyspnea, unspecified: Secondary | ICD-10-CM

## 2019-10-20 LAB — BASIC METABOLIC PANEL
Anion gap: 6 (ref 5–15)
BUN: 17 mg/dL (ref 8–23)
CO2: 33 mmol/L — ABNORMAL HIGH (ref 22–32)
Calcium: 9.5 mg/dL (ref 8.9–10.3)
Chloride: 101 mmol/L (ref 98–111)
Creatinine, Ser: 0.38 mg/dL — ABNORMAL LOW (ref 0.44–1.00)
GFR calc Af Amer: >60 mL/min (ref >60)
GFR calc non Af Amer: >60 mL/min (ref >60)
Glucose, Bld: 122 mg/dL — ABNORMAL HIGH (ref 70–99)
Potassium: 4.3 mmol/L (ref 3.5–5.1)
Sodium: 140 mmol/L (ref 135–145)

## 2019-10-20 LAB — MAGNESIUM: Magnesium: 1.5 mg/dL — ABNORMAL LOW (ref 1.7–2.4)

## 2019-10-20 LAB — GLUCOSE, CAPILLARY
Glucose-Capillary: 110 mg/dL — ABNORMAL HIGH (ref 70–99)
Glucose-Capillary: 125 mg/dL — ABNORMAL HIGH (ref 70–99)

## 2019-10-20 LAB — PHOSPHORUS: Phosphorus: 4.8 mg/dL — ABNORMAL HIGH (ref 2.5–4.6)

## 2019-10-20 MED ORDER — MAGNESIUM SULFATE 2 GM/50ML IV SOLN
2.0000 g | Freq: Once | INTRAVENOUS | Status: AC
  Administered 2019-10-20: 2 g via INTRAVENOUS
  Filled 2019-10-20: qty 50

## 2019-10-20 NOTE — H&P (View-Only) (Signed)
Patient ID: Claudia Wiley, female   DOB: 1956-06-08, 64 y.o.   MRN: [id number]    Progress Note   Subjective   day # 18  CC; failure to thrive, severe esophagitis with dysphagia and odynophagia   EGD yesterday-improvement in previous severe esophagitis, mostly healed, benign-appearing esophageal stenosis, 3 cm in length dilated to 7 mm, pediatric ultraslim scope required.  TPN being weaned Prealbumin 22.6  Patient says she is a little bit sore since procedure yesterday, no significant pain.  She continues on full liquids and has been doing well with supplements.   Objective   Vital signs in last 24 hours: Temp:  [98.3 F (36.8 C)-98.6 F (37 C)] 98.3 F (36.8 C) (04/07 0535) Pulse Rate:  [74-87] 84 (04/07 0829) Resp:  [16-20] 18 (04/07 0829) BP: (104-141)/(57-75) 108/63 (04/07 0535) SpO2:  [99 %-100 %] 100 % (04/07 0829) Weight:  [34.7 kg] 34.7 kg (04/07 0535) Last BM Date: 10/18/19 General:   Older frail-appearing African-American female in NAD Heart:  Regular rate and rhythm; no murmurs Lungs: Respirations even and unlabored, lungs CTA bilaterally Abdomen:  Soft, nontender and nondistended. Normal bowel sounds. Extremities:  Without edema. Neurologic:  Alert and oriented,  grossly normal neurologically. Psych:  Cooperative. Normal mood and affect.  Intake/Output from previous day: 04/06 0701 - 04/07 0700 In: 763.9 [P.O.:240; I.V.:523.9] Out: 1100 [Urine:1100] Intake/Output this shift: No intake/output data recorded.  Lab Results: Recent Labs    10/18/19 0423  WBC 6.1  HGB 9.3*  HCT 30.9*  PLT 357   BMET Recent Labs    10/18/19 0423 10/20/19 0531  NA 139 140  K 4.3 4.3  CL 105 101  CO2 30 33*  GLUCOSE 119* 122*  BUN 18 17  CREATININE 0.33* 0.38*  CALCIUM 8.8* 9.5   LFT Recent Labs    10/18/19 0423  PROT 5.8*  ALBUMIN 3.1*  AST 43*  ALT 66*  ALKPHOS 94  BILITOT 0.2*   PT/INR No results for input(s): LABPROT, INR in the last 72  hours.  Studies/Results: No results found.     Assessment / Plan:    #47 64 year old African-American female with history of acute esophageal necrosis February 2021 and prior history of GERD requiring esophageal stricture 2019, readmitted 10/02/2019 with failure to thrive, weight loss poor intake and increasing odynophagia.  EGD 10/03/2019 showed grade D esophagitis with distal esophageal lumen of 9 mm, she had scattered plaques consistent with candidiasis and retained food in the stomach suggestive of gastroparesis Biopsies showed acute esophagitis with ulceration, no CMV or HSV.  Positive for candidiasis and she has completed a course of Diflucan  Relook EGD yesterday with significant improvement in the esophagitis, mostly healed, She has developed a severe stenosis distally.This was dilated with passage of the pediatric scope measuring approximately 5 mm, then Savary dilated to 7 mm.  Patient tolerated the procedure well, minimal soreness today.  #2 history of gastrinoma status post Whipple procedure in 2015, and left upper lobectomy #3 gastroparesis #4 COPD    Plan; TPN is being weaned today Patient has been able to take in adequate calories on a full liquid diet with addition of Ensure supplement and prostat.  She will need to have repeat EGD with dilation in about 2 weeks, we will schedule this as an outpatient, and anticipates she will need serial dilations over the next couple of months.  This was discussed with the patient today.  She may be able to be discharged tomorrow, please continue twice  daily PPI in suspension form. He is unable to tolerate any meds in tablet form at this time Continue Carafate suspension 4 times daily between meals and at bedtime Zofran 4 mg suspension every 6 hours as needed We discussed elevation of the head of the bed at home. She is not having any current nausea or vomiting, she does have evidence of gastroparesis, and we may need to consider  trial of Reglan in the future.    Principal Problem:   Candida esophagitis (HCC) Active Problems:   Unspecified protein-calorie malnutrition ([place])   Essential hypertension   Pancreatic insufficiency   COPD (chronic obstructive pulmonary disease) (HCC)   Esophagitis   Esophageal stricture   Orthostasis   Protein-calorie malnutrition, severe     LOS: 18 days   Claudia Knight PA-C 10/20/2019, 8:49 AM

## 2019-10-20 NOTE — Progress Notes (Signed)
[facility]                                                                              Patient Demographics  Claudia Wiley, is a 64 y.o. female, DOB - 11/03/1955, [id number]  Admit date - 10/01/2019   Admitting Physician Lenore Cordia, MD  Outpatient Primary MD for the patient is [facility], [facility]  Outpatient specialists:   LOS - 18  days   Medical records reviewed and are as summarized below:    Chief Complaint  Patient presents with   Shortness of Breath       Brief summary  Claudia Wiley is a 64 y.o. female with a history COPD/chronic respiratory failure with hypoxia on 2 L supplemental O2 via [place], history of lung cancer s/p LUL lobectomy, gastrinoma s/p Whipple's procedure, anxiety/depression, esophageal stricture and dilatation July 2019, hypertension, and recent admission for severe Candida esophagitis with esophageal necrosis. She presented secondary to dehydration secondary to recurrent emesis and lack of intake secondary to odynophagia. Patient started on TPN for nutrition and high dose fluconazole for treatment of candida esophagitis.    Assessment & Plan   Odynophagia secondary to Candida esophagitis, duodenal ulcers  esophagitis, severe protein calorie malnutrition -Previously treated with full course of fluconazole, possible reoccurrence of Candida esophagitis -GI was consulted, performed EGD on 3/21, TPN started -Repeat EGD on 4/6 showed improvement in previous severe esophagitis, mostly healed, benign-appearing esophageal stenosis, dilated -Continue fluconazole suspension, last dose 4/8, 20-day course per GI -TPN being weaned off, continues on full liquid diets and supplements -Patient will not be able to tolerate pills, will need to be in suspension form.  Acute respiratory failure with hypercapnia -Resolved.  Patient with pH of 7.2, PCO2 63.5, was placed on BiPAP -Currently at baseline  Acute toxic encephalopathy -Resolved.   Likely due to narcotics, initially was tolerating pain regimen but acutely worsened on 3/21 with increasing lethargy -Currently alert and oriented   Orthostatic hypotension Secondary to dehydration. Resolved.  Dehydration Improved  Anasarca -Likely due to severe protein calorie malnutrition, hypoalbuminemia, poor nutritional status -2D echo with normal EF, on previous admission also had fluid retention, requiring Lasix -Follow closely  COPD Chronic respiratory failure with hypoxia Baseline on 2 L O2 via [place], continue Incruse, Dulera, albuterol   Code Status: Full CODE STATUS DVT Prophylaxis:  Lovenox Family Communication: Discussed all imaging results, lab results, explained to the patient    Disposition Plan: Patient from home, hoping to DC home soon, once off TPN  Time Spent in minutes   25 minutes  Procedures:  EGD  Consultants:   Gastroenterology  Antimicrobials:   Anti-infectives (From admission, onward)   Start     Dose/Rate Route Frequency Ordered Stop   10/16/19 1200  fluconazole (DIFLUCAN) 40 MG/ML suspension 400 mg     400 mg Oral Daily 10/16/19 0929     10/16/19 1000  fluconazole (DIFLUCAN) tablet 400 mg  Status:  Discontinued     400 mg Oral Daily 10/15/19 1349 10/16/19 0929   10/11/19 1200  fluconazole (DIFLUCAN) IVPB 400 mg  Status:  Discontinued     400 mg  100 mL/hr over 120 Minutes Intravenous Every 24 hours 10/11/19 1000 10/15/19 1349   10/03/19 2200  fluconazole (DIFLUCAN) IVPB 200 mg  Status:  Discontinued     200 mg 100 mL/hr over 60 Minutes Intravenous Daily at bedtime 10/03/19 0826 10/11/19 1000   10/01/19 2230  fluconazole (DIFLUCAN) IVPB 400 mg  Status:  Discontinued     400 mg 100 mL/hr over 120 Minutes Intravenous Daily at bedtime 10/01/19 2201 10/03/19 0826          Medications  Scheduled Meds:  Chlorhexidine Gluconate Cloth  6 each Topical Daily   enoxaparin (LOVENOX) injection  30 mg Subcutaneous Daily   feeding  supplement (ENSURE ENLIVE)  237 mL Oral BID BM   fluconazole  400 mg Oral Daily   mouth rinse  15 mL Mouth Rinse BID   mometasone-formoterol  2 puff Inhalation BID   pantoprazole sodium  40 mg Per Tube BID   sodium chloride flush  10-40 mL Intracatheter Q12H   sodium chloride flush  3 mL Intravenous Q12H   sucralfate  1 g Oral TID WC & HS   umeclidinium bromide  1 puff Inhalation Daily   Continuous Infusions:  TPN ADULT (ION) 30 mL/hr at 10/19/19 1711   PRN Meds:.acetaminophen (TYLENOL) oral liquid 160 mg/5 mL **OR** acetaminophen, albuterol, diphenhydrAMINE, LORazepam, naLOXone (NARCAN)  injection, ondansetron (ZOFRAN) IV **OR** ondansetron, oxyCODONE, sodium chloride, sodium chloride flush      Subjective:   [name] was seen and examined today.  Very motivated to eat, tolerating full liquid diet.  States little bit sore since yesterday but otherwise no significant pain. Patient denies dizziness, chest pain, shortness of breath, new weakness, numbess, tingling. No acute events overnight.    Objective:   Vitals:   10/19/19 2043 10/19/19 2058 10/20/19 0535 10/20/19 0829  BP:  (!) 104/57 108/63   Pulse:  85 87 84  Resp:  18 18 18   Temp:  98.6 F (37 C) 98.3 F (36.8 C)   TempSrc:  Oral Oral   SpO2: 99% 100% 100% 100%  Weight:   34.7 kg   Height:        Intake/Output Summary (Last 24 hours) at 10/20/2019 1511 Last data filed at 10/20/2019 1014 Gross per 24 hour  Intake 563.9 ml  Output 950 ml  Net -386.1 ml     Wt Readings from Last 3 Encounters:  10/20/19 34.7 kg  09/19/19 34.4 kg  06/29/19 39.5 kg     Exam  General: Alert and oriented x 3, NAD  Cardiovascular: S1 S2 auscultated, no murmurs, RRR  Respiratory: Clear to auscultation bilaterally, no wheezing, rales or rhonchi  Gastrointestinal: Soft, nontender, nondistended, + bowel sounds  Ext: no pedal edema bilaterally  Neuro: No new deficit  Musculoskeletal: No digital cyanosis,  clubbing  Skin: No rashes  Psych: Normal affect and demeanor, alert and oriented x3    Data Reviewed:  I have personally reviewed following labs and imaging studies  Micro Results No results found for this or any previous visit (from the past 240 hour(s)).  Radiology Reports CT CHEST WO CONTRAST  Result Date: 10/03/2019 CLINICAL DATA:  Pain status post EGD. EXAM: CT CHEST WITHOUT CONTRAST TECHNIQUE: Multidetector CT imaging of the chest was performed following the standard protocol without IV contrast. COMPARISON:  None. FINDINGS: Cardiovascular: Heart size is normal. There is a trace pericardial effusion. There are atherosclerotic changes of the thoracic aorta without evidence for a thoracic aortic aneurysm. There are coronary artery  calcifications. Mediastinum/Nodes: --No mediastinal or hilar lymphadenopathy. --No axillary lymphadenopathy. --No supraclavicular lymphadenopathy. --Normal thyroid gland. --there is mild diffuse wall thickening of the distal esophagus which is nonspecific. Lungs/Pleura: Patient appears to be status post prior wedge resection involving the left upper lobe. There are moderate to severe emphysematous changes bilaterally. There is no pneumothorax. There is a small right-sided pleural effusion. There is a trace left-sided pleural effusion. There is mild scattered bronchial wall thickening and mucus plugging bilaterally. Upper Abdomen: No acute abnormality. Musculoskeletal: No chest wall abnormality. No acute or significant osseous findings. IMPRESSION: 1. Mild diffuse esophageal wall thickening which is nonspecific but may represent esophagitis. There is no evidence for extraluminal contrast or free air to suggest a perforation. 2. Small right-sided pleural effusion.  No pneumothorax. 3. Moderate emphysematous changes. There are postsurgical changes in the left upper lobe related to prior resection. Aortic Atherosclerosis (ICD10-I70.0) and Emphysema (ICD10-J43.9).  Electronically Signed   By: Constance Holster M.D.   On: 10/03/2019 22:48   DG CHEST PORT 1 VIEW  Result Date: 10/05/2019 CLINICAL DATA:  Shortness of breath. EXAM: PORTABLE CHEST 1 VIEW COMPARISON:  10/01/2019 and chest CT from 10/03/2019 FINDINGS: Nodular density overlying the right lower chest is most compatible with a nipple shadow based on the prior imaging. Stable hyperinflation. Sutures in the medial upper left lung. Stable scarring at the left lung base. A right arm PICC line has been placed. PICC line tip is in the lower SVC region. Heart size is stable. Trachea is midline. Negative for a pneumothorax. IMPRESSION: No acute cardiopulmonary disease. PICC line tip in the lower SVC region. Electronically Signed   By: Markus Daft M.D.   On: 10/05/2019 09:17   DG Chest Portable 1 View  Result Date: 10/01/2019 CLINICAL DATA:  Shortness of breath. Esophageal pain. History of COPD, lung cancer, stomach cancer, partial pneumonectomy. EXAM: PORTABLE CHEST 1 VIEW COMPARISON:  09/10/2019 FINDINGS: Lungs are hyperinflated. No focal consolidations or pleural effusions. No pulmonary edema. Surgical clips overlie the LEFT hilar region. IMPRESSION: No evidence for acute abnormality.  Hyperinflation. Electronically Signed   By: Nolon Nations M.D.   On: 10/01/2019 16:59   [place] EKG Site Rite  Result Date: 10/04/2019 If [facility] image not attached, placement could not be confirmed due to current cardiac rhythm.  DG ESOPHAGUS W SINGLE CM (SOL OR THIN BA)  Result Date: 10/12/2019 CLINICAL DATA:  Follow-up additional history obtained from the ordering provider: Esophageal necrosis with known stricture seen on recent EGD EXAM: ESOPHOGRAM/BARIUM SWALLOW TECHNIQUE: Single contrast examination was performed using thin barium. FLUOROSCOPY TIME:  Fluoroscopy Time:  1 minutes, 24 seconds Radiation Exposure Index (if provided by the fluoroscopic device): 3.9 mGY Number of Acquired Spot Images: 2 COMPARISON:  Esophagram  10/20/2011, CT chest 10/03/2019 FINDINGS: A thin barium single contrast esophagram was performed. Due to significant discomfort, the patient was unable to drink contrast at a rate that would adequately distend the mid to distal esophagus. This resulted in a significantly limited examination and precluded adequate evaluation for stenosis within the mid to distal esophagus. No high-grade fixed stricture was demonstrated within the proximal to mid esophagus. There was no appreciable mucosal abnormality, although evaluation was significantly limited on this single contrast exam. Small sliding hiatal hernia. No reflux was observed with the patient in the standing position. IMPRESSION: Due to significant discomfort, the patient was unable to drink contrast at a rate that would adequately distend the mid to distal esophagus. This resulted in a significantly  limited examination and precluded adequate evaluation for stenosis within the mid to distal esophagus. No high-grade fixed stricture demonstrated within the proximal to mid esophagus Small sliding hiatal hernia. Electronically Signed   By: Kellie Simmering DO   On: 10/12/2019 15:23    Lab Data:  CBC: Recent Labs  Lab 10/18/19 0423  WBC 6.1  NEUTROABS 2.9  HGB 9.3*  HCT 30.9*  MCV 94.8  PLT [id number]   Basic Metabolic Panel: Recent Labs  Lab 10/14/19 0303 10/14/19 0303 10/15/19 0500 10/16/19 0343 10/17/19 0354 10/18/19 0423 10/20/19 0531  NA 138   < > 138 138 137 139 140  K 4.7   < > 4.7 4.6 4.4 4.3 4.3  CL 105   < > 104 105 104 105 101  CO2 28   < > 29 27 28 30  33*  GLUCOSE 123*   < > 136* 128* 120* 119* 122*  BUN 19   < > 17 19 24* 18 17  CREATININE 0.40*   < > 0.36* 0.35* <0.30* 0.33* 0.38*  CALCIUM 8.9   < > 9.2 9.0 8.7* 8.8* 9.5  MG 2.1   < > 1.5* 1.9 1.7 1.9 1.5*  PHOS 3.1  --  3.8  --   --  3.7 4.8*   < > = values in this interval not displayed.   GFR: Estimated Creatinine Clearance: 40.5 mL/min (A) (by C-G formula based on SCr of 0.38  mg/dL (L)). Liver Function Tests: Recent Labs  Lab 10/14/19 0303 10/18/19 0423  AST 64* 43*  ALT 72* 66*  ALKPHOS 86 94  BILITOT 0.4 0.2*  PROT 6.3* 5.8*  ALBUMIN 3.3* 3.1*   No results for input(s): LIPASE, AMYLASE in the last 168 hours. No results for input(s): AMMONIA in the last 168 hours. Coagulation Profile: No results for input(s): INR, PROTIME in the last 168 hours. Cardiac Enzymes: No results for input(s): CKTOTAL, CKMB, CKMBINDEX, TROPONINI in the last 168 hours. BNP (last 3 results) No results for input(s): PROBNP in the last 8760 hours. HbA1C: No results for input(s): HGBA1C in the last 72 hours. CBG: Recent Labs  Lab 10/19/19 1145 10/19/19 1745 10/19/19 2355 10/20/19 0539 10/20/19 1148  GLUCAP 166* 97 109* 110* 125*   Lipid Profile: Recent Labs    10/18/19 0423  TRIG 51   Thyroid Function Tests: No results for input(s): TSH, T4TOTAL, FREET4, T3FREE, THYROIDAB in the last 72 hours. Anemia Panel: No results for input(s): VITAMINB12, FOLATE, FERRITIN, TIBC, IRON, RETICCTPCT in the last 72 hours. Urine analysis:    Component Value Date/Time   COLORURINE AMBER (A) 10/01/2019 2024   APPEARANCEUR CLEAR 10/01/2019 2024   LABSPEC 1.032 (H) 10/01/2019 2024   PHURINE 5.0 10/01/2019 2024   GLUCOSEU NEGATIVE 10/01/2019 2024   HGBUR NEGATIVE 10/01/2019 2024   BILIRUBINUR SMALL (A) 10/01/2019 2024   KETONESUR 80 (A) 10/01/2019 2024   PROTEINUR 30 (A) 10/01/2019 2024   UROBILINOGEN 0.2 12/27/2012 0454   NITRITE NEGATIVE 10/01/2019 2024   LEUKOCYTESUR TRACE (A) 10/01/2019 2024     Irianna Gilday M.D. [facility] 10/20/2019, 3:11 PM   Call night coverage person covering after 7pm

## 2019-10-20 NOTE — Progress Notes (Signed)
Patient ID: Claudia Wiley, female   DOB: Sep 07, 1955, 64 y.o.   MRN: [id number]    Progress Note   Subjective   day # 18  CC; failure to thrive, severe esophagitis with dysphagia and odynophagia   EGD yesterday-improvement in previous severe esophagitis, mostly healed, benign-appearing esophageal stenosis, 3 cm in length dilated to 7 mm, pediatric ultraslim scope required.  TPN being weaned Prealbumin 22.6  Patient says she is a little bit sore since procedure yesterday, no significant pain.  She continues on full liquids and has been doing well with supplements.   Objective   Vital signs in last 24 hours: Temp:  [98.3 F (36.8 C)-98.6 F (37 C)] 98.3 F (36.8 C) (04/07 0535) Pulse Rate:  [74-87] 84 (04/07 0829) Resp:  [16-20] 18 (04/07 0829) BP: (104-141)/(57-75) 108/63 (04/07 0535) SpO2:  [99 %-100 %] 100 % (04/07 0829) Weight:  [34.7 kg] 34.7 kg (04/07 0535) Last BM Date: 10/18/19 General:   Older frail-appearing African-American female in NAD Heart:  Regular rate and rhythm; no murmurs Lungs: Respirations even and unlabored, lungs CTA bilaterally Abdomen:  Soft, nontender and nondistended. Normal bowel sounds. Extremities:  Without edema. Neurologic:  Alert and oriented,  grossly normal neurologically. Psych:  Cooperative. Normal mood and affect.  Intake/Output from previous day: 04/06 0701 - 04/07 0700 In: 763.9 [P.O.:240; I.V.:523.9] Out: 1100 [Urine:1100] Intake/Output this shift: No intake/output data recorded.  Lab Results: Recent Labs    10/18/19 0423  WBC 6.1  HGB 9.3*  HCT 30.9*  PLT 357   BMET Recent Labs    10/18/19 0423 10/20/19 0531  NA 139 140  K 4.3 4.3  CL 105 101  CO2 30 33*  GLUCOSE 119* 122*  BUN 18 17  CREATININE 0.33* 0.38*  CALCIUM 8.8* 9.5   LFT Recent Labs    10/18/19 0423  PROT 5.8*  ALBUMIN 3.1*  AST 43*  ALT 66*  ALKPHOS 94  BILITOT 0.2*   PT/INR No results for input(s): LABPROT, INR in the last 72  hours.  Studies/Results: No results found.     Assessment / Plan:    #23 64 year old African-American female with history of acute esophageal necrosis February 2021 and prior history of GERD requiring esophageal stricture 2019, readmitted 10/02/2019 with failure to thrive, weight loss poor intake and increasing odynophagia.  EGD 10/03/2019 showed grade D esophagitis with distal esophageal lumen of 9 mm, she had scattered plaques consistent with candidiasis and retained food in the stomach suggestive of gastroparesis Biopsies showed acute esophagitis with ulceration, no CMV or HSV.  Positive for candidiasis and she has completed a course of Diflucan  Relook EGD yesterday with significant improvement in the esophagitis, mostly healed, She has developed a severe stenosis distally.This was dilated with passage of the pediatric scope measuring approximately 5 mm, then Savary dilated to 7 mm.  Patient tolerated the procedure well, minimal soreness today.  #2 history of gastrinoma status post Whipple procedure in 2015, and left upper lobectomy #3 gastroparesis #4 COPD    Plan; TPN is being weaned today Patient has been able to take in adequate calories on a full liquid diet with addition of Ensure supplement and prostat.  She will need to have repeat EGD with dilation in about 2 weeks, we will schedule this as an outpatient, and anticipates she will need serial dilations over the next couple of months.  This was discussed with the patient today.  She may be able to be discharged tomorrow, please continue twice  daily PPI in suspension form. He is unable to tolerate any meds in tablet form at this time Continue Carafate suspension 4 times daily between meals and at bedtime Zofran 4 mg suspension every 6 hours as needed We discussed elevation of the head of the bed at home. She is not having any current nausea or vomiting, she does have evidence of gastroparesis, and we may need to consider  trial of Reglan in the future.    Principal Problem:   Candida esophagitis (HCC) Active Problems:   Unspecified protein-calorie malnutrition ([place])   Essential hypertension   Pancreatic insufficiency   COPD (chronic obstructive pulmonary disease) (HCC)   Esophagitis   Esophageal stricture   Orthostasis   Protein-calorie malnutrition, severe     LOS: 18 days   Santez Woodcox PA-C 10/20/2019, 8:49 AM

## 2019-10-20 NOTE — H&P (View-Only) (Signed)
Patient ID: Claudia Wiley, female   DOB: May 10, 1956, 64 y.o.   MRN: [id number]    Progress Note   Subjective   day # 18  CC; failure to thrive, severe esophagitis with dysphagia and odynophagia   EGD yesterday-improvement in previous severe esophagitis, mostly healed, benign-appearing esophageal stenosis, 3 cm in length dilated to 7 mm, pediatric ultraslim scope required.  TPN being weaned Prealbumin 22.6  Patient says she is a little bit sore since procedure yesterday, no significant pain.  She continues on full liquids and has been doing well with supplements.   Objective   Vital signs in last 24 hours: Temp:  [98.3 F (36.8 C)-98.6 F (37 C)] 98.3 F (36.8 C) (04/07 0535) Pulse Rate:  [74-87] 84 (04/07 0829) Resp:  [16-20] 18 (04/07 0829) BP: (104-141)/(57-75) 108/63 (04/07 0535) SpO2:  [99 %-100 %] 100 % (04/07 0829) Weight:  [34.7 kg] 34.7 kg (04/07 0535) Last BM Date: 10/18/19 General:   Older frail-appearing African-American female in NAD Heart:  Regular rate and rhythm; no murmurs Lungs: Respirations even and unlabored, lungs CTA bilaterally Abdomen:  Soft, nontender and nondistended. Normal bowel sounds. Extremities:  Without edema. Neurologic:  Alert and oriented,  grossly normal neurologically. Psych:  Cooperative. Normal mood and affect.  Intake/Output from previous day: 04/06 0701 - 04/07 0700 In: 763.9 [P.O.:240; I.V.:523.9] Out: 1100 [Urine:1100] Intake/Output this shift: No intake/output data recorded.  Lab Results: Recent Labs    10/18/19 0423  WBC 6.1  HGB 9.3*  HCT 30.9*  PLT 357   BMET Recent Labs    10/18/19 0423 10/20/19 0531  NA 139 140  K 4.3 4.3  CL 105 101  CO2 30 33*  GLUCOSE 119* 122*  BUN 18 17  CREATININE 0.33* 0.38*  CALCIUM 8.8* 9.5   LFT Recent Labs    10/18/19 0423  PROT 5.8*  ALBUMIN 3.1*  AST 43*  ALT 66*  ALKPHOS 94  BILITOT 0.2*   PT/INR No results for input(s): LABPROT, INR in the last 72  hours.  Studies/Results: No results found.     Assessment / Plan:    #35 64 year old African-American female with history of acute esophageal necrosis February 2021 and prior history of GERD requiring esophageal stricture 2019, readmitted 10/02/2019 with failure to thrive, weight loss poor intake and increasing odynophagia.  EGD 10/03/2019 showed grade D esophagitis with distal esophageal lumen of 9 mm, she had scattered plaques consistent with candidiasis and retained food in the stomach suggestive of gastroparesis Biopsies showed acute esophagitis with ulceration, no CMV or HSV.  Positive for candidiasis and she has completed a course of Diflucan  Relook EGD yesterday with significant improvement in the esophagitis, mostly healed, She has developed a severe stenosis distally.This was dilated with passage of the pediatric scope measuring approximately 5 mm, then Savary dilated to 7 mm.  Patient tolerated the procedure well, minimal soreness today.  #2 history of gastrinoma status post Whipple procedure in 2015, and left upper lobectomy #3 gastroparesis #4 COPD    Plan; TPN is being weaned today Patient has been able to take in adequate calories on a full liquid diet with addition of Ensure supplement and prostat.  She will need to have repeat EGD with dilation in about 2 weeks, we will schedule this as an outpatient, and anticipates she will need serial dilations over the next couple of months.  This was discussed with the patient today.  She may be able to be discharged tomorrow, please continue twice  daily PPI in suspension form. He is unable to tolerate any meds in tablet form at this time Continue Carafate suspension 4 times daily between meals and at bedtime Zofran 4 mg suspension every 6 hours as needed We discussed elevation of the head of the bed at home. She is not having any current nausea or vomiting, she does have evidence of gastroparesis, and we may need to consider  trial of Reglan in the future.    Principal Problem:   Candida esophagitis (HCC) Active Problems:   Unspecified protein-calorie malnutrition ([place])   Essential hypertension   Pancreatic insufficiency   COPD (chronic obstructive pulmonary disease) (HCC)   Esophagitis   Esophageal stricture   Orthostasis   Protein-calorie malnutrition, severe     LOS: 18 days   Claudia Dundon PA-C 10/20/2019, 8:49 AM

## 2019-10-20 NOTE — Progress Notes (Signed)
PHARMACY - TOTAL PARENTERAL NUTRITION CONSULT NOTE   Indication: Intolerance to enteral feeding  Patient Measurements: Height: 4' 11.02" (149.9 cm) Weight: 34.7 kg (76 lb 9.6 oz) IBW/kg (Calculated) : 43.24 TPN AdjBW (KG): 34.4 Body mass index is 15.46 kg/m.  Assessment: [name] with hx esophageal stricture (s/p dilation in July 2019) who was hospitalized from 2/25-3/7 for severe esophagitis and odynophagia as well as duodenal ulcers.  She presented back to the ED on 3/19 with c/o SOB and esophageal pain. EGD on 3/21 showed severe esophagitis with ulceration and esophageal stenosis. GI team recommends to avoid "NG tube placement given recent esophageal injury"; TPN started for nutrition.  Glucose / Insulin: no hx DM, BG at goal < 150. No SSI in past 24 hrs. Electrolytes:  Mg (1.5) low - goal >2, Phos (4.8) elevated. All other lytes WNL including CorrCa 10.2. Renal: SCr & BUN WNL and stable LFTs / TGs: AST/ALT slightly elevated but trending down, TG 51 (4/5) Prealbumin / albumin: albumin 3.1, prealbumin WNL Intake / Output; MIVF: MIVF d/c'd by MD 3/29 - UOP 1100 mls/24 hrs, 1 of 2 Ensures taken yest. 48 hr cal count completed on 4/5 (Total intake of 1410 kcal, 34 g protein). 70% of meals charted in past 24 hours. Imaging: Chest CT 3/21: mild, diffuse esophageal wall thickening, prob esophagitis, no free air, small R pleural effusion, mod emphysematous changes Surgeries / Procedures:  - 3/21 EGD: LA grade D esophagitis with contact bleeding from incisors. Scattered plaques consistent with candidiasis -3/30 barium swallow: No high grade fixed stricture demonstrated within the proximal to mid esophagus. -4/6: EGD with dilation revealed improvement of esophagitis, mostly healed.   Central access:  PICC placed 3/22 for TPN TPN start date: 3/22   Nutritional Goals (per RD recommendation on 3/20): Kcal:  1450-1750 Protein:  73-88 Fluid:  >/= 1.4 L/day  Goal TPN rate of 60  mL/hr (provides 81 g of protein and 1636 kcals per day)  Current Nutrition:  - Full liquid diet. Advanced on 4/6 after EGD - Ensure ordered BID, Magic Cup BID with meals - TPN, decreased to 1/2 of goal rate on 4/6  Plan:   Per discussion with MD, will taper TPN off today.   Recheck electrolytes with AM labs tomorrow  Discontinue SSI and associated TPN labs  Pharmacy signing off. Please re-consult if needed.   Lenis Noon, PharmD 10/20/19 8:21 AM

## 2019-10-20 NOTE — Plan of Care (Signed)
  Problem: Activity: Goal: Risk for activity intolerance will decrease Outcome: Progressing   Problem: Safety: Goal: Ability to remain free from injury will improve Outcome: Progressing   Problem: Education: Goal: Knowledge of General Education information will improve Description: Including pain rating scale, medication(s)/side effects and non-pharmacologic comfort measures Outcome: Completed/Met

## 2019-10-21 DIAGNOSIS — E860 Dehydration: Secondary | ICD-10-CM

## 2019-10-21 LAB — BASIC METABOLIC PANEL
Anion gap: 9 (ref 5–15)
BUN: 14 mg/dL (ref 8–23)
CO2: 33 mmol/L — ABNORMAL HIGH (ref 22–32)
Calcium: 9.3 mg/dL (ref 8.9–10.3)
Chloride: 95 mmol/L — ABNORMAL LOW (ref 98–111)
Creatinine, Ser: 0.43 mg/dL — ABNORMAL LOW (ref 0.44–1.00)
GFR calc Af Amer: >60 mL/min (ref >60)
GFR calc non Af Amer: >60 mL/min (ref >60)
Glucose, Bld: 89 mg/dL (ref 70–99)
Potassium: 4.1 mmol/L (ref 3.5–5.1)
Sodium: 137 mmol/L (ref 135–145)

## 2019-10-21 LAB — MAGNESIUM: Magnesium: 1.6 mg/dL — ABNORMAL LOW (ref 1.7–2.4)

## 2019-10-21 LAB — PHOSPHORUS: Phosphorus: 4.7 mg/dL — ABNORMAL HIGH (ref 2.5–4.6)

## 2019-10-21 MED ORDER — LIP MEDEX EX OINT
TOPICAL_OINTMENT | CUTANEOUS | Status: AC
  Filled 2019-10-21: qty 7

## 2019-10-21 MED ORDER — OXYCODONE HCL 20 MG/ML PO CONC: 6.0000 mg | Freq: Four times a day (QID) | ORAL | 0 refills | Status: DC | PRN

## 2019-10-21 MED ORDER — PANTOPRAZOLE SODIUM 40 MG PO PACK: 40.0000 mg | PACK | Freq: Two times a day (BID) | ORAL | 1 refills | Status: DC

## 2019-10-21 MED ORDER — LIDOCAINE VISCOUS HCL 2 % MT SOLN: 15.0000 mL | Freq: Three times a day (TID) | OROMUCOSAL | 0 refills | Status: AC

## 2019-10-21 MED ORDER — ONDANSETRON 4 MG PO TBDP: 4.0000 mg | ORAL_TABLET | Freq: Three times a day (TID) | ORAL | 0 refills | Status: DC | PRN

## 2019-10-21 NOTE — Plan of Care (Signed)
  Problem: Nutrition: Goal: Adequate nutrition will be maintained Outcome: Adequate for Discharge   Problem: Elimination: Goal: Will not experience complications related to bowel motility Outcome: Adequate for Discharge Goal: Will not experience complications related to urinary retention Outcome: Adequate for Discharge   Problem: Health Behavior/Discharge Planning: Goal: Ability to manage health-related needs will improve Outcome: Completed/Met   Problem: Clinical Measurements: Goal: Ability to maintain clinical measurements within normal limits will improve Outcome: Completed/Met Goal: Will remain free from infection Outcome: Completed/Met Goal: Diagnostic test results will improve Outcome: Completed/Met Goal: Respiratory complications will improve Outcome: Completed/Met   Problem: Activity: Goal: Risk for activity intolerance will decrease Outcome: Completed/Met   Problem: Safety: Goal: Ability to remain free from injury will improve Outcome: Completed/Met

## 2019-10-21 NOTE — Discharge Summary (Signed)
Physician Discharge Summary   Patient ID: Claudia Wiley MRN: [id number] DOB/AGE: [birth year] 62 y.o.  Admit date: 10/01/2019 Discharge date: 10/21/2019  Primary Care Physician:  Cl[facility], [facility]   Recommendations for Outpatient Follow-up:  1. Follow up with PCP in 1-2 weeks 2. Patient has completed fluconazole course 3. Continue PPI, Carafate as recommended by GI  Home Health: Resumed home health PT OT Equipment/Devices: None  Discharge Condition: stable  CODE STATUS: FULL Diet recommendation: Full liquid or very soft diet   Discharge Diagnoses:     Odynophagia Candida esophagitis Duodenal ulcers Severe protein calorie malnutrition Acute on chronic respiratory failure with hypercapnia Acute toxic encephalopathy Dehydration . Orthostasis . Pancreatic insufficiency . Essential hypertension . COPD (chronic obstructive pulmonary disease) (HCC)   Consults: Gastroenterology    Allergies:   Allergies  Allergen Reactions  . Azithromycin Other (See Comments)    Unknown   . Boniva [Ibandronic Acid] Other (See Comments)    unknown  . Gabapentin Other (See Comments)    unknown     DISCHARGE MEDICATIONS: Allergies as of 10/21/2019      Reactions   Azithromycin Other (See Comments)   Unknown   Boniva [ibandronic Acid] Other (See Comments)   unknown   Gabapentin Other (See Comments)   unknown      Medication List    TAKE these medications   acetaminophen 500 MG tablet Commonly known as: TYLENOL Take 1,000 mg by mouth every 6 (six) hours as needed for moderate pain or headache.   albuterol (2.5 MG/3ML) 0.083% nebulizer solution Commonly known as: PROVENTIL Take 2.5 mg by nebulization every 6 (six) hours as needed for wheezing or shortness of breath.   albuterol 108 (90 Base) MCG/ACT inhaler Commonly known as: VENTOLIN HFA Inhale 2 puffs into the lungs every 6 (six) hours as needed for wheezing or shortness of breath.   budesonide-formoterol 160-4.5  MCG/ACT inhaler Commonly known as: SYMBICORT Inhale 1 puff into the lungs daily.   calcium-vitamin D 500-200 MG-UNIT tablet Commonly known as: OSCAL WITH D Take 1 tablet by mouth 2 (two) times daily.   cholecalciferol 25 MCG (1000 UNIT) tablet Commonly known as: VITAMIN D3 Take 1,000 Units by mouth daily.   docusate sodium 100 MG capsule Commonly known as: COLACE Take 2 capsules (200 mg total) by mouth 2 (two) times daily.   Ensure Take 237 mLs by mouth 4 (four) times daily. Between Meals.   fluticasone 50 MCG/ACT nasal spray Commonly known as: FLONASE Place 1 spray into both nostrils daily as needed for allergies or rhinitis.   lidocaine 2 % solution Commonly known as: XYLOCAINE Use as directed 15 mLs in the mouth or throat 3 (three) times daily before meals.   lipase/protease/amylase 12000 units Cpep capsule Commonly known as: CREON Take 24,000 Units by mouth 4 (four) times daily.   mirtazapine 15 MG tablet Commonly known as: REMERON Take 15 mg by mouth at bedtime.   multivitamin tablet Take 1 tablet by mouth daily.   ondansetron 4 MG disintegrating tablet Commonly known as: Zofran ODT Take 1 tablet (4 mg total) by mouth every 8 (eight) hours as needed for nausea or vomiting.   oxyCODONE 20 MG/ML concentrated solution Commonly known as: ROXICODONE INTENSOL Take 0.3 mLs (6 mg total) by mouth every 6 (six) hours as needed for moderate pain or severe pain.   pantoprazole 40 MG tablet Commonly known as: PROTONIX Take 40 mg by mouth 2 (two) times daily.   sucralfate 1 GM/10ML suspension Commonly  known as: CARAFATE Take 10 mLs (1 g total) by mouth every 6 (six) hours.   tiotropium 18 MCG inhalation capsule Commonly known as: SPIRIVA Place 18 mcg into inhaler and inhale daily.   tiZANidine 4 MG capsule Commonly known as: ZANAFLEX Take 2 mg by mouth at bedtime.        Brief H and P: For complete details please refer to admission H and P, but in brief Claudia  Wiley a 64 y.o.female with a history COPD/chronic respiratory failure with hypoxia on 2 L supplemental O2 via [place], history of lung cancer s/p LUL lobectomy, gastrinoma s/p Whipple's procedure, anxiety/depression, esophageal stricture and dilatation July 2019, hypertension, and recent admission for severe Candida esophagitis with esophageal necrosis. She presented secondary to dehydration secondary to recurrent emesis and lack of intake secondary to odynophagia. Patient started on TPN for nutrition and high dose fluconazole for treatment of candida esophagitis.   Hospital Course:  Odynophagia secondary to Candida esophagitis, duodenal ulcers,  severe protein calorie malnutrition -Previously treated with full course of fluconazole, possible reoccurrence of Candida esophagitis -GI was consulted, performed EGD on 3/21.  EGD showed severe esophagitis with ulceration, repair/regeneration reaction with contact bleeding.  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  Patient was placed on TPN. -Repeat EGD on 4/6 showed improvement in previous severe esophagitis, mostly healed, benign-appearing esophageal stenosis, dilated -Patient completed full course of fluconazole, last dose on 10/21/2019, 20-day course per GI -TPN was weaned off, patient is tolerating full liquid diets and supplements.   -Continue Protonix, Carafate   Acute respiratory failure with hypercapnia, underlying history of chronic respiratory failure with hypoxia, COPD -Resolved.  Patient with pH of 7.2, PCO2 63.5, was placed on BiPAP -Currently at baseline  Acute toxic encephalopathy -Resolved.  Likely due to narcotics, initially was tolerating pain regimen but acutely worsened on 3/21 with increasing lethargy -Currently alert and oriented   Orthostatic hypotension Secondary to dehydration. Resolved.  Dehydration Improved  Anasarca -Likely due to severe protein  calorie malnutrition, hypoalbuminemia, poor nutritional status -2D echo with normal EF, on previous admission also had fluid retention, requiring Lasix   COPD Chronic respiratory failure with hypoxia Baseline on 2 L O2 via [place], continue Incruse, Dulera, albuterol    Day of Discharge S: Doing well, TPN team weaned off, hoping to go home.  Daughter at the bedside  BP 100/61 (BP Location: Left Arm)   Pulse 89   Temp 98.5 F (36.9 C) (Oral)   Resp 18   Ht 4' 11.02" (1.499 m)   Wt 34.7 kg   SpO2 98%   BMI 15.46 kg/m   Physical Exam: General: Alert and awake oriented x3 not in any acute distress. HEENT: anicteric sclera, pupils reactive to light and accommodation CVS: S1-S2 clear no murmur rubs or gallops Chest: clear to auscultation bilaterally, no wheezing rales or rhonchi Abdomen: soft nontender, nondistended, normal bowel sounds Extremities: no cyanosis, clubbing or edema noted bilaterally Neuro: Cranial nerves II-XII intact, no focal neurological deficits    Get Medicines reviewed and adjusted: Please take all your medications with you for your next visit with your Primary MD  Please request your Primary MD to go over all hospital tests and procedure/radiological results at the follow up. Please ask your Primary MD to get all Hospital records sent to his/her office.  If you experience worsening of your admission symptoms, develop shortness of breath, life threatening emergency, suicidal or homicidal thoughts you must seek medical attention  immediately by calling 911 or calling your MD immediately  if symptoms less severe.  You must read complete instructions/literature along with all the possible adverse reactions/side effects for all the Medicines you take and that have been prescribed to you. Take any new Medicines after you have completely understood and accept all the possible adverse reactions/side effects.   Do not drive when taking pain medications.   Do not take  more than prescribed Pain, Sleep and Anxiety Medications  Special Instructions: If you have smoked or chewed Tobacco  in the last 2 yrs please stop smoking, stop any regular Alcohol  and or any Recreational drug use.  Wear Seat belts while driving.  Please note  You were cared for by a hospitalist during your hospital stay. Once you are discharged, your primary care physician will handle any further medical issues. Please note that NO REFILLS for any discharge medications will be authorized once you are discharged, as it is imperative that you return to your primary care physician (or establish a relationship with a primary care physician if you do not have one) for your aftercare needs so that they can reassess your need for medications and monitor your lab values.   The results of significant diagnostics from this hospitalization (including imaging, microbiology, ancillary and laboratory) are listed below for reference.      Procedures/Studies:  CT CHEST WO CONTRAST  Result Date: 10/03/2019 CLINICAL DATA:  Pain status post EGD. EXAM: CT CHEST WITHOUT CONTRAST TECHNIQUE: Multidetector CT imaging of the chest was performed following the standard protocol without IV contrast. COMPARISON:  None. FINDINGS: Cardiovascular: Heart size is normal. There is a trace pericardial effusion. There are atherosclerotic changes of the thoracic aorta without evidence for a thoracic aortic aneurysm. There are coronary artery calcifications. Mediastinum/Nodes: --No mediastinal or hilar lymphadenopathy. --No axillary lymphadenopathy. --No supraclavicular lymphadenopathy. --Normal thyroid gland. --there is mild diffuse wall thickening of the distal esophagus which is nonspecific. Lungs/Pleura: Patient appears to be status post prior wedge resection involving the left upper lobe. There are moderate to severe emphysematous changes bilaterally. There is no pneumothorax. There is a small right-sided pleural effusion.  There is a trace left-sided pleural effusion. There is mild scattered bronchial wall thickening and mucus plugging bilaterally. Upper Abdomen: No acute abnormality. Musculoskeletal: No chest wall abnormality. No acute or significant osseous findings. IMPRESSION: 1. Mild diffuse esophageal wall thickening which is nonspecific but may represent esophagitis. There is no evidence for extraluminal contrast or free air to suggest a perforation. 2. Small right-sided pleural effusion.  No pneumothorax. 3. Moderate emphysematous changes. There are postsurgical changes in the left upper lobe related to prior resection. Aortic Atherosclerosis (ICD10-I70.0) and Emphysema (ICD10-J43.9). Electronically Signed   By: Constance Holster M.D.   On: 10/03/2019 22:48   DG CHEST PORT 1 VIEW  Result Date: 10/05/2019 CLINICAL DATA:  Shortness of breath. EXAM: PORTABLE CHEST 1 VIEW COMPARISON:  10/01/2019 and chest CT from 10/03/2019 FINDINGS: Nodular density overlying the right lower chest is most compatible with a nipple shadow based on the prior imaging. Stable hyperinflation. Sutures in the medial upper left lung. Stable scarring at the left lung base. A right arm PICC line has been placed. PICC line tip is in the lower SVC region. Heart size is stable. Trachea is midline. Negative for a pneumothorax. IMPRESSION: No acute cardiopulmonary disease. PICC line tip in the lower SVC region. Electronically Signed   By: Markus Daft M.D.   On: 10/05/2019 09:17   DG  Chest Portable 1 View  Result Date: 10/01/2019 CLINICAL DATA:  Shortness of breath. Esophageal pain. History of COPD, lung cancer, stomach cancer, partial pneumonectomy. EXAM: PORTABLE CHEST 1 VIEW COMPARISON:  09/10/2019 FINDINGS: Lungs are hyperinflated. No focal consolidations or pleural effusions. No pulmonary edema. Surgical clips overlie the LEFT hilar region. IMPRESSION: No evidence for acute abnormality.  Hyperinflation. Electronically Signed   By: Nolon Nations  M.D.   On: 10/01/2019 16:59   [place] EKG Site Rite  Result Date: 10/04/2019 If [facility] image not attached, placement could not be confirmed due to current cardiac rhythm.  DG ESOPHAGUS W SINGLE CM (SOL OR THIN BA)  Result Date: 10/12/2019 CLINICAL DATA:  Follow-up additional history obtained from the ordering provider: Esophageal necrosis with known stricture seen on recent EGD EXAM: ESOPHOGRAM/BARIUM SWALLOW TECHNIQUE: Single contrast examination was performed using thin barium. FLUOROSCOPY TIME:  Fluoroscopy Time:  1 minutes, 24 seconds Radiation Exposure Index (if provided by the fluoroscopic device): 3.9 mGY Number of Acquired Spot Images: 2 COMPARISON:  Esophagram 10/20/2011, CT chest 10/03/2019 FINDINGS: A thin barium single contrast esophagram was performed. Due to significant discomfort, the patient was unable to drink contrast at a rate that would adequately distend the mid to distal esophagus. This resulted in a significantly limited examination and precluded adequate evaluation for stenosis within the mid to distal esophagus. No high-grade fixed stricture was demonstrated within the proximal to mid esophagus. There was no appreciable mucosal abnormality, although evaluation was significantly limited on this single contrast exam. Small sliding hiatal hernia. No reflux was observed with the patient in the standing position. IMPRESSION: Due to significant discomfort, the patient was unable to drink contrast at a rate that would adequately distend the mid to distal esophagus. This resulted in a significantly limited examination and precluded adequate evaluation for stenosis within the mid to distal esophagus. No high-grade fixed stricture demonstrated within the proximal to mid esophagus Small sliding hiatal hernia. Electronically Signed   By: Kellie Simmering DO   On: 10/12/2019 15:23       LAB RESULTS: Basic Metabolic Panel: Recent Labs  Lab 10/20/19 0531 10/20/19 0531 10/21/19 0427  NA 140   --  137  K 4.3  --  4.1  CL 101  --  95*  CO2 33*  --  33*  GLUCOSE 122*  --  89  BUN 17  --  14  CREATININE 0.38*  --  0.43*  CALCIUM 9.5  --  9.3  MG 1.5*   < > 1.6*  PHOS 4.8*   < > 4.7*   < > = values in this interval not displayed.   Liver Function Tests: Recent Labs  Lab 10/18/19 0423  AST 43*  ALT 66*  ALKPHOS 94  BILITOT 0.2*  PROT 5.8*  ALBUMIN 3.1*   No results for input(s): LIPASE, AMYLASE in the last 168 hours. No results for input(s): AMMONIA in the last 168 hours. CBC: Recent Labs  Lab 10/18/19 0423  WBC 6.1  NEUTROABS 2.9  HGB 9.3*  HCT 30.9*  MCV 94.8  PLT 357   Cardiac Enzymes: No results for input(s): CKTOTAL, CKMB, CKMBINDEX, TROPONINI in the last 168 hours. BNP: Invalid input(s): POCBNP CBG: Recent Labs  Lab 10/20/19 0539 10/20/19 1148  GLUCAP 110* 125*       Disposition and Follow-up: Discharge Instructions    Discharge instructions   Complete by: As directed    Full liquid or very soft diet until outpatient follow-up with gastroenterology  Increase activity slowly   Complete by: As directed        DISPOSITION: Home with home health PT   [facility], [place]. Schedule an appointment as soon as possible for a visit in 2 week(s).   Contact information: [place] [id number]        [name], MD Follow up on 11/16/2019.   Specialty: Gastroenterology Why: at 1:50 PM  Contact information: [address] [place] [address] [phone]            Time coordinating discharge:  35 minutes   Signed:   Estill Cotta M.D. [facility] 10/21/2019, 1:14 PM

## 2019-10-21 NOTE — Progress Notes (Signed)
PT Cancellation Note  Patient Details Name: Claudia Wiley MRN: [id number] DOB: 03/13/1956   Cancelled Treatment:    Reason Eval/Treat Not Completed: Patient declined, no reason specified Pt declined reporting d/c home today, daughter in room and confirms.     [name],KATHrine E 10/21/2019, 11:33 AM [name], DPT Acute Rehabilitation Services Office: [phone]

## 2019-10-25 ENCOUNTER — Other Ambulatory Visit: Payer: Self-pay

## 2019-10-25 ENCOUNTER — Telehealth: Payer: Self-pay

## 2019-10-25 DIAGNOSIS — R1310 Dysphagia, unspecified: Secondary | ICD-10-CM

## 2019-10-25 NOTE — Telephone Encounter (Signed)
Patient and her daughter notified of  Upcoming plans for serial EGD with dilations at the hospital. She will have EGD on 11/02/19 at 9:15 with Dr. Henrene Pastor at [facility].  She will go for her COVID screen on 10/29/19 10:00.  She will have a repeat EGD on 11/16/19 at 12:00 with Dr. Fuller Plan and have her COVID screen on 11/12/19 10:45.  She is asked to arrive 1:30 before her procedures and be NPO after midnight.

## 2019-10-25 NOTE — Addendum Note (Signed)
Addended by: [name] on: 10/25/2019 09:48 AM   Modules accepted: Orders, SmartSet

## 2019-10-25 NOTE — Telephone Encounter (Signed)
-----   Message from [name], MD sent at 10/25/2019  2:37 PM EDT ----- Regarding: RE: Follow-up EGD with dilation/hospital Ok to follow my first case. EGD with Savary dilation. MUST have fluoroscopy. Thanks, JP ----- Message ----- From: [name], RN Sent: 10/22/2019   8:45 AM EDT To: [name], PA-C, [name], MD, # Subject: RE: Follow-up EGD with dilation/hospital       Dr. Henrene Pastor, you are the [facility] hospital endo MD on 4/20.  Would you be willing to do this patient for Dr. Fuller Plan.  I am not able to add the patient on to 5/4 because this is Dr. Lorie Apley am slot at 7:30  [facility] ----- Message ----- From: [name], MD Sent: 10/22/2019   8:10 AM EDT To: [name], RN, Amy Genia Harold, PA-C Subject: RE: Follow-up EGD with dilation/hospital       Please add 0730 on 5/4 at [facility] for me. Reviewing last EGD on 4/6 and last progress note SA recommended repeat dilation 2 weeks from last EGD which would be around 4/20. I want to proceed with that schedule.   Given the severity of her stricture, EGD/dilation around 4/20 in hospital and again on 5/4 in hospital would be best. Then schedule the next EGD/dilation with me about 2-3 weeks after 5/4 in the [place].  Please ask if SA, VC, JMP, who have all done EGDs recently for this patient, can schedule a hospital EGD/dilation around 4/20. If they aren't available then please ask any doc who has hospital time around 4/20 to do this EGD/dilation.   Thanks. ----- Message ----- From: [name], RN Sent: 10/20/2019  11:19 AM EDT To: [name], PA-C, [name], MD Subject: RE: Follow-up EGD with dilation/hospital       Dr. Fuller Plan your next day 5/4 is full unless you want to do a 5th case.  You are in the office that afternoon.  Winifred Bodiford ----- Message ----- From: [name], PA-C Sent: 10/20/2019  10:22 AM EDT To: [name], RN, [name], MD Subject: Follow-up EGD with dilation/hospital           [name]  and Garen Woolbright-this patient is currently hospitalized at [facility], she had acute esophageal necrosis earlier this year, and has been back in the hospital over the past few weeks with malnutrition, failure to thrive odynophagia and dysphagia.  Dr. Fuller Plan his primary GI MD  Relook EGD yesterday per Dr. Havery Moros with significant improvement in esophagitis, almost completely healed, she has developed a distal severe stricture, dilated with peds scope and then to 7 mm yesterday.  She will probably be discharged in the next few days, it is felt she will need repeat EGD with dilation in about 2 weeks, then will need serial dilations over the next couple of months hopefully eventually can be done at [facility].  Please try to get her scheduled for follow-up procedure at the hospital in about 2 weeks, I told the patient she would probably be called by our office later this week with details.

## 2019-10-25 NOTE — Telephone Encounter (Signed)
-----   Message from [name], MD sent at 10/25/2019  2:37 PM EDT ----- Regarding: RE: Follow-up EGD with dilation/hospital Ok to follow my first case. EGD with Savary dilation. MUST have fluoroscopy. Thanks, JP ----- Message ----- From: [name], RN Sent: 10/22/2019   8:45 AM EDT To: [name], PA-C, [name], MD, # Subject: RE: Follow-up EGD with dilation/hospital       Dr. Henrene Pastor, you are the [facility] hospital endo MD on 4/20.  Would you be willing to do this patient for Dr. Fuller Plan.  I am not able to add the patient on to 5/4 because this is Dr. Lorie Apley am slot at 7:30  [facility] ----- Message ----- From: [name], MD Sent: 10/22/2019   8:10 AM EDT To: [name], RN, Amy Genia Harold, PA-C Subject: RE: Follow-up EGD with dilation/hospital       Please add 0730 on 5/4 at [facility] for me. Reviewing last EGD on 4/6 and last progress note SA recommended repeat dilation 2 weeks from last EGD which would be around 4/20. I want to proceed with that schedule.   Given the severity of her stricture, EGD/dilation around 4/20 in hospital and again on 5/4 in hospital would be best. Then schedule the next EGD/dilation with me about 2-3 weeks after 5/4 in the [place].  Please ask if SA, VC, JMP, who have all done EGDs recently for this patient, can schedule a hospital EGD/dilation around 4/20. If they aren't available then please ask any doc who has hospital time around 4/20 to do this EGD/dilation.   Thanks. ----- Message ----- From: [name], RN Sent: 10/20/2019  11:19 AM EDT To: [name], PA-C, [name], MD Subject: RE: Follow-up EGD with dilation/hospital       Dr. Fuller Plan your next day 5/4 is full unless you want to do a 5th case.  You are in the office that afternoon.  Bralon Antkowiak ----- Message ----- From: [name], PA-C Sent: 10/20/2019  10:22 AM EDT To: [name], RN, [name], MD Subject: Follow-up EGD with dilation/hospital           [name]  and [name]-this patient is currently hospitalized at [facility], she had acute esophageal necrosis earlier this year, and has been back in the hospital over the past few weeks with malnutrition, failure to thrive odynophagia and dysphagia.  Dr. Fuller Plan his primary GI MD  Relook EGD yesterday per Dr. Havery Moros with significant improvement in esophagitis, almost completely healed, she has developed a distal severe stricture, dilated with peds scope and then to 7 mm yesterday.  She will probably be discharged in the next few days, it is felt she will need repeat EGD with dilation in about 2 weeks, then will need serial dilations over the next couple of months hopefully eventually can be done at [facility].  Please try to get her scheduled for follow-up procedure at the hospital in about 2 weeks, I told the patient she would probably be called by our office later this week with details.

## 2019-10-29 ENCOUNTER — Other Ambulatory Visit (HOSPITAL_COMMUNITY)
Admission: RE | Admit: 2019-10-29 | Discharge: 2019-10-29 | Disposition: A | Discharge status: Home / Self Care | Payer: No Typology Code available for payment source | Source: Ambulatory Visit | Attending: Internal Medicine | Admitting: Internal Medicine

## 2019-10-29 DIAGNOSIS — Z01812 Encounter for preprocedural laboratory examination: Secondary | ICD-10-CM | POA: Insufficient documentation

## 2019-10-29 DIAGNOSIS — Z20822 Contact with and (suspected) exposure to covid-19: Secondary | ICD-10-CM | POA: Diagnosis not present

## 2019-10-29 LAB — SARS CORONAVIRUS 2 (TAT 6-24 HRS): SARS Coronavirus 2: NEGATIVE

## 2019-11-02 ENCOUNTER — Encounter (HOSPITAL_COMMUNITY)
Admission: RE | Disposition: A | Discharge status: Home / Self Care | Payer: Self-pay | Source: Home / Self Care | Attending: Internal Medicine

## 2019-11-02 ENCOUNTER — Other Ambulatory Visit: Payer: Self-pay

## 2019-11-02 ENCOUNTER — Ambulatory Visit (HOSPITAL_COMMUNITY)
Admission: RE | Admit: 2019-11-02 | Discharge: 2019-11-02 | Disposition: A | Discharge status: Home / Self Care | Payer: No Typology Code available for payment source | Attending: Internal Medicine | Admitting: Internal Medicine

## 2019-11-02 ENCOUNTER — Encounter (HOSPITAL_COMMUNITY): Payer: Self-pay | Admitting: Internal Medicine

## 2019-11-02 ENCOUNTER — Ambulatory Visit (HOSPITAL_COMMUNITY): Payer: No Typology Code available for payment source | Admitting: Anesthesiology

## 2019-11-02 ENCOUNTER — Ambulatory Visit (HOSPITAL_COMMUNITY): Payer: No Typology Code available for payment source

## 2019-11-02 DIAGNOSIS — Z902 Acquired absence of lung [part of]: Secondary | ICD-10-CM | POA: Diagnosis not present

## 2019-11-02 DIAGNOSIS — I10 Essential (primary) hypertension: Secondary | ICD-10-CM | POA: Diagnosis not present

## 2019-11-02 DIAGNOSIS — K228 Other specified diseases of esophagus: Secondary | ICD-10-CM | POA: Insufficient documentation

## 2019-11-02 DIAGNOSIS — J449 Chronic obstructive pulmonary disease, unspecified: Secondary | ICD-10-CM | POA: Diagnosis not present

## 2019-11-02 DIAGNOSIS — K2100 Gastro-esophageal reflux disease with esophagitis, without bleeding: Secondary | ICD-10-CM | POA: Diagnosis not present

## 2019-11-02 DIAGNOSIS — Z9981 Dependence on supplemental oxygen: Secondary | ICD-10-CM | POA: Diagnosis not present

## 2019-11-02 DIAGNOSIS — K3184 Gastroparesis: Secondary | ICD-10-CM | POA: Insufficient documentation

## 2019-11-02 DIAGNOSIS — B3781 Candidal esophagitis: Secondary | ICD-10-CM | POA: Diagnosis not present

## 2019-11-02 DIAGNOSIS — Z681 Body mass index (BMI) 19 or less, adult: Secondary | ICD-10-CM | POA: Diagnosis not present

## 2019-11-02 DIAGNOSIS — K449 Diaphragmatic hernia without obstruction or gangrene: Secondary | ICD-10-CM | POA: Diagnosis not present

## 2019-11-02 DIAGNOSIS — K2210 Ulcer of esophagus without bleeding: Secondary | ICD-10-CM | POA: Insufficient documentation

## 2019-11-02 DIAGNOSIS — R1310 Dysphagia, unspecified: Secondary | ICD-10-CM | POA: Diagnosis not present

## 2019-11-02 DIAGNOSIS — E43 Unspecified severe protein-calorie malnutrition: Secondary | ICD-10-CM | POA: Diagnosis not present

## 2019-11-02 DIAGNOSIS — R627 Adult failure to thrive: Secondary | ICD-10-CM | POA: Diagnosis not present

## 2019-11-02 DIAGNOSIS — K222 Esophageal obstruction: Secondary | ICD-10-CM | POA: Insufficient documentation

## 2019-11-02 DIAGNOSIS — Z87891 Personal history of nicotine dependence: Secondary | ICD-10-CM | POA: Diagnosis not present

## 2019-11-02 HISTORY — PX: ESOPHAGOGASTRODUODENOSCOPY (EGD) WITH PROPOFOL: SHX5813

## 2019-11-02 HISTORY — PX: SAVORY DILATION: SHX5439

## 2019-11-02 SURGERY — ESOPHAGOGASTRODUODENOSCOPY (EGD) WITH PROPOFOL
Anesthesia: Monitor Anesthesia Care

## 2019-11-02 MED ORDER — PROPOFOL 500 MG/50ML IV EMUL
INTRAVENOUS | Status: DC | PRN
  Administered 2019-11-02: 125 ug/kg/min via INTRAVENOUS

## 2019-11-02 MED ORDER — PROPOFOL 10 MG/ML IV BOLUS
INTRAVENOUS | Status: AC
  Filled 2019-11-02: qty 20

## 2019-11-02 MED ORDER — LIDOCAINE 2% (20 MG/ML) 5 ML SYRINGE
INTRAMUSCULAR | Status: DC | PRN
  Administered 2019-11-02: 60 mg via INTRAVENOUS

## 2019-11-02 MED ORDER — SODIUM CHLORIDE 0.9 % IV SOLN: INTRAVENOUS | Status: DC

## 2019-11-02 MED ORDER — PROPOFOL 10 MG/ML IV BOLUS
INTRAVENOUS | Status: DC | PRN
  Administered 2019-11-02 (×4): 20 mg via INTRAVENOUS

## 2019-11-02 MED ORDER — LACTATED RINGERS IV SOLN
INTRAVENOUS | Status: DC
  Administered 2019-11-02: 1000 mL via INTRAVENOUS

## 2019-11-02 SURGICAL SUPPLY — 14 items

## 2019-11-02 NOTE — Discharge Instructions (Signed)
YOU HAD AN ENDOSCOPIC PROCEDURE TODAY: Refer to the procedure report and other information in the discharge instructions given to you for any specific questions about what was found during the examination. If this information does not answer your questions, please call [place] office at [phone] to clarify.   YOU SHOULD EXPECT: Some feelings of bloating in the abdomen. Passage of more gas than usual. Walking can help get rid of the air that was put into your GI tract during the procedure and reduce the bloating. If you had a lower endoscopy (such as a colonoscopy or flexible sigmoidoscopy) you may notice spotting of blood in your stool or on the toilet paper. Some abdominal soreness may be present for a day or two, also.  DIET: Nothing to eat or drink for 2 hours,the clear liquids for 2 hours , the soft foods today. Your first meal following the procedure should be a light meal and then it is ok to progress to your normal diet. A half-sandwich or bowl of soup is an example of a good first meal. Heavy or fried foods are harder to digest and may make you feel nauseous or bloated. Drink plenty of fluids but you should avoid alcoholic beverages for 24 hours. If you had a esophageal dilation, please see attached instructions for diet.    ACTIVITY: Your care partner should take you home directly after the procedure. You should plan to take it easy, moving slowly for the rest of the day. You can resume normal activity the day after the procedure however YOU SHOULD NOT DRIVE, use power tools, machinery or perform tasks that involve climbing or major physical exertion for 24 hours (because of the sedation medicines used during the test).   SYMPTOMS TO REPORT IMMEDIATELY: A gastroenterologist can be reached at any hour. Please call [phone]  for any of the following symptoms:  Following lower endoscopy (colonoscopy, flexible sigmoidoscopy) Excessive amounts of blood in the stool  Significant tenderness,  worsening of abdominal pains  Swelling of the abdomen that is new, acute  Fever of 100 or higher  Following upper endoscopy (EGD, EUS, ERCP, esophageal dilation) Vomiting of blood or coffee ground material  New, significant abdominal pain  New, significant chest pain or pain under the shoulder blades  Painful or persistently difficult swallowing  New shortness of breath  Black, tarry-looking or red, bloody stools  FOLLOW UP:  If any biopsies were taken you will be contacted by phone or by letter within the next 1-3 weeks. Call [phone]  if you have not heard about the biopsies in 3 weeks.  Please also call with any specific questions about appointments or follow up tests.

## 2019-11-02 NOTE — Transfer of Care (Signed)
Immediate Anesthesia Transfer of Care Note  Patient: Claudia Wiley  Procedure(s) Performed: ESOPHAGOGASTRODUODENOSCOPY (EGD) WITH FLUORO AND  WITH PROPOFOL (N/A ) SAVORY DILATION (N/A )  Patient Location: PACU  Anesthesia Type:MAC  Level of Consciousness: awake, alert  and oriented  Airway & Oxygen Therapy: Patient Spontanous Breathing and Patient connected to nasal cannula oxygen  Post-op Assessment: Report given to RN and Post -op Vital signs reviewed and stable  Post vital signs: Reviewed and stable  Last Vitals:  Vitals Value Taken Time  BP    Temp    Pulse    Resp    SpO2      Last Pain:  Vitals:   11/02/19 0804  TempSrc: Oral  PainSc: 0-No pain         Complications: No apparent anesthesia complications

## 2019-11-02 NOTE — Interval H&P Note (Signed)
History and Physical Interval Note:  11/02/2019 9:28 AM  Claudia Wiley  has presented today for surgery, with the diagnosis of dysphagia.  The various methods of treatment have been discussed with the patient and family. After consideration of risks, benefits and other options for treatment, the patient has consented to  Procedure(s) with comments: ESOPHAGOGASTRODUODENOSCOPY (EGD) WITH FLUORO AND  WITH PROPOFOL (N/A) - need fluoro SAVORY DILATION (N/A) - fluoro as a surgical intervention.  The patient's history has been reviewed, patient examined, no change in status, stable for surgery.  I have reviewed the patient's chart and labs.  Questions were answered to the patient's satisfaction.     Scarlette Shorts

## 2019-11-02 NOTE — Anesthesia Preprocedure Evaluation (Signed)
Anesthesia Evaluation  Patient identified by MRN, date of birth, ID band Patient awake    Reviewed: Allergy & Precautions, NPO status , Patient's Chart, lab work & pertinent test results  History of Anesthesia Complications Negative for: history of anesthetic complications  Airway Mallampati: II  TM Distance: >3 FB Neck ROM: Full    Dental  (+) Edentulous Upper, Edentulous Lower   Pulmonary pneumonia, resolved, COPD,  COPD inhaler and oxygen dependent, former smoker,    Pulmonary exam normal breath sounds clear to auscultation       Cardiovascular hypertension, Pt. on medications Normal cardiovascular exam Rhythm:Regular Rate:Normal     Neuro/Psych negative neurological ROS  negative psych ROS   GI/Hepatic Neg liver ROS, GERD  Medicated,Erosive candidal esophagitis  Gastrinoma s/p Whipple Dysphagia Hx/o esophageal stricture   Endo/Other  Protein Calorie malnutrition  Renal/GU negative Renal ROS  negative genitourinary   Musculoskeletal  (+) Arthritis , Osteoarthritis,    Abdominal   Peds  Hematology negative hematology ROS (+) anemia ,   Anesthesia Other Findings  Echo 09/16/19: EF 60-65%, normal RV, mild MR  Reproductive/Obstetrics                             Anesthesia Physical  Anesthesia Plan  ASA: IV  Anesthesia Plan: MAC   Post-op Pain Management:    Induction: Intravenous  PONV Risk Score and Plan: 2 and Propofol infusion, TIVA and Treatment may vary due to age or medical condition  Airway Management Planned: Natural Airway and Nasal Cannula  Additional Equipment: None  Intra-op Plan:   Post-operative Plan:   Informed Consent: I have reviewed the patients History and Physical, chart, labs and discussed the procedure including the risks, benefits and alternatives for the proposed anesthesia with the patient or authorized representative who has indicated his/her  understanding and acceptance.       Plan Discussed with: CRNA and Surgeon  Anesthesia Plan Comments:         Anesthesia Quick Evaluation

## 2019-11-02 NOTE — Progress Notes (Signed)
Dr Henrene Pastor evaluated patient for post dilation no intervention needed ,patient to continue meds as direction ,anticated post procedure pain improvement as day progress, patient reported pain prior to procedure.

## 2019-11-02 NOTE — Anesthesia Postprocedure Evaluation (Signed)
Anesthesia Post Note  Patient: Claudia Wiley  Procedure(s) Performed: ESOPHAGOGASTRODUODENOSCOPY (EGD) WITH FLUORO AND  WITH PROPOFOL (N/A ) SAVORY DILATION (N/A )     Anesthesia Post Evaluation  Last Vitals:  Vitals:   11/02/19 0804 11/02/19 1007  BP: (!) 126/93 135/79  Pulse: (!) 107 98  Resp: 18 (!) 25  Temp: 37 C   SpO2: 99% 100%    Last Pain:  Vitals:   11/02/19 1007  TempSrc:   PainSc: 10-Worst pain ever                 Eliseo Withers A.

## 2019-11-02 NOTE — Op Note (Signed)
[facility] Patient Name: Claudia Wiley Procedure Date: 11/02/2019 MRN: [id number] Attending MD: Docia Chuck. Henrene Pastor , MD Date of Birth: [birth year] CSN: [id number] Age: 64 Admit Type: Outpatient Procedure:                Upper GI endoscopy with savary dilation of the                            esophagus. 9, 10, 11 mm Indications:              Therapeutic procedure, Dysphagia Providers:                Docia Chuck. Henrene Pastor, MD, Benetta Spar RN, RN, Laverda Sorenson, Technician, [facility], CRNA Referring MD:              Medicines:                Monitored Anesthesia Care Complications:            No immediate complications. Estimated Blood Loss:     Estimated blood loss: none. Procedure:                Pre-Anesthesia Assessment:                           - Prior to the procedure, a History and Physical                            was performed, and patient medications and                            allergies were reviewed. The patient's tolerance of                            previous anesthesia was also reviewed. The risks                            and benefits of the procedure and the sedation                            options and risks were discussed with the patient.                            All questions were answered, and informed consent                            was obtained. Prior Anticoagulants: The patient has                            taken no previous anticoagulant or antiplatelet                            agents. ASA Grade Assessment: II - A patient with  mild systemic disease. After reviewing the risks                            and benefits, the patient was deemed in                            satisfactory condition to undergo the procedure.                           After obtaining informed consent, the endoscope was                            passed under direct vision. Throughout the             procedure, the patient's blood pressure, pulse, and                            oxygen saturations were monitored continuously. The                            [id number]-[id number]) Olympus gastroscope was                            introduced through the mouth, and advanced to the                            second part of duodenum. The upper GI endoscopy was                            accomplished without difficulty. The patient                            tolerated the procedure well. Scope In: Scope Out: Findings:      Many benign-appearing, intrinsic severe ringed stenoses were found       throughout the esophagus. In the proximal and midesophagus these were       traversed without resistance. However there was a high-grade stenosis in       the distal esophagus with mucosal ulceration (see image). Standard       endoscope would not pass beyond this region. The stenoses were traversed       after dilation as described. A guidewire was placed under fluoroscopic       guidance and the scope was withdrawn. Dilation was performed with a       Savary dilator with no resistance at 9, 10, and 11 mm. The dilation site       was examined following endoscope reinsertion and showed moderate mucosal       disruption which was contained and marked improvement in the stricture       diameter.      The stomach was normal save a small hiatal hernia.      The examined duodenum was normal.      The cardia and gastric fundus were normal on retroflexion. Impression:               1. Multiple esophageal rings throughout and  high-grade distal esophageal stricture with                            ulceration status post sequential Savary dilation                            to a maximal diameter of 11 mm.                           2. Otherwise unremarkable EGD Moderate Sedation:      none Recommendation:           1. Patient has a contact number available for                             emergencies. The signs and symptoms of potential                            delayed complications were discussed with the                            patient. Return to normal activities tomorrow.                            Written discharge instructions were provided to the                            patient.                           2. Post dilation diet.                           3. Continue present medications.                           4. Keep plans for repeat upper endoscopy with                            esophageal dilation with Dr. Fuller Plan as scheduled Nov 16, 2019 Procedure Code(s):        --- Professional ---                           [phone], Esophagogastroduodenoscopy, flexible,                            transoral; with insertion of guide wire followed by                            passage of dilator(s) through esophagus over guide                            wire Diagnosis Code(s):        --- Professional ---  K22.2, Esophageal obstruction                           R13.10, Dysphagia, unspecified CPT copyright 2019 [employer]. All rights reserved. The codes documented in this report are preliminary and upon coder review may  be revised to meet current compliance requirements. Docia Chuck. Henrene Pastor, MD 11/02/2019 10:17:18 AM This report has been signed electronically. Number of Addenda: 0

## 2019-11-03 ENCOUNTER — Encounter: Payer: Self-pay | Admitting: *Deleted

## 2019-11-12 ENCOUNTER — Other Ambulatory Visit (HOSPITAL_COMMUNITY)
Admission: RE | Admit: 2019-11-12 | Discharge: 2019-11-12 | Disposition: A | Discharge status: Home / Self Care | Payer: No Typology Code available for payment source | Source: Ambulatory Visit | Attending: Gastroenterology | Admitting: Gastroenterology

## 2019-11-12 DIAGNOSIS — Z01812 Encounter for preprocedural laboratory examination: Secondary | ICD-10-CM | POA: Insufficient documentation

## 2019-11-12 DIAGNOSIS — Z20822 Contact with and (suspected) exposure to covid-19: Secondary | ICD-10-CM | POA: Insufficient documentation

## 2019-11-12 LAB — SARS CORONAVIRUS 2 (TAT 6-24 HRS): SARS Coronavirus 2: NEGATIVE

## 2019-11-16 ENCOUNTER — Ambulatory Visit: Payer: No Typology Code available for payment source | Admitting: Gastroenterology

## 2019-11-16 ENCOUNTER — Other Ambulatory Visit: Payer: Self-pay

## 2019-11-16 ENCOUNTER — Encounter (HOSPITAL_COMMUNITY)
Admission: RE | Disposition: A | Discharge status: Home / Self Care | Payer: Self-pay | Source: Home / Self Care | Attending: Gastroenterology

## 2019-11-16 ENCOUNTER — Ambulatory Visit (HOSPITAL_COMMUNITY): Payer: No Typology Code available for payment source | Admitting: Certified Registered"

## 2019-11-16 ENCOUNTER — Encounter (HOSPITAL_COMMUNITY): Payer: Self-pay | Admitting: Gastroenterology

## 2019-11-16 ENCOUNTER — Ambulatory Visit (HOSPITAL_COMMUNITY)
Admission: RE | Admit: 2019-11-16 | Discharge: 2019-11-16 | Disposition: A | Discharge status: Home / Self Care | Payer: No Typology Code available for payment source | Attending: Gastroenterology | Admitting: Gastroenterology

## 2019-11-16 DIAGNOSIS — K2210 Ulcer of esophagus without bleeding: Secondary | ICD-10-CM | POA: Diagnosis not present

## 2019-11-16 DIAGNOSIS — K222 Esophageal obstruction: Secondary | ICD-10-CM | POA: Diagnosis not present

## 2019-11-16 DIAGNOSIS — K449 Diaphragmatic hernia without obstruction or gangrene: Secondary | ICD-10-CM | POA: Diagnosis not present

## 2019-11-16 DIAGNOSIS — J449 Chronic obstructive pulmonary disease, unspecified: Secondary | ICD-10-CM | POA: Diagnosis not present

## 2019-11-16 DIAGNOSIS — R1310 Dysphagia, unspecified: Secondary | ICD-10-CM | POA: Diagnosis not present

## 2019-11-16 DIAGNOSIS — Z87891 Personal history of nicotine dependence: Secondary | ICD-10-CM | POA: Insufficient documentation

## 2019-11-16 DIAGNOSIS — I10 Essential (primary) hypertension: Secondary | ICD-10-CM | POA: Diagnosis not present

## 2019-11-16 DIAGNOSIS — M1990 Unspecified osteoarthritis, unspecified site: Secondary | ICD-10-CM | POA: Diagnosis not present

## 2019-11-16 DIAGNOSIS — E43 Unspecified severe protein-calorie malnutrition: Secondary | ICD-10-CM | POA: Insufficient documentation

## 2019-11-16 HISTORY — PX: ESOPHAGOGASTRODUODENOSCOPY (EGD) WITH PROPOFOL: SHX5813

## 2019-11-16 HISTORY — PX: SAVORY DILATION: SHX5439

## 2019-11-16 SURGERY — ESOPHAGOGASTRODUODENOSCOPY (EGD) WITH PROPOFOL
Anesthesia: Monitor Anesthesia Care

## 2019-11-16 MED ORDER — PROPOFOL 10 MG/ML IV BOLUS
INTRAVENOUS | Status: DC | PRN
  Administered 2019-11-16 (×3): 20 mg via INTRAVENOUS

## 2019-11-16 MED ORDER — ONDANSETRON HCL 4 MG/2ML IJ SOLN
INTRAMUSCULAR | Status: AC
  Filled 2019-11-16: qty 2

## 2019-11-16 MED ORDER — PROPOFOL 500 MG/50ML IV EMUL
INTRAVENOUS | Status: DC | PRN
  Administered 2019-11-16: 125 ug/kg/min via INTRAVENOUS

## 2019-11-16 MED ORDER — SODIUM CHLORIDE 0.9 % IV SOLN: INTRAVENOUS | Status: DC

## 2019-11-16 MED ORDER — LACTATED RINGERS IV SOLN
INTRAVENOUS | Status: DC
  Administered 2019-11-16: 1000 mL via INTRAVENOUS

## 2019-11-16 MED ORDER — LIDOCAINE 2% (20 MG/ML) 5 ML SYRINGE
INTRAMUSCULAR | Status: DC | PRN
  Administered 2019-11-16: 40 mg via INTRAVENOUS

## 2019-11-16 MED ORDER — ONDANSETRON HCL 4 MG/2ML IJ SOLN
4.0000 mg | Freq: Once | INTRAMUSCULAR | Status: AC
  Administered 2019-11-16: 4 mg via INTRAVENOUS

## 2019-11-16 SURGICAL SUPPLY — 14 items

## 2019-11-16 NOTE — Anesthesia Postprocedure Evaluation (Signed)
Anesthesia Post Note  Patient: Claudia Wiley  Procedure(s) Performed: ESOPHAGOGASTRODUODENOSCOPY (EGD) WITH PROPOFOL (N/A ) SAVORY DILATION (N/A )     Patient location during evaluation: PACU Anesthesia Type: MAC Level of consciousness: awake and alert Pain management: pain level controlled Vital Signs Assessment: post-procedure vital signs reviewed and stable Respiratory status: spontaneous breathing, nonlabored ventilation and respiratory function stable Cardiovascular status: stable and blood pressure returned to baseline Anesthetic complications: no    Last Vitals:  Vitals:   11/16/19 1040 11/16/19 1050  BP: (!) 153/90 135/76  Pulse: 72 72  Resp: 16 15  Temp:    SpO2: 100% 100%    Last Pain:  Vitals:   11/16/19 1050  TempSrc:   PainSc: 0-No pain                 Audry Pili

## 2019-11-16 NOTE — Anesthesia Procedure Notes (Signed)
Procedure Name: MAC Date/Time: 11/16/2019 9:48 AM Performed by: Eben Burow, CRNA Pre-anesthesia Checklist: Patient identified, Emergency Drugs available, Suction available, Patient being monitored and Timeout performed Oxygen Delivery Method: Nasal cannula Dental Injury: Teeth and Oropharynx as per pre-operative assessment

## 2019-11-16 NOTE — Anesthesia Preprocedure Evaluation (Addendum)
Anesthesia Evaluation  Patient identified by MRN, date of birth, ID band Patient awake    Reviewed: Allergy & Precautions, NPO status , Patient's Chart, lab work & pertinent test results  History of Anesthesia Complications Negative for: history of anesthetic complications  Airway Mallampati: II  TM Distance: >3 FB Neck ROM: Full    Dental  (+) Edentulous Lower, Edentulous Upper   Pulmonary COPD (prn home oxygen),  COPD inhaler, former smoker,   Lung cancer s/p resection    Pulmonary exam normal        Cardiovascular hypertension, Normal cardiovascular exam     Neuro/Psych negative neurological ROS  negative psych ROS   GI/Hepatic Neg liver ROS, GERD  Medicated and Controlled, Hx gastrinoma Malnourishment; BMI <15   Endo/Other  negative endocrine ROS  Renal/GU negative Renal ROS     Musculoskeletal  (+) Arthritis ,   Abdominal   Peds  Hematology negative hematology ROS (+)   Anesthesia Other Findings Covid neg 4/30  Reproductive/Obstetrics                            Anesthesia Physical Anesthesia Plan  ASA: III  Anesthesia Plan: MAC   Post-op Pain Management:    Induction: Intravenous  PONV Risk Score and Plan: 2 and Propofol infusion and Treatment may vary due to age or medical condition  Airway Management Planned: Nasal Cannula and Natural Airway  Additional Equipment: None  Intra-op Plan:   Post-operative Plan:   Informed Consent: I have reviewed the patients History and Physical, chart, labs and discussed the procedure including the risks, benefits and alternatives for the proposed anesthesia with the patient or authorized representative who has indicated his/her understanding and acceptance.       Plan Discussed with: CRNA and Anesthesiologist  Anesthesia Plan Comments:        Anesthesia Quick Evaluation

## 2019-11-16 NOTE — Discharge Instructions (Signed)
YOU HAD AN ENDOSCOPIC PROCEDURE TODAY: Refer to the procedure report and other information in the discharge instructions given to you for any specific questions about what was found during the examination. If this information does not answer your questions, please call [place] office at [phone] to clarify.   YOU SHOULD EXPECT: Some feelings of bloating in the abdomen. Passage of more gas than usual. Walking can help get rid of the air that was put into your GI tract during the procedure and reduce the bloating. If you had a lower endoscopy (such as a colonoscopy or flexible sigmoidoscopy) you may notice spotting of blood in your stool or on the toilet paper. Some abdominal soreness may be present for a day or two, also.  DIET: Your first meal following the procedure should be a light meal and then it is ok to progress to your normal diet. A half-sandwich or bowl of soup is an example of a good first meal. Heavy or fried foods are harder to digest and may make you feel nauseous or bloated. Drink plenty of fluids but you should avoid alcoholic beverages for 24 hours. If you had a esophageal dilation, please see attached instructions for diet.    ACTIVITY: Your care partner should take you home directly after the procedure. You should plan to take it easy, moving slowly for the rest of the day. You can resume normal activity the day after the procedure however YOU SHOULD NOT DRIVE, use power tools, machinery or perform tasks that involve climbing or major physical exertion for 24 hours (because of the sedation medicines used during the test).   SYMPTOMS TO REPORT IMMEDIATELY: A gastroenterologist can be reached at any hour. Please call [phone]  for any of the following symptoms:   Following upper endoscopy (EGD, EUS, ERCP, esophageal dilation) Vomiting of blood or coffee ground material  New, significant abdominal pain  New, significant chest pain or pain under the shoulder blades  Painful or  persistently difficult swallowing  New shortness of breath  Black, tarry-looking or red, bloody stools  FOLLOW UP:  If any biopsies were taken you will be contacted by phone or by letter within the next 1-3 weeks. Call [phone]  if you have not heard about the biopsies in 3 weeks.  Please also call with any specific questions about appointments or follow up tests.  

## 2019-11-16 NOTE — Transfer of Care (Signed)
Immediate Anesthesia Transfer of Care Note  Patient: [place]  Procedure(s) Performed: ESOPHAGOGASTRODUODENOSCOPY (EGD) WITH PROPOFOL (N/A ) SAVORY DILATION (N/A )  Patient Location: PACU and Endoscopy Unit  Anesthesia Type:MAC  Level of Consciousness: awake, alert  and patient cooperative  Airway & Oxygen Therapy: Patient Spontanous Breathing and Patient connected to nasal cannula oxygen  Post-op Assessment: Report given to RN and Post -op Vital signs reviewed and stable  Post vital signs: Reviewed and stable  Last Vitals:  Vitals Value Taken Time  BP 134/90 11/16/19 1018  Temp    Pulse 93 11/16/19 1019  Resp 21 11/16/19 1019  SpO2 100 % 11/16/19 1019  Vitals shown include unvalidated device data.  Last Pain:  Vitals:   11/16/19 0854  TempSrc: Oral  PainSc: 0-No pain         Complications: No apparent anesthesia complications

## 2019-11-16 NOTE — Interval H&P Note (Signed)
History and Physical Interval Note:  11/16/2019 9:36 AM  Claudia Wiley  has presented today for surgery, with the diagnosis of dysphagia.  The various methods of treatment have been discussed with the patient and family. After consideration of risks, benefits and other options for treatment, the patient has consented to  Procedure(s): ESOPHAGOGASTRODUODENOSCOPY (EGD) WITH PROPOFOL (N/A) as a surgical intervention.  The patient's history has been reviewed, patient examined, no change in status, stable for surgery.  I have reviewed the patient's chart and labs.  Questions were answered to the patient's satisfaction.     Pricilla Riffle. Fuller Plan

## 2019-11-16 NOTE — Anesthesia Procedure Notes (Signed)
Procedure Name: MAC Date/Time: 11/16/2019 9:58 AM Performed by: Eben Burow, CRNA Pre-anesthesia Checklist: Patient identified, Emergency Drugs available, Suction available, Patient being monitored and Timeout performed Oxygen Delivery Method: Simple face mask

## 2019-11-16 NOTE — Op Note (Signed)
[facility] Patient Name: Claudia Wiley Procedure Date: 11/16/2019 MRN: [id number] Attending MD: [name] , MD Date of Birth: Nov 21, 1955 CSN: [id number] Age: 64 Admit Type: Outpatient Procedure:                Upper GI endoscopy Indications:              Therapeutic procedure, Dysphagia, For therapy of                            esophageal stricture Providers:                Pricilla Riffle. Fuller Plan, MD, Elmer Ramp. Tilden Dome, RN, Marguerita Merles, Technician, Jefm Miles CRNA Referring MD:             [facility] Medicines:                Monitored Anesthesia Care Complications:            No immediate complications. Estimated Blood Loss:     Estimated blood loss was minimal. Procedure:                Pre-Anesthesia Assessment:                           - Prior to the procedure, a History and Physical                            was performed, and patient medications and                            allergies were reviewed. The patient's tolerance of                            previous anesthesia was also reviewed. The risks                            and benefits of the procedure and the sedation                            options and risks were discussed with the patient.                            All questions were answered, and informed consent                            was obtained. Prior Anticoagulants: The patient has                            taken no previous anticoagulant or antiplatelet                            agents. ASA Grade Assessment: II - A patient with  mild systemic disease. After reviewing the risks                            and benefits, the patient was deemed in                            satisfactory condition to undergo the procedure.                           After obtaining informed consent, the endoscope was                            passed under direct vision. Throughout the                       procedure, the patient's blood pressure, pulse, and                            oxygen saturations were monitored continuously. The                            GIF-H190 [id number] Olympus gastroscope was                            introduced through the mouth, and advanced to the                            second part of duodenum. The upper GI endoscopy was                            accomplished without difficulty. The patient                            tolerated the procedure well. Scope In: Scope Out: Findings:      Many benign-appearing, intrinsic moderate stenoses were found in the       proximal and mid esophagus. The stenoses were traversed.      One superficial esophageal ulcer with no bleeding and no stigmata of       recent bleeding was found 33 to 36 cm from the incisors. The lesion was       10 mm in largest dimension.      One benign-appearing, intrinsic severe stenosis was found 36 cm from the       incisors. This stenosis measured 8 mm (inner diameter) x less than one       cm (in length). The stenosis was traversed after dilation. A guidewire       was placed and the scope was withdrawn. Dilations were performed with       Savary dilators with mild resistance at 9 mm, 10 mm, 11 mm, 12 mm, 12.8       mm. Small heme on each dilator. Slightly more resistance to last 2       dilators. The dilation site was examined following endoscope reinsertion       and showed moderate mucosal disruption and moderate improvement in       luminal narrowing.      A small  hiatal hernia was present.      The exam of the stomach was otherwise normal.      The duodenal bulb and second portion of the duodenum were normal. Impression:               - Benign-appearing esophageal stenoses.                           - Esophageal ulcer with no bleeding and no stigmata                            of recent bleeding.                           - Benign-appearing esophageal stenosis.  Dilated.                           - Small hiatal hernia.                           - Normal duodenal bulb and second portion of the                            duodenum.                           - No specimens collected. Moderate Sedation:      Not Applicable - Patient had care per Anesthesia. Recommendation:           - Patient has a contact number available for                            emergencies. The signs and symptoms of potential                            delayed complications were discussed with the                            patient. Return to normal activities tomorrow.                            Written discharge instructions were provided to the                            patient.                           - Clear liquid diet for 2 hours, then advance as                            tolerated to full liquid diet today.                           - Continue full liquid diet until next dilation.                           - Continue present  medications.                           - Repeat EGD in 2-3 weeks for repeat dilation.                           - Management plans discussed in detail with patient                            and her daughter. Procedure Code(s):        --- Professional ---                           [phone], Esophagogastroduodenoscopy, flexible,                            transoral; with insertion of guide wire followed by                            passage of dilator(s) through esophagus over guide                            wire Diagnosis Code(s):        --- Professional ---                           K22.2, Esophageal obstruction                           K22.10, Ulcer of esophagus without bleeding                           K44.9, Diaphragmatic hernia without obstruction or                            gangrene                           R13.10, Dysphagia, unspecified CPT copyright 2019 American Medical Association. All rights reserved. The codes documented in  this report are preliminary and upon coder review may  be revised to meet current compliance requirements. [name], MD 11/16/2019 11:00:29 AM This report has been signed electronically. Number of Addenda: 0

## 2019-11-17 ENCOUNTER — Telehealth: Payer: Self-pay

## 2019-11-17 ENCOUNTER — Other Ambulatory Visit: Payer: Self-pay

## 2019-11-17 DIAGNOSIS — K222 Esophageal obstruction: Secondary | ICD-10-CM

## 2019-11-17 DIAGNOSIS — R1310 Dysphagia, unspecified: Secondary | ICD-10-CM

## 2019-11-17 NOTE — Telephone Encounter (Signed)
Dr. Henrene Pastor has agreed to perform procedure on 5/17.  Patient notified and she is scheduled to arrive at [facility] at 8:15 and be NPO after midnight.  She is aware to go for her Covid screen on 11/25/19

## 2019-11-17 NOTE — Telephone Encounter (Signed)
-----   Message from [name], MD sent at 11/16/2019 11:54 AM EDT ----- See today's EGD report. Pt needs repeat EGD/dilation for a severe esophageal stricture. Optimally the next EGD would be at a 2-3 week interval. Please schedule with me for 6/8, 6/9 during my hospital week or on 6/15 my next hospital outpatient block. Is there anyone who can do an EGD/dilation in about 2-3 weeks? If no one I may have to get creative to schedule a time slot.

## 2019-11-17 NOTE — Telephone Encounter (Signed)
Patient has been scheduled for 6/8 12:30 at [facility]. With Dr. Fuller Plan.  She will go for COVID screen on 6/3. She is notified to arrive on 6/8 at 11:00 and be NPO after midnight.  She is notified I will contact her as soon as I can arrange a case for the week of 5/17.

## 2019-11-25 ENCOUNTER — Other Ambulatory Visit (HOSPITAL_COMMUNITY)
Admission: RE | Admit: 2019-11-25 | Discharge: 2019-11-25 | Disposition: A | Discharge status: Home / Self Care | Payer: No Typology Code available for payment source | Source: Ambulatory Visit | Attending: Internal Medicine | Admitting: Internal Medicine

## 2019-11-25 DIAGNOSIS — Z01812 Encounter for preprocedural laboratory examination: Secondary | ICD-10-CM | POA: Insufficient documentation

## 2019-11-25 DIAGNOSIS — Z20822 Contact with and (suspected) exposure to covid-19: Secondary | ICD-10-CM | POA: Diagnosis not present

## 2019-11-25 LAB — SARS CORONAVIRUS 2 (TAT 6-24 HRS): SARS Coronavirus 2: NEGATIVE

## 2019-11-29 ENCOUNTER — Other Ambulatory Visit: Payer: Self-pay

## 2019-11-29 ENCOUNTER — Encounter (HOSPITAL_COMMUNITY): Payer: Self-pay | Admitting: Internal Medicine

## 2019-11-29 ENCOUNTER — Ambulatory Visit (HOSPITAL_COMMUNITY): Payer: No Typology Code available for payment source

## 2019-11-29 ENCOUNTER — Ambulatory Visit (HOSPITAL_COMMUNITY)
Admission: RE | Admit: 2019-11-29 | Discharge: 2019-11-29 | Disposition: A | Discharge status: Home / Self Care | Payer: No Typology Code available for payment source | Attending: Internal Medicine | Admitting: Internal Medicine

## 2019-11-29 ENCOUNTER — Telehealth: Payer: Self-pay | Admitting: Internal Medicine

## 2019-11-29 ENCOUNTER — Ambulatory Visit (HOSPITAL_COMMUNITY): Payer: No Typology Code available for payment source | Admitting: Anesthesiology

## 2019-11-29 ENCOUNTER — Encounter (HOSPITAL_COMMUNITY)
Admission: RE | Disposition: A | Discharge status: Home / Self Care | Payer: Self-pay | Source: Home / Self Care | Attending: Internal Medicine

## 2019-11-29 DIAGNOSIS — K219 Gastro-esophageal reflux disease without esophagitis: Secondary | ICD-10-CM | POA: Diagnosis not present

## 2019-11-29 DIAGNOSIS — E46 Unspecified protein-calorie malnutrition: Secondary | ICD-10-CM | POA: Insufficient documentation

## 2019-11-29 DIAGNOSIS — Z85028 Personal history of other malignant neoplasm of stomach: Secondary | ICD-10-CM | POA: Diagnosis not present

## 2019-11-29 DIAGNOSIS — I10 Essential (primary) hypertension: Secondary | ICD-10-CM | POA: Diagnosis not present

## 2019-11-29 DIAGNOSIS — Z87891 Personal history of nicotine dependence: Secondary | ICD-10-CM | POA: Diagnosis not present

## 2019-11-29 DIAGNOSIS — K222 Esophageal obstruction: Secondary | ICD-10-CM | POA: Diagnosis not present

## 2019-11-29 DIAGNOSIS — R1310 Dysphagia, unspecified: Secondary | ICD-10-CM | POA: Insufficient documentation

## 2019-11-29 DIAGNOSIS — Z681 Body mass index (BMI) 19 or less, adult: Secondary | ICD-10-CM | POA: Insufficient documentation

## 2019-11-29 DIAGNOSIS — K2289 Other specified disease of esophagus: Secondary | ICD-10-CM

## 2019-11-29 DIAGNOSIS — Z888 Allergy status to other drugs, medicaments and biological substances status: Secondary | ICD-10-CM | POA: Insufficient documentation

## 2019-11-29 DIAGNOSIS — Z881 Allergy status to other antibiotic agents status: Secondary | ICD-10-CM | POA: Insufficient documentation

## 2019-11-29 DIAGNOSIS — K449 Diaphragmatic hernia without obstruction or gangrene: Secondary | ICD-10-CM | POA: Diagnosis not present

## 2019-11-29 DIAGNOSIS — M1990 Unspecified osteoarthritis, unspecified site: Secondary | ICD-10-CM | POA: Diagnosis not present

## 2019-11-29 DIAGNOSIS — Z9981 Dependence on supplemental oxygen: Secondary | ICD-10-CM | POA: Insufficient documentation

## 2019-11-29 DIAGNOSIS — J449 Chronic obstructive pulmonary disease, unspecified: Secondary | ICD-10-CM | POA: Diagnosis not present

## 2019-11-29 HISTORY — DX: Other specified postprocedural states: R11.2

## 2019-11-29 HISTORY — DX: Other specified postprocedural states: Z98.890

## 2019-11-29 HISTORY — PX: SAVORY DILATION: SHX5439

## 2019-11-29 HISTORY — PX: ESOPHAGOGASTRODUODENOSCOPY (EGD) WITH PROPOFOL: SHX5813

## 2019-11-29 SURGERY — ESOPHAGOGASTRODUODENOSCOPY (EGD) WITH PROPOFOL
Anesthesia: Monitor Anesthesia Care

## 2019-11-29 MED ORDER — PROPOFOL 500 MG/50ML IV EMUL
INTRAVENOUS | Status: DC | PRN
  Administered 2019-11-29: 125 ug/kg/min via INTRAVENOUS

## 2019-11-29 MED ORDER — ONDANSETRON HCL 4 MG/2ML IJ SOLN
INTRAMUSCULAR | Status: DC | PRN
  Administered 2019-11-29: 4 mg via INTRAVENOUS

## 2019-11-29 MED ORDER — SODIUM CHLORIDE 0.9 % IV SOLN: INTRAVENOUS | Status: DC

## 2019-11-29 MED ORDER — PANTOPRAZOLE SODIUM 40 MG PO TBEC
40.0000 mg | DELAYED_RELEASE_TABLET | Freq: Two times a day (BID) | ORAL | 11 refills | Status: DC
Start: 2019-11-29 — End: 2020-04-19

## 2019-11-29 MED ORDER — PROPOFOL 10 MG/ML IV BOLUS
INTRAVENOUS | Status: DC | PRN
  Administered 2019-11-29: 20 mg via INTRAVENOUS
  Administered 2019-11-29: 10 mg via INTRAVENOUS

## 2019-11-29 MED ORDER — LACTATED RINGERS IV SOLN: INTRAVENOUS | Status: DC | PRN

## 2019-11-29 SURGICAL SUPPLY — 14 items

## 2019-11-29 NOTE — Discharge Instructions (Signed)
YOU HAD AN ENDOSCOPIC PROCEDURE TODAY: Refer to the procedure report and other information in the discharge instructions given to you for any specific questions about what was found during the examination. If this information does not answer your questions, please call [place] office at [phone] to clarify.   YOU SHOULD EXPECT: Some feelings of bloating in the abdomen. Passage of more gas than usual. Walking can help get rid of the air that was put into your GI tract during the procedure and reduce the bloating. If you had a lower endoscopy (such as a colonoscopy or flexible sigmoidoscopy) you may notice spotting of blood in your stool or on the toilet paper. Some abdominal soreness may be present for a day or two, also.  DIET: Your first meal following the procedure should be a light meal and then it is ok to progress to your normal diet. A half-sandwich or bowl of soup is an example of a good first meal. Heavy or fried foods are harder to digest and may make you feel nauseous or bloated. Drink plenty of fluids but you should avoid alcoholic beverages for 24 hours. If you had a esophageal dilation, please see attached instructions for diet.    ACTIVITY: Your care partner should take you home directly after the procedure. You should plan to take it easy, moving slowly for the rest of the day. You can resume normal activity the day after the procedure however YOU SHOULD NOT DRIVE, use power tools, machinery or perform tasks that involve climbing or major physical exertion for 24 hours (because of the sedation medicines used during the test).   SYMPTOMS TO REPORT IMMEDIATELY: A gastroenterologist can be reached at any hour. Please call [phone]  for any of the following symptoms:   Following upper endoscopy (EGD, EUS, ERCP, esophageal dilation) Vomiting of blood or coffee ground material  New, significant abdominal pain  New, significant chest pain or pain under the shoulder blades  Painful or  persistently difficult swallowing  New shortness of breath  Black, tarry-looking or red, bloody stools  FOLLOW UP:  If any biopsies were taken you will be contacted by phone or by letter within the next 1-3 weeks. Call [phone]  if you have not heard about the biopsies in 3 weeks.  Please also call with any specific questions about appointments or follow up tests.  

## 2019-11-29 NOTE — Transfer of Care (Signed)
Immediate Anesthesia Transfer of Care Note  Patient: Claudia Wiley  Procedure(s) Performed: ESOPHAGOGASTRODUODENOSCOPY (EGD) WITH PROPOFOL (N/A ) SAVORY DILATION (N/A )  Patient Location: PACU  Anesthesia Type:MAC  Level of Consciousness: sedated  Airway & Oxygen Therapy: Patient Spontanous Breathing and Patient connected to face mask oxygen  Post-op Assessment: Report given to RN and Post -op Vital signs reviewed and stable  Post vital signs: Reviewed and stable  Last Vitals:  Vitals Value Taken Time  BP    Temp    Pulse    Resp    SpO2      Last Pain:  Vitals:   11/29/19 0817  TempSrc: Oral  PainSc: 0-No pain         Complications: No apparent anesthesia complications

## 2019-11-29 NOTE — H&P (Signed)
HISTORY OF PRESENT ILLNESS:  Claudia Wiley is a 64 y.o. female with multiple significant medical problems who presents today for esophageal dilation of high-grade stricture.  She has had serial dilations over time.  Last such examination Nov 17, 2019 to a maximal diameter of 12.8 mm.  Despite significant improvement in the esophagus over time, she does not feel this is helped her swallowing significantly.  Not for repeat exam  REVIEW OF SYSTEMS:  All non-GI ROS negative except for arthritis, depression, fatigue  Past Medical History:  Diagnosis Date  . Anemia   . Arthritis   . Cancer [facility]) 2013   Upper left lung and stomach cancer  . COPD (chronic obstructive pulmonary disease) ([place])   . Esophageal stricture   . Family history of brain cancer   . Family history of melanoma   . Gastrinoma   . Gastrinoma   . GERD (gastroesophageal reflux disease)   . Hypertension   . Osteoporosis   . Pancreatic insufficiency   . Pneumonia   . PONV (postoperative nausea and vomiting)   . Vitamin D deficiency   . Weight loss, unintentional     Past Surgical History:  Procedure Laterality Date  . ABDOMINAL HYSTERECTOMY    . BIOPSY  09/11/2019   Procedure: BIOPSY;  Surgeon: Lavena Bullion, DO;  Location: [facility];  Service: Gastroenterology;;  . BIOPSY  10/03/2019   Procedure: BIOPSY;  Surgeon: Jerene Bears, MD;  Location: [facility];  Service: Gastroenterology;;  . BREAST LUMPECTOMY    . CESAREAN SECTION    . ESOPHAGOGASTRODUODENOSCOPY (EGD) WITH PROPOFOL N/A 09/11/2019   Procedure: ESOPHAGOGASTRODUODENOSCOPY (EGD) WITH PROPOFOL;  Surgeon: Lavena Bullion, DO;  Location: [facility];  Service: Gastroenterology;  Laterality: N/A;  . ESOPHAGOGASTRODUODENOSCOPY (EGD) WITH PROPOFOL N/A 10/03/2019   Procedure: ESOPHAGOGASTRODUODENOSCOPY (EGD) WITH PROPOFOL;  Surgeon: Jerene Bears, MD;  Location: [facility];  Service: Gastroenterology;  Laterality: N/A;  . ESOPHAGOGASTRODUODENOSCOPY  (EGD) WITH PROPOFOL N/A 10/19/2019   Procedure: ESOPHAGOGASTRODUODENOSCOPY (EGD) WITH PROPOFOL;  Surgeon: Yetta Flock, MD;  Location: [facility];  Service: Gastroenterology;  Laterality: N/A;  possible dilation  . ESOPHAGOGASTRODUODENOSCOPY (EGD) WITH PROPOFOL N/A 11/02/2019   Procedure: ESOPHAGOGASTRODUODENOSCOPY (EGD) WITH FLUORO AND  WITH PROPOFOL;  Surgeon: [name], MD;  Location: [facility];  Service: Endoscopy;  Laterality: N/A;  need fluoro  . ESOPHAGOGASTRODUODENOSCOPY (EGD) WITH PROPOFOL N/A 11/16/2019   Procedure: ESOPHAGOGASTRODUODENOSCOPY (EGD) WITH PROPOFOL;  Surgeon: [name], MD;  Location: [facility];  Service: Endoscopy;  Laterality: N/A;  . LUNG SURGERY    . SAVORY DILATION N/A 10/19/2019   Procedure: SAVORY DILATION;  Surgeon: Yetta Flock, MD;  Location: [facility];  Service: Gastroenterology;  Laterality: N/A;  . SAVORY DILATION N/A 11/02/2019   Procedure: SAVORY DILATION;  Surgeon: [name], MD;  Location: [facility];  Service: Endoscopy;  Laterality: N/A;  fluoro  . SAVORY DILATION N/A 11/16/2019   Procedure: SAVORY DILATION;  Surgeon: [name], MD;  Location: [facility];  Service: Endoscopy;  Laterality: N/A;    Social History [place]  reports that she quit smoking about 6 months ago. Her smoking use included cigarettes. She has a 0.50 pack-year smoking history. She has never used smokeless tobacco. She reports that she does not drink alcohol or use drugs.  family history includes Cancer (age of onset: 25) in an other family member; Cancer (age of onset: 47) in her sister; Diabetes in her sister; Heart attack in her  father; Melanoma in her maternal aunt; Pneumonia in her brother.  Allergies  Allergen Reactions  . Azithromycin Other (See Comments)    Unknown   . Boniva [Ibandronic Acid] Other (See Comments)    unknown  . Gabapentin Other (See Comments)    unknown       PHYSICAL EXAMINATION: Vital signs:  BP (!) 147/93   Pulse (!) 103   Temp 98.5 F (36.9 C) (Oral)   Resp 18   Ht 4\' 11"  (1.499 m)   Wt 29.8 kg   SpO2 100%   BMI 13.29 kg/m   Constitutional: Very thin and chronically ill-appearing, no acute distress Psychiatric: alert and oriented x3, cooperative Eyes: extraocular movements intact, anicteric, conjunctiva pink Mouth: oral pharynx moist, no lesions Neck: supple no lymphadenopathy Cardiovascular: heart regular rate and rhythm, no murmur Lungs: clear to auscultation bilaterally Abdomen: soft, nontender, nondistended, no obvious ascites, no peritoneal signs, normal bowel sounds, no organomegaly Rectal: Omitted Extremities: no clubbing, cyanosis, or lower extremity edema bilaterally Skin: no lesions on visible extremities Neuro: No focal deficits.   ASSESSMENT:  1.  High-grade benign esophageal stricture requiring multiple dilations.  Last such dilation 12 days ago.  Presents for the same.   PLAN:  1.  Fluoroscopy assisted esophageal dilation with guidewire savary system.  Patient is high risk given her esophageal anatomy and overall health state.The nature of the procedure, as well as the risks, benefits, and alternatives were carefully and thoroughly reviewed with the patient. Ample time for discussion and questions allowed. The patient understood, was satisfied, and agreed to proceed.

## 2019-11-29 NOTE — Anesthesia Postprocedure Evaluation (Signed)
Anesthesia Post Note  Patient: Claudia Wiley  Procedure(s) Performed: ESOPHAGOGASTRODUODENOSCOPY (EGD) WITH PROPOFOL (N/A ) SAVORY DILATION (N/A )     Patient location during evaluation: PACU Anesthesia Type: MAC Level of consciousness: awake and alert Pain management: pain level controlled Vital Signs Assessment: post-procedure vital signs reviewed and stable Respiratory status: spontaneous breathing, nonlabored ventilation, respiratory function stable and patient connected to nasal cannula oxygen Cardiovascular status: stable and blood pressure returned to baseline Postop Assessment: no apparent nausea or vomiting Anesthetic complications: no    Last Vitals:  Vitals:   11/29/19 0930 11/29/19 0940  BP: 139/89 (!) 151/84  Pulse: 91 73  Resp: (!) 27 19  Temp:    SpO2: 100% 100%    Last Pain:  Vitals:   11/29/19 0940  TempSrc:   PainSc: 0-No pain                 Tiajuana Amass

## 2019-11-29 NOTE — Telephone Encounter (Signed)
I left a detailed message on this patient's daughters voicemail.  Please see my note regarding findings, recommendations and plans.  You this with her again.  See if there is any specific question that she was concerned about.  She is actually her primary GI patient of Dr. Fuller Plan.  I was helping out.  Thanks [employer]

## 2019-11-29 NOTE — Telephone Encounter (Signed)
Daughter stated she did not get the voicemail as the hospital had the wrong number. Reviewed findings and recommendation with pts daughter and she is aware. Concerned mother may have trouble swallowing the pill, discussed they may use applesauce or yogurt to help her get it down.

## 2019-11-29 NOTE — Anesthesia Preprocedure Evaluation (Signed)
Anesthesia Evaluation  Patient identified by MRN, date of birth, ID band Patient awake    Reviewed: Allergy & Precautions, NPO status , Patient's Chart, lab work & pertinent test results  History of Anesthesia Complications Negative for: history of anesthetic complications  Airway Mallampati: II  TM Distance: >3 FB Neck ROM: Full    Dental  (+) Edentulous Lower, Edentulous Upper   Pulmonary COPD (prn home oxygen),  COPD inhaler and oxygen dependent, former smoker,   Lung cancer s/p resection    Pulmonary exam normal        Cardiovascular hypertension, Normal cardiovascular exam     Neuro/Psych negative neurological ROS  negative psych ROS   GI/Hepatic Neg liver ROS, GERD  Medicated and Controlled, Hx gastrinoma Malnourishment; BMI <15   Endo/Other  negative endocrine ROS  Renal/GU negative Renal ROS     Musculoskeletal  (+) Arthritis ,   Abdominal   Peds  Hematology negative hematology ROS (+)   Anesthesia Other Findings Covid neg 4/30  Reproductive/Obstetrics                             Anesthesia Physical  Anesthesia Plan  ASA: III  Anesthesia Plan: MAC   Post-op Pain Management:    Induction: Intravenous  PONV Risk Score and Plan: 2 and Propofol infusion and Treatment may vary due to age or medical condition  Airway Management Planned: Nasal Cannula and Natural Airway  Additional Equipment: None  Intra-op Plan:   Post-operative Plan:   Informed Consent: I have reviewed the patients History and Physical, chart, labs and discussed the procedure including the risks, benefits and alternatives for the proposed anesthesia with the patient or authorized representative who has indicated his/her understanding and acceptance.       Plan Discussed with: CRNA and Anesthesiologist  Anesthesia Plan Comments:         Anesthesia Quick Evaluation

## 2019-11-29 NOTE — Op Note (Signed)
[facility] Patient Name: Claudia Wiley Procedure Date: 11/29/2019 MRN: [id number] Attending MD: Docia Chuck. Henrene Pastor , MD Date of Birth: 12/24/1955 CSN: [id number] Age: 64 Admit Type: Outpatient Procedure:                Upper GI endoscopy with Savary dilation of the                            esophagus. 14 mm Indications:              Dysphagia, Therapeutic procedure Providers:                Docia Chuck. Henrene Pastor, MD, Cleda Daub, RN, Laverda Sorenson, Technician, [place] CRNA, CRNA Referring MD:              Medicines:                Monitored Anesthesia Care Complications:            No immediate complications. Estimated Blood Loss:     Estimated blood loss: none. Procedure:                Pre-Anesthesia Assessment:                           - Prior to the procedure, a History and Physical                            was performed, and patient medications and                            allergies were reviewed. The patient's tolerance of                            previous anesthesia was also reviewed. The risks                            and benefits of the procedure and the sedation                            options and risks were discussed with the patient.                            All questions were answered, and informed consent                            was obtained. Prior Anticoagulants: The patient has                            taken no previous anticoagulant or antiplatelet                            agents. ASA Grade Assessment: III - A patient with  severe systemic disease. After reviewing the risks                            and benefits, the patient was deemed in                            satisfactory condition to undergo the procedure.                           After obtaining informed consent, the endoscope was                            passed under direct vision. Throughout the        procedure, the patient's blood pressure, pulse, and                            oxygen saturations were monitored continuously. The                            [id number]-H190 [id number] Olympus gastroscope was                            introduced through the mouth, and advanced to the                            second part of duodenum. The upper GI endoscopy was                            accomplished without difficulty. The patient                            tolerated the procedure well. Scope In: Scope Out: Findings:      Multiple benign-appearing, intrinsic moderate stenoses were found       throughout the esophagus with the most high-grade being at the       gastroesophageal junction. This measured approximately 9 mm and allow       the scope to pass through with mild resistance. There was significant       active ulceration in this region. After completing the endoscopic       survey, A guidewire was placed under fluoroscopic guidance and the scope       was withdrawn. Dilation was performed with a Savary dilator with mild       resistance at 14 mm. The dilation site was examined and showed moderate       mucosal disruption and moderate improvement in luminal narrowing.      The stomach was normal, save small hiatal hernia.      The examined duodenum was normal.      The cardia and gastric fundus were normal on retroflexion. Impression:               - Benign-appearing high-grade esophageal stenoses.                            Dilated.                           -  Normal stomach.                           - Normal examined duodenum.                           - No specimens collected. Moderate Sedation:      none Recommendation:           1. Post dilation diet                           2. PRESCRIBE PANTOPRAZOLE 40 mg TWICE DAILY; #60;                            11 refills. Please take this twice daily every day.                            This will help heal the esophageal  inflammation and                            allow for more durable response to dilation.                           3. Follow-up repeat upper endoscopy with esophageal                            dilation with Dr. Fuller Plan as scheduled.                           Message left with Cherice [phone] Procedure Code(s):        --- Professional ---                           [phone], Esophagogastroduodenoscopy, flexible,                            transoral; with insertion of guide wire followed by                            passage of dilator(s) through esophagus over guide                            wire Diagnosis Code(s):        --- Professional ---                           K22.2, Esophageal obstruction                           R13.10, Dysphagia, unspecified CPT copyright 2019 American Medical Association. All rights reserved. The codes documented in this report are preliminary and upon coder review may  be revised to meet current compliance requirements. Docia Chuck. Henrene Pastor, MD 11/29/2019 9:27:16 AM This report has been signed electronically. Number of Addenda: 0

## 2019-11-30 ENCOUNTER — Encounter: Payer: Self-pay | Admitting: *Deleted

## 2019-11-30 ENCOUNTER — Telehealth: Payer: Self-pay

## 2019-11-30 NOTE — Telephone Encounter (Signed)
-----   Message from [name], MD sent at 11/29/2019  9:52 AM EDT ----- Regarding: Prescribe pantoprazole please [place],Dr. Fuller Plan patient that I dilated this morning.  I would like to change her medication.Please prescribe pantoprazole 40 mg twice daily; #60; 11 refills.I believe she already has a repeat dilation scheduled with Dr. Fuller Plan on June 8.  I also send him a note to keep him up-to-date.Thank you,Dr. Henrene Pastor

## 2019-12-16 ENCOUNTER — Other Ambulatory Visit (HOSPITAL_COMMUNITY)
Admission: RE | Admit: 2019-12-16 | Discharge: 2019-12-16 | Disposition: A | Discharge status: Home / Self Care | Payer: No Typology Code available for payment source | Source: Ambulatory Visit | Attending: Gastroenterology | Admitting: Gastroenterology

## 2019-12-16 DIAGNOSIS — Z01812 Encounter for preprocedural laboratory examination: Secondary | ICD-10-CM | POA: Diagnosis present

## 2019-12-16 DIAGNOSIS — Z20822 Contact with and (suspected) exposure to covid-19: Secondary | ICD-10-CM | POA: Insufficient documentation

## 2019-12-16 LAB — SARS CORONAVIRUS 2 (TAT 6-24 HRS): SARS Coronavirus 2: NEGATIVE

## 2019-12-21 ENCOUNTER — Other Ambulatory Visit: Payer: Self-pay

## 2019-12-21 ENCOUNTER — Encounter (HOSPITAL_COMMUNITY): Payer: Self-pay | Admitting: Gastroenterology

## 2019-12-21 ENCOUNTER — Ambulatory Visit (HOSPITAL_COMMUNITY): Payer: No Typology Code available for payment source | Admitting: Certified Registered Nurse Anesthetist

## 2019-12-21 ENCOUNTER — Encounter (HOSPITAL_COMMUNITY)
Admission: RE | Disposition: A | Discharge status: Home / Self Care | Payer: Self-pay | Source: Home / Self Care | Attending: Gastroenterology

## 2019-12-21 ENCOUNTER — Ambulatory Visit (HOSPITAL_COMMUNITY)
Admission: RE | Admit: 2019-12-21 | Discharge: 2019-12-21 | Disposition: A | Discharge status: Home / Self Care | Payer: No Typology Code available for payment source | Attending: Gastroenterology | Admitting: Gastroenterology

## 2019-12-21 DIAGNOSIS — Z85028 Personal history of other malignant neoplasm of stomach: Secondary | ICD-10-CM | POA: Insufficient documentation

## 2019-12-21 DIAGNOSIS — K222 Esophageal obstruction: Secondary | ICD-10-CM

## 2019-12-21 DIAGNOSIS — K219 Gastro-esophageal reflux disease without esophagitis: Secondary | ICD-10-CM | POA: Insufficient documentation

## 2019-12-21 DIAGNOSIS — Z888 Allergy status to other drugs, medicaments and biological substances status: Secondary | ICD-10-CM | POA: Insufficient documentation

## 2019-12-21 DIAGNOSIS — Z87891 Personal history of nicotine dependence: Secondary | ICD-10-CM | POA: Diagnosis not present

## 2019-12-21 DIAGNOSIS — R1310 Dysphagia, unspecified: Secondary | ICD-10-CM

## 2019-12-21 DIAGNOSIS — D649 Anemia, unspecified: Secondary | ICD-10-CM | POA: Diagnosis not present

## 2019-12-21 DIAGNOSIS — E559 Vitamin D deficiency, unspecified: Secondary | ICD-10-CM | POA: Insufficient documentation

## 2019-12-21 DIAGNOSIS — Z8249 Family history of ischemic heart disease and other diseases of the circulatory system: Secondary | ICD-10-CM | POA: Insufficient documentation

## 2019-12-21 DIAGNOSIS — Z9981 Dependence on supplemental oxygen: Secondary | ICD-10-CM | POA: Insufficient documentation

## 2019-12-21 DIAGNOSIS — Z808 Family history of malignant neoplasm of other organs or systems: Secondary | ICD-10-CM | POA: Diagnosis not present

## 2019-12-21 DIAGNOSIS — Z85118 Personal history of other malignant neoplasm of bronchus and lung: Secondary | ICD-10-CM | POA: Insufficient documentation

## 2019-12-21 DIAGNOSIS — J449 Chronic obstructive pulmonary disease, unspecified: Secondary | ICD-10-CM | POA: Insufficient documentation

## 2019-12-21 DIAGNOSIS — K449 Diaphragmatic hernia without obstruction or gangrene: Secondary | ICD-10-CM | POA: Insufficient documentation

## 2019-12-21 DIAGNOSIS — M1990 Unspecified osteoarthritis, unspecified site: Secondary | ICD-10-CM | POA: Diagnosis not present

## 2019-12-21 DIAGNOSIS — Z90710 Acquired absence of both cervix and uterus: Secondary | ICD-10-CM | POA: Insufficient documentation

## 2019-12-21 DIAGNOSIS — Z833 Family history of diabetes mellitus: Secondary | ICD-10-CM | POA: Insufficient documentation

## 2019-12-21 DIAGNOSIS — I10 Essential (primary) hypertension: Secondary | ICD-10-CM | POA: Insufficient documentation

## 2019-12-21 DIAGNOSIS — M810 Age-related osteoporosis without current pathological fracture: Secondary | ICD-10-CM | POA: Diagnosis not present

## 2019-12-21 DIAGNOSIS — Z881 Allergy status to other antibiotic agents status: Secondary | ICD-10-CM | POA: Insufficient documentation

## 2019-12-21 HISTORY — PX: SAVORY DILATION: SHX5439

## 2019-12-21 HISTORY — PX: ESOPHAGOGASTRODUODENOSCOPY (EGD) WITH PROPOFOL: SHX5813

## 2019-12-21 SURGERY — ESOPHAGOGASTRODUODENOSCOPY (EGD) WITH PROPOFOL
Anesthesia: Monitor Anesthesia Care

## 2019-12-21 MED ORDER — PROPOFOL 500 MG/50ML IV EMUL: INTRAVENOUS | Status: DC | PRN

## 2019-12-21 MED ORDER — BUDESONIDE-FORMOTEROL FUMARATE 160-4.5 MCG/ACT IN AERO: 1.0000 | INHALATION_SPRAY | Freq: Every day | RESPIRATORY_TRACT | 12 refills | Status: AC

## 2019-12-21 MED ORDER — SODIUM CHLORIDE 0.9 % IV SOLN: INTRAVENOUS | Status: DC

## 2019-12-21 MED ORDER — LACTATED RINGERS IV SOLN
INTRAVENOUS | Status: AC | PRN
  Administered 2019-12-21: 1000 mL via INTRAVENOUS

## 2019-12-21 MED ORDER — PROPOFOL 10 MG/ML IV BOLUS
INTRAVENOUS | Status: DC | PRN
  Administered 2019-12-21: 150 ug/kg/min via INTRAVENOUS

## 2019-12-21 MED ORDER — PROPOFOL 1000 MG/100ML IV EMUL
INTRAVENOUS | Status: AC
  Filled 2019-12-21: qty 100

## 2019-12-21 MED ORDER — ONDANSETRON HCL 4 MG/2ML IJ SOLN
INTRAMUSCULAR | Status: DC | PRN
  Administered 2019-12-21: 4 mg via INTRAVENOUS

## 2019-12-21 MED ORDER — PROPOFOL 500 MG/50ML IV EMUL
INTRAVENOUS | Status: DC | PRN
  Administered 2019-12-21: 30 mg via INTRAVENOUS

## 2019-12-21 SURGICAL SUPPLY — 14 items

## 2019-12-21 NOTE — Anesthesia Postprocedure Evaluation (Signed)
Anesthesia Post Note  Patient: Claudia Wiley  Procedure(s) Performed: ESOPHAGOGASTRODUODENOSCOPY (EGD) WITH PROPOFOL (N/A ) SAVORY DILATION (N/A )     Patient location during evaluation: PACU Anesthesia Type: MAC Level of consciousness: awake and alert Pain management: pain level controlled Vital Signs Assessment: post-procedure vital signs reviewed and stable Respiratory status: spontaneous breathing Cardiovascular status: stable Anesthetic complications: no    Last Vitals:  Vitals:   12/21/19 1320 12/21/19 1330  BP: 137/82 (!) 145/76  Pulse: 87 77  Resp: 16 17  Temp:    SpO2: 100% 100%    Last Pain:  Vitals:   12/21/19 1330  TempSrc:   PainSc: 0-No pain                 Nolon Nations

## 2019-12-21 NOTE — H&P (Signed)
HPI: Claudia Wiley is a 64 y.o. female with multiple significant medical problems who presents today for esophageal dilation of high-grade stricture.  She has had serial dilations over time.  Last such examination Nov 29, 2019 to a maximal diameter of 14 mm.  Despite significant improvement in the esophagus over time, she does not feel this is helped her swallowing significantly.  Now for repeat exam  REVIEW OF SYSTEMS:  All non-GI ROS negative except for arthritis, depression, fatigue      Past Medical History:  Diagnosis Date  . Anemia   . Arthritis   . Cancer [facility]) 2013   Upper left lung and stomach cancer  . COPD (chronic obstructive pulmonary disease) ([place])   . Esophageal stricture   . Family history of brain cancer   . Family history of melanoma   . Gastrinoma   . Gastrinoma   . GERD (gastroesophageal reflux disease)   . Hypertension   . Osteoporosis   . Pancreatic insufficiency   . Pneumonia   . PONV (postoperative nausea and vomiting)   . Vitamin D deficiency   . Weight loss, unintentional          Past Surgical History:  Procedure Laterality Date  . ABDOMINAL HYSTERECTOMY    . BIOPSY  09/11/2019   Procedure: BIOPSY;  Surgeon: Lavena Bullion, DO;  Location: [facility];  Service: Gastroenterology;;  . BIOPSY  10/03/2019   Procedure: BIOPSY;  Surgeon: Jerene Bears, MD;  Location: [facility];  Service: Gastroenterology;;  . BREAST LUMPECTOMY    . CESAREAN SECTION    . ESOPHAGOGASTRODUODENOSCOPY (EGD) WITH PROPOFOL N/A 09/11/2019   Procedure: ESOPHAGOGASTRODUODENOSCOPY (EGD) WITH PROPOFOL;  Surgeon: Lavena Bullion, DO;  Location: [facility];  Service: Gastroenterology;  Laterality: N/A;  . ESOPHAGOGASTRODUODENOSCOPY (EGD) WITH PROPOFOL N/A 10/03/2019   Procedure: ESOPHAGOGASTRODUODENOSCOPY (EGD) WITH PROPOFOL;  Surgeon: Jerene Bears, MD;  Location: [facility];  Service: Gastroenterology;  Laterality: N/A;  .  ESOPHAGOGASTRODUODENOSCOPY (EGD) WITH PROPOFOL N/A 10/19/2019   Procedure: ESOPHAGOGASTRODUODENOSCOPY (EGD) WITH PROPOFOL;  Surgeon: Yetta Flock, MD;  Location: [facility];  Service: Gastroenterology;  Laterality: N/A;  possible dilation  . ESOPHAGOGASTRODUODENOSCOPY (EGD) WITH PROPOFOL N/A 11/02/2019   Procedure: ESOPHAGOGASTRODUODENOSCOPY (EGD) WITH FLUORO AND  WITH PROPOFOL;  Surgeon: [name], MD;  Location: [facility];  Service: Endoscopy;  Laterality: N/A;  need fluoro  . ESOPHAGOGASTRODUODENOSCOPY (EGD) WITH PROPOFOL N/A 11/16/2019   Procedure: ESOPHAGOGASTRODUODENOSCOPY (EGD) WITH PROPOFOL;  Surgeon: [name], MD;  Location: [facility];  Service: Endoscopy;  Laterality: N/A;  . LUNG SURGERY    . SAVORY DILATION N/A 10/19/2019   Procedure: SAVORY DILATION;  Surgeon: Yetta Flock, MD;  Location: [facility];  Service: Gastroenterology;  Laterality: N/A;  . SAVORY DILATION N/A 11/02/2019   Procedure: SAVORY DILATION;  Surgeon: [name], MD;  Location: [facility];  Service: Endoscopy;  Laterality: N/A;  fluoro  . SAVORY DILATION N/A 11/16/2019   Procedure: SAVORY DILATION;  Surgeon: [name], MD;  Location: [facility];  Service: Endoscopy;  Laterality: N/A;    Social History [place]  reports that she quit smoking about 6 months ago. Her smoking use included cigarettes. She has a 0.50 pack-year smoking history. She has never used smokeless tobacco. She reports that she does not drink alcohol or use drugs.  family history includes Cancer (age of onset: 53) in an other family member; Cancer (age of onset: 31) in her sister; Diabetes in her  sister; Heart attack in her father; Melanoma in her maternal aunt; Pneumonia in her brother.       Allergies  Allergen Reactions  . Azithromycin Other (See Comments)    Unknown   . Boniva [Ibandronic Acid] Other (See Comments)    unknown  . Gabapentin Other (See Comments)     unknown      PHYSICAL EXAMINATION: Vital signs: BP (!) 147/93   Pulse (!) 103   Temp 98.5 F (36.9 C) (Oral)   Resp 18   Ht 4\' 11"  (1.499 m)   Wt 29.8 kg   SpO2 100%   BMI 13.29 kg/m   Constitutional: Very thin and chronically ill-appearing, no acute distress Psychiatric: alert and oriented x3, cooperative Eyes: extraocular movements intact, anicteric, conjunctiva pink Mouth: oral pharynx moist, no lesions Neck: supple no lymphadenopathy Cardiovascular: heart regular rate and rhythm, no murmur Lungs: clear to auscultation bilaterally Abdomen: soft, nontender, nondistended, no obvious ascites, no peritoneal signs, normal bowel sounds, no organomegaly Rectal: Omitted Extremities: no clubbing, cyanosis, or lower extremity edema bilaterally Skin: no lesions on visible extremities Neuro: No focal deficits.   ASSESSMENT:  1.  High-grade benign esophageal stricture requiring multiple dilations. Little improvement in symptoms despite endoscopic improvement note.     PLAN:  1.  EGD with esophageal dilation with guidewire savary system.  Patient is higher risk given her esophageal anatomy and overall health state.The nature of the procedure, as well as the risks, benefits, and alternatives were carefully and thoroughly reviewed with the patient. Ample time for discussion and questions allowed. The patient understood, was satisfied, and agreed to proceed.

## 2019-12-21 NOTE — Op Note (Signed)
[facility] Patient Name: Claudia Wiley Procedure Date: 12/21/2019 MRN: [id number] Attending MD: [name] , MD Date of Birth: [birth year] CSN: [id number] Age: 64 Admit Type: Outpatient Procedure:                Upper GI endoscopy Indications:              Therapeutic procedure, For therapy of esophageal                            stricture, Dysphagia Providers:                Pricilla Riffle. Fuller Plan, MD, Cleda Daub, RN, Theodora Blow, Technician Referring MD:             [facility] Medicines:                Monitored Anesthesia Care Complications:            No immediate complications. Estimated Blood Loss:     Estimated blood loss was minimal. Procedure:                Pre-Anesthesia Assessment:                           - Prior to the procedure, a History and Physical                            was performed, and patient medications and                            allergies were reviewed. The patient's tolerance of                            previous anesthesia was also reviewed. The risks                            and benefits of the procedure and the sedation                            options and risks were discussed with the patient.                            All questions were answered, and informed consent                            was obtained. Prior Anticoagulants: The patient has                            taken no previous anticoagulant or antiplatelet                            agents. ASA Grade Assessment: III - A patient with  severe systemic disease. After reviewing the risks                            and benefits, the patient was deemed in                            satisfactory condition to undergo the procedure.                           After obtaining informed consent, the endoscope was                            passed under direct vision. Throughout the   procedure, the patient's blood pressure, pulse, and                            oxygen saturations were monitored continuously. The                            GIF-H190 ([id number] Olympus gastroscope was                            introduced through the mouth, and advanced to the                            second part of duodenum. The upper GI endoscopy was                            accomplished without difficulty. The patient                            tolerated the procedure well. Scope In: Scope Out: Findings:      Multiple benign-appearing, intrinsic moderate stenoses were found in the       mid and distal esophagus. The narrowest stenosis measured 1.3 cm (inner       diameter) x less than one cm (in length). The stenoses were traversed.      One benign-appearing, intrinsic severe stenosis was found at the       gastroesophageal junction. This stenosis measured 9 mm (inner diameter)       x less than one cm (in length). The stenosis was traversed. A guidewire       was placed and the scope was withdrawn. Dilations were performed with       Savary dilators with mild resistance at 12 mm, 12.8 mm, 14 mm and 15 mm.       Mild heme on each dilator.      The exam of the esophagus was otherwise normal.      A small hiatal hernia was present.      The exam of the stomach was otherwise normal.      The duodenal bulb and second portion of the duodenum were normal. Impression:               - Benign-appearing esophageal stenoses.                           - Benign-appearing esophageal  stenosis. Dilated.                           - Small hiatal hernia.                           - Normal duodenal bulb and second portion of the                            duodenum.                           - No specimens collected. Moderate Sedation:      Not Applicable - Patient had care per Anesthesia. Recommendation:           - Patient has a contact number available for                            emergencies.  The signs and symptoms of potential                            delayed complications were discussed with the                            patient. Return to normal activities tomorrow.                            Written discharge instructions were provided to the                            patient.                           - Clear liquid diet for 2 hours, then advance as                            tolerated to soft diet indefinitely. Do not advance                            to solid foods until cleared by GI.                           - Continue present medications including                            pantoprazole 40 mg po bid.                           - Repeat upper endoscopy with dilation in about 1                            month for retreatment. Procedure Code(s):        --- Professional ---                           [phone], Esophagogastroduodenoscopy, flexible,  transoral; with insertion of guide wire followed by                            passage of dilator(s) through esophagus over guide                            wire Diagnosis Code(s):        --- Professional ---                           K22.2, Esophageal obstruction                           K44.9, Diaphragmatic hernia without obstruction or                            gangrene                           R13.10, Dysphagia, unspecified CPT copyright 2019 [employer]. All rights reserved. The codes documented in this report are preliminary and upon coder review may  be revised to meet current compliance requirements. [name], MD 12/21/2019 1:02:44 PM This report has been signed electronically. Number of Addenda: 0

## 2019-12-21 NOTE — Transfer of Care (Signed)
Immediate Anesthesia Transfer of Care Note  Patient: Claudia Wiley  Procedure(s) Performed: ESOPHAGOGASTRODUODENOSCOPY (EGD) WITH PROPOFOL (N/A ) SAVORY DILATION (N/A )  Patient Location: PACU  Anesthesia Type:MAC  Level of Consciousness: drowsy, patient cooperative and responds to stimulation  Airway & Oxygen Therapy: Patient Spontanous Breathing and Patient connected to face mask oxygen  Post-op Assessment: Report given to RN and Post -op Vital signs reviewed and stable  Post vital signs: Reviewed and stable  Last Vitals:  Vitals Value Taken Time  BP    Temp    Pulse    Resp    SpO2      Last Pain:  Vitals:   12/21/19 1153  TempSrc: Oral  PainSc: 0-No pain         Complications: No apparent anesthesia complications

## 2019-12-21 NOTE — Anesthesia Preprocedure Evaluation (Addendum)
Anesthesia Evaluation  Patient identified by MRN, date of birth, ID band Patient awake    Reviewed: Allergy & Precautions, NPO status , Patient's Chart, lab work & pertinent test results  History of Anesthesia Complications (+) PONVNegative for: history of anesthetic complications  Airway Mallampati: II  TM Distance: >3 FB Neck ROM: Full    Dental  (+) Edentulous Lower, Edentulous Upper   Pulmonary pneumonia, COPD (prn home oxygen),  COPD inhaler and oxygen dependent, Patient abstained from smoking., former smoker,   Lung cancer s/p resection    Pulmonary exam normal        Cardiovascular hypertension, Normal cardiovascular exam Rhythm:Regular     Neuro/Psych negative neurological ROS  negative psych ROS   GI/Hepatic Neg liver ROS, GERD  Medicated and Controlled, Hx gastrinoma Malnourishment; BMI <15   Endo/Other  negative endocrine ROS  Renal/GU negative Renal ROS     Musculoskeletal  (+) Arthritis ,   Abdominal   Peds  Hematology  (+) Blood dyscrasia, anemia ,   Anesthesia Other Findings Covid neg 4/30  Reproductive/Obstetrics                            Anesthesia Physical  Anesthesia Plan  ASA: III  Anesthesia Plan: MAC   Post-op Pain Management:    Induction: Intravenous  PONV Risk Score and Plan: 2 and Propofol infusion and Treatment may vary due to age or medical condition  Airway Management Planned: Nasal Cannula and Natural Airway  Additional Equipment: None  Intra-op Plan:   Post-operative Plan:   Informed Consent: I have reviewed the patients History and Physical, chart, labs and discussed the procedure including the risks, benefits and alternatives for the proposed anesthesia with the patient or authorized representative who has indicated his/her understanding and acceptance.       Plan Discussed with: CRNA  Anesthesia Plan Comments:          Anesthesia Quick Evaluation

## 2019-12-21 NOTE — Discharge Instructions (Signed)

## 2019-12-22 ENCOUNTER — Encounter: Payer: Self-pay | Admitting: *Deleted

## 2020-01-13 ENCOUNTER — Telehealth: Payer: Self-pay | Admitting: Gastroenterology

## 2020-01-13 NOTE — Telephone Encounter (Signed)
Dr. Fuller Plan can we do her next procedure in the [place]?

## 2020-01-13 NOTE — Telephone Encounter (Signed)
Pt's daughter Linna Caprice called inquiring when pt will need another egd. Pls call her or pt.

## 2020-01-13 NOTE — Telephone Encounter (Signed)
See 6/8 EGD report regarding repeat EGD/dilation in 1 month. OK for EGD/dilation in [place].

## 2020-01-14 NOTE — Telephone Encounter (Signed)
Patient has been scheduled with her daughter for 7/14.  She will come for pre-visit and COVID screens as well.  See appt desk for details.

## 2020-01-18 ENCOUNTER — Telehealth: Payer: Self-pay | Admitting: *Deleted

## 2020-01-18 NOTE — Telephone Encounter (Signed)
While prepping this patient's chart for PV, I saw that she wears O2 at home.  I phoned pt to ask if she is currently using oxygen at home and spoke with her daughter.  She states that patient uses O2 as needed during exertion.  Please advise if patient is ok to have procedure at [facility].   Thanks, Lanelle Bal

## 2020-01-19 NOTE — Telephone Encounter (Signed)
Dr. Fuller Plan,    Per Jenny Reichmann, this pt will need to be at the hospital d/t wearing oxygen.  Please advise,  Cyril Mourning

## 2020-01-19 NOTE — Telephone Encounter (Signed)
Lanelle Bal,  Thank a lot for this referral.  This pt will need to have her procedure done in the hospital.  Thanks,  Osvaldo Angst

## 2020-01-19 NOTE — Telephone Encounter (Signed)
Patient has been reschedule to [facility] on 02/07/20.  She is advised that she will go for her COVID screen on 02/03/20 at [facility] .  She is asked to arrive at [facility] 02/07/20 at 11:15 and be NPO after midnight.

## 2020-01-24 ENCOUNTER — Telehealth: Payer: Self-pay

## 2020-01-24 NOTE — Telephone Encounter (Signed)
Patient contacted and offered an earlier appt for EGD on 7/26.  She agreed.  She verbalized understanding to arrive at 6:30 to register for 8:00 case

## 2020-01-26 ENCOUNTER — Encounter: Payer: No Typology Code available for payment source | Admitting: Gastroenterology

## 2020-02-03 ENCOUNTER — Other Ambulatory Visit (HOSPITAL_COMMUNITY)
Admission: RE | Admit: 2020-02-03 | Discharge: 2020-02-03 | Disposition: A | Discharge status: Home / Self Care | Payer: No Typology Code available for payment source | Source: Ambulatory Visit | Attending: Gastroenterology | Admitting: Gastroenterology

## 2020-02-03 DIAGNOSIS — Z01812 Encounter for preprocedural laboratory examination: Secondary | ICD-10-CM | POA: Diagnosis present

## 2020-02-03 DIAGNOSIS — Z20822 Contact with and (suspected) exposure to covid-19: Secondary | ICD-10-CM | POA: Insufficient documentation

## 2020-02-03 LAB — SARS CORONAVIRUS 2 (TAT 6-24 HRS): SARS Coronavirus 2: NEGATIVE

## 2020-02-06 ENCOUNTER — Encounter (HOSPITAL_COMMUNITY): Payer: Self-pay | Admitting: Gastroenterology

## 2020-02-07 ENCOUNTER — Ambulatory Visit (HOSPITAL_COMMUNITY): Payer: No Typology Code available for payment source | Admitting: Anesthesiology

## 2020-02-07 ENCOUNTER — Other Ambulatory Visit: Payer: Self-pay

## 2020-02-07 ENCOUNTER — Ambulatory Visit (HOSPITAL_COMMUNITY)
Admission: RE | Admit: 2020-02-07 | Discharge: 2020-02-07 | Disposition: A | Discharge status: Home / Self Care | Payer: No Typology Code available for payment source | Attending: Gastroenterology | Admitting: Gastroenterology

## 2020-02-07 ENCOUNTER — Encounter (HOSPITAL_COMMUNITY): Payer: Self-pay | Admitting: Gastroenterology

## 2020-02-07 ENCOUNTER — Encounter (HOSPITAL_COMMUNITY)
Admission: RE | Disposition: A | Discharge status: Home / Self Care | Payer: Self-pay | Source: Home / Self Care | Attending: Gastroenterology

## 2020-02-07 DIAGNOSIS — J449 Chronic obstructive pulmonary disease, unspecified: Secondary | ICD-10-CM | POA: Insufficient documentation

## 2020-02-07 DIAGNOSIS — K222 Esophageal obstruction: Secondary | ICD-10-CM | POA: Diagnosis not present

## 2020-02-07 DIAGNOSIS — Z888 Allergy status to other drugs, medicaments and biological substances status: Secondary | ICD-10-CM | POA: Insufficient documentation

## 2020-02-07 DIAGNOSIS — Z79899 Other long term (current) drug therapy: Secondary | ICD-10-CM | POA: Diagnosis not present

## 2020-02-07 DIAGNOSIS — Z8249 Family history of ischemic heart disease and other diseases of the circulatory system: Secondary | ICD-10-CM | POA: Insufficient documentation

## 2020-02-07 DIAGNOSIS — Z881 Allergy status to other antibiotic agents status: Secondary | ICD-10-CM | POA: Insufficient documentation

## 2020-02-07 DIAGNOSIS — R1310 Dysphagia, unspecified: Secondary | ICD-10-CM

## 2020-02-07 DIAGNOSIS — Z7951 Long term (current) use of inhaled steroids: Secondary | ICD-10-CM | POA: Insufficient documentation

## 2020-02-07 DIAGNOSIS — Z833 Family history of diabetes mellitus: Secondary | ICD-10-CM | POA: Diagnosis not present

## 2020-02-07 DIAGNOSIS — K449 Diaphragmatic hernia without obstruction or gangrene: Secondary | ICD-10-CM | POA: Diagnosis not present

## 2020-02-07 DIAGNOSIS — Z87891 Personal history of nicotine dependence: Secondary | ICD-10-CM | POA: Insufficient documentation

## 2020-02-07 DIAGNOSIS — K219 Gastro-esophageal reflux disease without esophagitis: Secondary | ICD-10-CM | POA: Diagnosis not present

## 2020-02-07 DIAGNOSIS — I10 Essential (primary) hypertension: Secondary | ICD-10-CM | POA: Diagnosis not present

## 2020-02-07 DIAGNOSIS — M1990 Unspecified osteoarthritis, unspecified site: Secondary | ICD-10-CM | POA: Diagnosis not present

## 2020-02-07 HISTORY — PX: ESOPHAGOGASTRODUODENOSCOPY (EGD) WITH PROPOFOL: SHX5813

## 2020-02-07 HISTORY — PX: SAVORY DILATION: SHX5439

## 2020-02-07 SURGERY — ESOPHAGOGASTRODUODENOSCOPY (EGD) WITH PROPOFOL
Anesthesia: Monitor Anesthesia Care

## 2020-02-07 MED ORDER — ONDANSETRON HCL 4 MG/2ML IJ SOLN
INTRAMUSCULAR | Status: DC | PRN
  Administered 2020-02-07: 4 mg via INTRAVENOUS

## 2020-02-07 MED ORDER — PROPOFOL 500 MG/50ML IV EMUL
INTRAVENOUS | Status: DC | PRN
  Administered 2020-02-07: 150 ug/kg/min via INTRAVENOUS

## 2020-02-07 MED ORDER — LACTATED RINGERS IV SOLN: INTRAVENOUS | Status: DC

## 2020-02-07 MED ORDER — PROPOFOL 500 MG/50ML IV EMUL
INTRAVENOUS | Status: DC | PRN
  Administered 2020-02-07: 30 mg via INTRAVENOUS
  Administered 2020-02-07: 10 mg via INTRAVENOUS

## 2020-02-07 SURGICAL SUPPLY — 14 items

## 2020-02-07 NOTE — H&P (Signed)
ADMISSION NOTE  Primary Care Physician:  [facility] Primary Gastroenterologist:   Lucio Edward MD  CHIEF COMPLAINT:  Dysphagia, esophageal strictures  HPI: Claudia Wiley is a 64 y.o. female returning for dysphagia which has significantly improved after several dilations. Mild dysphagia persists. Pt relates that she has gained 2 pounds per recent PCP visit.    Past Medical History:  Diagnosis Date  . Anemia   . Arthritis   . Cancer [facility]) 2013   Upper left lung and stomach cancer  . COPD (chronic obstructive pulmonary disease) ([place])   . Esophageal stricture   . Family history of brain cancer   . Family history of melanoma   . Gastrinoma   . Gastrinoma   . GERD (gastroesophageal reflux disease)   . Hypertension   . Osteoporosis   . Pancreatic insufficiency   . Pneumonia   . PONV (postoperative nausea and vomiting)   . Vitamin D deficiency   . Weight loss, unintentional     Past Surgical History:  Procedure Laterality Date  . ABDOMINAL HYSTERECTOMY    . BIOPSY  09/11/2019   Procedure: BIOPSY;  Surgeon: Lavena Bullion, DO;  Location: [facility];  Service: Gastroenterology;;  . BIOPSY  10/03/2019   Procedure: BIOPSY;  Surgeon: Jerene Bears, MD;  Location: [facility];  Service: Gastroenterology;;  . BREAST LUMPECTOMY    . CESAREAN SECTION    . ESOPHAGOGASTRODUODENOSCOPY (EGD) WITH PROPOFOL N/A 09/11/2019   Procedure: ESOPHAGOGASTRODUODENOSCOPY (EGD) WITH PROPOFOL;  Surgeon: Lavena Bullion, DO;  Location: [facility];  Service: Gastroenterology;  Laterality: N/A;  . ESOPHAGOGASTRODUODENOSCOPY (EGD) WITH PROPOFOL N/A 10/03/2019   Procedure: ESOPHAGOGASTRODUODENOSCOPY (EGD) WITH PROPOFOL;  Surgeon: Jerene Bears, MD;  Location: [facility];  Service: Gastroenterology;  Laterality: N/A;  . ESOPHAGOGASTRODUODENOSCOPY (EGD) WITH PROPOFOL N/A 10/19/2019   Procedure: ESOPHAGOGASTRODUODENOSCOPY (EGD) WITH PROPOFOL;  Surgeon: Yetta Flock, MD;   Location: [facility];  Service: Gastroenterology;  Laterality: N/A;  possible dilation  . ESOPHAGOGASTRODUODENOSCOPY (EGD) WITH PROPOFOL N/A 11/02/2019   Procedure: ESOPHAGOGASTRODUODENOSCOPY (EGD) WITH FLUORO AND  WITH PROPOFOL;  Surgeon: [name], MD;  Location: [facility];  Service: Endoscopy;  Laterality: N/A;  need fluoro  . ESOPHAGOGASTRODUODENOSCOPY (EGD) WITH PROPOFOL N/A 11/16/2019   Procedure: ESOPHAGOGASTRODUODENOSCOPY (EGD) WITH PROPOFOL;  Surgeon: [name], MD;  Location: [facility];  Service: Endoscopy;  Laterality: N/A;  . ESOPHAGOGASTRODUODENOSCOPY (EGD) WITH PROPOFOL N/A 11/29/2019   Procedure: ESOPHAGOGASTRODUODENOSCOPY (EGD) WITH PROPOFOL;  Surgeon: [name], MD;  [facility];  Service: Endoscopy;  Laterality: N/A;  need fluoro  . ESOPHAGOGASTRODUODENOSCOPY (EGD) WITH PROPOFOL N/A 12/21/2019   Procedure: ESOPHAGOGASTRODUODENOSCOPY (EGD) WITH PROPOFOL;  Surgeon: [name], MD;  Location[facility];  Service: Endoscopy;  Laterality: N/A;  . LUNG SURGERY    . SAVORY DILATION N/A 10/19/2019   Procedure: SAVORY DILATION;  Surgeon: Yetta Flock, MD;  Location: [facility];  Service: Gastroenterology;  Laterality: N/A;  . SAVORY DILATION N/A 11/02/2019   Procedure: SAVORY DILATION;  Surgeon: [name], MD;  Location: [facility];  Service: Endoscopy;  Laterality: N/A;  fluoro  . SAVORY DILATION N/A 11/16/2019   Procedure: SAVORY DILATION;  Surgeon: [name], MD;  Location: [facility];  Service: Endoscopy;  Laterality: N/A;  . SAVORY DILATION N/A 11/29/2019   Procedure: SAVORY DILATION;  Surgeon: [name], MD;  Location: [facility];  Service: Endoscopy;  Laterality: N/A;  . SAVORY DILATION N/A 12/21/2019   Procedure: SAVORY  DILATION;  Surgeon: [name], MD;  Location: [facility];  Service: Endoscopy;  Laterality: N/A;    Prior to Admission medications   Medication Sig Start Date End Date Taking? Authorizing  Provider  albuterol (PROVENTIL HFA;VENTOLIN HFA) 108 (90 Base) MCG/ACT inhaler Inhale 2 puffs into the lungs every 6 (six) hours as needed for wheezing or shortness of breath.    Yes [provider]  albuterol (PROVENTIL) (2.5 MG/3ML) 0.083% nebulizer solution Take 2.5 mg by nebulization every 6 (six) hours as needed for wheezing or shortness of breath.   Yes [provider]  budesonide-formoterol (SYMBICORT) 160-4.5 MCG/ACT inhaler Inhale 1 puff into the lungs daily. 12/21/19  Yes [name], MD  Calcium Citrate-Vitamin D 1000-400 LIQD Take 5 mLs by mouth daily.   Yes [provider]  Cholecalciferol 25 MCG/10ML LIQD Take 1,000 Units by mouth daily.   Yes [provider]  ENSURE (ENSURE) Take 237 mLs by mouth 3 (three) times daily between meals.    Yes [provider]  mirtazapine (REMERON) 15 MG tablet Take 15 mg by mouth at bedtime.   Yes [provider]  omeprazole (PRILOSEC) 40 MG capsule Take 40 mg by mouth in the morning and at bedtime.   Yes [provider]  pregabalin (LYRICA) 150 MG capsule Take 150 mg by mouth 2 (two) times daily.   Yes [provider]  sucralfate (CARAFATE) 1 GM/10ML suspension Take 10 mLs (1 g total) by mouth every 6 (six) hours. Patient taking differently: Take 1 g by mouth 4 (four) times daily.  09/19/19 10/26/21 Yes Harold Hedge, MD  Tiotropium Bromide Monohydrate 2.5 MCG/ACT AERS Inhale 1 puff into the lungs daily.    Yes [provider]  acetaminophen (TYLENOL) 160 MG/5ML elixir Take 320 mg by mouth every 4 (four) hours as needed for fever or pain. Patient not taking: Reported on 01/26/2020    [provider]  acetaminophen (TYLENOL) 500 MG tablet Take 1,000 mg by mouth every 6 (six) hours as needed for moderate pain or headache. Patient not taking: Reported on 01/26/2020    [provider]  CALCIUM CARBONATE-VITAMIN D PO Take 5 mLs by mouth daily. 500 mg [place]  Liquid Patient not taking: Reported on 01/26/2020    [provider]  docusate sodium (COLACE) 100 MG capsule Take 2 capsules (200 mg total) by mouth 2 (two) times daily. Patient not taking: Reported on 01/26/2020 09/19/19   Harold Hedge, MD  fluticasone [facility]) 50 MCG/ACT nasal spray Place 1 spray into both nostrils daily as needed for allergies or rhinitis.    [provider]  lipase/protease/amylase (CREON) 12000 units CPEP capsule Take 24,000 Units by mouth 4 (four) times daily.  Patient not taking: Reported on 01/26/2020    [provider]  ondansetron (ZOFRAN ODT) 4 MG disintegrating tablet Take 1 tablet (4 mg total) by mouth every 8 (eight) hours as needed for nausea or vomiting. Patient not taking: Reported on 01/26/2020 10/21/19   Rai, Vernelle Emerald, MD  pantoprazole (PROTONIX) 40 MG tablet Take 1 tablet (40 mg total) by mouth 2 (two) times daily before a meal. Patient not taking: Reported on 01/26/2020 11/29/19   [name], MD  tiZANidine (ZANAFLEX) 4 MG tablet Take 4 mg by mouth at bedtime. Patient not taking: Reported on 01/26/2020    [provider]    Current Facility-Administered Medications  Medication Dose Route Frequency Provider Last Rate Last Admin  . lactated ringers  infusion   Intravenous Continuous [name], MD 10 mL/hr at 02/07/20 0805 Continued from Pre-op at 02/07/20 0805    Allergies as of 01/19/2020 - Review Complete 12/21/2019  Allergen Reaction Noted  . Azithromycin Other (See Comments) 11/11/2016  . Boniva [ibandronic acid] Other (See Comments) 10/01/2019  . Gabapentin Other (See Comments) 11/11/2016    Family History  Problem Relation Age of Onset  . Diabetes Sister        x 3  . Cancer Sister 36       brain that metastized to bone, stomach and other organs  . Melanoma Maternal Aunt   . Heart attack Father   . Pneumonia Brother   . Cancer Other 17       cancer in his chest - nephew  . Colon cancer Neg Hx   .  Pancreatic cancer Neg Hx   . Rectal cancer Neg Hx   . Esophageal cancer Neg Hx     Social History   Socioeconomic History  . Marital status: Single    Spouse name: Not on file  . Number of children: Not on file  . Years of education: Not on file  . Highest education level: Not on file  Occupational History  . Not on file  Tobacco Use  . Smoking status: Former Smoker    Packs/day: 0.25    Years: 2.00    Pack years: 0.50    Types: Cigarettes    Quit date: 05/30/2019    Years since quitting: 0.6  . Smokeless tobacco: Never Used  . Tobacco comment: 2 months- quit date  Vaping Use  . Vaping Use: Never used  Substance and Sexual Activity  . Alcohol use: No  . Drug use: No  . Sexual activity: Never  Other Topics Concern  . Not on file  Social History Narrative  . Not on file   Social Determinants of Health   Financial Resource Strain:   . Difficulty of Paying Living Expenses:   Food Insecurity:   . Worried About Charity fundraiser in the Last Year:   . Arboriculturist in the Last Year:   Transportation Needs:   . Film/video editor (Medical):   Marland Kitchen Lack of Transportation (Non-Medical):   Physical Activity:   . Days of Exercise per Week:   . Minutes of Exercise per Session:   Stress:   . Feeling of Stress :   Social Connections:   . Frequency of Communication with Friends and Family:   . Frequency of Social Gatherings with Friends and Family:   . Attends Religious Services:   . Active Member of Clubs or Organizations:   . Attends Archivist Meetings:   Marland Kitchen Marital Status:   Intimate Partner Violence:   . Fear of Current or Ex-Partner:   . Emotionally Abused:   Marland Kitchen Physically Abused:   . Sexually Abused:     Review of Systems:  All systems reviewed an negative except where noted in HPI.  Gen: Denies any fever, chills, sweats, anorexia, fatigue, weakness, malaise, weight loss, and sleep disorder CV: Denies chest pain, angina, palpitations, syncope,  orthopnea, PND, peripheral edema, and claudication. Resp: Denies dyspnea at rest, dyspnea with exercise, cough, sputum, wheezing, coughing up blood, and pleurisy. GI: Denies vomiting blood, jaundice, and fecal incontinence.   Denies dysphagia or odynophagia. GU : Denies urinary burning, blood in urine, urinary frequency, urinary hesitancy, nocturnal urination, and urinary incontinence. MS: Denies joint pain, limitation of  movement, and swelling, stiffness, low back pain, extremity pain. Denies muscle weakness, cramps, atrophy.  Derm: Denies rash, itching, dry skin, hives, moles, warts, or unhealing ulcers.  Psych: Denies depression, anxiety, memory loss, suicidal ideation, hallucinations, paranoia, and confusion. Heme: Denies bruising, bleeding, and enlarged lymph nodes. Neuro:  Denies any headaches, dizziness, paresthesias. Endo:  Denies any problems with DM, thyroid, adrenal function.  Physical Exam: Vital signs in last 24 hours: Temp:  [99 F (37.2 C)] 99 F (37.2 C) (07/26 0724) Pulse Rate:  [107] 107 (07/26 0724) Resp:  [17] 17 (07/26 0724) BP: (136)/(81) 136/81 (07/26 0724) SpO2:  [96 %] 96 % (07/26 0724)   General:  Alert, well-developed, in NAD Head:  Normocephalic and atraumatic. Eyes:  Sclera clear, no icterus.   Conjunctiva pink. Ears:  Normal auditory acuity. Mouth:  No deformity or lesions.  Neck:  Supple; no masses . Lungs:  Clear throughout to auscultation.   No wheezes, crackles, or rhonchi. No acute distress. Heart:  Regular rate and rhythm; no murmurs. Abdomen:  Soft, nondistended, nontender. No masses, hepatomegaly. No obvious masses.  Normal bowel .    Rectal:  Not done Msk:  Symmetrical without gross deformities.. Pulses:  Normal pulses noted. Extremities:  Without edema. Neurologic:  Alert and  oriented x4;  grossly normal neurologically. Skin:  Intact without significant lesions or rashes. Cervical Nodes:  No significant cervical adenopathy. Psych:  Alert  and cooperative. Normal mood and affect.   Impression / Plan:   1. Dysphagia with esophageal strictures for repeat dilation. The risks (including bleeding, perforation, infection, missed lesions, medication reactions and possible hospitalization or surgery if complications occur), benefits, and alternatives to endoscopy with possible biopsy and possible dilation were discussed with the patient and they consent to proceed.      LOS: 0 days   Brittani Purdum T. Fuller Plan  02/07/2020, 8:09 AM [phone]

## 2020-02-07 NOTE — Discharge Instructions (Signed)
YOU HAD AN ENDOSCOPIC PROCEDURE TODAY: Refer to the procedure report and other information in the discharge instructions given to you for any specific questions about what was found during the examination. If this information does not answer your questions, please call [place] office at [phone] to clarify.   YOU SHOULD EXPECT: Some feelings of bloating in the abdomen. Passage of more gas than usual. Walking can help get rid of the air that was put into your GI tract during the procedure and reduce the bloating. If you had a lower endoscopy (such as a colonoscopy or flexible sigmoidoscopy) you may notice spotting of blood in your stool or on the toilet paper. Some abdominal soreness may be present for a day or two, also.  DIET: Your first meal following the procedure should be a light meal and then it is ok to progress to your normal diet. A half-sandwich or bowl of soup is an example of a good first meal. Heavy or fried foods are harder to digest and may make you feel nauseous or bloated. Drink plenty of fluids but you should avoid alcoholic beverages for 24 hours. If you had a esophageal dilation, please see attached instructions for diet.    ACTIVITY: Your care partner should take you home directly after the procedure. You should plan to take it easy, moving slowly for the rest of the day. You can resume normal activity the day after the procedure however YOU SHOULD NOT DRIVE, use power tools, machinery or perform tasks that involve climbing or major physical exertion for 24 hours (because of the sedation medicines used during the test).   SYMPTOMS TO REPORT IMMEDIATELY: A gastroenterologist can be reached at any hour. Please call [phone]  for any of the following symptoms:  Following lower endoscopy (colonoscopy, flexible sigmoidoscopy) Excessive amounts of blood in the stool  Significant tenderness, worsening of abdominal pains  Swelling of the abdomen that is new, acute  Fever of 100 or  higher  Following upper endoscopy (EGD, EUS, ERCP, esophageal dilation) Vomiting of blood or coffee ground material  New, significant abdominal pain  New, significant chest pain or pain under the shoulder blades  Painful or persistently difficult swallowing  New shortness of breath  Black, tarry-looking or red, bloody stools  FOLLOW UP:  If any biopsies were taken you will be contacted by phone or by letter within the next 1-3 weeks. Call [phone]  if you have not heard about the biopsies in 3 weeks.  Please also call with any specific questions about appointments or follow up tests.YOU HAD AN ENDOSCOPIC PROCEDURE TODAY: Refer to the procedure report and other information in the discharge instructions given to you for any specific questions about what was found during the examination. If this information does not answer your questions, please call [place] office at [phone] to clarify.   YOU SHOULD EXPECT: Some feelings of bloating in the abdomen. Passage of more gas than usual. Walking can help get rid of the air that was put into your GI tract during the procedure and reduce the bloating. If you had a lower endoscopy (such as a colonoscopy or flexible sigmoidoscopy) you may notice spotting of blood in your stool or on the toilet paper. Some abdominal soreness may be present for a day or two, also.  DIET: Your first meal following the procedure should be a light meal and then it is ok to progress to your normal diet. A half-sandwich or bowl of soup is an example of a   good first meal. Heavy or fried foods are harder to digest and may make you feel nauseous or bloated. Drink plenty of fluids but you should avoid alcoholic beverages for 24 hours. If you had a esophageal dilation, please see attached instructions for diet.    ACTIVITY: Your care partner should take you home directly after the procedure. You should plan to take it easy, moving slowly for the rest of the day. You can resume  normal activity the day after the procedure however YOU SHOULD NOT DRIVE, use power tools, machinery or perform tasks that involve climbing or major physical exertion for 24 hours (because of the sedation medicines used during the test).   SYMPTOMS TO REPORT IMMEDIATELY: A gastroenterologist can be reached at any hour. Please call [phone]  for any of the following symptoms:  Following lower endoscopy (colonoscopy, flexible sigmoidoscopy) Excessive amounts of blood in the stool  Significant tenderness, worsening of abdominal pains  Swelling of the abdomen that is new, acute  Fever of 100 or higher  Following upper endoscopy (EGD, EUS, ERCP, esophageal dilation) Vomiting of blood or coffee ground material  New, significant abdominal pain  New, significant chest pain or pain under the shoulder blades  Painful or persistently difficult swallowing  New shortness of breath  Black, tarry-looking or red, bloody stools  FOLLOW UP:  If any biopsies were taken you will be contacted by phone or by letter within the next 1-3 weeks. Call [phone]  if you have not heard about the biopsies in 3 weeks.  Please also call with any specific questions about appointments or follow up tests. 

## 2020-02-07 NOTE — Op Note (Signed)
[facility] Patient Name: Claudia Wiley Procedure Date: 02/07/2020 MRN: [id number] Attending MD: [name] , MD Date of Birth: 10/08/1955 CSN: [id number] Age: 64 Admit Type: Outpatient Procedure:                Upper GI endoscopy Indications:              Therapeutic procedure for esophageal stricture                            dilation, Dysphagia Providers:                Pricilla Riffle. Fuller Plan, MD, Wynonia Sours, RN, Corie Chiquito, Technician Referring MD:             [facility] Medicines:                Monitored Anesthesia Care Complications:            No immediate complications. Estimated Blood Loss:     Estimated blood loss was minimal. Procedure:                Pre-Anesthesia Assessment:                           - Prior to the procedure, a History and Physical                            was performed, and patient medications and                            allergies were reviewed. The patient's tolerance of                            previous anesthesia was also reviewed. The risks                            and benefits of the procedure and the sedation                            options and risks were discussed with the patient.                            All questions were answered, and informed consent                            was obtained. Prior Anticoagulants: The patient has                            taken no previous anticoagulant or antiplatelet                            agents. ASA Grade Assessment: III - A patient with  severe systemic disease. After reviewing the risks                            and benefits, the patient was deemed in                            satisfactory condition to undergo the procedure.                           After obtaining informed consent, the endoscope was                            passed under direct vision. Throughout the                             procedure, the patient's blood pressure, pulse, and                            oxygen saturations were monitored continuously. The                            GIF-H190 ([id number]) Olympus gastroscope was                            introduced through the mouth, and advanced to the                            second part of duodenum. The upper GI endoscopy was                            accomplished without difficulty. The patient                            tolerated the procedure well. Scope In: Scope Out: Findings:      Multiple benign-appearing, intrinsic moderate stenoses were found in the       mid and distal esophagus esophagus. The narrowest stenosis measured 1 cm       (inner diameter) x less than one cm (in length). The stenoses were       traversed after dilation. A guidewire was placed and the scope was       withdrawn. Dilations were performed with Savary dilators with mild       resistance at 12 mm, 12.8 mm, 14 mm and 15 mm. The dilation site was       examined following endoscope reinsertion and showed moderate mucosal       disruption and moderate improvement in luminal narrowing. Estimated       blood loss was minimal.      The exam of the esophagus was otherwise normal.      A small hiatal hernia was present.      The exam of the stomach was otherwise normal.      The duodenal bulb and second portion of the duodenum were normal. Impression:               - Benign-appearing esophageal stenoses. Dilated.                           -  Small hiatal hernia.                           - Normal duodenal bulb and second portion of the                            duodenum.                           - No specimens collected. Moderate Sedation:      Not Applicable - Patient had care per Anesthesia. Recommendation:           - Patient has a contact number available for                            emergencies. The signs and symptoms of potential                            delayed  complications were discussed with the                            patient. Return to normal activities tomorrow.                            Written discharge instructions were provided to the                            patient.                           - Clear liquid diet for 2 hours, then advance as                            tolerated to soft diet today. Maintain soft diet                            until next dilation.                           - Continue present medications.                           - Repeat upper endoscopy with dilation in 1 month. Procedure Code(s):        --- Professional ---                           [phone], Esophagogastroduodenoscopy, flexible,                            transoral; with insertion of guide wire followed by                            passage of dilator(s) through esophagus over guide                            wire Diagnosis Code(s):        ---  Professional ---                           K22.2, Esophageal obstruction                           K44.9, Diaphragmatic hernia without obstruction or                            gangrene                           R13.10, Dysphagia, unspecified CPT copyright 2019 [employer]. All rights reserved. The codes documented in this report are preliminary and upon coder review may  be revised to meet current compliance requirements. [name], MD 02/07/2020 8:36:40 AM This report has been signed electronically. Number of Addenda: 0

## 2020-02-07 NOTE — Anesthesia Preprocedure Evaluation (Addendum)
Anesthesia Evaluation  Patient identified by MRN, date of birth, ID band Patient awake    Reviewed: Allergy & Precautions, NPO status , Patient's Chart, lab work & pertinent test results  History of Anesthesia Complications (+) PONV  Airway Mallampati: I  TM Distance: >3 FB Neck ROM: Full    Dental  (+) Edentulous Upper, Edentulous Lower   Pulmonary COPD,  COPD inhaler and oxygen dependent, Current Smoker and Patient abstained from smoking.,  02/03/2020 SARS coronavirus NEG lung cancer    + wheezing (pt using MDI this am)      Cardiovascular hypertension (off meds now),  Rhythm:Regular Rate:Tachycardia  09/2019 ECHO: EF 60-65%, mild MR   Neuro/Psych negative neurological ROS     GI/Hepatic Neg liver ROS, GERD  Medicated and Controlled,H/o stomach cancer: gastronoma   Endo/Other  negative endocrine ROS  Renal/GU negative Renal ROS     Musculoskeletal  (+) Arthritis ,   Abdominal   Peds  Hematology negative hematology ROS (+)   Anesthesia Other Findings   Reproductive/Obstetrics                            Anesthesia Physical Anesthesia Plan  ASA: III  Anesthesia Plan: MAC   Post-op Pain Management:    Induction:   PONV Risk Score and Plan: 2 and Ondansetron and Treatment may vary due to age or medical condition  Airway Management Planned: Natural Airway and Nasal Cannula  Additional Equipment:   Intra-op Plan:   Post-operative Plan:   Informed Consent: I have reviewed the patients History and Physical, chart, labs and discussed the procedure including the risks, benefits and alternatives for the proposed anesthesia with the patient or authorized representative who has indicated his/her understanding and acceptance.       Plan Discussed with: CRNA and Surgeon  Anesthesia Plan Comments:        Anesthesia Quick Evaluation

## 2020-02-07 NOTE — Transfer of Care (Addendum)
Immediate Anesthesia Transfer of Care Note  Patient: Claudia Wiley  Procedure(s) Performed: Procedure(s): ESOPHAGOGASTRODUODENOSCOPY (EGD) WITH PROPOFOL (N/A) SAVORY DILATION (N/A)  Patient Location: PACU  Anesthesia Type: MAC  Level of Consciousness:  sedated, patient cooperative and responds to stimulation  Airway & Oxygen Therapy:Patient Spontanous Breathing and Patient connected to face mask oxgen  Post-op Assessment:  Report given to PACU RN and Post -op Vital signs reviewed and stable  Post vital signs:  Reviewed and stable  Last Vitals:  Vitals:   02/07/20 0724  BP: (!) 136/81  Pulse: (!) 107  Resp: 17  Temp: 37.2 C  SpO2: 89%    Complications: No apparent anesthesia complications

## 2020-02-07 NOTE — Anesthesia Postprocedure Evaluation (Signed)
Anesthesia Post Note  Patient: Claudia Wiley  Procedure(s) Performed: ESOPHAGOGASTRODUODENOSCOPY (EGD) WITH PROPOFOL (N/A ) SAVORY DILATION (N/A )     Patient location during evaluation: Endoscopy Anesthesia Type: MAC Level of consciousness: awake and alert, oriented and patient cooperative Pain management: pain level controlled Vital Signs Assessment: post-procedure vital signs reviewed and stable Respiratory status: spontaneous breathing, nonlabored ventilation, respiratory function stable and patient connected to nasal cannula oxygen Cardiovascular status: blood pressure returned to baseline and stable Postop Assessment: no apparent nausea or vomiting Anesthetic complications: no   No complications documented.  Last Vitals:  Vitals:   02/07/20 0724 02/07/20 0836  BP: (!) 136/81 102/69  Pulse: (!) 107 99  Resp: 17 19  Temp: 37.2 C 36.7 C  SpO2: 96% 100%    Last Pain:  Vitals:   02/07/20 0836  TempSrc: Oral  PainSc: 0-No pain                 Angy Swearengin,E. Annalena Piatt

## 2020-02-08 ENCOUNTER — Encounter (HOSPITAL_COMMUNITY): Payer: Self-pay | Admitting: Gastroenterology

## 2020-02-23 ENCOUNTER — Other Ambulatory Visit: Payer: Self-pay

## 2020-02-23 ENCOUNTER — Telehealth: Payer: Self-pay | Admitting: Gastroenterology

## 2020-02-23 DIAGNOSIS — K222 Esophageal obstruction: Secondary | ICD-10-CM

## 2020-02-23 DIAGNOSIS — R1310 Dysphagia, unspecified: Secondary | ICD-10-CM

## 2020-02-23 NOTE — Telephone Encounter (Signed)
Scheduled patient for EGD with Dilation with Dr. Fuller Plan on 03/09/20 at 8:45 am at [facility]. COVID testing scheduled for 03/06/20 at 8:10 am. Patient and patient's daughter are aware of both appt dates and times. Provided address for new COVID testing location.

## 2020-02-23 NOTE — Telephone Encounter (Signed)
New order in epic for EGD w/ dilation. Called hospital to schedule, epic currently down, will have to call back to schedule.

## 2020-03-02 ENCOUNTER — Encounter (HOSPITAL_COMMUNITY): Payer: Self-pay | Admitting: Gastroenterology

## 2020-03-06 ENCOUNTER — Other Ambulatory Visit (HOSPITAL_COMMUNITY)
Admission: RE | Admit: 2020-03-06 | Discharge: 2020-03-06 | Disposition: A | Discharge status: Home / Self Care | Payer: No Typology Code available for payment source | Source: Ambulatory Visit | Attending: Gastroenterology | Admitting: Gastroenterology

## 2020-03-06 DIAGNOSIS — Z01812 Encounter for preprocedural laboratory examination: Secondary | ICD-10-CM | POA: Diagnosis present

## 2020-03-06 DIAGNOSIS — Z20822 Contact with and (suspected) exposure to covid-19: Secondary | ICD-10-CM | POA: Insufficient documentation

## 2020-03-06 LAB — SARS CORONAVIRUS 2 (TAT 6-24 HRS): SARS Coronavirus 2: NEGATIVE

## 2020-03-08 ENCOUNTER — Other Ambulatory Visit: Payer: Self-pay

## 2020-03-08 DIAGNOSIS — K222 Esophageal obstruction: Secondary | ICD-10-CM

## 2020-03-08 DIAGNOSIS — R1310 Dysphagia, unspecified: Secondary | ICD-10-CM

## 2020-03-09 ENCOUNTER — Other Ambulatory Visit (HOSPITAL_COMMUNITY)
Admission: RE | Admit: 2020-03-09 | Discharge: 2020-03-09 | Disposition: A | Discharge status: Home / Self Care | Payer: No Typology Code available for payment source | Source: Ambulatory Visit | Attending: Gastroenterology | Admitting: Gastroenterology

## 2020-03-09 DIAGNOSIS — Z01812 Encounter for preprocedural laboratory examination: Secondary | ICD-10-CM | POA: Insufficient documentation

## 2020-03-09 DIAGNOSIS — Z20822 Contact with and (suspected) exposure to covid-19: Secondary | ICD-10-CM | POA: Insufficient documentation

## 2020-03-09 LAB — SARS CORONAVIRUS 2 (TAT 6-24 HRS): SARS Coronavirus 2: NEGATIVE

## 2020-03-13 ENCOUNTER — Ambulatory Visit (HOSPITAL_COMMUNITY)
Admission: RE | Admit: 2020-03-13 | Discharge: 2020-03-13 | Disposition: A | Discharge status: Home / Self Care | Payer: No Typology Code available for payment source | Attending: Gastroenterology | Admitting: Gastroenterology

## 2020-03-13 ENCOUNTER — Encounter (HOSPITAL_COMMUNITY): Payer: Self-pay | Admitting: Gastroenterology

## 2020-03-13 ENCOUNTER — Ambulatory Visit (HOSPITAL_COMMUNITY): Payer: No Typology Code available for payment source | Admitting: Registered Nurse

## 2020-03-13 ENCOUNTER — Encounter (HOSPITAL_COMMUNITY)
Admission: RE | Disposition: A | Discharge status: Home / Self Care | Payer: Self-pay | Source: Home / Self Care | Attending: Gastroenterology

## 2020-03-13 ENCOUNTER — Other Ambulatory Visit: Payer: Self-pay

## 2020-03-13 DIAGNOSIS — Z902 Acquired absence of lung [part of]: Secondary | ICD-10-CM | POA: Diagnosis not present

## 2020-03-13 DIAGNOSIS — J449 Chronic obstructive pulmonary disease, unspecified: Secondary | ICD-10-CM | POA: Diagnosis not present

## 2020-03-13 DIAGNOSIS — R1310 Dysphagia, unspecified: Secondary | ICD-10-CM | POA: Diagnosis present

## 2020-03-13 DIAGNOSIS — K449 Diaphragmatic hernia without obstruction or gangrene: Secondary | ICD-10-CM | POA: Diagnosis not present

## 2020-03-13 DIAGNOSIS — K222 Esophageal obstruction: Secondary | ICD-10-CM | POA: Diagnosis not present

## 2020-03-13 HISTORY — PX: ESOPHAGEAL DILATION: SHX303

## 2020-03-13 HISTORY — PX: ESOPHAGOGASTRODUODENOSCOPY (EGD) WITH PROPOFOL: SHX5813

## 2020-03-13 LAB — POCT I-STAT, CHEM 8
BUN: 6 mg/dL — ABNORMAL LOW (ref 8–23)
Calcium, Ion: 1.23 mmol/L (ref 1.15–1.40)
Chloride: 101 mmol/L (ref 98–111)
Creatinine, Ser: 0.5 mg/dL (ref 0.44–1.00)
Glucose, Bld: 96 mg/dL (ref 70–99)
HCT: 46 % (ref 36.0–46.0)
Hemoglobin: 15.6 g/dL — ABNORMAL HIGH (ref 12.0–15.0)
Potassium: 4.2 mmol/L (ref 3.5–5.1)
Sodium: 144 mmol/L (ref 135–145)
TCO2: 31 mmol/L (ref 22–32)

## 2020-03-13 SURGERY — ESOPHAGOGASTRODUODENOSCOPY (EGD) WITH PROPOFOL
Anesthesia: Monitor Anesthesia Care

## 2020-03-13 MED ORDER — LACTATED RINGERS IV SOLN
INTRAVENOUS | Status: AC | PRN
  Administered 2020-03-13: 1000 mL via INTRAVENOUS

## 2020-03-13 MED ORDER — PROPOFOL 500 MG/50ML IV EMUL
INTRAVENOUS | Status: DC | PRN
  Administered 2020-03-13: 140 ug/kg/min via INTRAVENOUS

## 2020-03-13 MED ORDER — ONDANSETRON HCL 4 MG/2ML IJ SOLN
INTRAMUSCULAR | Status: DC | PRN
  Administered 2020-03-13: 4 mg via INTRAVENOUS

## 2020-03-13 MED ORDER — PROPOFOL 10 MG/ML IV BOLUS
INTRAVENOUS | Status: DC | PRN
  Administered 2020-03-13: 10 mg via INTRAVENOUS
  Administered 2020-03-13: 20 mg via INTRAVENOUS

## 2020-03-13 SURGICAL SUPPLY — 14 items

## 2020-03-13 NOTE — H&P (Signed)
Severe esophageal strictures for repeat dilation. Dysphagia improved but persists.     Objective   General:   Older, thin, frail-appearing female in NAD Heart:  Regular rate and rhythm; no murmurs Lungs: Respirations even and unlabored, lungs CTA bilaterally Abdomen:  Soft, nontender and nondistended. Normal bowel sounds. Extremities:  Without edema. Neurologic:  Alert and oriented,  grossly normal neurologically. Psych:  Cooperative. Normal mood and affect.    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  EGD 10/03/2019 showed grade D esophagitis with distal esophageal lumen of 9 mm, she had scattered plaques consistent with candidiasis and retained food in the stomach suggestive of gastroparesis Biopsies showed acute esophagitis with ulceration, no CMV or HSV.  Positive for candidiasis and she has completed a course of Diflucan                 Several EGDs with dilations over the past few months. Her dysphagia has improved however it persists.                   Last EGD on 7/26:                  Benign-appearing esophageal stenoses, 1 cm diameter, dilated to 15 mm, small hiatal hernia.                     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

## 2020-03-13 NOTE — Anesthesia Postprocedure Evaluation (Signed)
Anesthesia Post Note  Patient: Claudia Wiley  Procedure(s) Performed: ESOPHAGOGASTRODUODENOSCOPY (EGD) WITH PROPOFOL (N/A ) ESOPHAGEAL DILATION     Patient location during evaluation: PACU Anesthesia Type: MAC Level of consciousness: awake and alert Pain management: pain level controlled Vital Signs Assessment: post-procedure vital signs reviewed and stable Respiratory status: spontaneous breathing, nonlabored ventilation and respiratory function stable Cardiovascular status: blood pressure returned to baseline and stable Postop Assessment: no apparent nausea or vomiting Anesthetic complications: no   No complications documented.  Last Vitals:  Vitals:   03/13/20 1400 03/13/20 1410  BP: (!) 103/55 (!) 98/56  Pulse: (!) 101   Resp: 19   Temp: 36.6 C   SpO2: 100%     Last Pain:  Vitals:   03/13/20 1410  TempSrc:   PainSc: 0-No pain                 Pervis Hocking

## 2020-03-13 NOTE — Anesthesia Preprocedure Evaluation (Addendum)
Anesthesia Evaluation  Patient identified by MRN, date of birth, ID band Patient awake    Reviewed: Allergy & Precautions, NPO status , Patient's Chart, lab work & pertinent test results  History of Anesthesia Complications (+) PONV  Airway Mallampati: II  TM Distance: >3 FB     Dental   Pulmonary pneumonia, COPD, Patient abstained from smoking., former smoker,    breath sounds clear to auscultation       Cardiovascular hypertension,  Rhythm:Regular Rate:Bradycardia     Neuro/Psych    GI/Hepatic Neg liver ROS, GERD  ,History noted CG   Endo/Other  negative endocrine ROS  Renal/GU negative Renal ROS     Musculoskeletal  (+) Arthritis ,   Abdominal   Peds  Hematology  (+) anemia ,   Anesthesia Other Findings   Reproductive/Obstetrics                             Anesthesia Physical Anesthesia Plan  ASA: III  Anesthesia Plan: MAC   Post-op Pain Management:    Induction: Intravenous  PONV Risk Score and Plan: 3 and Propofol infusion  Airway Management Planned: Nasal Cannula and Simple Face Mask  Additional Equipment:   Intra-op Plan:   Post-operative Plan:   Informed Consent: I have reviewed the patients History and Physical, chart, labs and discussed the procedure including the risks, benefits and alternatives for the proposed anesthesia with the patient or authorized representative who has indicated his/her understanding and acceptance.     Dental advisory given  Plan Discussed with: Anesthesiologist and CRNA  Anesthesia Plan Comments:         Anesthesia Quick Evaluation

## 2020-03-13 NOTE — Op Note (Signed)
[facility] Patient Name: Claudia Wiley Procedure Date: 03/13/2020 MRN: [id number] Attending MD: [name] , MD Date of Birth: 02-28-1956 CSN: [id number] Age: 64 Admit Type: Outpatient Procedure:                Upper GI endoscopy Indications:              Therapeutic procedure, Dysphagia Providers:                Pricilla Riffle. Fuller Plan, MD, Benetta Spar RN, RN,                            Janee Morn, Technician, Courtney Heys [place],                            CRNA Referring MD:             [facility] Medicines:                Monitored Anesthesia Care Complications:            No immediate complications. Estimated Blood Loss:     Estimated blood loss was minimal. Procedure:                Pre-Anesthesia Assessment:                           - Prior to the procedure, a History and Physical                            was performed, and patient medications and                            allergies were reviewed. The patient's tolerance of                            previous anesthesia was also reviewed. The risks                            and benefits of the procedure and the sedation                            options and risks were discussed with the patient.                            All questions were answered, and informed consent                            was obtained. Prior Anticoagulants: The patient has                            taken no previous anticoagulant or antiplatelet                            agents. ASA Grade Assessment: III - A patient with  severe systemic disease. After reviewing the risks                            and benefits, the patient was deemed in                            satisfactory condition to undergo the procedure.                           After obtaining informed consent, the endoscope was                            passed under direct vision. Throughout the                             procedure, the patient's blood pressure, pulse, and                            oxygen saturations were monitored continuously. The                            GIF-H190 ([id number]) Olympus gastroscope was                            introduced through the mouth, and advanced to the                            second part of duodenum. The upper GI endoscopy was                            accomplished without difficulty. Only a few photos                            obtained as she has had several recent EGDs. The                            patient tolerated the procedure well. Scope In: Scope Out: Findings:      Multiple benign-appearing, intrinsic moderate stenoses were found in the       mid and distal esophagus. The narrowest stenosis measured 1 cm (inner       diameter) x less than one cm (in length). The stenoses were traversed. A       guidewire was placed and the scope was withdrawn. Dilation was performed       with a Savary dilator with no resistance at 12 mm. Dilations were       performed with Savary dilators with mild resistance at 12.8 mm, 14 mm       and 15 mm. Trace of heme on last dilator.      The exam of the esophagus was otherwise normal.      A small hiatal hernia was present.      The exam of the stomach was otherwise normal.      The duodenal bulb and second portion of the duodenum were normal. Impression:               -  Benign-appearing esophageal stenoses. Dilated to                            15 mm.                           - Small hiatal hernia.                           - Normal duodenal bulb and second portion of the                            duodenum.                           - No specimens collected. Moderate Sedation:      Not Applicable - Patient had care per Anesthesia. Recommendation:           - Patient has a contact number available for                            emergencies. The signs and symptoms of potential                            delayed  complications were discussed with the                            patient. Return to normal activities tomorrow.                            Written discharge instructions were provided to the                            patient.                           - Clear liquid diet for 2 hours, then advance as                            tolerated to full liquid to soft diet today as                            tolerated.                           - Continue present medications including                            pantoprazole 40 mg po bid.                           - Repeat upper endoscopy with dilation in about 6                            weeks. Procedure Code(s):        --- Professional ---  43248, Esophagogastroduodenoscopy, flexible,                            transoral; with insertion of guide wire followed by                            passage of dilator(s) through esophagus over guide                            wire Diagnosis Code(s):        --- Professional ---                           K22.2, Esophageal obstruction                           K44.9, Diaphragmatic hernia without obstruction or                            gangrene                           R13.10, Dysphagia, unspecified CPT copyright 2019 [employer]. All rights reserved. The codes documented in this report are preliminary and upon coder review may  be revised to meet current compliance requirements. [name], MD 03/13/2020 1:48:23 PM This report has been signed electronically. Number of Addenda: 0

## 2020-03-13 NOTE — Discharge Instructions (Signed)

## 2020-03-13 NOTE — Transfer of Care (Signed)
Immediate Anesthesia Transfer of Care Note  Patient: [place]  Procedure(s) Performed: ESOPHAGOGASTRODUODENOSCOPY (EGD) WITH PROPOFOL (N/A ) ESOPHAGEAL DILATION  Patient Location: PACU and Endoscopy Unit  Anesthesia Type:MAC  Level of Consciousness: awake, alert , oriented and patient cooperative  Airway & Oxygen Therapy: Patient Spontanous Breathing and Patient connected to face mask oxygen  Post-op Assessment: Report given to RN, Post -op Vital signs reviewed and stable and Patient moving all extremities  Post vital signs: Reviewed and stable  Last Vitals:  Vitals Value Taken Time  BP 94/47 03/13/20 1352  Temp    Pulse 101 03/13/20 1354  Resp 19 03/13/20 1354  SpO2 100 % 03/13/20 1354  Vitals shown include unvalidated device data.  Last Pain:  Vitals:   03/13/20 1352  TempSrc:   PainSc: 0-No pain         Complications: No complications documented.

## 2020-03-15 ENCOUNTER — Telehealth: Payer: Self-pay

## 2020-03-15 ENCOUNTER — Encounter (HOSPITAL_COMMUNITY): Payer: Self-pay | Admitting: Gastroenterology

## 2020-03-15 ENCOUNTER — Other Ambulatory Visit: Payer: Self-pay

## 2020-03-15 DIAGNOSIS — R1310 Dysphagia, unspecified: Secondary | ICD-10-CM

## 2020-03-15 NOTE — Telephone Encounter (Signed)
Left message for patient to call back Case scheduled at [facility] for 04/25/20 10:45 arrival for 12:15.

## 2020-03-15 NOTE — Telephone Encounter (Signed)
-----   Message from [name], MD sent at 03/13/2020  2:00 PM EDT ----- Barbera Setters, See today's EGD. She will need EGD with dilation in about 6 weeks at [facility]. Thx. MS

## 2020-03-15 NOTE — Telephone Encounter (Signed)
Patient notified of the case.  She is scheduled for COVID screen 04/21/20

## 2020-03-28 ENCOUNTER — Telehealth: Payer: Self-pay | Admitting: Gastroenterology

## 2020-03-28 NOTE — Telephone Encounter (Signed)
Where do I find the form to do this?   He says it is a Request for additional services form?  He says we have to do it as a form, not a call

## 2020-03-28 NOTE — Telephone Encounter (Signed)
The [place] auth expires on 04/23/2020 therefore we would need to contact them to extend the auth number. That's probably what Go[employer] is talking about.

## 2020-03-28 NOTE — Telephone Encounter (Signed)
[employer] from [place] is requesting a call back from a nurse to discuss future appts the pt needs.  CB [phone]

## 2020-03-28 NOTE — Telephone Encounter (Signed)
It's usually in the packet they send over from the [place]  It would be one of the last sheet of paper. This paper as far as I know is when you are referring pt to another doctor.

## 2020-03-28 NOTE — Telephone Encounter (Signed)
[employer] reports that they want to be proactive in authorizing additional appointments for Claudia Wiley with the [place].  They are asking that we send a Request for additional services for Claudia Wiley to [phone].  For dates after 04/25/20 EGD.    Claudia Wiley, or Claudia Wiley is this something you are familiar  with for the [place]??  Please let me know if you can help

## 2020-03-29 NOTE — Telephone Encounter (Signed)
Form faxed

## 2020-04-06 ENCOUNTER — Other Ambulatory Visit: Payer: Self-pay

## 2020-04-06 ENCOUNTER — Emergency Department (HOSPITAL_COMMUNITY): Payer: No Typology Code available for payment source

## 2020-04-06 ENCOUNTER — Inpatient Hospital Stay (HOSPITAL_COMMUNITY)
Admission: EM | Admit: 2020-04-06 | Discharge: 2020-04-19 | DRG: 393 | Disposition: A | Discharge status: Home Health | Payer: No Typology Code available for payment source | Attending: Internal Medicine | Admitting: Internal Medicine

## 2020-04-06 DIAGNOSIS — K140 Glossitis: Secondary | ICD-10-CM | POA: Diagnosis not present

## 2020-04-06 DIAGNOSIS — Z902 Acquired absence of lung [part of]: Secondary | ICD-10-CM

## 2020-04-06 DIAGNOSIS — R490 Dysphonia: Secondary | ICD-10-CM | POA: Diagnosis not present

## 2020-04-06 DIAGNOSIS — K8689 Other specified diseases of pancreas: Secondary | ICD-10-CM | POA: Diagnosis present

## 2020-04-06 DIAGNOSIS — R1310 Dysphagia, unspecified: Secondary | ICD-10-CM

## 2020-04-06 DIAGNOSIS — Z888 Allergy status to other drugs, medicaments and biological substances status: Secondary | ICD-10-CM

## 2020-04-06 DIAGNOSIS — J9611 Chronic respiratory failure with hypoxia: Secondary | ICD-10-CM | POA: Diagnosis present

## 2020-04-06 DIAGNOSIS — Z8711 Personal history of peptic ulcer disease: Secondary | ICD-10-CM

## 2020-04-06 DIAGNOSIS — Z833 Family history of diabetes mellitus: Secondary | ICD-10-CM

## 2020-04-06 DIAGNOSIS — R64 Cachexia: Secondary | ICD-10-CM | POA: Diagnosis not present

## 2020-04-06 DIAGNOSIS — R109 Unspecified abdominal pain: Secondary | ICD-10-CM | POA: Diagnosis present

## 2020-04-06 DIAGNOSIS — Z85118 Personal history of other malignant neoplasm of bronchus and lung: Secondary | ICD-10-CM

## 2020-04-06 DIAGNOSIS — I10 Essential (primary) hypertension: Secondary | ICD-10-CM | POA: Diagnosis present

## 2020-04-06 DIAGNOSIS — E860 Dehydration: Secondary | ICD-10-CM | POA: Diagnosis present

## 2020-04-06 DIAGNOSIS — R1314 Dysphagia, pharyngoesophageal phase: Secondary | ICD-10-CM | POA: Diagnosis present

## 2020-04-06 DIAGNOSIS — K269 Duodenal ulcer, unspecified as acute or chronic, without hemorrhage or perforation: Secondary | ICD-10-CM | POA: Diagnosis present

## 2020-04-06 DIAGNOSIS — K311 Adult hypertrophic pyloric stenosis: Secondary | ICD-10-CM

## 2020-04-06 DIAGNOSIS — Z79899 Other long term (current) drug therapy: Secondary | ICD-10-CM

## 2020-04-06 DIAGNOSIS — K219 Gastro-esophageal reflux disease without esophagitis: Secondary | ICD-10-CM | POA: Diagnosis present

## 2020-04-06 DIAGNOSIS — Z0189 Encounter for other specified special examinations: Secondary | ICD-10-CM

## 2020-04-06 DIAGNOSIS — R54 Age-related physical debility: Secondary | ICD-10-CM | POA: Diagnosis present

## 2020-04-06 DIAGNOSIS — J449 Chronic obstructive pulmonary disease, unspecified: Secondary | ICD-10-CM | POA: Diagnosis present

## 2020-04-06 DIAGNOSIS — L299 Pruritus, unspecified: Secondary | ICD-10-CM | POA: Diagnosis present

## 2020-04-06 DIAGNOSIS — K315 Obstruction of duodenum: Secondary | ICD-10-CM

## 2020-04-06 DIAGNOSIS — D379 Neoplasm of uncertain behavior of digestive organ, unspecified: Secondary | ICD-10-CM | POA: Diagnosis present

## 2020-04-06 DIAGNOSIS — K551 Chronic vascular disorders of intestine: Secondary | ICD-10-CM | POA: Diagnosis not present

## 2020-04-06 DIAGNOSIS — Z7951 Long term (current) use of inhaled steroids: Secondary | ICD-10-CM

## 2020-04-06 DIAGNOSIS — R7303 Prediabetes: Secondary | ICD-10-CM | POA: Diagnosis present

## 2020-04-06 DIAGNOSIS — Z20822 Contact with and (suspected) exposure to covid-19: Secondary | ICD-10-CM | POA: Diagnosis present

## 2020-04-06 DIAGNOSIS — Z681 Body mass index (BMI) 19 or less, adult: Secondary | ICD-10-CM

## 2020-04-06 DIAGNOSIS — R197 Diarrhea, unspecified: Secondary | ICD-10-CM

## 2020-04-06 DIAGNOSIS — K449 Diaphragmatic hernia without obstruction or gangrene: Secondary | ICD-10-CM | POA: Diagnosis present

## 2020-04-06 DIAGNOSIS — E162 Hypoglycemia, unspecified: Secondary | ICD-10-CM | POA: Diagnosis not present

## 2020-04-06 DIAGNOSIS — Z85028 Personal history of other malignant neoplasm of stomach: Secondary | ICD-10-CM

## 2020-04-06 DIAGNOSIS — Z87891 Personal history of nicotine dependence: Secondary | ICD-10-CM

## 2020-04-06 DIAGNOSIS — K3184 Gastroparesis: Secondary | ICD-10-CM | POA: Diagnosis present

## 2020-04-06 DIAGNOSIS — G8929 Other chronic pain: Secondary | ICD-10-CM | POA: Diagnosis present

## 2020-04-06 DIAGNOSIS — D649 Anemia, unspecified: Secondary | ICD-10-CM | POA: Diagnosis present

## 2020-04-06 DIAGNOSIS — E43 Unspecified severe protein-calorie malnutrition: Secondary | ICD-10-CM | POA: Diagnosis present

## 2020-04-06 DIAGNOSIS — K222 Esophageal obstruction: Secondary | ICD-10-CM

## 2020-04-06 DIAGNOSIS — R1110 Vomiting, unspecified: Secondary | ICD-10-CM

## 2020-04-06 DIAGNOSIS — Z808 Family history of malignant neoplasm of other organs or systems: Secondary | ICD-10-CM

## 2020-04-06 DIAGNOSIS — Z8249 Family history of ischemic heart disease and other diseases of the circulatory system: Secondary | ICD-10-CM

## 2020-04-06 LAB — CBC
HCT: 45.3 % (ref 36.0–46.0)
Hemoglobin: 14.4 g/dL (ref 12.0–15.0)
MCH: 26.2 pg (ref 26.0–34.0)
MCHC: 31.8 g/dL (ref 30.0–36.0)
MCV: 82.5 fL (ref 80.0–100.0)
Platelets: 320 10*3/uL (ref 150–400)
RBC: 5.49 MIL/uL — ABNORMAL HIGH (ref 3.87–5.11)
RDW: 15.9 % — ABNORMAL HIGH (ref 11.5–15.5)
WBC: 10 10*3/uL (ref 4.0–10.5)
nRBC: 0 % (ref 0.0–0.2)

## 2020-04-06 LAB — COMPREHENSIVE METABOLIC PANEL
ALT: 13 U/L (ref 0–44)
AST: 29 U/L (ref 15–41)
Albumin: 4.5 g/dL (ref 3.5–5.0)
Alkaline Phosphatase: 73 U/L (ref 38–126)
Anion gap: 20 — ABNORMAL HIGH (ref 5–15)
BUN: 30 mg/dL — ABNORMAL HIGH (ref 8–23)
CO2: 27 mmol/L (ref 22–32)
Calcium: 9.3 mg/dL (ref 8.9–10.3)
Chloride: 94 mmol/L — ABNORMAL LOW (ref 98–111)
Creatinine, Ser: 0.79 mg/dL (ref 0.44–1.00)
GFR calc Af Amer: >60 mL/min (ref >60)
GFR calc non Af Amer: >60 mL/min (ref >60)
Glucose, Bld: 85 mg/dL (ref 70–99)
Potassium: 4.4 mmol/L (ref 3.5–5.1)
Sodium: 141 mmol/L (ref 135–145)
Total Bilirubin: 1.1 mg/dL (ref 0.3–1.2)
Total Protein: 7.4 g/dL (ref 6.5–8.1)

## 2020-04-06 LAB — URINALYSIS, ROUTINE W REFLEX MICROSCOPIC
Bacteria, UA: NONE SEEN
Bilirubin Urine: NEGATIVE
Glucose, UA: NEGATIVE mg/dL
Ketones, ur: 80 mg/dL — AB
Leukocytes,Ua: NEGATIVE
Nitrite: NEGATIVE
Protein, ur: 30 mg/dL — AB
Specific Gravity, Urine: 1.027 (ref 1.005–1.030)
pH: 5 (ref 5.0–8.0)

## 2020-04-06 LAB — PROTIME-INR
INR: 1.1 (ref 0.8–1.2)
Prothrombin Time: 13.4 seconds (ref 11.4–15.2)

## 2020-04-06 LAB — APTT: aPTT: 33 seconds (ref 24–36)

## 2020-04-06 LAB — LACTIC ACID, PLASMA: Lactic Acid, Venous: 1.6 mmol/L (ref 0.5–1.9)

## 2020-04-06 MED ORDER — LACTATED RINGERS IV BOLUS (SEPSIS)
500.0000 mL | Freq: Once | INTRAVENOUS | Status: AC
  Administered 2020-04-06: 500 mL via INTRAVENOUS

## 2020-04-06 MED ORDER — MORPHINE SULFATE (PF) 2 MG/ML IV SOLN
2.0000 mg | Freq: Once | INTRAVENOUS | Status: AC
  Administered 2020-04-06: 2 mg via INTRAVENOUS
  Filled 2020-04-06: qty 1

## 2020-04-06 MED ORDER — ONDANSETRON HCL 4 MG/2ML IJ SOLN
4.0000 mg | Freq: Once | INTRAMUSCULAR | Status: AC
  Administered 2020-04-06: 4 mg via INTRAVENOUS
  Filled 2020-04-06: qty 2

## 2020-04-06 MED ORDER — IOHEXOL 300 MG/ML  SOLN
75.0000 mL | Freq: Once | INTRAMUSCULAR | Status: AC | PRN
  Administered 2020-04-06: 60 mL via INTRAVENOUS

## 2020-04-06 NOTE — ED Triage Notes (Signed)
Pt arrived via EMS from home, c/o dehydration and diarrhea. Pt has to have esophageal stretching every 2 weeks which has caused increased poor PO intake and diffuse abd pain.

## 2020-04-06 NOTE — ED Provider Notes (Signed)
[facility] DEPT Provider Note   CSN: [id number] Arrival date & time: 04/06/20  1756     History Chief Complaint  Patient presents with  . Dehydration  . Diarrhea    Claudia Wiley is a 64 y.o. female.  The history is provided by the patient.  Diarrhea Quality:  Watery Severity:  Moderate Onset quality:  Gradual Duration:  1 day Timing:  Constant Relieved by:  Nothing Worsened by:  Nothing Ineffective treatments:  None tried Associated symptoms: abdominal pain and vomiting   Associated symptoms: no arthralgias, no chills, no fever and no headaches        Past Medical History:  Diagnosis Date  . Anemia   . Arthritis   . Cancer [facility]) 2013   Upper left lung and stomach cancer  . COPD (chronic obstructive pulmonary disease) ([place])   . Esophageal stricture   . Family history of brain cancer   . Family history of melanoma   . Gastrinoma   . Gastrinoma   . GERD (gastroesophageal reflux disease)   . Hypertension   . Osteoporosis   . Pancreatic insufficiency   . Pneumonia   . PONV (postoperative nausea and vomiting)   . Vitamin D deficiency   . Weight loss, unintentional     Patient Active Problem List   Diagnosis Date Noted  . Dysphagia   . Protein-calorie malnutrition, severe 10/06/2019  . Orthostasis 10/01/2019  . Candida esophagitis ([place]) 10/01/2019  . Odynophagia   . Leukocytosis   . Esophagitis   . Esophageal dysphagia   . Esophageal stricture   . Respiratory failure ([place]) 09/09/2019  . COPD (chronic obstructive pulmonary disease) ([place]) 06/30/2019  . Acute on chronic respiratory failure with hypoxia and hypercapnia ([place]) 06/29/2019  . GERD (gastroesophageal reflux disease)   . Pancreatic insufficiency   . Nausea & vomiting 04/03/2019  . Abdominal pain 04/03/2019  . Abnormal finding on urinalysis 04/03/2019  . Acute respiratory failure with hypoxia and hypercapnia ([place]) 04/02/2019  . COPD with acute exacerbation  ([place]) 06/15/2018  . Essential hypertension 06/15/2018  . Genetic testing 06/01/2018  . Gastrinoma   . Family history of melanoma   . Family history of brain cancer   . Hypokalemia 10/19/2011  . Unspecified protein-calorie malnutrition ([place]) 10/19/2011  . Weight loss 10/19/2011  . Generalized weakness 10/19/2011  . Dysphagia 10/19/2011  . Anodontia 10/19/2011    Past Surgical History:  Procedure Laterality Date  . ABDOMINAL HYSTERECTOMY    . BIOPSY  09/11/2019   Procedure: BIOPSY;  Surgeon: Lavena Bullion, DO;  Location: [facility];  Service: Gastroenterology;;  . BIOPSY  10/03/2019   Procedure: BIOPSY;  Surgeon: Jerene Bears, MD;  Location: [facility];  Service: Gastroenterology;;  . BREAST LUMPECTOMY    . CESAREAN SECTION    . ESOPHAGEAL DILATION  03/13/2020   Procedure: ESOPHAGEAL DILATION;  Surgeon: [name], MD;  Location: [facility];  Service: Endoscopy;;  . ESOPHAGOGASTRODUODENOSCOPY (EGD) WITH PROPOFOL N/A 09/11/2019   Procedure: ESOPHAGOGASTRODUODENOSCOPY (EGD) WITH PROPOFOL;  Surgeon: Lavena Bullion, DO;  [facility];  Service: Gastroenterology;  Laterality: N/A;  . ESOPHAGOGASTRODUODENOSCOPY (EGD) WITH PROPOFOL N/A 10/03/2019   Procedure: ESOPHAGOGASTRODUODENOSCOPY (EGD) WITH PROPOFOL;  Surgeon: Jerene Bears, MD;  [facility];  Service: Gastroenterology;  Laterality: N/A;  . ESOPHAGOGASTRODUODENOSCOPY (EGD) WITH PROPOFOL N/A 10/19/2019   Procedure: ESOPHAGOGASTRODUODENOSCOPY (EGD) WITH PROPOFOL;  Surgeon: Yetta Flock, MD;  [facility]: [facility];  Service: Gastroenterology;  Laterality: N/A;  possible dilation  . ESOPHAGOGASTRODUODENOSCOPY (EGD) WITH PROPOFOL N/A 11/02/2019   Procedure: ESOPHAGOGASTRODUODENOSCOPY (EGD) WITH FLUORO AND  WITH PROPOFOL;  Surgeon: [name], MD;  [facility];  Service: Endoscopy;  Laterality: N/A;  need fluoro  . ESOPHAGOGASTRODUODENOSCOPY (EGD) WITH PROPOFOL N/A 11/16/2019    Procedure: ESOPHAGOGASTRODUODENOSCOPY (EGD) WITH PROPOFOL;  Surgeon: [name], MD;  [facility];  Service: Endoscopy;  Laterality: N/A;  . ESOPHAGOGASTRODUODENOSCOPY (EGD) WITH PROPOFOL N/A 11/29/2019   Procedure: ESOPHAGOGASTRODUODENOSCOPY (EGD) WITH PROPOFOL;  Surgeon: [name], MD;  [facility];  Service: Endoscopy;  Laterality: N/A;  need fluoro  . ESOPHAGOGASTRODUODENOSCOPY (EGD) WITH PROPOFOL N/A 12/21/2019   Procedure: ESOPHAGOGASTRODUODENOSCOPY (EGD) WITH PROPOFOL;  Surgeon: [name], MD;  [facility];  Service: Endoscopy;  Laterality: N/A;  . ESOPHAGOGASTRODUODENOSCOPY (EGD) WITH PROPOFOL N/A 02/07/2020   Procedure: ESOPHAGOGASTRODUODENOSCOPY (EGD) WITH PROPOFOL;  Surgeon: [name], MD;  [facility];  Service: Endoscopy;  Laterality: N/A;  . ESOPHAGOGASTRODUODENOSCOPY (EGD) WITH PROPOFOL N/A 03/13/2020   Procedure: ESOPHAGOGASTRODUODENOSCOPY (EGD) WITH PROPOFOL;  Surgeon: [name], MD;  [facility];  Service: Endoscopy;  Laterality: N/A;  With dilation  . LUNG SURGERY    . SAVORY DILATION N/A 10/19/2019   Procedure: SAVORY DILATION;  Surgeon: Yetta Flock, MD;  [facility];  Service: Gastroenterology;  Laterality: N/A;  . SAVORY DILATION N/A 11/02/2019   Procedure: SAVORY DILATION;  Surgeon: [name], MD;  [facility];  Service: Endoscopy;  Laterality: N/A;  fluoro  . SAVORY DILATION N/A 11/16/2019   Procedure: SAVORY DILATION;  Surgeon: [name], MD;  [facility];  Service: Endoscopy;  Laterality: N/A;  . SAVORY DILATION N/A 11/29/2019   Procedure: SAVORY DILATION;  Surgeon: [name], MD;  [facility];  Service: Endoscopy;  Laterality: N/A;  . SAVORY DILATION N/A 12/21/2019   Procedure: SAVORY DILATION;  Surgeon: [name], MD;  [facility];  Service: Endoscopy;  Laterality: N/A;  . SAVORY DILATION N/A 02/07/2020    Procedure: SAVORY DILATION;  Surgeon: [name], MD;  [facility];  Service: Endoscopy;  Laterality: N/A;     OB History   No obstetric history on file.     Family History  Problem Relation Age of Onset  . Diabetes Sister        x 3  . Cancer Sister 75       brain that metastized to bone, stomach and other organs  . Melanoma Maternal Aunt   . Heart attack Father   . Pneumonia Brother   . Cancer Other 17       cancer in his chest - nephew  . Colon cancer Neg Hx   . Pancreatic cancer Neg Hx   . Rectal cancer Neg Hx   . Esophageal cancer Neg Hx     Social History   Tobacco Use  . Smoking status: Former Smoker    Packs/day: 0.25    Years: 2.00    Pack years: 0.50    Types: Cigarettes    Quit date: 05/30/2019    Years since quitting: 0.8  . Smokeless tobacco: Never Used  . Tobacco comment: 2 months- quit date  Vaping Use  . Vaping Use: Never used  Substance Use Topics  . Alcohol use: No  . Drug use: No    Home Medications Prior to Admission medications   Medication Sig Start Date End Date Taking? Authorizing Provider  albuterol (PROVENTIL HFA;VENTOLIN HFA) 108 (90 Base) MCG/ACT inhaler Inhale 2 puffs into the lungs every 6 (six) hours as needed for wheezing or shortness of breath.    Yes [provider]  albuterol (PROVENTIL) (2.5 MG/3ML) 0.083% nebulizer solution Take 2.5 mg by nebulization every 6 (six) hours as needed for wheezing or shortness of breath.   Yes [provider]  budesonide-formoterol (SYMBICORT) 160-4.5 MCG/ACT inhaler Inhale 1 puff into the lungs daily. Patient taking differently: Inhale 1 puff into the lungs in the morning and at bedtime.  12/21/19   [name], MD  Calcium Carbonate Antacid (CALCIUM CARBONATE, DOSED IN MG ELEMENTAL CALCIUM,) 1250 MG/5ML SUSP Take 1,250 mg of elemental calcium by mouth daily.    [provider]  Cholecalciferol (VITAMIN D3) LIQD Take 400 Units by mouth daily.     [provider]  ENSURE (ENSURE) Take 237 mLs by mouth 3 (three) times daily between meals.     [provider]  fluticasone (FLONASE) 50 MCG/ACT nasal spray Place 1 spray into both nostrils daily as needed for allergies or rhinitis.    [provider]  mirtazapine (REMERON) 15 MG tablet Take 15 mg by mouth at bedtime. *Crushes Tablet*    [provider]  omeprazole (PRILOSEC) 40 MG capsule Take 40 mg by mouth in the morning and at bedtime.    [provider]  pantoprazole (PROTONIX) 40 MG tablet Take 1 tablet (40 mg total) by mouth 2 (two) times daily before a meal. Patient not taking: Reported on 01/26/2020 11/29/19   [name], MD  pregabalin (LYRICA) 150 MG capsule Take 150 mg by mouth at bedtime. Open capsule    [provider]  sucralfate (CARAFATE) 1 GM/10ML suspension Take 1 g by mouth in the morning, at noon, in the evening, and at bedtime.    [provider]  Tiotropium Bromide Monohydrate 2.5 MCG/ACT AERS Inhale 2 puffs into the lungs daily.     [provider]    Allergies    Azithromycin, Boniva [ibandronic acid], and Gabapentin  Review of Systems   Review of Systems  Constitutional: Positive for fatigue. Negative for chills and fever.  HENT: Negative for congestion and rhinorrhea.   Respiratory: Negative for cough and shortness of breath.   Cardiovascular: Negative for chest pain and palpitations.  Gastrointestinal: Positive for abdominal pain, diarrhea and vomiting. Negative for nausea.  Genitourinary: Negative for difficulty urinating and dysuria.  Musculoskeletal: Negative for arthralgias and back pain.  Skin: Negative for rash and wound.  Neurological: Negative for light-headedness and headaches.    Physical Exam Updated Vital Signs BP (!) 158/97   Pulse (!) 105   Temp 98.6 F (37 C) (Oral)   Resp 14   SpO2 100%   Physical Exam Vitals and nursing note reviewed. Exam conducted with a  chaperone present.  Constitutional:      General: She is not in acute distress.    Appearance: Normal appearance. She is ill-appearing.  HENT:     Head: Normocephalic and atraumatic.     Nose: No rhinorrhea.  Eyes:     General:        Right eye: No discharge.        Left eye: No discharge.     Conjunctiva/sclera: Conjunctivae normal.  Cardiovascular:     Rate and Rhythm: Normal rate and regular rhythm.  Pulmonary:     Effort: Pulmonary effort is normal. No respiratory distress.     Breath  sounds: No stridor.  Abdominal:     General: Abdomen is flat.     Tenderness: There is abdominal tenderness. There is guarding and rebound.  Musculoskeletal:        General: No tenderness or signs of injury.  Skin:    General: Skin is warm and dry.  Neurological:     General: No focal deficit present.     Mental Status: She is alert. Mental status is at baseline.     Motor: No weakness.  Psychiatric:        Mood and Affect: Mood normal.        Behavior: Behavior normal.     ED Results / Procedures / Treatments   Labs (all labs ordered are listed, but only abnormal results are displayed) Labs Reviewed  COMPREHENSIVE METABOLIC PANEL - Abnormal; Notable for the following components:      Result Value   Chloride 94 (*)    BUN 30 (*)    Anion gap 20 (*)    All other components within normal limits  CBC - Abnormal; Notable for the following components:   RBC 5.49 (*)    RDW 15.9 (*)    All other components within normal limits  URINALYSIS, ROUTINE W REFLEX MICROSCOPIC - Abnormal; Notable for the following components:   Hgb urine dipstick SMALL (*)    Ketones, ur 80 (*)    Protein, ur 30 (*)    All other components within normal limits  CULTURE, BLOOD (SINGLE)  URINE CULTURE  RESPIRATORY PANEL BY RT PCR (FLU A&B, COVID)  LACTIC ACID, PLASMA  PROTIME-INR  APTT  LACTIC ACID, PLASMA    EKG None  Radiology CT ABDOMEN PELVIS W CONTRAST  Result Date: 04/06/2020 CLINICAL DATA:   Abdominal pain EXAM: CT ABDOMEN AND PELVIS WITH CONTRAST TECHNIQUE: Multidetector CT imaging of the abdomen and pelvis was performed using the standard protocol following bolus administration of intravenous contrast. CONTRAST:  42mL OMNIPAQUE IOHEXOL 300 MG/ML  SOLN COMPARISON:  09/09/2019 FINDINGS: Lower chest: The lung bases are clear. The heart size is normal. Hepatobiliary: The liver is normal. Normal gallbladder.There is no biliary ductal dilation. Pancreas: Normal contours without ductal dilatation. No peripancreatic fluid collection. Spleen: Unremarkable. Adrenals/Urinary Tract: --Adrenal glands: Unremarkable. --Right kidney/ureter: No hydronephrosis or radiopaque kidney stones. --Left kidney/ureter: No hydronephrosis or radiopaque kidney stones. --Urinary bladder: The urinary bladder is distended. Stomach/Bowel: --Stomach/Duodenum: There is esophageal wall thickening involving the distal esophagus. The stomach is distended. The proximal duodenum is distended to the level of the SMA. --Small bowel: There is no evidence for small bowel obstruction. --Colon: Unremarkable. --Appendix: Normal. Vascular/Lymphatic: Atherosclerotic calcification is present within the non-aneurysmal abdominal aorta, without hemodynamically significant stenosis. --No retroperitoneal lymphadenopathy. --No mesenteric lymphadenopathy. --No pelvic or inguinal lymphadenopathy. Reproductive: Status post hysterectomy. No adnexal mass. Other: No ascites or free air. There has been significant interval loss of subcutaneous fat since the patient's prior CT. Musculoskeletal. No acute displaced fractures. IMPRESSION: 1. Findings suggestive of developing SMA syndrome as detailed above. 2. Distended urinary bladder. Aortic Atherosclerosis (ICD10-I70.0). Electronically Signed   By: Constance Holster M.D.   On: 04/06/2020 23:13   DG Chest Port 1 View  Result Date: 04/06/2020 CLINICAL DATA:  Shortness of breath, sepsis EXAM: PORTABLE CHEST 1 VIEW  COMPARISON:  Chest x-ray 10/03/2019 chest x-ray 10/01/2019 FINDINGS: The heart size and mediastinal contours are within normal limits. Hyperinflation of the lungs suggestive of emphysema. Pulmonary sutures overlie the left hemithorax. Biapical pleural/pulmonary scarring again noted. No focal  consolidation. No pulmonary edema. Blunting of the left costophrenic angle is again noted which may represent scarring versus trace pleural effusion. No pneumothorax. Well-defined symmetric bilateral lower/mid lung zone densities consistent with nipple shadows. No acute osseous abnormality. IMPRESSION: No active cardiopulmonary disease. Electronically Signed   By: Iven Finn M.D.   On: 04/06/2020 21:53    Procedures Procedures (including critical care time)  Medications Ordered in ED Medications  morphine 2 MG/ML injection 2 mg (has no administration in time range)  lactated ringers bolus 500 mL (0 mLs Intravenous Stopped 04/06/20 2342)  ondansetron (ZOFRAN) injection 4 mg (4 mg Intravenous Given 04/06/20 2211)  morphine 2 MG/ML injection 2 mg (2 mg Intravenous Given 04/06/20 2211)  iohexol (OMNIPAQUE) 300 MG/ML solution 75 mL (60 mLs Intravenous Contrast Given 04/06/20 2249)    ED Course  I have reviewed the triage vital signs and the nursing notes.  Pertinent labs & imaging results that were available during my care of the patient were reviewed by me and considered in my medical decision making (see chart for details).    MDM Rules/Calculators/A&P                          64 year old female with multiple chronic medical conditions comes in today with worsening abdominal pain nausea vomiting and diarrhea.  No fevers chills no sick contacts.  She gets serial esophageal dilations every 2 weeks.  Concerns with severe tenderness to palpation on exam, tachycardia active nausea vomiting.  Medications given fluids given labs reviewed no lactic acidosis no leukocytosis.  Chemistry shows no significant  derangements.  Does ketonuria proteinuria and small hemoglobin.  Anion gap but no significant acidosis.  CT scan still pending.  CT imaging reviewed by myself and radiology shows concerns for proximal small intestine obstruction, possibly due to compression from the superior mesenteric artery, potentially consistent with SMA syndrome.  I will consult with general surgery likely admit this patient for IV hydration and further treatment of the symptoms  I spoke with general surgery, they agree with NG tube placement IV hydration and further assessment in the hospital.  I will consult the medicine team for admission. Final Clinical Impression(s) / ED Diagnoses Final diagnoses:  Vomiting in adult  Diarrhea in adult patient    Rx / DC Orders ED Discharge Orders    None       Breck Coons, MD 04/06/20 [phone]

## 2020-04-07 ENCOUNTER — Inpatient Hospital Stay (HOSPITAL_COMMUNITY): Payer: No Typology Code available for payment source | Admitting: Anesthesiology

## 2020-04-07 ENCOUNTER — Encounter (HOSPITAL_COMMUNITY): Payer: Self-pay | Admitting: Internal Medicine

## 2020-04-07 ENCOUNTER — Inpatient Hospital Stay (HOSPITAL_COMMUNITY): Payer: No Typology Code available for payment source

## 2020-04-07 ENCOUNTER — Encounter (HOSPITAL_COMMUNITY)
Admission: EM | Disposition: A | Discharge status: Home Health | Payer: Self-pay | Source: Home / Self Care | Attending: Internal Medicine

## 2020-04-07 DIAGNOSIS — K551 Chronic vascular disorders of intestine: Secondary | ICD-10-CM | POA: Diagnosis present

## 2020-04-07 DIAGNOSIS — I10 Essential (primary) hypertension: Secondary | ICD-10-CM | POA: Diagnosis present

## 2020-04-07 DIAGNOSIS — J9611 Chronic respiratory failure with hypoxia: Secondary | ICD-10-CM | POA: Diagnosis present

## 2020-04-07 DIAGNOSIS — Z20822 Contact with and (suspected) exposure to covid-19: Secondary | ICD-10-CM | POA: Diagnosis present

## 2020-04-07 DIAGNOSIS — K8689 Other specified diseases of pancreas: Secondary | ICD-10-CM

## 2020-04-07 DIAGNOSIS — R109 Unspecified abdominal pain: Secondary | ICD-10-CM

## 2020-04-07 DIAGNOSIS — R112 Nausea with vomiting, unspecified: Secondary | ICD-10-CM

## 2020-04-07 DIAGNOSIS — J410 Simple chronic bronchitis: Secondary | ICD-10-CM | POA: Diagnosis not present

## 2020-04-07 DIAGNOSIS — K311 Adult hypertrophic pyloric stenosis: Secondary | ICD-10-CM | POA: Diagnosis present

## 2020-04-07 DIAGNOSIS — Z888 Allergy status to other drugs, medicaments and biological substances status: Secondary | ICD-10-CM | POA: Diagnosis not present

## 2020-04-07 DIAGNOSIS — K222 Esophageal obstruction: Secondary | ICD-10-CM | POA: Diagnosis present

## 2020-04-07 DIAGNOSIS — Z8711 Personal history of peptic ulcer disease: Secondary | ICD-10-CM | POA: Diagnosis not present

## 2020-04-07 DIAGNOSIS — K269 Duodenal ulcer, unspecified as acute or chronic, without hemorrhage or perforation: Secondary | ICD-10-CM | POA: Diagnosis present

## 2020-04-07 DIAGNOSIS — K315 Obstruction of duodenum: Secondary | ICD-10-CM | POA: Diagnosis present

## 2020-04-07 DIAGNOSIS — K219 Gastro-esophageal reflux disease without esophagitis: Secondary | ICD-10-CM | POA: Diagnosis present

## 2020-04-07 DIAGNOSIS — L299 Pruritus, unspecified: Secondary | ICD-10-CM | POA: Diagnosis present

## 2020-04-07 DIAGNOSIS — Z681 Body mass index (BMI) 19 or less, adult: Secondary | ICD-10-CM | POA: Diagnosis not present

## 2020-04-07 DIAGNOSIS — J449 Chronic obstructive pulmonary disease, unspecified: Secondary | ICD-10-CM | POA: Diagnosis present

## 2020-04-07 DIAGNOSIS — R7303 Prediabetes: Secondary | ICD-10-CM | POA: Diagnosis present

## 2020-04-07 DIAGNOSIS — R1310 Dysphagia, unspecified: Secondary | ICD-10-CM | POA: Diagnosis not present

## 2020-04-07 DIAGNOSIS — R64 Cachexia: Secondary | ICD-10-CM | POA: Diagnosis not present

## 2020-04-07 DIAGNOSIS — R1084 Generalized abdominal pain: Secondary | ICD-10-CM | POA: Diagnosis not present

## 2020-04-07 DIAGNOSIS — E43 Unspecified severe protein-calorie malnutrition: Secondary | ICD-10-CM | POA: Diagnosis present

## 2020-04-07 DIAGNOSIS — E860 Dehydration: Secondary | ICD-10-CM | POA: Diagnosis present

## 2020-04-07 DIAGNOSIS — D649 Anemia, unspecified: Secondary | ICD-10-CM | POA: Diagnosis present

## 2020-04-07 DIAGNOSIS — E162 Hypoglycemia, unspecified: Secondary | ICD-10-CM | POA: Diagnosis not present

## 2020-04-07 DIAGNOSIS — D379 Neoplasm of uncertain behavior of digestive organ, unspecified: Secondary | ICD-10-CM | POA: Diagnosis present

## 2020-04-07 DIAGNOSIS — Z85118 Personal history of other malignant neoplasm of bronchus and lung: Secondary | ICD-10-CM | POA: Diagnosis not present

## 2020-04-07 HISTORY — PX: BIOPSY: SHX5522

## 2020-04-07 HISTORY — PX: ESOPHAGOGASTRODUODENOSCOPY (EGD) WITH PROPOFOL: SHX5813

## 2020-04-07 LAB — COMPREHENSIVE METABOLIC PANEL
ALT: 12 U/L (ref 0–44)
AST: 26 U/L (ref 15–41)
Albumin: 3.9 g/dL (ref 3.5–5.0)
Alkaline Phosphatase: 64 U/L (ref 38–126)
Anion gap: 17 — ABNORMAL HIGH (ref 5–15)
BUN: 27 mg/dL — ABNORMAL HIGH (ref 8–23)
CO2: 27 mmol/L (ref 22–32)
Calcium: 8.7 mg/dL — ABNORMAL LOW (ref 8.9–10.3)
Chloride: 97 mmol/L — ABNORMAL LOW (ref 98–111)
Creatinine, Ser: 0.66 mg/dL (ref 0.44–1.00)
GFR calc Af Amer: >60 mL/min (ref >60)
GFR calc non Af Amer: >60 mL/min (ref >60)
Glucose, Bld: 105 mg/dL — ABNORMAL HIGH (ref 70–99)
Potassium: 4.3 mmol/L (ref 3.5–5.1)
Sodium: 141 mmol/L (ref 135–145)
Total Bilirubin: 0.9 mg/dL (ref 0.3–1.2)
Total Protein: 6.7 g/dL (ref 6.5–8.1)

## 2020-04-07 LAB — CBC
HCT: 42.6 % (ref 36.0–46.0)
Hemoglobin: 13.1 g/dL (ref 12.0–15.0)
MCH: 25.7 pg — ABNORMAL LOW (ref 26.0–34.0)
MCHC: 30.8 g/dL (ref 30.0–36.0)
MCV: 83.7 fL (ref 80.0–100.0)
Platelets: 275 10*3/uL (ref 150–400)
RBC: 5.09 MIL/uL (ref 3.87–5.11)
RDW: 15.9 % — ABNORMAL HIGH (ref 11.5–15.5)
WBC: 12.7 10*3/uL — ABNORMAL HIGH (ref 4.0–10.5)
nRBC: 0 % (ref 0.0–0.2)

## 2020-04-07 LAB — CREATININE, SERUM
Creatinine, Ser: 0.66 mg/dL (ref 0.44–1.00)
GFR calc Af Amer: >60 mL/min (ref >60)
GFR calc non Af Amer: >60 mL/min (ref >60)

## 2020-04-07 LAB — LACTIC ACID, PLASMA: Lactic Acid, Venous: 1.3 mmol/L (ref 0.5–1.9)

## 2020-04-07 LAB — RESPIRATORY PANEL BY RT PCR (FLU A&B, COVID)
Influenza A by PCR: NEGATIVE
Influenza B by PCR: NEGATIVE
SARS Coronavirus 2 by RT PCR: NEGATIVE

## 2020-04-07 SURGERY — ESOPHAGOGASTRODUODENOSCOPY (EGD) WITH PROPOFOL
Anesthesia: Monitor Anesthesia Care

## 2020-04-07 MED ORDER — ONDANSETRON HCL 4 MG PO TABS: 4.0000 mg | ORAL_TABLET | Freq: Four times a day (QID) | ORAL | Status: DC | PRN

## 2020-04-07 MED ORDER — FENTANYL CITRATE (PF) 100 MCG/2ML IJ SOLN
25.0000 ug | Freq: Once | INTRAMUSCULAR | Status: AC
  Administered 2020-04-07: 25 ug via INTRAVENOUS

## 2020-04-07 MED ORDER — FENTANYL CITRATE (PF) 100 MCG/2ML IJ SOLN
25.0000 ug | INTRAMUSCULAR | Status: DC | PRN
  Administered 2020-04-07 (×2): 25 ug via INTRAVENOUS

## 2020-04-07 MED ORDER — PROPOFOL 500 MG/50ML IV EMUL
INTRAVENOUS | Status: DC | PRN
  Administered 2020-04-07: 100 ug/kg/min via INTRAVENOUS

## 2020-04-07 MED ORDER — PROPOFOL 500 MG/50ML IV EMUL
INTRAVENOUS | Status: AC
  Filled 2020-04-07: qty 50

## 2020-04-07 MED ORDER — PROPOFOL 10 MG/ML IV BOLUS
INTRAVENOUS | Status: DC | PRN
  Administered 2020-04-07 (×4): 20 mg via INTRAVENOUS

## 2020-04-07 MED ORDER — MORPHINE SULFATE (PF) 2 MG/ML IV SOLN
2.0000 mg | INTRAVENOUS | Status: DC | PRN
  Administered 2020-04-07 – 2020-04-08 (×8): 2 mg via INTRAVENOUS
  Filled 2020-04-07 (×8): qty 1

## 2020-04-07 MED ORDER — FENTANYL CITRATE (PF) 100 MCG/2ML IJ SOLN
12.5000 ug | INTRAMUSCULAR | Status: DC | PRN
  Administered 2020-04-07 (×2): 25 ug via INTRAVENOUS
  Filled 2020-04-07 (×3): qty 2

## 2020-04-07 MED ORDER — ONDANSETRON HCL 4 MG/2ML IJ SOLN
4.0000 mg | Freq: Four times a day (QID) | INTRAMUSCULAR | Status: DC | PRN
  Administered 2020-04-07 – 2020-04-18 (×5): 4 mg via INTRAVENOUS
  Filled 2020-04-07 (×5): qty 2

## 2020-04-07 MED ORDER — PANTOPRAZOLE SODIUM 40 MG IV SOLR
40.0000 mg | Freq: Two times a day (BID) | INTRAVENOUS | Status: DC
  Administered 2020-04-07 – 2020-04-13 (×13): 40 mg via INTRAVENOUS
  Filled 2020-04-07 (×11): qty 40

## 2020-04-07 MED ORDER — HEPARIN SODIUM (PORCINE) 5000 UNIT/ML IJ SOLN
5000.0000 [IU] | Freq: Three times a day (TID) | INTRAMUSCULAR | Status: DC
  Administered 2020-04-07 – 2020-04-12 (×15): 5000 [IU] via SUBCUTANEOUS
  Filled 2020-04-07 (×15): qty 1

## 2020-04-07 MED ORDER — SUCRALFATE 1 GM/10ML PO SUSP: 1.0000 g | Freq: Three times a day (TID) | ORAL | Status: DC

## 2020-04-07 MED ORDER — LACTATED RINGERS IV SOLN: INTRAVENOUS | Status: DC

## 2020-04-07 SURGICAL SUPPLY — 15 items

## 2020-04-07 NOTE — Progress Notes (Signed)
Initial Nutrition Assessment  DOCUMENTATION CODES:   Underweight, Severe malnutrition in context of chronic illness  INTERVENTION:   If diet advanced: -Ensure Enlive po TID, each supplement provides 350 kcal and 20 grams of protein  Given ongoing weight loss and severe malnutrition, recommend consideration of  feeding tube as it seems pt is unable to meet her needs through PO consistently. If enteral feeds not feasible at this time, consider TPN.   NUTRITION DIAGNOSIS:   Severe Malnutrition related to chronic illness (COPD, recurrent esophageal strictures) as evidenced by percent weight loss, energy intake < or equal to 75% for > or equal to 1 month, severe fat depletion, severe muscle depletion.  GOAL:   Patient will meet greater than or equal to 90% of their needs  MONITOR:   Labs, Weight trends, I & O's  REASON FOR ASSESSMENT:   Malnutrition Screening Tool    ASSESSMENT:   64 y.o. female, known to Dr. Fuller Plan, who was readmitted last evening after presenting to the emergency room with 3 to 4-day history of nausea and vomiting inability to keep down p.o.'s or meds.  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  She was told this was a Whipple procedure but no evidence of Whipple by more recent EGDs.  She also has history of lung cancer for which she underwent a left upper lobectomy in 2014 2015 also in [place].  Patient in room in obvious pain and discomfort. Unable to provide much history, states she was waiting on her pain/nausea medication. Pt reports she has not been eating well. Pt has had recurrent esophageal strictures which require dilation. Last dilation was 8/30.  Pt has been having N/V and abdominal pain for a few days PTA.  Pt does drink Boost at home when she is able to tolerate intakes.   Pt with history of receiving TPN in an admission in March 2021 for acute esophageal necrosis.   Per weight records, pt has lost 20 lbs since December 2020 (22% wt loss x 9 months, significant for time frame).   Given severe malnutrition and continued weight loss, pt would benefit from nutrition support if within [place] and feasible. Noted that GI has planned EGD/enteroscopy today.   Labs reviewed. Medications: Lactated ringers, IV Zofran  NUTRITION - FOCUSED PHYSICAL EXAM:    Most Recent Value  Orbital Region Severe depletion  Upper Arm Region Severe depletion  Thoracic and Lumbar Region Unable to assess  Buccal Region --  [masked]  Temple Region Severe depletion  Clavicle Bone Region Severe depletion  Clavicle and Acromion Bone Region Severe depletion  Scapular Bone Region Severe depletion  Dorsal Hand Severe depletion  Patellar Region Unable to assess  Anterior Thigh Region Unable to assess  Posterior Calf Region Unable to assess  Edema (RD Assessment) None       Diet Order:   Diet Order            Diet NPO time specified  Diet effective now                 EDUCATION NEEDS:   Not appropriate for education at this time  Skin:  Skin Assessment: Reviewed RN Assessment  Last BM:  9/23  Height:   Ht Readings from Last 1 Encounters:  04/07/20  4\' 11"  (1.499 m)    Weight:   Wt Readings from Last 1 Encounters:  04/07/20 30.7 kg    BMI:  Body mass index is 13.67 kg/m.  Estimated Nutritional Needs:   Kcal:  1250-1450  Protein:  60-75g  Fluid:  1.5L/day   Clayton Bibles, MS, RD, LDN Inpatient Clinical Dietitian Contact information available via Amion

## 2020-04-07 NOTE — Progress Notes (Signed)
PROGRESS NOTE   Claudia Wiley  UVO:[id number]    DOB: 1956-04-21    DOA: 04/06/2020  PCP: [facility]   I have briefly reviewed patients previous medical records in [facility].  Chief Complaint  Patient presents with  . Dehydration  . 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  General surgery and [facility] consulting.   Assessment & Plan:  Active Problems:   Essential hypertension   Nausea & vomiting   Abdominal pain   GERD (gastroesophageal reflux disease)   Pancreatic insufficiency   COPD (chronic obstructive pulmonary disease) (HCC)   Esophageal stricture   Protein-calorie malnutrition, severe   SMAS (superior mesenteric artery syndrome) (HCC)   Acute onset of abdominal pain, nausea, vomiting, transient diarrhea and poor oral intake: Reportedly started approximately 48 hours after initial Covid vaccine.  Initial CT abdomen showed distal esophageal wall thickening, dilated stomach and dilated proximal duodenum to the level of the SMA.  Lactate normal.  Discussed with GI and they plan EGD to evaluate both esophageal stricture and small bowel anatomy, push enteroscopy and possible NG tube placement to decompress stomach and feeding purposes.  The indicated that she could have superimposed SMA syndrome due to significant weight loss.  Treatment of SMA syndrome would be  to improve calorie intake and restore fat pad in between SMA and duodenum.  General surgery input appreciated.  No surgical interventions at this time.  Patient may need feeding tube.  Both GI and general surgery indicate that even though there are reports of her having Whipple's procedure, imaging studies and multiple endoscopies are not consistent with post Whipple anatomy.  Per outside records obtained by general surgery, op report from 02/11/2014 confirms exploratory laparotomy, lysis of adhesions, Kocher maneuver and resection of peripancreatic gastrinoma.  Continue supportive treatment with IV PPI twice daily, antiemetics, and analgesics.  Dysphagia/recurrent esophageal strictures: Requiring multiple EGDs and dilatations.  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  COPD: No clinical bronchospasm.  Essential hypertension: Mildly uncontrolled at times.  This may be due to her ongoing pain issues.  Body mass index is 13.67 kg/m.  Nutritional Status Nutrition Problem: Severe Malnutrition Etiology: chronic illness (COPD, recurrent esophageal strictures) Signs/Symptoms: percent weight loss, energy intake < or equal to 75% for > or equal to 1 month, severe fat depletion, severe muscle depletion Percent weight loss: 22 % (x 9 months) Interventions: Refer to RD note for recommendations  DVT prophylaxis: heparin injection 5,000 Units Start: 04/07/20 0600     Code Status: Full Code  Family Communication:  Disposition:  Status is: Inpatient  Remains inpatient appropriate because:Inpatient level of care appropriate due to severity of illness   Dispo: The patient is from: Home              Anticipated d/c is to: Home  Anticipated d/c date is: > 3 days              Patient currently is not medically stable to d/c.        Consultants:     Procedures:     Antimicrobials:    Anti-infectives (From admission, onward)    None        Subjective:  Patient seen this morning prior to procedures.  Noted to be in severe pain-mostly abdomen and lower mid chest.  Fentanyl IV 25 mcg x 2 doses without much help and changed to IV morphine as needed.  Reported that she had a BM last night.  Objective:   Vitals:   04/07/20 0647 04/07/20 1000 04/07/20 1350 04/07/20 1539  BP: (!) 142/91 (!) 139/96 (!) 154/89 (!) 130/92  Pulse: 97 (!) 108 (!) 107 (!) 105  Resp:  14 14 12   Temp:  98.3 F (36.8 C) 98.4 F (36.9 C) 98.4 F (36.9 C)  TempSrc:  Oral Oral Oral  SpO2:  99% 100% 97%  Weight:      Height:        General exam: Middle-age female, small built, frail and chronically ill looking, lying uncomfortably in bed with ongoing pain. Respiratory system: Clear to auscultation. Respiratory effort normal. Cardiovascular system: S1 & S2 heard, RRR. No JVD, murmurs, rubs, gallops or clicks. No pedal edema.  Telemetry personally reviewed: Sinus rhythm.  Occasional sinus tachycardia, mild Gastrointestinal system: Abdomen is nondistended, diffuse tenderness with voluntary guarding but no rigidity, rebound. No organomegaly or masses felt. Normal bowel sounds heard. Central nervous system: Alert and oriented. No focal neurological deficits. Extremities: Symmetric 5 x 5 power. Skin: No rashes, lesions or ulcers Psychiatry: Judgement and insight appear normal. Mood & affect unable to assess due to ongoing acute pain.     Data Reviewed:   I have personally reviewed following labs and imaging studies   CBC: Recent Labs  Lab 04/06/20 1859 04/07/20 0230  WBC 10.0 12.7*  HGB 14.4 13.1  HCT 45.3 42.6  MCV 82.5 83.7  PLT 320 798    Basic Metabolic Panel: Recent Labs  Lab 04/06/20 1859 04/07/20 0002 04/07/20 0230  NA 141  --  141  K 4.4  --  4.3  CL 94*  --  97*  CO2 27  --  27  GLUCOSE 85  --  105*  BUN 30*  --  27*  CREATININE 0.79 0.66 0.66  CALCIUM 9.3  --  8.7*    Liver Function Tests: Recent Labs   Lab 04/06/20 1859 04/07/20 0230  AST 29 26  ALT 13 12  ALKPHOS 73 64  BILITOT 1.1 0.9  PROT 7.4 6.7  ALBUMIN 4.5 3.9    CBG: No results for input(s): GLUCAP in the last 168 hours.  Microbiology Studies:   Recent Results (from the past 240 hour(s))  Respiratory Panel by RT PCR (Flu A&B, Covid) - Nasopharyngeal Swab     Status: None   Collection Time: 04/06/20 10:26 PM   Specimen: Nasopharyngeal Swab  Result Value Ref Range Status   SARS Coronavirus 2 by RT PCR NEGATIVE NEGATIVE Final    Comment: (NOTE) SARS-CoV-2 target nucleic acids are NOT DETECTED.  The SARS-CoV-2 RNA is generally detectable in upper respiratoy specimens during the acute phase of infection. The lowest concentration of SARS-CoV-2 viral copies this assay can detect is 131 copies/mL. A negative result does not preclude SARS-Cov-2 infection and should not be used as the sole basis  for treatment or other patient management decisions. A negative result may occur with  improper specimen collection/handling, submission of specimen other than nasopharyngeal swab, presence of viral mutation(s) within the areas targeted by this assay, and inadequate number of viral copies (<131 copies/mL). A negative result must be combined with clinical observations, patient history, and epidemiological information. The expected result is Negative.  Fact Sheet for Patients:  PinkCheek.be  Fact Sheet for Healthcare Providers:  GravelBags.it  This test is no t yet approved or cleared by the [place] FDA and  has been authorized for detection and/or diagnosis of SARS-CoV-2 by FDA under an Emergency Use Authorization (EUA). This EUA will remain  in effect (meaning this test can be used) for the duration of the COVID-19 declaration under Section 564(b)(1) of the Act, 21 U.S.C. section 360bbb-3(b)(1), unless the authorization is terminated or revoked sooner.      Influenza A by PCR NEGATIVE NEGATIVE Final   Influenza B by PCR NEGATIVE NEGATIVE Final    Comment: (NOTE) The Xpert Xpress SARS-CoV-2/FLU/RSV assay is intended as an aid in  the diagnosis of influenza from Nasopharyngeal swab specimens and  should not be used as a sole basis for treatment. Nasal washings and  aspirates are unacceptable for Xpert Xpress SARS-CoV-2/FLU/RSV  testing.  Fact Sheet for Patients: PinkCheek.be  Fact Sheet for Healthcare Providers: GravelBags.it  This test is not yet approved or cleared by the [place] FDA and  has been authorized for detection and/or diagnosis of SARS-CoV-2 by  FDA under an Emergency Use Authorization (EUA). This EUA will remain  in effect (meaning this test can be used) for the duration of the  Covid-19 declaration under Section 564(b)(1) of the Act, 21  U.S.C. section 360bbb-3(b)(1), unless the authorization is  terminated or revoked. Performed at [facility], [place] [address], [place], [place] [address]      Radiology Studies:  CT ABDOMEN PELVIS W CONTRAST  Result Date: 04/06/2020 CLINICAL DATA:  Abdominal pain EXAM: CT ABDOMEN AND PELVIS WITH CONTRAST TECHNIQUE: Multidetector CT imaging of the abdomen and pelvis was performed using the standard protocol following bolus administration of intravenous contrast. CONTRAST:  29mL OMNIPAQUE IOHEXOL 300 MG/ML  SOLN COMPARISON:  09/09/2019 FINDINGS: Lower chest: The lung bases are clear. The heart size is normal. Hepatobiliary: The liver is normal. Normal gallbladder.There is no biliary ductal dilation. Pancreas: Normal contours without ductal dilatation. No peripancreatic fluid collection. Spleen: Unremarkable. Adrenals/Urinary Tract: --Adrenal glands: Unremarkable. --Right kidney/ureter: No hydronephrosis or radiopaque kidney stones. --Left kidney/ureter: No hydronephrosis or radiopaque kidney stones. --Urinary  bladder: The urinary bladder is distended. Stomach/Bowel: --Stomach/Duodenum: There is esophageal wall thickening involving the distal esophagus. The stomach is distended. The proximal duodenum is distended to the level of the SMA. --Small bowel: There is no evidence for small bowel obstruction. --Colon: Unremarkable. --Appendix: Normal. Vascular/Lymphatic: Atherosclerotic calcification is present within the non-aneurysmal abdominal aorta, without hemodynamically significant stenosis. --No retroperitoneal lymphadenopathy. --No mesenteric lymphadenopathy. --No pelvic or inguinal lymphadenopathy. Reproductive: Status post hysterectomy. No adnexal mass. Other: No ascites or free air. There has been significant interval loss of subcutaneous fat since the patient's prior CT. Musculoskeletal. No acute displaced fractures. IMPRESSION: 1. Findings suggestive of developing SMA syndrome as detailed above. 2. Distended urinary bladder. Aortic Atherosclerosis (ICD10-I70.0). Electronically Signed   By: Constance Holster M.D.   On: 04/06/2020 23:13   DG Chest Port 1 View  Result Date: 04/06/2020 CLINICAL DATA:  Shortness of breath, sepsis EXAM: PORTABLE CHEST 1 VIEW  COMPARISON:  Chest x-ray 10/03/2019 chest x-ray 10/01/2019 FINDINGS: The heart size and mediastinal contours are within normal limits. Hyperinflation of the lungs suggestive of emphysema. Pulmonary sutures overlie the left hemithorax. Biapical pleural/pulmonary scarring again noted. No focal consolidation. No pulmonary edema. Blunting of the left costophrenic angle is again noted which may represent scarring versus trace pleural effusion. No pneumothorax. Well-defined symmetric bilateral lower/mid lung zone densities consistent with nipple shadows. No acute osseous abnormality. IMPRESSION: No active cardiopulmonary disease. Electronically Signed   By: Iven Finn M.D.   On: 04/06/2020 21:53     Scheduled Meds:   . [MAR Hold] heparin  5,000 Units  Subcutaneous Q8H  . [MAR Hold] pantoprazole (PROTONIX) IV  40 mg Intravenous Q12H    Continuous Infusions:   . lactated ringers 75 mL/hr at 04/07/20 0300     LOS: 0 days     Vernell Leep, MD, [place], [facility]. [facility]    To contact the attending provider between 7A-7P or the covering provider during after hours 7P-7A, please log into the web site [email or web address] and access using universal Central password for that web site. If you do not have the password, please call the hospital operator.  04/07/2020, 5:23 PM

## 2020-04-07 NOTE — H&P (Signed)
Claudia Wiley is an 64 y.o. female.   Chief Complaint: Abdominal pain, fatigue, vomiting and diarrhea HPI: The patient is a a chronically ill 64 yr old woman. She carries a past medical history significant for Anemia, gastric and lung cancer, esophageal stricture, gastrinoma, GERD, hypertension, pancreatic insufficiency, and unintentional weight loss. She is patient of Dr. Lynne Leader and undergoes dilatation of her esophagus every 2-4 weeks. The patient states that she got her COVID shot last Monday. Afterwards she states that she just didn't feel well. By the next day she had nausea, vomiting, and diarrhea. She states that she has not been able to eat much at all since then. She states that her home health nurse and her sister finally convinced her to come to the ED.   In the ED the patient was found to have a normal temperature, normal respiratory rate, and oxygen saturation of 100% on 2 liters. Her blood pressures have been on the high side. Heart rates have been high.   Laboratory evaluation revealed a normal chemistry and unremarkable liver function tests. CBC is also normal with a WBC of 14.4 and WBC of 10.0. 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 Dr. Ron Parker discussed the patient with general surgery. They have recommended an NGT and hydration. They will follow along.   [facility] hospitalists have been consulted to admit the patient for further evaluation and treatment.  Past Medical History:  Diagnosis Date  . Anemia   . Arthritis   . Cancer [facility]) 2013   Upper left lung and stomach cancer  . COPD (chronic obstructive pulmonary disease) ([place])   . Esophageal stricture   . Family history of brain cancer   . Family history of melanoma   . Gastrinoma   . Gastrinoma   . GERD (gastroesophageal reflux disease)   . Hypertension   . Osteoporosis   . Pancreatic insufficiency   . Pneumonia   . PONV (postoperative nausea and vomiting)   . Vitamin D deficiency   . Weight  loss, unintentional     Past Surgical History:  Procedure Laterality Date  . ABDOMINAL HYSTERECTOMY    . BIOPSY  09/11/2019   Procedure: BIOPSY;  Surgeon: Lavena Bullion, DO;  Location: [facility];  Service: Gastroenterology;;  . BIOPSY  10/03/2019   Procedure: BIOPSY;  Surgeon: Jerene Bears, MD;  Location: [facility];  Service: Gastroenterology;;  . BREAST LUMPECTOMY    . CESAREAN SECTION    . ESOPHAGEAL DILATION  03/13/2020   Procedure: ESOPHAGEAL DILATION;  Surgeon: [name], MD;  Location: [facility];  Service: Endoscopy;;  . ESOPHAGOGASTRODUODENOSCOPY (EGD) WITH PROPOFOL N/A 09/11/2019   Procedure: ESOPHAGOGASTRODUODENOSCOPY (EGD) WITH PROPOFOL;  Surgeon: Lavena Bullion, DO;  [facility];  Service: Gastroenterology;  Laterality: N/A;  . ESOPHAGOGASTRODUODENOSCOPY (EGD) WITH PROPOFOL N/A 10/03/2019   Procedure: ESOPHAGOGASTRODUODENOSCOPY (EGD) WITH PROPOFOL;  Surgeon: Jerene Bears, MD;  [facility];  Service: Gastroenterology;  Laterality: N/A;  . ESOPHAGOGASTRODUODENOSCOPY (EGD) WITH PROPOFOL N/A 10/19/2019   Procedure: ESOPHAGOGASTRODUODENOSCOPY (EGD) WITH PROPOFOL;  Surgeon: Yetta Flock, MD;  [facility];  Service: Gastroenterology;  Laterality: N/A;  possible dilation  . ESOPHAGOGASTRODUODENOSCOPY (EGD) WITH PROPOFOL N/A 11/02/2019   Procedure: ESOPHAGOGASTRODUODENOSCOPY (EGD) WITH FLUORO AND  WITH PROPOFOL;  Surgeon: [name], MD;  [facility];  Service: Endoscopy;  Laterality: N/A;  need fluoro  . ESOPHAGOGASTRODUODENOSCOPY (EGD) WITH PROPOFOL N/A 11/16/2019   Procedure: ESOPHAGOGASTRODUODENOSCOPY (EGD) WITH PROPOFOL;  Surgeon: Fuller Plan,  Pricilla Riffle, MD;  [facility];  Service: Endoscopy;  Laterality: N/A;  . ESOPHAGOGASTRODUODENOSCOPY (EGD) WITH PROPOFOL N/A 11/29/2019   Procedure: ESOPHAGOGASTRODUODENOSCOPY (EGD) WITH PROPOFOL;  Surgeon: [name], MD;  [facility];  Service: Endoscopy;   Laterality: N/A;  need fluoro  . ESOPHAGOGASTRODUODENOSCOPY (EGD) WITH PROPOFOL N/A 12/21/2019   Procedure: ESOPHAGOGASTRODUODENOSCOPY (EGD) WITH PROPOFOL;  Surgeon: [name], MD;  [facility];  Service: Endoscopy;  Laterality: N/A;  . ESOPHAGOGASTRODUODENOSCOPY (EGD) WITH PROPOFOL N/A 02/07/2020   Procedure: ESOPHAGOGASTRODUODENOSCOPY (EGD) WITH PROPOFOL;  Surgeon: [name], MD;  [facility];  Service: Endoscopy;  Laterality: N/A;  . ESOPHAGOGASTRODUODENOSCOPY (EGD) WITH PROPOFOL N/A 03/13/2020   Procedure: ESOPHAGOGASTRODUODENOSCOPY (EGD) WITH PROPOFOL;  Surgeon: [name], MD;  [facility];  Service: Endoscopy;  Laterality: N/A;  With dilation  . LUNG SURGERY    . SAVORY DILATION N/A 10/19/2019   Procedure: SAVORY DILATION;  Surgeon: Yetta Flock, MD;  [facility];  Service: Gastroenterology;  Laterality: N/A;  . SAVORY DILATION N/A 11/02/2019   Procedure: SAVORY DILATION;  Surgeon: [name], MD;  [facility];  Service: Endoscopy;  Laterality: N/A;  fluoro  . SAVORY DILATION N/A 11/16/2019   Procedure: SAVORY DILATION;  Surgeon: [name], MD;  [facility];  Service: Endoscopy;  Laterality: N/A;  . SAVORY DILATION N/A 11/29/2019   Procedure: SAVORY DILATION;  Surgeon: [name], MD;  [facility];  Service: Endoscopy;  Laterality: N/A;  . SAVORY DILATION N/A 12/21/2019   Procedure: SAVORY DILATION;  Surgeon: [name], MD;  [facility];  Service: Endoscopy;  Laterality: N/A;  . SAVORY DILATION N/A 02/07/2020   Procedure: SAVORY DILATION;  Surgeon: [name], MD;  [facility];  Service: Endoscopy;  Laterality: N/A;    Family History  Problem Relation Age of Onset  . Diabetes Sister        x 3  . Cancer Sister 71       brain that metastized to bone, stomach and other organs  . Melanoma Maternal Aunt   . Heart attack Father   . Pneumonia  Brother   . Cancer Other 17       cancer in his chest - nephew  . Colon cancer Neg Hx   . Pancreatic cancer Neg Hx   . Rectal cancer Neg Hx   . Esophageal cancer Neg Hx    Social History:  reports that she quit smoking about 10 months ago. Her smoking use included cigarettes. She has a 0.50 pack-year smoking history. She has never used smokeless tobacco. She reports that she does not drink alcohol and does not use drugs. (Not in a hospital admission)   Allergies:  Allergies  Allergen Reactions  . Azithromycin Other (See Comments)    Unknown   . Boniva [Ibandronic Acid] Other (See Comments)    unknown  . Gabapentin Other (See Comments)    unknown    Pertinent items noted in HPI and remainder of comprehensive ROS otherwise negative.   General appearance: alert, cooperative, cachectic and mild distress Head: Normocephalic, without obvious abnormality, atraumatic Eyes: conjunctivae/corneas clear. PERRL, EOM's intact. Fundi benign. Throat: lips, mucosa, and tongue normal; teeth and gums normal Neck: no adenopathy, no carotid bruit, no JVD, supple, symmetrical, trachea midline and thyroid not enlarged, symmetric, no tenderness/mass/nodules Resp: No increased work of breathing. No wheezes, rales, or rhonchi. No tactile fremitus. Chest wall: no tenderness Cardio: regular  rate and rhythm, S1, S2 normal, no murmur, click, rub or gallop GI: Soft, scaffoid, diffusely tender, hypoactive bowel sounds, no hernias, masses, or organomegaly identified. Extremities: extremities normal, atraumatic, no cyanosis or edema Pulses: 2+ and symmetric Skin: Skin color, texture, turgor normal. No rashes or lesions Lymph nodes: Cervical, supraclavicular, and axillary nodes normal. Neurologic: Alert and oriented X 3, normal strength and tone. Normal symmetric reflexes. Normal coordination and gait   Results for orders placed or performed during the hospital encounter of 04/06/20 (from the past 48 hour(s))   Urinalysis, Routine w reflex microscopic In/Out Cath Urine     Status: Abnormal   Collection Time: 04/06/20  6:30 PM  Result Value Ref Range   Color, Urine YELLOW YELLOW   APPearance CLEAR CLEAR   Specific Gravity, Urine 1.027 1.005 - 1.030   pH 5.0 5.0 - 8.0   Glucose, UA NEGATIVE NEGATIVE mg/dL   Hgb urine dipstick SMALL (A) NEGATIVE   Bilirubin Urine NEGATIVE NEGATIVE   Ketones, ur 80 (A) NEGATIVE mg/dL   Protein, ur 30 (A) NEGATIVE mg/dL   Nitrite NEGATIVE NEGATIVE   Leukocytes,Ua NEGATIVE NEGATIVE   RBC / HPF 0-5 0 - 5 RBC/hpf   WBC, UA 0-5 0 - 5 WBC/hpf   Bacteria, UA NONE SEEN NONE SEEN   Squamous Epithelial / LPF 0-5 0 - 5   Mucus PRESENT    Hyaline Casts, UA PRESENT     Comment: Performed at [facility], [place] [address], [place], [place] [address]  Comprehensive metabolic panel     Status: Abnormal   Collection Time: 04/06/20  6:59 PM  Result Value Ref Range   Sodium 141 135 - 145 mmol/L   Potassium 4.4 3.5 - 5.1 mmol/L   Chloride 94 (L) 98 - 111 mmol/L   CO2 27 22 - 32 mmol/L   Glucose, Bld 85 70 - 99 mg/dL    Comment: Glucose reference range applies only to samples taken after fasting for at least 8 hours.   BUN 30 (H) 8 - 23 mg/dL   Creatinine, Ser 0.79 0.44 - 1.00 mg/dL   Calcium 9.3 8.9 - 10.3 mg/dL   Total Protein 7.4 6.5 - 8.1 g/dL   Albumin 4.5 3.5 - 5.0 g/dL   AST 29 15 - 41 U/L   ALT 13 0 - 44 U/L   Alkaline Phosphatase 73 38 - 126 U/L   Total Bilirubin 1.1 0.3 - 1.2 mg/dL   GFR calc non Af Amer >60 >60 mL/min   GFR calc Af Amer >60 >60 mL/min   Anion gap 20 (H) 5 - 15    Comment: Performed at [facility], [place] [address], [place],  [address]  CBC     Status: Abnormal   Collection Time: 04/06/20  6:59 PM  Result Value Ref Range   WBC 10.0 4.0 - 10.5 K/uL   RBC 5.49 (H) 3.87 - 5.11 MIL/uL   Hemoglobin 14.4 12.0 - 15.0 g/dL   HCT 45.3 36 - 46 %   MCV 82.5 80.0 - 100.0 fL   MCH 26.2 26.0 - 34.0 pg    MCHC 31.8 30.0 - 36.0 g/dL   RDW 15.9 (H) 11.5 - 15.5 %   Platelets 320 150 - 400 K/uL   nRBC 0.0 0.0 - 0.2 %    Comment: Performed at [facility], [place] [address] [place], [place] [address]  Lactic acid, plasma     Status: None  Collection Time: 04/06/20  9:30 PM  Result Value Ref Range   Lactic Acid, Venous 1.6 0.5 - 1.9 mmol/L    Comment: Performed at [facility], [place] [address], [place], [place] [address]  Protime-INR     Status: None   Collection Time: 04/06/20  9:30 PM  Result Value Ref Range   Prothrombin Time 13.4 11.4 - 15.2 seconds   INR 1.1 0.8 - 1.2    Comment: (NOTE) INR goal varies based on device and disease states. Performed at [facility], [place] [address], [place], [place] [address]   APTT     Status: None   Collection Time: 04/06/20  9:30 PM  Result Value Ref Range   aPTT 33 24 - 36 seconds    Comment: Performed at [facility], [place] [address] [place], [place] [address]  Respiratory Panel by RT PCR (Flu A&B, Covid) - Nasopharyngeal Swab     Status: None   Collection Time: 04/06/20 10:26 PM   Specimen: Nasopharyngeal Swab  Result Value Ref Range   SARS Coronavirus 2 by RT PCR NEGATIVE NEGATIVE    Comment: (NOTE) SARS-CoV-2 target nucleic acids are NOT DETECTED.  The SARS-CoV-2 RNA is generally detectable in upper respiratoy specimens during the acute phase of infection. The lowest concentration of SARS-CoV-2 viral copies this assay can detect is 131 copies/mL. A negative result does not preclude SARS-Cov-2 infection and should not be used as the sole basis for treatment or other patient management decisions. A negative result may occur with  improper specimen collection/handling, submission of specimen other than nasopharyngeal swab, presence of viral mutation(s) within the areas targeted by this assay, and inadequate number of viral copies (<131 copies/mL). A negative result  must be combined with clinical observations, patient history, and epidemiological information. The expected result is Negative.  Fact Sheet for Patients:  PinkCheek.be  Fact Sheet for Healthcare Providers:  GravelBags.it  This test is no t yet approved or cleared by the [place] FDA and  has been authorized for detection and/or diagnosis of SARS-CoV-2 by FDA under an Emergency Use Authorization (EUA). This EUA will remain  in effect (meaning this test can be used) for the duration of the COVID-19 declaration under Section 564(b)(1) of the Act, 21 U.S.C. section 360bbb-3(b)(1), unless the authorization is terminated or revoked sooner.     Influenza A by PCR NEGATIVE NEGATIVE   Influenza B by PCR NEGATIVE NEGATIVE    Comment: (NOTE) The Xpert Xpress SARS-CoV-2/FLU/RSV assay is intended as an aid in  the diagnosis of influenza from Nasopharyngeal swab specimens and  should not be used as a sole basis for treatment. Nasal washings and  aspirates are unacceptable for Xpert Xpress SARS-CoV-2/FLU/RSV  testing.  Fact Sheet for Patients: PinkCheek.be  Fact Sheet for Healthcare Providers: GravelBags.it  This test is not yet approved or cleared by the [place] FDA and  has been authorized for detection and/or diagnosis of SARS-CoV-2 by  FDA under an Emergency Use Authorization (EUA). This EUA will remain  in effect (meaning this test can be used) for the duration of the  Covid-19 declaration under Section 564(b)(1) of the Act, 21  U.S.C. section 360bbb-3(b)(1), unless the authorization is  terminated or revoked. Performed at [facility], [place] [address] [place] [address] [place], [place] [address]    @[email or web address] @  Blood pressure (!) 161/147, pulse (!) 116, temperature 98.6 F (37 C), temperature source Oral, resp. rate 18, SpO2 100 %.  Assessment/Plan Problem  Smas (Superior Mesenteric Artery Syndrome) ([facility])  Protein-calorie malnutrition, severe  Esophageal Dysphagia  Esophageal Stricture  Copd (Chronic Obstructive Pulmonary Disease) ([facility])  Gerd (Gastroesophageal Reflux Disease)  Pancreatic Insufficiency  Nausea & Vomiting  Abdominal Pain  Essential Hypertension  Unspecified protein-calorie malnutrition ([facility])   Abdominal pain, nausea, vomiting, and diarrhea: NGT, hydration, and pain control. Due to SMA syndrome.  Superior mesenteric artery syndrome: CT abdomen demonstrated compression of the duodenum. This is felt to be the reason for her recent nausea, vomiting, and diarrhea. General surgery was contacted by Dr. Ron Parker. General surgery will consult, but they recommend placement of NGT and hydration. The patient will also have pain control and antiemetics.   Esophageal Dysphagia and Stricture/GERD: The patient sees Dr. Fuller Plan for her esophageal issues and undergoes esophageal dilatation every 2 weeks. The patient says that Fuller Plan was planning to start to stretch this out to every 4 weeks. Continue PPI and carafate.  Pancreatic insufficiency; Supplement  Protein calorie malnutrition: Due to chronic illness and GI issues. Consult nutrition.  COPD: The patient will have beta agonist available on as as needed bases.  DVT Prophylaxis: Heparin CODE STATUS: Full Code Family Communication: None available Disposition: The patient will be admitted to a medical bed as an inpatient. Status is: Inpatient  Severity of Illness: The appropriate patient status for this patient is INPATIENT. Inpatient status is judged to be reasonable and necessary in order to provide the required intensity of service to ensure the patient's safety. The patient's presenting symptoms, physical exam findings, and initial radiographic and laboratory data in the context of their chronic comorbidities is felt to place them at high risk for further clinical  deterioration. Furthermore, it is not anticipated that the patient will be medically stable for discharge from the hospital within 2 midnights of admission. The following factors support the patient status of inpatient.   " The patient's presenting symptoms include nausea, vomiting, diarrhea, and abdominal pain. " The worrisome physical exam findings include Abdominal pain, cachexia. " The initial radiographic and laboratory data are worrisome because of SMA impinging on duodenum on CT. " The chronic co-morbidities include Esophageal dysphagia, stricture, and pancreatitic insufficiency.  * I certify that at the point of admission it is my clinical judgment that the patient will require inpatient hospital care spanning beyond 2 midnights from the point of admission due to high intensity of service, high risk for further deterioration and high frequency of surveillance required.*  Remains inpatient appropriate because:Inpatient level of care appropriate due to severity of illness   Dispo: The patient is from: Home              Anticipated d/c is to: Home              Anticipated d/c date is: 3 days              Patient currently is not medically stable to d/c.  Edwinna Rochette 04/07/2020, 12:38 AM

## 2020-04-07 NOTE — Transfer of Care (Signed)
Immediate Anesthesia Transfer of Care Note  Patient: Claudia Wiley  Procedure(s) Performed: ESOPHAGOGASTRODUODENOSCOPY (EGD) WITH PROPOFOL (N/A ) ENTEROSCOPY (N/A ) ESOPHAGEAL DILITATION (N/A ) BIOPSY  Patient Location: PACU  Anesthesia Type:MAC  Level of Consciousness: awake and alert   Airway & Oxygen Therapy: Patient Spontanous Breathing and Patient connected to face mask oxygen  Post-op Assessment: Report given to RN and Post -op Vital signs reviewed and stable  Post vital signs: Reviewed and stable  Last Vitals:  Vitals Value Taken Time  BP 106/68 04/07/20 1736  Temp    Pulse 105 04/07/20 1740  Resp 22 04/07/20 1740  SpO2 94 % 04/07/20 1740  Vitals shown include unvalidated device data.  Last Pain:  Vitals:   04/07/20 1539  TempSrc: Oral  PainSc: 8          Complications: No complications documented.

## 2020-04-07 NOTE — Anesthesia Preprocedure Evaluation (Signed)
Anesthesia Evaluation  Patient identified by MRN, date of birth, ID band Patient awake    Reviewed: Allergy & Precautions, NPO status , Patient's Chart, lab work & pertinent test results  Airway Mallampati: II  TM Distance: >3 FB Neck ROM: Full    Dental no notable dental hx.    Pulmonary COPD,  oxygen dependent, former smoker,    Pulmonary exam normal breath sounds clear to auscultation       Cardiovascular hypertension, Normal cardiovascular exam Rhythm:Regular Rate:Normal     Neuro/Psych negative neurological ROS  negative psych ROS   GI/Hepatic Neg liver ROS, Severe weight loss Malnutrition   Endo/Other  negative endocrine ROS  Renal/GU negative Renal ROS  negative genitourinary   Musculoskeletal negative musculoskeletal ROS (+)   Abdominal   Peds negative pediatric ROS (+)  Hematology negative hematology ROS (+)   Anesthesia Other Findings   Reproductive/Obstetrics negative OB ROS                             Anesthesia Physical Anesthesia Plan  ASA: III  Anesthesia Plan: MAC   Post-op Pain Management:    Induction: Intravenous  PONV Risk Score and Plan: 0  Airway Management Planned: Simple Face Mask  Additional Equipment:   Intra-op Plan:   Post-operative Plan:   Informed Consent: I have reviewed the patients History and Physical, chart, labs and discussed the procedure including the risks, benefits and alternatives for the proposed anesthesia with the patient or authorized representative who has indicated his/her understanding and acceptance.     Dental advisory given  Plan Discussed with: CRNA and Surgeon  Anesthesia Plan Comments:         Anesthesia Quick Evaluation

## 2020-04-07 NOTE — Progress Notes (Addendum)
[facility] Office:  [phone] General Surgery Progress Note   LOS: 0 days  POD -     Assessment and Plan: 1.  Chronic dysphagia secondary to esophageal stricture  She had an esophageal encrosis in February, 2021 during an episode of sepsis.  Followed by [facility] with multiple esophageal dilatations for chronic esophageal stricture  2.  Gastric distention with question of distal duodenal stenosis  History of gastroparesis  Question of SMA syndrome  Discussed with Dr. Bryan Lemma - who will plan an upper endo today  3.  Cachectic/malnourished  Will check prealbumin 4.  COPD  Prior LUL lobectomy March 2015 for lung cancer at the [facility] in [place], [place].  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  History of gastrinoma  She did not have a Whipple  On 02/11/2014 at the [facility] in [place], [place], by Dr. 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  They could not determine whether the neuroendocrine tumor was metastatic. No pancreatic tissue mentioned on path report. 6.  DVT prophylaxis - on SQ Heparin 7.  She got first shot of Covid vaccine on 03/30/2020   Active Problems:   Essential hypertension   Nausea & vomiting   Abdominal pain   GERD (gastroesophageal reflux disease)   Pancreatic insufficiency   COPD (chronic obstructive pulmonary disease) (HCC)   Esophageal stricture   Protein-calorie malnutrition, severe   SMAS (superior mesenteric artery syndrome) (HCC)  Subjective:  Complains of abdominal pain  I talked to her daughter, [name], who is going to bring her medical records from [place].  She also has a son, but they have not been in communication with him.  Objective:   Vitals:   04/07/20 0624 04/07/20 0647  BP: (!) 171/101 (!) 142/91  Pulse:  (!) 101 97  Resp: 14   Temp: 98.3 F (36.8 C)   SpO2: 100%      Intake/Output from previous day:  09/23 0701 - 09/24 0700 In: 92.8 [I.V.:92.8] Out: -   Intake/Output this shift:  No intake/output data recorded.   Physical Exam:   General: Cachectic AA F who is alert and oriented.    HEENT: Normal. Pupils equal. .   Lungs: clear   Abdomen: RUQ scar and lower midline scar.  Has abdominal pain, but no peritoneal signs.  Very thin   Lab Results:    Recent Labs    04/06/20 1859 04/07/20 0230  WBC 10.0 12.7*  HGB 14.4 13.1  HCT 45.3 42.6  PLT 320 275    BMET   Recent Labs    04/06/20 1859 04/06/20 1859 04/07/20 0002 04/07/20 0230  NA 141  --   --  141  K 4.4  --   --  4.3  CL 94*  --   --  97*  CO2 27  --   --  27  GLUCOSE 85  --   --  105*  BUN 30*  --   --  27*  CREATININE 0.79   < > 0.66 0.66  CALCIUM 9.3  --   --  8.7*   < > = values in this interval not displayed.    PT/INR   Recent Labs    04/06/20 2130  LABPROT 13.4  INR 1.1    ABG  No results for input(s): PHART, HCO3 in the last 72 hours.  Invalid input(s): PCO2, PO2   Studies/Results:  CT ABDOMEN PELVIS W CONTRAST  Result Date: 04/06/2020 CLINICAL DATA:  Abdominal pain EXAM: CT ABDOMEN AND PELVIS WITH CONTRAST TECHNIQUE: Multidetector CT imaging of the abdomen and pelvis was performed using the standard protocol following bolus administration of intravenous contrast. CONTRAST:  63mL OMNIPAQUE IOHEXOL 300 MG/ML  SOLN COMPARISON:  09/09/2019 FINDINGS: Lower chest: The lung bases are clear. The heart size is normal. Hepatobiliary: The liver is normal. Normal gallbladder.There is no biliary ductal dilation. Pancreas: Normal contours without ductal dilatation. No peripancreatic fluid collection. Spleen: Unremarkable. Adrenals/Urinary Tract: --Adrenal glands: Unremarkable. --Right kidney/ureter: No hydronephrosis or radiopaque kidney stones. --Left kidney/ureter: No hydronephrosis or radiopaque kidney  stones. --Urinary bladder: The urinary bladder is distended. Stomach/Bowel: --Stomach/Duodenum: There is esophageal wall thickening involving the distal esophagus. The stomach is distended. The proximal duodenum is distended to the level of the SMA. --Small bowel: There is no evidence for small bowel obstruction. --Colon: Unremarkable. --Appendix: Normal. Vascular/Lymphatic: Atherosclerotic calcification is present within the non-aneurysmal abdominal aorta, without hemodynamically significant stenosis. --No retroperitoneal lymphadenopathy. --No mesenteric lymphadenopathy. --No pelvic or inguinal lymphadenopathy. Reproductive: Status post hysterectomy. No adnexal mass. Other: No ascites or free air. There has been significant interval loss of subcutaneous fat since the patient's prior CT. Musculoskeletal. No acute displaced fractures. IMPRESSION: 1. Findings suggestive of developing SMA syndrome as detailed above. 2. Distended urinary bladder. Aortic Atherosclerosis (ICD10-I70.0). Electronically Signed   By: Constance Holster M.D.   On: 04/06/2020 23:13   DG Chest Port 1 View  Result Date: 04/06/2020 CLINICAL DATA:  Shortness of breath, sepsis EXAM: PORTABLE CHEST 1 VIEW COMPARISON:  Chest x-ray 10/03/2019 chest x-ray 10/01/2019 FINDINGS: The heart size and mediastinal contours are within normal limits. Hyperinflation of the lungs suggestive of emphysema. Pulmonary sutures overlie the left hemithorax. Biapical pleural/pulmonary scarring again noted. No focal consolidation. No pulmonary edema. Blunting of the left costophrenic angle is again noted which may represent scarring versus trace pleural effusion. No pneumothorax. Well-defined symmetric bilateral lower/mid lung zone densities consistent with nipple shadows. No acute osseous abnormality. IMPRESSION: No active cardiopulmonary disease. Electronically Signed   By: Iven Finn M.D.   On: 04/06/2020 21:53     Anti-infectives:   Anti-infectives (From  admission, onward)   None      Alphonsa Overall, MD, [facility] Surgery Office: [phone] 04/07/2020

## 2020-04-07 NOTE — Anesthesia Procedure Notes (Signed)
Procedure Name: MAC Date/Time: 04/07/2020 4:54 PM Performed by: Lollie Sails, CRNA Pre-anesthesia Checklist: Patient identified, Emergency Drugs available, Suction available, Patient being monitored and Timeout performed Oxygen Delivery Method: Simple face mask Preoxygenation: POM mask used.

## 2020-04-07 NOTE — Consult Note (Signed)
Consultation  Referring Provider:  [facility] Primary Care Physician:  [facility] Primary Gastroenterologist:  Dr.Stark  Reason for Consultation: N/V, dysphagia  HPI: Claudia Wiley is a 64 y.o. female, known to Dr. Fuller Plan, who was readmitted last evening after presenting to the emergency room with 3 to 4-day history of nausea and vomiting inability to keep down p.o.'s or meds.  Also with complaints of rather generalized abdominal discomfort and lower chest discomfort, diarrhea which has slowed over the past 24 hours. 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  She was told this was a Whipple procedure but no evidence of Whipple by more recent EGDs.  She also has history of lung cancer for which she underwent a left upper lobectomy in 2014 2015 also in [place]. She was initially seen by GI in February 2021 at which time she had an acute necrotic esophagus felt likely precipitated by sepsis.  She also had evidence of candidiasis simultaneously.  She was quite ill but gradually improved.  She has developed multiple subsequent strictures, and has undergone several EGDs with dilations as an outpatient. Most recent procedure done on 03/13/2020 per Dr. Fuller Plan with finding of multiple benign strictures in the mid to distal esophagus, she was Savary dilated to 15 mm.  The stomach and duodenal bulb appeared unremarkable.  Work-up last night through the emergency room with CT of the abdomen and pelvis showing esophageal wall thickening involving the distal esophagus, a dilated stomach and the proximal duodenum is distended to the level of the SMA raising question of an SMA syndrome.  COVID-19 negative, hemoglobin 14 hematocrit of 45.3, WBC of 12.7, lactate 1.6, INR 1.1 albumin 3.9 LFTs within normal limits.  Patient says she had been doing pretty well and was able to swallow  soft food since her last dilation.  She received an initial Covid vaccine last Thursday, became ill after that about 48 hours later with nausea and vomiting diarrhea as above.  She says she had been maintaining her weight fairly well with boost supplements 2-3 times daily until this most recent illness.  She is complaining of fairly severe pain currently in the upper abdomen and subxiphoid area.  She had been on Carafate suspension and twice daily PPI at home   Past Medical History:  Diagnosis Date  . Anemia   . Arthritis   . Cancer [facility]) 2013   Upper left lung and stomach cancer  . COPD (chronic obstructive pulmonary disease) ([facility])   . Esophageal stricture   . Family history of brain cancer   . Family history of melanoma   . Gastrinoma   . Gastrinoma   . GERD (gastroesophageal reflux disease)   . Hypertension   . Osteoporosis   . Pancreatic insufficiency   . Pneumonia   . PONV (postoperative nausea and vomiting)   . Vitamin D deficiency   . Weight loss, unintentional     Past Surgical History:  Procedure Laterality Date  . ABDOMINAL HYSTERECTOMY    . BIOPSY  09/11/2019   Procedure: BIOPSY;  Surgeon: Lavena Bullion, DO;  Location: [facility];  Service: Gastroenterology;;  . BIOPSY  10/03/2019   Procedure: BIOPSY;  Surgeon: Jerene Bears, MD;  Location: [facility];  Service: Gastroenterology;;  . BREAST LUMPECTOMY    . CESAREAN SECTION    . ESOPHAGEAL DILATION  03/13/2020   Procedure: ESOPHAGEAL DILATION;  Surgeon: [name], MD;  Location: [facility];  Service: Endoscopy;;  . ESOPHAGOGASTRODUODENOSCOPY (EGD) WITH PROPOFOL N/A 09/11/2019   Procedure: ESOPHAGOGASTRODUODENOSCOPY (EGD) WITH PROPOFOL;  Surgeon: Lavena Bullion, DO;  Location: [facility];  Service: Gastroenterology;  Laterality: N/A;  . ESOPHAGOGASTRODUODENOSCOPY (EGD) WITH PROPOFOL N/A 10/03/2019   Procedure: ESOPHAGOGASTRODUODENOSCOPY (EGD) WITH PROPOFOL;  Surgeon: Jerene Bears, MD;  Location: [facility];  Service: Gastroenterology;  Laterality: N/A;  . ESOPHAGOGASTRODUODENOSCOPY (EGD) WITH PROPOFOL N/A 10/19/2019   Procedure: ESOPHAGOGASTRODUODENOSCOPY (EGD) WITH PROPOFOL;  Surgeon: Yetta Flock, MD;  Location: [facility];  Service: Gastroenterology;  Laterality: N/A;  possible dilation  . ESOPHAGOGASTRODUODENOSCOPY (EGD) WITH PROPOFOL N/A 11/02/2019   Procedure: ESOPHAGOGASTRODUODENOSCOPY (EGD) WITH FLUORO AND  WITH PROPOFOL;  Surgeon: [name], MD;  Location: [facility];  Service: Endoscopy;  Laterality: N/A;  need fluoro  . ESOPHAGOGASTRODUODENOSCOPY (EGD) WITH PROPOFOL N/A 11/16/2019   Procedure: ESOPHAGOGASTRODUODENOSCOPY (EGD) WITH PROPOFOL;  Surgeon: [name], MD;  [facility];  Service: Endoscopy;  Laterality: N/A;  . ESOPHAGOGASTRODUODENOSCOPY (EGD) WITH PROPOFOL N/A 11/29/2019   Procedure: ESOPHAGOGASTRODUODENOSCOPY (EGD) WITH PROPOFOL;  Surgeon: [name], MD;  [facility];  Service: Endoscopy;  Laterality: N/A;  need fluoro  . ESOPHAGOGASTRODUODENOSCOPY (EGD) WITH PROPOFOL N/A 12/21/2019   Procedure: ESOPHAGOGASTRODUODENOSCOPY (EGD) WITH PROPOFOL;  Surgeon: [name], MD;  [facility];  Service: Endoscopy;  Laterality: N/A;  . ESOPHAGOGASTRODUODENOSCOPY (EGD) WITH PROPOFOL N/A 02/07/2020   Procedure: ESOPHAGOGASTRODUODENOSCOPY (EGD) WITH PROPOFOL;  Surgeon: [name], MD;  [facility];  Service: Endoscopy;  Laterality: N/A;  . ESOPHAGOGASTRODUODENOSCOPY (EGD) WITH PROPOFOL N/A 03/13/2020   Procedure: ESOPHAGOGASTRODUODENOSCOPY (EGD) WITH PROPOFOL;  Surgeon: [name], MD;  [facility];  Service: Endoscopy;  Laterality: N/A;  With dilation  . LUNG SURGERY    . SAVORY DILATION N/A 10/19/2019   Procedure: SAVORY DILATION;  Surgeon: Yetta Flock, MD;  [facility];  Service: Gastroenterology;  Laterality: N/A;  . SAVORY DILATION N/A 11/02/2019   Procedure: SAVORY  DILATION;  Surgeon: [name], MD;  Location: [facility];  Service: Endoscopy;  Laterality: N/A;  fluoro  . SAVORY DILATION N/A 11/16/2019   Procedure: SAVORY DILATION;  Surgeon: [name], MD;  Location: [facility];  Service: Endoscopy;  Laterality: N/A;  . SAVORY DILATION N/A 11/29/2019   Procedure: SAVORY DILATION;  Surgeon: [name], MD;  Location: [facility];  Service: Endoscopy;  Laterality: N/A;  . SAVORY DILATION N/A 12/21/2019   Procedure: SAVORY DILATION;  Surgeon: [name], MD;  Location: [facility];  Service: Endoscopy;  Laterality: N/A;  . SAVORY DILATION N/A 02/07/2020   Procedure: SAVORY DILATION;  Surgeon: [name], MD;  Location: [facility];  Service: Endoscopy;  Laterality: N/A;    Prior to Admission medications   Medication Sig Start Date End Date Taking? Authorizing Provider  albuterol (PROVENTIL HFA;VENTOLIN HFA) 108 (90 Base) MCG/ACT inhaler Inhale 2 puffs into the lungs every 6 (six) hours as needed for wheezing or shortness of breath.    Yes [provider]  albuterol (PROVENTIL) (2.5 MG/3ML) 0.083% nebulizer solution Take 2.5 mg by nebulization every 6 (six) hours as needed for wheezing or shortness of breath.   Yes [provider]  budesonide-formoterol (SYMBICORT) 160-4.5 MCG/ACT inhaler Inhale 1 puff into the lungs daily. Patient taking differently: Inhale 1 puff into the lungs in the morning and at bedtime.  12/21/19   [name], MD  Calcium Carbonate Antacid (CALCIUM CARBONATE, DOSED IN MG ELEMENTAL CALCIUM,) 1250 MG/5ML SUSP Take 1,250 mg of elemental calcium by mouth daily.    [provider]  Cholecalciferol (VITAMIN D3) LIQD Take 400 Units by mouth daily.    [provider]  ENSURE (ENSURE) Take 237 mLs by mouth 3 (three) times daily between meals.     [provider]  fluticasone (FLONASE) 50 MCG/ACT nasal spray Place 1 spray into both nostrils daily as needed for allergies or  rhinitis.    [provider]  mirtazapine (REMERON) 15 MG tablet Take 15 mg by mouth at bedtime. *Crushes Tablet*    [provider]  omeprazole (PRILOSEC) 40 MG capsule Take 40 mg by mouth in the morning and at bedtime.    [provider]  pantoprazole (PROTONIX) 40 MG tablet Take 1 tablet (40 mg total) by mouth 2 (two) times daily before a meal. Patient not taking: Reported on 01/26/2020 11/29/19   [name], MD  pregabalin (LYRICA) 150 MG capsule Take 150 mg by mouth at bedtime. Open capsule    [provider]  sucralfate (CARAFATE) 1 GM/10ML suspension Take 1 g by mouth in the morning, at noon, in the evening, and at bedtime.    [provider]  Tiotropium Bromide Monohydrate 2.5 MCG/ACT AERS Inhale 2 puffs into the lungs daily.     [provider]    Current Facility-Administered Medications  Medication Dose Route Frequency Provider Last Rate Last Admin  . fentaNYL (SUBLIMAZE) injection 12.5-50 mcg  12.5-50 mcg Intravenous Q2H PRN Swayze, Ava, DO   25 mcg at 04/07/20 0829  . heparin injection 5,000 Units  5,000 Units Subcutaneous Q8H Swayze, Ava, DO   5,000 Units at 04/07/20 0615  . lactated ringers infusion   Intravenous Continuous Swayze, Ava, DO 75 mL/hr at 04/07/20 0300 Rate Verify at 04/07/20 0300  . ondansetron (ZOFRAN) tablet 4 mg  4 mg Oral Q6H PRN Swayze, Ava, DO       Or  . ondansetron (ZOFRAN) injection 4 mg  4 mg Intravenous Q6H PRN Swayze, Ava, DO   4 mg at 04/07/20 0251    Allergies as of 04/06/2020 - Review Complete 04/06/2020  Allergen Reaction Noted  . Azithromycin Other (See Comments) 11/11/2016  . Boniva [ibandronic acid] Other (See Comments) 10/01/2019  . Gabapentin Other (See Comments) 11/11/2016    Family History  Problem Relation Age of Onset  . Diabetes Sister        x 3  . Cancer Sister 72       brain that metastized to bone, stomach and other organs  . Melanoma Maternal Aunt   . Heart attack  Father   . Pneumonia Brother   . Cancer Other 17       cancer in his chest - nephew  . Colon cancer Neg Hx   . Pancreatic cancer Neg Hx   . Rectal cancer Neg Hx   . Esophageal cancer Neg Hx     Social History   Socioeconomic History  . Marital status: Single    Spouse name: Not on file  . Number of children: Not on file  . Years of education: Not on file  . Highest education level: Not on file  Occupational History  . Not on file  Tobacco Use  . Smoking status: Former Smoker    Packs/day: 0.25    Years: 2.00    Pack years: 0.50    Types: Cigarettes  Quit date: 05/30/2019    Years since quitting: 0.8  . Smokeless tobacco: Never Used  . Tobacco comment: 2 months- quit date  Vaping Use  . Vaping Use: Never used  Substance and Sexual Activity  . Alcohol use: No  . Drug use: No  . Sexual activity: Never  Other Topics Concern  . Not on file  Social History Narrative  . Not on file   Social Determinants of Health   Financial Resource Strain:   . Difficulty of Paying Living Expenses: Not on file  Food Insecurity:   . Worried About Charity fundraiser in the Last Year: Not on file  . Ran Out of Food in the Last Year: Not on file  Transportation Needs:   . Lack of Transportation (Medical): Not on file  . Lack of Transportation (Non-Medical): Not on file  Physical Activity:   . Days of Exercise per Week: Not on file  . Minutes of Exercise per Session: Not on file  Stress:   . Feeling of Stress : Not on file  Social Connections:   . Frequency of Communication with Friends and Family: Not on file  . Frequency of Social Gatherings with Friends and Family: Not on file  . Attends Religious Services: Not on file  . Active Member of Clubs or Organizations: Not on file  . Attends Archivist Meetings: Not on file  . Marital Status: Not on file  Intimate Partner Violence:   . Fear of Current or Ex-Partner: Not on file  . Emotionally Abused: Not on file  .  Physically Abused: Not on file  . Sexually Abused: Not on file    Review of Systems: Pertinent positive and negative review of systems were noted in the above HPI section.  All other review of systems was otherwise negative. Physical Exam: Vital signs in last 24 hours: Temp:  [97.9 F (36.6 C)-98.6 F (37 C)] 98.3 F (36.8 C) (09/24 0624) Pulse Rate:  [97-116] 97 (09/24 0647) Resp:  [14-20] 14 (09/24 0624) BP: (137-171)/(91-147) 142/91 (09/24 0647) SpO2:  [95 %-100 %] 100 % (09/24 0624) Weight:  [30.7 kg] 30.7 kg (09/24 0241) Last BM Date: 04/06/20 General:   Alert,  Well-developed, well-nourished, pleasant and cooperative in NAD Head:  Normocephalic and atraumatic. Eyes:  Sclera clear, no icterus.   Conjunctiva pink. Ears:  Normal auditory acuity. Nose:  No deformity, discharge,  or lesions. Mouth:  No deformity or lesions.   Neck:  Supple; no masses or thyromegaly. Lungs:  Clear throughout to auscultation.   No wheezes, crackles, or rhonchi. Heart:  Regular rate and rhythm; no murmurs, clicks, rubs,  or gallops. Abdomen:  Soft,nontender, BS active,nonpalp mass or hsm.   Rectal:  Deferred  Msk:  Symmetrical without gross deformities. . Pulses:  Normal pulses noted. Extremities:  Without clubbing or edema. Neurologic:  Alert and  oriented x4;  grossly normal neurologically. Skin:  Intact without significant lesions or rashes.. Psych:  Alert and cooperative. Normal mood and affect.  Intake/Output from previous day: 09/23 0701 - 09/24 0700 In: 92.8 [I.V.:92.8] Out: -  Intake/Output this shift: No intake/output data recorded.  Lab Results: Recent Labs    04/06/20 1859 04/07/20 0230  WBC 10.0 12.7*  HGB 14.4 13.1  HCT 45.3 42.6  PLT 320 275   BMET Recent Labs    04/06/20 1859 04/07/20 0002 04/07/20 0230  NA 141  --  141  K 4.4  --  4.3  CL 94*  --  97*  CO2 27  --  27  GLUCOSE 85  --  105*  BUN 30*  --  27*  CREATININE 0.79 0.66 0.66  CALCIUM 9.3  --  8.7*    LFT Recent Labs    04/07/20 0230  PROT 6.7  ALBUMIN 3.9  AST 26  ALT 12  ALKPHOS 64  BILITOT 0.9   PT/INR Recent Labs    04/06/20 2130  LABPROT 13.4  INR 1.1   Hepatitis Panel No results for input(s): HEPBSAG, HCVAB, HEPAIGM, HEPBIGM in the last 72 hours.     IMPRESSION;  #68 64 year old African-American female with 4-day history of acute nausea vomiting abdominal pain and some diarrhea which is now improving.  Onset of illness 48 hours post initial Covid vaccine. Work-up thus far with CT shows distal esophageal wall thickening, a dilated stomach and dilated proximal duodenum to the level of the SMA.  Rule out duodenal/jejunal stricture, obstruction with secondary partial gastric outlet obstruction.  #2 history of acute esophageal necrosis February 2021 precipitated by sepsis.  Also treated for esophageal candidiasis at that same time. Subsequent development of multiple esophageal strictures requiring serial outpatient dilations over the past few months.  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  This was reported as a Whipple procedure however her current anatomy does not support that.  #4 status post left upper lobectomy 2014/15 for lung cancer.  Unclear if this was the same process responsible for the pancreatic lesion  #5 weight loss secondary to above 6.  COPD #7 history of hypertension  Plan; keep n.p.o. IV PPI twice daily Around-the-clock antiemetics and as needed analgesics Discussed with Dr. Boykin Nearing this morning.  Initial plan was for upper GI today.  We will hold on upper GI and plan to proceed with EGD/enteroscopy per Dr. Bryan Lemma this afternoon to further assess the proximal small bowel anatomy, rule out stricture/obstruction.  Will not plan to dilate esophagus today. She may need NG tube placed at the time of endoscopy to decompress her stomach. Patient says that her daughter has all of her medical records  from [place], Dr. Lucia Gaskins was going to discuss with the daughter and have her bring all the records to the hospital for review. Further plans pending findings at EGD this afternoon. Hold Subcu heparin until after procedures. Prealbumin has been ordered     Claudia Wight EsterwoodPA-C  04/07/2020, 8:56 AM

## 2020-04-07 NOTE — ED Notes (Signed)
Patient has had previous throat strictures. Pt. Is seen every two weeks for throat strictures.

## 2020-04-07 NOTE — H&P (View-Only) (Signed)
Consultation  Referring Provider:  [facility] Primary Care Physician:  [facility] Primary Gastroenterologist:  Dr.Stark  Reason for Consultation: N/V, dysphagia  HPI: Claudia Wiley is a 64 y.o. female, known to Dr. Fuller Plan, who was readmitted last evening after presenting to the emergency room with 3 to 4-day history of nausea and vomiting inability to keep down p.o.'s or meds.  Also with complaints of rather generalized abdominal discomfort and lower chest discomfort, diarrhea which has slowed over the past 24 hours. 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  She was told this was a Whipple procedure but no evidence of Whipple by more recent EGDs.  She also has history of lung cancer for which she underwent a left upper lobectomy in 2014 2015 also in [place]. She was initially seen by GI in February 2021 at which time she had an acute necrotic esophagus felt likely precipitated by sepsis.  She also had evidence of candidiasis simultaneously.  She was quite ill but gradually improved.  She has developed multiple subsequent strictures, and has undergone several EGDs with dilations as an outpatient. Most recent procedure done on 03/13/2020 per Dr. Fuller Plan with finding of multiple benign strictures in the mid to distal esophagus, she was Savary dilated to 15 mm.  The stomach and duodenal bulb appeared unremarkable.  Work-up last night through the emergency room with CT of the abdomen and pelvis showing esophageal wall thickening involving the distal esophagus, a dilated stomach and the proximal duodenum is distended to the level of the SMA raising question of an SMA syndrome.  COVID-19 negative, hemoglobin 14 hematocrit of 45.3, WBC of 12.7, lactate 1.6, INR 1.1 albumin 3.9 LFTs within normal limits.  Patient says she had been doing pretty well and was able to swallow  soft food since her last dilation.  She received an initial Covid vaccine last Thursday, became ill after that about 48 hours later with nausea and vomiting diarrhea as above.  She says she had been maintaining her weight fairly well with boost supplements 2-3 times daily until this most recent illness.  She is complaining of fairly severe pain currently in the upper abdomen and subxiphoid area.  She had been on Carafate suspension and twice daily PPI at home   Past Medical History:  Diagnosis Date  . Anemia   . Arthritis   . Cancer [facility]) 2013   Upper left lung and stomach cancer  . COPD (chronic obstructive pulmonary disease) ([place])   . Esophageal stricture   . Family history of brain cancer   . Family history of melanoma   . Gastrinoma   . Gastrinoma   . GERD (gastroesophageal reflux disease)   . Hypertension   . Osteoporosis   . Pancreatic insufficiency   . Pneumonia   . PONV (postoperative nausea and vomiting)   . Vitamin D deficiency   . Weight loss, unintentional     Past Surgical History:  Procedure Laterality Date  . ABDOMINAL HYSTERECTOMY    . BIOPSY  09/11/2019   Procedure: BIOPSY;  Surgeon: Lavena Bullion, DO;  Location: [facility];  Service: Gastroenterology;;  . BIOPSY  10/03/2019   Procedure: BIOPSY;  Surgeon: Jerene Bears, MD;  Location: [facility];  Service: Gastroenterology;;  . BREAST LUMPECTOMY    . CESAREAN SECTION    . ESOPHAGEAL DILATION  03/13/2020   Procedure: ESOPHAGEAL DILATION;  Surgeon: [name], MD;  Location: [facility];  Service: Endoscopy;;  . ESOPHAGOGASTRODUODENOSCOPY (EGD) WITH PROPOFOL N/A 09/11/2019   Procedure: ESOPHAGOGASTRODUODENOSCOPY (EGD) WITH PROPOFOL;  Surgeon: Lavena Bullion, DO;  Location: [facility];  Service: Gastroenterology;  Laterality: N/A;  . ESOPHAGOGASTRODUODENOSCOPY (EGD) WITH PROPOFOL N/A 10/03/2019   Procedure: ESOPHAGOGASTRODUODENOSCOPY (EGD) WITH PROPOFOL;  Surgeon: Jerene Bears, MD;  [facility]: [facility];  Service: Gastroenterology;  Laterality: N/A;  . ESOPHAGOGASTRODUODENOSCOPY (EGD) WITH PROPOFOL N/A 10/19/2019   Procedure: ESOPHAGOGASTRODUODENOSCOPY (EGD) WITH PROPOFOL;  Surgeon: Yetta Flock, MD;  Location: [facility];  Service: Gastroenterology;  Laterality: N/A;  possible dilation  . ESOPHAGOGASTRODUODENOSCOPY (EGD) WITH PROPOFOL N/A 11/02/2019   Procedure: ESOPHAGOGASTRODUODENOSCOPY (EGD) WITH FLUORO AND  WITH PROPOFOL;  Surgeon: [name], MD;  Location: [facility];  Service: Endoscopy;  Laterality: N/A;  need fluoro  . ESOPHAGOGASTRODUODENOSCOPY (EGD) WITH PROPOFOL N/A 11/16/2019   Procedure: ESOPHAGOGASTRODUODENOSCOPY (EGD) WITH PROPOFOL;  Surgeon: [name], MD;  [facility];  Service: Endoscopy;  Laterality: N/A;  . ESOPHAGOGASTRODUODENOSCOPY (EGD) WITH PROPOFOL N/A 11/29/2019   Procedure: ESOPHAGOGASTRODUODENOSCOPY (EGD) WITH PROPOFOL;  Surgeon: [name], MD;  [facility];  Service: Endoscopy;  Laterality: N/A;  need fluoro  . ESOPHAGOGASTRODUODENOSCOPY (EGD) WITH PROPOFOL N/A 12/21/2019   Procedure: ESOPHAGOGASTRODUODENOSCOPY (EGD) WITH PROPOFOL;  Surgeon: [name], MD;  [facility];  Service: Endoscopy;  Laterality: N/A;  . ESOPHAGOGASTRODUODENOSCOPY (EGD) WITH PROPOFOL N/A 02/07/2020   Procedure: ESOPHAGOGASTRODUODENOSCOPY (EGD) WITH PROPOFOL;  Surgeon: [name], MD;  [facility];  Service: Endoscopy;  Laterality: N/A;  . ESOPHAGOGASTRODUODENOSCOPY (EGD) WITH PROPOFOL N/A 03/13/2020   Procedure: ESOPHAGOGASTRODUODENOSCOPY (EGD) WITH PROPOFOL;  Surgeon: [name], MD;  [facility];  Service: Endoscopy;  Laterality: N/A;  With dilation  . LUNG SURGERY    . SAVORY DILATION N/A 10/19/2019   Procedure: SAVORY DILATION;  Surgeon: Yetta Flock, MD;  [facility];  Service: Gastroenterology;  Laterality: N/A;  . SAVORY DILATION N/A 11/02/2019   Procedure: SAVORY  DILATION;  Surgeon: [name], MD;  Location: [facility];  Service: Endoscopy;  Laterality: N/A;  fluoro  . SAVORY DILATION N/A 11/16/2019   Procedure: SAVORY DILATION;  Surgeon: [name], MD;  Location: [facility];  Service: Endoscopy;  Laterality: N/A;  . SAVORY DILATION N/A 11/29/2019   Procedure: SAVORY DILATION;  Surgeon: [name], MD;  [facility]: [facility];  Service: Endoscopy;  Laterality: N/A;  . SAVORY DILATION N/A 12/21/2019   Procedure: SAVORY DILATION;  Surgeon: [name], MD;  Location: [facility];  Service: Endoscopy;  Laterality: N/A;  . SAVORY DILATION N/A 02/07/2020   Procedure: SAVORY DILATION;  Surgeon: [name], MD;  Location: [facility];  Service: Endoscopy;  Laterality: N/A;    Prior to Admission medications   Medication Sig Start Date End Date Taking? Authorizing Provider  albuterol (PROVENTIL HFA;VENTOLIN HFA) 108 (90 Base) MCG/ACT inhaler Inhale 2 puffs into the lungs every 6 (six) hours as needed for wheezing or shortness of breath.    Yes [provider]  albuterol (PROVENTIL) (2.5 MG/3ML) 0.083% nebulizer solution Take 2.5 mg by nebulization every 6 (six) hours as needed for wheezing or shortness of breath.   Yes [provider]  budesonide-formoterol (SYMBICORT) 160-4.5 MCG/ACT inhaler Inhale 1 puff into the lungs daily. Patient taking differently: Inhale 1 puff into the lungs in the morning and at bedtime.  12/21/19   [name], MD  Calcium Carbonate Antacid (CALCIUM CARBONATE, DOSED IN MG ELEMENTAL CALCIUM,) 1250 MG/5ML SUSP Take 1,250 mg of elemental calcium by mouth daily.    [provider]  Cholecalciferol (VITAMIN D3) LIQD Take 400 Units by mouth daily.    [provider]  ENSURE (ENSURE) Take 237 mLs by mouth 3 (three) times daily between meals.     [provider]  fluticasone (FLONASE) 50 MCG/ACT nasal spray Place 1 spray into both nostrils daily as needed for allergies or  rhinitis.    [provider]  mirtazapine (REMERON) 15 MG tablet Take 15 mg by mouth at bedtime. *Crushes Tablet*    [provider]  omeprazole (PRILOSEC) 40 MG capsule Take 40 mg by mouth in the morning and at bedtime.    [provider]  pantoprazole (PROTONIX) 40 MG tablet Take 1 tablet (40 mg total) by mouth 2 (two) times daily before a meal. Patient not taking: Reported on 01/26/2020 11/29/19   [name], MD  pregabalin (LYRICA) 150 MG capsule Take 150 mg by mouth at bedtime. Open capsule    [provider]  sucralfate (CARAFATE) 1 GM/10ML suspension Take 1 g by mouth in the morning, at noon, in the evening, and at bedtime.    [provider]  Tiotropium Bromide Monohydrate 2.5 MCG/ACT AERS Inhale 2 puffs into the lungs daily.     [provider]    Current Facility-Administered Medications  Medication Dose Route Frequency Provider Last Rate Last Admin  . fentaNYL (SUBLIMAZE) injection 12.5-50 mcg  12.5-50 mcg Intravenous Q2H PRN Swayze, Ava, DO   25 mcg at 04/07/20 0829  . heparin injection 5,000 Units  5,000 Units Subcutaneous Q8H Swayze, Ava, DO   5,000 Units at 04/07/20 0615  . lactated ringers infusion   Intravenous Continuous Swayze, Ava, DO 75 mL/hr at 04/07/20 0300 Rate Verify at 04/07/20 0300  . ondansetron (ZOFRAN) tablet 4 mg  4 mg Oral Q6H PRN Swayze, Ava, DO       Or  . ondansetron (ZOFRAN) injection 4 mg  4 mg Intravenous Q6H PRN Swayze, Ava, DO   4 mg at 04/07/20 0251    Allergies as of 04/06/2020 - Review Complete 04/06/2020  Allergen Reaction Noted  . Azithromycin Other (See Comments) 11/11/2016  . Boniva [ibandronic acid] Other (See Comments) 10/01/2019  . Gabapentin Other (See Comments) 11/11/2016    Family History  Problem Relation Age of Onset  . Diabetes Sister        x 3  . Cancer Sister 3       brain that metastized to bone, stomach and other organs  . Melanoma Maternal Aunt   . Heart attack  Father   . Pneumonia Brother   . Cancer Other 17       cancer in his chest - nephew  . Colon cancer Neg Hx   . Pancreatic cancer Neg Hx   . Rectal cancer Neg Hx   . Esophageal cancer Neg Hx     Social History   Socioeconomic History  . Marital status: Single    Spouse name: Not on file  . Number of children: Not on file  . Years of education: Not on file  . Highest education level: Not on file  Occupational History  . Not on file  Tobacco Use  . Smoking status: Former Smoker    Packs/day: 0.25    Years: 2.00    Pack years: 0.50    Types: Cigarettes  Quit date: 05/30/2019    Years since quitting: 0.8  . Smokeless tobacco: Never Used  . Tobacco comment: 2 months- quit date  Vaping Use  . Vaping Use: Never used  Substance and Sexual Activity  . Alcohol use: No  . Drug use: No  . Sexual activity: Never  Other Topics Concern  . Not on file  Social History Narrative  . Not on file   Social Determinants of Health   Financial Resource Strain:   . Difficulty of Paying Living Expenses: Not on file  Food Insecurity:   . Worried About Charity fundraiser in the Last Year: Not on file  . Ran Out of Food in the Last Year: Not on file  Transportation Needs:   . Lack of Transportation (Medical): Not on file  . Lack of Transportation (Non-Medical): Not on file  Physical Activity:   . Days of Exercise per Week: Not on file  . Minutes of Exercise per Session: Not on file  Stress:   . Feeling of Stress : Not on file  Social Connections:   . Frequency of Communication with Friends and Family: Not on file  . Frequency of Social Gatherings with Friends and Family: Not on file  . Attends Religious Services: Not on file  . Active Member of Clubs or Organizations: Not on file  . Attends Archivist Meetings: Not on file  . Marital Status: Not on file  Intimate Partner Violence:   . Fear of Current or Ex-Partner: Not on file  . Emotionally Abused: Not on file  .  Physically Abused: Not on file  . Sexually Abused: Not on file    Review of Systems: Pertinent positive and negative review of systems were noted in the above HPI section.  All other review of systems was otherwise negative. Physical Exam: Vital signs in last 24 hours: Temp:  [97.9 F (36.6 C)-98.6 F (37 C)] 98.3 F (36.8 C) (09/24 0624) Pulse Rate:  [97-116] 97 (09/24 0647) Resp:  [14-20] 14 (09/24 0624) BP: (137-171)/(91-147) 142/91 (09/24 0647) SpO2:  [95 %-100 %] 100 % (09/24 0624) Weight:  [30.7 kg] 30.7 kg (09/24 0241) Last BM Date: 04/06/20 General:   Alert,  Well-developed, well-nourished, pleasant and cooperative in NAD Head:  Normocephalic and atraumatic. Eyes:  Sclera clear, no icterus.   Conjunctiva pink. Ears:  Normal auditory acuity. Nose:  No deformity, discharge,  or lesions. Mouth:  No deformity or lesions.   Neck:  Supple; no masses or thyromegaly. Lungs:  Clear throughout to auscultation.   No wheezes, crackles, or rhonchi. Heart:  Regular rate and rhythm; no murmurs, clicks, rubs,  or gallops. Abdomen:  Soft,nontender, BS active,nonpalp mass or hsm.   Rectal:  Deferred  Msk:  Symmetrical without gross deformities. . Pulses:  Normal pulses noted. Extremities:  Without clubbing or edema. Neurologic:  Alert and  oriented x4;  grossly normal neurologically. Skin:  Intact without significant lesions or rashes.. Psych:  Alert and cooperative. Normal mood and affect.  Intake/Output from previous day: 09/23 0701 - 09/24 0700 In: 92.8 [I.V.:92.8] Out: -  Intake/Output this shift: No intake/output data recorded.  Lab Results: Recent Labs    04/06/20 1859 04/07/20 0230  WBC 10.0 12.7*  HGB 14.4 13.1  HCT 45.3 42.6  PLT 320 275   BMET Recent Labs    04/06/20 1859 04/07/20 0002 04/07/20 0230  NA 141  --  141  K 4.4  --  4.3  CL 94*  --  97*  CO2 27  --  27  GLUCOSE 85  --  105*  BUN 30*  --  27*  CREATININE 0.79 0.66 0.66  CALCIUM 9.3  --  8.7*    LFT Recent Labs    04/07/20 0230  PROT 6.7  ALBUMIN 3.9  AST 26  ALT 12  ALKPHOS 64  BILITOT 0.9   PT/INR Recent Labs    04/06/20 2130  LABPROT 13.4  INR 1.1   Hepatitis Panel No results for input(s): HEPBSAG, HCVAB, HEPAIGM, HEPBIGM in the last 72 hours.     IMPRESSION;  #57 64 year old African-American female with 4-day history of acute nausea vomiting abdominal pain and some diarrhea which is now improving.  Onset of illness 48 hours post initial Covid vaccine. Work-up thus far with CT shows distal esophageal wall thickening, a dilated stomach and dilated proximal duodenum to the level of the SMA.  Rule out duodenal/jejunal stricture, obstruction with secondary partial gastric outlet obstruction.  #2 history of acute esophageal necrosis February 2021 precipitated by sepsis.  Also treated for esophageal candidiasis at that same time. Subsequent development of multiple esophageal strictures requiring serial outpatient dilations over the past few months.  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  This was reported as a Whipple procedure however her current anatomy does not support that.  #4 status post left upper lobectomy 2014/15 for lung cancer.  Unclear if this was the same process responsible for the pancreatic lesion  #5 weight loss secondary to above 6.  COPD #7 history of hypertension  Plan; keep n.p.o. IV PPI twice daily Around-the-clock antiemetics and as needed analgesics Discussed with Dr. Boykin Nearing this morning.  Initial plan was for upper GI today.  We will hold on upper GI and plan to proceed with EGD/enteroscopy per Dr. Bryan Lemma this afternoon to further assess the proximal small bowel anatomy, rule out stricture/obstruction.  Will not plan to dilate esophagus today. She may need NG tube placed at the time of endoscopy to decompress her stomach. Patient says that her daughter has all of her medical records  from [place], Dr. Lucia Gaskins was going to discuss with the daughter and have her bring all the records to the hospital for review. Further plans pending findings at EGD this afternoon. Hold Subcu heparin until after procedures. Prealbumin has been ordered     Aydrian Halpin EsterwoodPA-C  04/07/2020, 8:56 AM

## 2020-04-07 NOTE — Op Note (Signed)
[facility] Patient Name: Claudia Wiley Procedure Date: 04/07/2020 MRN: [id number] Attending MD: Gerrit Heck , MD Date of Birth: 1955-09-27 CSN: [id number] Age: 64 Admit Type: Inpatient Procedure:                Upper GI endoscopy Indications:              Generalized abdominal pain, Dysphagia, Stricture of                            the esophagus, Abnormal CT of the GI tract, Nausea                            with vomiting Providers:                Gerrit Heck, MD, Glori Bickers, RN, Erenest Rasher, RN, Faustina Mbumina, Technician, Theodora Blow, Technician Referring MD:              Medicines:                Monitored Anesthesia Care Complications:            No immediate complications. Estimated Blood Loss:     Estimated blood loss was minimal. Procedure:                Pre-Anesthesia Assessment:                           - Prior to the procedure, a History and Physical                            was performed, and patient medications and                            allergies were reviewed. The patient's tolerance of                            previous anesthesia was also reviewed. The risks                            and benefits of the procedure and the sedation                            options and risks were discussed with the patient.                            All questions were answered, and informed consent                            was obtained. Prior Anticoagulants: The patient has                            taken  no previous anticoagulant or antiplatelet                            agents. ASA Grade Assessment: III - A patient with                            severe systemic disease. After reviewing the risks                            and benefits, the patient was deemed in                            satisfactory condition to undergo the procedure.                           After obtaining informed  consent, the endoscope was                            passed under direct vision. Throughout the                            procedure, the patient's blood pressure, pulse, and                            oxygen saturations were monitored continuously. The                            SIF-Q180 ([id number]) Olympus enteroscope was                            introduced through the mouth, and advanced to the                            third part of duodenum. The upper GI endoscopy was                            technically difficult and complex due to a J-shaped                            stomach which made pyloric intubation difficult.                            Successful completion of the procedure was aided by                            using manual pressure. The patient tolerated the                            procedure well. Scope In: Scope Out: Findings:      Multiple benign-appearing, intrinsic moderate stenoses were found       throughout the esophagus. The most pronounced was located 35 cm from the       incisors. The narrowest stenosis measured 7 mm (inner diameter). Despite  using a smaller caliber SIF enteroscope, the stenosis was only traversed       after dilation. A TTS dilator was passed through the scope. Dilation       with an 02-20-09 mm balloon dilator was performed to 9 mm. The dilation       site was examined and showed mild mucosal disruption and moderate       improvement in luminal narrowing. Estimated blood loss was minimal.      A medium amount of food (residue) was found in the gastric body. The       stomach was J shaped with significant looping of the enteroscope once       advanced into the duodenum. External pressure was applied to the       patient's abdomen to assist advancement of the scope. A forceps was       advanced into the working channel as a means to "stiffen" the       enteroscope and facilitate advancement.      A hiatal hernia was present.       Many cratered duodenal ulcers were found in the duodenal bulb, in the       first portion of the duodenum and in the second portion of the duodenum.       The largest lesion was 10 mm in largest dimension, and several contained       necrotic appearing material. Biopsies were taken with a cold forceps for       histology. Estimated blood loss was minimal.      An area of prnounced extrinsic compression was in the third portion of       the duodenum. This was pulsatile. The overlying mucosa in that area was       otherwise normal. A combination of the luminal narrowing and significant       looping in the stomach precluded advancement through this area. Exchange       for a larger caliber endoscope was not possible due to the significant       esophageal stricturing. Impression:               - Multiple benign-appearing esophageal stenoses                            consistent with history of known esophageal                            strictures secondary to prior Acute Esophageal                            Necrosis. The most pronounced stricture was only                            traversed after TTS balloon dilation to 9 mm with                            appropriate mucosal rent formation.                           - A medium amount of food (residue) in the stomach.                           -  Hiatal hernia.                           - Multiple deep duodenal ulcers. Biopsied.                           - Extrinsic duodenal stenosis in the 3rd portion of                            the duodenum. 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 Due to a number of                            factors as described above, this could not be                            traversed.                           - At the conclusion of the procedure, a 14 Fr NG                             tube was placed in the right nares and advanced                            endoscopically into the stomach. Moderate Sedation:      Not Applicable - Patient had care per Anesthesia. Recommendation:           - Return patient to hospital ward for ongoing care.                           - To remain NPO.                           - Obtain X ray to confirm adequate positioning of                            NG tube then set to LIWS.                           - Continue present medications.                           - Await pathology results.                           - May need to consider alternate means of                            nutritional support, to include gastro-J tube.  Procedure Code(s):        --- Professional ---                           [id number], Esophagogastroduodenoscopy, flexible,                            transoral; with transendoscopic balloon dilation of                            esophagus (less than 30 mm diameter)                           43239, 59, Esophagogastroduodenoscopy, flexible,                            transoral; with biopsy, single or multiple Diagnosis Code(s):        --- Professional ---                           K22.2, Esophageal obstruction                           K44.9, Diaphragmatic hernia without obstruction or                            gangrene                           K26.9, Duodenal ulcer, unspecified as acute or                            chronic, without hemorrhage or perforation                           R10.84, Generalized abdominal pain                           R13.10, Dysphagia, unspecified                           R11.2, Nausea with vomiting, unspecified                           R93.3, Abnormal findings on diagnostic imaging of                            other parts of digestive tract CPT copyright 2019 [employer]. All rights reserved. The codes documented in this report are preliminary and upon coder review may   be revised to meet current compliance requirements. Gerrit Heck, MD 04/07/2020 5:52:51 PM Number of Addenda: 0

## 2020-04-07 NOTE — TOC Initial Note (Signed)
Transition of Care [facility]) - Initial/Assessment Note    Patient Details  Name: Claudia Wiley MRN: [id number] Date of Birth: Jan 10, 1956  Transition of Care [facility]) CM/SW Contact:    Lynnell Catalan, RN Phone Number: 04/07/2020, 2:11 PM  Clinical Narrative:                  Per [facility] liaison pt was active with [facility] for [facility]. TOC will continue to follow for dc needs.     Activities of Daily Living Home Assistive Devices/Equipment: Wheelchair, Environmental consultant (specify type) ADL Screening (condition at time of admission) Patient's cognitive ability adequate to safely complete daily activities?: Yes Is the patient deaf or have difficulty hearing?: No Does the patient have difficulty seeing, even when wearing glasses/contacts?: No Does the patient have difficulty concentrating, remembering, or making decisions?: No Patient able to express need for assistance with ADLs?: Yes Does the patient have difficulty dressing or bathing?: No Independently performs ADLs?: No Communication: Independent Dressing (OT): Independent Grooming: Independent Feeding: Independent Bathing: Independent Toileting: Needs assistance Is this a change from baseline?: Pre-admission baseline In/Out Bed: Needs assistance Is this a change from baseline?: Pre-admission baseline Walks in Home: Independent Does the patient have difficulty walking or climbing stairs?: Yes Weakness of Legs: Both Weakness of Arms/Hands: Both  Permission Sought/Granted                  Emotional Assessment              Admission diagnosis:  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 SMAS (superior mesenteric artery syndrome) ([place]) 04/07/2020  . Dysphagia   . Protein-calorie malnutrition, severe 10/06/2019  . Orthostasis 10/01/2019  . Candida esophagitis ([place]) 10/01/2019  . Odynophagia   .  Leukocytosis   . Esophagitis   . Esophageal dysphagia   . Esophageal stricture   . Respiratory failure ([place]) 09/09/2019  . COPD (chronic obstructive pulmonary disease) ([place]) 06/30/2019  . Acute on chronic respiratory failure with hypoxia and hypercapnia ([place]) 06/29/2019  . GERD (gastroesophageal reflux disease)   . Pancreatic insufficiency   . Nausea & vomiting 04/03/2019  . Abdominal pain 04/03/2019  . Abnormal finding on urinalysis 04/03/2019  . Acute respiratory failure with hypoxia and hypercapnia ([place]) 04/02/2019  . COPD with acute exacerbation ([place]) 06/15/2018  . Essential hypertension 06/15/2018  . Genetic testing 06/01/2018  . Gastrinoma   . Family history of melanoma   . Family history of brain cancer   . Hypokalemia 10/19/2011  . Unspecified protein-calorie malnutrition ([place]) 10/19/2011  . Weight loss 10/19/2011  . Generalized weakness 10/19/2011  . Dysphagia 10/19/2011  . Anodontia 10/19/2011   PCP:  Clinic, [place]:   [place], [address] [place] [address] Phone: [phone] Fax: [phone]  [place], [place] - [address] [place] [phone] [place] [address] Phone: [phone] Fax: [phone]     Social Determinants of Health (SDOH) Interventions    Readmission Risk Interventions Readmission Risk Prevention Plan 10/05/2019  Transportation Screening Complete  PCP or Specialist Appt within 3-5 Days Complete  HRI or [place] Not Complete  Social Work Consult for [place] Planning/Counseling Not Complete  Palliative Care Screening Not Applicable  Medication Review Press photographer) Complete  Some recent data might be hidden

## 2020-04-07 NOTE — H&P (View-Only) (Signed)
Consultation  Referring Provider:  [facility] Primary Care Physician:  Cl[facility], [facility] Primary Gastroenterologist:  Dr.Stark  Reason for Consultation: N/V, dysphagia  HPI: Claudia Wiley is a 64 y.o. female, known to Dr. Fuller Plan, who was readmitted last evening after presenting to the emergency room with 3 to 4-day history of nausea and vomiting inability to keep down p.o.'s or meds.  Also with complaints of rather generalized abdominal discomfort and lower chest discomfort, diarrhea which has slowed over the past 24 hours. 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  She was told this was a Whipple procedure but no evidence of Whipple by more recent EGDs.  She also has history of lung cancer for which she underwent a left upper lobectomy in 2014 2015 also in [place]. She was initially seen by GI in February 2021 at which time she had an acute necrotic esophagus felt likely precipitated by sepsis.  She also had evidence of candidiasis simultaneously.  She was quite ill but gradually improved.  She has developed multiple subsequent strictures, and has undergone several EGDs with dilations as an outpatient. Most recent procedure done on 03/13/2020 per Dr. Fuller Plan with finding of multiple benign strictures in the mid to distal esophagus, she was Savary dilated to 15 mm.  The stomach and duodenal bulb appeared unremarkable.  Work-up last night through the emergency room with CT of the abdomen and pelvis showing esophageal wall thickening involving the distal esophagus, a dilated stomach and the proximal duodenum is distended to the level of the SMA raising question of an SMA syndrome.  COVID-19 negative, hemoglobin 14 hematocrit of 45.3, WBC of 12.7, lactate 1.6, INR 1.1 albumin 3.9 LFTs within normal limits.  Patient says she had been doing pretty well and was able to swallow  soft food since her last dilation.  She received an initial Covid vaccine last Thursday, became ill after that about 48 hours later with nausea and vomiting diarrhea as above.  She says she had been maintaining her weight fairly well with boost supplements 2-3 times daily until this most recent illness.  She is complaining of fairly severe pain currently in the upper abdomen and subxiphoid area.  She had been on Carafate suspension and twice daily PPI at home   Past Medical History:  Diagnosis Date  . Anemia   . Arthritis   . Cancer [facility]) 2013   Upper left lung and stomach cancer  . COPD (chronic obstructive pulmonary disease) ([place])   . Esophageal stricture   . Family history of brain cancer   . Family history of melanoma   . Gastrinoma   . Gastrinoma   . GERD (gastroesophageal reflux disease)   . Hypertension   . Osteoporosis   . Pancreatic insufficiency   . Pneumonia   . PONV (postoperative nausea and vomiting)   . Vitamin D deficiency   . Weight loss, unintentional     Past Surgical History:  Procedure Laterality Date  . ABDOMINAL HYSTERECTOMY    . BIOPSY  09/11/2019   Procedure: BIOPSY;  Surgeon: Lavena Bullion, DO;  Location: [facility];  Service: Gastroenterology;;  . BIOPSY  10/03/2019   Procedure: BIOPSY;  Surgeon: Jerene Bears, MD;  Location: [facility];  Service: Gastroenterology;;  . BREAST LUMPECTOMY    . CESAREAN SECTION    . ESOPHAGEAL DILATION  03/13/2020   Procedure: ESOPHAGEAL DILATION;  Surgeon: [name], MD;  Location: [facility];  Service: Endoscopy;;  . ESOPHAGOGASTRODUODENOSCOPY (EGD) WITH PROPOFOL N/A 09/11/2019   Procedure: ESOPHAGOGASTRODUODENOSCOPY (EGD) WITH PROPOFOL;  Surgeon: Lavena Bullion, DO;  Location: [facility];  Service: Gastroenterology;  Laterality: N/A;  . ESOPHAGOGASTRODUODENOSCOPY (EGD) WITH PROPOFOL N/A 10/03/2019   Procedure: ESOPHAGOGASTRODUODENOSCOPY (EGD) WITH PROPOFOL;  Surgeon: Jerene Bears, MD;  [facility]: [facility];  Service: Gastroenterology;  Laterality: N/A;  . ESOPHAGOGASTRODUODENOSCOPY (EGD) WITH PROPOFOL N/A 10/19/2019   Procedure: ESOPHAGOGASTRODUODENOSCOPY (EGD) WITH PROPOFOL;  Surgeon: Yetta Flock, MD;  Location: [facility];  Service: Gastroenterology;  Laterality: N/A;  possible dilation  . ESOPHAGOGASTRODUODENOSCOPY (EGD) WITH PROPOFOL N/A 11/02/2019   Procedure: ESOPHAGOGASTRODUODENOSCOPY (EGD) WITH FLUORO AND  WITH PROPOFOL;  Surgeon: [name], MD;  Location: [facility];  Service: Endoscopy;  Laterality: N/A;  need fluoro  . ESOPHAGOGASTRODUODENOSCOPY (EGD) WITH PROPOFOL N/A 11/16/2019   Procedure: ESOPHAGOGASTRODUODENOSCOPY (EGD) WITH PROPOFOL;  Surgeon: [name], MD;  [facility]: [facility];  Service: Endoscopy;  Laterality: N/A;  . ESOPHAGOGASTRODUODENOSCOPY (EGD) WITH PROPOFOL N/A 11/29/2019   Procedure: ESOPHAGOGASTRODUODENOSCOPY (EGD) WITH PROPOFOL;  Surgeon: [name], MD;  [facility];  Service: Endoscopy;  Laterality: N/A;  need fluoro  . ESOPHAGOGASTRODUODENOSCOPY (EGD) WITH PROPOFOL N/A 12/21/2019   Procedure: ESOPHAGOGASTRODUODENOSCOPY (EGD) WITH PROPOFOL;  Surgeon: [name], MD;  [facility];  Service: Endoscopy;  Laterality: N/A;  . ESOPHAGOGASTRODUODENOSCOPY (EGD) WITH PROPOFOL N/A 02/07/2020   Procedure: ESOPHAGOGASTRODUODENOSCOPY (EGD) WITH PROPOFOL;  Surgeon: [name], MD;  [facility];  Service: Endoscopy;  Laterality: N/A;  . ESOPHAGOGASTRODUODENOSCOPY (EGD) WITH PROPOFOL N/A 03/13/2020   Procedure: ESOPHAGOGASTRODUODENOSCOPY (EGD) WITH PROPOFOL;  Surgeon: [name], MD;  [facility]: [facility];  Service: Endoscopy;  Laterality: N/A;  With dilation  . LUNG SURGERY    . SAVORY DILATION N/A 10/19/2019   Procedure: SAVORY DILATION;  Surgeon: Yetta Flock, MD;  [facility]: [facility];  Service: Gastroenterology;  Laterality: N/A;  . SAVORY DILATION N/A 11/02/2019   Procedure: SAVORY  DILATION;  Surgeon: [name], MD;  [facility]: [facility];  Service: Endoscopy;  Laterality: N/A;  fluoro  . SAVORY DILATION N/A 11/16/2019   Procedure: SAVORY DILATION;  Surgeon: [name], MD;  [facility]: [facility];  Service: Endoscopy;  Laterality: N/A;  . SAVORY DILATION N/A 11/29/2019   Procedure: SAVORY DILATION;  Surgeon: [name], MD;  [facility]: [facility];  Service: Endoscopy;  Laterality: N/A;  . SAVORY DILATION N/A 12/21/2019   Procedure: SAVORY DILATION;  Surgeon: [name], MD;  [facility]: [facility];  Service: Endoscopy;  Laterality: N/A;  . SAVORY DILATION N/A 02/07/2020   Procedure: SAVORY DILATION;  Surgeon: [name], MD;  [facility]: [facility];  Service: Endoscopy;  Laterality: N/A;    Prior to Admission medications   Medication Sig Start Date End Date Taking? Authorizing Provider  albuterol (PROVENTIL HFA;VENTOLIN HFA) 108 (90 Base) MCG/ACT inhaler Inhale 2 puffs into the lungs every 6 (six) hours as needed for wheezing or shortness of breath.    Yes [provider]  albuterol (PROVENTIL) (2.5 MG/3ML) 0.083% nebulizer solution Take 2.5 mg by nebulization every 6 (six) hours as needed for wheezing or shortness of breath.   Yes [provider]  budesonide-formoterol (SYMBICORT) 160-4.5 MCG/ACT inhaler Inhale 1 puff into the lungs daily. Patient taking differently: Inhale 1 puff into the lungs in the morning and at bedtime.  12/21/19   [name], MD  Calcium Carbonate Antacid (CALCIUM CARBONATE, DOSED IN MG ELEMENTAL CALCIUM,) 1250 MG/5ML SUSP Take 1,250 mg of elemental calcium by mouth daily.    [provider]  Cholecalciferol (VITAMIN D3) LIQD Take 400 Units by mouth daily.    [provider]  ENSURE (ENSURE) Take 237 mLs by mouth 3 (three) times daily between meals.     [provider]  fluticasone (FLONASE) 50 MCG/ACT nasal spray Place 1 spray into both nostrils daily as needed for allergies or  rhinitis.    [provider]  mirtazapine (REMERON) 15 MG tablet Take 15 mg by mouth at bedtime. *Crushes Tablet*    [provider]  omeprazole (PRILOSEC) 40 MG capsule Take 40 mg by mouth in the morning and at bedtime.    [provider]  pantoprazole (PROTONIX) 40 MG tablet Take 1 tablet (40 mg total) by mouth 2 (two) times daily before a meal. Patient not taking: Reported on 01/26/2020 11/29/19   [name], MD  pregabalin (LYRICA) 150 MG capsule Take 150 mg by mouth at bedtime. Open capsule    [provider]  sucralfate (CARAFATE) 1 GM/10ML suspension Take 1 g by mouth in the morning, at noon, in the evening, and at bedtime.    [provider]  Tiotropium Bromide Monohydrate 2.5 MCG/ACT AERS Inhale 2 puffs into the lungs daily.     [provider]    Current Facility-Administered Medications  Medication Dose Route Frequency Provider Last Rate Last Admin  . fentaNYL (SUBLIMAZE) injection 12.5-50 mcg  12.5-50 mcg Intravenous Q2H PRN Swayze, Ava, DO   25 mcg at 04/07/20 0829  . heparin injection 5,000 Units  5,000 Units Subcutaneous Q8H Swayze, Ava, DO   5,000 Units at 04/07/20 0615  . lactated ringers infusion   Intravenous Continuous Swayze, Ava, DO 75 mL/hr at 04/07/20 0300 Rate Verify at 04/07/20 0300  . ondansetron (ZOFRAN) tablet 4 mg  4 mg Oral Q6H PRN Swayze, Ava, DO       Or  . ondansetron (ZOFRAN) injection 4 mg  4 mg Intravenous Q6H PRN Swayze, Ava, DO   4 mg at 04/07/20 0251    Allergies as of 04/06/2020 - Review Complete 04/06/2020  Allergen Reaction Noted  . Azithromycin Other (See Comments) 11/11/2016  . Boniva [ibandronic acid] Other (See Comments) 10/01/2019  . Gabapentin Other (See Comments) 11/11/2016    Family History  Problem Relation Age of Onset  . Diabetes Sister        x 3  . Cancer Sister 25       brain that metastized to bone, stomach and other organs  . Melanoma Maternal Aunt   . Heart attack  Father   . Pneumonia Brother   . Cancer Other 17       cancer in his chest - nephew  . Colon cancer Neg Hx   . Pancreatic cancer Neg Hx   . Rectal cancer Neg Hx   . Esophageal cancer Neg Hx     Social History   Socioeconomic History  . Marital status: Single    Spouse name: Not on file  . Number of children: Not on file  . Years of education: Not on file  . Highest education level: Not on file  Occupational History  . Not on file  Tobacco Use  . Smoking status: Former Smoker    Packs/day: 0.25    Years: 2.00    Pack years: 0.50    Types: Cigarettes  Quit date: 05/30/2019    Years since quitting: 0.8  . Smokeless tobacco: Never Used  . Tobacco comment: 2 months- quit date  Vaping Use  . Vaping Use: Never used  Substance and Sexual Activity  . Alcohol use: No  . Drug use: No  . Sexual activity: Never  Other Topics Concern  . Not on file  Social History Narrative  . Not on file   Social Determinants of Health   Financial Resource Strain:   . Difficulty of Paying Living Expenses: Not on file  Food Insecurity:   . Worried About Charity fundraiser in the Last Year: Not on file  . Ran Out of Food in the Last Year: Not on file  Transportation Needs:   . Lack of Transportation (Medical): Not on file  . Lack of Transportation (Non-Medical): Not on file  Physical Activity:   . Days of Exercise per Week: Not on file  . Minutes of Exercise per Session: Not on file  Stress:   . Feeling of Stress : Not on file  Social Connections:   . Frequency of Communication with Friends and Family: Not on file  . Frequency of Social Gatherings with Friends and Family: Not on file  . Attends Religious Services: Not on file  . Active Member of Clubs or Organizations: Not on file  . Attends Archivist Meetings: Not on file  . Marital Status: Not on file  Intimate Partner Violence:   . Fear of Current or Ex-Partner: Not on file  . Emotionally Abused: Not on file  .  Physically Abused: Not on file  . Sexually Abused: Not on file    Review of Systems: Pertinent positive and negative review of systems were noted in the above HPI section.  All other review of systems was otherwise negative. Physical Exam: Vital signs in last 24 hours: Temp:  [97.9 F (36.6 C)-98.6 F (37 C)] 98.3 F (36.8 C) (09/24 0624) Pulse Rate:  [97-116] 97 (09/24 0647) Resp:  [14-20] 14 (09/24 0624) BP: (137-171)/(91-147) 142/91 (09/24 0647) SpO2:  [95 %-100 %] 100 % (09/24 0624) Weight:  [30.7 kg] 30.7 kg (09/24 0241) Last BM Date: 04/06/20 General:   Alert,  Well-developed, well-nourished, pleasant and cooperative in NAD Head:  Normocephalic and atraumatic. Eyes:  Sclera clear, no icterus.   Conjunctiva pink. Ears:  Normal auditory acuity. Nose:  No deformity, discharge,  or lesions. Mouth:  No deformity or lesions.   Neck:  Supple; no masses or thyromegaly. Lungs:  Clear throughout to auscultation.   No wheezes, crackles, or rhonchi. Heart:  Regular rate and rhythm; no murmurs, clicks, rubs,  or gallops. Abdomen:  Soft,nontender, BS active,nonpalp mass or hsm.   Rectal:  Deferred  Msk:  Symmetrical without gross deformities. . Pulses:  Normal pulses noted. Extremities:  Without clubbing or edema. Neurologic:  Alert and  oriented x4;  grossly normal neurologically. Skin:  Intact without significant lesions or rashes.. Psych:  Alert and cooperative. Normal mood and affect.  Intake/Output from previous day: 09/23 0701 - 09/24 0700 In: 92.8 [I.V.:92.8] Out: -  Intake/Output this shift: No intake/output data recorded.  Lab Results: Recent Labs    04/06/20 1859 04/07/20 0230  WBC 10.0 12.7*  HGB 14.4 13.1  HCT 45.3 42.6  PLT 320 275   BMET Recent Labs    04/06/20 1859 04/07/20 0002 04/07/20 0230  NA 141  --  141  K 4.4  --  4.3  CL 94*  --  97*  CO2 27  --  27  GLUCOSE 85  --  105*  BUN 30*  --  27*  CREATININE 0.79 0.66 0.66  CALCIUM 9.3  --  8.7*    LFT Recent Labs    04/07/20 0230  PROT 6.7  ALBUMIN 3.9  AST 26  ALT 12  ALKPHOS 64  BILITOT 0.9   PT/INR Recent Labs    04/06/20 2130  LABPROT 13.4  INR 1.1   Hepatitis Panel No results for input(s): HEPBSAG, HCVAB, HEPAIGM, HEPBIGM in the last 72 hours.     IMPRESSION;  #63 64 year old African-American female with 4-day history of acute nausea vomiting abdominal pain and some diarrhea which is now improving.  Onset of illness 48 hours post initial Covid vaccine. Work-up thus far with CT shows distal esophageal wall thickening, a dilated stomach and dilated proximal duodenum to the level of the SMA.  Rule out duodenal/jejunal stricture, obstruction with secondary partial gastric outlet obstruction.  #2 history of acute esophageal necrosis February 2021 precipitated by sepsis.  Also treated for esophageal candidiasis at that same time. Subsequent development of multiple esophageal strictures requiring serial outpatient dilations over the past few months.  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  This was reported as a Whipple procedure however her current anatomy does not support that.  #4 status post left upper lobectomy 2014/15 for lung cancer.  Unclear if this was the same process responsible for the pancreatic lesion  #5 weight loss secondary to above 6.  COPD #7 history of hypertension  Plan; keep n.p.o. IV PPI twice daily Around-the-clock antiemetics and as needed analgesics Discussed with Dr. Boykin Nearing this morning.  Initial plan was for upper GI today.  We will hold on upper GI and plan to proceed with EGD/enteroscopy per Dr. Bryan Lemma this afternoon to further assess the proximal small bowel anatomy, rule out stricture/obstruction.  Will not plan to dilate esophagus today. She may need NG tube placed at the time of endoscopy to decompress her stomach. Patient says that her daughter has all of her medical records  from [place], Dr. Lucia Gaskins was going to discuss with the daughter and have her bring all the records to the hospital for review. Further plans pending findings at EGD this afternoon. Hold Subcu heparin until after procedures. Prealbumin has been ordered     Brena Windsor EsterwoodPA-C  04/07/2020, 8:56 AM

## 2020-04-07 NOTE — Interval H&P Note (Signed)
History and Physical Interval Note:  04/07/2020 4:23 PM  Claudia Wiley  has presented today for surgery, with the diagnosis of partial GOO, hx multiple esophageal strictures,r/o duodenal stricture.  The various methods of treatment have been discussed with the patient and family. After consideration of risks, benefits and other options for treatment, the patient has consented to  Procedure(s): ESOPHAGOGASTRODUODENOSCOPY (EGD) WITH PROPOFOL (N/A) ENTEROSCOPY (N/A) as a surgical intervention.  The patient's history has been reviewed, patient examined, no change in status, stable for surgery.  I have reviewed the patient's chart and labs.  Questions were answered to the patient's satisfaction.     Dominic Pea [place]

## 2020-04-07 NOTE — Consult Note (Signed)
[place] 1956/05/09  [id number].    Requesting MD: Dewaine Conger, MD Chief Complaint/Reason for Consult: nausea, vomiting / evaluate for SMA syndrome  HPI:  Claudia Wiley is a 64 yo female with a history of COPD, HTN, and esophageal stricture who presents with several days of nausea/vomiting and diarrhea. She says the vomiting began 2-3 days ago and is nonbloody and nonbilious. She endorses subjective chills but denies fevers. She presented to the ED this evening. Labs are unremarkable. CT scan shows a distended stomach and proximal duodenum, with concern for duodenal narrowing at the SMA. General surgery is consulted for possible SMA syndrome.  Of note, the patient has had significant dysphagia secondary to esophageal stricture (she previously had esophageal candidiasis in March 2021) for which she undergoes endoscopic dilation every few weeks. Her last dilation was on 8/30. EGD at that time noted a normal appearance of the first and second portions of the duodenum. She has had significant weight loss over the last few months and appears severely malnourished.  ROS: Review of Systems  Constitutional: Positive for chills, malaise/fatigue and weight loss. Negative for fever.  Respiratory: Negative for cough and wheezing.   Cardiovascular: Negative for chest pain and palpitations.  Gastrointestinal: Positive for abdominal pain, diarrhea, nausea and vomiting.  Musculoskeletal: Positive for back pain.  Skin: Negative for rash.  Neurological: Negative for dizziness and loss of consciousness.    Family History  Problem Relation Age of Onset  . Diabetes Sister        x 3  . Cancer Sister 70       brain that metastized to bone, stomach and other organs  . Melanoma Maternal Aunt   . Heart attack Father   . Pneumonia Brother   . Cancer Other 17       cancer in his chest - nephew  . Colon cancer Neg Hx   . Pancreatic cancer Neg Hx   . Rectal cancer Neg Hx   . Esophageal cancer Neg  Hx     Past Medical History:  Diagnosis Date  . Anemia   . Arthritis   . Cancer [facility]) 2013   Upper left lung and stomach cancer  . COPD (chronic obstructive pulmonary disease) ([place])   . Esophageal stricture   . Family history of brain cancer   . Family history of melanoma   . Gastrinoma   . Gastrinoma   . GERD (gastroesophageal reflux disease)   . Hypertension   . Osteoporosis   . Pancreatic insufficiency   . Pneumonia   . PONV (postoperative nausea and vomiting)   . Vitamin D deficiency   . 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 Her smoking use included cigarettes. She has a 0.50 pack-year smoking history. She has never used smokeless tobacco. She reports that she does not drink alcohol and does not use drugs.  Allergies:  Allergies  Allergen Reactions  . Azithromycin Other (See Comments)    Unknown   . Boniva [Ibandronic Acid] Other (See Comments)    unknown  . Gabapentin Other (See Comments)    unknown    (Not in a hospital admission)    Physical Exam: Blood pressure (!) 161/147, pulse (!) 116, temperature 98.6 F (37 C), temperature source Oral,  resp. rate 18, SpO2 100 %.  General: resting comfortably, NAD, cachectic HEENT: head is normocephalic, atraumatic.  Sclera are nonicteric.  PERRL.  Ears and nose without any masses or lesions. Heart: regular, rate, and rhythm.  Normal s1,s2. No obvious murmurs, gallops, or rubs noted. Lungs: CTAB, no wheezes, rhonchi, or rales noted.  Respiratory effort nonlabored on nasal cannula. Abd: soft, nondistended, nontender to palpation. Well-healed lower midline and upper abdominal chevron incisions. MS: all 4 extremities are symmetrical with no cyanosis, clubbing, or edema. Skin:  warm and dry with no masses, lesions, or rashes Neuro: Cranial nerves 2-12 grossly intact, no focal deficits. Psych: A&Ox3 with an appropriate affect.   Results for orders placed or performed during the hospital encounter of 04/06/20 (from the past 48 hour(s))  Urinalysis, Routine w reflex microscopic In/Out Cath Urine     Status: Abnormal   Collection Time: 04/06/20  6:30 PM  Result Value Ref Range   Color, Urine YELLOW YELLOW   APPearance CLEAR CLEAR   Specific Gravity, Urine 1.027 1.005 - 1.030   pH 5.0 5.0 - 8.0   Glucose, UA NEGATIVE NEGATIVE mg/dL   Hgb urine dipstick SMALL (A) NEGATIVE   Bilirubin Urine NEGATIVE NEGATIVE   Ketones, ur 80 (A) NEGATIVE mg/dL   Protein, ur 30 (A) NEGATIVE mg/dL   Nitrite NEGATIVE NEGATIVE   Leukocytes,Ua NEGATIVE NEGATIVE   RBC / HPF 0-5 0 - 5 RBC/hpf   WBC, UA 0-5 0 - 5 WBC/hpf   Bacteria, UA NONE SEEN NONE SEEN   Squamous Epithelial / LPF 0-5 0 - 5   Mucus PRESENT    Hyaline Casts, UA PRESENT     Comment: Performed at [facility], [place] [address] [place], [place] [address]  Comprehensive metabolic panel     Status: Abnormal   Collection Time: 04/06/20  6:59 PM  Result Value Ref Range   Sodium 141 135 - 145 mmol/L   Potassium 4.4 3.5 - 5.1 mmol/L   Chloride 94 (L) 98 - 111 mmol/L   CO2 27 22 - 32 mmol/L   Glucose, Bld 85 70 - 99 mg/dL    Comment: Glucose reference range applies only to samples taken after fasting for at least 8 hours.   BUN 30 (H) 8 - 23 mg/dL   Creatinine, Ser 0.79 0.44 - 1.00 mg/dL   Calcium 9.3 8.9 - 10.3 mg/dL   Total Protein 7.4 6.5 - 8.1 g/dL   Albumin 4.5 3.5 - 5.0 g/dL   AST 29 15 - 41 U/L   ALT 13 0 - 44 U/L   Alkaline Phosphatase 73 38 - 126 U/L   Total Bilirubin 1.1 0.3 - 1.2 mg/dL   GFR calc non Af Amer >60 >60 mL/min   GFR calc Af Amer >60 >60 mL/min   Anion gap 20 (H) 5 - 15    Comment: Performed at [facility], [place] [address], [place], [place] [address]  CBC      Status: Abnormal   Collection Time: 04/06/20  6:59 PM  Result Value Ref Range   WBC 10.0 4.0 - 10.5 K/uL   RBC 5.49 (H) 3.87 - 5.11 MIL/uL   Hemoglobin 14.4 12.0 - 15.0 g/dL   HCT 45.3 36 - 46 %   MCV 82.5 80.0 - 100.0 fL   MCH 26.2 26.0 - 34.0 pg   MCHC 31.8 30.0 - 36.0 g/dL   RDW 15.9 (H) 11.5 - 15.5 %   Platelets 320 150 -  400 K/uL   nRBC 0.0 0.0 - 0.2 %    Comment: Performed at [facility], [place] [address], [place], [place] [address]  Lactic acid, plasma     Status: None   Collection Time: 04/06/20  9:30 PM  Result Value Ref Range   Lactic Acid, Venous 1.6 0.5 - 1.9 mmol/L    Comment: Performed at [facility], [place] [address], [place], [place] [address]  Protime-INR     Status: None   Collection Time: 04/06/20  9:30 PM  Result Value Ref Range   Prothrombin Time 13.4 11.4 - 15.2 seconds   INR 1.1 0.8 - 1.2    Comment: (NOTE) INR goal varies based on device and disease states. Performed at [facility], [place] [address], [place], [place] [address]   APTT     Status: None   Collection Time: 04/06/20  9:30 PM  Result Value Ref Range   aPTT 33 24 - 36 seconds    Comment: Performed at [facility], [place] [address], [place], [place] [address]  Respiratory Panel by RT PCR (Flu A&B, Covid) - Nasopharyngeal Swab     Status: None   Collection Time: 04/06/20 10:26 PM   Specimen: Nasopharyngeal Swab  Result Value Ref Range   SARS Coronavirus 2 by RT PCR NEGATIVE NEGATIVE    Comment: (NOTE) SARS-CoV-2 target nucleic acids are NOT DETECTED.  The SARS-CoV-2 RNA is generally detectable in upper respiratoy specimens during the acute phase of infection. The lowest concentration of SARS-CoV-2 viral copies this assay can detect is 131 copies/mL. A negative result does not preclude SARS-Cov-2 infection and should not be used as the sole basis for treatment or other patient management decisions. A negative result  may occur with  improper specimen collection/handling, submission of specimen other than nasopharyngeal swab, presence of viral mutation(s) within the areas targeted by this assay, and inadequate number of viral copies (<131 copies/mL). A negative result must be combined with clinical observations, patient history, and epidemiological information. The expected result is Negative.  Fact Sheet for Patients:  PinkCheek.be  Fact Sheet for Healthcare Providers:  GravelBags.it  This test is no t yet approved or cleared by the [place] FDA and  has been authorized for detection and/or diagnosis of SARS-CoV-2 by FDA under an Emergency Use Authorization (EUA). This EUA will remain  in effect (meaning this test can be used) for the duration of the COVID-19 declaration under Section 564(b)(1) of the Act, 21 U.S.C. section 360bbb-3(b)(1), unless the authorization is terminated or revoked sooner.     Influenza A by PCR NEGATIVE NEGATIVE   Influenza B by PCR NEGATIVE NEGATIVE    Comment: (NOTE) The Xpert Xpress SARS-CoV-2/FLU/RSV assay is intended as an aid in  the diagnosis of influenza from Nasopharyngeal swab specimens and  should not be used as a sole basis for treatment. Nasal washings and  aspirates are unacceptable for Xpert Xpress SARS-CoV-2/FLU/RSV  testing.  Fact Sheet for Patients: PinkCheek.be  Fact Sheet for Healthcare Providers: GravelBags.it  This test is not yet approved or cleared by the [place] FDA and  has been authorized for detection and/or diagnosis of SARS-CoV-2 by  FDA under an Emergency Use Authorization (EUA). This EUA will remain  in effect (meaning this test can be used) for the duration of the  Covid-19 declaration under Section 564(b)(1) of the Act, 21  U.S.C. section 360bbb-3(b)(1), unless the authorization is  terminated or  revoked. Performed  at [facility], [place] [address], [place], [place] [address]    CT ABDOMEN PELVIS W CONTRAST  Result Date: 04/06/2020 CLINICAL DATA:  Abdominal pain EXAM: CT ABDOMEN AND PELVIS WITH CONTRAST TECHNIQUE: Multidetector CT imaging of the abdomen and pelvis was performed using the standard protocol following bolus administration of intravenous contrast. CONTRAST:  55m OMNIPAQUE IOHEXOL 300 MG/ML  SOLN COMPARISON:  09/09/2019 FINDINGS: Lower chest: The lung bases are clear. The heart size is normal. Hepatobiliary: The liver is normal. Normal gallbladder.There is no biliary ductal dilation. Pancreas: Normal contours without ductal dilatation. No peripancreatic fluid collection. Spleen: Unremarkable. Adrenals/Urinary Tract: --Adrenal glands: Unremarkable. --Right kidney/ureter: No hydronephrosis or radiopaque kidney stones. --Left kidney/ureter: No hydronephrosis or radiopaque kidney stones. --Urinary bladder: The urinary bladder is distended. Stomach/Bowel: --Stomach/Duodenum: There is esophageal wall thickening involving the distal esophagus. The stomach is distended. The proximal duodenum is distended to the level of the SMA. --Small bowel: There is no evidence for small bowel obstruction. --Colon: Unremarkable. --Appendix: Normal. Vascular/Lymphatic: Atherosclerotic calcification is present within the non-aneurysmal abdominal aorta, without hemodynamically significant stenosis. --No retroperitoneal lymphadenopathy. --No mesenteric lymphadenopathy. --No pelvic or inguinal lymphadenopathy. Reproductive: Status post hysterectomy. No adnexal mass. Other: No ascites or free air. There has been significant interval loss of subcutaneous fat since the patient's prior CT. Musculoskeletal. No acute displaced fractures. IMPRESSION: 1. Findings suggestive of developing SMA syndrome as detailed above. 2. Distended urinary bladder. Aortic Atherosclerosis (ICD10-I70.0). Electronically  Signed   By: CConstance HolsterM.D.   On: 04/06/2020 23:13   DG Chest Port 1 View  Result Date: 04/06/2020 CLINICAL DATA:  Shortness of breath, sepsis EXAM: PORTABLE CHEST 1 VIEW COMPARISON:  Chest x-ray 10/03/2019 chest x-ray 10/01/2019 FINDINGS: The heart size and mediastinal contours are within normal limits. Hyperinflation of the lungs suggestive of emphysema. Pulmonary sutures overlie the left hemithorax. Biapical pleural/pulmonary scarring again noted. No focal consolidation. No pulmonary edema. Blunting of the left costophrenic angle is again noted which may represent scarring versus trace pleural effusion. No pneumothorax. Well-defined symmetric bilateral lower/mid lung zone densities consistent with nipple shadows. No acute osseous abnormality. IMPRESSION: No active cardiopulmonary disease. Electronically Signed   By: MIven FinnM.D.   On: 04/06/2020 21:53      Assessment/Plan 64yo female with a history of COPD and severe malnutrition and weight loss secondary to dysphagia, presenting with nausea/vomiting, abdominal pain and diarrhea. CT scan shows gastric distension, however on review of prior scans the stomach was distended at that time as well, suggesting a chronic component. SMA syndrome is typically a diagnosis of exclusion. Recommend upper GI to assess for delayed gastric emptying and duodenal obstruction; if an obstruction is present the patient should have an EGD to rule out a mass lesion in the duodenum. If this is truly SMA syndrome the initial management is nutritional optimization to promote weight gain and increase the angle between the SMA and aorta. - Can attempt NG placement for gastric decompression, however do not force if resistance is met at esophageal stricture - IV fluid hydration - Upper GI after gastric decompression - Consider placement of a feeding tube given ongoing weight loss - No indication for acute surgical intervention at this time  SDwan Bolt  [facility] 04/07/2020, 12:38 AM Please see Amion for pager number during day hours 7:00am-4:30pm or 7:00am -11:30am on weekends

## 2020-04-07 NOTE — Anesthesia Postprocedure Evaluation (Signed)
Anesthesia Post Note  Patient: Gayleen Sholtz  Procedure(s) Performed: ESOPHAGOGASTRODUODENOSCOPY (EGD) WITH PROPOFOL (N/A ) ENTEROSCOPY (N/A ) ESOPHAGEAL DILITATION (N/A ) BIOPSY     Patient location during evaluation: PACU Anesthesia Type: MAC Level of consciousness: awake and alert Pain management: pain level controlled Vital Signs Assessment: post-procedure vital signs reviewed and stable Respiratory status: spontaneous breathing, nonlabored ventilation, respiratory function stable and patient connected to nasal cannula oxygen Cardiovascular status: stable and blood pressure returned to baseline Postop Assessment: no apparent nausea or vomiting Anesthetic complications: no   No complications documented.  Last Vitals:  Vitals:   04/07/20 1855 04/07/20 2000  BP: 130/72 118/78  Pulse: (!) 106 (!) 108  Resp: 17 14  Temp: 36.8 C 36.9 C  SpO2: 95% 95%    Last Pain:  Vitals:   04/07/20 2000  TempSrc: Oral  PainSc:                  Havanah Nelms S

## 2020-04-08 LAB — URINE CULTURE: Culture: NO GROWTH

## 2020-04-08 LAB — PREALBUMIN: Prealbumin: 6.3 mg/dL — ABNORMAL LOW (ref 18–38)

## 2020-04-08 MED ORDER — SUCRALFATE 1 GM/10ML PO SUSP
1.0000 g | Freq: Three times a day (TID) | ORAL | Status: DC
  Administered 2020-04-08 – 2020-04-19 (×33): 1 g
  Filled 2020-04-08 (×34): qty 10

## 2020-04-08 MED ORDER — PHENOL 1.4 % MT LIQD
1.0000 | OROMUCOSAL | Status: DC | PRN
  Filled 2020-04-08: qty 177

## 2020-04-08 MED ORDER — MORPHINE SULFATE (PF) 4 MG/ML IV SOLN
3.0000 mg | INTRAVENOUS | Status: DC | PRN
  Administered 2020-04-08 – 2020-04-11 (×19): 3 mg via INTRAVENOUS
  Filled 2020-04-08 (×19): qty 1

## 2020-04-08 NOTE — Progress Notes (Signed)
Progress Note for [facility]  Subjective: The patient complains about abdominal pain.  Objective: Vital signs in last 24 hours: Temp:  [98.3 F (36.8 C)-98.8 F (37.1 C)] 98.8 F (37.1 C) (09/25 0502) Pulse Rate:  [97-108] 102 (09/25 0502) Resp:  [12-20] 14 (09/25 0502) BP: (106-154)/(51-92) 107/69 (09/25 0502) SpO2:  [89 %-100 %] 92 % (09/25 0502) Last BM Date: 04/06/20  Intake/Output from previous day: 09/24 0701 - 09/25 0700 In: 922.3 [I.V.:922.3] Out: 500 [Urine:500] Intake/Output this shift: No intake/output data recorded.  General appearance: alert and weak Resp: clear to auscultation bilaterally Cardio: regular rate and rhythm GI: flat, tender to mild palpation Extremities: extremities normal, atraumatic, no cyanosis or edema  Lab Results: Recent Labs    04/06/20 1859 04/07/20 0230  WBC 10.0 12.7*  HGB 14.4 13.1  HCT 45.3 42.6  PLT 320 275   BMET Recent Labs    04/06/20 1859 04/07/20 0002 04/07/20 0230  NA 141  --  141  K 4.4  --  4.3  CL 94*  --  97*  CO2 27  --  27  GLUCOSE 85  --  105*  BUN 30*  --  27*  CREATININE 0.79 0.66 0.66  CALCIUM 9.3  --  8.7*   LFT Recent Labs    04/07/20 0230  PROT 6.7  ALBUMIN 3.9  AST 26  ALT 12  ALKPHOS 64  BILITOT 0.9   PT/INR Recent Labs    04/06/20 2130  LABPROT 13.4  INR 1.1   Hepatitis Panel No results for input(s): HEPBSAG, HCVAB, HEPAIGM, HEPBIGM in the last 72 hours. C-Diff No results for input(s): CDIFFTOX in the last 72 hours. Fecal Lactopherrin No results for input(s): FECLLACTOFRN in the last 72 hours.  Studies/Results: DG Abd 1 View - KUB  Result Date: 04/07/2020 CLINICAL DATA:  NG tube placement EXAM: ABDOMEN - 1 VIEW COMPARISON:  04/03/2019 FINDINGS: The enteric tube courses through the gastric body and terminates near the pylorus/antrum. The bowel gas pattern is nonspecific. IMPRESSION: Enteric tube projects near the pylorus/antrum. Electronically Signed   By: Constance Holster  M.D.   On: 04/07/2020 19:55   CT ABDOMEN PELVIS W CONTRAST  Result Date: 04/06/2020 CLINICAL DATA:  Abdominal pain EXAM: CT ABDOMEN AND PELVIS WITH CONTRAST TECHNIQUE: Multidetector CT imaging of the abdomen and pelvis was performed using the standard protocol following bolus administration of intravenous contrast. CONTRAST:  36mL OMNIPAQUE IOHEXOL 300 MG/ML  SOLN COMPARISON:  09/09/2019 FINDINGS: Lower chest: The lung bases are clear. The heart size is normal. Hepatobiliary: The liver is normal. Normal gallbladder.There is no biliary ductal dilation. Pancreas: Normal contours without ductal dilatation. No peripancreatic fluid collection. Spleen: Unremarkable. Adrenals/Urinary Tract: --Adrenal glands: Unremarkable. --Right kidney/ureter: No hydronephrosis or radiopaque kidney stones. --Left kidney/ureter: No hydronephrosis or radiopaque kidney stones. --Urinary bladder: The urinary bladder is distended. Stomach/Bowel: --Stomach/Duodenum: There is esophageal wall thickening involving the distal esophagus. The stomach is distended. The proximal duodenum is distended to the level of the SMA. --Small bowel: There is no evidence for small bowel obstruction. --Colon: Unremarkable. --Appendix: Normal. Vascular/Lymphatic: Atherosclerotic calcification is present within the non-aneurysmal abdominal aorta, without hemodynamically significant stenosis. --No retroperitoneal lymphadenopathy. --No mesenteric lymphadenopathy. --No pelvic or inguinal lymphadenopathy. Reproductive: Status post hysterectomy. No adnexal mass. Other: No ascites or free air. There has been significant interval loss of subcutaneous fat since the patient's prior CT. Musculoskeletal. No acute displaced fractures. IMPRESSION: 1. Findings suggestive of developing SMA syndrome as detailed above. 2. Distended urinary  bladder. Aortic Atherosclerosis (ICD10-I70.0). Electronically Signed   By: Constance Holster M.D.   On: 04/06/2020 23:13   DG Chest Port 1  View  Result Date: 04/06/2020 CLINICAL DATA:  Shortness of breath, sepsis EXAM: PORTABLE CHEST 1 VIEW COMPARISON:  Chest x-ray 10/03/2019 chest x-ray 10/01/2019 FINDINGS: The heart size and mediastinal contours are within normal limits. Hyperinflation of the lungs suggestive of emphysema. Pulmonary sutures overlie the left hemithorax. Biapical pleural/pulmonary scarring again noted. No focal consolidation. No pulmonary edema. Blunting of the left costophrenic angle is again noted which may represent scarring versus trace pleural effusion. No pneumothorax. Well-defined symmetric bilateral lower/mid lung zone densities consistent with nipple shadows. No acute osseous abnormality. IMPRESSION: No active cardiopulmonary disease. Electronically Signed   By: Iven Finn M.D.   On: 04/06/2020 21:53    Medications:  Scheduled: . heparin  5,000 Units Subcutaneous Q8H  . pantoprazole (PROTONIX) IV  40 mg Intravenous Q12H  . sucralfate  1 g Per Tube TID WC & HS   Continuous: . lactated ringers 75 mL/hr at 04/08/20 0941    Assessment/Plan: 1) Esophageal stricture. 2) Duodenal ulcerations. 3) SMA syndrome. 4) Abdominal pain. 5) Cachexia.   The patient is very frail and weak appearing.  No complications from the procedure yesterday.  Her gastrin level is in the 200 range, but this is not high enough for Z-E syndrome.  This is an expected level for being on a PPI.    Plan: 1) Agree with increasing pain medication. 2) Continue with PPI and sucralfate.  LOS: 1 day   Alisha Burgo D 04/08/2020, 1:26 PM

## 2020-04-08 NOTE — Progress Notes (Addendum)
PROGRESS NOTE   Claudia Wiley  LEX:[id number]    DOB: 01/23/1956    DOA: 04/06/2020  PCP: [facility]   I have briefly reviewed patients previous medical records in [facility].  Chief Complaint  Patient presents with  . Dehydration  . 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  General surgery and [facility] consulting.  S/p EGD, esophageal stricture dilatation, placement of NG tube during endoscopy on 9/24.   Assessment & Plan:  Active Problems:   Essential hypertension   Nausea & vomiting   Abdominal pain   GERD (gastroesophageal reflux disease)   Pancreatic insufficiency   COPD (chronic obstructive pulmonary disease) (HCC)   Esophageal stricture   Protein-calorie malnutrition, severe   SMAS (superior mesenteric artery syndrome) (HCC)   Gastric outlet obstruction   Acute onset of abdominal pain, nausea, vomiting, transient diarrhea and poor oral intake: Reportedly started approximately 48 hours after initial Covid vaccine.  Initial CT abdomen showed distal esophageal wall thickening, dilated stomach and dilated proximal duodenum to the level of the SMA.  Lactate normal.  S/p EGD 9/24, esophageal stricture dilatation, placement of NG tube, showed multiple duodenal ulcers which were biopsied and showed endoscopic appearance consistent with extraluminal compression by  the SMA.  Treatment of SMA syndrome would be to improve calorie intake and restore fat pad in between SMA and duodenum.  General surgery input appreciated.  No surgical interventions at this time.  Continue supportive treatment with NPO, NG tube to low wall suction, IV PPI twice daily, sucralfate slurry, uptitrated IV morphine for pain relief.  Monitor closely and hopefully if symptoms improve then could consider starting oral diet, otherwise will need to consider alternate feeding options i.e. GJ tube feeding or TNA.  I discussed with Dr. Benson Norway, GI.  PMH: Both GI and general surgery indicate that even though there are reports of her having Whipple's procedure, imaging studies and multiple endoscopies are not consistent with post Whipple anatomy.  Per outside records obtained by general surgery, op report from 02/11/2014 confirms exploratory laparotomy, lysis of adhesions, Kocher maneuver and resection of peripancreatic gastrinoma.    Chronic dysphagia/recurrent esophageal strictures: Recurrent benign esophageal strictures due to previous acute necrotic esophagus and Candida esophagitis.  Requiring multiple EGDs and dilatations.  Had EGD and dilatation of strictures again on 9/24.  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  COPD: No clinical bronchospasm.  Essential hypertension: Controlled today.  Body mass index is 13.67 kg/m.  Nutritional Status Nutrition Problem: Severe Malnutrition Etiology: chronic illness (COPD, recurrent esophageal strictures) Signs/Symptoms: percent weight loss, energy intake < or equal to 75% for > or equal to 1 month, severe fat depletion, severe muscle depletion Percent weight loss: 22 % (x 9 months) Interventions: Refer to RD note for recommendations  DVT prophylaxis: heparin injection 5,000 Units Start: 04/07/20 0600     Code Status: Full Code  Family Communication: Discussed in detail with patient's  daughter via phone, updated care and answered all questions. Disposition:  Status is: Inpatient  Remains inpatient appropriate because:Inpatient level of care appropriate due to severity of illness   Dispo: The patient is from: Home              Anticipated d/c is to: Home              Anticipated d/c date is: > 3 days              Patient currently is not medically stable to d/c.        Consultants:   General surgery [facility]  Procedures:   EGD 9/24 NG tube placed during EGD 9/24  Antimicrobials:    Anti-infectives (From admission, onward)   None        Subjective:  Ongoing significant diffuse abdominal pain, only slightly better compared to yesterday.  Denies any other complaints.  Objective:   Vitals:   04/07/20 1830 04/07/20 1855 04/07/20 2000 04/08/20 0502  BP: 107/78 130/72 118/78 107/69  Pulse: 97 (!) 106 (!) 108 (!) 102  Resp: 18 17 14 14   Temp:  98.3 F (36.8 C) 98.5 F (36.9 C) 98.8 F (37.1 C)  TempSrc:  Oral Oral Oral  SpO2: 100% 95% 95% 92%  Weight:      Height:        General exam: Middle-age female, small built, frail and chronically ill looking, was resting comfortably with her eyes closed when I walked into her room.  Not waking up does not appear as uncomfortable as she did yesterday prior to procedure. Respiratory system: Clear to auscultation. Respiratory effort normal. Cardiovascular system: S1 & S2 heard, RRR. No JVD, murmurs, rubs, gallops or clicks. No pedal edema.  Telemetry personally reviewed: Sinus rhythm. Gastrointestinal system: Abdomen is nondistended, diffuse mild tenderness but much less compared to yesterday, no guarding, rigidity or rebound. No organomegaly or masses felt. Normal bowel sounds heard.  NGT +. Central nervous system: Alert and oriented. No focal neurological deficits. Extremities: Symmetric 5 x 5 power. Skin: No rashes, lesions or ulcers Psychiatry: Judgement and insight appear normal. Mood & affect  appropriate.    Data Reviewed:   I have personally reviewed following labs and imaging studies   CBC: Recent Labs  Lab 04/06/20 1859 04/07/20 0230  WBC 10.0 12.7*  HGB 14.4 13.1  HCT 45.3 42.6  MCV 82.5 83.7  PLT 320 315    Basic Metabolic Panel: Recent Labs  Lab 04/06/20 1859 04/07/20 0002 04/07/20 0230  NA 141  --  141  K 4.4  --  4.3  CL 94*  --  97*  CO2 27  --  27  GLUCOSE 85  --  105*  BUN 30*  --  27*  CREATININE 0.79 0.66 0.66  CALCIUM 9.3  --  8.7*    Liver Function Tests: Recent Labs  Lab 04/06/20 1859 04/07/20 0230  AST 29 26  ALT 13 12  ALKPHOS 73 64  BILITOT 1.1 0.9  PROT 7.4 6.7  ALBUMIN 4.5 3.9    CBG: No results for input(s): GLUCAP in the last 168 hours.  Microbiology Studies:   Recent Results (from the past 240 hour(s))  Urine culture     Status: None   Collection Time: 04/06/20  9:30 PM   Specimen: In/Out Cath Urine  Result Value Ref Range Status   Specimen Description   Final    IN/OUT  CATH URINE Performed at [facility], [place] [address], [place], [place] [address]    Special Requests   Final    NONE Performed at [facility], [place] [address], [place], [place] [address]    Culture   Final    NO GROWTH Performed at [facility], [place] [address], [place], [place] [address]    Report Status 04/08/2020 FINAL  Final  Respiratory Panel by RT PCR (Flu A&B, Covid) - Nasopharyngeal Swab     Status: None   Collection Time: 04/06/20 10:26 PM   Specimen: Nasopharyngeal Swab  Result Value Ref Range Status   SARS Coronavirus 2 by RT PCR NEGATIVE NEGATIVE Final    Comment: (NOTE) SARS-CoV-2 target nucleic acids are NOT DETECTED.  The SARS-CoV-2 RNA is generally detectable in upper respiratoy specimens during the acute phase of infection. The lowest concentration of SARS-CoV-2 viral copies this assay can detect is 131 copies/mL. A negative result does not preclude SARS-Cov-2 infection and  should not be used as the sole basis for treatment or other patient management decisions. A negative result may occur with  improper specimen collection/handling, submission of specimen other than nasopharyngeal swab, presence of viral mutation(s) within the areas targeted by this assay, and inadequate number of viral copies (<131 copies/mL). A negative result must be combined with clinical observations, patient history, and epidemiological information. The expected result is Negative.  Fact Sheet for Patients:  PinkCheek.be  Fact Sheet for Healthcare Providers:  GravelBags.it  This test is no t yet approved or cleared by the [place] FDA and  has been authorized for detection and/or diagnosis of SARS-CoV-2 by FDA under an Emergency Use Authorization (EUA). This EUA will remain  in effect (meaning this test can be used) for the duration of the COVID-19 declaration under Section 564(b)(1) of the Act, 21 U.S.C. section 360bbb-3(b)(1), unless the authorization is terminated or revoked sooner.     Influenza A by PCR NEGATIVE NEGATIVE Final   Influenza B by PCR NEGATIVE NEGATIVE Final    Comment: (NOTE) The Xpert Xpress SARS-CoV-2/FLU/RSV assay is intended as an aid in  the diagnosis of influenza from Nasopharyngeal swab specimens and  should not be used as a sole basis for treatment. Nasal washings and  aspirates are unacceptable for Xpert Xpress SARS-CoV-2/FLU/RSV  testing.  Fact Sheet for Patients: PinkCheek.be  Fact Sheet for Healthcare Providers: GravelBags.it  This test is not yet approved or cleared by the [place] FDA and  has been authorized for detection and/or diagnosis of SARS-CoV-2 by  FDA under an Emergency Use Authorization (EUA). This EUA will remain  in effect (meaning this test can be used) for the duration of the  Covid-19 declaration  under Section 564(b)(1) of the Act, 21  U.S.C. section 360bbb-3(b)(1), unless the authorization is  terminated or revoked. Performed at [facility], [place] [address], [place], [place] [address]      Radiology Studies:  DG Abd 1 View - KUB  Result Date: 04/07/2020 CLINICAL DATA:  NG tube placement EXAM: ABDOMEN - 1 VIEW COMPARISON:  04/03/2019 FINDINGS: The enteric tube courses through the gastric body and terminates near the pylorus/antrum. The bowel gas pattern is nonspecific. IMPRESSION: Enteric tube projects near the pylorus/antrum. Electronically Signed   By: Constance Holster M.D.   On: 04/07/2020 19:55   CT ABDOMEN PELVIS W CONTRAST  Result Date: 04/06/2020 CLINICAL DATA:  Abdominal pain EXAM: CT ABDOMEN AND PELVIS WITH CONTRAST TECHNIQUE: Multidetector  CT imaging of the abdomen and pelvis was performed using the standard protocol following bolus administration of intravenous contrast. CONTRAST:  2mL OMNIPAQUE IOHEXOL 300 MG/ML  SOLN COMPARISON:  09/09/2019 FINDINGS: Lower chest: The lung bases are clear. The heart size is normal. Hepatobiliary: The liver is normal. Normal gallbladder.There is no biliary ductal dilation. Pancreas: Normal contours without ductal dilatation. No peripancreatic fluid collection. Spleen: Unremarkable. Adrenals/Urinary Tract: --Adrenal glands: Unremarkable. --Right kidney/ureter: No hydronephrosis or radiopaque kidney stones. --Left kidney/ureter: No hydronephrosis or radiopaque kidney stones. --Urinary bladder: The urinary bladder is distended. Stomach/Bowel: --Stomach/Duodenum: There is esophageal wall thickening involving the distal esophagus. The stomach is distended. The proximal duodenum is distended to the level of the SMA. --Small bowel: There is no evidence for small bowel obstruction. --Colon: Unremarkable. --Appendix: Normal. Vascular/Lymphatic: Atherosclerotic calcification is present within the non-aneurysmal abdominal aorta, without  hemodynamically significant stenosis. --No retroperitoneal lymphadenopathy. --No mesenteric lymphadenopathy. --No pelvic or inguinal lymphadenopathy. Reproductive: Status post hysterectomy. No adnexal mass. Other: No ascites or free air. There has been significant interval loss of subcutaneous fat since the patient's prior CT. Musculoskeletal. No acute displaced fractures. IMPRESSION: 1. Findings suggestive of developing SMA syndrome as detailed above. 2. Distended urinary bladder. Aortic Atherosclerosis (ICD10-I70.0). Electronically Signed   By: Constance Holster M.D.   On: 04/06/2020 23:13   DG Chest Port 1 View  Result Date: 04/06/2020 CLINICAL DATA:  Shortness of breath, sepsis EXAM: PORTABLE CHEST 1 VIEW COMPARISON:  Chest x-ray 10/03/2019 chest x-ray 10/01/2019 FINDINGS: The heart size and mediastinal contours are within normal limits. Hyperinflation of the lungs suggestive of emphysema. Pulmonary sutures overlie the left hemithorax. Biapical pleural/pulmonary scarring again noted. No focal consolidation. No pulmonary edema. Blunting of the left costophrenic angle is again noted which may represent scarring versus trace pleural effusion. No pneumothorax. Well-defined symmetric bilateral lower/mid lung zone densities consistent with nipple shadows. No acute osseous abnormality. IMPRESSION: No active cardiopulmonary disease. Electronically Signed   By: Iven Finn M.D.   On: 04/06/2020 21:53     Scheduled Meds:   . heparin  5,000 Units Subcutaneous Q8H  . pantoprazole (PROTONIX) IV  40 mg Intravenous Q12H  . sucralfate  1 g Per Tube TID WC & HS    Continuous Infusions:   . lactated ringers 75 mL/hr at 04/08/20 0941     LOS: 1 day     Vernell Leep, MD, [place], [facility]. [facility]    To contact the attending provider between 7A-7P or the covering provider during after hours 7P-7A, please log into the web site [email or web address] and access using universal [place] password for  that web site. If you do not have the password, please call the hospital operator.  04/08/2020, 12:57 PM

## 2020-04-08 NOTE — Progress Notes (Signed)
Assessment & Plan: HD#3 1.  Chronic dysphagia secondary to esophageal stricture             Esophageal necrosis, February 2021 during an episode of sepsis             Followed by [facility] with multiple esophageal dilatations for chronic esophageal stricture  Dilated yesterday by Dr. Bryan Lemma 2.  Multiple duodenal ulcers  IV Protonix, sucralfate 3.  SMA syndrome  Endoscopic and radiographic evidence of SMA syndrome  Unable to traverse endoscopically 3.  Cachectic/malnourished             Check prealbumin 4.  COPD             Prior LUL lobectomy March 2015 for lung cancer at the [facility] in [place], [place].             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  History of gastrinoma             She did not have a Whipple             On 02/11/2014 at the [facility] in [place], [place], by Dr. 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  They could not determine whether the neuroendocrine tumor was metastatic. No pancreatic tissue mentioned on path report. 6.  DVT prophylaxis - on SQ Heparin 7.  She got first shot of Covid vaccine on 03/30/2020              Active Problems:   Essential hypertension   Nausea & vomiting   Abdominal pain   GERD (gastroesophageal reflux disease)   Pancreatic insufficiency   COPD (chronic obstructive pulmonary disease) (HCC)   Esophageal stricture   Protein-calorie malnutrition, severe   SMAS (superior mesenteric artery syndrome) (HCC)        [name], MD       [facility]       Office: [phone]   Chief Complaint: Abdominal pain   Subjective: Patient in bed, complains of abdominal pain.  Discussed with nurse - just had pain Rx.  Objective: Vital signs in last 24 hours: Temp:  [98.3 F (36.8 C)-98.8 F (37.1 C)] 98.8 F  (37.1 C) (09/25 0502) Pulse Rate:  [97-108] 102 (09/25 0502) Resp:  [12-20] 14 (09/25 0502) BP: (106-154)/(51-96) 107/69 (09/25 0502) SpO2:  [89 %-100 %] 92 % (09/25 0502) Last BM Date: 04/06/20  Intake/Output from previous day: 09/24 0701 - 09/25 0700 In: 922.3 [I.V.:922.3] Out: 500 [Urine:500] Intake/Output this shift: No intake/output data recorded.  Physical Exam: HEENT - sclerae clear, mucous membranes moist Neck - soft Chest - clear bilaterally Cor - RRR Abdomen - soft, scaphoid; well healed wounds; NG with bilious output Ext - no edema, non-tender Neuro - alert & oriented, no focal deficits  Lab Results:  Recent Labs    04/06/20 1859 04/07/20 0230  WBC 10.0 12.7*  HGB 14.4 13.1  HCT 45.3 42.6  PLT 320 275   BMET Recent Labs    04/06/20 1859 04/06/20 1859 04/07/20 0002 04/07/20 0230  NA 141  --   --  141  K 4.4  --   --  4.3  CL 94*  --   --  97*  CO2 27  --   --  27  GLUCOSE 85  --   --  105*  BUN 30*  --   --  27*  CREATININE 0.79   < > 0.66 0.66  CALCIUM 9.3  --   --  8.7*   < > = values in this interval not displayed.   PT/INR Recent Labs    04/06/20 2130  LABPROT 13.4  INR 1.1   Comprehensive Metabolic Panel:    Component Value Date/Time   NA 141 04/07/2020 0230   NA 141 04/06/2020 1859   K 4.3 04/07/2020 0230   K 4.4 04/06/2020 1859   CL 97 (L) 04/07/2020 0230   CL 94 (L) 04/06/2020 1859   CO2 27 04/07/2020 0230   CO2 27 04/06/2020 1859   BUN 27 (H) 04/07/2020 0230   BUN 30 (H) 04/06/2020 1859   CREATININE 0.66 04/07/2020 0230   CREATININE 0.66 04/07/2020 0002   GLUCOSE 105 (H) 04/07/2020 0230   GLUCOSE 85 04/06/2020 1859   CALCIUM 8.7 (L) 04/07/2020 0230   CALCIUM 9.3 04/06/2020 1859   AST 26 04/07/2020 0230   AST 29 04/06/2020 1859   ALT 12 04/07/2020 0230   ALT 13 04/06/2020 1859   ALKPHOS 64 04/07/2020 0230   ALKPHOS 73 04/06/2020 1859   BILITOT 0.9 04/07/2020 0230   BILITOT 1.1 04/06/2020 1859   PROT 6.7 04/07/2020  0230   PROT 7.4 04/06/2020 1859   ALBUMIN 3.9 04/07/2020 0230   ALBUMIN 4.5 04/06/2020 1859    Studies/Results: DG Abd 1 View - KUB  Result Date: 04/07/2020 CLINICAL DATA:  NG tube placement EXAM: ABDOMEN - 1 VIEW COMPARISON:  04/03/2019 FINDINGS: The enteric tube courses through the gastric body and terminates near the pylorus/antrum. The bowel gas pattern is nonspecific. IMPRESSION: Enteric tube projects near the pylorus/antrum. Electronically Signed   By: Constance Holster M.D.   On: 04/07/2020 19:55   CT ABDOMEN PELVIS W CONTRAST  Result Date: 04/06/2020 CLINICAL DATA:  Abdominal pain EXAM: CT ABDOMEN AND PELVIS WITH CONTRAST TECHNIQUE: Multidetector CT imaging of the abdomen and pelvis was performed using the standard protocol following bolus administration of intravenous contrast. CONTRAST:  19mL OMNIPAQUE IOHEXOL 300 MG/ML  SOLN COMPARISON:  09/09/2019 FINDINGS: Lower chest: The lung bases are clear. The heart size is normal. Hepatobiliary: The liver is normal. Normal gallbladder.There is no biliary ductal dilation. Pancreas: Normal contours without ductal dilatation. No peripancreatic fluid collection. Spleen: Unremarkable. Adrenals/Urinary Tract: --Adrenal glands: Unremarkable. --Right kidney/ureter: No hydronephrosis or radiopaque kidney stones. --Left kidney/ureter: No hydronephrosis or radiopaque kidney stones. --Urinary bladder: The urinary bladder is distended. Stomach/Bowel: --Stomach/Duodenum: There is esophageal wall thickening involving the distal esophagus. The stomach is distended. The proximal duodenum is distended to the level of the SMA. --Small bowel: There is no evidence for small bowel obstruction. --Colon: Unremarkable. --Appendix: Normal. Vascular/Lymphatic: Atherosclerotic calcification is present within the non-aneurysmal abdominal aorta, without hemodynamically significant stenosis. --No retroperitoneal lymphadenopathy. --No mesenteric lymphadenopathy. --No pelvic or  inguinal lymphadenopathy. Reproductive: Status post hysterectomy. No adnexal mass. Other: No ascites or free air. There has been significant interval loss of subcutaneous fat since the patient's prior CT. Musculoskeletal. No acute displaced fractures. IMPRESSION: 1. Findings suggestive of developing SMA syndrome as detailed above. 2. Distended urinary bladder. Aortic Atherosclerosis (ICD10-I70.0). Electronically Signed   By: Constance Holster M.D.   On: 04/06/2020 23:13   DG Chest Port 1 View  Result Date: 04/06/2020 CLINICAL DATA:  Shortness of breath,  sepsis EXAM: PORTABLE CHEST 1 VIEW COMPARISON:  Chest x-ray 10/03/2019 chest x-ray 10/01/2019 FINDINGS: The heart size and mediastinal contours are within normal limits. Hyperinflation of the lungs suggestive of emphysema. Pulmonary sutures overlie the left hemithorax. Biapical pleural/pulmonary scarring again noted. No focal consolidation. No pulmonary edema. Blunting of the left costophrenic angle is again noted which may represent scarring versus trace pleural effusion. No pneumothorax. Well-defined symmetric bilateral lower/mid lung zone densities consistent with nipple shadows. No acute osseous abnormality. IMPRESSION: No active cardiopulmonary disease. Electronically Signed   By: Iven Finn M.D.   On: 04/06/2020 21:53      [name] 04/08/2020  Patient ID: Claudia Wiley, female   DOB: 1955/11/04, 64 y.o.   MRN: [id number]

## 2020-04-09 ENCOUNTER — Inpatient Hospital Stay: Payer: Self-pay

## 2020-04-09 LAB — COMPREHENSIVE METABOLIC PANEL
ALT: 12 U/L (ref 0–44)
AST: 23 U/L (ref 15–41)
Albumin: 3.1 g/dL — ABNORMAL LOW (ref 3.5–5.0)
Alkaline Phosphatase: 43 U/L (ref 38–126)
Anion gap: 9 (ref 5–15)
BUN: 17 mg/dL (ref 8–23)
CO2: 32 mmol/L (ref 22–32)
Calcium: 8.7 mg/dL — ABNORMAL LOW (ref 8.9–10.3)
Chloride: 98 mmol/L (ref 98–111)
Creatinine, Ser: 0.47 mg/dL (ref 0.44–1.00)
GFR calc Af Amer: >60 mL/min (ref >60)
GFR calc non Af Amer: >60 mL/min (ref >60)
Glucose, Bld: 61 mg/dL — ABNORMAL LOW (ref 70–99)
Potassium: 3.8 mmol/L (ref 3.5–5.1)
Sodium: 139 mmol/L (ref 135–145)
Total Bilirubin: 0.7 mg/dL (ref 0.3–1.2)
Total Protein: 5.3 g/dL — ABNORMAL LOW (ref 6.5–8.1)

## 2020-04-09 LAB — CBC
HCT: 34.4 % — ABNORMAL LOW (ref 36.0–46.0)
Hemoglobin: 10.4 g/dL — ABNORMAL LOW (ref 12.0–15.0)
MCH: 26.7 pg (ref 26.0–34.0)
MCHC: 30.2 g/dL (ref 30.0–36.0)
MCV: 88.4 fL (ref 80.0–100.0)
Platelets: 180 10*3/uL (ref 150–400)
RBC: 3.89 MIL/uL (ref 3.87–5.11)
RDW: 16.4 % — ABNORMAL HIGH (ref 11.5–15.5)
WBC: 8.1 10*3/uL (ref 4.0–10.5)
nRBC: 0 % (ref 0.0–0.2)

## 2020-04-09 LAB — GLUCOSE, CAPILLARY
Glucose-Capillary: 56 mg/dL — ABNORMAL LOW (ref 70–99)
Glucose-Capillary: 158 mg/dL — ABNORMAL HIGH (ref 70–99)
Glucose-Capillary: 127 mg/dL — ABNORMAL HIGH (ref 70–99)
Glucose-Capillary: 134 mg/dL — ABNORMAL HIGH (ref 70–99)

## 2020-04-09 MED ORDER — SODIUM CHLORIDE 0.9% FLUSH: 10.0000 mL | INTRAVENOUS | Status: DC | PRN

## 2020-04-09 MED ORDER — CHLORHEXIDINE GLUCONATE CLOTH 2 % EX PADS
6.0000 | MEDICATED_PAD | Freq: Every day | CUTANEOUS | Status: DC
  Administered 2020-04-10 – 2020-04-19 (×10): 6 via TOPICAL

## 2020-04-09 MED ORDER — SODIUM CHLORIDE 0.9% FLUSH
10.0000 mL | Freq: Two times a day (BID) | INTRAVENOUS | Status: DC
  Administered 2020-04-09 – 2020-04-11 (×5): 10 mL
  Administered 2020-04-12: 30 mL
  Administered 2020-04-12 – 2020-04-16 (×4): 10 mL
  Administered 2020-04-17 (×2): 20 mL
  Administered 2020-04-18 (×2): 10 mL
  Administered 2020-04-19: 20 mL

## 2020-04-09 MED ORDER — DIPHENHYDRAMINE HCL 50 MG/ML IJ SOLN
25.0000 mg | Freq: Four times a day (QID) | INTRAMUSCULAR | Status: DC | PRN
  Administered 2020-04-09: 25 mg via INTRAVENOUS
  Filled 2020-04-09: qty 1

## 2020-04-09 MED ORDER — LIP MEDEX EX OINT
TOPICAL_OINTMENT | CUTANEOUS | Status: AC
  Filled 2020-04-09: qty 7

## 2020-04-09 MED ORDER — FAMOTIDINE IN NACL 20-0.9 MG/50ML-% IV SOLN
20.0000 mg | Freq: Two times a day (BID) | INTRAVENOUS | Status: DC
  Administered 2020-04-09 – 2020-04-11 (×5): 20 mg via INTRAVENOUS
  Filled 2020-04-09 (×5): qty 50

## 2020-04-09 MED ORDER — METHYLPREDNISOLONE SODIUM SUCC 125 MG IJ SOLR
60.0000 mg | Freq: Two times a day (BID) | INTRAMUSCULAR | Status: DC
  Administered 2020-04-09 – 2020-04-10 (×3): 60 mg via INTRAVENOUS
  Filled 2020-04-09 (×3): qty 2

## 2020-04-09 MED ORDER — DEXTROSE 50 % IV SOLN
INTRAVENOUS | Status: AC
  Administered 2020-04-09: 50 mL
  Filled 2020-04-09: qty 50

## 2020-04-09 MED ORDER — DIPHENHYDRAMINE HCL 50 MG/ML IJ SOLN
12.5000 mg | Freq: Four times a day (QID) | INTRAMUSCULAR | Status: DC | PRN
  Administered 2020-04-09 – 2020-04-10 (×3): 12.5 mg via INTRAVENOUS
  Filled 2020-04-09 (×3): qty 1

## 2020-04-09 MED ORDER — DIPHENHYDRAMINE HCL 50 MG/ML IJ SOLN: 12.5000 mg | Freq: Four times a day (QID) | INTRAMUSCULAR | Status: DC | PRN

## 2020-04-09 MED ORDER — POTASSIUM CHLORIDE 2 MEQ/ML IV SOLN
INTRAVENOUS | Status: AC
  Filled 2020-04-09 (×3): qty 1000

## 2020-04-09 NOTE — Progress Notes (Signed)
Peripherally Inserted Central Catheter Placement  The IV Nurse has discussed with the patient and/or persons authorized to consent for the patient, the purpose of this procedure and the potential benefits and risks involved with this procedure.  The benefits include less needle sticks, lab draws from the catheter, and the patient may be discharged home with the catheter. Risks include, but not limited to, infection, bleeding, blood clot (thrombus formation), and puncture of an artery; nerve damage and irregular heartbeat and possibility to perform a PICC exchange if needed/ordered by physician.  Alternatives to this procedure were also discussed.  Bard Power PICC patient education guide, fact sheet on infection prevention and patient information card has been provided to patient /or left at bedside.  Dtr signed consent. Pt lethargic, but gave verbal consent for PICC placement.  PICC Placement Documentation  PICC Double Lumen 04/09/20 PICC Right Brachial 35 cm 0 cm (Active)  Indication for Insertion or Continuance of Line Administration of hyperosmolar/irritating solutions (i.e. TPN, Vancomycin, etc.) 04/09/20 1700  Exposed Catheter (cm) 0 cm 04/09/20 1700  Site Assessment Clean;Dry;Intact 04/09/20 1700  Lumen #1 Status Flushed;Saline locked;Blood return noted 04/09/20 1700  Lumen #2 Status Flushed;Saline locked;Blood return noted 04/09/20 1700  Dressing Type Transparent 04/09/20 1700  Dressing Status Clean;Dry;Intact 04/09/20 1700  Antimicrobial disc in place? Yes 04/09/20 1700  Safety Lock Not Applicable 70/92/95 7473  Line Care Connections checked and tightened 04/09/20 1700  Line Adjustment (NICU/IV Team Only) No 04/09/20 1700  Dressing Intervention New dressing 04/09/20 1700  Dressing Change Due 04/16/20 04/09/20 1700       Rolena Infante 04/09/2020, 5:01 PM

## 2020-04-09 NOTE — Progress Notes (Signed)
Assessment & Plan: HD#3 1. Chronic dysphagia secondary to esophageal stricture Esophageal necrosis, February 2021 during an episode of sepsis Followed by [facility] with multiple esophageal dilatations for chronic esophageal stricture             Dilated Friday 9/24 - Dr. Bryan Lemma 2. Multiple duodenal ulcers             IV Protonix, sucralfate - per GI 3.  SMA syndrome             Endoscopic and radiographic evidence of SMA syndrome             Unable to traverse endoscopically 3. Cachectic/malnourished Needs to start on TNA for nutritional support 4. COPD Prior LUL lobectomyMarch2015 for lung cancerat the [facility] in [place], [place]. 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 History of gastrinoma No anatomic evidence or medical record of Whipple procedure On 02/11/2014 at the [facility] in [place], [place], by Dr. Juliane Poot, she had an exp lap, lysis of adhesions, Kocher maneuver, resection of peripancreatic gastrinoma.  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 They could not determine whether the neuroendocrine tumor was metastatic.No pancreatic tissue mentioned on path report.  Active Problems: Essential hypertension Nausea &vomiting Abdominal pain GERD (gastroesophageal reflux disease) Pancreatic insufficiency COPD (chronic obstructive pulmonary disease) (HCC) Esophageal stricture Protein-calorie malnutrition, severe SMAS (superior mesenteric artery syndrome) ([name])  Patient would benefit from starting nutritional support with TNA.  Doubt that a jejunostomy feeding tube or GJ tube could be successfully placed given the endoscopic findings on Friday.  Would recommend starting PICC line and TNA per pharmacy.  Patient may be a candidate for surgical jejunostomy tube placement or  possible gastrojejunostomy for management of SMA syndrome once malnutrition is addressed and corrected.  Will discuss with Dr. Kaylyn Lim who will be our [facility] hospital physician this week.        [name], MD       [facility]       Office: [phone]   Chief Complaint: Abdominal pain, SMA syndrome, esophageal stricture, peptic ulcer disease  Subjective: Patient up at bedside with nursing, bathing.  Complains of abdominal pain and pain from NG tube.  Objective: Vital signs in last 24 hours: Temp:  [98 F (36.7 C)-98.6 F (37 C)] 98 F (36.7 C) (09/26 0518) Pulse Rate:  [94-105] 94 (09/26 0518) Resp:  [14] 14 (09/26 0518) BP: (101-112)/(64-75) 101/64 (09/26 0518) SpO2:  [90 %-96 %] 96 % (09/26 0518) Last BM Date: 04/06/20  Intake/Output from previous day: 09/25 0701 - 09/26 0700 In: -  Out: 400 [Urine:400] Intake/Output this shift: No intake/output data recorded.  Physical Exam: HEENT - sclerae clear, mucous membranes moist Neck - soft Abdomen - NG with bilious Ext - no edema, non-tender Neuro - alert & oriented, no focal deficits  Lab Results:  Recent Labs    04/07/20 0230 04/09/20 0637  WBC 12.7* 8.1  HGB 13.1 10.4*  HCT 42.6 34.4*  PLT 275 180   BMET Recent Labs    04/07/20 0230 04/09/20 0637  NA 141 139  K 4.3 3.8  CL 97* 98  CO2 27 32  GLUCOSE 105* 61*  BUN 27* 17  CREATININE 0.66 0.47  CALCIUM 8.7* 8.7*   PT/INR Recent Labs    04/06/20 2130  LABPROT 13.4  INR 1.1   Comprehensive Metabolic Panel:  Component Value Date/Time   NA 139 04/09/2020 0637   NA 141 04/07/2020 0230   K 3.8 04/09/2020 0637   K 4.3 04/07/2020 0230   CL 98 04/09/2020 0637   CL 97 (L) 04/07/2020 0230   CO2 32 04/09/2020 0637   CO2 27 04/07/2020 0230   BUN 17 04/09/2020 0637   BUN 27 (H) 04/07/2020 0230   CREATININE 0.47 04/09/2020 0637   CREATININE 0.66 04/07/2020 0230   GLUCOSE 61 (L) 04/09/2020 0637   GLUCOSE 105 (H) 04/07/2020  0230   CALCIUM 8.7 (L) 04/09/2020 0637   CALCIUM 8.7 (L) 04/07/2020 0230   AST 23 04/09/2020 0637   AST 26 04/07/2020 0230   ALT 12 04/09/2020 0637   ALT 12 04/07/2020 0230   ALKPHOS 43 04/09/2020 0637   ALKPHOS 64 04/07/2020 0230   BILITOT 0.7 04/09/2020 0637   BILITOT 0.9 04/07/2020 0230   PROT 5.3 (L) 04/09/2020 0637   PROT 6.7 04/07/2020 0230   ALBUMIN 3.1 (L) 04/09/2020 0637   ALBUMIN 3.9 04/07/2020 0230    Studies/Results: DG Abd 1 View - KUB  Result Date: 04/07/2020 CLINICAL DATA:  NG tube placement EXAM: ABDOMEN - 1 VIEW COMPARISON:  04/03/2019 FINDINGS: The enteric tube courses through the gastric body and terminates near the pylorus/antrum. The bowel gas pattern is nonspecific. IMPRESSION: Enteric tube projects near the pylorus/antrum. Electronically Signed   By: Constance Holster M.D.   On: 04/07/2020 19:55      [name] 04/09/2020  Patient ID: Claudia Wiley, female   DOB: 02/22/1956, 64 y.o.   MRN: [id number]

## 2020-04-09 NOTE — Progress Notes (Addendum)
PROGRESS NOTE   Claudia Wiley  JYN:[id number]    DOB: April 05, 1956    DOA: 04/06/2020  PCP: [facility]   I have briefly reviewed patients previous medical records in [facility].  Chief Complaint  Patient presents with  . Dehydration  . 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  General surgery and [facility] consulting.  S/p EGD, esophageal stricture dilatation, placement of NG tube during endoscopy on 9/24.  Ordered for PICC line to start TPN.   Assessment & Plan:  Active Problems:   Essential hypertension   Nausea & vomiting   Abdominal pain   GERD (gastroesophageal reflux disease)   Pancreatic insufficiency   COPD (chronic obstructive pulmonary disease) (HCC)   Esophageal stricture   Protein-calorie malnutrition, severe   SMAS (superior mesenteric artery syndrome) (HCC)   Gastric outlet obstruction   Acute onset of abdominal pain, nausea, vomiting, transient diarrhea and poor oral intake: Reportedly started approximately 48 hours after initial Covid vaccine.  Initial CT abdomen showed distal esophageal wall thickening, dilated stomach and dilated proximal duodenum to the level of the SMA.  Lactate normal.  S/p EGD 9/24, esophageal stricture dilatation, placement of NG tube, showed multiple duodenal ulcers which were biopsied and showed endoscopic appearance  consistent with extraluminal compression by the SMA.  Treatment of SMA syndrome would be to improve calorie intake and restore fat pad in between SMA and duodenum.  General surgery input appreciated.  No surgical interventions at this time.  Continue supportive treatment with NPO, NG tube to low wall suction, IV PPI twice daily, sucralfate slurry, uptitrated IV morphine for pain relief.  I discussed with Dr. Harlow Asa and Dr. Benson Norway on 9/26, ordered PICC line to start TPN to improve nutritional status.  PMH: Both GI and general surgery indicate that even though there are reports of her having Whipple's procedure, imaging studies and multiple endoscopies are not consistent with post Whipple anatomy.  Per outside records obtained by general surgery, op report from 02/11/2014 confirms exploratory laparotomy, lysis of adhesions, Kocher maneuver and resection of peripancreatic gastrinoma.    Hypoglycemia: Noted on BMP this morning.  Confirmed by CBG.  Changed IV fluids to D5 infusion.  Monitor CBGs closely and treat per hypoglycemia protocol.  Pruritus and tongue swelling: Reported by patient, family and patient's RN.  Reportedly having diffuse itching.?  Tongue swelling without thrush.  Unclear etiology.  Unsure if this is an allergic reaction.  No new medications started today.  Started IV Solu-Medrol, Pepcid and as needed IV Benadryl.  Monitor closely and if persists, recurs or worsens then will have to look through her med list for possible etiology.  None obvious at this time except the morphine.  Examined patient at bedside.  Tongue is mildly diffusely swollen without inflammation.  DD: Chronic finding?  Glossitis from nutritional deficiency,?  Trauma during EGD,?  Allergic reaction although no new medications started since Friday.  Continue above  regimen, decreased IV Benadryl from 25-12.5 every 6 hourly as needed due to sedation.  Advised RN to closely monitor, especially for upper airway  symptoms.  Anemia: Hemoglobin is dropped from 13.1-10.4 in the absence of overt bleeding.?  Dilutional.  Follow CBC now and in a.m.  Chronic dysphagia/recurrent esophageal strictures: Recurrent benign esophageal strictures due to previous acute necrotic esophagus and Candida esophagitis.  Requiring multiple EGDs and dilatations.  Had EGD and dilatation of strictures again on 9/24.  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  COPD: No clinical bronchospasm.  Essential hypertension: Controlled today.  Body mass index is 13.67 kg/m.  Nutritional Status Nutrition Problem: Severe Malnutrition Etiology: chronic illness (COPD, recurrent esophageal strictures) Signs/Symptoms: percent weight loss, energy intake < or equal to 75% for > or equal to 1 month, severe fat depletion, severe muscle depletion Percent weight loss: 22 % (x 9 months) Interventions: Refer to RD note for recommendations  DVT prophylaxis: heparin injection 5,000 Units Start: 04/07/20 0600     Code Status: Full Code  Family Communication: Discussed in detail with patient's daughter via phone on 9/25, updated care and answered all questions. Disposition:  Status is: Inpatient  Remains inpatient appropriate because:Inpatient level of care appropriate due to severity of illness   Dispo: The patient is from: Home              Anticipated d/c is to: Home              Anticipated d/c date is: > 3 days              Patient currently is not medically stable to d/c.        Consultants:   General surgery [facility]  Procedures:   EGD 9/24 NG tube placed during EGD 9/24  Antimicrobials:    Anti-infectives (From admission, onward)   None        Subjective:  Patient states that her abdominal pain may be slightly better.  She did not have any symptoms pertaining to her tongue when I saw her in the morning.  Objective:   Vitals:   04/08/20 1500 04/08/20  2023 04/09/20 0518 04/09/20 1311  BP: 112/75 108/66 101/64 127/77  Pulse: (!) 105 (!) 101 94 96  Resp: 14  14 16   Temp: 98.3 F (36.8 C) 98.6 F (37 C) 98 F (36.7 C)   TempSrc: Oral Oral Oral   SpO2: 90% 92% 96% 100%  Weight:      Height:        General exam: Middle-age female, small built, frail and chronically ill looking, lying uncomfortably in bed. Respiratory system: Clear to auscultation.  No increased work of breathing. Cardiovascular system: S1 & S2 heard, RRR. No JVD, murmurs, rubs, gallops or clicks. No pedal edema.  Telemetry personally reviewed: Sinus rhythm. Gastrointestinal system: Abdomen is nondistended, diffuse mild tenderness but much less compared to yesterday, no guarding, rigidity or rebound. No organomegaly or masses felt. Normal bowel sounds heard.  NGT +. Central nervous system: Alert and oriented. No focal neurological deficits. Extremities: Symmetric 5 x 5 power. Skin: No rashes, lesions or ulcers Psychiatry: Judgement and insight appear normal. Mood & affect appropriate.  ENT: Tongue diffusely mildly swollen without redness or tenderness.  No thrush.  Able to move her tongue within the mouth and protrude it out as well.  No wheezing or stridor appreciated.    Data Reviewed:   I have personally  reviewed following labs and imaging studies   CBC: Recent Labs  Lab 04/06/20 1859 04/07/20 0230 04/09/20 0637  WBC 10.0 12.7* 8.1  HGB 14.4 13.1 10.4*  HCT 45.3 42.6 34.4*  MCV 82.5 83.7 88.4  PLT 320 275 967    Basic Metabolic Panel: Recent Labs  Lab 04/06/20 1859 04/06/20 1859 04/07/20 0002 04/07/20 0230 04/09/20 0637  NA 141  --   --  141 139  K 4.4  --   --  4.3 3.8  CL 94*  --   --  97* 98  CO2 27  --   --  27 32  GLUCOSE 85  --   --  105* 61*  BUN 30*  --   --  27* 17  CREATININE 0.79   < > 0.66 0.66 0.47  CALCIUM 9.3  --   --  8.7* 8.7*   < > = values in this interval not displayed.    Liver Function Tests: Recent Labs  Lab  04/06/20 1859 04/07/20 0230 04/09/20 0637  AST 29 26 23   ALT 13 12 12   ALKPHOS 73 64 43  BILITOT 1.1 0.9 0.7  PROT 7.4 6.7 5.3*  ALBUMIN 4.5 3.9 3.1*    CBG: Recent Labs  Lab 04/09/20 1001 04/09/20 1035 04/09/20 1206  GLUCAP 56* 158* 127*    Microbiology Studies:   Recent Results (from the past 240 hour(s))  Blood culture (routine single)     Status: None (Preliminary result)   Collection Time: 04/06/20  9:30 PM   Specimen: BLOOD  Result Value Ref Range Status   Specimen Description   Final    BLOOD LEFT ANTECUBITAL Performed at [facility], [place] [address] [place], [place] [address]    Special Requests   Final    BOTTLES DRAWN AEROBIC AND ANAEROBIC Blood Culture results may not be optimal due to an excessive volume of blood received in culture bottles Performed at [facility] [address], [place], [place] [address]    Culture   Final    NO GROWTH 2 DAYS Performed at [facility] [address], [place], [place] [address]    Report Status PENDING  Incomplete  Urine culture     Status: None   Collection Time: 04/06/20  9:30 PM   Specimen: In/Out Cath Urine  Result Value Ref Range Status   Specimen Description   Final    IN/OUT CATH URINE Performed at [facility] [address] [place], [place] [address]    Special Requests   Final    NONE Performed at [facility], [place] [address], [place], [place] [address]    Culture   Final    NO GROWTH Performed at [facility], [place] [address] [place],  [address]    Report Status 04/08/2020 FINAL  Final  Respiratory Panel by RT PCR (Flu A&B, Covid) - Nasopharyngeal Swab     Status: None   Collection Time: 04/06/20 10:26 PM   Specimen: Nasopharyngeal Swab  Result Value Ref Range Status   SARS Coronavirus 2 by RT PCR NEGATIVE NEGATIVE Final    Comment: (NOTE) SARS-CoV-2 target nucleic acids are NOT DETECTED.  The  SARS-CoV-2 RNA is generally detectable in upper respiratoy specimens during the acute phase of infection. The lowest concentration of SARS-CoV-2 viral copies this assay can detect is 131 copies/mL. A negative result does not preclude SARS-Cov-2 infection and should not be used as  the sole basis for treatment or other patient management decisions. A negative result may occur with  improper specimen collection/handling, submission of specimen other than nasopharyngeal swab, presence of viral mutation(s) within the areas targeted by this assay, and inadequate number of viral copies (<131 copies/mL). A negative result must be combined with clinical observations, patient history, and epidemiological information. The expected result is Negative.  Fact Sheet for Patients:  PinkCheek.be  Fact Sheet for Healthcare Providers:  GravelBags.it  This test is no t yet approved or cleared by the [place] FDA and  has been authorized for detection and/or diagnosis of SARS-CoV-2 by FDA under an Emergency Use Authorization (EUA). This EUA will remain  in effect (meaning this test can be used) for the duration of the COVID-19 declaration under Section 564(b)(1) of the Act, 21 U.S.C. section 360bbb-3(b)(1), unless the authorization is terminated or revoked sooner.     Influenza A by PCR NEGATIVE NEGATIVE Final   Influenza B by PCR NEGATIVE NEGATIVE Final    Comment: (NOTE) The Xpert Xpress SARS-CoV-2/FLU/RSV assay is intended as an aid in  the diagnosis of influenza from Nasopharyngeal swab specimens and  should not be used as a sole basis for treatment. Nasal washings and  aspirates are unacceptable for Xpert Xpress SARS-CoV-2/FLU/RSV  testing.  Fact Sheet for Patients: PinkCheek.be  Fact Sheet for Healthcare Providers: GravelBags.it  This test is not yet approved or cleared  by the [place] FDA and  has been authorized for detection and/or diagnosis of SARS-CoV-2 by  FDA under an Emergency Use Authorization (EUA). This EUA will remain  in effect (meaning this test can be used) for the duration of the  Covid-19 declaration under Section 564(b)(1) of the Act, 21  U.S.C. section 360bbb-3(b)(1), unless the authorization is  terminated or revoked. Performed at [facility], [place] [address], [place], [place] [address]      Radiology Studies:  DG Abd 1 View - KUB  Result Date: 04/07/2020 CLINICAL DATA:  NG tube placement EXAM: ABDOMEN - 1 VIEW COMPARISON:  04/03/2019 FINDINGS: The enteric tube courses through the gastric body and terminates near the pylorus/antrum. The bowel gas pattern is nonspecific. IMPRESSION: Enteric tube projects near the pylorus/antrum. Electronically Signed   By: Constance Holster M.D.   On: 04/07/2020 19:55   [place] EKG SITE RITE  Result Date: 04/09/2020 If Site Rite image not attached, placement could not be confirmed due to current cardiac rhythm.    Scheduled Meds:   . heparin  5,000 Units Subcutaneous Q8H  . lip balm      . methylPREDNISolone (SOLU-MEDROL) injection  60 mg Intravenous Q12H  . pantoprazole (PROTONIX) IV  40 mg Intravenous Q12H  . sucralfate  1 g Per Tube TID WC & HS    Continuous Infusions:   . dextrose 5 % 1,000 mL with potassium chloride 40 mEq infusion 75 mL/hr at 04/09/20 1119  . famotidine (PEPCID) IV 20 mg (04/09/20 1304)     LOS: 2 days     Vernell Leep, MD, [place], [facility]. [facility]    To contact the attending provider between 7A-7P or the covering provider during after hours 7P-7A, please log into the web site [email or web address] and access using universal [place] password for that web site. If you do not have the password, please call the hospital operator.  04/09/2020, 3:20 PM

## 2020-04-09 NOTE — Progress Notes (Signed)
Patient with itching and swollen tongue, MD notified and new orders given. Will continue to monitor.

## 2020-04-09 NOTE — Progress Notes (Signed)
Subjective: No acute events.  Objective: Vital signs in last 24 hours: Temp:  [98 F (36.7 C)-98.6 F (37 C)] 98 F (36.7 C) (09/26 0518) Pulse Rate:  [94-101] 96 (09/26 1311) Resp:  [14-16] 16 (09/26 1311) BP: (101-127)/(64-77) 127/77 (09/26 1311) SpO2:  [92 %-100 %] 100 % (09/26 1311) Last BM Date: 04/06/20  Intake/Output from previous day: 09/25 0701 - 09/26 0700 In: -  Out: 400 [Urine:400] Intake/Output this shift: Total I/O In: -  Out: 300 [Urine:300]  General appearance: no distress and sleeping soundly  Lab Results: Recent Labs    04/06/20 1859 04/07/20 0230 04/09/20 0637  WBC 10.0 12.7* 8.1  HGB 14.4 13.1 10.4*  HCT 45.3 42.6 34.4*  PLT 320 275 180   BMET Recent Labs    04/06/20 1859 04/06/20 1859 04/07/20 0002 04/07/20 0230 04/09/20 0637  NA 141  --   --  141 139  K 4.4  --   --  4.3 3.8  CL 94*  --   --  97* 98  CO2 27  --   --  27 32  GLUCOSE 85  --   --  105* 61*  BUN 30*  --   --  27* 17  CREATININE 0.79   < > 0.66 0.66 0.47  CALCIUM 9.3  --   --  8.7* 8.7*   < > = values in this interval not displayed.   LFT Recent Labs    04/09/20 0637  PROT 5.3*  ALBUMIN 3.1*  AST 23  ALT 12  ALKPHOS 43  BILITOT 0.7   PT/INR Recent Labs    04/06/20 2130  LABPROT 13.4  INR 1.1   Hepatitis Panel No results for input(s): HEPBSAG, HCVAB, HEPAIGM, HEPBIGM in the last 72 hours. C-Diff No results for input(s): CDIFFTOX in the last 72 hours. Fecal Lactopherrin No results for input(s): FECLLACTOFRN in the last 72 hours.  Studies/Results: DG Abd 1 View - KUB  Result Date: 04/07/2020 CLINICAL DATA:  NG tube placement EXAM: ABDOMEN - 1 VIEW COMPARISON:  04/03/2019 FINDINGS: The enteric tube courses through the gastric body and terminates near the pylorus/antrum. The bowel gas pattern is nonspecific. IMPRESSION: Enteric tube projects near the pylorus/antrum. Electronically Signed   By: Constance Holster M.D.   On: 04/07/2020 19:55   [place] EKG SITE  RITE  Result Date: 04/09/2020 If Site Rite image not attached, placement could not be confirmed due to current cardiac rhythm.   Medications:  Scheduled: . heparin  5,000 Units Subcutaneous Q8H  . lip balm      . methylPREDNISolone (SOLU-MEDROL) injection  60 mg Intravenous Q12H  . pantoprazole (PROTONIX) IV  40 mg Intravenous Q12H  . sucralfate  1 g Per Tube TID WC & HS   Continuous: . dextrose 5 % 1,000 mL with potassium chloride 40 mEq infusion 75 mL/hr at 04/09/20 1119  . famotidine (PEPCID) IV 20 mg (04/09/20 1304)    Assessment/Plan: 1) Esophageal stricture. 2) Malnutrition. 3) Abdominal pain. 4) ? SMA syndrome.   I spoke with Dr. Algis Liming and I agree with starting her on TPN.  Her nutrition needs to be improved and she has difficulty tolerating oral nutrition.  Currently there is no plan for any surgical intervention.  Plan: 1) Agree with TPN. 2) [facility] to resume care in the AM.  LOS: 2 days   Solita Macadam D 04/09/2020, 3:05 PM

## 2020-04-09 NOTE — Progress Notes (Signed)
Hypoglycemic Event  CBG: 56 at 1001 04/09/20  Treatment: D50 25 mL (12.5 gm)  Symptoms: None  Follow-up CBG: Time:1035 CBG Result:158  Possible Reasons for Event: Inadequate meal intake  Comments/MD notified: Dr Algis Liming, new orders given    Norva Pavlov

## 2020-04-10 ENCOUNTER — Inpatient Hospital Stay (HOSPITAL_COMMUNITY): Payer: No Typology Code available for payment source

## 2020-04-10 ENCOUNTER — Encounter (HOSPITAL_COMMUNITY): Payer: Self-pay | Admitting: Gastroenterology

## 2020-04-10 DIAGNOSIS — K315 Obstruction of duodenum: Secondary | ICD-10-CM

## 2020-04-10 LAB — GLUCOSE, CAPILLARY
Glucose-Capillary: 160 mg/dL — ABNORMAL HIGH (ref 70–99)
Glucose-Capillary: 166 mg/dL — ABNORMAL HIGH (ref 70–99)
Glucose-Capillary: 183 mg/dL — ABNORMAL HIGH (ref 70–99)
Glucose-Capillary: 199 mg/dL — ABNORMAL HIGH (ref 70–99)
Glucose-Capillary: 223 mg/dL — ABNORMAL HIGH (ref 70–99)

## 2020-04-10 LAB — CBC
HCT: 33.1 % — ABNORMAL LOW (ref 36.0–46.0)
Hemoglobin: 10 g/dL — ABNORMAL LOW (ref 12.0–15.0)
MCH: 26.5 pg (ref 26.0–34.0)
MCHC: 30.2 g/dL (ref 30.0–36.0)
MCV: 87.6 fL (ref 80.0–100.0)
Platelets: 170 10*3/uL (ref 150–400)
RBC: 3.78 MIL/uL — ABNORMAL LOW (ref 3.87–5.11)
RDW: 15.5 % (ref 11.5–15.5)
WBC: 5.9 10*3/uL (ref 4.0–10.5)
nRBC: 0 % (ref 0.0–0.2)

## 2020-04-10 LAB — COMPREHENSIVE METABOLIC PANEL
ALT: 12 U/L (ref 0–44)
AST: 20 U/L (ref 15–41)
Albumin: 3.1 g/dL — ABNORMAL LOW (ref 3.5–5.0)
Alkaline Phosphatase: 42 U/L (ref 38–126)
Anion gap: 8 (ref 5–15)
BUN: 7 mg/dL — ABNORMAL LOW (ref 8–23)
CO2: 40 mmol/L — ABNORMAL HIGH (ref 22–32)
Calcium: 8.8 mg/dL — ABNORMAL LOW (ref 8.9–10.3)
Chloride: 91 mmol/L — ABNORMAL LOW (ref 98–111)
Creatinine, Ser: 0.4 mg/dL — ABNORMAL LOW (ref 0.44–1.00)
GFR calc Af Amer: >60 mL/min (ref >60)
GFR calc non Af Amer: >60 mL/min (ref >60)
Glucose, Bld: 178 mg/dL — ABNORMAL HIGH (ref 70–99)
Potassium: 4.4 mmol/L (ref 3.5–5.1)
Sodium: 139 mmol/L (ref 135–145)
Total Bilirubin: 0.1 mg/dL — ABNORMAL LOW (ref 0.3–1.2)
Total Protein: 5.5 g/dL — ABNORMAL LOW (ref 6.5–8.1)

## 2020-04-10 LAB — PHOSPHORUS: Phosphorus: 1.6 mg/dL — ABNORMAL LOW (ref 2.5–4.6)

## 2020-04-10 LAB — MAGNESIUM: Magnesium: 1.8 mg/dL (ref 1.7–2.4)

## 2020-04-10 MED ORDER — METHYLPREDNISOLONE SODIUM SUCC 40 MG IJ SOLR
40.0000 mg | Freq: Two times a day (BID) | INTRAMUSCULAR | Status: DC
  Administered 2020-04-10 – 2020-04-11 (×2): 40 mg via INTRAVENOUS
  Filled 2020-04-10 (×2): qty 1

## 2020-04-10 MED ORDER — TRAVASOL 10 % IV SOLN
INTRAVENOUS | Status: AC
  Filled 2020-04-10: qty 302.4

## 2020-04-10 MED ORDER — INSULIN ASPART 100 UNIT/ML ~~LOC~~ SOLN
0.0000 [IU] | Freq: Four times a day (QID) | SUBCUTANEOUS | Status: DC
  Administered 2020-04-10: 3 [IU] via SUBCUTANEOUS
  Administered 2020-04-11: 1 [IU] via SUBCUTANEOUS
  Administered 2020-04-11 (×2): 2 [IU] via SUBCUTANEOUS

## 2020-04-10 MED ORDER — SODIUM PHOSPHATES 45 MMOLE/15ML IV SOLN
15.0000 mmol | Freq: Once | INTRAVENOUS | Status: AC
  Administered 2020-04-10: 15 mmol via INTRAVENOUS
  Filled 2020-04-10: qty 5

## 2020-04-10 MED ORDER — MAGNESIUM SULFATE IN D5W 1-5 GM/100ML-% IV SOLN
1.0000 g | Freq: Once | INTRAVENOUS | Status: AC
  Administered 2020-04-10: 1 g via INTRAVENOUS
  Filled 2020-04-10: qty 100

## 2020-04-10 MED ORDER — POTASSIUM CHLORIDE 2 MEQ/ML IV SOLN
INTRAVENOUS | Status: DC
  Filled 2020-04-10: qty 1000

## 2020-04-10 MED ORDER — IOHEXOL 300 MG/ML  SOLN
150.0000 mL | Freq: Once | INTRAMUSCULAR | Status: AC | PRN
  Administered 2020-04-10: 60 mL

## 2020-04-10 NOTE — Progress Notes (Signed)
3 Days Post-Op    CC: abdominal pain  Subjective: Having ongoing pain, a bit tearful about it.  Objective: Vital signs in last 24 hours: Temp:  [97.9 F (36.6 C)-98.4 F (36.9 C)] 98.4 F (36.9 C) (09/27 0509) Pulse Rate:  [75-96] 96 (09/27 0509) Resp:  [8-16] 8 (09/27 0509) BP: (101-148)/(64-91) 148/91 (09/27 0509) SpO2:  [96 %-100 %] 96 % (09/27 0509) Last BM Date: 04/06/20 1000 IV 500 urine Nothing else recorded in intake/output Afebrile vital signs are stable blood pressure and heart rate is slightly elevated this a.m. No labs this a.m. PICC line was placed this AM. Scheduled for upper GI today:  There is narrowing of the third portion of the duodenum at the level of the SMA, which may reflect a component of SMA compression. However, contrast advances from the 3rd to 4th portion of the duodenum without significant delay with the patient in the supine position.  The duodenal sweep is otherwise unremarkable. Intake/Output from previous day: 09/26 0701 - 09/27 0700 In: 1000 [I.V.:900; IV Piggyback:100] Out: 500 [Urine:500] Intake/Output this shift: No intake/output data recorded.  General appearance: alert, cooperative and no distress Resp: clear to auscultation bilaterally GI: soft, tender, tearful with ongoing discomfort.    Lab Results:  Recent Labs    04/09/20 0637  WBC 8.1  HGB 10.4*  HCT 34.4*  PLT 180    BMET Recent Labs    04/09/20 0637  NA 139  K 3.8  CL 98  CO2 32  GLUCOSE 61*  BUN 17  CREATININE 0.47  CALCIUM 8.7*   PT/INR No results for input(s): LABPROT, INR in the last 72 hours.  Recent Labs  Lab 04/06/20 1859 04/07/20 0230 04/09/20 0637  AST 29 26 23   ALT 13 12 12   ALKPHOS 73 64 43  BILITOT 1.1 0.9 0.7  PROT 7.4 6.7 5.3*  ALBUMIN 4.5 3.9 3.1*     Lipase     Component Value Date/Time   LIPASE 21 10/01/2019 1644     Medications: . Chlorhexidine Gluconate Cloth  6 each Topical Daily  . heparin  5,000 Units  Subcutaneous Q8H  . methylPREDNISolone (SOLU-MEDROL) injection  60 mg Intravenous Q12H  . pantoprazole (PROTONIX) IV  40 mg Intravenous Q12H  . sodium chloride flush  10-40 mL Intracatheter Q12H  . sucralfate  1 g Per Tube TID WC & HS   . dextrose 5 % 1,000 mL with potassium chloride 40 mEq infusion 75 mL/hr at 04/09/20 2330  . 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 On 02/11/2014 at the [facility] in [place], [place], by Dr. Juliane Poot, she had an exp lap, lysis of adhesions, Kocher maneuver, resection of peripancreatic gastrinoma.  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 They could not determine whether the neuroendocrine tumor was metastatic.No pancreatic tissue mentioned on path report. 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  ?              SMA compression. Contrast advances into the 3rd and 4th portions with  delay, in the supine position.   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  - Followed by [facility] with multiple esophageal dilatations for chronic esophageal stricture; Dilated Friday 9/24 - Dr. Bryan Lemma  -UGI pending 04/10/2020 Severe malnutrition  - BMI 13.67/prealbumin 6.3   FEN: IV fluids/n.p.o./TPN recommended by Dr. Dallas Breeding. Gerkin ID: None DVT: SCDs added/from our standpoint she can have chemical DVT prophylaxis Follow-up:  TBD   Plan:  Reviewed by Dr. Harlow Asa yesterday, Patient would benefit from starting nutritional support with TNA.  Doubt that a jejunostomy feeding tube or GJ tube could be successfully placed given the endoscopic findings on Friday.  PICC placed and to start TNA today.  Patient may be a candidate for surgical jejunostomy tube placement or possible gastrojejunostomy for management of SMA syndrome once malnutrition is addressed and corrected. Dr. Hassell Done will review today. UGI results aboved.    LOS: 3 days    Chanae Gemma 04/10/2020 Please see Amion

## 2020-04-10 NOTE — Progress Notes (Addendum)
PROGRESS NOTE   Claudia Wiley  XIH:[id number]    DOB: 03/06/1956    DOA: 04/06/2020  PCP: [facility]   I have briefly reviewed patients previous medical records in [facility].  Chief Complaint  Patient presents with  . Dehydration  . Diarrhea    Brief Narrative:  64 year old female with PMH of COPD, chronic hypoxic respiratory failure on 2 L/min home oxygen, HTN, reported prior history of gastrinoma s/p surgery in [place] in 2014-2015, lung cancer s/p left upper lobectomy 2014-2015, sepsis related acute necrotic esophagus February 2021, since then multiple esophageal strictures and has undergone several EGDs with dilatation as outpatient and most recent procedure was on 03/13/2020 when she had multiple benign strictures in the mid to distal esophagus that were dilated, presented to the ED with 3 to 4 days history of diffuse abdominal and lower chest discomfort, nausea, vomiting, inability to keep down oral intake and diarrhea.  General surgery and [facility] consulting.  S/p EGD, esophageal stricture dilatation, placement of NG tube during endoscopy on 9/24.  PICC line placed 9/26, TNA to start 9/27.  S/p UGI series 9/27.   Assessment & Plan:  Active Problems:   Essential hypertension   Nausea & vomiting   Abdominal pain   GERD (gastroesophageal reflux disease)   Pancreatic insufficiency   COPD (chronic obstructive pulmonary disease) (HCC)   Esophageal stricture   Protein-calorie malnutrition, severe   SMAS (superior mesenteric artery syndrome) (HCC)   Gastric outlet obstruction   Acute onset of abdominal pain, nausea, vomiting, transient diarrhea and poor oral intake: Reportedly started approximately 48 hours after initial Covid vaccine.  Initial CT abdomen showed distal esophageal wall thickening, dilated stomach and dilated proximal duodenum to the level of the SMA.  Lactate normal.  S/p EGD 9/24, esophageal stricture dilatation, placement of NG tube, showed  multiple duodenal ulcers which were biopsied and showed endoscopic appearance consistent with extraluminal compression by the SMA.  Treatment of SMA syndrome would be to improve calorie intake and restore fat pad in between SMA and duodenum.  General surgery input appreciated.  No surgical interventions at this time.  Continue supportive treatment with NPO, NG tube to low wall suction, IV PPI twice daily, sucralfate slurry, uptitrated IV morphine for pain relief.  PICC line placed 9/26.  TNA to start 9/27, pharmacy consulted.  UGI series 9/27 done to evaluate anatomy, shows narrowing of the third portion of the duodenum at the level of the SMA, which may reflect a component of SMA compression.  However contrast advances from 3rd-4th portion of the duodenum without significant delay with the patient in supine position.  Need to improve nutrition to restore fat pad in between SMA and duodenum.  Possible future jejunostomy tube placement of gastrojejunostomy once nutritional status has improved.  PMH: Both GI and general surgery indicate that even though there are reports of her having Whipple's procedure, imaging studies and multiple endoscopies are not consistent with post Whipple anatomy.  Per outside records obtained by general surgery, op report from 02/11/2014 confirms exploratory laparotomy, lysis of adhesions, Kocher maneuver and resection of peripancreatic gastrinoma.    Hypoglycemia: Noted on BMP this morning.  Confirmed by CBG.  Changed IV fluids to D5 infusion.  Monitor CBGs closely and treat per hypoglycemia protocol.  No further episodes noted.  Pruritus and tongue swelling: Reported by patient, family and patient's RN on 9/26.  Reportedly having diffuse body itching.?  Tongue swelling without thrush.  Unclear etiology.  Unsure  if this is an allergic reaction.  No new medications started today.  Started IV Solu-Medrol, Pepcid and as needed IV Benadryl.  Monitor closely and if persists, recurs or  worsens then will have to look through her med list for possible etiology.  None obvious at this time except the morphine.  Better today.  Tongue swelling seems to have completely resolved.  Will IV Solu-Medrol.  Anemia: Hemoglobin is dropped from 13.1-10.4 in the absence of overt bleeding.?  Dilutional.  Follow CBC.  Chronic dysphagia/recurrent esophageal strictures: Recurrent benign esophageal strictures due to previous acute necrotic esophagus and Candida esophagitis.  Requiring multiple EGDs and dilatations.  Had EGD and dilatation of strictures again on 9/24.  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  COPD/chronic respiratory failure with hypoxia on home oxygen 2 L/min: No clinical bronchospasm.  Essential hypertension: Controlled today.  Hypophosphatemia: Replace in TNA.  Body mass index is 14.12 kg/m.  Nutritional Status Nutrition Problem: Severe Malnutrition Etiology: chronic illness (COPD, recurrent esophageal strictures) Signs/Symptoms: percent weight loss, energy intake < or equal to 75% for > or equal to 1 month, severe fat depletion, severe muscle depletion Percent weight loss: 22 % (x 9 months) Interventions: Refer to RD note for recommendations  DVT prophylaxis: Place and maintain sequential compression device Start: 04/10/20 0841 heparin injection 5,000 Units Start: 04/07/20 0600     Code Status: Full Code  Family Communication: Discussed in detail with patient's daughter via phone on 9/26, updated care and answered all questions. Disposition:  Status is: Inpatient  Remains inpatient appropriate because:Inpatient level of care appropriate due to severity of illness   Dispo: The patient is from: Home              Anticipated d/c is to: Home              Anticipated d/c date is: > 3 days              Patient currently is not medically stable to d/c.        Consultants:   General surgery [facility]  Procedures:   EGD 9/24 NG tube placed during EGD 9/24 PICC line right upper extremity 9/26  Antimicrobials:    Anti-infectives (From admission, onward)   None        Subjective:  Seen this morning after procedure.  Currently without abdominal pain but states that abdominal pain is intermittent and significant.  Passed flatus but no BM.  States that her tongue swelling has resolved.  No abdominal pain, difficulty swallowing or breathing.  Objective:   Vitals:   04/09/20 1531 04/10/20 0509 04/10/20 0948 04/10/20 1315  BP: 101/64 (!) 148/91  102/66  Pulse: 75 96  78  Resp: 10 (!) 8  16  Temp: 97.9 F (36.6 C) 98.4 F (36.9 C)  98.7 F (37.1 C)  TempSrc: Oral Oral  Oral  SpO2: 99% 96%  97%  Weight:   31.7 kg   Height:        General exam: Middle-age female, small built, frail and chronically ill looking, lying uncomfortably in bed.  Appears comfortable in no distress at current time. Respiratory system: Clear to auscultation.  No increased work of breathing. Cardiovascular system: S1 & S2 heard, RRR. No JVD, murmurs, rubs, gallops or clicks. No pedal edema.  Telemetry personally reviewed: Sinus rhythm. Gastrointestinal system: Abdomen is nondistended, and nontender today. No organomegaly or masses felt. Normal bowel sounds heard.  NGT +. Central nervous system: Alert and oriented. No focal neurological deficits. Extremities: Symmetric 5 x 5 power.  Right upper arm PICC line. Skin: No rashes, lesions or ulcers Psychiatry: Judgement and insight appear normal. Mood & affect appropriate.  ENT: Tongue appears to be normal size without swelling or acute findings today.    Data Reviewed:   I have personally reviewed following labs and imaging studies   CBC: Recent Labs  Lab 04/06/20 1859 04/07/20 0230 04/09/20 0637  WBC 10.0 12.7* 8.1  HGB 14.4 13.1 10.4*  HCT 45.3 42.6 34.4*  MCV 82.5 83.7 88.4  PLT 320 275 409    Basic Metabolic Panel: Recent Labs  Lab  04/07/20 0230 04/09/20 0637 04/10/20 1005  NA 141 139 139  K 4.3 3.8 4.4  CL 97* 98 91*  CO2 27 32 40*  GLUCOSE 105* 61* 178*  BUN 27* 17 7*  CREATININE 0.66 0.47 0.40*  CALCIUM 8.7* 8.7* 8.8*  MG  --   --  1.8  PHOS  --   --  1.6*    Liver Function Tests: Recent Labs  Lab 04/07/20 0230 04/09/20 0637 04/10/20 1005  AST 26 23 20   ALT 12 12 12   ALKPHOS 64 43 42  BILITOT 0.9 0.7 0.1*  PROT 6.7 5.3* 5.5*  ALBUMIN 3.9 3.1* 3.1*    CBG: Recent Labs  Lab 04/09/20 1206 04/09/20 1746 04/10/20 1153  GLUCAP 127* 134* 166*    Microbiology Studies:   Recent Results (from the past 240 hour(s))  Blood culture (routine single)     Status: None (Preliminary result)   Collection Time: 04/06/20  9:30 PM   Specimen: BLOOD  Result Value Ref Range Status   Specimen Description   Final    BLOOD LEFT ANTECUBITAL Performed at [facility] [address], [place], [place] [address]    Special Requests   Final    BOTTLES DRAWN AEROBIC AND ANAEROBIC Blood Culture results may not be optimal due to an excessive volume of blood received in culture bottles Performed at [facility] [address], [place], [place] [address]    Culture   Final    NO GROWTH 3 DAYS Performed at [facility], [place] [address] [place], [place] [address]    Report Status PENDING  Incomplete  Urine culture     Status: None   Collection Time: 04/06/20  9:30 PM   Specimen: In/Out Cath Urine  Result Value Ref Range Status   Specimen Description   Final    IN/OUT CATH URINE Performed at [facility] [address] [place], [place] [address]    Special Requests   Final    NONE Performed at [facility], [place] [address], [place], [place] [address]    Culture   Final    NO GROWTH Performed at [facility], [place] [address], [place],  [address]    Report Status 04/08/2020 FINAL  Final  Respiratory Panel by RT  PCR (Flu A&B, Covid) - Nasopharyngeal Swab     Status: None   Collection Time: 04/06/20 10:26 PM   Specimen: Nasopharyngeal Swab  Result Value Ref Range Status   SARS Coronavirus 2 by RT PCR NEGATIVE NEGATIVE Final    Comment: (NOTE) SARS-CoV-2 target nucleic acids are NOT DETECTED.  The SARS-CoV-2 RNA is generally detectable in upper respiratoy specimens during the acute phase of infection. The lowest concentration of SARS-CoV-2 viral copies this  assay can detect is 131 copies/mL. A negative result does not preclude SARS-Cov-2 infection and should not be used as the sole basis for treatment or other patient management decisions. A negative result may occur with  improper specimen collection/handling, submission of specimen other than nasopharyngeal swab, presence of viral mutation(s) within the areas targeted by this assay, and inadequate number of viral copies (<131 copies/mL). A negative result must be combined with clinical observations, patient history, and epidemiological information. The expected result is Negative.  Fact Sheet for Patients:  PinkCheek.be  Fact Sheet for Healthcare Providers:  GravelBags.it  This test is no t yet approved or cleared by the [place] FDA and  has been authorized for detection and/or diagnosis of SARS-CoV-2 by FDA under an Emergency Use Authorization (EUA). This EUA will remain  in effect (meaning this test can be used) for the duration of the COVID-19 declaration under Section 564(b)(1) of the Act, 21 U.S.C. section 360bbb-3(b)(1), unless the authorization is terminated or revoked sooner.     Influenza A by PCR NEGATIVE NEGATIVE Final   Influenza B by PCR NEGATIVE NEGATIVE Final    Comment: (NOTE) The Xpert Xpress SARS-CoV-2/FLU/RSV assay is intended as an aid in  the diagnosis of influenza from Nasopharyngeal swab specimens and  should not be used as a sole basis for  treatment. Nasal washings and  aspirates are unacceptable for Xpert Xpress SARS-CoV-2/FLU/RSV  testing.  Fact Sheet for Patients: PinkCheek.be  Fact Sheet for Healthcare Providers: GravelBags.it  This test is not yet approved or cleared by the [place] FDA and  has been authorized for detection and/or diagnosis of SARS-CoV-2 by  FDA under an Emergency Use Authorization (EUA). This EUA will remain  in effect (meaning this test can be used) for the duration of the  Covid-19 declaration under Section 564(b)(1) of the Act, 21  U.S.C. section 360bbb-3(b)(1), unless the authorization is  terminated or revoked. Performed at [facility], [place] [address], [place], [place] [address]      Radiology Studies:  DG UGI W SINGLE CM (SOL OR THIN BA)  Result Date: 04/10/2020 CLINICAL DATA:  Duodenal stenosis. EXAM: UPPER GI SERIES WITH KUB TECHNIQUE: After obtaining a scout radiograph a routine upper GI series was performed using water-soluble contrast. FLUOROSCOPY TIME:  Fluoroscopy Time:  1 minutes, 48 seconds Radiation Exposure Index (if provided by the fluoroscopic device): 13.20 mGy Number of Acquired Spot Images: 3 COMPARISON:  Abdominal radiographs 04/07/2020. CT abdomen/pelvis 04/06/2020. FINDINGS: A scout radiograph of the abdomen demonstrates a post-pyloric enteric tube. No dilated loops of bowel within the visualized abdomen. Surgical clips within the right lower quadrant. No acute bony abnormality. A problem-oriented water-soluble single contrast upper GI series was performed by injecting the enteric tube. There is contrast opacification of the proximal duodenum. There is narrowing of the third portion of the duodenum at the level of the SMA. However, contrast advances to the fourth portion of the duodenum without significant delay. The duodenal sweep is otherwise unremarkable. IMPRESSION: Problem-oriented  water-soluble single contrast upper GI series as described. There is narrowing of the third portion of the duodenum at the level of the SMA, which may reflect a component of SMA compression. However, contrast advances from the 3rd to 4th portion of the duodenum without significant delay with the patient in the supine position. The duodenal sweep is otherwise unremarkable. Electronically Signed   By: Kellie Simmering DO   On: 04/10/2020 09:52   [place] EKG SITE RITE  Result Date: 04/09/2020 If Site Rite image not attached, placement could not be confirmed due to current cardiac rhythm.    Scheduled Meds:   . Chlorhexidine Gluconate Cloth  6 each Topical Daily  . heparin  5,000 Units Subcutaneous Q8H  . insulin aspart  0-9 Units Subcutaneous Q6H  . methylPREDNISolone (SOLU-MEDROL) injection  60 mg Intravenous Q12H  . pantoprazole (PROTONIX) IV  40 mg Intravenous Q12H  . sodium chloride flush  10-40 mL Intracatheter Q12H  . sucralfate  1 g Per Tube TID WC & HS    Continuous Infusions:   . dextrose 5 % 1,000 mL with potassium chloride 40 mEq infusion 75 mL/hr at 04/09/20 2330  . dextrose 5 % with kcl    . famotidine (PEPCID) IV 20 mg (04/10/20 1000)  . sodium phosphate  Dextrose 5% IVPB 15 mmol (04/10/20 1153)  . TPN ADULT (ION)       LOS: 3 days     Vernell Leep, MD, [place], [facility]. [facility]    To contact the attending provider between 7A-7P or the covering provider during after hours 7P-7A, please log into the web site [email or web address] and access using universal [place] password for that web site. If you do not have the password, please call the hospital operator.  04/10/2020, 2:52 PM

## 2020-04-10 NOTE — Progress Notes (Addendum)
[facility] Progress Note  CC:  N/V, dysphagia   Subjective: She is currently in radiology for an UGI series. She remains NPO. She complains of having esophageal pain. "everything hurts". She passed gas per the rectum this am. No BM.    Objective:  Vital signs in last 24 hours: Temp:  [97.9 F (36.6 C)-98.4 F (36.9 C)] 98.4 F (36.9 C) (09/27 0509) Pulse Rate:  [75-96] 96 (09/27 0509) Resp:  [8-16] 8 (09/27 0509) BP: (101-148)/(64-91) 148/91 (09/27 0509) SpO2:  [96 %-100 %] 96 % (09/27 0509) Last BM Date: 04/06/20 General:   Alert 64 year old female, soft spoken in NAD. Heart: RRR, no murmur.  Pulm:  Breath sounds clear throughout.  Abdomen: Flat, nontender. Hypoactive bowel sounds x 4 quads. Upper abdominal scar intact.  Extremities:  Without edema. Neurologic:  Alert and  oriented x4. Moves all extremities weakly.  Psych:  Alert and cooperative. Normal mood and affect.  Lab Results: Recent Labs    04/09/20 0637  WBC 8.1  HGB 10.4*  HCT 34.4*  PLT 180   BMET Recent Labs    04/09/20 0637  NA 139  K 3.8  CL 98  CO2 32  GLUCOSE 61*  BUN 17  CREATININE 0.47  CALCIUM 8.7*   LFT Recent Labs    04/09/20 0637  PROT 5.3*  ALBUMIN 3.1*  AST 23  ALT 12  ALKPHOS 43  BILITOT 0.7   PT/INR No results for input(s): LABPROT, INR in the last 72 hours. Hepatitis Panel No results for input(s): HEPBSAG, HCVAB, HEPAIGM, HEPBIGM in the last 72 hours.  [place] EKG SITE RITE  Result Date: 04/09/2020 If Site Rite image not attached, placement could not be confirmed due to current cardiac rhythm.   Assessment / Plan:  36. 64 year old female with a history of acute esophageal necrosis, esophageal strictures requiring multiple esophageal dilatations. Admitted to the hospital 04/07/2020 with N/V and dysphagia. CTAP showed thickening in the esophagus with proximal duodenal dilatation suspicious for SMA syndrome in setting of recent weight loss. S/P EGD  04/06/2020 identified multiple benign appearing intrinsic stenoses throughout the esophagus (the most pronounced stricture was traversed after dilation to 6mm), food residue was in the stomach, hiatal hernia, multiple deep duodenal ulcers and an extrinsic duodenal stenosis in the 3rd portion of the duodenum consistent with extraluminal compression by the SMA. A 4F NGT was placed.  UGI in process. General surgery following, possible future surgical jejunostomy tube placement or gatrojejunostomy for management of SMA once nutritional status corrected.  -Await upper GI series results  -Increase caloric intake to restore fat pad in between SMA and duodenum, TPN to be initiated today after PICC line placed.  -Continue Pantoprazole 40mg  IV bid, Famotidine 20mg  IV bid and Carafate 1GM qid  2. History of a gastrinoma s/p exploratory laparoscopy, lysis of adhesions and Kocher maneuver and resection of peripancreatic gastrinoma done in [place] 02/11/2014.   3. Malnourished secondary to # 1.   4. Anemia. Hg 10.4 on 9/26.  -Repeat CBC. Check iron panel, folate and B 12 level  5. History of Lung cancer s/p LUL lobectomy 09/2013  6. Glossitis on Solumedrol -Hopefully limit use of Solumedrol as patient is at risk for recurrent esophageal candidiasis  -Check B12 level  Further recommendations per Dr. Ardis Hughs     LOS: 3 days   Noralyn Pick  04/10/2020, 9:47 AM  ________________________________________________________________________  [facility] MD note:  I personally examined the patient, reviewed  the data and agree with the assessment and plan described above.  I am meeting her for the first time but it appears to me that she is withering for inability to supply her own nutrition. Her esophageal stricture process may have begun her situation and now there is legitimate concern (by CT, by UGI and by EGD) that she is having partial duodenal obstruction from SMA compression.  She needs a better way  to get her nutrition than orally. Our endoscopic attempts to periodically dilated her esophagus are obviously not working.   Appreciate general surgery considering surgical placement of J (or other type) feeding tube.  Owens Loffler, MD [facility] Pager [phone]

## 2020-04-10 NOTE — Progress Notes (Signed)
PHARMACY - TOTAL PARENTERAL NUTRITION CONSULT NOTE   Indication: esophageal stricture, malnutrition  Patient Measurements: Height: 4\' 11"  (149.9 cm) Weight: 30.7 kg (67 lb 10.9 oz) IBW/kg (Calculated) : 43.2 TPN AdjBW (KG): 30.7 Body mass index is 13.67 kg/m. 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  Glucose / Insulin: no hx DM; not currently on insulin - on solu medrol 60 mg IV q12h - cbg 178 from bmet Electrolytes: CL low 91, CO2 high 40; phos low 1.6, Mag 1.8; other lytes wnl Renal: scr 0.40 (crcl~36), BUN low 7 LFTs / TGs: AST/ALT wnl, Tbili low 0.1; TG pending Prealbumin / albumin: realbumin 6.3 (9/25); albumin 3.1 (9/26) Intake / Output; MIVF: I/O +500 - D5 with 40 meq/L at 75 ml/hr GI Imaging: - 9/23 abd CT: concern for "developing SMA syndrome Surgeries / Procedures:  9/24 EGD: multiple benign-appearing esophageal stenoses (dilated), multiple deep duodenal ulcers (biopsied), hiatal hernia, and extrinsic duodenal stenosis in the 3rd portion of the duodenum.  Significant events: - 9/24: NG tube placement - 9/26: tongue swelling --> started om solu medrol, pepcid, and benadryl prn  Central access: PICC placed on 9/26 TPN start date:   Nutritional Goals (per RD recommendation on 9/27): kCal: 1250-1450, Protein: 60-75, Fluid: 1.5 L/day  Goal TPN rate is 65 mL/hr (provides 65 g of protein and 1310 kcals per day)  Current Nutrition:  - NPO - start TPN on  9/27  Plan:   Now:  - magnesium sulfate 1gm IV x1 - sodium phosphate 15 mmol IV x1  - Start TPN at 30 mL/hr at 1800 - Electrolytes in TPN: 23mEq/L of Na, 86mEq/L of K, 59mEq/L of Ca, 64mEq/L of Mg, and 18mmol/L of Phos. Cl:Ac ratio max CL - Add standard MVI and trace elements to TPN - Initiate Sensitive q6h SSI and adjust as needed  - Reduce MIVF to 40 mL/hr at 1800 - pt's at high risk for refeeding syndrome. Will check CMET, phos, mag daily thru 10/30 - Monitor TPN labs on Mon/Thurs  Xyon Lukasik P 04/10/2020,9:22 AM

## 2020-04-10 NOTE — Progress Notes (Signed)
Nutrition Follow-up  DOCUMENTATION CODES:   Underweight, Severe malnutrition in context of chronic illness  INTERVENTION:   Monitor magnesium, potassium, and phosphorus daily for at least 3 days, MD to replete as needed, as pt is at risk for refeeding syndrome..  -TPN management per Pharmacy   NUTRITION DIAGNOSIS:   Severe Malnutrition related to chronic illness (COPD, recurrent esophageal strictures) as evidenced by percent weight loss, energy intake < or equal to 75% for > or equal to 1 month, severe fat depletion, severe muscle depletion.  Ongoing.  GOAL:   Patient will meet greater than or equal to 90% of their needs  Not meeting.  MONITOR:   Labs, Weight trends, I & O's, TPN  REASON FOR ASSESSMENT:   Consult New TPN/TNA  ASSESSMENT:   64 y.o. female, known to Dr. Fuller Plan, who was readmitted last evening after presenting to the emergency room with 3 to 4-day history of nausea and vomiting inability to keep down p.o.'s or meds.  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  She was told this was a Whipple procedure but no evidence of Whipple by more recent EGDs.  She also has history of lung cancer for which she underwent a left upper lobectomy in 2014 2015 also in [place].  9/24: s/p EGD, enteroscopy - esophageal strictures, multiple duodenal ulcerations, hiatal hernia, NGT placed set to LIS  PICC placed today and TPN to be initiated. Per surgery, may be a candidate for surgical placement of J-tube. Will monitor for plan of care.  Pt has been having tongue swelling and itching over the weekend.   Admission weight: 67 lbs. No new weights for admission yet.   Medications: Carafate, D5 infusion Labs reviewed:  CBGs:  127-134  Diet Order:   Diet Order            Diet NPO time specified  Diet effective now                 EDUCATION NEEDS:   Not appropriate for education at this time  Skin:  Skin Assessment: Reviewed RN Assessment  Last BM:  9/23  Height:   Ht Readings from Last 1 Encounters:  04/07/20 4\' 11"  (1.499 m)    Weight:   Wt Readings from Last 1 Encounters:  04/07/20 30.7 kg    BMI:  Body mass index is 13.67 kg/m.  Estimated Nutritional Needs:   Kcal:  1250-1450  Protein:  60-75g  Fluid:  1.5L/day   Clayton Bibles, MS, RD, LDN Inpatient Clinical Dietitian Contact information available via Amion

## 2020-04-11 DIAGNOSIS — K269 Duodenal ulcer, unspecified as acute or chronic, without hemorrhage or perforation: Secondary | ICD-10-CM

## 2020-04-11 DIAGNOSIS — K551 Chronic vascular disorders of intestine: Principal | ICD-10-CM

## 2020-04-11 DIAGNOSIS — K311 Adult hypertrophic pyloric stenosis: Secondary | ICD-10-CM

## 2020-04-11 DIAGNOSIS — K222 Esophageal obstruction: Secondary | ICD-10-CM

## 2020-04-11 DIAGNOSIS — E43 Unspecified severe protein-calorie malnutrition: Secondary | ICD-10-CM

## 2020-04-11 LAB — CBC
HCT: 37 % (ref 36.0–46.0)
Hemoglobin: 11.1 g/dL — ABNORMAL LOW (ref 12.0–15.0)
MCH: 26.2 pg (ref 26.0–34.0)
MCHC: 30 g/dL (ref 30.0–36.0)
MCV: 87.5 fL (ref 80.0–100.0)
Platelets: 202 10*3/uL (ref 150–400)
RBC: 4.23 MIL/uL (ref 3.87–5.11)
RDW: 15.7 % — ABNORMAL HIGH (ref 11.5–15.5)
WBC: 14.9 10*3/uL — ABNORMAL HIGH (ref 4.0–10.5)
nRBC: 0 % (ref 0.0–0.2)

## 2020-04-11 LAB — COMPREHENSIVE METABOLIC PANEL
ALT: 13 U/L (ref 0–44)
AST: 21 U/L (ref 15–41)
Albumin: 3.4 g/dL — ABNORMAL LOW (ref 3.5–5.0)
Alkaline Phosphatase: 41 U/L (ref 38–126)
Anion gap: 6 (ref 5–15)
BUN: 7 mg/dL — ABNORMAL LOW (ref 8–23)
CO2: 42 mmol/L — ABNORMAL HIGH (ref 22–32)
Calcium: 9 mg/dL (ref 8.9–10.3)
Chloride: 91 mmol/L — ABNORMAL LOW (ref 98–111)
Creatinine, Ser: 0.42 mg/dL — ABNORMAL LOW (ref 0.44–1.00)
GFR calc Af Amer: >60 mL/min (ref >60)
GFR calc non Af Amer: >60 mL/min (ref >60)
Glucose, Bld: 161 mg/dL — ABNORMAL HIGH (ref 70–99)
Potassium: 4.5 mmol/L (ref 3.5–5.1)
Sodium: 139 mmol/L (ref 135–145)
Total Bilirubin: 0.1 mg/dL — ABNORMAL LOW (ref 0.3–1.2)
Total Protein: 5.9 g/dL — ABNORMAL LOW (ref 6.5–8.1)

## 2020-04-11 LAB — DIFFERENTIAL
Abs Immature Granulocytes: 0.03 10*3/uL (ref 0.00–0.07)
Basophils Absolute: 0 10*3/uL (ref 0.0–0.1)
Basophils Relative: 0 %
Eosinophils Absolute: 0 10*3/uL (ref 0.0–0.5)
Eosinophils Relative: 0 %
Immature Granulocytes: 0 %
Lymphocytes Relative: 8 %
Lymphs Abs: 1.2 10*3/uL (ref 0.7–4.0)
Monocytes Absolute: 0.8 10*3/uL (ref 0.1–1.0)
Monocytes Relative: 5 %
Neutro Abs: 12.9 10*3/uL — ABNORMAL HIGH (ref 1.7–7.7)
Neutrophils Relative %: 87 %

## 2020-04-11 LAB — GLUCOSE, CAPILLARY
Glucose-Capillary: 178 mg/dL — ABNORMAL HIGH (ref 70–99)
Glucose-Capillary: 135 mg/dL — ABNORMAL HIGH (ref 70–99)
Glucose-Capillary: 170 mg/dL — ABNORMAL HIGH (ref 70–99)
Glucose-Capillary: 174 mg/dL — ABNORMAL HIGH (ref 70–99)

## 2020-04-11 LAB — IRON AND TIBC
Iron: 19 ug/dL — ABNORMAL LOW (ref 28–170)
Saturation Ratios: 5 % — ABNORMAL LOW (ref 10.4–31.8)
TIBC: 347 ug/dL (ref 250–450)
UIBC: 328 ug/dL

## 2020-04-11 LAB — FERRITIN: Ferritin: 17 ng/mL (ref 11–307)

## 2020-04-11 LAB — TRIGLYCERIDES: Triglycerides: 92 mg/dL (ref <150)

## 2020-04-11 LAB — PHOSPHORUS: Phosphorus: 2 mg/dL — ABNORMAL LOW (ref 2.5–4.6)

## 2020-04-11 LAB — MAGNESIUM: Magnesium: 2.1 mg/dL (ref 1.7–2.4)

## 2020-04-11 LAB — PREALBUMIN: Prealbumin: 10.2 mg/dL — ABNORMAL LOW (ref 18–38)

## 2020-04-11 LAB — VITAMIN B12: Vitamin B-12: 859 pg/mL (ref 180–914)

## 2020-04-11 MED ORDER — SODIUM PHOSPHATES 45 MMOLE/15ML IV SOLN
15.0000 mmol | Freq: Once | INTRAVENOUS | Status: AC
  Administered 2020-04-11: 15 mmol via INTRAVENOUS
  Filled 2020-04-11: qty 5

## 2020-04-11 MED ORDER — ALBUTEROL SULFATE HFA 108 (90 BASE) MCG/ACT IN AERS
2.0000 | INHALATION_SPRAY | Freq: Four times a day (QID) | RESPIRATORY_TRACT | Status: DC | PRN
  Filled 2020-04-11: qty 6.7

## 2020-04-11 MED ORDER — POTASSIUM CHLORIDE 2 MEQ/ML IV SOLN
INTRAVENOUS | Status: DC
  Filled 2020-04-11: qty 1000

## 2020-04-11 MED ORDER — INSULIN ASPART 100 UNIT/ML ~~LOC~~ SOLN
0.0000 [IU] | SUBCUTANEOUS | Status: DC
  Administered 2020-04-11 – 2020-04-12 (×3): 2 [IU] via SUBCUTANEOUS
  Administered 2020-04-12 – 2020-04-15 (×6): 1 [IU] via SUBCUTANEOUS
  Administered 2020-04-15: 2 [IU] via SUBCUTANEOUS
  Administered 2020-04-16: 1 [IU] via SUBCUTANEOUS
  Administered 2020-04-16: 2 [IU] via SUBCUTANEOUS
  Administered 2020-04-17: 1 [IU] via SUBCUTANEOUS

## 2020-04-11 MED ORDER — UMECLIDINIUM BROMIDE 62.5 MCG/INH IN AEPB
1.0000 | INHALATION_SPRAY | Freq: Every day | RESPIRATORY_TRACT | Status: DC
  Administered 2020-04-12 – 2020-04-19 (×7): 1 via RESPIRATORY_TRACT
  Filled 2020-04-11: qty 7

## 2020-04-11 MED ORDER — TIOTROPIUM BROMIDE MONOHYDRATE 2.5 MCG/ACT IN AERS: 2.0000 | INHALATION_SPRAY | Freq: Every day | RESPIRATORY_TRACT | Status: DC

## 2020-04-11 MED ORDER — MORPHINE SULFATE (PF) 2 MG/ML IV SOLN
2.0000 mg | INTRAVENOUS | Status: DC | PRN
  Administered 2020-04-11 – 2020-04-14 (×13): 2 mg via INTRAVENOUS
  Filled 2020-04-11 (×13): qty 1

## 2020-04-11 MED ORDER — TRAVASOL 10 % IV SOLN
INTRAVENOUS | Status: AC
  Filled 2020-04-11: qty 655.2

## 2020-04-11 MED ORDER — FLUTICASONE FUROATE-VILANTEROL 200-25 MCG/INH IN AEPB
1.0000 | INHALATION_SPRAY | Freq: Every day | RESPIRATORY_TRACT | Status: DC
  Administered 2020-04-12 – 2020-04-19 (×7): 1 via RESPIRATORY_TRACT
  Filled 2020-04-11: qty 28

## 2020-04-11 NOTE — Progress Notes (Signed)
4 Days Post-Op    AV:[id number] pain fatigue, vomiting and diarrhea  Subjective: NG out last PM she does not want it replaced unless she is asleep.  Done during her endoscopy procedure.  She has had no nausea or vomiting with the NG out.  She still continues to complain of pain.  Asking for pain medicine, but does not appear that distressed on exam.  Objective: Vital signs in last 24 hours: Temp:  [98.6 F (37 C)-98.7 F (37.1 C)] 98.6 F (37 C) (09/28 0601) Pulse Rate:  [78-91] 91 (09/28 0601) Resp:  [14-16] 14 (09/28 0601) BP: (102-119)/(66-81) 119/79 (09/28 0601) SpO2:  [97 %-100 %] 99 % (09/28 0601) Weight:  [31.7 kg] 31.7 kg (09/27 0948) Last BM Date: 04/06/20 281 IV recorded  Nothing PO Urine 600 NG 500  BM x 1 Afebrile, VSS WBC is up, More labs scheduled for tomrrow   Intake/Output from previous day: 09/27 0701 - 09/28 0700 In: 281.5 [IV Piggyback:281.5] Out: 1100 [Urine:600; Emesis/NG output:500] Intake/Output this shift: No intake/output data recorded.  General appearance: alert, cooperative and no distress GI: soft, non-tender; bowel sounds normal; no masses,  no organomegaly and complaining of pain   Lab Results:  Recent Labs    04/09/20 0637 04/10/20 1520  WBC 8.1 5.9  HGB 10.4* 10.0*  HCT 34.4* 33.1*  PLT 180 170    BMET Recent Labs    04/09/20 0637 04/10/20 1005  NA 139 139  K 3.8 4.4  CL 98 91*  CO2 32 40*  GLUCOSE 61* 178*  BUN 17 7*  CREATININE 0.47 0.40*  CALCIUM 8.7* 8.8*   PT/INR No results for input(s): LABPROT, INR in the last 72 hours.  Recent Labs  Lab 04/06/20 1859 04/07/20 0230 04/09/20 0637 04/10/20 1005  AST 29 26 23 20   ALT 13 12 12 12   ALKPHOS 73 64 43 42  BILITOT 1.1 0.9 0.7 0.1*  PROT 7.4 6.7 5.3* 5.5*  ALBUMIN 4.5 3.9 3.1* 3.1*     Lipase     Component Value Date/Time   LIPASE 21 10/01/2019 1644     Medications: . Chlorhexidine Gluconate Cloth  6 each Topical Daily  . heparin  5,000 Units  Subcutaneous Q8H  . insulin aspart  0-9 Units Subcutaneous Q6H  . methylPREDNISolone (SOLU-MEDROL) injection  40 mg Intravenous Q12H  . pantoprazole (PROTONIX) IV  40 mg Intravenous Q12H  . sodium chloride flush  10-40 mL Intracatheter Q12H  . 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 On 02/11/2014 at the [facility] in [place], [place], by Dr. 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 They could not determine whether the neuroendocrine tumor was metastatic.No pancreatic tissue mentioned on path report. 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  ?              SMA compression. Contrast advances into the 3rd and 4th portions with  delay, in the supine position.   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  - Followed by [facility] with multiple esophageal dilatations for chronic esophageal stricture; DilatedFriday 9/24 -Dr. Bryan Lemma  -UGI pending 04/10/2020 Severe malnutrition  - BMI 13.67/prealbumin 6.3   FEN: IV fluids/n.p.o./TPN recommended by Dr. Dallas Breeding. Gerkin ID: None DVT: SCDs added/from our standpoint she can have chemical DVT prophylaxis Follow-up: TBD  Plan:  Dr. Hassell Done plans to review with Gi, she had contrast go thru easily on UGI.  We are  also uncertain what the source of her esophageal necrosis was, and whether she is able to swallow safely or not.  She is doing well with her secretions after her NG came out.  We would most likely recommend trying some clears pending discussion with GI.    LOS: 4 days    Kathalina Ostermann 04/11/2020 Please see Amion

## 2020-04-11 NOTE — Hospital Course (Addendum)
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    General surgery and [facility] consulting.  S/p EGD, esophageal stricture dilatation, placement of NG tube during endoscopy on 9/24.  PICC line placed 9/26, TNA started 9/27. NGT came out overnight of 04/10/20 accidentally and patient was too nervous for replacement but denied any N/V nor abd pain.  After further discussion with GI and surgery, patient will undergo placement of percutaneous gastrostomy tube with eventual conversion to [place] tube.  She also underwent evaluation by SLP and had no obvious oral issues or overt aspiration.  She is of course at increased risk for aspiration due to her longstanding issues with esophageal dilations and strictures.  She was considered appropriate for initiating dysphagia level 2 diet with thin liquids if and when appropriate once procedures are done.  She underwent successful placement of G-tube on 04/13/2020 with interventional radiology. Enteral TF were started on 04/14/20.  She continued to have ongoing nausea and not quite tolerating tubes via the G-tube.  She was recommended for transitioning to [place] tube per surgery for easier nutrition/feeding. She underwent successful G to [place] conversion with IR on 10/5. Her daughter is still awaiting outpatient delivery/availability of her TF formula.

## 2020-04-11 NOTE — Progress Notes (Signed)
PROGRESS NOTE    Claudia Wiley   CHE:[id number]  DOB: 09/26/1955  DOA: 04/06/2020     4  PCP: [facility]  CC: abd pain  Hospital Course: Ms. 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    General surgery and [facility] consulting.  S/p EGD, esophageal stricture dilatation, placement of NG tube during endoscopy on 9/24.  PICC line placed 9/26, TNA started 9/27. NGT came out overnight of 04/10/20 accidentally and patient was too nervous for replacement but denied any N/V nor abd pain.     Interval History:  Last night her NG tube came out accidentally.  She is resting in bed comfortable this morning with no nausea or vomiting.  Still has some ongoing abdominal pain. Her voice is hoarse however later in the morning she was asking nursing if she could start having some sort of nutrition/food.  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  Initial CT abdomen showed distal esophageal wall thickening, dilated stomach and dilated proximal duodenum to the level of  the SMA.  - S/p EGD 9/24, esophageal stricture dilatation, placement of NG tube, showed multiple duodenal ulcers which were biopsied and showed endoscopic appearance consistent with extraluminal compression by the SMA.  Treatment of SMA syndrome would be to improve calorie intake and restore fat pad in between SMA and duodenum.   - General surgery input appreciated.  No surgical interventions at this time.  Continue supportive treatment (NGT displaced on 9/27 but no N/V currently) - Need to improve nutrition to restore fat pad in between SMA and duodenum.  Possible future jejunostomy tube placement of gastrojejunostomy once nutritional status has improved. - continue TPN for now  - SLP eval as patient wishing to try and eat some then will further discuss with surgery/GI regarding advancement/trial of diet  Hypoglycemia: - transient; now s/p D5 - d/c IVF and monitor CBG; change to q4h monitoring   Pruritus and tongue swelling: Reported by patient, family and patient's RN on 9/26.  Reportedly having diffuse body itching.?  Tongue swelling without thrush.  Unclear etiology.  Unsure if this is an allergic reaction.  No new medications started - overall has improved and no further swelling appreciated - d/c benadryl, pepcid, and solu-medrol and monitor   Anemia: Hemoglobin is dropped from 13.1-10.4 in the absence of overt bleeding.?  Dilutional.  Follow CBC.  Chronic dysphagia/recurrent esophageal strictures: Recurrent benign esophageal strictures due to previous acute necrotic esophagus and Candida esophagitis.  Requiring multiple EGDs and dilatations.  Had EGD and dilatation of strictures again on 9/24.  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  COPD/chronic respiratory  failure with hypoxia on home oxygen 2 L/min: No clinical bronchospasm.  Essential hypertension: Controlled today.  Hypophosphatemia: Replace in TNA.  Body  mass index is 14.12 kg/m.  Nutritional Status Nutrition Problem: Severe Malnutrition Etiology: chronic illness (COPD, recurrent esophageal strictures) Signs/Symptoms: percent weight loss, energy intake < or equal to 75% for > or equal to 1 month, severe fat depletion, severe muscle depletion Percent weight loss: 22 % (x 9 months) Interventions: Refer to RD note for recommendations  Antimicrobials: none  DVT prophylaxis: HSQ Code Status: Full Family Communication: none Disposition Plan: Status is: Inpatient  Remains inpatient appropriate because:IV treatments appropriate due to intensity of illness or inability to take PO and Inpatient level of care appropriate due to severity of illness   Dispo: The patient is from: Home              Anticipated d/c is to: Home              Anticipated d/c date is: > 3 days              Patient currently is not medically stable to d/c.   Objective: Blood pressure 136/83, pulse 85, temperature 98.5 F (36.9 C), temperature source Oral, resp. rate 14, height 4\' 11"  (1.499 m), weight 31.7 kg, SpO2 96 %.  Examination: General appearance: frail and thin woman with hoarse voice laying in bed in NAD Head: Normocephalic, without obvious abnormality, atraumatic Eyes: EOMI Lungs: clear to auscultation bilaterally Heart: regular rate and rhythm and S1, S2 normal Abdomen: TTP in epigastrium, no R/G; BS present Extremities: thin, no edema Skin: mobility and turgor normal Neurologic: weak but no focal deficits   Consultants:   GI  Surgery  Procedures:  EGD 9/24 NG tube placed during EGD 9/24 PICC line right upper extremity 9/26  Data Reviewed: I have personally reviewed following labs and imaging studies Results for orders placed or performed during the hospital encounter of 04/06/20 (from the past 24 hour(s))  Glucose, capillary     Status: Abnormal   Collection Time: 04/10/20  5:31 PM  Result Value Ref Range   Glucose-Capillary 223 (H)  70 - 99 mg/dL  Glucose, capillary     Status: Abnormal   Collection Time: 04/10/20 11:51 PM  Result Value Ref Range   Glucose-Capillary 160 (H) 70 - 99 mg/dL  Glucose, capillary     Status: Abnormal   Collection Time: 04/11/20  5:59 AM  Result Value Ref Range   Glucose-Capillary 178 (H) 70 - 99 mg/dL  Comprehensive metabolic panel     Status: Abnormal   Collection Time: 04/11/20  7:57 AM  Result Value Ref Range   Sodium 139 135 - 145 mmol/L   Potassium 4.5 3.5 - 5.1 mmol/L   Chloride 91 (L) 98 - 111 mmol/L   CO2 42 (H) 22 - 32 mmol/L   Glucose, Bld 161 (H) 70 - 99 mg/dL   BUN 7 (L) 8 - 23 mg/dL   Creatinine, Ser 0.42 (L) 0.44 - 1.00 mg/dL   Calcium 9.0 8.9 - 10.3 mg/dL   Total Protein 5.9 (L) 6.5 - 8.1 g/dL   Albumin 3.4 (L) 3.5 - 5.0 g/dL   AST 21 15 - 41 U/L   ALT 13 0 - 44 U/L   Alkaline Phosphatase 41 38 - 126 U/L   Total Bilirubin 0.1 (L) 0.3 - 1.2 mg/dL   GFR calc non Af Amer >60 >60 mL/min   GFR calc Af Amer >60 >60 mL/min  Anion gap 6 5 - 15  Prealbumin     Status: Abnormal   Collection Time: 04/11/20  7:57 AM  Result Value Ref Range   Prealbumin 10.2 (L) 18 - 38 mg/dL  Magnesium     Status: None   Collection Time: 04/11/20  7:57 AM  Result Value Ref Range   Magnesium 2.1 1.7 - 2.4 mg/dL  Phosphorus     Status: Abnormal   Collection Time: 04/11/20  7:57 AM  Result Value Ref Range   Phosphorus 2.0 (L) 2.5 - 4.6 mg/dL  Triglycerides     Status: None   Collection Time: 04/11/20  7:57 AM  Result Value Ref Range   Triglycerides 92 <150 mg/dL  CBC     Status: Abnormal   Collection Time: 04/11/20  7:57 AM  Result Value Ref Range   WBC 14.9 (H) 4.0 - 10.5 K/uL   RBC 4.23 3.87 - 5.11 MIL/uL   Hemoglobin 11.1 (L) 12.0 - 15.0 g/dL   HCT 37.0 36 - 46 %   MCV 87.5 80.0 - 100.0 fL   MCH 26.2 26.0 - 34.0 pg   MCHC 30.0 30.0 - 36.0 g/dL   RDW 15.7 (H) 11.5 - 15.5 %   Platelets 202 150 - 400 K/uL   nRBC 0.0 0.0 - 0.2 %  Differential     Status: Abnormal   Collection  Time: 04/11/20  7:57 AM  Result Value Ref Range   Neutrophils Relative % 87 %   Neutro Abs 12.9 (H) 1.7 - 7.7 K/uL   Lymphocytes Relative 8 %   Lymphs Abs 1.2 0.7 - 4.0 K/uL   Monocytes Relative 5 %   Monocytes Absolute 0.8 0 - 1 K/uL   Eosinophils Relative 0 %   Eosinophils Absolute 0.0 0 - 0 K/uL   Basophils Relative 0 %   Basophils Absolute 0.0 0 - 0 K/uL   Immature Granulocytes 0 %   Abs Immature Granulocytes 0.03 0.00 - 0.07 K/uL  Iron and TIBC     Status: Abnormal   Collection Time: 04/11/20  7:57 AM  Result Value Ref Range   Iron 19 (L) 28 - 170 ug/dL   TIBC 347 250 - 450 ug/dL   Saturation Ratios 5 (L) 10.4 - 31.8 %   UIBC 328 ug/dL  Ferritin     Status: None   Collection Time: 04/11/20  7:57 AM  Result Value Ref Range   Ferritin 17 11 - 307 ng/mL  Vitamin B12     Status: None   Collection Time: 04/11/20  7:57 AM  Result Value Ref Range   Vitamin B-12 859 180 - 914 pg/mL  Glucose, capillary     Status: Abnormal   Collection Time: 04/11/20 12:26 PM  Result Value Ref Range   Glucose-Capillary 135 (H) 70 - 99 mg/dL    Recent Results (from the past 240 hour(s))  Blood culture (routine single)     Status: None (Preliminary result)   Collection Time: 04/06/20  9:30 PM   Specimen: BLOOD  Result Value Ref Range Status   Specimen Description   Final    BLOOD LEFT ANTECUBITAL Performed at [facility], [place] [address], [place], [place] [address]    Special Requests   Final    BOTTLES DRAWN AEROBIC AND ANAEROBIC Blood Culture results may not be optimal due to an excessive volume of blood received in culture bottles Performed at [place] [address], [place], [place] [address]  Culture   Final    NO GROWTH 4 DAYS Performed at [facility], [place] [address], [place], [place] [address]    Report Status PENDING  Incomplete  Urine culture     Status: None   Collection Time: 04/06/20  9:30 PM   Specimen: In/Out Cath Urine   Result Value Ref Range Status   Specimen Description   Final    IN/OUT CATH URINE Performed at [facility] [address] [place], [place] [address]    Special Requests   Final    NONE Performed at [facility], [place] [address], [place], [place] [address]    Culture   Final    NO GROWTH Performed at [facility], [place] [address] [place], [place] [address]    Report Status 04/08/2020 FINAL  Final  Respiratory Panel by RT PCR (Flu A&B, Covid) - Nasopharyngeal Swab     Status: None   Collection Time: 04/06/20 10:26 PM   Specimen: Nasopharyngeal Swab  Result Value Ref Range Status   SARS Coronavirus 2 by RT PCR NEGATIVE NEGATIVE Final    Comment: (NOTE) SARS-CoV-2 target nucleic acids are NOT DETECTED.  The SARS-CoV-2 RNA is generally detectable in upper respiratoy specimens during the acute phase of infection. The lowest concentration of SARS-CoV-2 viral copies this assay can detect is 131 copies/mL. A negative result does not preclude SARS-Cov-2 infection and should not be used as the sole basis for treatment or other patient management decisions. A negative result may occur with  improper specimen collection/handling, submission of specimen other than nasopharyngeal swab, presence of viral mutation(s) within the areas targeted by this assay, and inadequate number of viral copies (<131 copies/mL). A negative result must be combined with clinical observations, patient history, and epidemiological information. The expected result is Negative.  Fact Sheet for Patients:  PinkCheek.be  Fact Sheet for Healthcare Providers:  GravelBags.it  This test is no t yet approved or cleared by the [place] FDA and  has been authorized for detection and/or diagnosis of SARS-CoV-2 by FDA under an Emergency Use Authorization (EUA). This EUA will remain  in effect (meaning this test can  be used) for the duration of the COVID-19 declaration under Section 564(b)(1) of the Act, 21 U.S.C. section 360bbb-3(b)(1), unless the authorization is terminated or revoked sooner.     Influenza A by PCR NEGATIVE NEGATIVE Final   Influenza B by PCR NEGATIVE NEGATIVE Final    Comment: (NOTE) The Xpert Xpress SARS-CoV-2/FLU/RSV assay is intended as an aid in  the diagnosis of influenza from Nasopharyngeal swab specimens and  should not be used as a sole basis for treatment. Nasal washings and  aspirates are unacceptable for Xpert Xpress SARS-CoV-2/FLU/RSV  testing.  Fact Sheet for Patients: PinkCheek.be  Fact Sheet for Healthcare Providers: GravelBags.it  This test is not yet approved or cleared by the [place] FDA and  has been authorized for detection and/or diagnosis of SARS-CoV-2 by  FDA under an Emergency Use Authorization (EUA). This EUA will remain  in effect (meaning this test can be used) for the duration of the  Covid-19 declaration under Section 564(b)(1) of the Act, 21  U.S.C. section 360bbb-3(b)(1), unless the authorization is  terminated or revoked. Performed at [facility], [place] [address], [place],  [address]      Radiology Studies: DG UGI W SINGLE CM (SOL OR THIN BA)  Result Date: 04/10/2020 CLINICAL DATA:  Duodenal stenosis. EXAM:  UPPER GI SERIES WITH KUB TECHNIQUE: After obtaining a scout radiograph a routine upper GI series was performed using water-soluble contrast. FLUOROSCOPY TIME:  Fluoroscopy Time:  1 minutes, 48 seconds Radiation Exposure Index (if provided by the fluoroscopic device): 13.20 mGy Number of Acquired Spot Images: 3 COMPARISON:  Abdominal radiographs 04/07/2020. CT abdomen/pelvis 04/06/2020. FINDINGS: A scout radiograph of the abdomen demonstrates a post-pyloric enteric tube. No dilated loops of bowel within the visualized abdomen. Surgical clips within the  right lower quadrant. No acute bony abnormality. A problem-oriented water-soluble single contrast upper GI series was performed by injecting the enteric tube. There is contrast opacification of the proximal duodenum. There is narrowing of the third portion of the duodenum at the level of the SMA. However, contrast advances to the fourth portion of the duodenum without significant delay. The duodenal sweep is otherwise unremarkable. IMPRESSION: Problem-oriented water-soluble single contrast upper GI series as described. There is narrowing of the third portion of the duodenum at the level of the SMA, which may reflect a component of SMA compression. However, contrast advances from the 3rd to 4th portion of the duodenum without significant delay with the patient in the supine position. The duodenal sweep is otherwise unremarkable. Electronically Signed   By: Kellie Simmering DO   On: 04/10/2020 09:52   DG UGI W SINGLE CM (SOL OR THIN BA)  Final Result    [place] EKG SITE RITE  Final Result    DG Abd 1 View - KUB  Final Result    CT ABDOMEN PELVIS W CONTRAST  Final Result    DG Chest Port 1 View  Final Result      Scheduled Meds: . Chlorhexidine Gluconate Cloth  6 each Topical Daily  . heparin  5,000 Units Subcutaneous Q8H  . insulin aspart  0-9 Units Subcutaneous Q6H  . methylPREDNISolone (SOLU-MEDROL) injection  40 mg Intravenous Q12H  . pantoprazole (PROTONIX) IV  40 mg Intravenous Q12H  . sodium chloride flush  10-40 mL Intracatheter Q12H  . sucralfate  1 g Per Tube TID WC & HS   PRN Meds: diphenhydrAMINE, morphine injection, ondansetron **OR** ondansetron (ZOFRAN) IV, phenol, sodium chloride flush Continuous Infusions: . dextrose 5 % with kcl 40 mL/hr at 04/10/20 1838  . dextrose 5 % with kcl    . famotidine (PEPCID) IV 20 mg (04/11/20 1123)  . sodium phosphate  Dextrose 5% IVPB 15 mmol (04/11/20 1250)  . TPN ADULT (ION) 30 mL/hr at 04/10/20 1837  . TPN ADULT (ION)        LOS: 4 days   Time spent: Greater than 50% of the 35 minute visit was spent in counseling/coordination of care for the patient as laid out in the A&P.   Dwyane Dee, MD [facility] 04/11/2020, 4:10 PM  Contact via secure chat.  To contact the attending provider between 7A-7P or the covering provider during after hours 7P-7A, please log into the web site [email or web address] and access using universal [place] password for that web site. If you do not have the password, please call the hospital operator.

## 2020-04-11 NOTE — Progress Notes (Addendum)
PHARMACY - TOTAL PARENTERAL NUTRITION CONSULT NOTE   Indication: esophageal stricture, malnutrition  Patient Measurements: Height: 4\' 11"  (149.9 cm) Weight: 31.7 kg (69 lb 14.2 oz) IBW/kg (Calculated) : 43.2 TPN AdjBW (KG): 30.7 Body mass index is 14.12 kg/m. 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  Glucose / Insulin: no hx DM; sensitive SSI q6h (used 4 units from 6p on 9/27 to 6a on 9/28) - on solu medrol 60 mg IV q12h - CBGs: 162-178 since TPN started at 6p on 9/27 Electrolytes: CL low 91, CO2 high 42; phos is low but up to 2 (s/p 15 mmol Na phos on 9/27); other lytes wnl Renal: scr 0.42 (crcl~36), BUN low 7 LFTs / TGs: AST/ALT wnl, Tbili low 0.1; TG pending Prealbumin / albumin: realbumin 6.3 (9/25), 10.2 (9/28); albumin 3.4 (9/28) Intake / Output; MIVF: I/O - 235 - D5 with 40 meq/L at 40 ml/hr GI Imaging: - 9/23 abd CT: concern for "developing SMA syndrome Surgeries / Procedures:  9/24 EGD: multiple benign-appearing esophageal stenoses (dilated), multiple deep duodenal ulcers (biopsied), hiatal hernia, and extrinsic duodenal stenosis in the 3rd portion of the duodenum.  Significant events: - 9/24: NG tube placement - 9/26: tongue swelling --> started om solu medrol, pepcid, and benadryl prn - 9/27: NG tube came out  Central access: PICC placed on 9/26 TPN start date:   Nutritional Goals (per RD recommendation on 9/27): kCal: 1250-1450, Protein: 60-75,  Fluid: 1.5 L/day  Goal TPN rate is 65 mL/hr (provides 65 g of protein and 1310 kcals per day)  Current Nutrition:  - NPO - start TPN on 9/27  Plan:   Now:  - repeat sodium phosphate 15 mmol IV x1  - Increase TPN to goal rate of 65 mL/hr at 1800 - Electrolytes in TPN: 57mEq/L of Na, 47mEq/L of K, 86mEq/L of Ca, 42mEq/L of Mg, and 78mmol/L of Phos. Cl:Ac ratio max CL - Add standard MVI and trace elements to TPN - Initiate Sensitive q6h SSI and adjust as needed  - Reduce MIVF to KVO mL/hr at 1800 - pt's at high risk for refeeding syndrome. Will check CMET, phos, mag daily thru 10/30 - Monitor TPN labs on Mon/Thurs  Laelani Vasko P 04/11/2020,7:01 AM

## 2020-04-11 NOTE — Evaluation (Signed)
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  General surgery and [facility] consulting.  S/p EGD, esophageal stricture dilatation, placement of NG tube during endoscopy on 9/24.  PICC line placed 9/26, TNA to start 9/27.  S/p UGI series 9/27.  Clinical Impression  Pt admitted with above diagnosis.  PT well known to me from previous admissions. She presents today with generalized weakness and fatigue. She is agreeable to bed level activity only, d/t incr WOB after bed to [facility] transfer prior to PT arrival.  Supportive dtr present.  Pt is agreeable to PT in acute setting, doubt she will need f/u as long as she progresses steadily, has supportive family. Pt and dtr report she was quite independent after having 4-6 wks of HHPT after her hospital stay in April.  Will continue to follow and progress as tolerated. Pt currently with functional limitations due to the deficits listed below (see PT Problem List). Pt will benefit from skilled PT to increase their independence and safety with mobility to allow discharge to the venue listed below.     Follow Up Recommendations No PT follow up    Equipment Recommendations  None recommended  by PT    Recommendations for Other Services       Precautions / Restrictions Precautions Precautions: Fall Restrictions Weight Bearing Restrictions: No      Mobility  Bed Mobility               General bed mobility comments: pt just back to bed from [facility], too fatigued to get back up at this time  Transfers                    Ambulation/Gait                Stairs            Wheelchair Mobility    Modified Rankin (Stroke Patients Only)       Balance                                             Pertinent Vitals/Pain Pain Assessment: Faces Faces Pain Scale: Hurts a little bit Pain Location: "everywhere" Pain Descriptors / Indicators: Grimacing Pain Intervention(s): Monitored during session;Limited activity within patient's tolerance    Home Living Family/patient expects to be discharged to:: Private residence Living Arrangements: Children Available Help at Discharge: Family Type of Home: Apartment Home Access: Stairs to enter Entrance Stairs-Rails: Right Entrance Stairs-Number of Steps: 2 Home Layout: One level Home Equipment: Wheelchair - [employer] - 4 wheels Additional Comments: Pt likes to bathe in tub, requires assistance to get in/out of tub. Dtr works    Prior  Function Level of Independence: Needs assistance   Gait / Transfers Assistance Needed: uses rollator PRN  ADL's / Homemaking Assistance Needed: family assists with household tasks and A her with some of her bathing and dressing  Comments: daughter and granddaughter work in the evening, report family able to provide care as needed     Hand Dominance        Extremity/Trunk Assessment   Upper Extremity Assessment Upper Extremity Assessment: Generalized weakness (mild UE edema)    Lower Extremity Assessment Lower Extremity Assessment: Generalized weakness (AAROM WFL bil LEs)       Communication   Communication: No difficulties  Cognition  Arousal/Alertness: Awake/alert Behavior During Therapy: WFL for tasks assessed/performed Overall Cognitive Status: Within Functional Limits for tasks assessed                                        General Comments      Exercises     Assessment/Plan    PT Assessment Patient needs continued PT services  PT Problem List Decreased strength;Decreased mobility;Decreased activity tolerance;Pain       PT Treatment Interventions DME instruction;Therapeutic activities;Gait training;Functional mobility training;Therapeutic exercise;Patient/family education    PT Goals (Current goals can be found in the Care Plan section)  Acute Rehab PT Goals Patient Stated Goal: to go back home PT Goal Formulation: With patient Time For Goal Achievement: 04/24/20 Potential to Achieve Goals: Good    Frequency Min 3X/week   Barriers to discharge        Co-evaluation               AM-PAC PT "6 Clicks" Mobility  Outcome Measure Help needed turning from your back to your side while in a flat bed without using bedrails?: A Little Help needed moving from lying on your back to sitting on the side of a flat bed without using bedrails?: A Little Help needed moving to and from a bed to a chair (including a wheelchair)?: A Little Help needed standing up from a chair using your arms (e.g., wheelchair or bedside chair)?: A Little Help needed to walk in hospital room?: A Little Help needed climbing 3-5 steps with a railing? : A Lot 6 Click Score: 17    End of Session   Activity Tolerance: Patient limited by fatigue Patient left: with call bell/phone within reach;in bed;with family/visitor present;with bed alarm set   PT Visit Diagnosis: Other abnormalities of gait and mobility (R26.89);Muscle weakness (generalized) (M62.81)    Time: 3546-5681 PT Time Calculation (min) (ACUTE ONLY): 16 min   Charges:   PT Evaluation $PT Eval Low Complexity: [place],  PT  Acute Rehab Dept ([facility]) [phone] Pager [phone]  04/11/2020   [facility] 04/11/2020, 2:56 PM

## 2020-04-11 NOTE — Progress Notes (Signed)
While NT was assisting Claudia Wiley to bedside commode NG tube fell out of nare. RN explained to Claudia Wiley that it NG tube has to be replaced. Claudia Wiley states that she was put to sleep for NG placement and wants to be put to sleep again. On call made aware. Will continue to monitor.

## 2020-04-11 NOTE — Progress Notes (Addendum)
[facility] Progress Note  CC:  N/V, dysphagia   Subjective: NGT was inadvertently pulled out last night. She does not want to have the NGT replaced. She stated the NGT was placed when she was asleep (during her EGD procedure) and she is afraid to have the NGT reinserted. No N/V. She continues to have generalized abdominal pain, no worse today.    Objective:  Vital signs in last 24 hours: Temp:  [98.6 F (37 C)-98.7 F (37.1 C)] 98.6 F (37 C) (09/28 0601) Pulse Rate:  [78-91] 91 (09/28 0601) Resp:  [14-16] 14 (09/28 0601) BP: (102-119)/(66-81) 119/79 (09/28 0601) SpO2:  [97 %-100 %] 99 % (09/28 0601) Weight:  [31.7 kg] 31.7 kg (09/27 0948) Last BM Date: 04/06/20   General:   Alert in NAD cachectic female in NAD.  Dysphonia Heart: RRR, no murmur.  Pulm: Breath sounds clear throughout.  Abdomen: Soft, nondistended. Moderate generalized tenderness throughout without rebound or guarding. Hypoactive bowel sounds x 4 quads. Upper abdominal and midline lower abdominal scar intact. Right flank drsg intact( ? skin tear, patient reported area bled last night, day shift RN at bedside to further assess and discuss management with the hospitalist.  Extremities:  Without edema. Neurologic:  Alert and  oriented x4. Generalized weakness.  Psych:  Alert and cooperative. Normal mood and affect.  Intake/Output from previous day: 09/27 0701 - 09/28 0700 In: 281.5 [IV Piggyback:281.5] Out: 1100 [Urine:600; Emesis/NG output:500] Intake/Output this shift: No intake/output data recorded.  Lab Results: Recent Labs    04/09/20 0637 04/10/20 1520  WBC 8.1 5.9  HGB 10.4* 10.0*  HCT 34.4* 33.1*  PLT 180 170   BMET Recent Labs    04/09/20 0637 04/10/20 1005  NA 139 139  K 3.8 4.4  CL 98 91*  CO2 32 40*  GLUCOSE 61* 178*  BUN 17 7*  CREATININE 0.47 0.40*  CALCIUM 8.7* 8.8*   LFT Recent Labs    04/10/20 1005  PROT 5.5*  ALBUMIN 3.1*  AST 20  ALT 12  ALKPHOS 42   BILITOT 0.1*   PT/INR No results for input(s): LABPROT, INR in the last 72 hours. Hepatitis Panel No results for input(s): HEPBSAG, HCVAB, HEPAIGM, HEPBIGM in the last 72 hours.  DG UGI W SINGLE CM (SOL OR THIN BA)  Result Date: 04/10/2020 CLINICAL DATA:  Duodenal stenosis. EXAM: UPPER GI SERIES WITH KUB TECHNIQUE: After obtaining a scout radiograph a routine upper GI series was performed using water-soluble contrast. FLUOROSCOPY TIME:  Fluoroscopy Time:  1 minutes, 48 seconds Radiation Exposure Index (if provided by the fluoroscopic device): 13.20 mGy Number of Acquired Spot Images: 3 COMPARISON:  Abdominal radiographs 04/07/2020. CT abdomen/pelvis 04/06/2020. FINDINGS: A scout radiograph of the abdomen demonstrates a post-pyloric enteric tube. No dilated loops of bowel within the visualized abdomen. Surgical clips within the right lower quadrant. No acute bony abnormality. A problem-oriented water-soluble single contrast upper GI series was performed by injecting the enteric tube. There is contrast opacification of the proximal duodenum. There is narrowing of the third portion of the duodenum at the level of the SMA. However, contrast advances to the fourth portion of the duodenum without significant delay. The duodenal sweep is otherwise unremarkable. IMPRESSION: Problem-oriented water-soluble single contrast upper GI series as described. There is narrowing of the third portion of the duodenum at the level of the SMA, which may reflect a component of SMA compression. However, contrast advances from the 3rd to 4th portion of the duodenum  without significant delay with the patient in the supine position. The duodenal sweep is otherwise unremarkable. Electronically Signed   By: Kellie Simmering DO   On: 04/10/2020 09:52   [place] EKG SITE RITE  Result Date: 04/09/2020 If Site Rite image not attached, placement could not be confirmed due to current cardiac rhythm.   Assessment / Plan:  21. 64 year old  female with a history of acute esophageal necrosis, esophageal strictures requiring multiple esophageal dilatations. Admitted to the hospital 04/07/2020 with N/V and dysphagia. CTAP showed thickening in the esophagus with proximal duodenal dilatation suspicious for SMA syndrome in setting of recent weight loss. S/P EGD 04/06/2020 identified multiple benign appearing intrinsic stenoses throughout the esophagus, food residue in the stomach, hiatal hernia, multiple deep duodenal ulcers and an extrinsic duodenal stenosis in the 3rd portion of the duodenum consistent with extraluminal compression by the SMA.  NGT placed during EGD. UGI 9/27  showed narrowing of the 3rd portion of the duodenum which suggests a component of SMA compression, however, contrast advanced to the duodenum without delay. No further endoscopic procedures warranted. Recommend feeding tube placement distal to the site of SMA compression by general surgery. NGT fell out last night. Patient refuses to have NGT replaced at this time.  -Recommend NGT placement If patient develops N/V or abdominal distension  -Continue TPN, labs per pharmacy  -Increase caloric intake to restore fat pad in between SMA and duodenum -Continue Pantoprazole 40mg  IV bid, Famotidine 20mg  IV bid and Carafate 1GM qid -Await further input from general surgery regarding placement of feeding tube distal to the site of SMA compression.   2. History of a gastrinoma s/p exploratory laparoscopy, lysis of adhesions and Kocher maneuver and resection of peripancreatic gastrinoma done in [place] 02/11/2014.   3. Severely Malnourished secondary to # 1.  -See Plan in # 1  4. Normocytic Anemia. Hg 10.4 -> 10.4.  No overt GI bleeding.  -Add iron panel and B12 to am lab draw    5. History of Lung cancer s/p LUL lobectomy 09/2013     Active Problems:   Essential hypertension   Nausea & vomiting   Abdominal pain   GERD (gastroesophageal reflux disease)   Pancreatic  insufficiency   COPD (chronic obstructive pulmonary disease) (HCC)   Esophageal stricture   Protein-calorie malnutrition, severe   SMAS (superior mesenteric artery syndrome) (HCC)   Gastric outlet obstruction   Duodenal stenosis     LOS: 4 days   Noralyn Pick  04/11/2020, 08:51 AM  I have discussed the case with the PA, and that is the plan I formulated. I personally interviewed and examined the patient.  I performed a chart review and also discussed the case with my partner, Dr. Ardis Hughs.  Complex case of chronic severe esophageal stricture with dysphagia and worsening malnutrition complicated by diffuse duodenal ulcer disease and SMA syndrome. Surgical consultation and follow-up is greatly appreciated.  Despite the UGIS showing passage of liquid barium through the duodenal stenosis, we believe that this patient's significant stricturing esophageal disease with odynophagia as well as chronic abdominal pain and duodenal ulcer disease will continue to be a major obstacle to her taking sufficient oral nutrition (even in liquid form) to recover from her severe malnutrition and thus reverse the SMA syndrome.  The SMA syndrome is also effectively causing gastric outlet obstruction with proximal duodenal dilation on imaging and most likely impaired gastroduodenal motility, further perpetuating this cycle of illness and malnutrition.   Of course, we  appreciate the risks associated with surgical intervention in a severely malnourished patient.  Nevertheless, after a period of TPN, we still feel this patient would benefit from at least a surgically placed jejunostomy tube if not a combination J-tube and gastrojejunostomy. Dr. Ardis Hughs or I will have further dialogue about this with Dr. Hassell Done of surgery.  Meanwhile, medical therapy as above.  I also recommended viscous lidocaine the patient, but says she has tried it several times before and is unable to tolerate it.   Total time 25 minutes   Nelida Meuse III Office: [phone]

## 2020-04-12 LAB — CULTURE, BLOOD (SINGLE): Culture: NO GROWTH

## 2020-04-12 LAB — MAGNESIUM: Magnesium: 2 mg/dL (ref 1.7–2.4)

## 2020-04-12 LAB — COMPREHENSIVE METABOLIC PANEL
ALT: 33 U/L (ref 0–44)
AST: 51 U/L — ABNORMAL HIGH (ref 15–41)
Albumin: 3 g/dL — ABNORMAL LOW (ref 3.5–5.0)
Alkaline Phosphatase: 40 U/L (ref 38–126)
Anion gap: 6 (ref 5–15)
BUN: 11 mg/dL (ref 8–23)
CO2: 44 mmol/L — ABNORMAL HIGH (ref 22–32)
Calcium: 8.9 mg/dL (ref 8.9–10.3)
Chloride: 94 mmol/L — ABNORMAL LOW (ref 98–111)
Creatinine, Ser: 0.34 mg/dL — ABNORMAL LOW (ref 0.44–1.00)
GFR calc Af Amer: >60 mL/min (ref >60)
GFR calc non Af Amer: >60 mL/min (ref >60)
Glucose, Bld: 112 mg/dL — ABNORMAL HIGH (ref 70–99)
Potassium: 3.6 mmol/L (ref 3.5–5.1)
Sodium: 144 mmol/L (ref 135–145)
Total Bilirubin: 0.4 mg/dL (ref 0.3–1.2)
Total Protein: 5.3 g/dL — ABNORMAL LOW (ref 6.5–8.1)

## 2020-04-12 LAB — GLUCOSE, CAPILLARY
Glucose-Capillary: 165 mg/dL — ABNORMAL HIGH (ref 70–99)
Glucose-Capillary: 128 mg/dL — ABNORMAL HIGH (ref 70–99)
Glucose-Capillary: 112 mg/dL — ABNORMAL HIGH (ref 70–99)
Glucose-Capillary: 128 mg/dL — ABNORMAL HIGH (ref 70–99)
Glucose-Capillary: 133 mg/dL — ABNORMAL HIGH (ref 70–99)
Glucose-Capillary: 126 mg/dL — ABNORMAL HIGH (ref 70–99)

## 2020-04-12 LAB — PHOSPHORUS: Phosphorus: 2.1 mg/dL — ABNORMAL LOW (ref 2.5–4.6)

## 2020-04-12 LAB — SURGICAL PATHOLOGY

## 2020-04-12 MED ORDER — TRAVASOL 10 % IV SOLN
INTRAVENOUS | Status: DC
  Filled 2020-04-12: qty 655.2

## 2020-04-12 MED ORDER — POTASSIUM PHOSPHATES 15 MMOLE/5ML IV SOLN
10.0000 mmol | Freq: Once | INTRAVENOUS | Status: AC
  Administered 2020-04-12 (×2): 10 mmol via INTRAVENOUS
  Filled 2020-04-12: qty 3.33

## 2020-04-12 MED ORDER — CEFAZOLIN SODIUM-DEXTROSE 2-4 GM/100ML-% IV SOLN
2.0000 g | INTRAVENOUS | Status: AC
  Administered 2020-04-13: 2 g via INTRAVENOUS

## 2020-04-12 MED ORDER — POTASSIUM CHLORIDE 10 MEQ/50ML IV SOLN
10.0000 meq | INTRAVENOUS | Status: AC
  Administered 2020-04-12 (×3): 10 meq via INTRAVENOUS
  Filled 2020-04-12 (×3): qty 50

## 2020-04-12 MED ORDER — DIPHENHYDRAMINE HCL 50 MG/ML IJ SOLN
12.5000 mg | Freq: Every evening | INTRAMUSCULAR | Status: DC | PRN
  Administered 2020-04-12 – 2020-04-17 (×6): 12.5 mg via INTRAVENOUS
  Filled 2020-04-12 (×6): qty 1

## 2020-04-12 MED ORDER — HEPARIN SODIUM (PORCINE) 5000 UNIT/ML IJ SOLN
5000.0000 [IU] | Freq: Three times a day (TID) | INTRAMUSCULAR | Status: DC
  Administered 2020-04-13 – 2020-04-18 (×14): 5000 [IU] via SUBCUTANEOUS
  Filled 2020-04-12 (×16): qty 1

## 2020-04-12 NOTE — Progress Notes (Signed)
PHARMACY - TOTAL PARENTERAL NUTRITION CONSULT NOTE   Indication: esophageal stricture, malnutrition  Patient Measurements: Height: 4\' 11"  (149.9 cm) Weight: 31.7 kg (69 lb 14.2 oz) IBW/kg (Calculated) : 43.2 TPN AdjBW (KG): 30.7 Body mass index is 14.12 kg/m. 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  Abdominal CT on 9/23 showed findings with concern for "developing SMA syndrome."  EGD on 9/24 showed "multiple benign-appearing esophageal stenoses (dilated), multiple deep duodenal ulcers (biopsied),hiatal hernia and extrinsic duodenal stenosis in the 3rd portion of the duodenum."  Pharmacy consulted to initiate TPN on 9/27.  Glucose / Insulin: no hx DM; BG goal <150 -CBGs range: 112-170, 8 units SSI/24 hrs on sSSI q4h per MD - Solu-Medrol discontinued on 9/28 Electrolytes:   Na (144) on upper end of normal, K (3.6) on lower end of normal  CO2 (44) elevated; Cl (94), Phos (2.1) low  Mg, Corr Ca (9.7) WNL Renal: SCr stable/low (crcl~36), BUN WNL LFTs / TGs: AST/ALT wnl, Tbili low 0.4; TG WNL (92) Prealbumin / albumin: prealbumin 6.3 (9/25) > 10.2 (9/28); albumin 3.4 (9/28) >3 (9/29) Intake / Output; MIVF: Unmeasured UOP x7.  - No MIVF GI Imaging: - 9/23 abd CT: concern for "developing SMA syndrome Surgeries / Procedures:  9/24 EGD: multiple benign-appearing esophageal stenoses (dilated), multiple deep duodenal ulcers (biopsied), hiatal hernia, and extrinsic duodenal stenosis in the 3rd portion of the duodenum.  Significant events: - 9/24: NG tube placement - 9/26: tongue swelling --> started om solu medrol, pepcid, and benadryl prn - 9/27: NG tube came out - 9/29: IR consulted for possible gastrostomy placement  Central access: PICC placed on 9/26 TPN start date: 9/27  Nutritional Goals (per RD recommendation  on 9/27): kCal: 1250-1450, Protein: 60-75, Fluid: 1.5 L/day  Goal TPN rate is 65 mL/hr (provides 65 g of protein and 1310 kcals per day)  Current Nutrition:  - NPO - TPN  Plan:  Now:   Potassium phosphate 10 mmol IV once  KCl 10 mEq IV x3 runs  At 1800:  Continue TPN at goal rate of 65 mL/hr  Electrolytes in TPN: No changes  10mEq/L of Na, 57mEq/L of K, 39mEq/L of Ca, 39mEq/L of Mg, and 90mmol/L of Phos.   Cl:Ac ratio max CL  Add standard MVI and trace elements to TPN  Continue sSSI q4h and adjust as needed  MIVF discontinued per MD  Recheck electrolytes with AM labs tomorrow as pt high risk for refeeding syndrome  Monitor TPN labs on Mon/Thurs  Lenis Noon, PharmD 04/12/2020,10:52 AM

## 2020-04-12 NOTE — Evaluation (Signed)
Clinical/Bedside Swallow Evaluation Patient Details  Name: Claudia Wiley MRN: [id number] Date of Birth: 1955-12-02  Today's Date: 04/12/2020 Time: SLP Start Time (ACUTE ONLY): 1155 SLP Stop Time (ACUTE ONLY): 1230 SLP Time Calculation (min) (ACUTE ONLY): 35 min  Past Medical History:  Past Medical History:  Diagnosis Date  . Anemia   . Arthritis   . Cancer [facility]) 2013   Upper left lung and stomach cancer  . COPD (chronic obstructive pulmonary disease) ([place])   . Esophageal stricture   . Family history of brain cancer   . Family history of melanoma   . Gastrinoma   . Gastrinoma   . GERD (gastroesophageal reflux disease)   . Hypertension   . Osteoporosis   . Pancreatic insufficiency   . Pneumonia   . PONV (postoperative nausea and vomiting)   . Vitamin D deficiency   . Weight loss, unintentional    Past Surgical History:  Past Surgical History:  Procedure Laterality Date  . ABDOMINAL HYSTERECTOMY    . BIOPSY  09/11/2019   Procedure: BIOPSY;  Surgeon: Lavena Bullion, DO;  Location: [facility];  Service: Gastroenterology;;  . BIOPSY  10/03/2019   Procedure: BIOPSY;  Surgeon: Jerene Bears, MD;  Location: [facility];  Service: Gastroenterology;;  . BIOPSY  04/07/2020   Procedure: BIOPSY;  Surgeon: Lavena Bullion, DO;  Location: [facility];  Service: Gastroenterology;;  . BREAST LUMPECTOMY    . CESAREAN SECTION    . ESOPHAGEAL DILATION  03/13/2020   Procedure: ESOPHAGEAL DILATION;  Surgeon: [name], MD;  Location: [facility];  Service: Endoscopy;;  . ESOPHAGOGASTRODUODENOSCOPY (EGD) WITH PROPOFOL N/A 09/11/2019   Procedure: ESOPHAGOGASTRODUODENOSCOPY (EGD) WITH PROPOFOL;  Surgeon: Lavena Bullion, DO;  Location: [facility];  Service: Gastroenterology;  Laterality: N/A;  . ESOPHAGOGASTRODUODENOSCOPY (EGD) WITH PROPOFOL N/A 10/03/2019   Procedure: ESOPHAGOGASTRODUODENOSCOPY (EGD) WITH PROPOFOL;  Surgeon: Jerene Bears, MD;  [facility]: [facility];   Service: Gastroenterology;  Laterality: N/A;  . ESOPHAGOGASTRODUODENOSCOPY (EGD) WITH PROPOFOL N/A 10/19/2019   Procedure: ESOPHAGOGASTRODUODENOSCOPY (EGD) WITH PROPOFOL;  Surgeon: Yetta Flock, MD;  [facility]: [facility];  Service: Gastroenterology;  Laterality: N/A;  possible dilation  . ESOPHAGOGASTRODUODENOSCOPY (EGD) WITH PROPOFOL N/A 11/02/2019   Procedure: ESOPHAGOGASTRODUODENOSCOPY (EGD) WITH FLUORO AND  WITH PROPOFOL;  Surgeon: [name], MD;  [facility]: [facility];  Service: Endoscopy;  Laterality: N/A;  need fluoro  . ESOPHAGOGASTRODUODENOSCOPY (EGD) WITH PROPOFOL N/A 11/16/2019   Procedure: ESOPHAGOGASTRODUODENOSCOPY (EGD) WITH PROPOFOL;  Surgeon: [name], MD;  [facility];  Service: Endoscopy;  Laterality: N/A;  . ESOPHAGOGASTRODUODENOSCOPY (EGD) WITH PROPOFOL N/A 11/29/2019   Procedure: ESOPHAGOGASTRODUODENOSCOPY (EGD) WITH PROPOFOL;  Surgeon: [name], MD;  [facility];  Service: Endoscopy;  Laterality: N/A;  need fluoro  . ESOPHAGOGASTRODUODENOSCOPY (EGD) WITH PROPOFOL N/A 12/21/2019   Procedure: ESOPHAGOGASTRODUODENOSCOPY (EGD) WITH PROPOFOL;  Surgeon: [name], MD;  [facility];  Service: Endoscopy;  Laterality: N/A;  . ESOPHAGOGASTRODUODENOSCOPY (EGD) WITH PROPOFOL N/A 02/07/2020   Procedure: ESOPHAGOGASTRODUODENOSCOPY (EGD) WITH PROPOFOL;  Surgeon: [name], MD;  [facility];  Service: Endoscopy;  Laterality: N/A;  . ESOPHAGOGASTRODUODENOSCOPY (EGD) WITH PROPOFOL N/A 03/13/2020   Procedure: ESOPHAGOGASTRODUODENOSCOPY (EGD) WITH PROPOFOL;  Surgeon: [name], MD;  [facility];  Service: Endoscopy;  Laterality: N/A;  With dilation  . ESOPHAGOGASTRODUODENOSCOPY (EGD) WITH PROPOFOL N/A 04/07/2020   Procedure: ESOPHAGOGASTRODUODENOSCOPY (EGD) WITH PROPOFOL;  Surgeon: Lavena Bullion, DO;  [facility];  Service: Gastroenterology;  Laterality: N/A;  . LUNG SURGERY    . SAVORY  DILATION N/A 10/19/2019   Procedure: SAVORY DILATION;  Surgeon: Yetta Flock, MD;  [facility];  Service: Gastroenterology;  Laterality: N/A;  . SAVORY DILATION N/A 11/02/2019   Procedure: SAVORY DILATION;  Surgeon: [name], MD;  Location: [facility];  Service: Endoscopy;  Laterality: N/A;  fluoro  . SAVORY DILATION N/A 11/16/2019   Procedure: SAVORY DILATION;  Surgeon: [name], MD;  [facility];  Service: Endoscopy;  Laterality: N/A;  . SAVORY DILATION N/A 11/29/2019   Procedure: SAVORY DILATION;  Surgeon: [name], MD;  [facility];  Service: Endoscopy;  Laterality: N/A;  . SAVORY DILATION N/A 12/21/2019   Procedure: SAVORY DILATION;  Surgeon: [name], MD;  [facility];  Service: Endoscopy;  Laterality: N/A;  . SAVORY DILATION N/A 02/07/2020   Procedure: SAVORY DILATION;  Surgeon: [name], MD;  Location: [facility];  Service: Endoscopy;  Laterality: N/A;   HPI:  64yo female admitted 04/06/20 with N/V, diarrhea. PMH: anemia, COPD, gastric and lung cancer, esophageal stricture, gastrinoma, GERD, HTN, pancreatic insufficiency, weight loss. Pt has esophageal dilation every 2-4 weeks. CXR = no active disease   Assessment / Plan / Recommendation Clinical Impression  Pt seen at bedside for evaluation of swallow function and identification of PO diet. CN exam unremarkable. Pt reports she has dentures at home, but does not usually wear them when eating. Pt accepted trials of thin liquid, puree, and solid textures. She is significantly sensitive to cold things or too much in her mouth at once, and she prefers her medications in liquid form. There was no obvious oral issue, or overt s/s aspiration on thin, puree, or solid texture. Pt is at increased aspiration risk due to known long standing esophageal dysphagia. BSE was completed in 2019 and a MBS was recommended at that time. MBS is not recommended based on today's  presentation, but it remains an option if the need arises. We will follow briefly for education. Safe swallow precautions posted at [facility]. RN and MD informed.   SLP Visit Diagnosis: Dysphagia, unspecified (R13.10)    Aspiration Risk  Moderate aspiration risk;Mild aspiration risk    Diet Recommendation Dysphagia 2 (Fine chop);Thin liquid   Liquid Administration via: Cup;Straw Medication Administration: Other (Comment) (pt prefers meds in liquid form) Supervision: Patient able to self feed Compensations: Minimize environmental distractions;Slow rate;Small sips/bites Postural Changes: Seated upright at 90 degrees;Remain upright for at least 30 minutes after po intake    Other  Recommendations Oral Care Recommendations: Oral care BID   Follow up Recommendations None      Frequency and Duration min 1 x/week  1 week;2 weeks       Prognosis Prognosis for Safe Diet Advancement: Fair Barriers to Reach Goals: Severity of deficits;Time post onset      Swallow Study   General Date of Onset: 04/06/20 HPI: 64yo female admitted 04/06/20 with N/V, diarrhea. PMH: anemia, COPD, gastric and lung cancer, esophageal stricture, gastrinoma, GERD, HTN, pancreatic insufficiency, weight loss. Pt has esophageal dilation every 2-4 weeks. CXR = no active disease Type of Study: Bedside Swallow Evaluation Previous Swallow Assessment: 06/18/2018 - rec MBS, suspect primary esophageal dysphagia Diet Prior to this Study: NPO Temperature Spikes Noted: No Respiratory Status: Nasal cannula History of Recent Intubation: No Behavior/Cognition: Alert;Cooperative;Pleasant mood Oral Cavity Assessment: Within Functional Limits Oral Care Completed by SLP: No Oral Cavity - Dentition: Dentures, not available;Edentulous Vision:  Functional for self-feeding Self-Feeding Abilities: Able to feed self Patient Positioning: Upright in bed Baseline Vocal Quality: Other (comment);Hoarse (dysphonic) Volitional Cough:  Weak Volitional Swallow: Able to elicit    Oral/Motor/Sensory Function Overall Oral Motor/Sensory Function: Within functional limits   Thin Liquid Presentation: Straw;Self Fed Other Comments: Pt prefers all PO room temp    Puree Puree: Within functional limits Presentation: Spoon Other Comments: Pt is highly sensitive to cold - took very small boluses   Solid     Solid: Within functional limits Presentation: Self Fed Other Comments: Pt takes very small bites - can't handle cracker softened with cracker "too much in my mouth", extended oral prep due to edentulous.     Aniaya Bacha B. Quentin Ore, [facility], [place] Speech Language Pathologist Office: [phone] Pager: [phone]  Shonna Chock 04/12/2020,12:45 PM

## 2020-04-12 NOTE — Progress Notes (Addendum)
[facility] Progress Note  CC:  N/V, dysphagia, esophageal strictures   Subjective: She is having right lower back pain. Her daughter noted swelling to her back this am when she assisted her mother out of bed and to the commode. She passes a dark green loose stool this am. No N/V since NGT was inadvertently pulled out yesterday.    Objective:   Vital signs in last 24 hours: Temp:  [98.2 F (36.8 C)-98.8 F (37.1 C)] 98.8 F (37.1 C) (09/29 0452) Pulse Rate:  [85-92] 92 (09/29 0452) Resp:  [14-16] 16 (09/29 0452) BP: (131-147)/(78-84) 131/78 (09/29 0452) SpO2:  [96 %-100 %] 100 % (09/29 0758) Last BM Date: 04/11/20 General: Cachectic frail appearing female. Alert and awake.  Heart: RRR, no murmur.  Pulm:  Diminished breath sounds throughout.  Abdomen: Flat, nondistended. Generalized tenderness without rebound or guarding. Hypoactive BS x 4 quads. Upper abdominal and lower abdominal midline scars intact.  Extremities:  Without edema. Neurologic:  Alert and  oriented x4. Generalized weakness. Speech is softly spoken but clear.  Psych:  Alert and cooperative. Normal mood and affect  Lab Results: Recent Labs    04/10/20 1520 04/11/20 0757  WBC 5.9 14.9*  HGB 10.0* 11.1*  HCT 33.1* 37.0  PLT 170 202   BMET Recent Labs    04/10/20 1005 04/11/20 0757 04/12/20 0808  NA 139 139 144  K 4.4 4.5 3.6  CL 91* 91* 94*  CO2 40* 42* 44*  GLUCOSE 178* 161* 112*  BUN 7* 7* 11  CREATININE 0.40* 0.42* 0.34*  CALCIUM 8.8* 9.0 8.9   LFT Recent Labs    04/12/20 0808  PROT 5.3*  ALBUMIN 3.0*  AST 51*  ALT 33  ALKPHOS 40  BILITOT 0.4   PT/INR No results for input(s): LABPROT, INR in the last 72 hours. Hepatitis Panel No results for input(s): HEPBSAG, HCVAB, HEPAIGM, HEPBIGM in the last 72 hours.  DG UGI W SINGLE CM (SOL OR THIN BA)  Result Date: 04/10/2020 CLINICAL DATA:  Duodenal stenosis. EXAM: UPPER GI SERIES WITH KUB TECHNIQUE: After obtaining a  scout radiograph a routine upper GI series was performed using water-soluble contrast. FLUOROSCOPY TIME:  Fluoroscopy Time:  1 minutes, 48 seconds Radiation Exposure Index (if provided by the fluoroscopic device): 13.20 mGy Number of Acquired Spot Images: 3 COMPARISON:  Abdominal radiographs 04/07/2020. CT abdomen/pelvis 04/06/2020. FINDINGS: A scout radiograph of the abdomen demonstrates a post-pyloric enteric tube. No dilated loops of bowel within the visualized abdomen. Surgical clips within the right lower quadrant. No acute bony abnormality. A problem-oriented water-soluble single contrast upper GI series was performed by injecting the enteric tube. There is contrast opacification of the proximal duodenum. There is narrowing of the third portion of the duodenum at the level of the SMA. However, contrast advances to the fourth portion of the duodenum without significant delay. The duodenal sweep is otherwise unremarkable. IMPRESSION: Problem-oriented water-soluble single contrast upper GI series as described. There is narrowing of the third portion of the duodenum at the level of the SMA, which may reflect a component of SMA compression. However, contrast advances from the 3rd to 4th portion of the duodenum without significant delay with the patient in the supine position. The duodenal sweep is otherwise unremarkable. Electronically Signed   By: Kellie Simmering DO   On: 04/10/2020 09:52    Assessment / Plan:  34. 64 year old female with a history of acute esophageal necrosis, esophageal strictures requiring multiple esophageal  dilatations. Admitted to the hospital 04/07/2020 with N/V and dysphagia. CTAP showed thickening in the esophagus with proximal duodenal dilatation suspicious for SMA syndrome in setting of recent weight loss. S/P EGD 04/06/2020 identified multiple benign appearing intrinsic stenoses throughout the esophagus, food residue in the stomach, hiatal hernia, multiple deep duodenal ulcers and an  extrinsic duodenal stenosis in the 3rd portion of the duodenum consistent with extraluminal compression by the SMA. NGT placed during EGD. UGI 9/27 showed narrowing of the 3rd portion of the duodenum which suggests a component of SMA compression, however, contrast advanced to the duodenum without delay. No further endoscopic procedures warranted. Recommend feeding tube placement distal to the site of SMA compression by general surgery. NGT fell out last night. Patient refuses to have NGT replaced at this time. -No plans for endoscopic evaluation as patient needs feeding tube placement -Recommend placement of feeding tube distal to the site of SMA compression which was discussed by Dr. Loletha Carrow and Dr. Hassell Done, refer to GI progress note on 04/11/2020. Nutritional support via tube feedings is imperative. She will not recover from her severe malnutrition from po intake alone.  -SLP eval ordered by Dr. Sabino Gasser, await results prior to allowing clear liquids  -She will require eventual outpatient follow up in our GI clinic   2. History of a gastrinoma s/p exploratory laparoscopy, lysis of adhesions and Kocher maneuver and resection of peripancreatic gastrinoma done in [place] 02/11/2014.   3. Severely Malnourished secondary to # 1.  -See Plan in # 1  4. Normocytic Anemia, most likely due to severe malnutrition.  Hg 10.4 -> 11.1.  Iron 19. Ferritin 17. No overt GI bleeding.  -Recommend IV iron   5. History of Lung cancer s/p LUL lobectomy 09/2013.  6. Leukocytosis. WBC 14.9. She is afebrile. Solumedrol was dc'd.   7. Right lower back pain with questionable swelling. -Defer management to the hospitalist      LOS: 5 days   Noralyn Pick  04/12/2020, 9:39 AM  ________________________________________________________________________  Velora Heckler GI MD note:  I personally examined the patient, reviewed the data and agree with the assessment and plan described above.  Greatly appreciate surgical  and IR opinions and assistance with her care. I think GJ tube is a nice solution to her problem and it will allow her to get adequate nutrition rather than continuing to wither. We will continue to work on her unusual esophageal stricturing process but now she will not have to struggle so much with oral intake to meet her nutritional needs.   Owens Loffler, MD [facility] Pager [phone]

## 2020-04-12 NOTE — Progress Notes (Signed)
PROGRESS NOTE    Claudia Wiley   QZR:[id number]  DOB: 10/05/1955  DOA: 04/06/2020     5  PCP: [facility]  CC: abd pain  Hospital Course: [name] is a 64 year old female with PMH of COPD, chronic hypoxic respiratory failure on 2 L/min home oxygen, HTN, reported prior history of gastrinoma s/p surgery in [place] in 2014-2015, lung cancer s/p left upper lobectomy 2014-2015, sepsis related acute necrotic esophagus February 2021, since then multiple esophageal strictures and has undergone several EGDs with dilatation as outpatient and most recent procedure was on 03/13/2020 when she had multiple benign strictures in the mid to distal esophagus that were dilated, presented to the ED with 3 to 4 days history of diffuse abdominal and lower chest discomfort, nausea, vomiting, inability to keep down oral intake and diarrhea.    General surgery and [facility] consulting.  S/p EGD, esophageal stricture dilatation, placement of NG tube during endoscopy on 9/24.  PICC line placed 9/26, TNA started 9/27. NGT came out overnight of 04/10/20 accidentally and patient was too nervous for replacement but denied any N/V nor abd pain.  After further discussion with GI and surgery, patient will undergo placement of percutaneous gastrostomy tube with eventual conversion to [place] tube.  She also underwent evaluation by SLP and had no obvious oral issues or overt aspiration.  She is of course at increased risk for aspiration due to her longstanding issues with esophageal dilations and strictures.  She was considered appropriate for initiating dysphagia level 2 diet with thin liquids if and when appropriate once procedures are done.  Interval History:  No events overnight. Daughter bedside this am. Tearful but after further explanation of upcoming planned procedures, was a little more re-assured.   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  Initial CT abdomen showed distal esophageal wall thickening, dilated stomach and dilated proximal duodenum to the level of the SMA.  - S/p EGD 9/24, esophageal stricture dilatation, placement of NG tube, showed multiple duodenal ulcers which were biopsied and showed endoscopic appearance consistent with extraluminal compression by the SMA.  Treatment of SMA syndrome would be to improve calorie intake and restore fat pad in between SMA and duodenum.   - GI and General surgery input appreciated.  No surgical interventions at this time.  Continue supportive treatment (NGT displaced on 9/27 but no N/V currently) - Need to improve nutrition to restore fat pad in between SMA and duodenum.   - tentative plan is for IR insertion of G-tube (on 9/30) with eventual GJ tube conversion - will transition off TPN to enteral feeds after tube placed  Hypoglycemia: - transient; now s/p D5 - d/c IVF and monitor CBG; change to q4h monitoring   Chronic dysphagia/recurrent esophageal strictures: Recurrent benign esophageal strictures due to previous acute necrotic esophagus and Candida esophagitis.  Requiring multiple EGDs and dilatations.  Had EGD and dilatation of strictures again on 9/24. - SLP eval on 9/29; okay for dysphagia level 2 thin liquids; continue aspiration precautions too  Pruritus and tongue swelling: Reported by patient, family and patient's RN on 9/26.  Reportedly having diffuse body itching.?  Tongue swelling without thrush.  Unclear etiology.  Unsure if this is an allergic reaction.  No new medications started -  overall has improved and no further swelling appreciated - d/c benadryl, pepcid, and solu-medrol and monitor   Anemia: Hemoglobin is dropped from 13.1-10.4  in the absence of overt bleeding.?  Dilutional.  Follow CBC.  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  COPD/chronic respiratory failure with hypoxia on home oxygen 2 L/min: No clinical bronchospasm.  Essential hypertension: Controlled today.  Hypophosphatemia: Replace in TNA.  Body mass index is 14.12 kg/m.  Nutritional Status Nutrition Problem: Severe Malnutrition Etiology: chronic illness (COPD, recurrent esophageal strictures) Signs/Symptoms: percent weight loss, energy intake < or equal to 75% for > or equal to 1 month, severe fat depletion, severe muscle depletion Percent weight loss: 22 % (x 9 months) Interventions: Refer to RD note for recommendations  Antimicrobials: none  DVT prophylaxis: HSQ Code Status: Full Family Communication: none Disposition Plan: Status is: Inpatient  Remains inpatient appropriate because:IV treatments appropriate due to intensity of illness or inability to take PO and Inpatient level of care appropriate due to severity of illness   Dispo: The patient is from: Home              Anticipated d/c is to: Home              Anticipated d/c date is: > 3 days              Patient currently is not medically stable to d/c.   Objective: Blood pressure 137/85, pulse 82, temperature 98.8 F (37.1 C), temperature source Oral, resp. rate 14, height 4\' 11"  (1.499 m), weight 31.7 kg, SpO2 100 %.  Examination: General appearance: frail and thin woman with hoarse voice laying in bed in NAD Head: Normocephalic, without obvious abnormality, atraumatic Eyes: EOMI Lungs: clear to auscultation bilaterally Heart: regular rate and rhythm and S1, S2 normal Abdomen: TTP in epigastrium, no R/G; BS present Extremities: thin, no edema Skin: mobility and turgor normal Neurologic: weak but no focal deficits   Consultants:   GI  Surgery  Procedures:  EGD 9/24 NG tube placed during EGD  9/24 PICC line right upper extremity 9/26  Data Reviewed: I have personally reviewed following labs and imaging studies Results for orders placed or performed during the hospital encounter of 04/06/20 (from the past 24 hour(s))  Glucose, capillary     Status: Abnormal   Collection Time: 04/11/20 10:22 PM  Result Value Ref Range   Glucose-Capillary 174 (H) 70 - 99 mg/dL  Glucose, capillary     Status: Abnormal   Collection Time: 04/12/20  1:00 AM  Result Value Ref Range   Glucose-Capillary 165 (H) 70 - 99 mg/dL  Glucose, capillary     Status: Abnormal   Collection Time: 04/12/20  4:20 AM  Result Value Ref Range   Glucose-Capillary 128 (H) 70 - 99 mg/dL  Glucose, capillary     Status: Abnormal   Collection Time: 04/12/20  7:28 AM  Result Value Ref Range   Glucose-Capillary 112 (H) 70 - 99 mg/dL  Comprehensive metabolic panel     Status: Abnormal   Collection Time: 04/12/20  8:08 AM  Result Value Ref Range   Sodium 144 135 - 145 mmol/L   Potassium 3.6 3.5 - 5.1 mmol/L   Chloride 94 (L) 98 - 111 mmol/L   CO2 44 (H) 22 - 32 mmol/L   Glucose, Bld 112 (H) 70 - 99 mg/dL   BUN 11 8 - 23 mg/dL   Creatinine, Ser 0.34 (L) 0.44 -  1.00 mg/dL   Calcium 8.9 8.9 - 10.3 mg/dL   Total Protein 5.3 (L) 6.5 - 8.1 g/dL   Albumin 3.0 (L) 3.5 - 5.0 g/dL   AST 51 (H) 15 - 41 U/L   ALT 33 0 - 44 U/L   Alkaline Phosphatase 40 38 - 126 U/L   Total Bilirubin 0.4 0.3 - 1.2 mg/dL   GFR calc non Af Amer >60 >60 mL/min   GFR calc Af Amer >60 >60 mL/min   Anion gap 6 5 - 15  Magnesium     Status: None   Collection Time: 04/12/20  8:08 AM  Result Value Ref Range   Magnesium 2.0 1.7 - 2.4 mg/dL  Phosphorus     Status: Abnormal   Collection Time: 04/12/20  8:08 AM  Result Value Ref Range   Phosphorus 2.1 (L) 2.5 - 4.6 mg/dL  Glucose, capillary     Status: Abnormal   Collection Time: 04/12/20 11:51 AM  Result Value Ref Range   Glucose-Capillary 128 (H) 70 - 99 mg/dL  Glucose, capillary     Status:  Abnormal   Collection Time: 04/12/20  4:14 PM  Result Value Ref Range   Glucose-Capillary 133 (H) 70 - 99 mg/dL    Recent Results (from the past 240 hour(s))  Blood culture (routine single)     Status: None   Collection Time: 04/06/20  9:30 PM   Specimen: BLOOD  Result Value Ref Range Status   Specimen Description   Final    BLOOD LEFT ANTECUBITAL Performed at [facility], [place] [address] [place], [place] [address]    Special Requests   Final    BOTTLES DRAWN AEROBIC AND ANAEROBIC Blood Culture results may not be optimal due to an excessive volume of blood received in culture bottles Performed at [place] [address], [place], [place] [address]    Culture   Final    NO GROWTH 5 DAYS Performed at [facility], [place] [address], [place], [place] [address]    Report Status 04/12/2020 FINAL  Final  Urine culture     Status: None   Collection Time: 04/06/20  9:30 PM   Specimen: In/Out Cath Urine  Result Value Ref Range Status   Specimen Description   Final    IN/OUT CATH URINE Performed at [place] [address] [place], [place] [address]    Special Requests   Final    NONE Performed at [facility], [place] [address], [place], [place] [address]    Culture   Final    NO GROWTH Performed at [facility], [place] [address], [place], [place] [address]    Report Status 04/08/2020 FINAL  Final  Respiratory Panel by RT PCR (Flu A&B, Covid) - Nasopharyngeal Swab     Status: None   Collection Time: 04/06/20 10:26 PM   Specimen: Nasopharyngeal Swab  Result Value Ref Range Status   SARS Coronavirus 2 by RT PCR NEGATIVE NEGATIVE Final    Comment: (NOTE) SARS-CoV-2 target nucleic acids are NOT DETECTED.  The SARS-CoV-2 RNA is generally detectable in upper respiratoy specimens during the acute phase of infection. The lowest concentration of SARS-CoV-2 viral copies this assay can detect  is 131 copies/mL. A negative result does not preclude SARS-Cov-2 infection and should not be used as the sole basis for treatment or other patient management decisions. A negative result may occur with  improper specimen collection/handling, submission  of specimen other than nasopharyngeal swab, presence of viral mutation(s) within the areas targeted by this assay, and inadequate number of viral copies (<131 copies/mL). A negative result must be combined with clinical observations, patient history, and epidemiological information. The expected result is Negative.  Fact Sheet for Patients:  PinkCheek.be  Fact Sheet for Healthcare Providers:  GravelBags.it  This test is no t yet approved or cleared by the [place] FDA and  has been authorized for detection and/or diagnosis of SARS-CoV-2 by FDA under an Emergency Use Authorization (EUA). This EUA will remain  in effect (meaning this test can be used) for the duration of the COVID-19 declaration under Section 564(b)(1) of the Act, 21 U.S.C. section 360bbb-3(b)(1), unless the authorization is terminated or revoked sooner.     Influenza A by PCR NEGATIVE NEGATIVE Final   Influenza B by PCR NEGATIVE NEGATIVE Final    Comment: (NOTE) The Xpert Xpress SARS-CoV-2/FLU/RSV assay is intended as an aid in  the diagnosis of influenza from Nasopharyngeal swab specimens and  should not be used as a sole basis for treatment. Nasal washings and  aspirates are unacceptable for Xpert Xpress SARS-CoV-2/FLU/RSV  testing.  Fact Sheet for Patients: PinkCheek.be  Fact Sheet for Healthcare Providers: GravelBags.it  This test is not yet approved or cleared by the [place] FDA and  has been authorized for detection and/or diagnosis of SARS-CoV-2 by  FDA under an Emergency Use Authorization (EUA). This EUA will remain  in effect  (meaning this test can be used) for the duration of the  Covid-19 declaration under Section 564(b)(1) of the Act, 21  U.S.C. section 360bbb-3(b)(1), unless the authorization is  terminated or revoked. Performed at [facility], [place] [address] [place], [place] [address]      Radiology Studies: No results found. DG UGI W SINGLE CM (SOL OR THIN BA)  Final Result    [place] EKG SITE RITE  Final Result    DG Abd 1 View - KUB  Final Result    CT ABDOMEN PELVIS W CONTRAST  Final Result    DG Chest Port 1 View  Final Result    IR GASTROSTOMY TUBE MOD SED    (Results Pending)    Scheduled Meds: . Chlorhexidine Gluconate Cloth  6 each Topical Daily  . fluticasone furoate-vilanterol  1 puff Inhalation Daily  . [START ON 04/13/2020] heparin  5,000 Units Subcutaneous Q8H  . insulin aspart  0-9 Units Subcutaneous Q4H  . pantoprazole (PROTONIX) IV  40 mg Intravenous Q12H  . sodium chloride flush  10-40 mL Intracatheter Q12H  . sucralfate  1 g Per Tube TID WC & HS  . umeclidinium bromide  1 puff Inhalation Daily   PRN Meds: albuterol, diphenhydrAMINE, morphine injection, ondansetron **OR** ondansetron (ZOFRAN) IV, phenol, sodium chloride flush Continuous Infusions: . [START ON 04/13/2020]  ceFAZolin (ANCEF) IV    . [COMPLETED] potassium PHOSPHATE IVPB (in mmol) 10 mmol (04/12/20 1524)  . TPN ADULT (ION) 65 mL/hr at 04/11/20 1739  . TPN ADULT (ION)        LOS: 5 days  Time spent: Greater than 50% of the 35 minute visit was spent in counseling/coordination of care for the patient as laid out in the A&P.   Dwyane Dee, MD [facility] 04/12/2020, 5:22 PM  Contact via secure chat.  To contact the attending provider between 7A-7P or the covering provider during after hours 7P-7A, please log into the web site [email or web address] and access using universal [facility] password for that web site. If you do not have the password, please call the hospital operator.

## 2020-04-12 NOTE — Progress Notes (Signed)
[facility] Progress Note  5 Days Post-Op  Subjective: Patient very sleepy this AM and daughter at bedside. Discussed plan for IR gastrostomy with change to G-J tube to allow for enteral feeding to improve nutrition. Patient reports feeling somewhat queasy but no vomiting.   Objective: Vital signs in last 24 hours: Temp:  [98.2 F (36.8 C)-98.8 F (37.1 C)] 98.8 F (37.1 C) (09/29 0452) Pulse Rate:  [85-92] 92 (09/29 0452) Resp:  [14-16] 16 (09/29 0452) BP: (131-147)/(78-84) 131/78 (09/29 0452) SpO2:  [96 %-100 %] 100 % (09/29 0758) Last BM Date: 04/11/20  Intake/Output from previous day: 09/28 0701 - 09/29 0700 In: 1022 [P.O.:60; I.V.:862; IV Piggyback:100] Out: 200 [Urine:200] Intake/Output this shift: No intake/output data recorded.  PE: General appearance: lethargic, chronically ill appearing, NAD GI: soft, non-tender, bowel sounds normal, no palpable masses   Lab Results:  Recent Labs    04/10/20 1520 04/11/20 0757  WBC 5.9 14.9*  HGB 10.0* 11.1*  HCT 33.1* 37.0  PLT 170 202   BMET Recent Labs    04/11/20 0757 04/12/20 0808  NA 139 144  K 4.5 3.6  CL 91* 94*  CO2 42* 44*  GLUCOSE 161* 112*  BUN 7* 11  CREATININE 0.42* 0.34*  CALCIUM 9.0 8.9   PT/INR No results for input(s): LABPROT, INR in the last 72 hours. CMP     Component Value Date/Time   NA 144 04/12/2020 0808   K 3.6 04/12/2020 0808   CL 94 (L) 04/12/2020 0808   CO2 44 (H) 04/12/2020 0808   GLUCOSE 112 (H) 04/12/2020 0808   BUN 11 04/12/2020 0808   CREATININE 0.34 (L) 04/12/2020 0808   CALCIUM 8.9 04/12/2020 0808   PROT 5.3 (L) 04/12/2020 0808   ALBUMIN 3.0 (L) 04/12/2020 0808   AST 51 (H) 04/12/2020 0808   ALT 33 04/12/2020 0808   ALKPHOS 40 04/12/2020 0808   BILITOT 0.4 04/12/2020 0808   GFRNONAA >60 04/12/2020 0808   GFRAA >60 04/12/2020 0808   Lipase     Component Value Date/Time   LIPASE 21 10/01/2019 1644       Studies/Results: DG UGI W SINGLE CM  (SOL OR THIN BA)  Result Date: 04/10/2020 CLINICAL DATA:  Duodenal stenosis. EXAM: UPPER GI SERIES WITH KUB TECHNIQUE: After obtaining a scout radiograph a routine upper GI series was performed using water-soluble contrast. FLUOROSCOPY TIME:  Fluoroscopy Time:  1 minutes, 48 seconds Radiation Exposure Index (if provided by the fluoroscopic device): 13.20 mGy Number of Acquired Spot Images: 3 COMPARISON:  Abdominal radiographs 04/07/2020. CT abdomen/pelvis 04/06/2020. FINDINGS: A scout radiograph of the abdomen demonstrates a post-pyloric enteric tube. No dilated loops of bowel within the visualized abdomen. Surgical clips within the right lower quadrant. No acute bony abnormality. A problem-oriented water-soluble single contrast upper GI series was performed by injecting the enteric tube. There is contrast opacification of the proximal duodenum. There is narrowing of the third portion of the duodenum at the level of the SMA. However, contrast advances to the fourth portion of the duodenum without significant delay. The duodenal sweep is otherwise unremarkable. IMPRESSION: Problem-oriented water-soluble single contrast upper GI series as described. There is narrowing of the third portion of the duodenum at the level of the SMA, which may reflect a component of SMA compression. However, contrast advances from the 3rd to 4th portion of the duodenum without significant delay with the patient in the supine position. The duodenal sweep is otherwise unremarkable. 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 They could not determine whether the neuroendocrine tumor was metastatic.No pancreatic tissue mentioned on path report. 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 ?SMA compression. Contrast advances into the 3rd and 4th portions with delay, in the supine position.  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 -Followed by [facility] with multiple esophageal dilatations for chronic esophageal stricture;DilatedFriday 9/24 -Dr. Bryan Lemma Severe malnutrition -BMI 13.67/prealbumin 6.3>10.2 (9/28)  FEN: NPO, TPN ID: None DVT: SCDs, SQ heparin  Follow-up: TBD  At this time patient is at very high risk of complication with any surgical intervention. Have consulted IR for gastrostomy placement with eventual exchange to gastrojejunostomy for enteral feeding to try to get patient to a better nutritional state.   LOS: 5 days    Norm Parcel , [facility] Surgery 04/12/2020, 9:39 AM Please see Amion for pager number during day hours 7:00am-4:30pm

## 2020-04-12 NOTE — Consult Note (Signed)
Chief Complaint: Patient was seen in consultation today for percutaneous gastrostomy tube placement Chief Complaint  Patient presents with  . Dehydration  . Diarrhea     Referring Physician(s): Martin,M  Supervising Physician: Suttle,D  Patient Status: [facility] - In-pt  History of Present Illness: Claudia Wiley is a 64 y.o. female with past medical history significant for anemia, arthritis, lung cancer with prior left upper lobectomy 2014/2015 in [place], COPD, hypertension, esophageal strictures, GERD, hypertension, osteoporosis, prior exploratory lap, lysis of adhesions, Kocher maneuver and resection of peripancreatic gastrinoma in 2015 in [place] with pathology revealing well-differentiated neuroendocrine tumor.  Patient has undergone multiple esophageal dilatations secondary to acute esophageal necrosis/esophageal strictures.  She was admitted to [facility] on 9/24 with nausea ,vomiting and dysphagia.  CT abdomen pelvis showed thickening in the esophagus with proximal duodenal dilatation suspicious for SMA syndrome in setting of recent weight loss.  Prior EGD on 9/23 revealed multiple benign-appearing intrinsic stenoses throughout the esophagus hiatal hernia, multiple deep duodenal ulcers and an extrinsic duodenal stenosis in the third portion of the duodenum consistent with extraluminal compression by the SMA.  Patient did have NG tube in place, however is currently out.  Upper GI on 9/27 showed narrowing of the third portion of the duodenum which suggested a component of SMA compression, however contrast advanced to duodenum without delay.  Request now received from surgery for percutaneous gastrostomy tube placement with eventual conversion to [place] tube to assist with malnutrition.  Past Medical History:  Diagnosis Date  . Anemia   . Arthritis   . Cancer [facility]) 2013   Upper left lung and stomach cancer  . COPD (chronic obstructive pulmonary disease) ([place])   . Esophageal  stricture   . Family history of brain cancer   . Family history of melanoma   . Gastrinoma   . Gastrinoma   . GERD (gastroesophageal reflux disease)   . Hypertension   . Osteoporosis   . Pancreatic insufficiency   . Pneumonia   . PONV (postoperative nausea and vomiting)   . Vitamin D deficiency   . Weight loss, unintentional     Past Surgical History:  Procedure Laterality Date  . ABDOMINAL HYSTERECTOMY    . BIOPSY  09/11/2019   Procedure: BIOPSY;  Surgeon: Lavena Bullion, DO;  Location: [facility];  Service: Gastroenterology;;  . BIOPSY  10/03/2019   Procedure: BIOPSY;  Surgeon: Jerene Bears, MD;  Location: [facility];  Service: Gastroenterology;;  . BIOPSY  04/07/2020   Procedure: BIOPSY;  Surgeon: Lavena Bullion, DO;  Location: [facility];  Service: Gastroenterology;;  . BREAST LUMPECTOMY    . CESAREAN SECTION    . ESOPHAGEAL DILATION  03/13/2020   Procedure: ESOPHAGEAL DILATION;  Surgeon: [name], MD;  [facility]: [facility];  Service: Endoscopy;;  . ESOPHAGOGASTRODUODENOSCOPY (EGD) WITH PROPOFOL N/A 09/11/2019   Procedure: ESOPHAGOGASTRODUODENOSCOPY (EGD) WITH PROPOFOL;  Surgeon: Lavena Bullion, DO;  [facility];  Service: Gastroenterology;  Laterality: N/A;  . ESOPHAGOGASTRODUODENOSCOPY (EGD) WITH PROPOFOL N/A 10/03/2019   Procedure: ESOPHAGOGASTRODUODENOSCOPY (EGD) WITH PROPOFOL;  Surgeon: Jerene Bears, MD;  [facility];  Service: Gastroenterology;  Laterality: N/A;  . ESOPHAGOGASTRODUODENOSCOPY (EGD) WITH PROPOFOL N/A 10/19/2019   Procedure: ESOPHAGOGASTRODUODENOSCOPY (EGD) WITH PROPOFOL;  Surgeon: Yetta Flock, MD;  [facility];  Service: Gastroenterology;  Laterality: N/A;  possible dilation  . ESOPHAGOGASTRODUODENOSCOPY (EGD) WITH PROPOFOL N/A 11/02/2019   Procedure: ESOPHAGOGASTRODUODENOSCOPY (EGD) WITH FLUORO AND  WITH PROPOFOL;  Surgeon: Henrene Pastor,  Docia Chuck, MD;  Location[facility];  Service: Endoscopy;   Laterality: N/A;  need fluoro  . ESOPHAGOGASTRODUODENOSCOPY (EGD) WITH PROPOFOL N/A 11/16/2019   Procedure: ESOPHAGOGASTRODUODENOSCOPY (EGD) WITH PROPOFOL;  Surgeon: [name], MD;  [facility];  Service: Endoscopy;  Laterality: N/A;  . ESOPHAGOGASTRODUODENOSCOPY (EGD) WITH PROPOFOL N/A 11/29/2019   Procedure: ESOPHAGOGASTRODUODENOSCOPY (EGD) WITH PROPOFOL;  Surgeon: [name], MD;  [facility];  Service: Endoscopy;  Laterality: N/A;  need fluoro  . ESOPHAGOGASTRODUODENOSCOPY (EGD) WITH PROPOFOL N/A 12/21/2019   Procedure: ESOPHAGOGASTRODUODENOSCOPY (EGD) WITH PROPOFOL;  Surgeon: [name], MD;  [facility];  Service: Endoscopy;  Laterality: N/A;  . ESOPHAGOGASTRODUODENOSCOPY (EGD) WITH PROPOFOL N/A 02/07/2020   Procedure: ESOPHAGOGASTRODUODENOSCOPY (EGD) WITH PROPOFOL;  Surgeon: [name], MD;  [facility];  Service: Endoscopy;  Laterality: N/A;  . ESOPHAGOGASTRODUODENOSCOPY (EGD) WITH PROPOFOL N/A 03/13/2020   Procedure: ESOPHAGOGASTRODUODENOSCOPY (EGD) WITH PROPOFOL;  Surgeon: [name], MD;  [facility];  Service: Endoscopy;  Laterality: N/A;  With dilation  . ESOPHAGOGASTRODUODENOSCOPY (EGD) WITH PROPOFOL N/A 04/07/2020   Procedure: ESOPHAGOGASTRODUODENOSCOPY (EGD) WITH PROPOFOL;  Surgeon: Lavena Bullion, DO;  [facility];  Service: Gastroenterology;  Laterality: N/A;  . LUNG SURGERY    . SAVORY DILATION N/A 10/19/2019   Procedure: SAVORY DILATION;  Surgeon: Yetta Flock, MD;  [facility];  Service: Gastroenterology;  Laterality: N/A;  . SAVORY DILATION N/A 11/02/2019   Procedure: SAVORY DILATION;  Surgeon: [name], MD;  [facility];  Service: Endoscopy;  Laterality: N/A;  fluoro  . SAVORY DILATION N/A 11/16/2019   Procedure: SAVORY DILATION;  Surgeon: [name], MD;  [facility];  Service: Endoscopy;  Laterality: N/A;  . SAVORY DILATION N/A 11/29/2019    Procedure: SAVORY DILATION;  Surgeon: [name], MD;  [facility];  Service: Endoscopy;  Laterality: N/A;  . SAVORY DILATION N/A 12/21/2019   Procedure: SAVORY DILATION;  Surgeon: [name], MD;  [facility]: [facility];  Service: Endoscopy;  Laterality: N/A;  . SAVORY DILATION N/A 02/07/2020   Procedure: SAVORY DILATION;  Surgeon: [name], MD;  [facility]: [facility];  Service: Endoscopy;  Laterality: N/A;    Allergies: Azithromycin, Boniva [ibandronic acid], and Gabapentin  Medications: Prior to Admission medications   Medication Sig Start Date End Date Taking? Authorizing Provider  albuterol (PROVENTIL HFA;VENTOLIN HFA) 108 (90 Base) MCG/ACT inhaler Inhale 2 puffs into the lungs every 6 (six) hours as needed for wheezing or shortness of breath.    Yes [provider]  albuterol (PROVENTIL) (2.5 MG/3ML) 0.083% nebulizer solution Take 2.5 mg by nebulization every 6 (six) hours as needed for wheezing or shortness of breath.   Yes [provider]  budesonide-formoterol (SYMBICORT) 160-4.5 MCG/ACT inhaler Inhale 1 puff into the lungs daily. Patient taking differently: Inhale 1 puff into the lungs in the morning and at bedtime.  12/21/19   [name], MD  Calcium Carbonate Antacid (CALCIUM CARBONATE, DOSED IN MG ELEMENTAL CALCIUM,) 1250 MG/5ML SUSP Take 1,250 mg of elemental calcium by mouth daily.    [provider]  Cholecalciferol (VITAMIN D3) LIQD Take 400 Units by mouth daily.    [provider]  ENSURE (ENSURE) Take 237 mLs by mouth 3 (three) times daily between meals.     [provider]  fluticasone (FLONASE) 50 MCG/ACT nasal spray Place 1 spray into both nostrils daily as needed for allergies or rhinitis.    [provider]  mirtazapine (REMERON) 15 MG tablet Take 15 mg by mouth at bedtime. *Crushes Tablet*    [provider]  omeprazole (PRILOSEC) 40 MG capsule Take 40 mg by mouth in the  morning and at bedtime.    [provider]  pantoprazole (PROTONIX) 40 MG tablet Take 1 tablet (40 mg total) by mouth 2 (two) times daily before a meal. Patient not taking: Reported on 01/26/2020 11/29/19   [name], MD  pregabalin (LYRICA) 150 MG capsule Take 150 mg by mouth at bedtime. Open capsule    [provider]  sucralfate (CARAFATE) 1 GM/10ML suspension Take 1 g by mouth in the morning, at noon, in the evening, and at bedtime.    [provider]  Tiotropium Bromide Monohydrate 2.5 MCG/ACT AERS Inhale 2 puffs into the lungs daily.     [provider]     Family History  Problem Relation Age of Onset  . Diabetes Sister        x 3  . Cancer Sister 32       brain that metastized to bone, stomach and other organs  . Melanoma Maternal Aunt   . Heart attack Father   . Pneumonia Brother   . Cancer Other 17       cancer in his chest - nephew  . Colon cancer Neg Hx   . Pancreatic cancer Neg Hx   . Rectal cancer Neg Hx   . Esophageal cancer Neg Hx     Social History   Socioeconomic History  . Marital status: Single    Spouse name: Not on file  . Number of children: Not on file  . Years of education: Not on file  . Highest education level: Not on file  Occupational History  . Not on file  Tobacco Use  . Smoking status: Former Smoker    Packs/day: 0.25    Years: 2.00    Pack years: 0.50    Types: Cigarettes    Quit date: 05/30/2019    Years since quitting: 0.8  . Smokeless tobacco: Never Used  . Tobacco comment: 2 months- quit date  Vaping Use  . Vaping Use: Never used  Substance and Sexual Activity  . Alcohol use: No  . Drug use: No  . Sexual activity: Never  Other Topics Concern  . Not on file  Social History Narrative  . Not on file   Social Determinants of Health   Financial Resource Strain:   . Difficulty of Paying Living Expenses: Not on file  Food Insecurity:   . Worried About Charity fundraiser in the Last Year:  Not on file  . Ran Out of Food in the Last Year: Not on file  Transportation Needs:   . Lack of Transportation (Medical): Not on file  . Lack of Transportation (Non-Medical): Not on file  Physical Activity:   . Days of Exercise per Week: Not on file  . Minutes of Exercise per Session: Not on file  Stress:   . Feeling of Stress : Not on file  Social Connections:   . Frequency of Communication with Friends and Family: Not on file  . Frequency of Social Gatherings with Friends and Family: Not on file  . Attends Religious Services: Not on file  . Active Member of Clubs or Organizations: Not on file  . Attends Archivist Meetings: Not on file  . Marital Status: Not on file      Review  of Systems currently denies fever, headache, nausea, vomiting or bleeding.  She does have lower chest/epigastric discomfort, dyspnea with exertion, occasional cough, abdominal/back pain  Vital Signs: BP 131/78 (BP Location: Left Arm)   Pulse 92   Temp 98.8 F (37.1 C) (Oral)   Resp 16   Ht 4\' 11"  (1.499 m)   Wt 69 lb 14.2 oz (31.7 kg)   SpO2 100%   BMI 14.12 kg/m   Physical Exam thin, frail black female, awake/alert, chest clear to auscultation bilaterally.  Heart with regular rate/ rhythm.  Abdomen soft, positive bowel sounds, some mild diffuse tenderness to palpation.  No lower extremity edema.  Imaging: DG Abd 1 View - KUB  Result Date: 04/07/2020 CLINICAL DATA:  NG tube placement EXAM: ABDOMEN - 1 VIEW COMPARISON:  04/03/2019 FINDINGS: The enteric tube courses through the gastric body and terminates near the pylorus/antrum. The bowel gas pattern is nonspecific. IMPRESSION: Enteric tube projects near the pylorus/antrum. Electronically Signed   By: Constance Holster M.D.   On: 04/07/2020 19:55   CT ABDOMEN PELVIS W CONTRAST  Result Date: 04/06/2020 CLINICAL DATA:  Abdominal pain EXAM: CT ABDOMEN AND PELVIS WITH CONTRAST TECHNIQUE: Multidetector CT imaging of the abdomen and pelvis was  performed using the standard protocol following bolus administration of intravenous contrast. CONTRAST:  81mL OMNIPAQUE IOHEXOL 300 MG/ML  SOLN COMPARISON:  09/09/2019 FINDINGS: Lower chest: The lung bases are clear. The heart size is normal. Hepatobiliary: The liver is normal. Normal gallbladder.There is no biliary ductal dilation. Pancreas: Normal contours without ductal dilatation. No peripancreatic fluid collection. Spleen: Unremarkable. Adrenals/Urinary Tract: --Adrenal glands: Unremarkable. --Right kidney/ureter: No hydronephrosis or radiopaque kidney stones. --Left kidney/ureter: No hydronephrosis or radiopaque kidney stones. --Urinary bladder: The urinary bladder is distended. Stomach/Bowel: --Stomach/Duodenum: There is esophageal wall thickening involving the distal esophagus. The stomach is distended. The proximal duodenum is distended to the level of the SMA. --Small bowel: There is no evidence for small bowel obstruction. --Colon: Unremarkable. --Appendix: Normal. Vascular/Lymphatic: Atherosclerotic calcification is present within the non-aneurysmal abdominal aorta, without hemodynamically significant stenosis. --No retroperitoneal lymphadenopathy. --No mesenteric lymphadenopathy. --No pelvic or inguinal lymphadenopathy. Reproductive: Status post hysterectomy. No adnexal mass. Other: No ascites or free air. There has been significant interval loss of subcutaneous fat since the patient's prior CT. Musculoskeletal. No acute displaced fractures. IMPRESSION: 1. Findings suggestive of developing SMA syndrome as detailed above. 2. Distended urinary bladder. Aortic Atherosclerosis (ICD10-I70.0). Electronically Signed   By: Constance Holster M.D.   On: 04/06/2020 23:13   DG Chest Port 1 View  Result Date: 04/06/2020 CLINICAL DATA:  Shortness of breath, sepsis EXAM: PORTABLE CHEST 1 VIEW COMPARISON:  Chest x-ray 10/03/2019 chest x-ray 10/01/2019 FINDINGS: The heart size and mediastinal contours are within  normal limits. Hyperinflation of the lungs suggestive of emphysema. Pulmonary sutures overlie the left hemithorax. Biapical pleural/pulmonary scarring again noted. No focal consolidation. No pulmonary edema. Blunting of the left costophrenic angle is again noted which may represent scarring versus trace pleural effusion. No pneumothorax. Well-defined symmetric bilateral lower/mid lung zone densities consistent with nipple shadows. No acute osseous abnormality. IMPRESSION: No active cardiopulmonary disease. Electronically Signed   By: Iven Finn M.D.   On: 04/06/2020 21:53   DG UGI W SINGLE CM (SOL OR THIN BA)  Result Date: 04/10/2020 CLINICAL DATA:  Duodenal stenosis. EXAM: UPPER GI SERIES WITH KUB TECHNIQUE: After obtaining a scout radiograph a routine upper GI series was performed using water-soluble contrast. FLUOROSCOPY TIME:  Fluoroscopy Time:  1 minutes, 48  seconds Radiation Exposure Index (if provided by the fluoroscopic device): 13.20 mGy Number of Acquired Spot Images: 3 COMPARISON:  Abdominal radiographs 04/07/2020. CT abdomen/pelvis 04/06/2020. FINDINGS: A scout radiograph of the abdomen demonstrates a post-pyloric enteric tube. No dilated loops of bowel within the visualized abdomen. Surgical clips within the right lower quadrant. No acute bony abnormality. A problem-oriented water-soluble single contrast upper GI series was performed by injecting the enteric tube. There is contrast opacification of the proximal duodenum. There is narrowing of the third portion of the duodenum at the level of the SMA. However, contrast advances to the fourth portion of the duodenum without significant delay. The duodenal sweep is otherwise unremarkable. IMPRESSION: Problem-oriented water-soluble single contrast upper GI series as described. There is narrowing of the third portion of the duodenum at the level of the SMA, which may reflect a component of SMA compression. However, contrast advances from the 3rd to  4th portion of the duodenum without significant delay with the patient in the supine position. The duodenal sweep is otherwise unremarkable. Electronically Signed   By: Kellie Simmering DO   On: 04/10/2020 09:52   [place] EKG SITE RITE  Result Date: 04/09/2020 If Site Rite image not attached, placement could not be confirmed due to current cardiac rhythm.   Labs:  CBC: Recent Labs    04/07/20 0230 04/09/20 0637 04/10/20 1520 04/11/20 0757  WBC 12.7* 8.1 5.9 14.9*  HGB 13.1 10.4* 10.0* 11.1*  HCT 42.6 34.4* 33.1* 37.0  PLT 275 180 170 202    COAGS: Recent Labs    04/06/20 2130  INR 1.1  APTT 33    BMP: Recent Labs    04/09/20 0637 04/10/20 1005 04/11/20 0757 04/12/20 0808  NA 139 139 139 144  K 3.8 4.4 4.5 3.6  CL 98 91* 91* 94*  CO2 32 40* 42* 44*  GLUCOSE 61* 178* 161* 112*  BUN 17 7* 7* 11  CALCIUM 8.7* 8.8* 9.0 8.9  CREATININE 0.47 0.40* 0.42* 0.34*  GFRNONAA >60 >60 >60 >60  GFRAA >60 >60 >60 >60    LIVER FUNCTION TESTS: Recent Labs    04/09/20 0637 04/10/20 1005 04/11/20 0757 04/12/20 0808  BILITOT 0.7 0.1* 0.1* 0.4  AST 23 20 21  51*  ALT 12 12 13  33  ALKPHOS 43 42 41 40  PROT 5.3* 5.5* 5.9* 5.3*  ALBUMIN 3.1* 3.1* 3.4* 3.0*    TUMOR MARKERS: No results for input(s): AFPTM, CEA, CA199, CHROMGRNA in the last 8760 hours.  Assessment and Plan: 63 y.o. female with past medical history significant for anemia, arthritis, lung cancer with prior left upper lobectomy 2014/2015 in [place], COPD, hypertension, esophageal strictures, GERD, hypertension, osteoporosis, prior exploratory lap, lysis of adhesions, Kocher maneuver and resection of peripancreatic gastrinoma in 2015 in [place] with pathology revealing well-differentiated neuroendocrine tumor.  Patient has undergone multiple esophageal dilatations secondary to acute esophageal necrosis/esophageal strictures.  She was admitted to [facility] on 9/24 with nausea ,vomiting and dysphagia.  CT abdomen  pelvis showed thickening in the esophagus with proximal duodenal dilatation suspicious for SMA syndrome in setting of recent weight loss.  Prior EGD on 9/23 revealed multiple benign-appearing intrinsic stenoses throughout the esophagus hiatal hernia, multiple deep duodenal ulcers and an extrinsic duodenal stenosis in the third portion of the duodenum consistent with extraluminal compression by the SMA.  Patient did have NG tube in place, however is currently out.  Upper GI on 9/27 showed narrowing of the third portion of the duodenum which  suggested a component of SMA compression, however contrast advanced to duodenum without delay.  Request now received from surgery for percutaneous gastrostomy tube placement with eventual conversion to [place] tube to assist with malnutrition.  Latest labs include WBC 14.9, hemoglobin 11.1, platelets 202k, potassium 3.6, creatinine 0.34, PT/INR normal, COVID-19 negative.  Case has been reviewed by Dr. Kathlene Cote and discussed with surgical team.Risks and benefits image guided gastrostomy tube placement was discussed with the patient /daughter including, but not limited to the need for a barium enema during the procedure, bleeding, infection, peritonitis and/or damage to adjacent structures.  All of the patient's questions were answered, patient is agreeable to proceed.  Consent signed and in chart.  Procedure scheduled for 9/30  Thank you for this interesting consult.  I greatly enjoyed meeting [facility] and look forward to participating in their care.  A copy of this report was sent to the requesting provider on this date.  Electronically Signed: D. Rowe Robert, PA-C 04/12/2020, 11:38 AM   I spent a total of  30 minutes   in face to face in clinical consultation, greater than 50% of which was counseling/coordinating care for percutaneous gastrostomy tube placement

## 2020-04-13 ENCOUNTER — Inpatient Hospital Stay (HOSPITAL_COMMUNITY): Payer: No Typology Code available for payment source

## 2020-04-13 HISTORY — PX: IR GASTROSTOMY TUBE MOD SED: IMG625

## 2020-04-13 LAB — CBC WITH DIFFERENTIAL/PLATELET
Abs Immature Granulocytes: 0.02 10*3/uL (ref 0.00–0.07)
Basophils Absolute: 0 10*3/uL (ref 0.0–0.1)
Basophils Relative: 0 %
Eosinophils Absolute: 0.3 10*3/uL (ref 0.0–0.5)
Eosinophils Relative: 4 %
HCT: 36.4 % (ref 36.0–46.0)
Hemoglobin: 11.2 g/dL — ABNORMAL LOW (ref 12.0–15.0)
Immature Granulocytes: 0 %
Lymphocytes Relative: 37 %
Lymphs Abs: 3 10*3/uL (ref 0.7–4.0)
MCH: 26.4 pg (ref 26.0–34.0)
MCHC: 30.8 g/dL (ref 30.0–36.0)
MCV: 85.6 fL (ref 80.0–100.0)
Monocytes Absolute: 0.9 10*3/uL (ref 0.1–1.0)
Monocytes Relative: 11 %
Neutro Abs: 3.9 10*3/uL (ref 1.7–7.7)
Neutrophils Relative %: 48 %
Platelets: 203 10*3/uL (ref 150–400)
RBC: 4.25 MIL/uL (ref 3.87–5.11)
RDW: 16.4 % — ABNORMAL HIGH (ref 11.5–15.5)
WBC: 8.1 10*3/uL (ref 4.0–10.5)
nRBC: 0 % (ref 0.0–0.2)

## 2020-04-13 LAB — COMPREHENSIVE METABOLIC PANEL
ALT: 46 U/L — ABNORMAL HIGH (ref 0–44)
AST: 38 U/L (ref 15–41)
Albumin: 3.5 g/dL (ref 3.5–5.0)
Alkaline Phosphatase: 45 U/L (ref 38–126)
Anion gap: 6 (ref 5–15)
BUN: 13 mg/dL (ref 8–23)
CO2: 40 mmol/L — ABNORMAL HIGH (ref 22–32)
Calcium: 9.2 mg/dL (ref 8.9–10.3)
Chloride: 94 mmol/L — ABNORMAL LOW (ref 98–111)
Creatinine, Ser: 0.34 mg/dL — ABNORMAL LOW (ref 0.44–1.00)
GFR calc Af Amer: >60 mL/min (ref >60)
GFR calc non Af Amer: >60 mL/min (ref >60)
Glucose, Bld: 101 mg/dL — ABNORMAL HIGH (ref 70–99)
Potassium: 4.9 mmol/L (ref 3.5–5.1)
Sodium: 140 mmol/L (ref 135–145)
Total Bilirubin: 0.3 mg/dL (ref 0.3–1.2)
Total Protein: 5.7 g/dL — ABNORMAL LOW (ref 6.5–8.1)

## 2020-04-13 LAB — GLUCOSE, CAPILLARY
Glucose-Capillary: 113 mg/dL — ABNORMAL HIGH (ref 70–99)
Glucose-Capillary: 116 mg/dL — ABNORMAL HIGH (ref 70–99)
Glucose-Capillary: 117 mg/dL — ABNORMAL HIGH (ref 70–99)
Glucose-Capillary: 121 mg/dL — ABNORMAL HIGH (ref 70–99)
Glucose-Capillary: 80 mg/dL (ref 70–99)
Glucose-Capillary: 91 mg/dL (ref 70–99)
Glucose-Capillary: 95 mg/dL (ref 70–99)

## 2020-04-13 LAB — PHOSPHORUS: Phosphorus: 4 mg/dL (ref 2.5–4.6)

## 2020-04-13 LAB — MAGNESIUM: Magnesium: 2 mg/dL (ref 1.7–2.4)

## 2020-04-13 MED ORDER — ALBUTEROL SULFATE (2.5 MG/3ML) 0.083% IN NEBU: 2.5000 mg | INHALATION_SOLUTION | Freq: Four times a day (QID) | RESPIRATORY_TRACT | Status: DC | PRN

## 2020-04-13 MED ORDER — MIDAZOLAM HCL 2 MG/2ML IJ SOLN
INTRAMUSCULAR | Status: DC | PRN
  Administered 2020-04-13 (×4): 0.5 mg via INTRAVENOUS

## 2020-04-13 MED ORDER — MIDAZOLAM HCL 2 MG/2ML IJ SOLN
INTRAMUSCULAR | Status: AC
  Filled 2020-04-13: qty 2

## 2020-04-13 MED ORDER — TRAVASOL 10 % IV SOLN: INTRAVENOUS | Status: AC

## 2020-04-13 MED ORDER — FENTANYL CITRATE (PF) 100 MCG/2ML IJ SOLN
INTRAMUSCULAR | Status: AC
Start: 2020-04-13 — End: 2020-04-14
  Filled 2020-04-13: qty 2

## 2020-04-13 MED ORDER — CEFAZOLIN SODIUM-DEXTROSE 2-4 GM/100ML-% IV SOLN
INTRAVENOUS | Status: AC
  Filled 2020-04-13: qty 100

## 2020-04-13 MED ORDER — FENTANYL CITRATE (PF) 100 MCG/2ML IJ SOLN
INTRAMUSCULAR | Status: DC | PRN
  Administered 2020-04-13: 12.5 ug via INTRAVENOUS
  Administered 2020-04-13: 50 ug via INTRAVENOUS
  Administered 2020-04-13: 25 ug via INTRAVENOUS
  Administered 2020-04-13: 12.5 ug via INTRAVENOUS

## 2020-04-13 MED ORDER — OXYCODONE HCL 5 MG PO TABS
5.0000 mg | ORAL_TABLET | Freq: Four times a day (QID) | ORAL | Status: DC | PRN
  Administered 2020-04-13 – 2020-04-14 (×4): 5 mg via ORAL
  Filled 2020-04-13 (×4): qty 1

## 2020-04-13 MED ORDER — IOHEXOL 300 MG/ML  SOLN
50.0000 mL | Freq: Once | INTRAMUSCULAR | Status: AC | PRN
  Administered 2020-04-13: 20 mL via ORAL

## 2020-04-13 MED ORDER — LIDOCAINE-EPINEPHRINE 1 %-1:100000 IJ SOLN
INTRAMUSCULAR | Status: AC
  Filled 2020-04-13: qty 1

## 2020-04-13 MED ORDER — LIDOCAINE-EPINEPHRINE 1 %-1:100000 IJ SOLN
INTRAMUSCULAR | Status: DC | PRN
  Administered 2020-04-13: 20 mL

## 2020-04-13 NOTE — Plan of Care (Signed)
Pt rating pain 8/10.  VS stable.   Problem: Education: Goal: Knowledge of General Education information will improve Description: Including pain rating scale, medication(s)/side effects and non-pharmacologic comfort measures Outcome: Progressing   Problem: Health Behavior/Discharge Planning: Goal: Ability to manage health-related needs will improve Outcome: Progressing   Problem: Clinical Measurements: Goal: Ability to maintain clinical measurements within normal limits will improve Outcome: Progressing Goal: Will remain free from infection Outcome: Progressing Goal: Diagnostic test results will improve Outcome: Progressing Goal: Respiratory complications will improve Outcome: Progressing Goal: Cardiovascular complication will be avoided Outcome: Progressing   Problem: Coping: Goal: Level of anxiety will decrease Outcome: Progressing   Problem: Elimination: Goal: Will not experience complications related to bowel motility Outcome: Progressing Goal: Will not experience complications related to urinary retention Outcome: Progressing   Problem: Safety: Goal: Ability to remain free from injury will improve Outcome: Progressing   Problem: Skin Integrity: Goal: Risk for impaired skin integrity will decrease Outcome: Progressing   Problem: Activity: Goal: Risk for activity intolerance will decrease Outcome: Not Progressing   Problem: Nutrition: Goal: Adequate nutrition will be maintained Outcome: Not Progressing   Problem: Pain Managment: Goal: General experience of comfort will improve Outcome: Not Progressing

## 2020-04-13 NOTE — Progress Notes (Signed)
PT Cancellation Note  Patient Details Name: Claudia Wiley MRN: [id number] DOB: Mar 20, 1956   Cancelled Treatment:   Second attempt: Reason Eval/Treat Not Completed: Other (comment) (RN reports pt in significant pain after returning from IR and recommends we follow up tomorrow. Will hold and follow up as schedule allows.)   Gwynneth Albright PT, DPT Acute Rehabilitation Services  Office [phone] Pager [phone]  04/13/2020 5:22 PM

## 2020-04-13 NOTE — Progress Notes (Signed)
PHARMACY - TOTAL PARENTERAL NUTRITION CONSULT NOTE   Indication: esophageal stricture, malnutrition  Patient Measurements: Height: 4\' 11"  (149.9 cm) Weight: 31.7 kg (69 lb 14.2 oz) IBW/kg (Calculated) : 43.2 TPN AdjBW (KG): 30.7 Body mass index is 14.12 kg/m. 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  Abdominal CT on 9/23 showed findings with concern for "developing SMA syndrome."  EGD on 9/24 showed "multiple benign-appearing esophageal stenoses (dilated), multiple deep duodenal ulcers (biopsied),hiatal hernia and extrinsic duodenal stenosis in the 3rd portion of the duodenum."  Pharmacy consulted to initiate TPN on 9/27.  Glucose / Insulin: no hx DM; BG goal <150 -CBGs at goal, 2 units SSI/24 hrs on sSSI q4h per MD - Solu-Medrol discontinued on 9/28 Electrolytes:   All lytes WNL, K (4.9) on upper end of normal  CO2 (40) remains elevated; Cl (94) remains low Renal: SCr stable/low (crcl~36), BUN WNL LFTs / TGs: AST/ALT wnl, Tbili WNL ; TG WNL (92) Prealbumin / albumin: prealbumin 6.3 (9/25) > 10.2 (9/28); albumin 3.4 (9/28) >3 (9/29) > 3.5 Intake / Output; MIVF: Unmeasured UOP x11.  - No MIVF GI Imaging: - 9/23 abd CT: concern for "developing SMA syndrome Surgeries / Procedures:  9/24 EGD: multiple benign-appearing esophageal stenoses (dilated), multiple deep duodenal ulcers (biopsied), hiatal hernia, and extrinsic duodenal stenosis in the 3rd portion of the duodenum. 9/30: Planned placement of gastrostomy tube by IR   Significant events: - 9/24: NG tube placement - 9/26: tongue swelling --> started om solu medrol, pepcid, and benadryl prn - 9/27: NG tube came out - 9/20: Planning placement of gastrostomy tube  Central access: PICC placed on 9/26 TPN start date: 9/27  Nutritional Goals (per RD recommendation  on 9/27): kCal: 1250-1450, Protein: 60-75, Fluid: 1.5 L/day  Goal TPN rate is 65 mL/hr (provides 65 g of protein and 1310 kcals per day)  Current Nutrition:  - NPO - TPN  Plan:  Received verbal order from MD to taper off TPN today as gastrostomy tube will be placed today for enteral nutrition.  Will reduce TPN to half-rate (30 mL/hr) now and turn off at 1800 this evening.   All associated TPN labs will be discontinued with the exception of q4h CBG check + SSI that was ordered by MD.  Pharmacy to sign off, please re-consult if needed.   Lenis Noon, PharmD 04/13/2020,11:47 AM

## 2020-04-13 NOTE — Progress Notes (Signed)
6 Days Post-Op    CC: Abdominal pain  Subjective: Patient appears comfortable this a.m. no significant new complaints.  She continues to have some abdominal discomfort.  Objective: Vital signs in last 24 hours: Temp:  [98.2 F (36.8 C)-98.8 F (37.1 C)] 98.4 F (36.9 C) (09/30 0535) Pulse Rate:  [80-99] 99 (09/30 0535) Resp:  [14] 14 (09/30 0535) BP: (124-139)/(83-85) 139/83 (09/30 0535) SpO2:  [93 %-100 %] 93 % (09/30 0535) Last BM Date: 04/11/20 1887 IV Urine x10 Stool x2 Afebrile vital signs are stable Labs are stable this AM.  Intake/Output from previous day: 09/29 0701 - 09/30 0700 In: 1887.9 [I.V.:1583.9; IV Piggyback:304] Out: -  Intake/Output this shift: No intake/output data recorded.  General appearance: alert, cooperative and no distress GI: Soft she is not tender to palpation but does complain of some lower abdominal pain still.  Positive bowel sounds.  No distention.  Lab Results:  Recent Labs    04/11/20 0757 04/13/20 0359  WBC 14.9* 8.1  HGB 11.1* 11.2*  HCT 37.0 36.4  PLT 202 203    BMET Recent Labs    04/12/20 0808 04/13/20 0359  NA 144 140  K 3.6 4.9  CL 94* 94*  CO2 44* 40*  GLUCOSE 112* 101*  BUN 11 13  CREATININE 0.34* 0.34*  CALCIUM 8.9 9.2   PT/INR No results for input(s): LABPROT, INR in the last 72 hours.  Recent Labs  Lab 04/09/20 0637 04/10/20 1005 04/11/20 0757 04/12/20 0808 04/13/20 0359  AST 23 20 21  51* 38  ALT 12 12 13  33 46*  ALKPHOS 43 42 41 40 45  BILITOT 0.7 0.1* 0.1* 0.4 0.3  PROT 5.3* 5.5* 5.9* 5.3* 5.7*  ALBUMIN 3.1* 3.1* 3.4* 3.0* 3.5     Lipase     Component Value Date/Time   LIPASE 21 10/01/2019 1644     Medications: . Chlorhexidine Gluconate Cloth  6 each Topical Daily  . fluticasone furoate-vilanterol  1 puff Inhalation Daily  . heparin  5,000 Units Subcutaneous Q8H  . insulin aspart  0-9 Units Subcutaneous Q4H  . pantoprazole (PROTONIX) IV  40 mg Intravenous Q12H  . sodium chloride  flush  10-40 mL Intracatheter Q12H  . sucralfate  1 g Per Tube TID WC & HS  . 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 They could not determine whether the neuroendocrine tumor was metastatic.No pancreatic tissue mentioned on path report. 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 ?SMA compression. Contrast advances into the 3rd and 4th portions with delay, in the supine position.  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 -Followed by [facility] with multiple esophageal dilatations for chronic esophageal stricture;DilatedFriday 9/24 -Dr. Bryan Lemma Severe malnutrition -BMI 13.67/prealbumin 6.3>10.2 (9/28)  - for IR gastrostomy tube placement today.  FEN: NPO, TPN ID: None DVT: SCDs, SQ heparin  Follow-up: TBD   Plan: For IR gastrostomy with hopeful conversion to gastrostomy/jejunostomy.  We will continue to follow with you.  LOS: 6 days    Addy Mcmannis 04/13/2020 Please see Amion

## 2020-04-13 NOTE — Progress Notes (Signed)
PT Cancellation Note  Patient Details Name: [name] MRN: [id number] DOB: 1956/06/17   Cancelled Treatment:    Reason Eval/Treat Not Completed: Patient at procedure or test/unavailable (Patient off floor at IR for PEG placement. Will follow up at later date/time as schedule allows.)   Gwynneth Albright PT, DPT Acute Rehabilitation Services  Office [phone] Pager [phone]  04/13/2020 2:37 PM

## 2020-04-13 NOTE — Progress Notes (Signed)
PROGRESS NOTE    Indria Bishara [place]   LPF:[id number]  DOB: May 16, 1956  DOA: 04/06/2020     6  PCP: [facility]  CC: abd pain  Hospital Course: Ms. Gaw is a 64 year old female with PMH of COPD, chronic hypoxic respiratory failure on 2 L/min home oxygen, HTN, reported prior history of gastrinoma s/p surgery in [place] in 2014-2015, lung cancer s/p left upper lobectomy 2014-2015, sepsis related acute necrotic esophagus February 2021, since then multiple esophageal strictures and has undergone several EGDs with dilatation as outpatient and most recent procedure was on 03/13/2020 when she had multiple benign strictures in the mid to distal esophagus that were dilated, presented to the ED with 3 to 4 days history of diffuse abdominal and lower chest discomfort, nausea, vomiting, inability to keep down oral intake and diarrhea.    General surgery and [facility] consulting.  S/p EGD, esophageal stricture dilatation, placement of NG tube during endoscopy on 9/24.  PICC line placed 9/26, TNA started 9/27. NGT came out overnight of 04/10/20 accidentally and patient was too nervous for replacement but denied any N/V nor abd pain.  After further discussion with GI and surgery, patient will undergo placement of percutaneous gastrostomy tube with eventual conversion to Stem tube.  She also underwent evaluation by SLP and had no obvious oral issues or overt aspiration.  She is of course at increased risk for aspiration due to her longstanding issues with esophageal dilations and strictures.  She was considered appropriate for initiating dysphagia level 2 diet with thin liquids if and when appropriate once procedures are done.  She underwent successful placement of G-tube on 04/13/2020 with interventional radiology.  Interval History:  No events overnight.  Daughter also bedside this morning and updates given with questions answered.  She is tentatively planning on undergoing gastrostomy tube placement  this morning.  This did successfully take place and nutrition consult was ordered to initiate tube feeds this evening versus tomorrow morning. TPN will be weaned off today as well.  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  Initial CT abdomen showed distal esophageal wall thickening, dilated stomach and dilated proximal duodenum to the level of the SMA.  - S/p EGD 9/24, esophageal stricture dilatation, placement of NG tube, showed multiple duodenal ulcers which were biopsied and showed endoscopic appearance consistent with extraluminal compression by the SMA.  Treatment of SMA syndrome would be to improve calorie intake and restore fat pad in between SMA and duodenum.   - GI and General surgery input appreciated.  No surgical interventions at this time.  Continue supportive treatment (NGT displaced on 9/27 but no N/V currently) - Goal is to improve nutrition to restore fat pad in between SMA and duodenum.   - now is s/p IR placement of PEG on 9/30 with eventual GJ tube conversion - d/c TPN - RD consult to start enteral TF via PEG (can use as early as 8pm on 9/30)  Chronic dysphagia/recurrent esophageal strictures: Recurrent benign esophageal strictures due to previous acute necrotic esophagus and Candida esophagitis.  Requiring multiple EGDs and dilatations.  Had EGD and dilatation of strictures again on 9/24. - SLP eval on 9/29; okay for dysphagia level 2 thin liquids; continue aspiration precautions too - can start this trial  of orals possibly on 10/1  Pruritus and tongue swelling: Reported by patient, family and patient's RN on 9/26.  Reportedly having diffuse body itching.?  Tongue swelling without  thrush.  Unclear etiology.  Unsure if this is an allergic reaction.  No new medications started - overall has improved and no further swelling appreciated - d/c benadryl, pepcid, and solu-medrol and monitor   Anemia: Hemoglobin is dropped from 13.1-10.4 in the absence of overt bleeding.?  Dilutional.  Follow CBC.  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  COPD/chronic respiratory failure with hypoxia on home oxygen 2 L/min: No clinical bronchospasm.  Essential hypertension: Controlled today.  Hypophosphatemia: Replace in TNA.  Hypoglycemia- resolved  - transient; now s/p D5 - d/c IVF and monitor CBG; change to q4h monitoring   Body mass index is 14.12 kg/m.  Nutritional Status Nutrition Problem: Severe Malnutrition Etiology: chronic illness (COPD, recurrent esophageal strictures) Signs/Symptoms: percent weight loss, energy intake < or equal to 75% for > or equal to 1 month, severe fat depletion, severe muscle depletion Percent weight loss: 22 % (x 9 months) Interventions: Refer to RD note for recommendations  Antimicrobials: none  DVT prophylaxis: HSQ Code Status: Full Family Communication: none Disposition Plan: Status is: Inpatient  Remains inpatient appropriate because:IV treatments appropriate due to intensity of illness or inability to take PO and Inpatient level of care appropriate due to severity of illness   Dispo: The patient is from: Home              Anticipated d/c is to: Home              Anticipated d/c date is: 2 days              Patient currently is not medically stable to d/c.  Objective: Blood pressure 114/72, pulse 92, temperature 98.1 F (36.7 C), temperature source Oral, resp. rate 13, height 4\' 11"  (1.499 m), weight 31.7 kg, SpO2 100 %.  Examination: General appearance: frail and thin woman with hoarse voice laying in bed in NAD Head: Normocephalic, without obvious abnormality,  atraumatic Eyes: EOMI Lungs: clear to auscultation bilaterally Heart: regular rate and rhythm and S1, S2 normal Abdomen: TTP in epigastrium, no R/G; BS present Extremities: thin, no edema Skin: mobility and turgor normal Neurologic: weak but no focal deficits   Consultants:   GI  Surgery  IR  Procedures:  EGD 9/24 NG tube placed during EGD 9/24 PICC line right upper extremity 9/26 IR placed PEG on 9/30  Data Reviewed: I have personally reviewed following labs and imaging studies Results for orders placed or performed during the hospital encounter of 04/06/20 (from the past 24 hour(s))  Glucose, capillary     Status: Abnormal   Collection Time: 04/12/20  8:33 PM  Result Value Ref Range   Glucose-Capillary 126 (H) 70 - 99 mg/dL  Glucose, capillary     Status: Abnormal   Collection Time: 04/13/20 12:28 AM  Result Value Ref Range   Glucose-Capillary 117 (H) 70 - 99 mg/dL  Glucose, capillary     Status: Abnormal   Collection Time: 04/13/20  3:57 AM  Result Value Ref Range   Glucose-Capillary 116 (H) 70 - 99 mg/dL  Comprehensive metabolic panel     Status: Abnormal   Collection Time: 04/13/20  3:59 AM  Result Value Ref Range   Sodium 140 135 - 145 mmol/L   Potassium 4.9 3.5 - 5.1 mmol/L  Chloride 94 (L) 98 - 111 mmol/L   CO2 40 (H) 22 - 32 mmol/L   Glucose, Bld 101 (H) 70 - 99 mg/dL   BUN 13 8 - 23 mg/dL   Creatinine, Ser 0.34 (L) 0.44 - 1.00 mg/dL   Calcium 9.2 8.9 - 10.3 mg/dL   Total Protein 5.7 (L) 6.5 - 8.1 g/dL   Albumin 3.5 3.5 - 5.0 g/dL   AST 38 15 - 41 U/L   ALT 46 (H) 0 - 44 U/L   Alkaline Phosphatase 45 38 - 126 U/L   Total Bilirubin 0.3 0.3 - 1.2 mg/dL   GFR calc non Af Amer >60 >60 mL/min   GFR calc Af Amer >60 >60 mL/min   Anion gap 6 5 - 15  Magnesium     Status: None   Collection Time: 04/13/20  3:59 AM  Result Value Ref Range   Magnesium 2.0 1.7 - 2.4 mg/dL  Phosphorus     Status: None   Collection Time: 04/13/20  3:59 AM  Result Value Ref  Range   Phosphorus 4.0 2.5 - 4.6 mg/dL  CBC with Differential/Platelet     Status: Abnormal   Collection Time: 04/13/20  3:59 AM  Result Value Ref Range   WBC 8.1 4.0 - 10.5 K/uL   RBC 4.25 3.87 - 5.11 MIL/uL   Hemoglobin 11.2 (L) 12.0 - 15.0 g/dL   HCT 36.4 36 - 46 %   MCV 85.6 80.0 - 100.0 fL   MCH 26.4 26.0 - 34.0 pg   MCHC 30.8 30.0 - 36.0 g/dL   RDW 16.4 (H) 11.5 - 15.5 %   Platelets 203 150 - 400 K/uL   nRBC 0.0 0.0 - 0.2 %   Neutrophils Relative % 48 %   Neutro Abs 3.9 1.7 - 7.7 K/uL   Lymphocytes Relative 37 %   Lymphs Abs 3.0 0.7 - 4.0 K/uL   Monocytes Relative 11 %   Monocytes Absolute 0.9 0 - 1 K/uL   Eosinophils Relative 4 %   Eosinophils Absolute 0.3 0 - 0 K/uL   Basophils Relative 0 %   Basophils Absolute 0.0 0 - 0 K/uL   Immature Granulocytes 0 %   Abs Immature Granulocytes 0.02 0.00 - 0.07 K/uL  Glucose, capillary     Status: Abnormal   Collection Time: 04/13/20  8:46 AM  Result Value Ref Range   Glucose-Capillary 121 (H) 70 - 99 mg/dL  Glucose, capillary     Status: None   Collection Time: 04/13/20 12:14 PM  Result Value Ref Range   Glucose-Capillary 80 70 - 99 mg/dL    Recent Results (from the past 240 hour(s))  Blood culture (routine single)     Status: None   Collection Time: 04/06/20  9:30 PM   Specimen: BLOOD  Result Value Ref Range Status   Specimen Description   Final    BLOOD LEFT ANTECUBITAL Performed at [facility], [address] [place], [place] [address]    Special Requests   Final    BOTTLES DRAWN AEROBIC AND ANAEROBIC Blood Culture results may not be optimal due to an excessive volume of blood received in culture bottles Performed at [place] [address] [place], [place] [address]    Culture   Final    NO GROWTH 5 DAYS Performed at [facility], [place] [address], [place], [place] [address]    Report Status 04/12/2020 FINAL  Final  Urine culture  Status: None   Collection  Time: 04/06/20  9:30 PM   Specimen: In/Out Cath Urine  Result Value Ref Range Status   Specimen Description   Final    IN/OUT CATH URINE Performed at [facility], [place] [address] [place], [place] [address]    Special Requests   Final    NONE Performed at [facility], [place] [address], [place], [place] [address]    Culture   Final    NO GROWTH Performed at [facility], [place] [address], [place], [place] [address]    Report Status 04/08/2020 FINAL  Final  Respiratory Panel by RT PCR (Flu A&B, Covid) - Nasopharyngeal Swab     Status: None   Collection Time: 04/06/20 10:26 PM   Specimen: Nasopharyngeal Swab  Result Value Ref Range Status   SARS Coronavirus 2 by RT PCR NEGATIVE NEGATIVE Final    Comment: (NOTE) SARS-CoV-2 target nucleic acids are NOT DETECTED.  The SARS-CoV-2 RNA is generally detectable in upper respiratoy specimens during the acute phase of infection. The lowest concentration of SARS-CoV-2 viral copies this assay can detect is 131 copies/mL. A negative result does not preclude SARS-Cov-2 infection and should not be used as the sole basis for treatment or other patient management decisions. A negative result may occur with  improper specimen collection/handling, submission of specimen other than nasopharyngeal swab, presence of viral mutation(s) within the areas targeted by this assay, and inadequate number of viral copies (<131 copies/mL). A negative result must be combined with clinical observations, patient history, and epidemiological information. The expected result is Negative.  Fact Sheet for Patients:  PinkCheek.be  Fact Sheet for Healthcare Providers:  GravelBags.it  This test is no t yet approved or cleared by the [place] FDA and  has been authorized for detection and/or diagnosis of SARS-CoV-2 by FDA under an Emergency Use Authorization (EUA).  This EUA will remain  in effect (meaning this test can be used) for the duration of the COVID-19 declaration under Section 564(b)(1) of the Act, 21 U.S.C. section 360bbb-3(b)(1), unless the authorization is terminated or revoked sooner.     Influenza A by PCR NEGATIVE NEGATIVE Final   Influenza B by PCR NEGATIVE NEGATIVE Final    Comment: (NOTE) The Xpert Xpress SARS-CoV-2/FLU/RSV assay is intended as an aid in  the diagnosis of influenza from Nasopharyngeal swab specimens and  should not be used as a sole basis for treatment. Nasal washings and  aspirates are unacceptable for Xpert Xpress SARS-CoV-2/FLU/RSV  testing.  Fact Sheet for Patients: PinkCheek.be  Fact Sheet for Healthcare Providers: GravelBags.it  This test is not yet approved or cleared by the [place] FDA and  has been authorized for detection and/or diagnosis of SARS-CoV-2 by  FDA under an Emergency Use Authorization (EUA). This EUA will remain  in effect (meaning this test can be used) for the duration of the  Covid-19 declaration under Section 564(b)(1) of the Act, 21  U.S.C. section 360bbb-3(b)(1), unless the authorization is  terminated or revoked. Performed at [facility], [place] [address] [place], [place] [address]      Radiology Studies: No results found. DG UGI W SINGLE CM (SOL OR THIN BA)  Final Result    [place] EKG SITE RITE  Final Result    DG Abd 1 View - KUB  Final Result    CT ABDOMEN PELVIS W CONTRAST  Final Result    DG Chest Port 1 View  Final  Result    IR GASTROSTOMY TUBE MOD SED    (Results Pending)    Scheduled Meds: . Chlorhexidine Gluconate Cloth  6 each Topical Daily  . fentaNYL      . fluticasone furoate-vilanterol  1 puff Inhalation Daily  . heparin  5,000 Units Subcutaneous Q8H  . insulin aspart  0-9 Units Subcutaneous Q4H  . lidocaine-EPINEPHrine      . pantoprazole (PROTONIX) IV  40 mg  Intravenous Q12H  . sodium chloride flush  10-40 mL Intracatheter Q12H  . sucralfate  1 g Per Tube TID WC & HS  . umeclidinium bromide  1 puff Inhalation Daily   PRN Meds: albuterol, diphenhydrAMINE, fentaNYL, lidocaine-EPINEPHrine, midazolam, morphine injection, ondansetron **OR** ondansetron (ZOFRAN) IV, phenol, sodium chloride flush Continuous Infusions: . TPN ADULT (ION) 30 mL/hr at 04/13/20 1233      LOS: 6 days  Time spent: Greater than 50% of the 35 minute visit was spent in counseling/coordination of care for the patient as laid out in the A&P.   Dwyane Dee, MD [facility] 04/13/2020, 4:16 PM  Contact via secure chat.  To contact the attending provider between 7A-7P or the covering provider during after hours 7P-7A, please log into the web site [email or web address] and access using universal [place] password for that web site. If you do not have the password, please call the hospital operator.

## 2020-04-13 NOTE — Procedures (Signed)
Interventional Radiology Procedure Note  Procedure: Fluoroscopic guided gastrostomy tube placement  Findings:  Successful placement of 24 Fr balloon retention gastrostomy.    Complications: None  Recommendations: -may use gastrostomy for medications, feeds in 4 hours (20:00) -absorbable t-tack sutures are in place, these do not require suture removal  -please notify IR with any problems with tube -IR will follow   Ruthann Cancer, MD Pager: [phone]

## 2020-04-13 NOTE — Progress Notes (Addendum)
SLP Cancellation Note  Patient Details Name: Claudia Wiley MRN: [id number] DOB: May 17, 1956   Cancelled treatment:       Reason Eval/Treat Not Completed: Other (comment) (pt npo for PEG with IR, after PEG when fully alert, would advise to advance diet to ONLY clears as GI had recommended yesterday. Thanks. )  Kathleen Lime, MS [facility] SLP Acute Rehab Services Office [phone]  Macario Golds 04/13/2020, 11:40 AM

## 2020-04-14 DIAGNOSIS — L299 Pruritus, unspecified: Secondary | ICD-10-CM

## 2020-04-14 DIAGNOSIS — R1084 Generalized abdominal pain: Secondary | ICD-10-CM

## 2020-04-14 DIAGNOSIS — D649 Anemia, unspecified: Secondary | ICD-10-CM

## 2020-04-14 LAB — GLUCOSE, CAPILLARY
Glucose-Capillary: 98 mg/dL (ref 70–99)
Glucose-Capillary: 103 mg/dL — ABNORMAL HIGH (ref 70–99)
Glucose-Capillary: 100 mg/dL — ABNORMAL HIGH (ref 70–99)
Glucose-Capillary: 95 mg/dL (ref 70–99)
Glucose-Capillary: 140 mg/dL — ABNORMAL HIGH (ref 70–99)

## 2020-04-14 LAB — BASIC METABOLIC PANEL
Anion gap: 7 (ref 5–15)
BUN: 10 mg/dL (ref 8–23)
CO2: 38 mmol/L — ABNORMAL HIGH (ref 22–32)
Calcium: 8.9 mg/dL (ref 8.9–10.3)
Chloride: 91 mmol/L — ABNORMAL LOW (ref 98–111)
Creatinine, Ser: 0.37 mg/dL — ABNORMAL LOW (ref 0.44–1.00)
GFR calc Af Amer: >60 mL/min (ref >60)
GFR calc non Af Amer: >60 mL/min (ref >60)
Glucose, Bld: 80 mg/dL (ref 70–99)
Potassium: 4.6 mmol/L (ref 3.5–5.1)
Sodium: 136 mmol/L (ref 135–145)

## 2020-04-14 LAB — CBC WITH DIFFERENTIAL/PLATELET
Abs Immature Granulocytes: 0.02 10*3/uL (ref 0.00–0.07)
Basophils Absolute: 0 10*3/uL (ref 0.0–0.1)
Basophils Relative: 0 %
Eosinophils Absolute: 0.4 10*3/uL (ref 0.0–0.5)
Eosinophils Relative: 4 %
HCT: 37.1 % (ref 36.0–46.0)
Hemoglobin: 11.5 g/dL — ABNORMAL LOW (ref 12.0–15.0)
Immature Granulocytes: 0 %
Lymphocytes Relative: 37 %
Lymphs Abs: 3.3 10*3/uL (ref 0.7–4.0)
MCH: 26.3 pg (ref 26.0–34.0)
MCHC: 31 g/dL (ref 30.0–36.0)
MCV: 84.9 fL (ref 80.0–100.0)
Monocytes Absolute: 1 10*3/uL (ref 0.1–1.0)
Monocytes Relative: 11 %
Neutro Abs: 4.3 10*3/uL (ref 1.7–7.7)
Neutrophils Relative %: 48 %
Platelets: 220 10*3/uL (ref 150–400)
RBC: 4.37 MIL/uL (ref 3.87–5.11)
RDW: 16.5 % — ABNORMAL HIGH (ref 11.5–15.5)
WBC: 8.9 10*3/uL (ref 4.0–10.5)
nRBC: 0 % (ref 0.0–0.2)

## 2020-04-14 LAB — MAGNESIUM: Magnesium: 1.9 mg/dL (ref 1.7–2.4)

## 2020-04-14 MED ORDER — PANTOPRAZOLE SODIUM 40 MG IV SOLR
40.0000 mg | Freq: Once | INTRAVENOUS | Status: AC
  Administered 2020-04-14: 40 mg via INTRAVENOUS
  Filled 2020-04-14: qty 40

## 2020-04-14 MED ORDER — PANTOPRAZOLE SODIUM 40 MG PO TBEC
40.0000 mg | DELAYED_RELEASE_TABLET | Freq: Two times a day (BID) | ORAL | Status: DC
  Administered 2020-04-14 – 2020-04-15 (×2): 40 mg via ORAL
  Filled 2020-04-14 (×2): qty 1

## 2020-04-14 MED ORDER — OSMOLITE 1.2 CAL PO LIQD
1000.0000 mL | ORAL | Status: DC
  Administered 2020-04-14 – 2020-04-19 (×4): 1000 mL

## 2020-04-14 MED ORDER — MORPHINE SULFATE (PF) 2 MG/ML IV SOLN
2.0000 mg | INTRAVENOUS | Status: DC | PRN
  Administered 2020-04-14 – 2020-04-15 (×10): 2 mg via INTRAVENOUS
  Filled 2020-04-14 (×10): qty 1

## 2020-04-14 NOTE — Assessment & Plan Note (Signed)
-   see esophageal stricture - CLD for now

## 2020-04-14 NOTE — Assessment & Plan Note (Signed)
-   continue TF - see esophageal stricture

## 2020-04-14 NOTE — Assessment & Plan Note (Signed)
-   see esophageal stricture

## 2020-04-14 NOTE — Progress Notes (Signed)
Nutrition Follow-up  DOCUMENTATION CODES:   Underweight, Severe malnutrition in context of chronic illness  INTERVENTION:   -Initiate Osmolite 1.2 @ 20 ml/hr via G-tube, advance by 10 ml every 12 hours to goal rate of 50 ml/hr. -This regimen at goal rate provides 1440 kcals, 66g protein and 984 ml H2O.   NUTRITION DIAGNOSIS:   Severe Malnutrition related to chronic illness (COPD, recurrent esophageal strictures) as evidenced by percent weight loss, energy intake < or equal to 75% for > or equal to 1 month, severe fat depletion, severe muscle depletion.  Ongoing.  GOAL:   Patient will meet greater than or equal to 90% of their needs  Progressing now with TF  MONITOR:   Labs, Weight trends, I & O's  REASON FOR ASSESSMENT:   Consult Enteral/tube feeding initiation and management  ASSESSMENT:   64 y.o. female, known to Dr. Fuller Plan, who was readmitted last evening after presenting to the emergency room with 3 to 4-day history of nausea and vomiting inability to keep down p.o.'s or meds.  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  She was told this was a Whipple procedure but no evidence of Whipple by more recent EGDs.  She also has history of lung cancer for which she underwent a left upper lobectomy in 2014 2015 also in [place].  9/24: s/p EGD, enteroscopy - esophageal strictures, multiple duodenal ulcerations, hiatal hernia, NGT placed set to LIS 9/27: PICC placed, TPN initiated 9/28: NGT removed 9/30: G-tube placement via IR, TPN weaned off 10/1: CLD  Patient in room with IR at time of visit.  RD consulted to begin TF via G-tube. Will start slow advancement.   Admission weight: 67 lbs. Weight on 9/27: 69 lbs Daily weights  ordered via TF protocol.  Medications: Carafate  Labs reviewed: CBGs: 98-103  Diet Order:   Diet Order            Diet clear liquid Room service appropriate? Yes; Fluid consistency: Thin  Diet effective now                 EDUCATION NEEDS:   Not appropriate for education at this time  Skin:  Skin Assessment: Reviewed RN Assessment  Last BM:  9/29  Height:   Ht Readings from Last 1 Encounters:  04/07/20 4\' 11"  (1.499 m)    Weight:   Wt Readings from Last 1 Encounters:  04/10/20 31.7 kg   BMI:  Body mass index is 14.12 kg/m.  Estimated Nutritional Needs:   Kcal:  1250-1450  Protein:  60-75g  Fluid:  1.5L/day  Clayton Bibles, MS, RD, LDN Inpatient Clinical Dietitian Contact information available via Amion

## 2020-04-14 NOTE — Assessment & Plan Note (Signed)
-   controlled - continue current regimen

## 2020-04-14 NOTE — Assessment & Plan Note (Addendum)
Recurrent benign esophageal strictures due to previous acute necrotic esophagus and Candida esophagitis. Requiring multiple EGDs and dilatations. Had EGD and dilatation of strictures again on 9/24. - SLP eval on 9/29; technically okay for dysphagia level 2 thin liquids; continue aspiration precautions  - continue liquid diet orally for now and at discharge; long term management with GI and possible advancement in the future

## 2020-04-14 NOTE — Assessment & Plan Note (Signed)
Reported by patient, family and patient's RNon 9/26. Reportedly having diffuse bodyitching.? Tongue swelling without thrush. Unclear etiology. Unsure if this is an allergic reaction. No new medications started - overall has improved and no further swelling appreciated - d/c benadryl, pepcid, and solu-medrol and monitor

## 2020-04-14 NOTE — Assessment & Plan Note (Addendum)
-   intermittent CBC

## 2020-04-14 NOTE — Progress Notes (Signed)
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  General surgery and [facility] consulting.  S/p EGD, esophageal stricture dilatation, placement of NG tube during endoscopy on 9/24.  PICC line placed 9/26, TNA to start 9/27.  S/p UGI series 9/27.    PT Comments    Patient received in bed and agreeable to participate in therapy but limited by pain. She required assist for line management and anterior scooting in bed. Pt able to rise with min guard for safety and take several small steps to [facility]. Pt agreeable to repeated sit<>stands for LE exercises and required min assist to raise LE's into bed at EOS. RN provided pain medication during session. She will continue to benefit from skilled PT interventions, recommending f/u HHPT when medically ready to discharge home. Acute will progress as able.   Follow Up Recommendations  Home health PT     Equipment Recommendations  None recommended by PT    Recommendations for Other Services       Precautions / Restrictions Precautions Precautions: Fall Restrictions Weight Bearing Restrictions: No    Mobility  Bed Mobility Overal bed mobility: Needs Assistance Bed Mobility: Supine  to Sit;Sit to Supine     Supine to sit: Min assist;HOB elevated Sit to supine: Min assist   General bed mobility comments: assist for line management and to scoot forward to place feet on floor. assist for LE's to raise back into bed.   Transfers Overall transfer level: Needs assistance Equipment used: Rolling walker (2 wheeled);None Transfers: Sit to/from [employer] to Stand: Min guard Stand pivot transfers: Min guard       General transfer comment: clsoe guard for safety and assist for line management. pt able to rise and complete small steps to move bed<>BSC. pt agreeable to repeated sit<>stands for LE strengthening.   Ambulation/Gait                 Stairs             Wheelchair Mobility    Modified Rankin (Stroke Patients Only)       Balance Overall balance assessment: Needs assistance;Mild deficits observed, not formally tested Sitting-balance support: Feet supported Sitting balance-Leahy Scale: Fair     Standing balance support: During functional activity Standing balance-Leahy Scale: Fair Standing balance comment: pt holding bed rail and reaching for [facility] while completing stand step transfer.                             Cognition Arousal/Alertness: Awake/alert Behavior During Therapy: WFL for tasks assessed/performed Overall Cognitive Status: Within Functional Limits for tasks assessed  Exercises Other Exercises Other Exercises: 1x5 reps sit<>stand for EOB with RW to steady.    General Comments        Pertinent Vitals/Pain Pain Assessment: Faces Faces Pain Scale: Hurts even more Pain Location: stomach after PEG placement Pain Descriptors / Indicators: Grimacing;Guarding Pain Intervention(s): Limited activity within patient's tolerance;Monitored during session;Repositioned;Patient requesting pain meds-RN notified;RN gave pain meds during session     Home Living                      Prior Function            PT Goals (current goals can now be found in the care plan section) Acute Rehab PT Goals Patient Stated Goal: to go back home PT Goal Formulation: With patient Time For Goal Achievement: 04/24/20 Potential to Achieve Goals: Good Progress towards PT goals: Progressing toward goals    Frequency    Min 3X/week      PT Plan Current plan remains appropriate;Discharge plan needs to be updated    Co-evaluation              AM-PAC PT "6 Clicks" Mobility   Outcome Measure  Help needed turning from your back to your side while in a flat bed without using bedrails?: A Little Help needed moving from lying on your back to sitting on the side of a flat bed without using bedrails?: A Little Help needed moving to and from a bed to a chair (including a wheelchair)?: A Little Help needed standing up from a chair using your arms (e.g., wheelchair or bedside chair)?: A Little Help needed to walk in hospital room?: A Little Help needed climbing 3-5 steps with a railing? : A Lot 6 Click Score: 17    End of Session   Activity Tolerance: Patient limited by pain;Patient tolerated treatment well Patient left: in bed;with call bell/phone within reach;with nursing/sitter in room Nurse Communication: Mobility status;Patient requests pain meds PT Visit Diagnosis: Other abnormalities of gait and mobility (R26.89);Muscle weakness (generalized) (M62.81)     Time: 9233-0076 PT Time Calculation (min) (ACUTE ONLY): 27 min  Charges:  $Therapeutic Exercise: 8-22 mins $Therapeutic Activity: 8-22 mins                    Verner Mould, DPT Acute Rehabilitation Services  Office [phone] Pager [phone]  04/14/2020 2:57 PM

## 2020-04-14 NOTE — Plan of Care (Signed)
  Problem: Education: Goal: Knowledge of General Education information will improve Description Including pain rating scale, medication(s)/side effects and non-pharmacologic comfort measures Outcome: Progressing   Problem: Health Behavior/Discharge Planning: Goal: Ability to manage health-related needs will improve Outcome: Progressing   

## 2020-04-14 NOTE — Assessment & Plan Note (Addendum)
Reportedly started approximately 48 hours after initial Covid vaccine. Initial CT abdomen showed distal esophageal wall thickening, dilated stomach and dilated proximal duodenum to the level of the SMA.  - S/p EGD 9/24, esophageal stricture dilatation, placement of NG tube, showed multiple duodenal ulcers which were biopsied and showed endoscopic appearance consistent with extraluminal compression by the SMA. Treatment of SMA syndrome would be to improve calorie intake and restore fat pad in between SMA and duodenum.  - GI and General surgery input appreciated. No surgical interventions at this time. Continue supportive treatment (NGT displaced on 9/27 but no N/V currently) - Goal is to improve nutrition to restore fat pad in between SMA and duodenum.  - s/p IR placement of PEG on 9/30 with eventual GJ tube conversion if needed/not tolerating G-tube - d/c TPN (stopped 9/30) - continue on enteral TF; appreciate RD consult; per RD patient will need pump to continue continuous TF at discharge  - patient now having more nausea and not tolerating TF; surgery has requested tube converted to [place]; underwent G to [place] tube conversion on 10/5; hopeful that this will solve her Nausea and enable her to tolerate TF going forward - monitor overnight; patient should be medically stable and ready for d/c on 10/6; only other barrier is that daughter is still waiting on TF formula to be available as she says the company was "out"; pump and tubing are obtained and daughter will be educated further on pump use today by RN bedside

## 2020-04-14 NOTE — Progress Notes (Signed)
Referring Physician(s): Martin,M  Supervising Physician: Jacqulynn Cadet  Patient Status:  [facility] - In-pt  Chief Complaint:  Dysphagia, malnutrition  Subjective: Pt sore at G tube site as expected; no N/V; no leaking at G tube site;  otherwise stable; tube feeds are running  Allergies: Azithromycin, Boniva [ibandronic acid], and Gabapentin  Medications: Prior to Admission medications   Medication Sig Start Date End Date Taking? Authorizing Provider  albuterol (PROVENTIL HFA;VENTOLIN HFA) 108 (90 Base) MCG/ACT inhaler Inhale 2 puffs into the lungs every 6 (six) hours as needed for wheezing or shortness of breath.    Yes [provider]  albuterol (PROVENTIL) (2.5 MG/3ML) 0.083% nebulizer solution Take 2.5 mg by nebulization every 6 (six) hours as needed for wheezing or shortness of breath.   Yes [provider]  budesonide-formoterol (SYMBICORT) 160-4.5 MCG/ACT inhaler Inhale 1 puff into the lungs daily. Patient taking differently: Inhale 1 puff into the lungs in the morning and at bedtime.  12/21/19   [name], MD  Calcium Carbonate Antacid (CALCIUM CARBONATE, DOSED IN MG ELEMENTAL CALCIUM,) 1250 MG/5ML SUSP Take 1,250 mg of elemental calcium by mouth daily.    [provider]  Cholecalciferol (VITAMIN D3) LIQD Take 400 Units by mouth daily.    [provider]  ENSURE (ENSURE) Take 237 mLs by mouth 3 (three) times daily between meals.     [provider]  fluticasone (FLONASE) 50 MCG/ACT nasal spray Place 1 spray into both nostrils daily as needed for allergies or rhinitis.    [provider]  mirtazapine (REMERON) 15 MG tablet Take 15 mg by mouth at bedtime. *Crushes Tablet*    [provider]  omeprazole (PRILOSEC) 40 MG capsule Take 40 mg by mouth in the morning and at bedtime.    [provider]  pantoprazole (PROTONIX) 40 MG tablet Take 1 tablet (40 mg total) by mouth 2 (two) times daily before a  meal. Patient not taking: Reported on 01/26/2020 11/29/19   [name], MD  pregabalin (LYRICA) 150 MG capsule Take 150 mg by mouth at bedtime. Open capsule    [provider]  sucralfate (CARAFATE) 1 GM/10ML suspension Take 1 g by mouth in the morning, at noon, in the evening, and at bedtime.    [provider]  Tiotropium Bromide Monohydrate 2.5 MCG/ACT AERS Inhale 2 puffs into the lungs daily.     [provider]     Vital Signs: BP 120/75 (BP Location: Left Arm)   Pulse 87   Temp 98.3 F (36.8 C) (Oral)   Resp 18   Ht 4\' 11"  (1.499 m)   Wt 69 lb 14.2 oz (31.7 kg)   SpO2 99%   BMI 14.12 kg/m   Physical Exam awake/alert; G tube site ok, t tacks in place; no leakage or bleeding noted; abd soft,ND, mild- mod tender at insertion site  Imaging: IR GASTROSTOMY TUBE MOD SED  Result Date: 04/13/2020 INDICATION: 64 year old female with history of multiple esophageal strictures requiring percutaneous enteric access for nutritional supplementation. Plan for eventual J arm placement. EXAM: GASTROSTOMY TUBE PLACEMENT COMPARISON:  CT abdomen pelvis from 04/06/2020 MEDICATIONS: Ancef 2 gm IV; Antibiotics were administered within 1 hour of the procedure. CONTRAST:  Twenty mL of Omnipaque 300 administered into the gastric lumen. ANESTHESIA/SEDATION: Moderate (conscious) sedation was employed during this procedure. A total of Versed 2 mg and Fentanyl 100 mcg was administered intravenously. Moderate Sedation Time: 32 minutes. The patient's level of consciousness and vital  signs were monitored continuously by radiology nursing throughout the procedure under my direct supervision. FLUOROSCOPY TIME:  Three minutes 18 seconds (6 mGy) COMPLICATIONS: None immediate. PROCEDURE: Informed written consent was obtained from the patient following explanation of the procedure, risks, benefits and alternatives. A time out was performed prior to the initiation of the procedure. Ultrasound  scanning was performed to demarcate the edge of the left lobe of the liver. Maximal barrier sterile technique utilized including caps, mask, sterile gowns, sterile gloves, large sterile drape, hand hygiene and Betadine prep. Review of pre-procedure CT scan demonstrates an adequate window for percutaneous placement of a gastrostomy tube. The patient was placed on the procedure table in the supine position. Pre-procedure abdominal film confirmed visualization of the transverse colon. An angled 5-French catheter was passed through the nares into the stomach. The patient was prepped and draped in usual sterile fashion. The stomach was insufflated with air via the indwelling nasogastric tube. Under fluoroscopy, a puncture site was selected and local analgesia achieved with 1% lidocaine infiltrated subcutaneously. Under fluoroscopic guidance, a gastropexy needle was passed into the stomach and the T-bar suture was released. Entry into the stomach was confirmed with fluoroscopy, aspiration of air, and injection of contrast material. This was repeated with an additional gastropexy suture (for a total of 2 fasteners). At the center of these gastropexy sutures, a dermatotomy was performed. An 18 gauge needle was passed into the stomach at the site of this dermatotomy, and position within the gastric lumen again confirmed under fluoroscopy using aspiration of air and contrast injection. An Amplatz guidewire was passed through this needle and intraluminal placement within the stomach was confirmed by fluoroscopy. The needle was removed. Over the guidewire, the percutaneous tract was dilated using a 10 mm x 4 cm semi-compliant angioplasty balloon, which was quickly deflated followed by passage of a 24 French percutaneous gastrostomy tube. The retention balloon of the percutaneous gastrostomy tube was inflated with 20 mL of sterile water with a small amount of contrast material. The tube was withdrawn until the retention balloon  was at the edge of the gastric lumen. The external bumper was brought to the abdominal wall. Contrast was injected through the gastrostomy tube, confirming intraluminal positioning. The patient tolerated the procedure well without any immediate post-procedural complications. FINDINGS: After successful fluoroscopic guided placement, the gastrostomy tube is appropriately positioned with internal retention balloon against the ventral aspect of the gastric lumen. IMPRESSION: Successful fluoroscopic insertion of a 24 French balloon retention gastrostomy tube. Ruthann Cancer, MD [facility] Radiology Electronically Signed   By: Ruthann Cancer MD   On: 04/13/2020 17:12    Labs:  CBC: Recent Labs    04/10/20 1520 04/11/20 0757 04/13/20 0359 04/14/20 0602  WBC 5.9 14.9* 8.1 8.9  HGB 10.0* 11.1* 11.2* 11.5*  HCT 33.1* 37.0 36.4 37.1  PLT 170 202 203 220    COAGS: Recent Labs    04/06/20 2130  INR 1.1  APTT 33    BMP: Recent Labs    04/11/20 0757 04/12/20 0808 04/13/20 0359 04/14/20 0602  NA 139 144 140 136  K 4.5 3.6 4.9 4.6  CL 91* 94* 94* 91*  CO2 42* 44* 40* 38*  GLUCOSE 161* 112* 101* 80  BUN 7* 11 13 10   CALCIUM 9.0 8.9 9.2 8.9  CREATININE 0.42* 0.34* 0.34* 0.37*  GFRNONAA >60 >60 >60 >60  GFRAA >60 >60 >60 >60    LIVER FUNCTION TESTS: Recent Labs  04/10/20 1005 04/11/20 0757 04/12/20 0808 04/13/20 0359  BILITOT 0.1* 0.1* 0.4 0.3  AST 20 21 51* 38  ALT 12 13 33 46*  ALKPHOS 42 41 40 45  PROT 5.5* 5.9* 5.3* 5.7*  ALBUMIN 3.1* 3.4* 3.0* 3.5    Assessment and Plan: Patient with history of multiple esophageal strictures requiring percutaneous enteric access for nutritional supplementation, SMA syndrome; status post percutaneous gastrostomy tube on 9/30; afebrile, WBC normal, hemoglobin stable at 11.5, creatinine 0.37; G-tube site appears stable, okay to use, make sure outer disc is taut to skin surface to prevent  leakage; pain meds prn; if abd pain worsens for any reason obtain CT abd for further evaluation   Electronically Signed: D. Rowe Robert, PA-C 04/14/2020, 2:59 PM   I spent a total of 15 minutes at the the patient's bedside AND on the patient's hospital floor or unit, greater than 50% of which was counseling/coordinating care for percutaneous gastrostomy tube     Patient ID: Claudia Wiley, female   DOB: 01-Jun-1956, 64 y.o.   MRN: [id number]

## 2020-04-14 NOTE — TOC Initial Note (Addendum)
Transition of Care [facility]) - Initial/Assessment Note    Patient Details  Name: Claudia Wiley First MRN: [id number] Date of Birth: May 01, 1956  Transition of [facility]) CM/SW Contact:    Lynnell Catalan, RN Phone Number: 04/14/2020, 2:22 PM  Clinical Narrative:                 Pt active with [facility] for home health RN prior to admission. Pt to go home with new tube feeds. Per hospital Dietician pt most likely unable to do bolus tube feeds. This CM faxed orders for [facility], tube feeds and tube feeding pump to [facility] [phone] and spoke with Ralene Bathe CSW [phone] about referral. Carver Fila to follow up with pt PCP and [facility]. Daughter [name] contacted via phone. She was informed that [facility] would be getting in touch with her about delivery of DME. Likely this will not happen til Monday. TOC will continue to follow along.  Expected Discharge Plan: [place] Barriers to Discharge: Continued Medical Work up   Patient Goals and CMS Choice Patient states their goals for this hospitalization and ongoing recovery are:: to get home      Expected Discharge Plan and Services Expected Discharge Plan: [place]   Discharge Planning Services: CM Consult   Living arrangements for the past 2 months: Apartment                 DME Arranged: Tube feeding, Tube feeding pump DME Agency:  ([facility]) Date DME Agency Contacted: 04/14/20 Time DME Agency Contacted: 1300 Representative spoke with at DME Agency: Ralene Bathe at [place]: PT, RN [facility] Agency:  ([facility]) Date [place]: 04/14/20 Time [place]: 76 Representative spoke with at [place]: Ronalee Belts  Prior Living Arrangements/Services Living arrangements for the past 2 months: Apartment Lives with:: Adult Children Patient language and need for interpreter reviewed:: Yes Do you feel safe going back to the place where you live?: Yes       Need for Family Participation in Patient Care: Yes (Comment) Care giver support system in place?: Yes (comment) Current home services: Home RN Criminal Activity/Legal Involvement Pertinent to Current Situation/Hospitalization: No - Comment as needed  Activities of Daily Living Home Assistive Devices/Equipment: Wheelchair, Environmental consultant (specify type) ADL Screening (condition at time of admission) Patient's cognitive ability adequate to safely complete daily activities?: Yes Is the patient deaf or have difficulty hearing?: No Does the patient have difficulty seeing, even when wearing glasses/contacts?: No Does the patient have difficulty concentrating, remembering, or making decisions?: No Patient able to express need for assistance with ADLs?: Yes Does the patient have difficulty dressing or bathing?: No Independently performs ADLs?: No Communication: Independent Dressing (OT): Independent Grooming: Independent Feeding: Independent Bathing: Independent Toileting: Needs assistance Is this a change from baseline?: Pre-admission baseline In/Out Bed: Needs assistance Is this a change from baseline?: Pre-admission baseline Walks in Home: Independent Does the patient have difficulty walking or climbing stairs?: Yes Weakness of Legs: Both Weakness of Arms/Hands: Both  Permission Sought/Granted Permission sought to share information with : Chartered certified accountant granted to share information with : Yes, Verbal Permission Granted     Permission granted to share info w AGENCY: [facility]        Emotional Assessment Appearance:: Appears older than stated age Attitude/Demeanor/Rapport: Engaged Affect (typically observed): Calm Orientation: : Oriented to Place, Oriented to Self, Oriented to  Time, Oriented to  Situation      Admission diagnosis:  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 Normocytic anemia 04/14/2020  . Duodenal stenosis   . SMAS (superior mesenteric artery syndrome) ([place]) 04/07/2020  . Gastric outlet obstruction   . Dysphagia   . Protein-calorie malnutrition, severe 10/06/2019  . Orthostasis 10/01/2019  . Candida esophagitis ([place]) 10/01/2019  . Odynophagia   . Leukocytosis   . Esophagitis   . Esophageal dysphagia   . Esophageal stricture   . Respiratory failure ([place]) 09/09/2019  . COPD (chronic obstructive pulmonary disease) ([place]) 06/30/2019  . Acute on chronic respiratory failure with hypoxia and hypercapnia ([place]) 06/29/2019  . GERD (gastroesophageal reflux disease)   . Pancreatic insufficiency   . Nausea & vomiting 04/03/2019  . Abdominal pain 04/03/2019  . Abnormal finding on urinalysis 04/03/2019  . Acute respiratory failure with hypoxia and hypercapnia ([place]) 04/02/2019  . COPD with acute exacerbation ([place]) 06/15/2018  . Essential hypertension 06/15/2018  . Genetic testing 06/01/2018  . Gastrinoma   . Family history of melanoma   . Family history of brain cancer   . Hypokalemia 10/19/2011  . Unspecified protein-calorie malnutrition ([place]) 10/19/2011  . Weight loss 10/19/2011  . Generalized weakness 10/19/2011  . Anodontia 10/19/2011   PCP:  Clinic, [place]:   [place], [address] [place] [address] Phone: [phone] Fax: [phone]  [place], [place] - [phone] [place] [address] Phone: [phone] Fax: [phone]     Social Determinants of Health (SDOH) Interventions    Readmission Risk Interventions Readmission Risk Prevention Plan 04/14/2020 10/05/2019  Transportation Screening Complete Complete  PCP or Specialist Appt within 3-5 Days Complete Complete  HRI or Home Care Consult Complete Not Complete   Social Work Consult for [place] Planning/Counseling Complete Not Complete  Palliative Care Screening Not Applicable Not Applicable  Medication Review Press photographer) Complete Complete  Some recent data might be hidden

## 2020-04-14 NOTE — Assessment & Plan Note (Addendum)
S/p surgery 2014/15 - f/u pending gastrin level = 90 (normal)

## 2020-04-14 NOTE — Progress Notes (Signed)
SLP DC  Note  Patient Details Name: Kerilyn Cortner MRN: [id number] DOB: 04/12/1956   Cancelled treatment:       Reason Eval/Treat Not Completed: Other (comment) (no SLP follow up as pt is being managed by GI for her esophageal dysphagia. Thanks. Please reconsult if desired.)  Kathleen Lime, MS [facility] SLP Acute Rehab Services Office [phone]  Macario Golds 04/14/2020, 11:45 AM

## 2020-04-14 NOTE — Progress Notes (Signed)
[facility] Progress Note    Since last GI note: G tube placed yesterday by IR, she has been pretty sore at the site since then  Objective: Vital signs in last 24 hours: Temp:  [98.1 F (36.7 C)-98.6 F (37 C)] 98.3 F (36.8 C) (10/01 0605) Pulse Rate:  [86-107] 87 (10/01 0605) Resp:  [9-23] 18 (10/01 0605) BP: (92-160)/(65-125) 120/75 (10/01 0605) SpO2:  [93 %-100 %] 99 % (10/01 0731) Last BM Date: 04/12/20 General: alert and oriented times 3 Heart: regular rate and rythm Abdomen: soft, moderately tender, G tube site looks good however, non-distended, normal bowel sounds   Lab Results: Recent Labs    04/13/20 0359 04/14/20 0602  WBC 8.1 8.9  HGB 11.2* 11.5*  PLT 203 220  MCV 85.6 84.9   Recent Labs    04/12/20 0808 04/13/20 0359 04/14/20 0602  NA 144 140 136  K 3.6 4.9 4.6  CL 94* 94* 91*  CO2 44* 40* 38*  GLUCOSE 112* 101* 80  BUN 11 13 10   CREATININE 0.34* 0.34* 0.37*  CALCIUM 8.9 9.2 8.9   Recent Labs    04/12/20 0808 04/13/20 0359  PROT 5.3* 5.7*  ALBUMIN 3.0* 3.5  AST 51* 38  ALT 33 46*  ALKPHOS 40 45  BILITOT 0.4 0.3    Medications: Scheduled Meds: . Chlorhexidine Gluconate Cloth  6 each Topical Daily  . fluticasone furoate-vilanterol  1 puff Inhalation Daily  . heparin  5,000 Units Subcutaneous Q8H  . insulin aspart  0-9 Units Subcutaneous Q4H  . pantoprazole  40 mg Oral BID  . sodium chloride flush  10-40 mL Intracatheter Q12H  . sucralfate  1 g Per Tube TID WC & HS  . umeclidinium bromide  1 puff Inhalation Daily   Continuous Infusions: PRN Meds:.albuterol, diphenhydrAMINE, morphine injection, ondansetron **OR** ondansetron (ZOFRAN) IV, oxyCODONE, phenol, sodium chloride flush  Assessment/Plan: 64 y.o. 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  Let's have nutrition see her and advise on G tube feeds to start as soon as possible. This may allow adequate  nutrition since her "sma syndrome" still clearly was allowing fluid to pass deeper into her small bowel. If over time the sma compression proves to be more problematic then we can as IR to convert to [place] tube.  I asked IR to evaluate her for G tube insertion site pain, mild tenderness.    Agree with clears only diet, may be able to tolerate full liquids.  Milus Banister, MD  04/14/2020, 8:07 AM [facility] Pager [phone]

## 2020-04-14 NOTE — Progress Notes (Signed)
PROGRESS NOTE    Claudia Wiley [place]   FHL:[id number]  DOB: Aug 31, 1955  DOA: 04/06/2020     7  PCP: [facility]  CC: abd pain  Hospital Course: Ms. 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    General surgery and [facility] consulting.  S/p EGD, esophageal stricture dilatation, placement of NG tube during endoscopy on 9/24.  PICC line placed 9/26, TNA started 9/27. NGT came out overnight of 04/10/20 accidentally and patient was too nervous for replacement but denied any N/V nor abd pain.  After further discussion with GI and surgery, patient will undergo placement of percutaneous gastrostomy tube with eventual conversion to [place] tube.  She also underwent evaluation by SLP and had no obvious oral issues or overt aspiration.  She is of course at increased risk for aspiration due to her longstanding issues with esophageal dilations and strictures.  She was considered appropriate for initiating dysphagia level 2 diet with thin liquids if and when appropriate once procedures are done.  She underwent successful placement of G-tube on 04/13/2020 with interventional radiology. Enteral TF were started on 04/14/20.   Interval History:  No events overnight.  Patient having increased amount of pain in her abdomen this morning at the site of PEG tube.  Later in the  morning, the pain did settle down and she was resting much more comfortably in bed. Tube feeds have also been started and she was tolerating well by mid morning.  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  Initial CT abdomen showed distal esophageal wall thickening, dilated stomach and dilated proximal duodenum to the level of the SMA.  - S/p EGD 9/24, esophageal stricture dilatation, placement of NG tube, showed multiple duodenal ulcers which were biopsied and showed endoscopic appearance consistent with extraluminal compression by the SMA.  Treatment of SMA syndrome would be to improve calorie intake and restore fat pad in between SMA and duodenum.   - GI and General surgery input appreciated.  No surgical interventions at this time.  Continue supportive treatment (NGT displaced on 9/27 but no N/V currently) - Goal is to improve nutrition to restore fat pad in between SMA and duodenum.   - s/p IR placement of PEG on 9/30 with eventual GJ tube conversion if needed/not tolerating G-tube - d/c TPN (stopped 9/30) - continue on enteral TF; appreciate RD consult; will need outpatient plan for TF prior to d/c, likely bolus feeds for simplicity for patient  Chronic dysphagia/recurrent esophageal strictures: Recurrent benign esophageal strictures due to previous acute necrotic esophagus and Candida esophagitis.  Requiring multiple EGDs and dilatations.  Had EGD and dilatation of strictures again on 9/24. - SLP eval on  9/29; technically okay for dysphagia level 2 thin liquids; continue aspiration precautions  - for now starting with CLD to see how patient tolerates and will re-eval   Pruritus and tongue swelling -  resolved  Reported by patient, family and patient's RN on 9/26.  Reportedly having diffuse body itching.?  Tongue swelling without thrush.  Unclear etiology.  Unsure if this is an allergic reaction.  No new medications started - overall has improved and no further swelling appreciated - d/c benadryl, pepcid, and solu-medrol and monitor   Anemia: Hemoglobin is dropped from 13.1-10.4 in the absence of overt bleeding.?  Dilutional.  Follow CBC. 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  COPD/chronic respiratory failure with hypoxia on home oxygen 2 L/min: No clinical bronchospasm.  Essential hypertension: Controlled today.  Hypophosphatemia: Replace in TNA.  Hypoglycemia- resolved  - transient; now s/p D5 - d/c IVF and monitor CBG; change to q4h monitoring   Body mass index is 14.12 kg/m.  Nutritional Status Nutrition Problem: Severe Malnutrition Etiology: chronic illness (COPD, recurrent esophageal strictures) Signs/Symptoms: percent weight loss, energy intake < or equal to 75% for > or equal to 1 month, severe fat depletion, severe muscle depletion Percent weight loss: 22 % (x 9 months) Interventions: Refer to RD note for recommendations  Antimicrobials: none  DVT prophylaxis: HSQ Code Status: Full Family Communication: none Disposition Plan: Status is: Inpatient  Remains inpatient appropriate because:IV treatments appropriate due to intensity of illness or inability to take PO and Inpatient level of care appropriate due to severity of illness   Dispo: The patient is from: Home              Anticipated d/c is to: Home              Anticipated d/c date is: Tentatively for Monday, 10/4              Patient currently is not medically stable to d/c.  Objective: Blood pressure 120/75, pulse 87, temperature 98.3 F (36.8 C), temperature source Oral, resp.  rate 18, height 4\' 11"  (1.499 m), weight 31.7 kg, SpO2 99 %.  Examination: General appearance: frail and thin woman with hoarse voice laying in bed in NAD Head: Normocephalic, without obvious abnormality, atraumatic Eyes: EOMI Lungs: clear to auscultation bilaterally Heart: regular rate and rhythm and S1, S2 normal Abdomen: much more TTP in abdomen notably at PEG site but later in morning pain had improved some; BS present Extremities: thin, no edema Skin: mobility and turgor normal Neurologic: weak but no focal deficits   Consultants:   GI  Surgery  IR  Procedures:  EGD 9/24 NG tube placed during EGD 9/24 PICC line right upper extremity 9/26 IR placed PEG on 9/30  Data Reviewed: I have personally reviewed following labs and imaging studies Results for orders placed or performed during the hospital encounter of 04/06/20 (from the past 24 hour(s))  Glucose, capillary     Status: Abnormal   Collection Time: 04/13/20  5:33 PM  Result Value Ref Range   Glucose-Capillary 113 (H) 70 - 99 mg/dL  Glucose, capillary     Status: None   Collection Time: 04/13/20  8:03 PM  Result Value Ref Range   Glucose-Capillary 91 70 - 99 mg/dL  Glucose, capillary     Status: None   Collection Time: 04/13/20 11:57 PM  Result Value Ref Range  Glucose-Capillary 95 70 - 99 mg/dL  Glucose, capillary     Status: None   Collection Time: 04/14/20  4:01 AM  Result Value Ref Range   Glucose-Capillary 98 70 - 99 mg/dL  Basic metabolic panel     Status: Abnormal   Collection Time: 04/14/20  6:02 AM  Result Value Ref Range   Sodium 136 135 - 145 mmol/L   Potassium 4.6 3.5 - 5.1 mmol/L   Chloride 91 (L) 98 - 111 mmol/L   CO2 38 (H) 22 - 32 mmol/L   Glucose, Bld 80 70 - 99 mg/dL   BUN 10 8 - 23 mg/dL   Creatinine, Ser 0.37 (L) 0.44 - 1.00 mg/dL   Calcium 8.9 8.9 - 10.3 mg/dL   GFR calc non Af Amer >60 >60 mL/min   GFR calc Af Amer >60 >60 mL/min   Anion gap 7 5 - 15  CBC with  Differential/Platelet     Status: Abnormal   Collection Time: 04/14/20  6:02 AM  Result Value Ref Range   WBC 8.9 4.0 - 10.5 K/uL   RBC 4.37 3.87 - 5.11 MIL/uL   Hemoglobin 11.5 (L) 12.0 - 15.0 g/dL   HCT 37.1 36 - 46 %   MCV 84.9 80.0 - 100.0 fL   MCH 26.3 26.0 - 34.0 pg   MCHC 31.0 30.0 - 36.0 g/dL   RDW 16.5 (H) 11.5 - 15.5 %   Platelets 220 150 - 400 K/uL   nRBC 0.0 0.0 - 0.2 %   Neutrophils Relative % 48 %   Neutro Abs 4.3 1.7 - 7.7 K/uL   Lymphocytes Relative 37 %   Lymphs Abs 3.3 0.7 - 4.0 K/uL   Monocytes Relative 11 %   Monocytes Absolute 1.0 0 - 1 K/uL   Eosinophils Relative 4 %   Eosinophils Absolute 0.4 0 - 0 K/uL   Basophils Relative 0 %   Basophils Absolute 0.0 0 - 0 K/uL   Immature Granulocytes 0 %   Abs Immature Granulocytes 0.02 0.00 - 0.07 K/uL  Magnesium     Status: None   Collection Time: 04/14/20  6:02 AM  Result Value Ref Range   Magnesium 1.9 1.7 - 2.4 mg/dL  Glucose, capillary     Status: Abnormal   Collection Time: 04/14/20  7:58 AM  Result Value Ref Range   Glucose-Capillary 103 (H) 70 - 99 mg/dL  Glucose, capillary     Status: Abnormal   Collection Time: 04/14/20 12:16 PM  Result Value Ref Range   Glucose-Capillary 100 (H) 70 - 99 mg/dL    Recent Results (from the past 240 hour(s))  Blood culture (routine single)     Status: None   Collection Time: 04/06/20  9:30 PM   Specimen: BLOOD  Result Value Ref Range Status   Specimen Description   Final    BLOOD LEFT ANTECUBITAL Performed at [facility], [address], [place], [place] [address]    Special Requests   Final    BOTTLES DRAWN AEROBIC AND ANAEROBIC Blood Culture results may not be optimal due to an excessive volume of blood received in culture bottles Performed at [place] [address] [place], [place] [address]    Culture   Final    NO GROWTH 5 DAYS Performed at [facility], [place] [address] [place], [place] [address]    Report  Status 04/12/2020 FINAL  Final  Urine culture     Status:  None   Collection Time: 04/06/20  9:30 PM   Specimen: In/Out Cath Urine  Result Value Ref Range Status   Specimen Description   Final    IN/OUT CATH URINE Performed at [facility], [place] [address] [place], [place] [address]    Special Requests   Final    NONE Performed at [facility], [place] [address] [place], [place] [address]    Culture   Final    NO GROWTH Performed at [facility], [place] [address], [place], [place] [address]    Report Status 04/08/2020 FINAL  Final  Respiratory Panel by RT PCR (Flu A&B, Covid) - Nasopharyngeal Swab     Status: None   Collection Time: 04/06/20 10:26 PM   Specimen: Nasopharyngeal Swab  Result Value Ref Range Status   SARS Coronavirus 2 by RT PCR NEGATIVE NEGATIVE Final    Comment: (NOTE) SARS-CoV-2 target nucleic acids are NOT DETECTED.  The SARS-CoV-2 RNA is generally detectable in upper respiratoy specimens during the acute phase of infection. The lowest concentration of SARS-CoV-2 viral copies this assay can detect is 131 copies/mL. A negative result does not preclude SARS-Cov-2 infection and should not be used as the sole basis for treatment or other patient management decisions. A negative result may occur with  improper specimen collection/handling, submission of specimen other than nasopharyngeal swab, presence of viral mutation(s) within the areas targeted by this assay, and inadequate number of viral copies (<131 copies/mL). A negative result must be combined with clinical observations, patient history, and epidemiological information. The expected result is Negative.  Fact Sheet for Patients:  PinkCheek.be  Fact Sheet for Healthcare Providers:  GravelBags.it  This test is no t yet approved or cleared by the [place] FDA and  has been authorized for detection and/or  diagnosis of SARS-CoV-2 by FDA under an Emergency Use Authorization (EUA). This EUA will remain  in effect (meaning this test can be used) for the duration of the COVID-19 declaration under Section 564(b)(1) of the Act, 21 U.S.C. section 360bbb-3(b)(1), unless the authorization is terminated or revoked sooner.     Influenza A by PCR NEGATIVE NEGATIVE Final   Influenza B by PCR NEGATIVE NEGATIVE Final    Comment: (NOTE) The Xpert Xpress SARS-CoV-2/FLU/RSV assay is intended as an aid in  the diagnosis of influenza from Nasopharyngeal swab specimens and  should not be used as a sole basis for treatment. Nasal washings and  aspirates are unacceptable for Xpert Xpress SARS-CoV-2/FLU/RSV  testing.  Fact Sheet for Patients: PinkCheek.be  Fact Sheet for Healthcare Providers: GravelBags.it  This test is not yet approved or cleared by the [place] FDA and  has been authorized for detection and/or diagnosis of SARS-CoV-2 by  FDA under an Emergency Use Authorization (EUA). This EUA will remain  in effect (meaning this test can be used) for the duration of the  Covid-19 declaration under Section 564(b)(1) of the Act, 21  U.S.C. section 360bbb-3(b)(1), unless the authorization is  terminated or revoked. Performed at [facility], [place] [address], [place], [place] [address]      Radiology Studies: IR GASTROSTOMY TUBE MOD SED  Result Date: 04/13/2020 INDICATION: 64 year old female with history of multiple esophageal strictures requiring percutaneous enteric access for nutritional supplementation. Plan for eventual J arm placement. EXAM: GASTROSTOMY TUBE PLACEMENT COMPARISON:  CT abdomen pelvis from 04/06/2020 MEDICATIONS: Ancef 2 gm IV; Antibiotics were administered within 1 hour of the procedure. CONTRAST:  Twenty mL  of Omnipaque 300 administered into the gastric lumen. ANESTHESIA/SEDATION: Moderate (conscious)  sedation was employed during this procedure. A total of Versed 2 mg and Fentanyl 100 mcg was administered intravenously. Moderate Sedation Time: 32 minutes. The patient's level of consciousness and vital signs were monitored continuously by radiology nursing throughout the procedure under my direct supervision. FLUOROSCOPY TIME:  Three minutes 18 seconds (6 mGy) COMPLICATIONS: None immediate. PROCEDURE: Informed written consent was obtained from the patient following explanation of the procedure, risks, benefits and alternatives. A time out was performed prior to the initiation of the procedure. Ultrasound scanning was performed to demarcate the edge of the left lobe of the liver. Maximal barrier sterile technique utilized including caps, mask, sterile gowns, sterile gloves, large sterile drape, hand hygiene and Betadine prep. Review of pre-procedure CT scan demonstrates an adequate window for percutaneous placement of a gastrostomy tube. The patient was placed on the procedure table in the supine position. Pre-procedure abdominal film confirmed visualization of the transverse colon. An angled 5-French catheter was passed through the nares into the stomach. The patient was prepped and draped in usual sterile fashion. The stomach was insufflated with air via the indwelling nasogastric tube. Under fluoroscopy, a puncture site was selected and local analgesia achieved with 1% lidocaine infiltrated subcutaneously. Under fluoroscopic guidance, a gastropexy needle was passed into the stomach and the T-bar suture was released. Entry into the stomach was confirmed with fluoroscopy, aspiration of air, and injection of contrast material. This was repeated with an additional gastropexy suture (for a total of 2 fasteners). At the center of these gastropexy sutures, a dermatotomy was performed. An 18 gauge needle was passed into the stomach at the site of this dermatotomy, and position within the gastric lumen again confirmed  under fluoroscopy using aspiration of air and contrast injection. An Amplatz guidewire was passed through this needle and intraluminal placement within the stomach was confirmed by fluoroscopy. The needle was removed. Over the guidewire, the percutaneous tract was dilated using a 10 mm x 4 cm semi-compliant angioplasty balloon, which was quickly deflated followed by passage of a 24 French percutaneous gastrostomy tube. The retention balloon of the percutaneous gastrostomy tube was inflated with 20 mL of sterile water with a small amount of contrast material. The tube was withdrawn until the retention balloon was at the edge of the gastric lumen. The external bumper was brought to the abdominal wall. Contrast was injected through the gastrostomy tube, confirming intraluminal positioning. The patient tolerated the procedure well without any immediate post-procedural complications. FINDINGS: After successful fluoroscopic guided placement, the gastrostomy tube is appropriately positioned with internal retention balloon against the ventral aspect of the gastric lumen. IMPRESSION: Successful fluoroscopic insertion of a 24 French balloon retention gastrostomy tube. Ruthann Cancer, MD [facility] Electronically Signed   By: Ruthann Cancer MD   On: 04/13/2020 17:12   IR GASTROSTOMY TUBE MOD SED  Final Result    DG UGI W SINGLE CM (SOL OR THIN BA)  Final Result    [place] EKG SITE RITE  Final Result    DG Abd 1 View - KUB  Final Result    CT ABDOMEN PELVIS W CONTRAST  Final Result    DG Chest Port 1 View  Final Result      Scheduled Meds: . Chlorhexidine Gluconate Cloth  6 each Topical Daily  . fluticasone furoate-vilanterol  1 puff Inhalation Daily  . heparin  5,000 Units Subcutaneous Q8H  . insulin aspart  0-9 Units Subcutaneous Q4H  . pantoprazole  40 mg Oral BID  . sodium chloride flush  10-40 mL Intracatheter Q12H  . sucralfate  1 g Per Tube  TID WC & HS  . umeclidinium bromide  1 puff Inhalation Daily   PRN Meds: albuterol, diphenhydrAMINE, morphine injection, ondansetron **OR** ondansetron (ZOFRAN) IV, oxyCODONE, phenol, sodium chloride flush Continuous Infusions: . feeding supplement (OSMOLITE 1.2 CAL) 1,000 mL (04/14/20 1028)      LOS: 7 days  Time spent: Greater than 50% of the 35 minute visit was spent in counseling/coordination of care for the patient as laid out in the A&P.   Dwyane Dee, MD [facility] 04/14/2020, 1:47 PM  Contact via secure chat.  To contact the attending provider between 7A-7P or the covering provider during after hours 7P-7A, please log into the web site [email or web address] and access using universal [place] password for that web site. If you do not have the password, please call the hospital operator.

## 2020-04-15 LAB — CBC WITH DIFFERENTIAL/PLATELET
Abs Immature Granulocytes: 0.02 10*3/uL (ref 0.00–0.07)
Basophils Absolute: 0 10*3/uL (ref 0.0–0.1)
Basophils Relative: 0 %
Eosinophils Absolute: 0.3 10*3/uL (ref 0.0–0.5)
Eosinophils Relative: 4 %
HCT: 35.8 % — ABNORMAL LOW (ref 36.0–46.0)
Hemoglobin: 11.1 g/dL — ABNORMAL LOW (ref 12.0–15.0)
Immature Granulocytes: 0 %
Lymphocytes Relative: 43 %
Lymphs Abs: 3.2 10*3/uL (ref 0.7–4.0)
MCH: 26.3 pg (ref 26.0–34.0)
MCHC: 31 g/dL (ref 30.0–36.0)
MCV: 84.8 fL (ref 80.0–100.0)
Monocytes Absolute: 1 10*3/uL (ref 0.1–1.0)
Monocytes Relative: 14 %
Neutro Abs: 2.8 10*3/uL (ref 1.7–7.7)
Neutrophils Relative %: 39 %
Platelets: 239 10*3/uL (ref 150–400)
RBC: 4.22 MIL/uL (ref 3.87–5.11)
RDW: 16 % — ABNORMAL HIGH (ref 11.5–15.5)
WBC: 7.3 10*3/uL (ref 4.0–10.5)
nRBC: 0 % (ref 0.0–0.2)

## 2020-04-15 LAB — BASIC METABOLIC PANEL
Anion gap: 6 (ref 5–15)
BUN: 11 mg/dL (ref 8–23)
CO2: 36 mmol/L — ABNORMAL HIGH (ref 22–32)
Calcium: 8.9 mg/dL (ref 8.9–10.3)
Chloride: 90 mmol/L — ABNORMAL LOW (ref 98–111)
Creatinine, Ser: 0.41 mg/dL — ABNORMAL LOW (ref 0.44–1.00)
GFR calc Af Amer: >60 mL/min (ref >60)
GFR calc non Af Amer: >60 mL/min (ref >60)
Glucose, Bld: 123 mg/dL — ABNORMAL HIGH (ref 70–99)
Potassium: 4.4 mmol/L (ref 3.5–5.1)
Sodium: 132 mmol/L — ABNORMAL LOW (ref 135–145)

## 2020-04-15 LAB — GLUCOSE, CAPILLARY
Glucose-Capillary: 120 mg/dL — ABNORMAL HIGH (ref 70–99)
Glucose-Capillary: 118 mg/dL — ABNORMAL HIGH (ref 70–99)
Glucose-Capillary: 166 mg/dL — ABNORMAL HIGH (ref 70–99)
Glucose-Capillary: 83 mg/dL (ref 70–99)
Glucose-Capillary: 117 mg/dL — ABNORMAL HIGH (ref 70–99)
Glucose-Capillary: 129 mg/dL — ABNORMAL HIGH (ref 70–99)

## 2020-04-15 LAB — GASTRIN: Gastrin: 90 pg/mL (ref 0–115)

## 2020-04-15 LAB — MAGNESIUM: Magnesium: 1.8 mg/dL (ref 1.7–2.4)

## 2020-04-15 MED ORDER — ACETAMINOPHEN 160 MG/5ML PO SOLN: 650.0000 mg | ORAL | Status: DC | PRN

## 2020-04-15 MED ORDER — PANTOPRAZOLE SODIUM 40 MG PO PACK
40.0000 mg | PACK | Freq: Two times a day (BID) | ORAL | Status: DC
  Administered 2020-04-15 – 2020-04-19 (×8): 40 mg
  Filled 2020-04-15 (×11): qty 20

## 2020-04-15 MED ORDER — OXYCODONE HCL 5 MG PO TABS
5.0000 mg | ORAL_TABLET | ORAL | Status: DC | PRN
  Administered 2020-04-15 – 2020-04-16 (×5): 5 mg
  Filled 2020-04-15 (×5): qty 1

## 2020-04-15 NOTE — Progress Notes (Signed)
PROGRESS NOTE    Claudia Wiley [place]   LOV:[id number]  DOB: Aug 27, 1955  DOA: 04/06/2020     8  PCP: [facility]  CC: abd pain  Hospital Course: Ms. 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    General surgery and [facility] consulting.  S/p EGD, esophageal stricture dilatation, placement of NG tube during endoscopy on 9/24.  PICC line placed 9/26, TNA started 9/27. NGT came out overnight of 04/10/20 accidentally and patient was too nervous for replacement but denied any N/V nor abd pain.  After further discussion with GI and surgery, patient will undergo placement of percutaneous gastrostomy tube with eventual conversion to [place] tube.  She also underwent evaluation by SLP and had no obvious oral issues or overt aspiration.  She is of course at increased risk for aspiration due to her longstanding issues with esophageal dilations and strictures.  She was considered appropriate for initiating dysphagia level 2 diet with thin liquids if and when appropriate once procedures are done.  She underwent successful placement of G-tube on 04/13/2020 with interventional radiology. Enteral TF were started on 04/14/20.   Interval History:  No events overnight.   Pain is much better this morning.  She is resting comfortably in bed in no distress.  Tolerating tube feeds  well.  Having flatus.  Old records reviewed in assessment of this patient  ROS: Constitutional: negative for chills and fevers, Respiratory: negative for cough, Cardiovascular: negative for chest pain and Gastrointestinal: positive for abdominal pain  Assessment & Plan:  * SMAS (superior mesenteric artery syndrome) ([place]) Reportedly started approximately 48 hours after initial Covid vaccine. Initial CT abdomen showed distal esophageal wall thickening, dilated stomach and dilated proximal duodenum to the level of the SMA.  - S/p EGD 9/24, esophageal stricture dilatation, placement of NG tube, showed multiple duodenal ulcers which were biopsied and showed endoscopic appearance consistent with extraluminal compression by the SMA. Treatment of SMA syndrome would be to improve calorie intake and restore fat pad in between SMA and duodenum.  - GI and General surgery input appreciated. No surgical interventions at this time. Continue supportive treatment (NGT displaced on 9/27 but no N/V currently) - Goal is to improve nutrition to restore fat pad in between SMA and duodenum.  - s/p IR placement of PEG on 9/30 with eventual GJ tube conversion if needed/not tolerating G-tube - d/c TPN (stopped 9/30) - continue on enteral TF; appreciate RD consult; will need outpatient plan for TF prior to d/c (not a good bolus candidate per RD?, will follow up Monday) - continue TF, seems to be tolerating well; mild nausea, no vomiting  Esophageal stricture Recurrent benign esophageal strictures due to previous acute necrotic esophagus and Candida esophagitis. Requiring multiple EGDs and dilatations. Had EGD and dilatation of strictures again on 9/24. 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 Reportedly having diffuse bodyitching.? Tongue swelling without thrush. Unclear etiology. Unsure if this is an allergic reaction. No new medications started - overall has improved and no further swelling appreciated - d/c benadryl, pepcid, and solu-medrol and monitor   Body mass index is 14.12 kg/m.  Nutritional Status Nutrition Problem: Severe Malnutrition Etiology: chronic illness (COPD, recurrent esophageal strictures) Signs/Symptoms: percent weight loss, energy intake < or equal to 75% for > or equal to 1 month, severe fat depletion, severe muscle depletion Percent weight loss: 22 % (x 9 months) Interventions: Refer to RD note for recommendations  Antimicrobials: none  DVT prophylaxis: HSQ Code Status: Full Family Communication: none Disposition Plan: Status is: Inpatient  Remains inpatient appropriate because:IV treatments appropriate due to intensity of illness or inability to take PO and Inpatient level of care appropriate due to severity of illness   Dispo: The patient is from: Home              Anticipated d/c is to: Home              Anticipated d/c date is: Tentatively for Monday, 10/4              Patient currently is not medically stable to d/c.  Objective: Blood pressure 91/68, pulse 84, temperature 98.5 F (36.9 C), temperature source Oral, resp. rate 16, height 4\' 11"  (1.499 m), weight 31.7 kg, SpO2 99 %.  Examination: General appearance: frail and thin woman with hoarse voice laying in bed in NAD Head: Normocephalic, without  obvious abnormality, atraumatic Eyes: EOMI Lungs: clear to auscultation bilaterally Heart: regular rate and rhythm and S1, S2 normal Abdomen: minimal TTP in abdomen; BS present Extremities: thin, no edema Skin: mobility and turgor normal Neurologic: weak but no focal deficits   Consultants:   GI  Surgery  IR  Procedures:  EGD 9/24 NG tube placed during EGD 9/24 PICC line right upper extremity 9/26 IR placed PEG on 9/30  Data Reviewed: I have personally reviewed following labs and imaging studies Results for orders placed or performed during the hospital encounter of 04/06/20 (from the past 24 hour(s))  Glucose, capillary     Status: None   Collection Time: 04/14/20  4:29 PM  Result Value Ref Range   Glucose-Capillary 95 70 - 99 mg/dL  Glucose, capillary     Status: Abnormal   Collection Time: 04/14/20  7:57 PM  Result Value Ref Range   Glucose-Capillary 140 (H) 70 - 99 mg/dL  Glucose, capillary     Status: Abnormal   Collection Time: 04/15/20  1:20 AM  Result Value Ref Range   Glucose-Capillary 120 (H) 70 - 99 mg/dL  Basic metabolic panel     Status: Abnormal   Collection Time: 04/15/20  4:04 AM  Result Value Ref Range   Sodium 132 (L) 135 - 145 mmol/L   Potassium 4.4 3.5 - 5.1 mmol/L   Chloride 90 (L) 98 - 111 mmol/L   CO2  36 (H) 22 - 32 mmol/L   Glucose, Bld 123 (H) 70 - 99 mg/dL   BUN 11 8 - 23 mg/dL   Creatinine, Ser 0.41 (L) 0.44 - 1.00 mg/dL   Calcium 8.9 8.9 - 10.3 mg/dL   GFR calc non Af Amer >60 >60 mL/min   GFR calc Af Amer >60 >60 mL/min   Anion gap 6 5 - 15  CBC with Differential/Platelet     Status: Abnormal   Collection Time: 04/15/20  4:04 AM  Result Value Ref Range   WBC 7.3 4.0 - 10.5 K/uL   RBC 4.22 3.87 - 5.11 MIL/uL   Hemoglobin 11.1 (L) 12.0 - 15.0 g/dL   HCT 35.8 (L) 36 - 46 %   MCV 84.8 80.0 - 100.0 fL   MCH 26.3 26.0 - 34.0 pg   MCHC 31.0 30.0 - 36.0 g/dL   RDW 16.0 (H) 11.5 - 15.5 %   Platelets 239 150 - 400 K/uL   nRBC 0.0 0.0 -  0.2 %   Neutrophils Relative % 39 %   Neutro Abs 2.8 1.7 - 7.7 K/uL   Lymphocytes Relative 43 %   Lymphs Abs 3.2 0.7 - 4.0 K/uL   Monocytes Relative 14 %   Monocytes Absolute 1.0 0 - 1 K/uL   Eosinophils Relative 4 %   Eosinophils Absolute 0.3 0 - 0 K/uL   Basophils Relative 0 %   Basophils Absolute 0.0 0 - 0 K/uL   Immature Granulocytes 0 %   Abs Immature Granulocytes 0.02 0.00 - 0.07 K/uL  Magnesium     Status: None   Collection Time: 04/15/20  4:04 AM  Result Value Ref Range   Magnesium 1.8 1.7 - 2.4 mg/dL  Glucose, capillary     Status: Abnormal   Collection Time: 04/15/20  4:36 AM  Result Value Ref Range   Glucose-Capillary 118 (H) 70 - 99 mg/dL  Glucose, capillary     Status: Abnormal   Collection Time: 04/15/20  7:44 AM  Result Value Ref Range   Glucose-Capillary 166 (H) 70 - 99 mg/dL  Glucose, capillary     Status: None   Collection Time: 04/15/20 12:10 PM  Result Value Ref Range   Glucose-Capillary 83 70 - 99 mg/dL    Recent Results (from the past 240 hour(s))  Blood culture (routine single)     Status: None   Collection Time: 04/06/20  9:30 PM   Specimen: BLOOD  Result Value Ref Range Status   Specimen Description   Final    BLOOD LEFT ANTECUBITAL Performed at [facility], [place] [address] [place], [place] [address]    Special Requests   Final    BOTTLES DRAWN AEROBIC AND ANAEROBIC Blood Culture results may not be optimal due to an excessive volume of blood received in culture bottles Performed at [facility] [address], [place], [place] [address]    Culture   Final    NO GROWTH 5 DAYS Performed at [facility], [place] [address], [place], [place] [address]    Report Status 04/12/2020 FINAL  Final  Urine culture     Status: None   Collection Time: 04/06/20  9:30 PM   Specimen: In/Out Cath Urine  Result Value Ref Range Status   Specimen Description   Final    IN/OUT CATH URINE Performed at [facility] [address], [place], [place] [address]    Special Requests   Final  NONE Performed at [facility], [place] [address] [place], [place] [address]    Culture   Final    NO GROWTH Performed at [facility], [place] [address] [place], [place] [address]    Report Status 04/08/2020 FINAL  Final  Respiratory Panel by RT PCR (Flu A&B, Covid) - Nasopharyngeal Swab     Status: None   Collection Time: 04/06/20 10:26 PM   Specimen: Nasopharyngeal Swab  Result Value Ref Range Status   SARS Coronavirus 2 by RT PCR NEGATIVE NEGATIVE Final    Comment: (NOTE) SARS-CoV-2 target nucleic acids are NOT DETECTED.  The SARS-CoV-2 RNA is generally detectable in upper respiratoy specimens during the acute phase of infection. The lowest concentration of SARS-CoV-2 viral copies this assay can detect is 131 copies/mL. A negative result does not preclude SARS-Cov-2 infection and should not be used as the sole basis for treatment or other patient management decisions. A negative result may occur with  improper specimen collection/handling, submission of specimen other than nasopharyngeal swab, presence of viral mutation(s) within the areas targeted by this assay, and inadequate number of viral copies (<131 copies/mL). A negative result must be combined with clinical observations, patient history, and epidemiological information. The expected result is Negative.  Fact Sheet for Patients:  PinkCheek.be  Fact Sheet for Healthcare Providers:  GravelBags.it  This test is no t yet approved or cleared by the [place] FDA and  has been authorized for detection and/or diagnosis of SARS-CoV-2 by FDA under an Emergency Use Authorization (EUA). This EUA will remain  in effect (meaning this test can be used) for the duration of the COVID-19 declaration under Section 564(b)(1) of the Act, 21 U.S.C. section  360bbb-3(b)(1), unless the authorization is terminated or revoked sooner.     Influenza A by PCR NEGATIVE NEGATIVE Final   Influenza B by PCR NEGATIVE NEGATIVE Final    Comment: (NOTE) The Xpert Xpress SARS-CoV-2/FLU/RSV assay is intended as an aid in  the diagnosis of influenza from Nasopharyngeal swab specimens and  should not be used as a sole basis for treatment. Nasal washings and  aspirates are unacceptable for Xpert Xpress SARS-CoV-2/FLU/RSV  testing.  Fact Sheet for Patients: PinkCheek.be  Fact Sheet for Healthcare Providers: GravelBags.it  This test is not yet approved or cleared by the [place] FDA and  has been authorized for detection and/or diagnosis of SARS-CoV-2 by  FDA under an Emergency Use Authorization (EUA). This EUA will remain  in effect (meaning this test can be used) for the duration of the  Covid-19 declaration under Section 564(b)(1) of the Act, 21  U.S.C. section 360bbb-3(b)(1), unless the authorization is  terminated or revoked. Performed at [facility], [place] [address] [place], [place] [address]      Radiology Studies: IR GASTROSTOMY TUBE MOD SED  Result Date: 04/13/2020 INDICATION: 64 year old female with history of multiple esophageal strictures requiring percutaneous enteric access for nutritional supplementation. Plan for eventual J arm placement. EXAM: GASTROSTOMY TUBE PLACEMENT COMPARISON:  CT abdomen pelvis from 04/06/2020 MEDICATIONS: Ancef 2 gm IV; Antibiotics were administered within 1 hour of the procedure. CONTRAST:  Twenty mL of Omnipaque 300 administered into the gastric lumen. ANESTHESIA/SEDATION: Moderate (conscious) sedation was employed during this procedure. A total of Versed 2 mg and Fentanyl 100 mcg was administered intravenously. Moderate Sedation Time: 32 minutes. The patient's level of consciousness and vital signs were monitored continuously by  radiology nursing throughout the procedure under my direct supervision. FLUOROSCOPY  TIME:  Three minutes 18 seconds (6 mGy) COMPLICATIONS: None immediate. PROCEDURE: Informed written consent was obtained from the patient following explanation of the procedure, risks, benefits and alternatives. A time out was performed prior to the initiation of the procedure. Ultrasound scanning was performed to demarcate the edge of the left lobe of the liver. Maximal barrier sterile technique utilized including caps, mask, sterile gowns, sterile gloves, large sterile drape, hand hygiene and Betadine prep. Review of pre-procedure CT scan demonstrates an adequate window for percutaneous placement of a gastrostomy tube. The patient was placed on the procedure table in the supine position. Pre-procedure abdominal film confirmed visualization of the transverse colon. An angled 5-French catheter was passed through the nares into the stomach. The patient was prepped and draped in usual sterile fashion. The stomach was insufflated with air via the indwelling nasogastric tube. Under fluoroscopy, a puncture site was selected and local analgesia achieved with 1% lidocaine infiltrated subcutaneously. Under fluoroscopic guidance, a gastropexy needle was passed into the stomach and the T-bar suture was released. Entry into the stomach was confirmed with fluoroscopy, aspiration of air, and injection of contrast material. This was repeated with an additional gastropexy suture (for a total of 2 fasteners). At the center of these gastropexy sutures, a dermatotomy was performed. An 18 gauge needle was passed into the stomach at the site of this dermatotomy, and position within the gastric lumen again confirmed under fluoroscopy using aspiration of air and contrast injection. An Amplatz guidewire was passed through this needle and intraluminal placement within the stomach was confirmed by fluoroscopy. The needle was removed. Over the guidewire, the  percutaneous tract was dilated using a 10 mm x 4 cm semi-compliant angioplasty balloon, which was quickly deflated followed by passage of a 24 French percutaneous gastrostomy tube. The retention balloon of the percutaneous gastrostomy tube was inflated with 20 mL of sterile water with a small amount of contrast material. The tube was withdrawn until the retention balloon was at the edge of the gastric lumen. The external bumper was brought to the abdominal wall. Contrast was injected through the gastrostomy tube, confirming intraluminal positioning. The patient tolerated the procedure well without any immediate post-procedural complications. FINDINGS: After successful fluoroscopic guided placement, the gastrostomy tube is appropriately positioned with internal retention balloon against the ventral aspect of the gastric lumen. IMPRESSION: Successful fluoroscopic insertion of a 24 French balloon retention gastrostomy tube. Ruthann Cancer, MD [facility] Radiology Electronically Signed   By: Ruthann Cancer MD   On: 04/13/2020 17:12   IR GASTROSTOMY TUBE MOD SED  Final Result    DG UGI W SINGLE CM (SOL OR THIN BA)  Final Result    [place] EKG SITE RITE  Final Result    DG Abd 1 View - KUB  Final Result    CT ABDOMEN PELVIS W CONTRAST  Final Result    DG Chest Port 1 View  Final Result      Scheduled Meds: . Chlorhexidine Gluconate Cloth  6 each Topical Daily  . fluticasone furoate-vilanterol  1 puff Inhalation Daily  . heparin  5,000 Units Subcutaneous Q8H  . insulin aspart  0-9 Units Subcutaneous Q4H  . pantoprazole  40 mg Oral BID  . sodium chloride flush  10-40 mL Intracatheter Q12H  . sucralfate  1 g Per Tube TID WC & HS  . umeclidinium bromide  1 puff Inhalation Daily   PRN Meds: albuterol, diphenhydrAMINE, morphine injection, ondansetron **OR** ondansetron (ZOFRAN) IV, oxyCODONE,  phenol, sodium chloride flush Continuous Infusions: . feeding  supplement (OSMOLITE 1.2 CAL) 1,000 mL (04/14/20 1028)      LOS: 8 days  Time spent: Greater than 50% of the 35 minute visit was spent in counseling/coordination of care for the patient as laid out in the A&P.   Dwyane Dee, MD [facility] 04/15/2020, 1:20 PM  Contact via secure chat.  To contact the attending provider between 7A-7P or the covering provider during after hours 7P-7A, please log into the web site [email or web address] and access using universal [place] password for that web site. If you do not have the password, please call the hospital operator.

## 2020-04-15 NOTE — Progress Notes (Signed)
[facility] Progress Note    Since last GI note: Tube feeds via IR placed Gtube were started yesterday. Currently at 20cc/hour.  No vomiting. Still has her chronic abd pains. The G tube site pain is improved.  Objective: Vital signs in last 24 hours: Temp:  [98.2 F (36.8 C)-98.5 F (36.9 C)] 98.5 F (36.9 C) (10/02 0439) Pulse Rate:  [80-85] 84 (10/02 0439) Resp:  [14-16] 16 (10/02 0439) BP: (91-110)/(68-72) 91/68 (10/02 0439) SpO2:  [99 %-100 %] 99 % (10/02 0439) Last BM Date: 04/12/20 (per patient) General: alert and oriented times 3 Heart: regular rate and rythm Abdomen: soft, mildly tender throughout, non-distended, normal bowel sounds  Lab Results: Recent Labs    04/13/20 0359 04/14/20 0602 04/15/20 0404  WBC 8.1 8.9 7.3  HGB 11.2* 11.5* 11.1*  PLT 203 220 239  MCV 85.6 84.9 84.8   Recent Labs    04/13/20 0359 04/14/20 0602 04/15/20 0404  NA 140 136 132*  K 4.9 4.6 4.4  CL 94* 91* 90*  CO2 40* 38* 36*  GLUCOSE 101* 80 123*  BUN 13 10 11   CREATININE 0.34* 0.37* 0.41*  CALCIUM 9.2 8.9 8.9   Recent Labs    04/12/20 0808 04/13/20 0359  PROT 5.3* 5.7*  ALBUMIN 3.0* 3.5  AST 51* 38  ALT 33 46*  ALKPHOS 40 45  BILITOT 0.4 0.3    Medications: Scheduled Meds: . Chlorhexidine Gluconate Cloth  6 each Topical Daily  . fluticasone furoate-vilanterol  1 puff Inhalation Daily  . heparin  5,000 Units Subcutaneous Q8H  . insulin aspart  0-9 Units Subcutaneous Q4H  . pantoprazole  40 mg Oral BID  . sodium chloride flush  10-40 mL Intracatheter Q12H  . sucralfate  1 g Per Tube TID WC & HS  . umeclidinium bromide  1 puff Inhalation Daily   Continuous Infusions: . feeding supplement (OSMOLITE 1.2 CAL) 1,000 mL (04/14/20 1028)   PRN Meds:.albuterol, diphenhydrAMINE, morphine injection, ondansetron **OR** ondansetron (ZOFRAN) IV, oxyCODONE, phenol, sodium chloride flush   Assessment/Plan: 64 y.o. female with severe malnutrition, esophageal  stricturing process leading to partial SMA related narrowing of the duodenum  I think she is safe for d/c as soon as TF are set up for home. I think bolus feeding will probably be best in her case. She can and should still use her esophagus with soup, ensure type consistency but I don't think that she should be eating anything that requires her to chew until we have made more headway with her diffuse esophageal stricturing process (which may or may not ever allow adequate oral intake to support her needs).  I am signing off for now but am around all weekend.  [facility] will reach out to her for follow up OV with Dr. Fuller Plan or extender in the next few weeks to decide on timing of next EGD, dilation.  Hopefull she will have started putting weight back on by then. She should be on PPI BID indefinitely.   Milus Banister, MD  04/15/2020, 7:01 AM [facility] Pager [phone]

## 2020-04-16 LAB — GLUCOSE, CAPILLARY
Glucose-Capillary: 110 mg/dL — ABNORMAL HIGH (ref 70–99)
Glucose-Capillary: 114 mg/dL — ABNORMAL HIGH (ref 70–99)
Glucose-Capillary: 133 mg/dL — ABNORMAL HIGH (ref 70–99)
Glucose-Capillary: 137 mg/dL — ABNORMAL HIGH (ref 70–99)
Glucose-Capillary: 142 mg/dL — ABNORMAL HIGH (ref 70–99)
Glucose-Capillary: 156 mg/dL — ABNORMAL HIGH (ref 70–99)
Glucose-Capillary: 53 mg/dL — ABNORMAL LOW (ref 70–99)
Glucose-Capillary: 64 mg/dL — ABNORMAL LOW (ref 70–99)

## 2020-04-16 MED ORDER — OXYCODONE HCL 5 MG/5ML PO SOLN
10.0000 mg | ORAL | Status: DC | PRN
  Administered 2020-04-16 – 2020-04-19 (×14): 10 mg
  Filled 2020-04-16 (×15): qty 10

## 2020-04-16 MED ORDER — DOCUSATE SODIUM 50 MG/5ML PO LIQD
100.0000 mg | Freq: Every day | ORAL | Status: DC
  Administered 2020-04-16 – 2020-04-19 (×4): 100 mg
  Filled 2020-04-16 (×4): qty 10

## 2020-04-16 MED ORDER — ENSURE ENLIVE PO LIQD
237.0000 mL | Freq: Two times a day (BID) | ORAL | Status: DC
  Administered 2020-04-16 – 2020-04-19 (×5): 237 mL via ORAL

## 2020-04-16 MED ORDER — POLYETHYLENE GLYCOL 3350 17 G PO PACK
17.0000 g | PACK | Freq: Every day | ORAL | Status: DC
  Administered 2020-04-17 – 2020-04-18 (×2): 17 g
  Filled 2020-04-16 (×2): qty 1

## 2020-04-16 NOTE — Progress Notes (Signed)
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    General surgery and [facility] consulting.  S/p EGD, esophageal stricture dilatation, placement of NG tube during endoscopy on 9/24.  PICC line placed 9/26, TNA started 9/27. NGT came out overnight of 04/10/20 accidentally and patient was too nervous for replacement but denied any N/V nor abd pain.  After further discussion with GI and surgery, patient will undergo placement of percutaneous gastrostomy tube with eventual conversion to [place] tube.  She also underwent evaluation by SLP and had no obvious oral issues or overt aspiration.  She is of course at increased risk for aspiration due to her longstanding issues with esophageal dilations and strictures.  She was considered appropriate for initiating dysphagia level 2 diet with thin liquids if and when appropriate once procedures are done.  She underwent successful placement of G-tube on 04/13/2020 with interventional radiology. Enteral TF were started on 04/14/20.   Interval History:  No events overnight.   Daughter bedside this morning.  Questions answered.  Understands tentative plan is for discharging home  tomorrow if we are able to arrange for tube feeding pump if needed through the [place].  She is still having ongoing abdominal pain but tolerating well and oxycodone does seem to help.  Denies any nausea or vomiting.  Having flatus but no bowel movement now in a couple days.  Old records reviewed in assessment of this patient  ROS: Constitutional: negative for chills and fevers, Respiratory: negative for cough, Cardiovascular: negative for chest pain and Gastrointestinal: positive for abdominal pain  Assessment & Plan:  * SMAS (superior mesenteric artery syndrome) ([place]) Reportedly started approximately 48 hours after initial Covid vaccine. Initial CT abdomen showed distal esophageal wall thickening, dilated stomach and dilated proximal duodenum to the level of the SMA.  - S/p EGD 9/24, esophageal stricture dilatation, placement of NG tube, showed multiple duodenal ulcers which were biopsied and showed endoscopic appearance consistent with extraluminal compression by the SMA. Treatment of SMA syndrome would be to improve calorie intake and restore fat pad in between SMA and duodenum.  - GI and General surgery input appreciated. No surgical interventions at this time. Continue supportive treatment (NGT displaced on 9/27 but no N/V currently) - Goal is to improve nutrition to restore fat pad in between SMA and duodenum.  - s/p IR placement of PEG on 9/30 with eventual GJ tube conversion if needed/not tolerating G-tube - d/c TPN (stopped 9/30) - continue on enteral TF; appreciate RD consult; will need outpatient plan for TF prior to d/c (not a good bolus candidate per RD?, will follow up Monday) - continue TF, seems to be tolerating well; mild nausea, no vomiting  Esophageal stricture Recurrent benign esophageal  strictures due to previous acute necrotic esophagus and Candida esophagitis. Requiring multiple EGDs and dilatations. Had EGD and dilatation of strictures again on 9/24. - SLP eval on  9/29; technically okay for dysphagia level 2 thin liquids; continue aspiration precautions  - continue liquid diet orally for now and at discharge; long term management with GI and possible advancement in the future  Dysphagia - see esophageal stricture - CLD for now  Protein-calorie malnutrition, severe - continue TF - see esophageal stricture   Normocytic anemia - intermittent CBC  Gastric outlet obstruction - see esophageal stricture   Essential hypertension - controlled - continue current regimen   Gastrinoma S/p surgery 2014/15 - f/u pending gastrin level   Pruritus-resolved as of 04/14/2020 Reported by patient, family and patient's RNon 9/26. Reportedly having diffuse bodyitching.? Tongue swelling without thrush. Unclear etiology. Unsure if this is an allergic reaction. No new medications started - overall has improved and no further swelling appreciated - d/c benadryl, pepcid, and solu-medrol and monitor   Body mass index is 14.12 kg/m.  Nutritional Status Nutrition Problem: Severe Malnutrition Etiology: chronic illness (COPD, recurrent esophageal strictures) Signs/Symptoms: percent weight loss, energy intake < or equal to 75% for > or equal to 1 month, severe fat depletion, severe muscle depletion Percent weight loss: 22 % (x 9 months) Interventions: Refer to RD note for recommendations  Antimicrobials: none  DVT prophylaxis: HSQ Code Status: Full Family Communication: none Disposition Plan: Status is: Inpatient  Remains inpatient appropriate because:IV treatments appropriate due to intensity of illness or inability to take PO and Inpatient level of care appropriate due to severity of illness   Dispo: The patient is from: Home              Anticipated d/c is to: Home              Anticipated d/c date is: Tentatively for Monday, 10/4              Patient currently is not medically stable to d/c.  Objective: Blood pressure 98/62, pulse 78,  temperature 98.4 F (36.9 C), temperature source Oral, resp. rate 16, height 4\' 11"  (1.499 m), weight 31.7 kg, SpO2 99 %.  Examination: General appearance: frail and thin woman with hoarse voice laying in bed in NAD Head: Normocephalic, without obvious abnormality, atraumatic Eyes: EOMI Lungs: clear to auscultation bilaterally Heart: regular rate and rhythm and S1, S2 normal Abdomen: minimal TTP in abdomen; BS present Extremities: thin, no edema Skin: mobility and turgor normal Neurologic: weak but no focal deficits   Consultants:   GI  Surgery  IR  Procedures:  EGD 9/24 NG tube placed during EGD 9/24 PICC line right upper extremity 9/26 IR placed PEG on 9/30  Data Reviewed: I have personally reviewed following labs and imaging studies Results for orders placed or performed during the hospital encounter of 04/06/20 (from the past 24 hour(s))  Glucose, capillary     Status: Abnormal   Collection Time: 04/15/20  4:09 PM  Result Value Ref Range   Glucose-Capillary 117 (H) 70 - 99 mg/dL  Glucose, capillary     Status: Abnormal   Collection Time: 04/15/20  9:56 PM  Result Value Ref Range   Glucose-Capillary 129 (H) 70 - 99 mg/dL  Glucose, capillary     Status: Abnormal   Collection Time: 04/16/20 12:56 AM  Result Value Ref Range   Glucose-Capillary 114 (H) 70 - 99 mg/dL  Glucose, capillary     Status: Abnormal  Collection Time: 04/16/20  5:50 AM  Result Value Ref Range   Glucose-Capillary 110 (H) 70 - 99 mg/dL  Glucose, capillary     Status: Abnormal   Collection Time: 04/16/20  8:03 AM  Result Value Ref Range   Glucose-Capillary 137 (H) 70 - 99 mg/dL  Glucose, capillary     Status: Abnormal   Collection Time: 04/16/20 12:25 PM  Result Value Ref Range   Glucose-Capillary 156 (H) 70 - 99 mg/dL    Recent Results (from the past 240 hour(s))  Blood culture (routine single)     Status: None   Collection Time: 04/06/20  9:30 PM   Specimen: BLOOD  Result Value Ref Range  Status   Specimen Description   Final    BLOOD LEFT ANTECUBITAL Performed at [facility] [address] [place], [place] [address]    Special Requests   Final    BOTTLES DRAWN AEROBIC AND ANAEROBIC Blood Culture results may not be optimal due to an excessive volume of blood received in culture bottles Performed at [facility] [address] [place], [place] [address]    Culture   Final    NO GROWTH 5 DAYS Performed at [facility], [place] [address], [place], [place] [address]    Report Status 04/12/2020 FINAL  Final  Urine culture     Status: None   Collection Time: 04/06/20  9:30 PM   Specimen: In/Out Cath Urine  Result Value Ref Range Status   Specimen Description   Final    IN/OUT CATH URINE Performed at [facility] [address], [place], [place] [address]    Special Requests   Final    NONE Performed at [facility], [place] [address] [place], [place] [address]    Culture   Final    NO GROWTH Performed at [facility], [place] [address] [place], [place] [address]    Report Status 04/08/2020 FINAL  Final  Respiratory Panel by RT PCR (Flu A&B, Covid) - Nasopharyngeal Swab     Status: None   Collection Time: 04/06/20 10:26 PM   Specimen: Nasopharyngeal Swab  Result Value Ref Range Status   SARS Coronavirus 2 by RT PCR NEGATIVE NEGATIVE Final    Comment: (NOTE) SARS-CoV-2 target nucleic acids are NOT DETECTED.  The SARS-CoV-2 RNA is generally detectable in upper respiratoy specimens during the acute phase of infection. The lowest concentration of SARS-CoV-2 viral copies this assay can detect is 131 copies/mL. A negative result does not preclude SARS-Cov-2 infection and should not be used as the sole basis for treatment or other patient management decisions. A negative result may occur with  improper specimen collection/handling, submission of specimen other than nasopharyngeal  swab, presence of viral mutation(s) within the areas targeted by this assay, and inadequate number of viral copies (<131 copies/mL). A negative result must be combined with clinical observations, patient history, and epidemiological information. The expected result is Negative.  Fact Sheet for Patients:  PinkCheek.be  Fact Sheet for Healthcare Providers:  GravelBags.it  This test is no t yet approved or cleared by the [place] FDA and  has been authorized for detection and/or diagnosis of SARS-CoV-2 by FDA under an Emergency Use Authorization (EUA). This EUA will remain  in effect (meaning this test can be used) for the duration of the COVID-19 declaration under Section 564(b)(1) of the Act, 21 U.S.C. section 360bbb-3(b)(1), unless the authorization is terminated or revoked  sooner.     Influenza A by PCR NEGATIVE NEGATIVE Final   Influenza B by PCR NEGATIVE NEGATIVE Final    Comment: (NOTE) The Xpert Xpress SARS-CoV-2/FLU/RSV assay is intended as an aid in  the diagnosis of influenza from Nasopharyngeal swab specimens and  should not be used as a sole basis for treatment. Nasal washings and  aspirates are unacceptable for Xpert Xpress SARS-CoV-2/FLU/RSV  testing.  Fact Sheet for Patients: PinkCheek.be  Fact Sheet for Healthcare Providers: GravelBags.it  This test is not yet approved or cleared by the [place] FDA and  has been authorized for detection and/or diagnosis of SARS-CoV-2 by  FDA under an Emergency Use Authorization (EUA). This EUA will remain  in effect (meaning this test can be used) for the duration of the  Covid-19 declaration under Section 564(b)(1) of the Act, 21  U.S.C. section 360bbb-3(b)(1), unless the authorization is  terminated or revoked. Performed at [facility], [place] [address] [place], [place] [address]        Radiology Studies: No results found. IR GASTROSTOMY TUBE MOD SED  Final Result    DG UGI W SINGLE CM (SOL OR THIN BA)  Final Result    [place] EKG SITE RITE  Final Result    DG Abd 1 View - KUB  Final Result    CT ABDOMEN PELVIS W CONTRAST  Final Result    DG Chest Port 1 View  Final Result      Scheduled Meds: . Chlorhexidine Gluconate Cloth  6 each Topical Daily  . feeding supplement (ENSURE ENLIVE)  237 mL Oral BID BM  . fluticasone furoate-vilanterol  1 puff Inhalation Daily  . heparin  5,000 Units Subcutaneous Q8H  . insulin aspart  0-9 Units Subcutaneous Q4H  . pantoprazole sodium  40 mg Per Tube BID  . sodium chloride flush  10-40 mL Intracatheter Q12H  . sucralfate  1 g Per Tube TID WC & HS  . umeclidinium bromide  1 puff Inhalation Daily   PRN Meds: acetaminophen (TYLENOL) oral liquid 160 mg/5 mL, albuterol, diphenhydrAMINE, ondansetron **OR** ondansetron (ZOFRAN) IV, oxyCODONE, phenol, sodium chloride flush Continuous Infusions: . feeding supplement (OSMOLITE 1.2 CAL) 1,000 mL (04/16/20 0059)      LOS: 9 days  Time spent: Greater than 50% of the 35 minute visit was spent in counseling/coordination of care for the patient as laid out in the A&P.   Dwyane Dee, MD [facility] 04/16/2020, 2:18 PM  Contact via secure chat.  To contact the attending provider between 7A-7P or the covering provider during after hours 7P-7A, please log into the web site [email or web address] and access using universal [place] password for that web site. If you do not have the password, please call the hospital operator.

## 2020-04-17 ENCOUNTER — Telehealth: Payer: Self-pay

## 2020-04-17 DIAGNOSIS — R7303 Prediabetes: Secondary | ICD-10-CM

## 2020-04-17 LAB — BASIC METABOLIC PANEL
Anion gap: 5 (ref 5–15)
BUN: 10 mg/dL (ref 8–23)
CO2: 36 mmol/L — ABNORMAL HIGH (ref 22–32)
Calcium: 8.5 mg/dL — ABNORMAL LOW (ref 8.9–10.3)
Chloride: 96 mmol/L — ABNORMAL LOW (ref 98–111)
Creatinine, Ser: 0.36 mg/dL — ABNORMAL LOW (ref 0.44–1.00)
GFR calc Af Amer: >60 mL/min (ref >60)
GFR calc non Af Amer: >60 mL/min (ref >60)
Glucose, Bld: 129 mg/dL — ABNORMAL HIGH (ref 70–99)
Potassium: 4 mmol/L (ref 3.5–5.1)
Sodium: 137 mmol/L (ref 135–145)

## 2020-04-17 LAB — HEMOGLOBIN A1C
Hgb A1c MFr Bld: 5.9 % — ABNORMAL HIGH (ref 4.8–5.6)
Mean Plasma Glucose: 122.63 mg/dL

## 2020-04-17 LAB — CBC WITH DIFFERENTIAL/PLATELET
Abs Immature Granulocytes: 0.01 10*3/uL (ref 0.00–0.07)
Basophils Absolute: 0 10*3/uL (ref 0.0–0.1)
Basophils Relative: 0 %
Eosinophils Absolute: 0.1 10*3/uL (ref 0.0–0.5)
Eosinophils Relative: 1 %
HCT: 28.5 % — ABNORMAL LOW (ref 36.0–46.0)
Hemoglobin: 9.1 g/dL — ABNORMAL LOW (ref 12.0–15.0)
Immature Granulocytes: 0 %
Lymphocytes Relative: 38 %
Lymphs Abs: 2.2 10*3/uL (ref 0.7–4.0)
MCH: 26.7 pg (ref 26.0–34.0)
MCHC: 31.9 g/dL (ref 30.0–36.0)
MCV: 83.6 fL (ref 80.0–100.0)
Monocytes Absolute: 1.5 10*3/uL — ABNORMAL HIGH (ref 0.1–1.0)
Monocytes Relative: 26 %
Neutro Abs: 2.1 10*3/uL (ref 1.7–7.7)
Neutrophils Relative %: 35 %
Platelets: 268 10*3/uL (ref 150–400)
RBC: 3.41 MIL/uL — ABNORMAL LOW (ref 3.87–5.11)
RDW: 16.2 % — ABNORMAL HIGH (ref 11.5–15.5)
WBC: 6 10*3/uL (ref 4.0–10.5)
nRBC: 0 % (ref 0.0–0.2)

## 2020-04-17 LAB — GLUCOSE, CAPILLARY
Glucose-Capillary: 107 mg/dL — ABNORMAL HIGH (ref 70–99)
Glucose-Capillary: 108 mg/dL — ABNORMAL HIGH (ref 70–99)
Glucose-Capillary: 131 mg/dL — ABNORMAL HIGH (ref 70–99)
Glucose-Capillary: 135 mg/dL — ABNORMAL HIGH (ref 70–99)
Glucose-Capillary: 147 mg/dL — ABNORMAL HIGH (ref 70–99)

## 2020-04-17 LAB — PREALBUMIN: Prealbumin: 11.6 mg/dL — ABNORMAL LOW (ref 18–38)

## 2020-04-17 LAB — MAGNESIUM: Magnesium: 1.6 mg/dL — ABNORMAL LOW (ref 1.7–2.4)

## 2020-04-17 MED ORDER — MAGNESIUM OXIDE 400 (241.3 MG) MG PO TABS
800.0000 mg | ORAL_TABLET | Freq: Once | ORAL | Status: AC
  Administered 2020-04-17: 800 mg
  Filled 2020-04-17: qty 2

## 2020-04-17 MED ORDER — INSULIN ASPART 100 UNIT/ML ~~LOC~~ SOLN: 0.0000 [IU] | SUBCUTANEOUS | Status: DC

## 2020-04-17 NOTE — Telephone Encounter (Signed)
Patient has been scheduled for follow up on 05/02/20

## 2020-04-17 NOTE — Assessment & Plan Note (Signed)
-   A1c 5.9% -Patient developing some hypoglycemia each time insulin has been given while on tube feeds.  May not require much insulin if at all if ongoing hypoglycemia -Sliding scale is adjusted for custom scale and will follow up glucose levels in response to this - continue q4h SSI and CBGs

## 2020-04-17 NOTE — Progress Notes (Signed)
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  General surgery and [facility] consulting.  S/p EGD, esophageal stricture dilatation, placement of NG tube during endoscopy on 9/24.  PICC line placed 9/26, TNA to start 9/27.  S/p UGI series 9/27.    PT Comments    Pt initially tearful during session, unable to verbalize what is making her emotional. Pt tolerates seated exercises with cues for form. Pt agreeable to sidesteps in room with HHA, increased pain limiting ambulation tolerance. Pt able to perform stand pivot transfer to [facility] with single UE support on bed or BSC to steady. Overall, pt requiring significant increased time with mobility due to pain, but no physical assist. Pt cheerful when telling stories about her granddaughter and motivated to get home to daughter and granddaughter. Pt on O2 with SpO2 95% and > during session. RN notified of session and pt requesting pain medication. Patient will benefit from continued physical therapy in hospital and recommendations below to increase strength, balance, endurance for safe ADLs and gait.    Follow Up Recommendations  Home health PT     Equipment Recommendations  None  recommended by PT    Recommendations for Other Services       Precautions / Restrictions Precautions Precautions: Fall Precaution Comments: G-tube/tube feed Restrictions Weight Bearing Restrictions: No    Mobility  Bed Mobility Overal bed mobility: Needs Assistance Bed Mobility: Supine to Sit;Sit to Supine  Supine to sit: Min guard;HOB elevated Sit to supine: Min guard   General bed mobility comments: min G to come to sitting EOB with HOB elevated and use of bedrail to upright trunk, increased time due to painful with movement  Transfers Overall transfer level: Needs assistance Equipment used: None Transfers: Sit to/from Stand Sit to Stand: Min guard Stand pivot transfers: Min guard    General transfer comment: min G to stand with therapist close by for pt comfort, slow to rise up from EOB and return to sitting; min G to transfer over to [facility] and back to bed  Ambulation/Gait Ambulation/Gait assistance: Min guard  Assistive device: 1 person hand held assist  Gait velocity: decreased   General Gait Details: sidesteps in front of bed from head to foot of bed, x2 rounds through, short slow steps with HHA from therapist, increased pain limiting distance   Stairs             Wheelchair Mobility    Modified Rankin (Stroke Patients Only)       Balance Overall balance assessment: Needs assistance;Mild deficits observed, not formally tested Sitting-balance support: Feet supported Sitting balance-Leahy Scale: Fair Sitting balance - Comments: seated EOB   Standing balance support: During functional activity;Single extremity supported Standing balance-Leahy Scale: Fair Standing balance comment: single UE support       Cognition  Arousal/Alertness: Awake/alert Behavior During Therapy: WFL for tasks assessed/performed Overall Cognitive Status: Within Functional Limits for tasks assessed                 Exercises General Exercises - Lower Extremity Long Arc  Quad: Seated;AROM;Strengthening;Both;10 reps Hip Flexion/Marching: Seated;AROM;Strengthening;Both;10 reps    General Comments        Pertinent Vitals/Pain Pain Assessment: Faces Faces Pain Scale: Hurts whole lot Pain Location: abdomen after exercise Pain Descriptors / Indicators: Grimacing;Guarding Pain Intervention(s): Limited activity within patient's tolerance;Monitored during session;Repositioned;Patient requesting pain meds-RN notified    Home Living                      Prior Function            PT Goals (current goals can now be found in the care plan section) Acute Rehab PT Goals Patient Stated Goal: to go back home PT Goal Formulation: With patient Time For Goal Achievement: 04/24/20 Potential to Achieve Goals: Good Progress towards PT goals: Progressing toward goals    Frequency    Min 3X/week      PT Plan Current plan remains appropriate    Co-evaluation              AM-PAC PT "6 Clicks" Mobility   Outcome Measure  Help needed turning from your back to your side while in a flat bed without using bedrails?: A Little Help needed moving from lying on your back to sitting on the side of a flat bed without using bedrails?: A Little Help needed moving to and from a bed to a chair (including a wheelchair)?: A Little Help needed standing up from a chair using your arms (e.g., wheelchair or bedside chair)?: A Little Help needed to walk in hospital room?: A Little Help needed climbing 3-5 steps with a railing? : A Lot 6 Click Score: 17    End of Session Equipment Utilized During Treatment: Oxygen Activity Tolerance: Patient tolerated treatment well;Patient limited by pain Patient left: in bed;with call bell/phone within reach Nurse Communication: Mobility status;Patient requests pain meds PT Visit Diagnosis: Other abnormalities of gait and mobility (R26.89);Muscle weakness (generalized) (M62.81)     Time: 7017-7939 PT Time Calculation (min)  (ACUTE ONLY): 34 min  Charges:  $Therapeutic Exercise: 8-22 mins $Therapeutic Activity: 8-22 mins                      Tori Anyelo Mccue PT, DPT 04/17/20, 2:05 PM

## 2020-04-17 NOTE — Progress Notes (Signed)
PROGRESS NOTE    Claudia Wiley [place]   AJO:[id number]  DOB: [birth year]  DOA: 04/06/2020     10  PCP: [facility]  CC: abd pain  Hospital Course: Ms. 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    General surgery and [facility] consulting.  S/p EGD, esophageal stricture dilatation, placement of NG tube during endoscopy on 9/24.  PICC line placed 9/26, TNA started 9/27. NGT came out overnight of 04/10/20 accidentally and patient was too nervous for replacement but denied any N/V nor abd pain.  After further discussion with GI and surgery, patient will undergo placement of percutaneous gastrostomy tube with eventual conversion to [place] tube.  She also underwent evaluation by SLP and had no obvious oral issues or overt aspiration.  She is of course at increased risk for aspiration due to her longstanding issues with esophageal dilations and strictures.  She was considered appropriate for initiating dysphagia level 2 diet with thin liquids if and when appropriate once procedures are done.  She underwent successful placement of G-tube on 04/13/2020 with interventional radiology. Enteral TF were started on 04/14/20.   Interval History:  Patient apparently having more nausea with increasing tube feed rate.  No vomiting.  Continuing to have adequate flatus and bowel  movements it seems.  Surgery is requesting G-tube converted to [place]. Daughter present this morning and also updated.  Old records reviewed in assessment of this patient  ROS: Constitutional: negative for chills and fevers, Respiratory: negative for cough, Cardiovascular: negative for chest pain and Gastrointestinal: positive for abdominal pain  Assessment & Plan:  * SMAS (superior mesenteric artery syndrome) ([place]) Reportedly started approximately 48 hours after initial Covid vaccine. Initial CT abdomen showed distal esophageal wall thickening, dilated stomach and dilated proximal duodenum to the level of the SMA.  - S/p EGD 9/24, esophageal stricture dilatation, placement of NG tube, showed multiple duodenal ulcers which were biopsied and showed endoscopic appearance consistent with extraluminal compression by the SMA. Treatment of SMA syndrome would be to improve calorie intake and restore fat pad in between SMA and duodenum.  - GI and General surgery input appreciated. No surgical interventions at this time. Continue supportive treatment (NGT displaced on 9/27 but no N/V currently) - Goal is to improve nutrition to restore fat pad in between SMA and duodenum.  - s/p IR placement of PEG on 9/30 with eventual GJ tube conversion if needed/not tolerating G-tube - d/c TPN (stopped 9/30) - continue on enteral TF; appreciate RD consult; per RD patient will need pump to continue continuous TF at discharge  - patient now having more nausea and not tolerating TF; surgery has requested tube converted to [place]  Esophageal stricture Recurrent benign esophageal strictures due to previous acute necrotic esophagus and Candida esophagitis. Requiring multiple EGDs and dilatations. Had EGD and dilatation of strictures again on 9/24. - SLP eval on 9/29; technically okay for dysphagia level  2 thin liquids; continue aspiration precautions  - continue liquid diet orally for now and at discharge; long term  management with GI and possible advancement in the future  Dysphagia - see esophageal stricture - CLD for now  Protein-calorie malnutrition, severe - continue TF - see esophageal stricture   Prediabetes - A1c 5.9% -Patient developing some hypoglycemia each time insulin has been given while on tube feeds.  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 Reportedly having diffuse bodyitching.? Tongue swelling without thrush. Unclear etiology. Unsure if this is an allergic reaction. No new medications started - overall has improved and no further swelling appreciated - d/c benadryl, pepcid, and solu-medrol and monitor   Body mass index is 14.12 kg/m.  Nutritional Status Nutrition Problem: Severe Malnutrition Etiology: chronic illness (COPD, recurrent esophageal strictures) Signs/Symptoms: percent weight loss, energy intake < or equal to 75% for > or equal to 1 month, severe fat depletion, severe muscle depletion Percent weight loss: 22 % (x 9 months) Interventions: Refer to RD note for recommendations  Antimicrobials: none  DVT prophylaxis: HSQ Code Status: Full Family Communication: none Disposition Plan: Status is: Inpatient  Remains inpatient appropriate because:IV treatments appropriate due to intensity of illness or inability to take PO and Inpatient level of care appropriate due to severity of illness   Dispo: The patient is from: Home              Anticipated d/c is to: Home               Anticipated d/c date is: Tentatively for Monday, 10/4              Patient currently is not medically stable to d/c.  Objective: Blood pressure 102/64, pulse 72, temperature 98.3 F (36.8 C), temperature source Oral, resp. rate 16, height 4\' 11"  (1.499 m), weight 31.7 kg, SpO2 100 %.  Examination: General appearance: frail and thin woman with hoarse voice laying in bed in NAD Head: Normocephalic, without obvious abnormality, atraumatic Eyes: EOMI Lungs: clear to auscultation bilaterally Heart: regular rate and rhythm and S1, S2 normal Abdomen: minimal TTP in abdomen; BS present Extremities: thin, no edema Skin: mobility and turgor normal Neurologic: weak but no focal deficits   Consultants:   GI  Surgery  IR  Procedures:  EGD 9/24 NG tube placed during EGD 9/24 PICC line right upper extremity 9/26 IR placed PEG on 9/30  Data Reviewed: I have personally reviewed following labs and imaging studies Results for orders placed or performed during the hospital encounter of 04/06/20 (from the past 24 hour(s))  Glucose, capillary     Status: Abnormal   Collection Time: 04/16/20  3:54 PM  Result Value Ref Range   Glucose-Capillary 64 (L) 70 - 99 mg/dL  Glucose, capillary     Status: Abnormal   Collection Time: 04/16/20  6:25 PM  Result Value Ref Range   Glucose-Capillary 142 (H) 70 - 99 mg/dL  Glucose, capillary     Status: Abnormal   Collection Time: 04/16/20  8:25 PM  Result Value Ref Range   Glucose-Capillary 53 (L) 70 - 99 mg/dL   Comment 1 Notify RN    Comment 2 Document in Chart   Glucose, capillary     Status: Abnormal   Collection Time: 04/16/20  9:44 PM  Result Value Ref Range   Glucose-Capillary 133 (H) 70 - 99 mg/dL  Glucose, capillary     Status: Abnormal   Collection Time: 04/17/20 12:13 AM  Result Value Ref Range   Glucose-Capillary 135 (H) 70 - 99 mg/dL   Comment 1 Notify RN    Comment 2 Document in Chart   Glucose, capillary     Status: Abnormal    Collection Time: 04/17/20  5:01 AM  Result Value Ref Range   Glucose-Capillary 147 (H) 70 - 99 mg/dL   Comment 1 Notify RN    Comment 2 Document in Chart   Basic metabolic panel     Status: Abnormal   Collection Time: 04/17/20  6:07 AM  Result Value Ref Range   Sodium 137 135 - 145 mmol/L   Potassium 4.0 3.5 - 5.1 mmol/L   Chloride 96 (L) 98 - 111 mmol/L   CO2 36 (H) 22 - 32 mmol/L   Glucose, Bld 129 (H) 70 - 99 mg/dL   BUN 10 8 - 23 mg/dL   Creatinine, Ser 0.36 (L) 0.44 - 1.00 mg/dL   Calcium 8.5 (L) 8.9 - 10.3 mg/dL   GFR calc non Af Amer >60 >60 mL/min   GFR calc Af Amer >60 >60 mL/min   Anion gap 5 5 - 15  CBC with Differential/Platelet     Status: Abnormal   Collection Time: 04/17/20  6:07 AM  Result Value Ref Range   WBC 6.0 4.0 - 10.5 K/uL   RBC 3.41 (L) 3.87 - 5.11 MIL/uL   Hemoglobin 9.1 (L) 12.0 - 15.0 g/dL   HCT 28.5 (L) 36 - 46 %   MCV 83.6 80.0 - 100.0 fL   MCH 26.7 26.0 - 34.0 pg   MCHC 31.9 30.0 - 36.0 g/dL   RDW 16.2 (H) 11.5 - 15.5 %   Platelets 268 150 - 400 K/uL   nRBC 0.0 0.0 - 0.2 %   Neutrophils Relative % 35 %   Neutro Abs 2.1 1.7 - 7.7 K/uL   Lymphocytes Relative 38 %   Lymphs Abs 2.2 0.7 - 4.0 K/uL   Monocytes Relative 26 %   Monocytes Absolute 1.5 (H) 0 - 1 K/uL   Eosinophils Relative 1 %   Eosinophils Absolute 0.1 0 - 0 K/uL   Basophils Relative 0 %   Basophils Absolute 0.0 0 - 0 K/uL   Immature Granulocytes 0 %   Abs Immature Granulocytes 0.01 0.00 - 0.07 K/uL  Magnesium     Status: Abnormal   Collection Time: 04/17/20  6:07 AM  Result Value Ref Range   Magnesium 1.6 (L) 1.7 - 2.4 mg/dL  Glucose, capillary     Status: Abnormal   Collection Time: 04/17/20  7:48 AM  Result Value Ref Range   Glucose-Capillary 107 (H) 70 - 99 mg/dL  Prealbumin     Status: Abnormal   Collection Time: 04/17/20  8:27 AM  Result Value Ref Range   Prealbumin 11.6 (L) 18 - 38 mg/dL  Glucose, capillary     Status: Abnormal   Collection Time: 04/17/20 11:08 AM    Result Value Ref Range   Glucose-Capillary 108 (H) 70 - 99 mg/dL  Hemoglobin A1c  Status: Abnormal   Collection Time: 04/17/20 11:56 AM  Result Value Ref Range   Hgb A1c MFr Bld 5.9 (H) 4.8 - 5.6 %   Mean Plasma Glucose 122.63 mg/dL    No results found for this or any previous visit (from the past 240 hour(s)).   Radiology Studies: No results found. IR GASTROSTOMY TUBE MOD SED  Final Result    DG UGI W SINGLE CM (SOL OR THIN BA)  Final Result    [place] EKG SITE RITE  Final Result    DG Abd 1 View - KUB  Final Result    CT ABDOMEN PELVIS W CONTRAST  Final Result    DG Chest Port 1 View  Final Result    IR GASTR TUBE CONVERT GASTR-JEJ PER W/FL MOD SED    (Results Pending)    Scheduled Meds: . Chlorhexidine Gluconate Cloth  6 each Topical Daily  . docusate  100 mg Per Tube Daily  . feeding supplement (ENSURE ENLIVE)  237 mL Oral BID BM  . fluticasone furoate-vilanterol  1 puff Inhalation Daily  . heparin  5,000 Units Subcutaneous Q8H  . insulin aspart  0-4 Units Subcutaneous Q4H  . pantoprazole sodium  40 mg Per Tube BID  . polyethylene glycol  17 g Per Tube Daily  . sodium chloride flush  10-40 mL Intracatheter Q12H  . sucralfate  1 g Per Tube TID WC & HS  . umeclidinium bromide  1 puff Inhalation Daily   PRN Meds: acetaminophen (TYLENOL) oral liquid 160 mg/5 mL, albuterol, diphenhydrAMINE, ondansetron **OR** ondansetron (ZOFRAN) IV, oxyCODONE, phenol, sodium chloride flush Continuous Infusions: . feeding supplement (OSMOLITE 1.2 CAL) 1,000 mL (04/16/20 0059)      LOS: 10 days  Time spent: Greater than 50% of the 35 minute visit was spent in counseling/coordination of care for the patient as laid out in the A&P.   Dwyane Dee, MD [facility] 04/17/2020, 3:16 PM  Contact via secure chat.  To contact the attending provider between 7A-7P or the covering provider during after hours 7P-7A, please log into the web site [email or web address] and access using  universal [place] password for that web site. If you do not have the password, please call the hospital operator.

## 2020-04-17 NOTE — TOC Progression Note (Signed)
Transition of Care [facility]) - Progression Note    Patient Details  Name: Claudia Wiley MRN: [id number] Date of Birth: 10/07/1955  Transition of Care [facility]) CM/SW Contact  Ming Mcmannis, Marjie Skiff, RN Phone Number: 04/17/2020, 3:48 PM  Clinical Narrative:    This CM was contacted by Glean Hess CSW from the [place]. This CM was asked to email tube feed and pump orders to both her and to dietician [employer]. Orders email to both ladies. This CM spoke with pt daughter this afternoon who states that she has gone to pick up the TF pump and plans to go pick up the TF tomorrow. TOC will continue to follow.  Expected Discharge Plan: [place] Barriers to Discharge: Continued Medical Work up  Expected Discharge Plan and Services Expected Discharge Plan: [place]   Discharge Planning Services: CM Consult   Living arrangements for the past 2 months: Apartment                 DME Arranged: Tube feeding, Tube feeding pump DME Agency:  ([facility]) Date DME Agency Contacted: 04/14/20 Time DME Agency Contacted: 1300 Representative spoke with at DME Agency: Ralene Bathe at [place]: PT, [place] Agency:  ([facility]) Date [place]: 04/14/20 Time [place]: 26 Representative spoke with at [place]: [place] ([place]) Interventions    Readmission Risk Interventions Readmission Risk Prevention Plan 04/14/2020 10/05/2019  Transportation Screening Complete Complete  PCP or Specialist Appt within 3-5 Days Complete Complete  HRI or [place] Complete Not Complete  Social Work Consult for [place] Planning/Counseling Complete Not Complete  Palliative Care Screening Not Applicable Not Applicable  Medication Review Press photographer) Complete Complete  Some recent data might be hidden

## 2020-04-17 NOTE — Telephone Encounter (Signed)
-----   Message from [name], MD sent at 04/17/2020  8:49 AM EDT ----- Claudia Wiley, Her nutrition and esophageal strictures have been difficult to manage. I hope the G-tube helps her. Thanks, MS ----- Message ----- From: Milus Banister, MD Sent: 04/15/2020   7:15 AM EDT To: [name], RN, [name], MD  [facility], She needs OV with Dr. Fuller Plan or extender in the next several weeks.  Claudia Wiley, Ended up getting an IR placed G tube in position, she's going to have bolus feeds at home. This should allow good nutrition and hopefully the partial duodenal narrowing from SMA compression will improve as her nutrition improves.  I think she understands that her esophageal stricturing may not ever allow enough oral intake to sustain her nutrition.

## 2020-04-17 NOTE — Progress Notes (Signed)
[facility] Progress Note  10 Days Post-Op  Subjective: Patient denies abdominal pain but reports increase in nausea with increased rate of TF over the weekend. No BM.   Objective: Vital signs in last 24 hours: Temp:  [98.2 F (36.8 C)-99.3 F (37.4 C)] 98.2 F (36.8 C) (10/04 0500) Pulse Rate:  [71-84] 71 (10/04 0500) Resp:  [14-17] 16 (10/04 0500) BP: (102-115)/(65-77) 115/65 (10/04 0500) SpO2:  [99 %-100 %] 100 % (10/04 0805) Last BM Date: 04/12/20  Intake/Output from previous day: 10/03 0701 - 10/04 0700 In: -  Out: 425 [Urine:425] Intake/Output this shift: No intake/output data recorded.  PE: General appearance:alert, chronically ill appearing, NAD NI:OEVO, non-tender, G-tube present with some bleeding around, bowel sounds normal, no palpable masses   Lab Results:  Recent Labs    04/15/20 0404 04/17/20 0607  WBC 7.3 6.0  HGB 11.1* 9.1*  HCT 35.8* 28.5*  PLT 239 268   BMET Recent Labs    04/15/20 0404 04/17/20 0607  NA 132* 137  K 4.4 4.0  CL 90* 96*  CO2 36* 36*  GLUCOSE 123* 129*  BUN 11 10  CREATININE 0.41* 0.36*  CALCIUM 8.9 8.5*   PT/INR No results for input(s): LABPROT, INR in the last 72 hours. CMP     Component Value Date/Time   NA 137 04/17/2020 0607   K 4.0 04/17/2020 0607   CL 96 (L) 04/17/2020 0607   CO2 36 (H) 04/17/2020 0607   GLUCOSE 129 (H) 04/17/2020 0607   BUN 10 04/17/2020 0607   CREATININE 0.36 (L) 04/17/2020 0607   CALCIUM 8.5 (L) 04/17/2020 0607   PROT 5.7 (L) 04/13/2020 0359   ALBUMIN 3.5 04/13/2020 0359   AST 38 04/13/2020 0359   ALT 46 (H) 04/13/2020 0359   ALKPHOS 45 04/13/2020 0359   BILITOT 0.3 04/13/2020 0359   GFRNONAA >60 04/17/2020 0607   GFRAA >60 04/17/2020 0607   Lipase     Component Value Date/Time   LIPASE 21 10/01/2019 1644       Studies/Results: No results found.  Anti-infectives: Anti-infectives (From admission, onward)   Start     Dose/Rate Route Frequency Ordered  Stop   04/13/20 1418  ceFAZolin (ANCEF) 2-4 GM/100ML-% IVPB       Note to Pharmacy: Roe Coombs   : cabinet override      04/13/20 1418 04/13/20 1512   04/13/20 1330  ceFAZolin (ANCEF) IVPB 2g/100 mL premix        2 g 200 mL/hr over 30 Minutes Intravenous To Radiology 04/12/20 1157 04/13/20 1500       Assessment/Plan Hx gastrinoma S/P surgical resection 2014/2015 -No anatomic evidence or medical record of Whipple procedure.On 02/11/2014 at the [facility] in [place], [place], by Dr. 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 They could not determine whether the neuroendocrine tumor was metastatic.No pancreatic tissue mentioned on path report. 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 ?SMA compression. Contrast advances into the 3rd and 4th portions with delay, in the supine position.  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 -Followed by [facility] with multiple esophageal dilatations for chronic esophageal stricture;DilatedFriday 9/24 -Dr. Bryan Lemma Severe malnutrition-BMI 13.67/prealbumin 6.3>10.2 (9/28) - repeat today - s/p IR G-tube placement 9/30, I think will likely need to convert to [place] tube since patient nauseated with increased TF - will discuss with attending and IR today   FEN:CLD, TF @50  cc/h ID: None DVT: SCDs, SQ heparin Follow-up: TBD  LOS: 10 days    Norm Parcel ,  [facility] 04/17/2020, 8:21 AM Please see Amion for pager number during day hours 7:00am-4:30pm

## 2020-04-18 ENCOUNTER — Inpatient Hospital Stay (HOSPITAL_COMMUNITY): Payer: No Typology Code available for payment source

## 2020-04-18 HISTORY — PX: IR GASTR TUBE CONVERT GASTR-JEJ PER W/FL MOD SED: IMG2332

## 2020-04-18 LAB — CBC WITH DIFFERENTIAL/PLATELET
Abs Immature Granulocytes: 0.01 10*3/uL (ref 0.00–0.07)
Basophils Absolute: 0 10*3/uL (ref 0.0–0.1)
Basophils Relative: 0 %
Eosinophils Absolute: 0.1 10*3/uL (ref 0.0–0.5)
Eosinophils Relative: 2 %
HCT: 31.2 % — ABNORMAL LOW (ref 36.0–46.0)
Hemoglobin: 9.6 g/dL — ABNORMAL LOW (ref 12.0–15.0)
Immature Granulocytes: 0 %
Lymphocytes Relative: 41 %
Lymphs Abs: 3 10*3/uL (ref 0.7–4.0)
MCH: 26.6 pg (ref 26.0–34.0)
MCHC: 30.8 g/dL (ref 30.0–36.0)
MCV: 86.4 fL (ref 80.0–100.0)
Monocytes Absolute: 1.8 10*3/uL — ABNORMAL HIGH (ref 0.1–1.0)
Monocytes Relative: 25 %
Neutro Abs: 2.3 10*3/uL (ref 1.7–7.7)
Neutrophils Relative %: 32 %
Platelets: 283 10*3/uL (ref 150–400)
RBC: 3.61 MIL/uL — ABNORMAL LOW (ref 3.87–5.11)
RDW: 16.6 % — ABNORMAL HIGH (ref 11.5–15.5)
WBC: 7.2 10*3/uL (ref 4.0–10.5)
nRBC: 0 % (ref 0.0–0.2)

## 2020-04-18 LAB — GLUCOSE, CAPILLARY
Glucose-Capillary: 102 mg/dL — ABNORMAL HIGH (ref 70–99)
Glucose-Capillary: 123 mg/dL — ABNORMAL HIGH (ref 70–99)
Glucose-Capillary: 115 mg/dL — ABNORMAL HIGH (ref 70–99)
Glucose-Capillary: 101 mg/dL — ABNORMAL HIGH (ref 70–99)
Glucose-Capillary: 116 mg/dL — ABNORMAL HIGH (ref 70–99)
Glucose-Capillary: 117 mg/dL — ABNORMAL HIGH (ref 70–99)
Glucose-Capillary: 123 mg/dL — ABNORMAL HIGH (ref 70–99)

## 2020-04-18 LAB — BASIC METABOLIC PANEL
Anion gap: 5 (ref 5–15)
BUN: 8 mg/dL (ref 8–23)
CO2: 38 mmol/L — ABNORMAL HIGH (ref 22–32)
Calcium: 9.3 mg/dL (ref 8.9–10.3)
Chloride: 94 mmol/L — ABNORMAL LOW (ref 98–111)
Creatinine, Ser: 0.39 mg/dL — ABNORMAL LOW (ref 0.44–1.00)
GFR calc Af Amer: >60 mL/min (ref >60)
GFR calc non Af Amer: >60 mL/min (ref >60)
Glucose, Bld: 123 mg/dL — ABNORMAL HIGH (ref 70–99)
Potassium: 4.7 mmol/L (ref 3.5–5.1)
Sodium: 137 mmol/L (ref 135–145)

## 2020-04-18 LAB — MAGNESIUM: Magnesium: 1.7 mg/dL (ref 1.7–2.4)

## 2020-04-18 MED ORDER — PROCHLORPERAZINE EDISYLATE 10 MG/2ML IJ SOLN
10.0000 mg | INTRAMUSCULAR | Status: DC | PRN
  Administered 2020-04-18: 10 mg via INTRAVENOUS
  Filled 2020-04-18: qty 2

## 2020-04-18 MED ORDER — HYDROXYZINE HCL 10 MG/5ML PO SYRP
10.0000 mg | ORAL_SOLUTION | Freq: Three times a day (TID) | ORAL | Status: DC | PRN
  Filled 2020-04-18 (×2): qty 5

## 2020-04-18 MED ORDER — IOHEXOL 300 MG/ML  SOLN
50.0000 mL | Freq: Once | INTRAMUSCULAR | Status: AC | PRN
  Administered 2020-04-18: 40 mL

## 2020-04-18 NOTE — Procedures (Signed)
Interventional Radiology Procedure Note  Procedure: G to [place] conversion  Complications: None  Estimated Blood Loss: None  Recommendations: - J-arm added - Ready for use    Signed,  Criselda Peaches, MD

## 2020-04-18 NOTE — Progress Notes (Signed)
[facility] Progress Note  11 Days Post-Op  Subjective: Patient tolerating PO liquids. Tolerating TF but still with some nausea. +flatus, no BM.  Objective: Vital signs in last 24 hours: Temp:  [98 F (36.7 C)-98.4 F (36.9 C)] 98 F (36.7 C) (10/05 0459) Pulse Rate:  [71-75] 71 (10/05 0459) Resp:  [16-20] 20 (10/05 0459) BP: (97-102)/(64-68) 101/65 (10/05 0459) SpO2:  [99 %-100 %] 99 % (10/05 0459) Last BM Date: 04/12/20  Intake/Output from previous day: 10/04 0701 - 10/05 0700 In: 20 [P.O.:20] Out: 300 [Urine:300] Intake/Output this shift: No intake/output data recorded.  PE: General appearance:alert, chronically ill appearing, NAD [place]:OVZC, non-tender, G-tube present with some bleeding around,bowel sounds normal, no palpable masses   Lab Results:  Recent Labs    04/17/20 0607 04/18/20 0412  WBC 6.0 7.2  HGB 9.1* 9.6*  HCT 28.5* 31.2*  PLT 268 283   BMET Recent Labs    04/17/20 0607 04/18/20 0412  NA 137 137  K 4.0 4.7  CL 96* 94*  CO2 36* 38*  GLUCOSE 129* 123*  BUN 10 8  CREATININE 0.36* 0.39*  CALCIUM 8.5* 9.3   PT/INR No results for input(s): LABPROT, INR in the last 72 hours. CMP     Component Value Date/Time   NA 137 04/18/2020 0412   K 4.7 04/18/2020 0412   CL 94 (L) 04/18/2020 0412   CO2 38 (H) 04/18/2020 0412   GLUCOSE 123 (H) 04/18/2020 0412   BUN 8 04/18/2020 0412   CREATININE 0.39 (L) 04/18/2020 0412   CALCIUM 9.3 04/18/2020 0412   PROT 5.7 (L) 04/13/2020 0359   ALBUMIN 3.5 04/13/2020 0359   AST 38 04/13/2020 0359   ALT 46 (H) 04/13/2020 0359   ALKPHOS 45 04/13/2020 0359   BILITOT 0.3 04/13/2020 0359   GFRNONAA >60 04/18/2020 0412   GFRAA >60 04/18/2020 0412   Lipase     Component Value Date/Time   LIPASE 21 10/01/2019 1644       Studies/Results: No results found.  Anti-infectives: Anti-infectives (From admission, onward)   Start     Dose/Rate Route Frequency Ordered Stop   04/13/20 1418   ceFAZolin (ANCEF) 2-4 GM/100ML-% IVPB       Note to Pharmacy: Roe Coombs   : cabinet override      04/13/20 1418 04/13/20 1512   04/13/20 1330  ceFAZolin (ANCEF) IVPB 2g/100 mL premix        2 g 200 mL/hr over 30 Minutes Intravenous To Radiology 04/12/20 1157 04/13/20 1500       Assessment/Plan Hx gastrinoma S/P surgical resection 2014/2015 -No anatomic evidence or medical record of Whipple procedure.On 02/11/2014 at the [facility] in [place], [place], by Dr. 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 They could not determine whether the neuroendocrine tumor was metastatic.No pancreatic tissue mentioned on path report. 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 ?SMA compression. Contrast advances into the 3rd and 4th portions with delay, in the supine position.  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 -Followed by [facility]  with multiple esophageal dilatations for chronic esophageal stricture;DilatedFriday 9/24 -Dr. Bryan Lemma Severe malnutrition-BMI 13.67/prealbumin 6.3>10.2 (9/28) - repeat today - s/p IR G-tube placement 9/30, planning to convert G-tube to G-J today   FEN:CLD, TF @50  cc/h ID: None DVT: SCDs, SQ heparin Follow-up: TBD  LOS: 11 days    Norm Parcel , [facility] 04/18/2020, 8:20 AM Please see Amion for pager number during day hours  7:00am-4:30pm

## 2020-04-18 NOTE — Consult Note (Signed)
Chief Complaint: Dysphagia with history of GERD and inability to tolerate gastrostomy tube feeds. Request is for G to [place] conversion   Referring Physician(s): Wilmer Floor PA  Supervising Physician: Jacqulynn Cadet  Patient Status: [facility] - In-pt  History of Present Illness: Claudia Wiley is a 64 y.o. female 64 y.o. female inpatient. History of arthritis, anemia, lung cancer s.p Left upper lobe lobectomy in 2015, multiple esophageal strictures. 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 Admitted to L for nausea vomiting and dysphagia. 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 IR placed a 24 Fr. Push in on 9.30.21. Patient reporting worsening nausea and is  not able to tolerate tube feeds. Patient present for g to G conversion.   Past Medical History:  Diagnosis Date  . Anemia   . Arthritis   . Cancer [facility]) 2013   Upper left lung and stomach cancer  . COPD (chronic obstructive pulmonary disease) ([place])   . Esophageal stricture   . Family history of brain cancer   . Family history of melanoma   . Gastrinoma   . Gastrinoma   . GERD (gastroesophageal reflux disease)   . Hypertension   . Osteoporosis   . Pancreatic insufficiency   . Pneumonia   . PONV (postoperative nausea and vomiting)   . Vitamin D deficiency   . Weight loss, unintentional     Past Surgical History:  Procedure Laterality Date  . ABDOMINAL HYSTERECTOMY    . BIOPSY  09/11/2019   Procedure: BIOPSY;  Surgeon: Lavena Bullion, DO;  Location: [facility];  Service: Gastroenterology;;  . BIOPSY  10/03/2019   Procedure: BIOPSY;  Surgeon: Jerene Bears, MD;  Location: [facility];  Service: Gastroenterology;;  . BIOPSY  04/07/2020   Procedure: BIOPSY;  Surgeon: Lavena Bullion, DO;  Location: [facility];  Service: Gastroenterology;;  . BREAST LUMPECTOMY    . CESAREAN SECTION    . ESOPHAGEAL DILATION   03/13/2020   Procedure: ESOPHAGEAL DILATION;  Surgeon: [name], MD;  Location: [facility];  Service: Endoscopy;;  . ESOPHAGOGASTRODUODENOSCOPY (EGD) WITH PROPOFOL N/A 09/11/2019   Procedure: ESOPHAGOGASTRODUODENOSCOPY (EGD) WITH PROPOFOL;  Surgeon: Lavena Bullion, DO;  [facility];  Service: Gastroenterology;  Laterality: N/A;  . ESOPHAGOGASTRODUODENOSCOPY (EGD) WITH PROPOFOL N/A 10/03/2019   Procedure: ESOPHAGOGASTRODUODENOSCOPY (EGD) WITH PROPOFOL;  Surgeon: Jerene Bears, MD;  [facility];  Service: Gastroenterology;  Laterality: N/A;  . ESOPHAGOGASTRODUODENOSCOPY (EGD) WITH PROPOFOL N/A 10/19/2019   Procedure: ESOPHAGOGASTRODUODENOSCOPY (EGD) WITH PROPOFOL;  Surgeon: Yetta Flock, MD;  [facility];  Service: Gastroenterology;  Laterality: N/A;  possible dilation  . ESOPHAGOGASTRODUODENOSCOPY (EGD) WITH PROPOFOL N/A 11/02/2019   Procedure: ESOPHAGOGASTRODUODENOSCOPY (EGD) WITH FLUORO AND  WITH PROPOFOL;  Surgeon: [name], MD;  [facility];  Service: Endoscopy;  Laterality: N/A;  need fluoro  . ESOPHAGOGASTRODUODENOSCOPY (EGD) WITH PROPOFOL N/A 11/16/2019   Procedure: ESOPHAGOGASTRODUODENOSCOPY (EGD) WITH PROPOFOL;  Surgeon: [name], MD;  [facility];  Service: Endoscopy;  Laterality: N/A;  . ESOPHAGOGASTRODUODENOSCOPY (EGD) WITH PROPOFOL N/A 11/29/2019   Procedure: ESOPHAGOGASTRODUODENOSCOPY (EGD) WITH PROPOFOL;  Surgeon: [name], MD;  [facility];  Service: Endoscopy;  Laterality: N/A;  need fluoro  . ESOPHAGOGASTRODUODENOSCOPY (EGD) WITH PROPOFOL N/A 12/21/2019   Procedure: ESOPHAGOGASTRODUODENOSCOPY (EGD) WITH PROPOFOL;  Surgeon: [name], MD;  [facility];  Service: Endoscopy;  Laterality: N/A;  . ESOPHAGOGASTRODUODENOSCOPY (EGD) WITH PROPOFOL  N/A 02/07/2020   Procedure: ESOPHAGOGASTRODUODENOSCOPY (EGD) WITH PROPOFOL;  Surgeon: [name], MD;  [facility];   Service: Endoscopy;  Laterality: N/A;  . ESOPHAGOGASTRODUODENOSCOPY (EGD) WITH PROPOFOL N/A 03/13/2020   Procedure: ESOPHAGOGASTRODUODENOSCOPY (EGD) WITH PROPOFOL;  Surgeon: [name], MD;  [facility];  Service: Endoscopy;  Laterality: N/A;  With dilation  . ESOPHAGOGASTRODUODENOSCOPY (EGD) WITH PROPOFOL N/A 04/07/2020   Procedure: ESOPHAGOGASTRODUODENOSCOPY (EGD) WITH PROPOFOL;  Surgeon: Lavena Bullion, DO;  [facility];  Service: Gastroenterology;  Laterality: N/A;  . IR GASTROSTOMY TUBE MOD SED  04/13/2020  . LUNG SURGERY    . SAVORY DILATION N/A 10/19/2019   Procedure: SAVORY DILATION;  Surgeon: Yetta Flock, MD;  [facility];  Service: Gastroenterology;  Laterality: N/A;  . SAVORY DILATION N/A 11/02/2019   Procedure: SAVORY DILATION;  Surgeon: [name], MD;  [facility];  Service: Endoscopy;  Laterality: N/A;  fluoro  . SAVORY DILATION N/A 11/16/2019   Procedure: SAVORY DILATION;  Surgeon: [name], MD;  [facility];  Service: Endoscopy;  Laterality: N/A;  . SAVORY DILATION N/A 11/29/2019   Procedure: SAVORY DILATION;  Surgeon: [name], MD;  [facility];  Service: Endoscopy;  Laterality: N/A;  . SAVORY DILATION N/A 12/21/2019   Procedure: SAVORY DILATION;  Surgeon: [name], MD;  [facility];  Service: Endoscopy;  Laterality: N/A;  . SAVORY DILATION N/A 02/07/2020   Procedure: SAVORY DILATION;  Surgeon: [name], MD;  [facility];  Service: Endoscopy;  Laterality: N/A;    Allergies: Azithromycin, Boniva [ibandronic acid], and Gabapentin  Medications: Prior to Admission medications   Medication Sig Start Date End Date Taking? Authorizing Provider  albuterol (PROVENTIL HFA;VENTOLIN HFA) 108 (90 Base) MCG/ACT inhaler Inhale 2 puffs into the lungs every 6 (six) hours as needed for wheezing or shortness of breath.    Yes [provider]  albuterol  (PROVENTIL) (2.5 MG/3ML) 0.083% nebulizer solution Take 2.5 mg by nebulization every 6 (six) hours as needed for wheezing or shortness of breath.   Yes [provider]  budesonide-formoterol (SYMBICORT) 160-4.5 MCG/ACT inhaler Inhale 1 puff into the lungs daily. Patient taking differently: Inhale 1 puff into the lungs in the morning and at bedtime.  12/21/19   [name], MD  Calcium Carbonate Antacid (CALCIUM CARBONATE, DOSED IN MG ELEMENTAL CALCIUM,) 1250 MG/5ML SUSP Take 1,250 mg of elemental calcium by mouth daily.    [provider]  Cholecalciferol (VITAMIN D3) LIQD Take 400 Units by mouth daily.    [provider]  ENSURE (ENSURE) Take 237 mLs by mouth 3 (three) times daily between meals.     [provider]  fluticasone (FLONASE) 50 MCG/ACT nasal spray Place 1 spray into both nostrils daily as needed for allergies or rhinitis.    [provider]  mirtazapine (REMERON) 15 MG tablet Take 15 mg by mouth at bedtime. *Crushes Tablet*    [provider]  omeprazole (PRILOSEC) 40 MG capsule Take 40 mg by mouth in the morning and at bedtime.    [provider]  pantoprazole (PROTONIX) 40 MG tablet Take 1 tablet (40 mg total) by mouth 2 (two) times daily before a meal. Patient not taking: Reported on 01/26/2020 11/29/19   [name], MD  pregabalin (LYRICA) 150 MG capsule Take 150 mg by mouth at bedtime. Open capsule    [provider]  sucralfate (CARAFATE) 1 GM/10ML suspension Take 1  g by mouth in the morning, at noon, in the evening, and at bedtime.    [provider]  Tiotropium Bromide Monohydrate 2.5 MCG/ACT AERS Inhale 2 puffs into the lungs daily.     [provider]     Family History  Problem Relation Age of Onset  . Diabetes Sister        x 3  . Cancer Sister 32       brain that metastized to bone, stomach and other organs  . Melanoma Maternal Aunt   . Heart attack Father   . Pneumonia  Brother   . Cancer Other 17       cancer in his chest - nephew  . Colon cancer Neg Hx   . Pancreatic cancer Neg Hx   . Rectal cancer Neg Hx   . Esophageal cancer Neg Hx     Social History   Socioeconomic History  . Marital status: Single    Spouse name: Not on file  . Number of children: Not on file  . Years of education: Not on file  . Highest education level: Not on file  Occupational History  . Not on file  Tobacco Use  . Smoking status: Former Smoker    Packs/day: 0.25    Years: 2.00    Pack years: 0.50    Types: Cigarettes    Quit date: 05/30/2019    Years since quitting: 0.8  . Smokeless tobacco: Never Used  . Tobacco comment: 2 months- quit date  Vaping Use  . Vaping Use: Never used  Substance and Sexual Activity  . Alcohol use: No  . Drug use: No  . Sexual activity: Never  Other Topics Concern  . Not on file  Social History Narrative  . Not on file   Social Determinants of Health   Financial Resource Strain:   . Difficulty of Paying Living Expenses: Not on file  Food Insecurity:   . Worried About Charity fundraiser in the Last Year: Not on file  . Ran Out of Food in the Last Year: Not on file  Transportation Needs:   . Lack of Transportation (Medical): Not on file  . Lack of Transportation (Non-Medical): Not on file  Physical Activity:   . Days of Exercise per Week: Not on file  . Minutes of Exercise per Session: Not on file  Stress:   . Feeling of Stress : Not on file  Social Connections:   . Frequency of Communication with Friends and Family: Not on file  . Frequency of Social Gatherings with Friends and Family: Not on file  . Attends Religious Services: Not on file  . Active Member of Clubs or Organizations: Not on file  . Attends Archivist Meetings: Not on file  . Marital Status: Not on file     Review of Systems: A 12 point ROS discussed and pertinent positives are indicated in the HPI above.  All other systems are  negative.  Review of Systems  Constitutional: Negative for fatigue and fever.  HENT: Negative for congestion.   Respiratory: Negative for cough and shortness of breath.   Gastrointestinal: Negative for abdominal pain, diarrhea, nausea and vomiting.    Vital Signs: BP 101/65 (BP Location: Left Arm)   Pulse 71   Temp 98 F (36.7 C) (Oral)   Resp 20   Ht 4\' 11"  (1.499 m)   Wt 69 lb 14.2 oz (31.7 kg)   SpO2 99%   BMI 14.12  kg/m   Physical Exam Vitals and nursing note reviewed.  Constitutional:      Appearance: She is well-developed.  HENT:     Head: Normocephalic and atraumatic.  Eyes:     Conjunctiva/sclera: Conjunctivae normal.  Pulmonary:     Effort: Pulmonary effort is normal.  Musculoskeletal:        General: Normal range of motion.     Cervical back: Normal range of motion.  Skin:    General: Skin is warm.  Neurological:     Mental Status: She is alert and oriented to person, place, and time.     Imaging: DG Abd 1 View - KUB  Result Date: 04/07/2020 CLINICAL DATA:  NG tube placement EXAM: ABDOMEN - 1 VIEW COMPARISON:  04/03/2019 FINDINGS: The enteric tube courses through the gastric body and terminates near the pylorus/antrum. The bowel gas pattern is nonspecific. IMPRESSION: Enteric tube projects near the pylorus/antrum. Electronically Signed   By: Constance Holster M.D.   On: 04/07/2020 19:55   CT ABDOMEN PELVIS W CONTRAST  Result Date: 04/06/2020 CLINICAL DATA:  Abdominal pain EXAM: CT ABDOMEN AND PELVIS WITH CONTRAST TECHNIQUE: Multidetector CT imaging of the abdomen and pelvis was performed using the standard protocol following bolus administration of intravenous contrast. CONTRAST:  33mL OMNIPAQUE IOHEXOL 300 MG/ML  SOLN COMPARISON:  09/09/2019 FINDINGS: Lower chest: The lung bases are clear. The heart size is normal. Hepatobiliary: The liver is normal. Normal gallbladder.There is no biliary ductal dilation. Pancreas: Normal contours without ductal dilatation.  No peripancreatic fluid collection. Spleen: Unremarkable. Adrenals/Urinary Tract: --Adrenal glands: Unremarkable. --Right kidney/ureter: No hydronephrosis or radiopaque kidney stones. --Left kidney/ureter: No hydronephrosis or radiopaque kidney stones. --Urinary bladder: The urinary bladder is distended. Stomach/Bowel: --Stomach/Duodenum: There is esophageal wall thickening involving the distal esophagus. The stomach is distended. The proximal duodenum is distended to the level of the SMA. --Small bowel: There is no evidence for small bowel obstruction. --Colon: Unremarkable. --Appendix: Normal. Vascular/Lymphatic: Atherosclerotic calcification is present within the non-aneurysmal abdominal aorta, without hemodynamically significant stenosis. --No retroperitoneal lymphadenopathy. --No mesenteric lymphadenopathy. --No pelvic or inguinal lymphadenopathy. Reproductive: Status post hysterectomy. No adnexal mass. Other: No ascites or free air. There has been significant interval loss of subcutaneous fat since the patient's prior CT. Musculoskeletal. No acute displaced fractures. IMPRESSION: 1. Findings suggestive of developing SMA syndrome as detailed above. 2. Distended urinary bladder. Aortic Atherosclerosis (ICD10-I70.0). Electronically Signed   By: Constance Holster M.D.   On: 04/06/2020 23:13   IR GASTROSTOMY TUBE MOD SED  Result Date: 04/13/2020 INDICATION: 64 year old female with history of multiple esophageal strictures requiring percutaneous enteric access for nutritional supplementation. Plan for eventual J arm placement. EXAM: GASTROSTOMY TUBE PLACEMENT COMPARISON:  CT abdomen pelvis from 04/06/2020 MEDICATIONS: Ancef 2 gm IV; Antibiotics were administered within 1 hour of the procedure. CONTRAST:  Twenty mL of Omnipaque 300 administered into the gastric lumen. ANESTHESIA/SEDATION: Moderate (conscious) sedation was employed during this procedure. A total of Versed 2 mg and Fentanyl 100 mcg was  administered intravenously. Moderate Sedation Time: 32 minutes. The patient's level of consciousness and vital signs were monitored continuously by radiology nursing throughout the procedure under my direct supervision. FLUOROSCOPY TIME:  Three minutes 18 seconds (6 mGy) COMPLICATIONS: None immediate. PROCEDURE: Informed written consent was obtained from the patient following explanation of the procedure, risks, benefits and alternatives. A time out was performed prior to the initiation of the procedure. Ultrasound scanning was performed to demarcate the edge of the left lobe of the liver. Maximal  barrier sterile technique utilized including caps, mask, sterile gowns, sterile gloves, large sterile drape, hand hygiene and Betadine prep. Review of pre-procedure CT scan demonstrates an adequate window for percutaneous placement of a gastrostomy tube. The patient was placed on the procedure table in the supine position. Pre-procedure abdominal film confirmed visualization of the transverse colon. An angled 5-French catheter was passed through the nares into the stomach. The patient was prepped and draped in usual sterile fashion. The stomach was insufflated with air via the indwelling nasogastric tube. Under fluoroscopy, a puncture site was selected and local analgesia achieved with 1% lidocaine infiltrated subcutaneously. Under fluoroscopic guidance, a gastropexy needle was passed into the stomach and the T-bar suture was released. Entry into the stomach was confirmed with fluoroscopy, aspiration of air, and injection of contrast material. This was repeated with an additional gastropexy suture (for a total of 2 fasteners). At the center of these gastropexy sutures, a dermatotomy was performed. An 18 gauge needle was passed into the stomach at the site of this dermatotomy, and position within the gastric lumen again confirmed under fluoroscopy using aspiration of air and contrast injection. An Amplatz guidewire was  passed through this needle and intraluminal placement within the stomach was confirmed by fluoroscopy. The needle was removed. Over the guidewire, the percutaneous tract was dilated using a 10 mm x 4 cm semi-compliant angioplasty balloon, which was quickly deflated followed by passage of a 24 French percutaneous gastrostomy tube. The retention balloon of the percutaneous gastrostomy tube was inflated with 20 mL of sterile water with a small amount of contrast material. The tube was withdrawn until the retention balloon was at the edge of the gastric lumen. The external bumper was brought to the abdominal wall. Contrast was injected through the gastrostomy tube, confirming intraluminal positioning. The patient tolerated the procedure well without any immediate post-procedural complications. FINDINGS: After successful fluoroscopic guided placement, the gastrostomy tube is appropriately positioned with internal retention balloon against the ventral aspect of the gastric lumen. IMPRESSION: Successful fluoroscopic insertion of a 24 French balloon retention gastrostomy tube. Ruthann Cancer, MD [facility] Electronically Signed   By: Ruthann Cancer MD   On: 04/13/2020 17:12   DG Chest Port 1 View  Result Date: 04/06/2020 CLINICAL DATA:  Shortness of breath, sepsis EXAM: PORTABLE CHEST 1 VIEW COMPARISON:  Chest x-ray 10/03/2019 chest x-ray 10/01/2019 FINDINGS: The heart size and mediastinal contours are within normal limits. Hyperinflation of the lungs suggestive of emphysema. Pulmonary sutures overlie the left hemithorax. Biapical pleural/pulmonary scarring again noted. No focal consolidation. No pulmonary edema. Blunting of the left costophrenic angle is again noted which may represent scarring versus trace pleural effusion. No pneumothorax. Well-defined symmetric bilateral lower/mid lung zone densities consistent with nipple shadows. No acute osseous  abnormality. IMPRESSION: No active cardiopulmonary disease. Electronically Signed   By: Iven Finn M.D.   On: 04/06/2020 21:53   DG UGI W SINGLE CM (SOL OR THIN BA)  Result Date: 04/10/2020 CLINICAL DATA:  Duodenal stenosis. EXAM: UPPER GI SERIES WITH KUB TECHNIQUE: After obtaining a scout radiograph a routine upper GI series was performed using water-soluble contrast. FLUOROSCOPY TIME:  Fluoroscopy Time:  1 minutes, 48 seconds Radiation Exposure Index (if provided by the fluoroscopic device): 13.20 mGy Number of Acquired Spot Images: 3 COMPARISON:  Abdominal radiographs 04/07/2020. CT abdomen/pelvis 04/06/2020. FINDINGS: A scout radiograph of the abdomen demonstrates a post-pyloric enteric tube. No dilated loops of bowel within the visualized abdomen. Surgical clips within  the right lower quadrant. No acute bony abnormality. A problem-oriented water-soluble single contrast upper GI series was performed by injecting the enteric tube. There is contrast opacification of the proximal duodenum. There is narrowing of the third portion of the duodenum at the level of the SMA. However, contrast advances to the fourth portion of the duodenum without significant delay. The duodenal sweep is otherwise unremarkable. IMPRESSION: Problem-oriented water-soluble single contrast upper GI series as described. There is narrowing of the third portion of the duodenum at the level of the SMA, which may reflect a component of SMA compression. However, contrast advances from the 3rd to 4th portion of the duodenum without significant delay with the patient in the supine position. The duodenal sweep is otherwise unremarkable. Electronically Signed   By: Kellie Simmering DO   On: 04/10/2020 09:52   [place] EKG SITE RITE  Result Date: 04/09/2020 If Site Rite image not attached, placement could not be confirmed due to current cardiac rhythm.   Labs:  CBC: Recent Labs    04/14/20 0602 04/15/20 0404 04/17/20 0607 04/18/20 0412   WBC 8.9 7.3 6.0 7.2  HGB 11.5* 11.1* 9.1* 9.6*  HCT 37.1 35.8* 28.5* 31.2*  PLT 220 239 268 283    COAGS: Recent Labs    04/06/20 2130  INR 1.1  APTT 33    BMP: Recent Labs    04/14/20 0602 04/15/20 0404 04/17/20 0607 04/18/20 0412  NA 136 132* 137 137  K 4.6 4.4 4.0 4.7  CL 91* 90* 96* 94*  CO2 38* 36* 36* 38*  GLUCOSE 80 123* 129* 123*  BUN 10 11 10 8   CALCIUM 8.9 8.9 8.5* 9.3  CREATININE 0.37* 0.41* 0.36* 0.39*  GFRNONAA >60 >60 >60 >60  GFRAA >60 >60 >60 >60    LIVER FUNCTION TESTS: Recent Labs    04/10/20 1005 04/11/20 0757 04/12/20 0808 04/13/20 0359  BILITOT 0.1* 0.1* 0.4 0.3  AST 20 21 51* 38  ALT 12 13 33 46*  ALKPHOS 42 41 40 45  PROT 5.5* 5.9* 5.3* 5.7*  ALBUMIN 3.1* 3.4* 3.0* 3.5     Assessment and Plan:  64 y.o. female inpatient. History of arthritis, anemia, lung cancer s.p Left upper lobe lobectomy in 2015, multiple esophageal strictures. 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 Admitted to L for nausea vomiting and dysphagia. 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 IR placed a 24 Fr. Push in on 9.30.21. Patient reporting worsening nausea and is  not able to tolerate tube feeds. Patient present for g to G conversion.    All labs are within acceptable parameters. Patient is on heparin prophylactic dosage.    Risks and benefits image guided gastrostomy tube to gastrostomy jejunostomy tube conversion  placement was discussed with the patient including, but not limited to the need for a barium enema during the procedure, bleeding, infection, peritonitis and/or damage to adjacent structures.  All of the patient's questions were answered, patient is agreeable to proceed.  Consent signed and in chart.    Thank you for this interesting consult.  I greatly enjoyed meeting Claudia Wiley and look forward to participating in their care.  A  copy of this report was sent to the requesting provider on this date.  Electronically Signed: Jacqualine Mau, NP 04/18/2020, 8:16 AM   I spent a total of 20 Minutes    in face to face in clinical consultation, greater than  50% of which was counseling/coordinating care for G to [place] conversion.

## 2020-04-18 NOTE — Progress Notes (Signed)
PROGRESS NOTE    Jamieson [address]   NID:[id number]  DOB: September 25, 1955  DOA: 04/06/2020     11  PCP: [facility]  CC: abd pain  Hospital Course: Ms. 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    General surgery and [facility] consulting.  S/p EGD, esophageal stricture dilatation, placement of NG tube during endoscopy on 9/24.  PICC line placed 9/26, TNA started 9/27. NGT came out overnight of 04/10/20 accidentally and patient was too nervous for replacement but denied any N/V nor abd pain.  After further discussion with GI and surgery, patient will undergo placement of percutaneous gastrostomy tube with eventual conversion to [place] tube.  She also underwent evaluation by SLP and had no obvious oral issues or overt aspiration.  She is of course at increased risk for aspiration due to her longstanding issues with esophageal dilations and strictures.  She was considered appropriate for initiating dysphagia level 2 diet with thin liquids if and when appropriate once procedures are done.  She underwent successful placement of G-tube on 04/13/2020 with interventional radiology. Enteral TF were started on 04/14/20.  She continued to have ongoing nausea and not quite tolerating tubes via the G-tube.  She was recommended for transitioning to [place] tube per surgery for  easier nutrition/feeding. She underwent successful G to [place] conversion with IR on 10/5. Her daughter is still awaiting outpatient delivery/availability of her TF formula.   Interval History:  Patient went back down to IR today.  She underwent successful conversion of G to [place] tube.  Daughter present bedside on my evaluation when she returned back to her room.  We discussed watching her overnight and probable discharge home tomorrow.  Daughter does note that the company still does not have her tube feed formula but they think it would be ready by Thursday or Friday; this may be the only other barrier to discharge, but we are hopeful for discharging on Wednesday.  Old records reviewed in assessment of this patient  ROS: Constitutional: negative for chills and fevers, Respiratory: negative for cough, Cardiovascular: negative for chest pain and Gastrointestinal: positive for abdominal pain  Assessment & Plan:  * SMAS (superior mesenteric artery syndrome) ([place]) Reportedly started approximately 48 hours after initial Covid vaccine. Initial CT abdomen showed distal esophageal wall thickening, dilated stomach and dilated proximal duodenum to the level of the SMA.  - S/p EGD 9/24, esophageal stricture dilatation, placement of NG tube, showed multiple duodenal ulcers which were biopsied and showed endoscopic appearance consistent with extraluminal compression by the SMA. Treatment of SMA syndrome would be to improve calorie intake and restore fat pad in between SMA and duodenum.  - GI and General surgery input appreciated. No surgical interventions at this time. Continue supportive treatment (NGT displaced on 9/27 but no N/V currently) - Goal is to improve nutrition to restore fat pad in between SMA and duodenum.  - s/p IR  placement of PEG on 9/30 with eventual GJ tube conversion if needed/not tolerating G-tube - d/c TPN (stopped 9/30) - continue on enteral TF; appreciate RD consult; per RD patient will  need pump to continue continuous TF at discharge  - patient now having more nausea and not tolerating TF; surgery has requested tube converted to [place]; underwent G to [place] tube conversion on 10/5; hopeful that this will solve her Nausea and enable her to tolerate TF going forward - monitor overnight; patient should be medically stable and ready for d/c on 10/6; only other barrier is that daughter is still waiting on TF formula to be available as she says the company was "out"; pump and tubing are obtained and daughter will be educated further on pump use today by RN bedside   Esophageal stricture Recurrent benign esophageal strictures due to previous acute necrotic esophagus and Candida esophagitis. Requiring multiple EGDs and dilatations. Had EGD and dilatation of strictures again on 9/24. - SLP eval on 9/29; technically okay for dysphagia level 2 thin liquids; continue aspiration precautions  - continue liquid diet orally for now and at discharge; long term management with GI and possible advancement in the future  Prediabetes - A1c 5.9% -Patient developing some hypoglycemia each time insulin has been given while on tube feeds.  May not require much insulin if at all if ongoing hypoglycemia -Sliding scale is adjusted for custom scale and will follow up glucose levels in response to this - continue q4h SSI and CBGs  Dysphagia - see esophageal stricture - CLD for now  Protein-calorie malnutrition, severe - continue TF - see esophageal stricture   Normocytic anemia - intermittent CBC  Gastric outlet obstruction - see esophageal stricture   Essential hypertension - controlled - continue current regimen   Gastrinoma S/p surgery 2014/15 - f/u pending gastrin level = 90 (normal)  Pruritus-resolved as of 04/14/2020 Reported by patient, family and patient's RNon 9/26. Reportedly having diffuse bodyitching.? Tongue swelling without thrush. Unclear etiology. Unsure if this is an  allergic reaction. No new medications started - overall has improved and no further swelling appreciated - d/c benadryl, pepcid, and solu-medrol and monitor   Body mass index is 14.12 kg/m.  Nutritional Status Nutrition Problem: Severe Malnutrition Etiology: chronic illness (COPD, recurrent esophageal strictures) Signs/Symptoms: percent weight loss, energy intake < or equal to 75% for > or equal to 1 month, severe fat depletion, severe muscle depletion Percent weight loss: 22 % (x 9 months) Interventions: Refer to RD note for recommendations  Antimicrobials: none  DVT prophylaxis: HSQ Code Status: Full Family Communication: none Disposition Plan: Status is: Inpatient  Remains inpatient appropriate because:IV treatments appropriate due to intensity of illness or inability to take PO and Inpatient level of care appropriate due to severity of illness   Dispo: The patient is from: Home              Anticipated d/c is to: Home              Anticipated d/c date is: Tentatively for Monday, 10/4              Patient currently is not medically stable to d/c.  Objective: Blood pressure 108/68, pulse 87, temperature 98.5 F (36.9 C), temperature source Oral, resp. rate 18, height 4\' 11"  (1.499 m), weight 31.7 kg, SpO2 100 %.  Examination: General appearance: frail and thin woman with hoarse voice laying in bed in NAD Head: Normocephalic, without obvious abnormality, atraumatic Eyes: EOMI Lungs: clear  to auscultation bilaterally Heart: regular rate and rhythm and S1, S2 normal Abdomen: minimal TTP in abdomen; BS present; feeding tube in place Extremities: thin, no edema Skin: mobility and turgor normal Neurologic: weak but no focal deficits   Consultants:   GI  Surgery  IR  Procedures:  EGD 9/24 NG tube placed during EGD 9/24 PICC line right upper extremity 9/26 IR placed PEG on 9/30  Data Reviewed: I have personally reviewed following labs and imaging  studies Results for orders placed or performed during the hospital encounter of 04/06/20 (from the past 24 hour(s))  Glucose, capillary     Status: Abnormal   Collection Time: 04/17/20  4:35 PM  Result Value Ref Range   Glucose-Capillary 131 (H) 70 - 99 mg/dL  Glucose, capillary     Status: Abnormal   Collection Time: 04/17/20  8:50 PM  Result Value Ref Range   Glucose-Capillary 102 (H) 70 - 99 mg/dL  Glucose, capillary     Status: Abnormal   Collection Time: 04/18/20  2:21 AM  Result Value Ref Range   Glucose-Capillary 123 (H) 70 - 99 mg/dL  Basic metabolic panel     Status: Abnormal   Collection Time: 04/18/20  4:12 AM  Result Value Ref Range   Sodium 137 135 - 145 mmol/L   Potassium 4.7 3.5 - 5.1 mmol/L   Chloride 94 (L) 98 - 111 mmol/L   CO2 38 (H) 22 - 32 mmol/L   Glucose, Bld 123 (H) 70 - 99 mg/dL   BUN 8 8 - 23 mg/dL   Creatinine, Ser 0.39 (L) 0.44 - 1.00 mg/dL   Calcium 9.3 8.9 - 10.3 mg/dL   GFR calc non Af Amer >60 >60 mL/min   GFR calc Af Amer >60 >60 mL/min   Anion gap 5 5 - 15  CBC with Differential/Platelet     Status: Abnormal   Collection Time: 04/18/20  4:12 AM  Result Value Ref Range   WBC 7.2 4.0 - 10.5 K/uL   RBC 3.61 (L) 3.87 - 5.11 MIL/uL   Hemoglobin 9.6 (L) 12.0 - 15.0 g/dL   HCT 31.2 (L) 36 - 46 %   MCV 86.4 80.0 - 100.0 fL   MCH 26.6 26.0 - 34.0 pg   MCHC 30.8 30.0 - 36.0 g/dL   RDW 16.6 (H) 11.5 - 15.5 %   Platelets 283 150 - 400 K/uL   nRBC 0.0 0.0 - 0.2 %   Neutrophils Relative % 32 %   Neutro Abs 2.3 1.7 - 7.7 K/uL   Lymphocytes Relative 41 %   Lymphs Abs 3.0 0.7 - 4.0 K/uL   Monocytes Relative 25 %   Monocytes Absolute 1.8 (H) 0 - 1 K/uL   Eosinophils Relative 2 %   Eosinophils Absolute 0.1 0 - 0 K/uL   Basophils Relative 0 %   Basophils Absolute 0.0 0 - 0 K/uL   Immature Granulocytes 0 %   Abs Immature Granulocytes 0.01 0.00 - 0.07 K/uL  Magnesium     Status: None   Collection Time: 04/18/20  4:12 AM  Result Value Ref Range    Magnesium 1.7 1.7 - 2.4 mg/dL  Glucose, capillary     Status: Abnormal   Collection Time: 04/18/20  4:21 AM  Result Value Ref Range   Glucose-Capillary 115 (H) 70 - 99 mg/dL  Glucose, capillary     Status: Abnormal   Collection Time: 04/18/20 12:15 PM  Result Value Ref Range   Glucose-Capillary 116 (H)  70 - 99 mg/dL   Comment 1 Notify RN    Comment 2 Document in Chart     No results found for this or any previous visit (from the past 240 hour(s)).   Radiology Studies: IR GASTR TUBE CONVERT GASTR-JEJ PER W/FL MOD SED  Result Date: 04/18/2020 INDICATION: 64 year old female with a history of multiple esophageal strictures requiring percutaneous enteric feeding. A percutaneous gastrostomy tube was placed on 04/13/2020. She presents today for conversion to a gastro jejunostomy tube to provide access for direct enteral nutrition. EXAM: CONVERT G-TUBE TO G-JTUBE MEDICATIONS: None. ANESTHESIA/SEDATION: None. CONTRAST:  34mL OMNIPAQUE IOHEXOL 300 MG/ML SOLN - administered into the gastric lumen. FLUOROSCOPY TIME:  Fluoroscopy Time: 15 minutes 24 seconds (74 mGy). COMPLICATIONS: None immediate. PROCEDURE: Informed written consent was obtained from the patient after a thorough discussion of the procedural risks, benefits and alternatives. All questions were addressed. Maximal Sterile Barrier Technique was utilized including caps, mask, sterile gowns, sterile gloves, sterile drape, hand hygiene and skin antiseptic. A timeout was performed prior to the initiation of the procedure. Contrast was injected through the existing percutaneous gastrostomy tube. The contrast fills the gastric lumen confirming that the tube is in the expected position. The retention balloon appears slightly overinflated. Therefore, the volume of the balloon was decreased by approximately 3 cc. A 5 French angled glide catheter was then advanced through the central lumen of the percutaneous gastrostomy tube and into the stomach. Utilizing a  Glidewire, the catheter and wire combination were successfully advanced through the gastric antrum, out the pylorus, through the duodenum and into the proximal jejunum. There was significant tendency for the wire to prolapse into the gastric fundus. Therefore, we decided to proceed with exchange for a more robust wire platform. The glidewire was exchanged for a road runner wire. A 12 [place] J arm extension was then exchanged for the glide catheter and advanced over the wire and positioned with the tip in the fourth portion of the duodenum at the ligament of Treitz. Contrast injection was performed confirming the location of the J arm extension. The J arm extension and G portion of the tube were both flushed with saline. Images were obtained and stored for the medical record. IMPRESSION: Successful conversion to a gastro jejunostomy tube by adding a 76F J-arm extension. The tip of the jejunostomy tube is at the ligament of Treitz just within the proximal jejunum. Tube ready for immediate use. Electronically Signed   By: Jacqulynn Cadet M.D.   On: 04/18/2020 10:03   IR GASTR TUBE CONVERT GASTR-JEJ PER W/FL MOD SED  Final Result    IR GASTROSTOMY TUBE MOD SED  Final Result    DG UGI W SINGLE CM (SOL OR THIN BA)  Final Result    [place] EKG SITE RITE  Final Result    DG Abd 1 View - KUB  Final Result    CT ABDOMEN PELVIS W CONTRAST  Final Result    DG Chest Port 1 View  Final Result      Scheduled Meds: . Chlorhexidine Gluconate Cloth  6 each Topical Daily  . docusate  100 mg Per Tube Daily  . feeding supplement (ENSURE ENLIVE)  237 mL Oral BID BM  . fluticasone furoate-vilanterol  1 puff Inhalation Daily  . heparin  5,000 Units Subcutaneous Q8H  . insulin aspart  0-4 Units Subcutaneous Q4H  . pantoprazole sodium  40 mg Per Tube BID  . polyethylene glycol  17 g Per Tube Daily  .  sodium chloride flush  10-40 mL Intracatheter Q12H  . sucralfate  1 g Per Tube TID WC & HS  . umeclidinium  bromide  1 puff Inhalation Daily   PRN Meds: acetaminophen (TYLENOL) oral liquid 160 mg/5 mL, albuterol, diphenhydrAMINE, hydrOXYzine, ondansetron **OR** ondansetron (ZOFRAN) IV, oxyCODONE, phenol, sodium chloride flush Continuous Infusions: . feeding supplement (OSMOLITE 1.2 CAL) 50 mL/hr at 04/18/20 1100      LOS: 11 days  Time spent: Greater than 50% of the 35 minute visit was spent in counseling/coordination of care for the patient as laid out in the A&P.   Dwyane Dee, MD [facility] 04/18/2020, 2:03 PM  Contact via secure chat.  To contact the attending provider between 7A-7P or the covering provider during after hours 7P-7A, please log into the web site [email or web address] and access using universal [place] password for that web site. If you do not have the password, please call the hospital operator.

## 2020-04-19 LAB — CBC WITH DIFFERENTIAL/PLATELET
Abs Immature Granulocytes: 0.03 10*3/uL (ref 0.00–0.07)
Basophils Absolute: 0 10*3/uL (ref 0.0–0.1)
Basophils Relative: 0 %
Eosinophils Absolute: 0.1 10*3/uL (ref 0.0–0.5)
Eosinophils Relative: 2 %
HCT: 30.2 % — ABNORMAL LOW (ref 36.0–46.0)
Hemoglobin: 9.3 g/dL — ABNORMAL LOW (ref 12.0–15.0)
Immature Granulocytes: 0 %
Lymphocytes Relative: 27 %
Lymphs Abs: 2.3 10*3/uL (ref 0.7–4.0)
MCH: 26.6 pg (ref 26.0–34.0)
MCHC: 30.8 g/dL (ref 30.0–36.0)
MCV: 86.5 fL (ref 80.0–100.0)
Monocytes Absolute: 1.6 10*3/uL — ABNORMAL HIGH (ref 0.1–1.0)
Monocytes Relative: 18 %
Neutro Abs: 4.6 10*3/uL (ref 1.7–7.7)
Neutrophils Relative %: 53 %
Platelets: 306 10*3/uL (ref 150–400)
RBC: 3.49 MIL/uL — ABNORMAL LOW (ref 3.87–5.11)
RDW: 16.6 % — ABNORMAL HIGH (ref 11.5–15.5)
WBC: 8.7 10*3/uL (ref 4.0–10.5)
nRBC: 0 % (ref 0.0–0.2)

## 2020-04-19 LAB — BASIC METABOLIC PANEL
Anion gap: 6 (ref 5–15)
BUN: 9 mg/dL (ref 8–23)
CO2: 38 mmol/L — ABNORMAL HIGH (ref 22–32)
Calcium: 8.7 mg/dL — ABNORMAL LOW (ref 8.9–10.3)
Chloride: 90 mmol/L — ABNORMAL LOW (ref 98–111)
Creatinine, Ser: 0.34 mg/dL — ABNORMAL LOW (ref 0.44–1.00)
GFR calc non Af Amer: >60 mL/min (ref >60)
Glucose, Bld: 118 mg/dL — ABNORMAL HIGH (ref 70–99)
Potassium: 4 mmol/L (ref 3.5–5.1)
Sodium: 134 mmol/L — ABNORMAL LOW (ref 135–145)

## 2020-04-19 LAB — MAGNESIUM: Magnesium: 1.9 mg/dL (ref 1.7–2.4)

## 2020-04-19 LAB — GLUCOSE, CAPILLARY
Glucose-Capillary: 116 mg/dL — ABNORMAL HIGH (ref 70–99)
Glucose-Capillary: 118 mg/dL — ABNORMAL HIGH (ref 70–99)
Glucose-Capillary: 161 mg/dL — ABNORMAL HIGH (ref 70–99)

## 2020-04-19 MED ORDER — PANTOPRAZOLE SODIUM 40 MG PO PACK: 40.0000 mg | PACK | Freq: Two times a day (BID) | ORAL | 0 refills | Status: AC

## 2020-04-19 MED ORDER — OSMOLITE 1.2 CAL PO LIQD: 1000.0000 mL | ORAL | 0 refills | Status: AC

## 2020-04-19 MED ORDER — ENSURE ENLIVE PO LIQD: 237.0000 mL | Freq: Two times a day (BID) | ORAL | 5 refills | Status: AC

## 2020-04-19 MED ORDER — DOCUSATE SODIUM 50 MG/5ML PO LIQD: 100.0000 mg | Freq: Every day | ORAL | 0 refills | Status: AC

## 2020-04-19 MED ORDER — HYDROXYZINE HCL 10 MG/5ML PO SYRP: 10.0000 mg | ORAL_SOLUTION | Freq: Three times a day (TID) | ORAL | 0 refills | Status: DC | PRN

## 2020-04-19 MED ORDER — SUCRALFATE 1 GM/10ML PO SUSP: 1.0000 g | Freq: Three times a day (TID) | ORAL | 0 refills | Status: DC

## 2020-04-19 NOTE — Discharge Instructions (Signed)
How to Care for a Feeding Tube  A feeding tube is a tube used to give medicine, water, and liquid food. A person may have this tube if she or he has trouble swallowing or cannot take food or medicine. Supplies needed to care for the tube site:  Clean gloves.  Clean washcloth, gauze pads, or soft paper towel.  Cotton swabs.  A skin barrier ointment or cream, such as petroleum jelly.  Soap and water.  Pre-cut foam pads or gauze (for around the tube).  Tube tape.  An anchoring device (optional). How to care for the tube site  1. Have all supplies ready and close to you. 2. Wash your hands. 3. Put on gloves. 4. Change any pad or gauze near the tube if: ? It is dirty. ? It is wet. ? It has been there for more than one day. 5. Check the skin around the tube. Call the doctor if you see any of these: ? Red skin. ? A rash. ? Swelling. ? Leaking fluid. ? Extra skin. 6. Dip the gauze and cotton swabs in water and soap. 7. Use the cotton swabs to wipe the skin that is closest to the tube. 8. Use the gauze pads to wipe the rest of the skin near the tube. 9. Rinse with water. 10. Dry the area with a clean washcloth, dry gauze pad, or soft paper towel. 11. If the skin is red, use a cotton swab to put on a skin barrier cream or ointment. Put it on by making little circles. Do not apply antibiotic ointments at the tube site. 12. Put a new pre-cut foam pad or gauze around the tube. If there is no fluid at the tube site, you do not need a pad or gauze. 13. Tape down the edges. 14. Use tape or an anchoring device to attach the tube to the skin. Do this for comfort or as told. Each time you use tape, put it in a different place. 15. Sit the person up. 16. Throw away used supplies. 17. Take off your gloves. 18. Wash your hands. Supplies needed to flush a feeding tube:  Clean gloves.  A clean 60 mL syringe that connects to the feeding tube.  A towel.  Germ-free (sterile) or purified  water. Follow these rules: ? Use germ-free water if:  Your body's defense system (immune system) is weak and you have trouble getting better from infections (are immunocompromised).  You do not know how many chemicals are in your water. ? Do not use water from lakes or other bodies of water unless you treat it or filter it first. ? To make drinking water pure by boiling:  Boil water for 1 minute or longer. Keep a lid over the water while it boils.  Let the water cool off to room temperature before you use it. How to flush a feeding tube 1. Have all supplies ready and close to you. 2. Wash your hands. 3. Put on gloves. 4. Pull 30 mL of water into the syringe. 5. Before you push water into (flush) the tube, put the towel under the tube to catch any fluid leaks. 6. Bend (kink) the feeding tube while you do one of these things: ? Disconnect it from the feeding-bag tubing. ? Take off the cap at the end of the tube. 7. Put the tip of the syringe into the end of the feeding tube. 8. Stop bending the tube. 9. Use the syringe to slowly put the water  in the tube. If the water will not go in the tube: ? Have the person lie on his or her left side. ? Try putting the water in the tube again. ? Do not push hard to make the water go in. 10. Take out the syringe and put the cap on the tube. 11. Throw away used supplies. 12. Take off your gloves. 13. Wash your hands. Follow these instructions at home: Caring for the tube  If the person has a foam pad or gauze near the tube, change it: ? Every day. ? When it is dirty. ? When it is wet.  Do not put antibiotic ointments by the tube. Flushing the tube  Do not use a syringe that is smaller than 60 mL.  Flush the tube at all of these times: ? Before you give medicine. ? Between medicines. ? After the person gets the last medicine before a feeding.  Do not mix medicines with formula. Do not mix medicines with other medicines.  Completely  flush medicines through the tube. That way, they will not mix with formula. Contact a doctor if:  The tube gets blocked or clogged.  You find any of these on the skin around the tube site: ? Red skin. ? A rash. ? Swelling. ? Leaking fluid. ? Extra skin. Summary  A feeding tube is a tube used to give medicine, water, and liquid food. A person may have this tube if she or he has trouble swallowing or cannot take food or medicine.  Follow the doctor's instructions to care for the tube site and flush the tube every day.  Contact a doctor if the tube gets blocked or clogged. This information is not intended to replace advice given to you by your health care provider. Make sure you discuss any questions you have with your health care provider. Document Revised: 06/13/2017 Document Reviewed: 08/09/2016 Elsevier Patient Education  2020 [employer].

## 2020-04-19 NOTE — Discharge Summary (Signed)
Physician Discharge Summary  [facility] DQQ:[id number] DOB: [birth year] DOA: 04/06/2020  PCP: [facility]  Admit date: 04/06/2020 Discharge date: 04/19/2020  Admitted From: Home Disposition:  Home  Recommendations for Outpatient Follow-up:  1. Follow up with PCP in 1-2 weeks 2. 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   General surgery and [facility] consulting. S/p EGD, esophageal stricture dilatation, placement of NG tube during endoscopy on 9/24. PICC line placed 9/26, TNA started 9/27. NGT came out overnight of 04/10/20 accidentally and patient was too nervous for replacement but denied any N/V nor abd pain.  After further discussion with GI and surgery, patient will undergo placement of percutaneous gastrostomy tube with eventual conversion to [place] tube.  She also underwent evaluation by SLP and had no obvious oral issues or overt aspiration.  She is of course at increased risk for aspiration due to her longstanding issues with esophageal dilations and strictures.  She was considered appropriate for initiating dysphagia level 2 diet with thin liquids if and when appropriate once  procedures are done.  She underwent successful placement of G-tube on 04/13/2020 with interventional radiology. Enteral TF were started on 04/14/20.  She continued to have ongoing nausea and not quite tolerating tubes via the G-tube.  She was recommended for transitioning to [place] tube per surgery for easier nutrition/feeding. She underwent successful G to [place] conversion with IR on 10/5. Her daughter is still awaiting outpatient delivery/availability of her TF formula.   Discharge Diagnoses:  Principal Problem:   SMAS (superior mesenteric artery syndrome) (HCC) Active Problems:   Gastrinoma   Essential hypertension   Abdominal pain   GERD (gastroesophageal reflux disease)   Pancreatic insufficiency   COPD (chronic obstructive pulmonary disease) (HCC)   Esophageal stricture   Protein-calorie malnutrition, severe   Dysphagia   Gastric outlet obstruction   Duodenal stenosis   Normocytic anemia   Prediabetes   * SMAS (superior mesenteric artery syndrome) ([place]) Reportedly started approximately 48 hours after initial Covid vaccine. Initial CT abdomen showed distal esophageal wall thickening, dilated stomach and dilated proximal duodenum to the level of the SMA.  - S/p EGD 9/24, esophageal stricture dilatation, placement of NG tube, showed multiple duodenal ulcers which were biopsied and showed endoscopic appearance consistent with extraluminal compression by the SMA. Treatment of SMA syndrome would be to improve calorie intake and restore fat pad in between SMA and duodenum.  - GI and General surgery input appreciated. No surgical interventions at this time.  - Goal is to improve nutrition to restore fat pad in between SMA and duodenum.  - s/p IR placement of PEG on 9/30 and later GJ conversion on 10/5 - d/c TPN(stopped 9/30) -Pt to f/u closely with GI  Esophageal stricture Recurrent benign esophageal strictures due to previous acute necrotic  esophagus and Candida esophagitis.  Requiring multiple EGDs and dilatations. Had EGD and dilatation of strictures again on 9/24. - SLP eval on 9/29;technicallyokay for dysphagia level 2 thin liquids; continue aspiration precautions  - continue liquid diet orally for now and at discharge; long term management with GI and possible advancement in the future  Prediabetes - A1c 5.9% --Was continued on SSI coverage while in hospital  Dysphagia - see esophageal stricture - On diet as tolerated  Protein-calorie malnutrition, severe - continue TF as tolerated - see esophageal stricture   Normocytic anemia - intermittent CBC  Gastric outlet obstruction - see esophageal stricture   Essential hypertension - Had remained stable  Gastrinoma S/p surgery 2014/15 - f/u pending gastrin level= 90 (normal)  Pruritus-resolved as of 04/14/2020 - overall has improved and no further swelling appreciated  Discharge Instructions   Allergies as of 04/19/2020      Reactions   Azithromycin Other (See Comments)   Unknown   Boniva [ibandronic Acid] Other (See Comments)   unknown   Gabapentin Other (See Comments)   unknown      Medication List    STOP taking these medications   calcium carbonate (dosed in mg elemental calcium) 1250 MG/5ML Susp   mirtazapine 15 MG tablet Commonly known as: REMERON   omeprazole 40 MG capsule Commonly known as: PRILOSEC   pantoprazole 40 MG tablet Commonly known as: PROTONIX Replaced by: pantoprazole sodium 40 mg/20 mL Pack   pregabalin 150 MG capsule Commonly known as: LYRICA   Vitamin D3 Liqd     TAKE these medications   albuterol (2.5 MG/3ML) 0.083% nebulizer solution Commonly known as: PROVENTIL Take 2.5 mg by nebulization every 6 (six) hours as needed for wheezing or shortness of breath. What changed: Another medication with the same name was removed. Continue taking this medication, and follow the directions you see here.   budesonide-formoterol 160-4.5 MCG/ACT  inhaler Commonly known as: SYMBICORT Inhale 1 puff into the lungs daily. What changed: when to take this   docusate 50 MG/5ML liquid Commonly known as: COLACE Place 10 mLs (100 mg total) into feeding tube daily. Start taking on: April 20, 2020   feeding supplement (OSMOLITE 1.2 CAL) Liqd Place 1,000 mLs into feeding tube continuous. What changed:   how much to take  how to take this  when to take this   feeding supplement (ENSURE ENLIVE) Liqd Take 237 mLs by mouth 2 (two) times daily between meals. What changed: You were already taking a medication with the same name, and this prescription was added. Make sure you understand how and when to take each.   fluticasone 50 MCG/ACT nasal spray Commonly known as: FLONASE Place 1 spray into both nostrils daily as needed for allergies or rhinitis.   hydrOXYzine 10 MG/5ML syrup Commonly known as: ATARAX Place 5 mLs (10 mg total) into feeding tube 3 (three) times daily as needed for itching.   pantoprazole sodium 40 mg/20 mL Pack Commonly known as: PROTONIX Place 20 mLs (40 mg total) into feeding tube 2 (two) times daily. Replaces: pantoprazole 40 MG tablet   sucralfate 1 GM/10ML suspension Commonly known as: CARAFATE Place 10 mLs (1 g total) into feeding tube 4 (four) times daily -  with meals and at bedtime. What changed:   how to take this  when to take this   Tiotropium Bromide Monohydrate 2.5 MCG/ACT Aers Inhale 2 puffs into the lungs daily.            Durable  Medical Equipment  (From admission, onward)         Start     Ordered   04/14/20 1238  For home use only DME Tube feeding  Once       Comments: Osmolite 1.2 at 67ml/hr   04/14/20 1239   04/14/20 1238  For home use only DME Tube feeding pump  Once       Question:  Length of Need  Answer:  Lifetime   04/14/20 1239          Follow-up Information    Cl[facility], [place]. Schedule an appointment as soon as possible for a visit in 2 week(s).    Contact information: [place] [address] [phone]              Allergies  Allergen Reactions  . Azithromycin Other (See Comments)    Unknown   . Boniva [Ibandronic Acid] Other (See Comments)    unknown  . Gabapentin Other (See Comments)    unknown    Consultations:  GI  General Surgery  IR  Procedures/Studies: DG Abd 1 View - KUB  Result Date: 04/07/2020 CLINICAL DATA:  NG tube placement EXAM: ABDOMEN - 1 VIEW COMPARISON:  04/03/2019 FINDINGS: The enteric tube courses through the gastric body and terminates near the pylorus/antrum. The bowel gas pattern is nonspecific. IMPRESSION: Enteric tube projects near the pylorus/antrum. Electronically Signed   By: Constance Holster M.D.   On: 04/07/2020 19:55   CT ABDOMEN PELVIS W CONTRAST  Result Date: 04/06/2020 CLINICAL DATA:  Abdominal pain EXAM: CT ABDOMEN AND PELVIS WITH CONTRAST TECHNIQUE: Multidetector CT imaging of the abdomen and pelvis was performed using the standard protocol following bolus administration of intravenous contrast. CONTRAST:  64mL OMNIPAQUE IOHEXOL 300 MG/ML  SOLN COMPARISON:  09/09/2019 FINDINGS: Lower chest: The lung bases are clear. The heart size is normal. Hepatobiliary: The liver is normal. Normal gallbladder.There is no biliary ductal dilation. Pancreas: Normal contours without ductal dilatation. No peripancreatic fluid collection. Spleen: Unremarkable. Adrenals/Urinary Tract: --Adrenal glands: Unremarkable. --Right kidney/ureter: No hydronephrosis or radiopaque kidney stones. --Left kidney/ureter: No hydronephrosis or radiopaque kidney stones. --Urinary bladder: The urinary bladder is distended. Stomach/Bowel: --Stomach/Duodenum: There is esophageal wall thickening involving the distal esophagus. The stomach is distended. The proximal duodenum is distended to the level of the SMA. --Small bowel: There is no evidence for small bowel obstruction. --Colon:  Unremarkable. --Appendix: Normal. Vascular/Lymphatic: Atherosclerotic calcification is present within the non-aneurysmal abdominal aorta, without hemodynamically significant stenosis. --No retroperitoneal lymphadenopathy. --No mesenteric lymphadenopathy. --No pelvic or inguinal lymphadenopathy. Reproductive: Status post hysterectomy. No adnexal mass. Other: No ascites or free air. There has been significant interval loss of subcutaneous fat since the patient's prior CT. Musculoskeletal. No acute displaced fractures. IMPRESSION: 1. Findings suggestive of developing SMA syndrome as detailed above. 2. Distended urinary bladder. Aortic Atherosclerosis (ICD10-I70.0). Electronically Signed   By: Constance Holster M.D.   On: 04/06/2020 23:13   IR GASTROSTOMY TUBE MOD SED  Result Date: 04/13/2020 INDICATION: 64 year old female with history of multiple esophageal strictures requiring percutaneous enteric access for nutritional supplementation. Plan for eventual J arm placement. EXAM: GASTROSTOMY TUBE PLACEMENT COMPARISON:  CT abdomen pelvis from 04/06/2020 MEDICATIONS: Ancef 2 gm IV; Antibiotics were administered within 1 hour of the procedure. CONTRAST:  Twenty mL of Omnipaque 300 administered into the gastric lumen. ANESTHESIA/SEDATION: Moderate (conscious) sedation was employed during this procedure. A total of Versed 2 mg and Fentanyl 100 mcg was administered intravenously. Moderate Sedation Time:  32 minutes. The patient's level of consciousness and vital signs were monitored continuously by radiology nursing throughout the procedure under my direct supervision. FLUOROSCOPY TIME:  Three minutes 18 seconds (6 mGy) COMPLICATIONS: None immediate. PROCEDURE: Informed written consent was obtained from the patient following explanation of the procedure, risks, benefits and alternatives. A time out was performed prior to the initiation of the procedure. Ultrasound scanning was performed to demarcate the edge of the left  lobe of the liver. Maximal barrier sterile technique utilized including caps, mask, sterile gowns, sterile gloves, large sterile drape, hand hygiene and Betadine prep. Review of pre-procedure CT scan demonstrates an adequate window for percutaneous placement of a gastrostomy tube. The patient was placed on the procedure table in the supine position. Pre-procedure abdominal film confirmed visualization of the transverse colon. An angled 5-French catheter was passed through the nares into the stomach. The patient was prepped and draped in usual sterile fashion. The stomach was insufflated with air via the indwelling nasogastric tube. Under fluoroscopy, a puncture site was selected and local analgesia achieved with 1% lidocaine infiltrated subcutaneously. Under fluoroscopic guidance, a gastropexy needle was passed into the stomach and the T-bar suture was released. Entry into the stomach was confirmed with fluoroscopy, aspiration of air, and injection of contrast material. This was repeated with an additional gastropexy suture (for a total of 2 fasteners). At the center of these gastropexy sutures, a dermatotomy was performed. An 18 gauge needle was passed into the stomach at the site of this dermatotomy, and position within the gastric lumen again confirmed under fluoroscopy using aspiration of air and contrast injection. An Amplatz guidewire was passed through this needle and intraluminal placement within the stomach was confirmed by fluoroscopy. The needle was removed. Over the guidewire, the percutaneous tract was dilated using a 10 mm x 4 cm semi-compliant angioplasty balloon, which was quickly deflated followed by passage of a 24 French percutaneous gastrostomy tube. The retention balloon of the percutaneous gastrostomy tube was inflated with 20 mL of sterile water with a small amount of contrast material. The tube was withdrawn until the retention balloon was at the edge of the gastric lumen. The external bumper  was brought to the abdominal wall. Contrast was injected through the gastrostomy tube, confirming intraluminal positioning. The patient tolerated the procedure well without any immediate post-procedural complications. FINDINGS: After successful fluoroscopic guided placement, the gastrostomy tube is appropriately positioned with internal retention balloon against the ventral aspect of the gastric lumen. IMPRESSION: Successful fluoroscopic insertion of a 24 French balloon retention gastrostomy tube. Ruthann Cancer, MD [facility] Radiology Electronically Signed   By: Ruthann Cancer MD   On: 04/13/2020 17:12   IR GASTR TUBE CONVERT GASTR-JEJ PER W/FL MOD SED  Result Date: 04/18/2020 INDICATION: 64 year old female with a history of multiple esophageal strictures requiring percutaneous enteric feeding. A percutaneous gastrostomy tube was placed on 04/13/2020. She presents today for conversion to a gastro jejunostomy tube to provide access for direct enteral nutrition. EXAM: CONVERT G-TUBE TO G-JTUBE MEDICATIONS: None. ANESTHESIA/SEDATION: None. CONTRAST:  60mL OMNIPAQUE IOHEXOL 300 MG/ML SOLN - administered into the gastric lumen. FLUOROSCOPY TIME:  Fluoroscopy Time: 15 minutes 24 seconds (74 mGy). COMPLICATIONS: None immediate. PROCEDURE: Informed written consent was obtained from the patient after a thorough discussion of the procedural risks, benefits and alternatives. All questions were addressed. Maximal Sterile Barrier Technique was utilized including caps, mask, sterile gowns, sterile gloves, sterile drape, hand hygiene and skin antiseptic. A timeout was performed  prior to the initiation of the procedure. Contrast was injected through the existing percutaneous gastrostomy tube. The contrast fills the gastric lumen confirming that the tube is in the expected position. The retention balloon appears slightly overinflated. Therefore, the volume of the balloon was  decreased by approximately 3 cc. A 5 French angled glide catheter was then advanced through the central lumen of the percutaneous gastrostomy tube and into the stomach. Utilizing a Glidewire, the catheter and wire combination were successfully advanced through the gastric antrum, out the pylorus, through the duodenum and into the proximal jejunum. There was significant tendency for the wire to prolapse into the gastric fundus. Therefore, we decided to proceed with exchange for a more robust wire platform. The glidewire was exchanged for a road runner wire. A 12 [place] J arm extension was then exchanged for the glide catheter and advanced over the wire and positioned with the tip in the fourth portion of the duodenum at the ligament of Treitz. Contrast injection was performed confirming the location of the J arm extension. The J arm extension and G portion of the tube were both flushed with saline. Images were obtained and stored for the medical record. IMPRESSION: Successful conversion to a gastro jejunostomy tube by adding a 87F J-arm extension. The tip of the jejunostomy tube is at the ligament of Treitz just within the proximal jejunum. Tube ready for immediate use. Electronically Signed   By: Jacqulynn Cadet M.D.   On: 04/18/2020 10:03   DG Chest Port 1 View  Result Date: 04/06/2020 CLINICAL DATA:  Shortness of breath, sepsis EXAM: PORTABLE CHEST 1 VIEW COMPARISON:  Chest x-ray 10/03/2019 chest x-ray 10/01/2019 FINDINGS: The heart size and mediastinal contours are within normal limits. Hyperinflation of the lungs suggestive of emphysema. Pulmonary sutures overlie the left hemithorax. Biapical pleural/pulmonary scarring again noted. No focal consolidation. No pulmonary edema. Blunting of the left costophrenic angle is again noted which may represent scarring versus trace pleural effusion. No pneumothorax. Well-defined symmetric bilateral lower/mid lung zone densities consistent with nipple shadows. No acute  osseous abnormality. IMPRESSION: No active cardiopulmonary disease. Electronically Signed   By: Iven Finn M.D.   On: 04/06/2020 21:53   DG UGI W SINGLE CM (SOL OR THIN BA)  Result Date: 04/10/2020 CLINICAL DATA:  Duodenal stenosis. EXAM: UPPER GI SERIES WITH KUB TECHNIQUE: After obtaining a scout radiograph a routine upper GI series was performed using water-soluble contrast. FLUOROSCOPY TIME:  Fluoroscopy Time:  1 minutes, 48 seconds Radiation Exposure Index (if provided by the fluoroscopic device): 13.20 mGy Number of Acquired Spot Images: 3 COMPARISON:  Abdominal radiographs 04/07/2020. CT abdomen/pelvis 04/06/2020. FINDINGS: A scout radiograph of the abdomen demonstrates a post-pyloric enteric tube. No dilated loops of bowel within the visualized abdomen. Surgical clips within the right lower quadrant. No acute bony abnormality. A problem-oriented water-soluble single contrast upper GI series was performed by injecting the enteric tube. There is contrast opacification of the proximal duodenum. There is narrowing of the third portion of the duodenum at the level of the SMA. However, contrast advances to the fourth portion of the duodenum without significant delay. The duodenal sweep is otherwise unremarkable. IMPRESSION: Problem-oriented water-soluble single contrast upper GI series as described. There is narrowing of the third portion of the duodenum at the level of the SMA, which may reflect a component of SMA compression. However, contrast advances from the 3rd to 4th portion of the duodenum without significant delay with the patient in the supine position. The duodenal sweep  is otherwise unremarkable. Electronically Signed   By: Kellie Simmering DO   On: 04/10/2020 09:52   [place] EKG SITE RITE  Result Date: 04/09/2020 If Site Rite image not attached, placement could not be confirmed due to current cardiac rhythm.    Subjective: Eager to go home  Discharge Exam: Vitals:   04/18/20 2030 04/19/20  0537  BP: 95/61 (!) 89/54  Pulse: 78 77  Resp: 20 20  Temp: 98.6 F (37 C) 97.9 F (36.6 C)  SpO2: 99% 99%   Vitals:   04/18/20 0953 04/18/20 1218 04/18/20 2030 04/19/20 0537  BP: 110/69 108/68 95/61 (!) 89/54  Pulse: 79 87 78 77  Resp:  18 20 20   Temp: 98.3 F (36.8 C) 98.5 F (36.9 C) 98.6 F (37 C) 97.9 F (36.6 C)  TempSrc: Oral Oral Oral Oral  SpO2: 100% 100% 99% 99%  Weight:      Height:        General: Pt is alert, awake, not in acute distress Cardiovascular: RRR, S1/S2 +, no rubs, no gallops Respiratory: CTA bilaterally, no wheezing, no rhonchi Abdominal: Soft, NT, ND, bowel sounds + Extremities: no edema, no cyanosis   The results of significant diagnostics from this hospitalization (including imaging, microbiology, ancillary and laboratory) are listed below for reference.     Microbiology: No results found for this or any previous visit (from the past 240 hour(s)).   Labs: BNP (last 3 results) Recent Labs    09/09/19 1507  BNP 01.0   Basic Metabolic Panel: Recent Labs  Lab 04/13/20 0359 04/13/20 0359 04/14/20 0602 04/15/20 0404 04/17/20 0607 04/18/20 0412 04/19/20 0451  NA 140   < > 136 132* 137 137 134*  K 4.9   < > 4.6 4.4 4.0 4.7 4.0  CL 94*   < > 91* 90* 96* 94* 90*  CO2 40*   < > 38* 36* 36* 38* 38*  GLUCOSE 101*   < > 80 123* 129* 123* 118*  BUN 13   < > 10 11 10 8 9   CREATININE 0.34*   < > 0.37* 0.41* 0.36* 0.39* 0.34*  CALCIUM 9.2   < > 8.9 8.9 8.5* 9.3 8.7*  MG 2.0   < > 1.9 1.8 1.6* 1.7 1.9  PHOS 4.0  --   --   --   --   --   --    < > = values in this interval not displayed.   Liver Function Tests: Recent Labs  Lab 04/13/20 0359  AST 38  ALT 46*  ALKPHOS 45  BILITOT 0.3  PROT 5.7*  ALBUMIN 3.5   No results for input(s): LIPASE, AMYLASE in the last 168 hours. No results for input(s): AMMONIA in the last 168 hours. CBC: Recent Labs  Lab 04/14/20 0602 04/15/20 0404 04/17/20 0607 04/18/20 0412 04/19/20 0451  WBC  8.9 7.3 6.0 7.2 8.7  NEUTROABS 4.3 2.8 2.1 2.3 4.6  HGB 11.5* 11.1* 9.1* 9.6* 9.3*  HCT 37.1 35.8* 28.5* 31.2* 30.2*  MCV 84.9 84.8 83.6 86.4 86.5  PLT 220 239 268 283 306   Cardiac Enzymes: No results for input(s): CKTOTAL, CKMB, CKMBINDEX, TROPONINI in the last 168 hours. BNP: Invalid input(s): POCBNP CBG: Recent Labs  Lab 04/18/20 1632 04/18/20 2031 04/19/20 0053 04/19/20 0429 04/19/20 0726  GLUCAP 117* 123* 118* 116* 161*   D-Dimer No results for input(s): DDIMER in the last 72 hours. Hgb A1c Recent Labs    04/17/20 1156  HGBA1C 5.9*  Lipid Profile No results for input(s): CHOL, HDL, LDLCALC, TRIG, CHOLHDL, LDLDIRECT in the last 72 hours. Thyroid function studies No results for input(s): TSH, T4TOTAL, T3FREE, THYROIDAB in the last 72 hours.  Invalid input(s): FREET3 Anemia work up No results for input(s): VITAMINB12, FOLATE, FERRITIN, TIBC, IRON, RETICCTPCT in the last 72 hours. Urinalysis    Component Value Date/Time   COLORURINE YELLOW 04/06/2020 1830   APPEARANCEUR CLEAR 04/06/2020 1830   LABSPEC 1.027 04/06/2020 1830   PHURINE 5.0 04/06/2020 1830   GLUCOSEU NEGATIVE 04/06/2020 1830   HGBUR SMALL (A) 04/06/2020 1830   BILIRUBINUR NEGATIVE 04/06/2020 1830   KETONESUR 80 (A) 04/06/2020 1830   PROTEINUR 30 (A) 04/06/2020 1830   UROBILINOGEN 0.2 12/27/2012 0454   NITRITE NEGATIVE 04/06/2020 1830   LEUKOCYTESUR NEGATIVE 04/06/2020 1830   Sepsis Labs Invalid input(s): PROCALCITONIN,  WBC,  LACTICIDVEN Microbiology No results found for this or any previous visit (from the past 240 hour(s)).  Time spent: 30 min   SIGNED:   Marylu Lund, MD  [facility] 04/19/2020, 10:26 AM  If 7PM-7AM, please contact night-coverage

## 2020-04-19 NOTE — Progress Notes (Signed)
[facility] Progress Note  12 Days Post-Op  Subjective: Patient reports she did not sleep well overnight secondary to pain. She does report less nausea this AM. She still has not had a BM in several days.   Objective: Vital signs in last 24 hours: Temp:  [97.9 F (36.6 C)-98.6 F (37 C)] 97.9 F (36.6 C) (10/06 0537) Pulse Rate:  [77-87] 77 (10/06 0537) Resp:  [18-20] 20 (10/06 0537) BP: (89-110)/(54-69) 89/54 (10/06 0537) SpO2:  [99 %-100 %] 99 % (10/06 0537) Last BM Date: 04/12/20  Intake/Output from previous day: 10/05 0701 - 10/06 0700 In: 300 [NG/GT:300] Out: 500 [Urine:500] Intake/Output this shift: Total I/O In: -  Out: 500 [Urine:500]  PE: General appearance:alert, chronically ill appearing, NAD RK:YHCW, non-tender,G-tube present with some bleeding around,bowel sounds normal, no palpable masses   Lab Results:  Recent Labs    04/18/20 0412 04/19/20 0451  WBC 7.2 8.7  HGB 9.6* 9.3*  HCT 31.2* 30.2*  PLT 283 306   BMET Recent Labs    04/18/20 0412 04/19/20 0451  NA 137 134*  K 4.7 4.0  CL 94* 90*  CO2 38* 38*  GLUCOSE 123* 118*  BUN 8 9  CREATININE 0.39* 0.34*  CALCIUM 9.3 8.7*   PT/INR No results for input(s): LABPROT, INR in the last 72 hours. CMP     Component Value Date/Time   NA 134 (L) 04/19/2020 0451   K 4.0 04/19/2020 0451   CL 90 (L) 04/19/2020 0451   CO2 38 (H) 04/19/2020 0451   GLUCOSE 118 (H) 04/19/2020 0451   BUN 9 04/19/2020 0451   CREATININE 0.34 (L) 04/19/2020 0451   CALCIUM 8.7 (L) 04/19/2020 0451   PROT 5.7 (L) 04/13/2020 0359   ALBUMIN 3.5 04/13/2020 0359   AST 38 04/13/2020 0359   ALT 46 (H) 04/13/2020 0359   ALKPHOS 45 04/13/2020 0359   BILITOT 0.3 04/13/2020 0359   GFRNONAA >60 04/19/2020 0451   GFRAA >60 04/18/2020 0412   Lipase     Component Value Date/Time   LIPASE 21 10/01/2019 1644       Studies/Results: IR GASTR TUBE CONVERT GASTR-JEJ PER W/FL MOD SED  Result Date:  04/18/2020 INDICATION: 64 year old female with a history of multiple esophageal strictures requiring percutaneous enteric feeding. A percutaneous gastrostomy tube was placed on 04/13/2020. She presents today for conversion to a gastro jejunostomy tube to provide access for direct enteral nutrition. EXAM: CONVERT G-TUBE TO G-JTUBE MEDICATIONS: None. ANESTHESIA/SEDATION: None. CONTRAST:  8mL OMNIPAQUE IOHEXOL 300 MG/ML SOLN - administered into the gastric lumen. FLUOROSCOPY TIME:  Fluoroscopy Time: 15 minutes 24 seconds (74 mGy). COMPLICATIONS: None immediate. PROCEDURE: Informed written consent was obtained from the patient after a thorough discussion of the procedural risks, benefits and alternatives. All questions were addressed. Maximal Sterile Barrier Technique was utilized including caps, mask, sterile gowns, sterile gloves, sterile drape, hand hygiene and skin antiseptic. A timeout was performed prior to the initiation of the procedure. Contrast was injected through the existing percutaneous gastrostomy tube. The contrast fills the gastric lumen confirming that the tube is in the expected position. The retention balloon appears slightly overinflated. Therefore, the volume of the balloon was decreased by approximately 3 cc. A 5 French angled glide catheter was then advanced through the central lumen of the percutaneous gastrostomy tube and into the stomach. Utilizing a Glidewire, the catheter and wire combination were successfully advanced through the gastric antrum, out the pylorus, through the duodenum and into the proximal jejunum. There  was significant tendency for the wire to prolapse into the gastric fundus. Therefore, we decided to proceed with exchange for a more robust wire platform. The glidewire was exchanged for a road runner wire. A 12 [place] J arm extension was then exchanged for the glide catheter and advanced over the wire and positioned with the tip in the fourth portion of the duodenum at the  ligament of Treitz. Contrast injection was performed confirming the location of the J arm extension. The J arm extension and G portion of the tube were both flushed with saline. Images were obtained and stored for the medical record. IMPRESSION: Successful conversion to a gastro jejunostomy tube by adding a 47F J-arm extension. The tip of the jejunostomy tube is at the ligament of Treitz just within the proximal jejunum. Tube ready for immediate use. Electronically Signed   By: Jacqulynn Cadet M.D.   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 They could not determine whether the neuroendocrine tumor was metastatic.No pancreatic tissue mentioned on path report. 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 ?SMA compression. Contrast advances into the 3rd and 4th portions without delay, in the supine position.  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 -Followed by [facility] with multiple esophageal dilatations for chronic esophageal stricture;DilatedFriday 9/24 -Dr. Bryan Lemma Severe malnutrition-BMI 13.67/prealbumin 6.3>10.2>11.6 (10/4) - s/p IR G-tube placement 9/30, s/pconversion from G-tube to G-J 10/5   FEN:CLD, TF @50  cc/h ID: None DVT: SCDs, SQ heparin Follow-up: TBD  Patient can be discharged from a surgical perspective once tolerating TF and she is set up for home TF. She should follow up with GI. If she improves with weight gain alone she may not need surgical bypass. If she does not improve with weight gain alone she can be referred back to our office for consideration of surgical bypass. I think if considering surgical gastrojejunostomy we also need to consider if she will need partial esophagectomy at any point, as any surgery we did on her stomach may affect this. No other surgical recommendations at this time.   LOS: 12 days    Norm Parcel , [facility] 04/19/2020, 9:23 AM Please see Amion for pager number during day hours 7:00am-4:30pm

## 2020-04-19 NOTE — TOC Transition Note (Signed)
Transition of Care [facility]) - CM/SW Discharge Note   Patient Details  Name: Claudia Wiley MRN: [id number] Date of Birth: July 19, 1955  Transition of Care [facility]) CM/SW Contact:  Elin Seats, Marjie Skiff, RN Phone Number: 04/19/2020, 9:46 AM   Clinical Narrative:     Daughter [name] has received tube feeds, pump and all supplies from the [place]. This CM also notified [facility] liaison that pt would discharge today.  Final next level of care: [place] Barriers to Discharge: Continued Medical Work up   Patient Goals and CMS Choice Patient states their goals for this hospitalization and ongoing recovery are:: to get home         Discharge Plan and Services   Discharge Planning Services: CM Consult            DME Arranged: Tube feeding, Tube feeding pump DME Agency:  ([facility]) Date DME Agency Contacted: 04/14/20 Time DME Agency Contacted: 1300 Representative spoke with at DME Agency: Ralene Bathe at [place]: PT, RN [facility] Agency:  ([facility]) Date [place]: 04/14/20 Time [place]: 29 Representative spoke with at [place]: [place] ([place]) Interventions     Readmission Risk Interventions Readmission Risk Prevention Plan 04/14/2020 10/05/2019  Transportation Screening Complete Complete  PCP or Specialist Appt within 3-5 Days Complete Complete  HRI or Media Complete Not Complete  Social Work Consult for [place] Planning/Counseling Complete Not Complete  Palliative Care Screening Not Applicable Not Applicable  Medication Review Press photographer) Complete Complete  Some recent data might be hidden

## 2020-04-21 ENCOUNTER — Inpatient Hospital Stay (HOSPITAL_COMMUNITY): Admission: RE | Admit: 2020-04-21 | Payer: No Typology Code available for payment source | Source: Ambulatory Visit

## 2020-04-21 ENCOUNTER — Encounter (HOSPITAL_COMMUNITY): Payer: Self-pay | Admitting: Emergency Medicine

## 2020-04-21 ENCOUNTER — Emergency Department (HOSPITAL_COMMUNITY)
Admission: EM | Admit: 2020-04-21 | Discharge: 2020-04-21 | Disposition: A | Discharge status: Home / Self Care | Payer: No Typology Code available for payment source | Attending: Emergency Medicine | Admitting: Emergency Medicine

## 2020-04-21 ENCOUNTER — Emergency Department (HOSPITAL_COMMUNITY): Payer: No Typology Code available for payment source

## 2020-04-21 ENCOUNTER — Other Ambulatory Visit: Payer: Self-pay

## 2020-04-21 DIAGNOSIS — Z79899 Other long term (current) drug therapy: Secondary | ICD-10-CM | POA: Diagnosis not present

## 2020-04-21 DIAGNOSIS — J449 Chronic obstructive pulmonary disease, unspecified: Secondary | ICD-10-CM | POA: Insufficient documentation

## 2020-04-21 DIAGNOSIS — R1084 Generalized abdominal pain: Secondary | ICD-10-CM | POA: Insufficient documentation

## 2020-04-21 DIAGNOSIS — R112 Nausea with vomiting, unspecified: Secondary | ICD-10-CM

## 2020-04-21 DIAGNOSIS — I10 Essential (primary) hypertension: Secondary | ICD-10-CM | POA: Diagnosis not present

## 2020-04-21 DIAGNOSIS — Z20822 Contact with and (suspected) exposure to covid-19: Secondary | ICD-10-CM | POA: Diagnosis not present

## 2020-04-21 DIAGNOSIS — Z87891 Personal history of nicotine dependence: Secondary | ICD-10-CM | POA: Insufficient documentation

## 2020-04-21 DIAGNOSIS — Z85028 Personal history of other malignant neoplasm of stomach: Secondary | ICD-10-CM | POA: Diagnosis not present

## 2020-04-21 DIAGNOSIS — Z85118 Personal history of other malignant neoplasm of bronchus and lung: Secondary | ICD-10-CM | POA: Insufficient documentation

## 2020-04-21 LAB — CBC WITH DIFFERENTIAL/PLATELET
Abs Immature Granulocytes: 0.04 10*3/uL (ref 0.00–0.07)
Basophils Absolute: 0.1 10*3/uL (ref 0.0–0.1)
Basophils Relative: 0 %
Eosinophils Absolute: 0 10*3/uL (ref 0.0–0.5)
Eosinophils Relative: 0 %
HCT: 39.7 % (ref 36.0–46.0)
Hemoglobin: 12.6 g/dL (ref 12.0–15.0)
Immature Granulocytes: 0 %
Lymphocytes Relative: 13 %
Lymphs Abs: 1.7 10*3/uL (ref 0.7–4.0)
MCH: 26.1 pg (ref 26.0–34.0)
MCHC: 31.7 g/dL (ref 30.0–36.0)
MCV: 82.4 fL (ref 80.0–100.0)
Monocytes Absolute: 1.3 10*3/uL — ABNORMAL HIGH (ref 0.1–1.0)
Monocytes Relative: 10 %
Neutro Abs: 10.1 10*3/uL — ABNORMAL HIGH (ref 1.7–7.7)
Neutrophils Relative %: 77 %
Platelets: 554 10*3/uL — ABNORMAL HIGH (ref 150–400)
RBC: 4.82 MIL/uL (ref 3.87–5.11)
RDW: 17.2 % — ABNORMAL HIGH (ref 11.5–15.5)
WBC: 13.3 10*3/uL — ABNORMAL HIGH (ref 4.0–10.5)
nRBC: 0 % (ref 0.0–0.2)

## 2020-04-21 LAB — COMPREHENSIVE METABOLIC PANEL
ALT: 25 U/L (ref 0–44)
AST: 24 U/L (ref 15–41)
Albumin: 4.6 g/dL (ref 3.5–5.0)
Alkaline Phosphatase: 75 U/L (ref 38–126)
Anion gap: 15 (ref 5–15)
BUN: 20 mg/dL (ref 8–23)
CO2: 32 mmol/L (ref 22–32)
Calcium: 10.3 mg/dL (ref 8.9–10.3)
Chloride: 92 mmol/L — ABNORMAL LOW (ref 98–111)
Creatinine, Ser: 0.43 mg/dL — ABNORMAL LOW (ref 0.44–1.00)
GFR calc non Af Amer: >60 mL/min (ref >60)
Glucose, Bld: 155 mg/dL — ABNORMAL HIGH (ref 70–99)
Potassium: 4.2 mmol/L (ref 3.5–5.1)
Sodium: 139 mmol/L (ref 135–145)
Total Bilirubin: 0.6 mg/dL (ref 0.3–1.2)
Total Protein: 8.1 g/dL (ref 6.5–8.1)

## 2020-04-21 LAB — RESPIRATORY PANEL BY RT PCR (FLU A&B, COVID)
Influenza A by PCR: NEGATIVE
Influenza B by PCR: NEGATIVE
SARS Coronavirus 2 by RT PCR: NEGATIVE

## 2020-04-21 LAB — LIPASE, BLOOD: Lipase: 34 U/L (ref 11–51)

## 2020-04-21 MED ORDER — METOCLOPRAMIDE HCL 5 MG/ML IJ SOLN
5.0000 mg | Freq: Once | INTRAMUSCULAR | Status: AC
  Administered 2020-04-21: 5 mg via INTRAVENOUS
  Filled 2020-04-21: qty 2

## 2020-04-21 MED ORDER — IOHEXOL 300 MG/ML  SOLN
50.0000 mL | Freq: Once | INTRAMUSCULAR | Status: AC | PRN
  Administered 2020-04-21: 30 mL

## 2020-04-21 MED ORDER — ONDANSETRON 8 MG PO TBDP: 8.0000 mg | ORAL_TABLET | Freq: Three times a day (TID) | ORAL | 0 refills | Status: DC | PRN

## 2020-04-21 MED ORDER — SODIUM CHLORIDE 0.9 % IV SOLN: INTRAVENOUS | Status: DC

## 2020-04-21 MED ORDER — SODIUM CHLORIDE 0.9 % IV BOLUS
500.0000 mL | Freq: Once | INTRAVENOUS | Status: AC
  Administered 2020-04-21: 500 mL via INTRAVENOUS

## 2020-04-21 MED ORDER — ONDANSETRON HCL 4 MG/2ML IJ SOLN
4.0000 mg | Freq: Once | INTRAMUSCULAR | Status: AC
  Administered 2020-04-21: 4 mg via INTRAVENOUS
  Filled 2020-04-21: qty 2

## 2020-04-21 MED ORDER — MORPHINE SULFATE (PF) 4 MG/ML IV SOLN
4.0000 mg | Freq: Once | INTRAVENOUS | Status: AC
  Administered 2020-04-21: 4 mg via INTRAVENOUS
  Filled 2020-04-21: qty 1

## 2020-04-21 NOTE — ED Triage Notes (Signed)
Patient arrives from home via EMS, C/C emesis and abdominal pain since J-tube placement. Has a J tube and has had increasing nausea. Daughter gives her tube feedings, pt states she just throws them back up.

## 2020-04-21 NOTE — ED Provider Notes (Signed)
[facility] DEPT Provider Note   CSN: [id number] Arrival date & time: 04/21/20  9563     History Chief Complaint  Patient presents with  . Emesis  . Abdominal Pain    Claudia Wiley is a 64 y.o. female.  64 year old female who presents with nausea vomiting times several days.  Patient had a conversion from a G-tube to a GJ tube 3 days ago and has had emesis since then.  Denies any fever or chills.  Emesis has been nonbilious or bloody.  Does note severe abdominal cramping.  No treatment use prior to arrival        Past Medical History:  Diagnosis Date  . Anemia   . Arthritis   . Cancer [facility]) 2013   Upper left lung and stomach cancer  . COPD (chronic obstructive pulmonary disease) ([place])   . Esophageal stricture   . Family history of brain cancer   . Family history of melanoma   . Gastrinoma   . Gastrinoma   . GERD (gastroesophageal reflux disease)   . Hypertension   . Osteoporosis   . Pancreatic insufficiency   . Pneumonia   . PONV (postoperative nausea and vomiting)   . Vitamin D deficiency   . Weight loss, unintentional     Patient Active Problem List   Diagnosis Date Noted  . Prediabetes 04/17/2020  . Normocytic anemia 04/14/2020  . Duodenal stenosis   . SMAS (superior mesenteric artery syndrome) ([place]) 04/07/2020  . Gastric outlet obstruction   . Dysphagia   . Protein-calorie malnutrition, severe 10/06/2019  . Orthostasis 10/01/2019  . Candida esophagitis ([place]) 10/01/2019  . Odynophagia   . Leukocytosis   . Esophagitis   . Esophageal dysphagia   . Esophageal stricture   . Respiratory failure ([place]) 09/09/2019  . COPD (chronic obstructive pulmonary disease) ([place]) 06/30/2019  . Acute on chronic respiratory failure with hypoxia and hypercapnia ([place]) 06/29/2019  . GERD (gastroesophageal reflux disease)   . Pancreatic insufficiency   . Nausea & vomiting 04/03/2019  . Abdominal pain 04/03/2019  . Abnormal finding on  urinalysis 04/03/2019  . Acute respiratory failure with hypoxia and hypercapnia ([place]) 04/02/2019  . COPD with acute exacerbation ([place]) 06/15/2018  . Essential hypertension 06/15/2018  . Genetic testing 06/01/2018  . Gastrinoma   . Family history of melanoma   . Family history of brain cancer   . Hypokalemia 10/19/2011  . Unspecified protein-calorie malnutrition ([place]) 10/19/2011  . Weight loss 10/19/2011  . Generalized weakness 10/19/2011  . Anodontia 10/19/2011    Past Surgical History:  Procedure Laterality Date  . ABDOMINAL HYSTERECTOMY    . BIOPSY  09/11/2019   Procedure: BIOPSY;  Surgeon: Lavena Bullion, DO;  Location: [facility];  Service: Gastroenterology;;  . BIOPSY  10/03/2019   Procedure: BIOPSY;  Surgeon: Jerene Bears, MD;  Location: [facility];  Service: Gastroenterology;;  . BIOPSY  04/07/2020   Procedure: BIOPSY;  Surgeon: Lavena Bullion, DO;  Location: [facility];  Service: Gastroenterology;;  . BREAST LUMPECTOMY    . CESAREAN SECTION    . ESOPHAGEAL DILATION  03/13/2020   Procedure: ESOPHAGEAL DILATION;  Surgeon: [name], MD;  Location: [facility];  Service: Endoscopy;;  . ESOPHAGOGASTRODUODENOSCOPY (EGD) WITH PROPOFOL N/A 09/11/2019   Procedure: ESOPHAGOGASTRODUODENOSCOPY (EGD) WITH PROPOFOL;  Surgeon: Lavena Bullion, DO;  [facility]: [facility];  Service: Gastroenterology;  Laterality: N/A;  . ESOPHAGOGASTRODUODENOSCOPY (EGD) WITH PROPOFOL N/A 10/03/2019   Procedure: ESOPHAGOGASTRODUODENOSCOPY (EGD) WITH PROPOFOL;  Surgeon: Jerene Bears, MD;  [facility];  Service: Gastroenterology;  Laterality: N/A;  . ESOPHAGOGASTRODUODENOSCOPY (EGD) WITH PROPOFOL N/A 10/19/2019   Procedure: ESOPHAGOGASTRODUODENOSCOPY (EGD) WITH PROPOFOL;  Surgeon: Yetta Flock, MD;  [facility]: [facility];  Service: Gastroenterology;  Laterality: N/A;  possible dilation  . ESOPHAGOGASTRODUODENOSCOPY (EGD) WITH PROPOFOL N/A 11/02/2019   Procedure:  ESOPHAGOGASTRODUODENOSCOPY (EGD) WITH FLUORO AND  WITH PROPOFOL;  Surgeon: [name], MD;  [facility]: [facility];  Service: Endoscopy;  Laterality: N/A;  need fluoro  . ESOPHAGOGASTRODUODENOSCOPY (EGD) WITH PROPOFOL N/A 11/16/2019   Procedure: ESOPHAGOGASTRODUODENOSCOPY (EGD) WITH PROPOFOL;  Surgeon: [name], MD;  [facility];  Service: Endoscopy;  Laterality: N/A;  . ESOPHAGOGASTRODUODENOSCOPY (EGD) WITH PROPOFOL N/A 11/29/2019   Procedure: ESOPHAGOGASTRODUODENOSCOPY (EGD) WITH PROPOFOL;  Surgeon: [name], MD;  [facility];  Service: Endoscopy;  Laterality: N/A;  need fluoro  . ESOPHAGOGASTRODUODENOSCOPY (EGD) WITH PROPOFOL N/A 12/21/2019   Procedure: ESOPHAGOGASTRODUODENOSCOPY (EGD) WITH PROPOFOL;  Surgeon: [name], MD;  [facility];  Service: Endoscopy;  Laterality: N/A;  . ESOPHAGOGASTRODUODENOSCOPY (EGD) WITH PROPOFOL N/A 02/07/2020   Procedure: ESOPHAGOGASTRODUODENOSCOPY (EGD) WITH PROPOFOL;  Surgeon: [name], MD;  [facility];  Service: Endoscopy;  Laterality: N/A;  . ESOPHAGOGASTRODUODENOSCOPY (EGD) WITH PROPOFOL N/A 03/13/2020   Procedure: ESOPHAGOGASTRODUODENOSCOPY (EGD) WITH PROPOFOL;  Surgeon: [name], MD;  [facility];  Service: Endoscopy;  Laterality: N/A;  With dilation  . ESOPHAGOGASTRODUODENOSCOPY (EGD) WITH PROPOFOL N/A 04/07/2020   Procedure: ESOPHAGOGASTRODUODENOSCOPY (EGD) WITH PROPOFOL;  Surgeon: Lavena Bullion, DO;  [facility];  Service: Gastroenterology;  Laterality: N/A;  . IR GASTR TUBE CONVERT GASTR-JEJ PER W/FL MOD SED  04/18/2020  . IR GASTROSTOMY TUBE MOD SED  04/13/2020  . LUNG SURGERY    . SAVORY DILATION N/A 10/19/2019   Procedure: SAVORY DILATION;  Surgeon: Yetta Flock, MD;  [facility];  Service: Gastroenterology;  Laterality: N/A;  . SAVORY DILATION N/A 11/02/2019   Procedure: SAVORY DILATION;  Surgeon: [name], MD;  [facility];  Service: Endoscopy;  Laterality: N/A;  fluoro  . SAVORY DILATION N/A 11/16/2019   Procedure: SAVORY DILATION;  Surgeon: [name], MD;  [facility];  Service: Endoscopy;  Laterality: N/A;  . SAVORY DILATION N/A 11/29/2019   Procedure: SAVORY DILATION;  Surgeon: [name], MD;  [facility];  Service: Endoscopy;  Laterality: N/A;  . SAVORY DILATION N/A 12/21/2019   Procedure: SAVORY DILATION;  Surgeon: [name], MD;  [facility];  Service: Endoscopy;  Laterality: N/A;  . SAVORY DILATION N/A 02/07/2020   Procedure: SAVORY DILATION;  Surgeon: [name], MD;  [facility];  Service: Endoscopy;  Laterality: N/A;     OB History   No obstetric history on file.     Family History  Problem Relation Age of Onset  . Diabetes Sister        x 3  . Cancer Sister 60       brain that metastized to bone, stomach and other organs  . Melanoma Maternal Aunt   . Heart attack Father   . Pneumonia Brother   . Cancer Other 17       cancer in his chest - nephew  . Colon cancer Neg Hx   . Pancreatic cancer Neg Hx   . Rectal cancer Neg Hx   . Esophageal cancer Neg Hx     Social History  Tobacco Use  . Smoking status: Former Smoker    Packs/day: 0.25    Years: 2.00    Pack years: 0.50    Types: Cigarettes    Quit date: 05/30/2019    Years since quitting: 0.8  . Smokeless tobacco: Never Used  . Tobacco comment: 2 months- quit date  Vaping Use  . Vaping Use: Never used  Substance Use Topics  . Alcohol use: No  . Drug use: No    Home Medications Prior to Admission medications   Medication Sig Start Date End Date Taking? Authorizing Provider  albuterol (PROVENTIL) (2.5 MG/3ML) 0.083% nebulizer solution Take 2.5 mg by nebulization every 6 (six) hours as needed for wheezing or shortness of breath.    [provider]  budesonide-formoterol (SYMBICORT) 160-4.5 MCG/ACT inhaler Inhale 1 puff into the lungs  daily. Patient taking differently: Inhale 1 puff into the lungs in the morning and at bedtime.  12/21/19   [name], MD  docusate (COLACE) 50 MG/5ML liquid Place 10 mLs (100 mg total) into feeding tube daily. 04/20/20   Donne Hazel, MD  feeding supplement, ENSURE ENLIVE, (ENSURE ENLIVE) LIQD Take 237 mLs by mouth 2 (two) times daily between meals. 04/19/20   Donne Hazel, MD  fluticasone (FLONASE) 50 MCG/ACT nasal spray Place 1 spray into both nostrils daily as needed for allergies or rhinitis.    [provider]  hydrOXYzine (ATARAX) 10 MG/5ML syrup Place 5 mLs (10 mg total) into feeding tube 3 (three) times daily as needed for itching. 04/19/20   Donne Hazel, MD  Nutritional Supplements (FEEDING SUPPLEMENT, OSMOLITE 1.2 CAL,) LIQD Place 1,000 mLs into feeding tube continuous. 04/19/20   Donne Hazel, MD  pantoprazole sodium (PROTONIX) 40 mg/20 mL PACK Place 20 mLs (40 mg total) into feeding tube 2 (two) times daily. 04/19/20   Donne Hazel, MD  sucralfate (CARAFATE) 1 GM/10ML suspension Place 10 mLs (1 g total) into feeding tube 4 (four) times daily -  with meals and at bedtime. 04/19/20   Donne Hazel, MD  Tiotropium Bromide Monohydrate 2.5 MCG/ACT AERS Inhale 2 puffs into the lungs daily.     [provider]  traMADol (ULTRAM) 50 MG tablet Take by mouth every 12 (twelve) hours as needed (pain). Information from narcotic database    [provider]    Allergies    Azithromycin, Boniva [ibandronic acid], and Gabapentin  Review of Systems   Review of Systems  All other systems reviewed and are negative.   Physical Exam Updated Vital Signs BP (!) 161/95 (BP Location: Left Arm)   Pulse (!) 109   Temp 98.9 F (37.2 C) (Oral)   Resp 15   Ht 1.499 m (4\' 11" )   Wt 28.1 kg   SpO2 99%   BMI 12.52 kg/m   Physical Exam Vitals and nursing note reviewed.  Constitutional:      General: She is not in acute distress.    Appearance: She is  cachectic. She is not toxic-appearing.  HENT:     Head: Normocephalic and atraumatic.  Eyes:     General: Lids are normal.     Conjunctiva/sclera: Conjunctivae normal.     Pupils: Pupils are equal, round, and reactive to light.  Neck:     Thyroid: No thyroid mass.     Trachea: No tracheal deviation.  Cardiovascular:     Rate and Rhythm: Normal rate and regular rhythm.     Heart sounds: Normal heart  sounds. No murmur heard.  No gallop.   Pulmonary:     Effort: Pulmonary effort is normal. No respiratory distress.     Breath sounds: Normal breath sounds. No stridor. No decreased breath sounds, wheezing, rhonchi or rales.  Abdominal:     General: Bowel sounds are normal. There is no distension.     Palpations: Abdomen is soft.     Tenderness: There is generalized abdominal tenderness. There is no rebound.    Musculoskeletal:        General: No tenderness. Normal range of motion.     Cervical back: Normal range of motion and neck supple.  Skin:    General: Skin is warm and dry.     Findings: No abrasion or rash.  Neurological:     Mental Status: She is alert and oriented to person, place, and time.     GCS: GCS eye subscore is 4. GCS verbal subscore is 5. GCS motor subscore is 6.     Cranial Nerves: No cranial nerve deficit.     Sensory: No sensory deficit.  Psychiatric:        Speech: Speech normal.        Behavior: Behavior normal. Behavior is cooperative.     ED Results / Procedures / Treatments   Labs (all labs ordered are listed, but only abnormal results are displayed) Labs Reviewed  RESPIRATORY PANEL BY RT PCR (FLU A&B, COVID)  CBC WITH DIFFERENTIAL/PLATELET  COMPREHENSIVE METABOLIC PANEL  LIPASE, BLOOD    EKG None  Radiology No results found.  Procedures Procedures (including critical care time)  Medications Ordered in ED Medications  0.9 %  sodium chloride infusion (has no administration in time range)  sodium chloride 0.9 % bolus 500 mL (has no  administration in time range)  ondansetron (ZOFRAN) injection 4 mg (has no administration in time range)  morphine 4 MG/ML injection 4 mg (has no administration in time range)    ED Course  I have reviewed the triage vital signs and the nursing notes.  Pertinent labs & imaging results that were available during my care of the patient were reviewed by me and considered in my medical decision making (see chart for details).    MDM Rules/Calculators/A&P                          Patient with negative Covid test here.  Had abdominal flatplate which showed good placement of patient's feeding tube.  This test was ordered after consultation with the radiologist on-call.  Patient had a CT scan of abdomen which showed no signs of obstruction.  Upon further investigation, patient has had this issue vomiting for quite some time which is why she has a feeding tube.  Was given medications to control her pain and emesis here.  She will be discharged home Final Clinical Impression(s) / ED Diagnoses Final diagnoses:  None    Rx / DC Orders ED Discharge Orders    None       Lacretia Leigh, MD 04/21/20 1241

## 2020-04-23 ENCOUNTER — Telehealth: Payer: Self-pay | Admitting: Physician Assistant

## 2020-04-23 NOTE — Telephone Encounter (Signed)
Telephone note: 04/23/2020 11:12 AM  Patient's daughter called on-call line to see if patient could restart her Lyrica and Mirtazapine as she is having trouble resting at night.  These were stopped after discharge from recent hospitalization and daughter tells me that she cannot settle and cannot sleep in the evenings due to the discomfort she is having from recent G-tube placement.  After review of patient's chart I cannot figure out why these medicines were stopped.  Discussed case with Dr. Havery Moros who recommends that Dr. Fuller Plan call the patient back tomorrow and let her know about these medicines.  I did tell her it was okay to give some Benadryl today if that has helped in the past.  Also patient missed her Covid testing on Friday due to being in the ER, she did have a Covid test there.  She is asking if this is okay as she has an EGD scheduled on 10/12.  We will let Dr. Fuller Plan address this as well.  Ellouise Newer, PA-C

## 2020-04-23 NOTE — Telephone Encounter (Signed)
It's ok with me to resume Mirtazapine and Lyrica however she needs to call her PCP, or the prescribing provider(s) if it's not her PCP, to make the final decision on resuming as I am not prescribing these medications for her.   Please check if this recent Covid test is adequate to proceed.

## 2020-04-24 ENCOUNTER — Telehealth: Payer: Self-pay | Admitting: Gastroenterology

## 2020-04-24 NOTE — Telephone Encounter (Signed)
Pt's daughter Antony Salmon is requesting a call back to discuss if the pt will still be having her EGD tomorrow and she also has some medication questions

## 2020-04-24 NOTE — Telephone Encounter (Signed)
Left message for patient to call back  

## 2020-04-24 NOTE — Telephone Encounter (Signed)
Patient was tested in the ED.  I have confirmed with Endo she will not need to be retested.  Left message for patient to call back

## 2020-04-24 NOTE — Telephone Encounter (Signed)
Patient's daughter notified that patient will need to have no feedings after midnight and may have clear liquids until 8:15.

## 2020-04-24 NOTE — Telephone Encounter (Signed)
Patient's daughter notified.

## 2020-04-25 ENCOUNTER — Ambulatory Visit (HOSPITAL_COMMUNITY): Payer: No Typology Code available for payment source | Admitting: Anesthesiology

## 2020-04-25 ENCOUNTER — Other Ambulatory Visit: Payer: Self-pay

## 2020-04-25 ENCOUNTER — Encounter (HOSPITAL_COMMUNITY)
Admission: RE | Disposition: A | Discharge status: Home / Self Care | Payer: Self-pay | Source: Ambulatory Visit | Attending: Gastroenterology

## 2020-04-25 ENCOUNTER — Encounter (HOSPITAL_COMMUNITY): Payer: Self-pay | Admitting: Gastroenterology

## 2020-04-25 ENCOUNTER — Ambulatory Visit (HOSPITAL_COMMUNITY)
Admission: RE | Admit: 2020-04-25 | Discharge: 2020-04-25 | Disposition: A | Discharge status: Home / Self Care | Payer: No Typology Code available for payment source | Source: Ambulatory Visit | Attending: Gastroenterology | Admitting: Gastroenterology

## 2020-04-25 DIAGNOSIS — R1310 Dysphagia, unspecified: Secondary | ICD-10-CM | POA: Diagnosis not present

## 2020-04-25 DIAGNOSIS — J449 Chronic obstructive pulmonary disease, unspecified: Secondary | ICD-10-CM | POA: Diagnosis not present

## 2020-04-25 DIAGNOSIS — Z7951 Long term (current) use of inhaled steroids: Secondary | ICD-10-CM | POA: Diagnosis not present

## 2020-04-25 DIAGNOSIS — K2210 Ulcer of esophagus without bleeding: Secondary | ICD-10-CM | POA: Diagnosis not present

## 2020-04-25 DIAGNOSIS — K222 Esophageal obstruction: Secondary | ICD-10-CM | POA: Insufficient documentation

## 2020-04-25 DIAGNOSIS — K219 Gastro-esophageal reflux disease without esophagitis: Secondary | ICD-10-CM | POA: Insufficient documentation

## 2020-04-25 DIAGNOSIS — K269 Duodenal ulcer, unspecified as acute or chronic, without hemorrhage or perforation: Secondary | ICD-10-CM | POA: Insufficient documentation

## 2020-04-25 DIAGNOSIS — Z931 Gastrostomy status: Secondary | ICD-10-CM | POA: Diagnosis not present

## 2020-04-25 DIAGNOSIS — M810 Age-related osteoporosis without current pathological fracture: Secondary | ICD-10-CM | POA: Insufficient documentation

## 2020-04-25 DIAGNOSIS — I10 Essential (primary) hypertension: Secondary | ICD-10-CM | POA: Insufficient documentation

## 2020-04-25 DIAGNOSIS — Z888 Allergy status to other drugs, medicaments and biological substances status: Secondary | ICD-10-CM | POA: Insufficient documentation

## 2020-04-25 DIAGNOSIS — M1990 Unspecified osteoarthritis, unspecified site: Secondary | ICD-10-CM | POA: Insufficient documentation

## 2020-04-25 DIAGNOSIS — Z800 Family history of malignant neoplasm of digestive organs: Secondary | ICD-10-CM | POA: Diagnosis not present

## 2020-04-25 DIAGNOSIS — Z881 Allergy status to other antibiotic agents status: Secondary | ICD-10-CM | POA: Diagnosis not present

## 2020-04-25 DIAGNOSIS — Z8249 Family history of ischemic heart disease and other diseases of the circulatory system: Secondary | ICD-10-CM | POA: Insufficient documentation

## 2020-04-25 DIAGNOSIS — Z85118 Personal history of other malignant neoplasm of bronchus and lung: Secondary | ICD-10-CM | POA: Insufficient documentation

## 2020-04-25 DIAGNOSIS — Z87891 Personal history of nicotine dependence: Secondary | ICD-10-CM | POA: Insufficient documentation

## 2020-04-25 DIAGNOSIS — R634 Abnormal weight loss: Secondary | ICD-10-CM | POA: Insufficient documentation

## 2020-04-25 DIAGNOSIS — Z902 Acquired absence of lung [part of]: Secondary | ICD-10-CM | POA: Diagnosis not present

## 2020-04-25 DIAGNOSIS — K449 Diaphragmatic hernia without obstruction or gangrene: Secondary | ICD-10-CM | POA: Insufficient documentation

## 2020-04-25 DIAGNOSIS — Z90710 Acquired absence of both cervix and uterus: Secondary | ICD-10-CM | POA: Diagnosis not present

## 2020-04-25 DIAGNOSIS — Z79899 Other long term (current) drug therapy: Secondary | ICD-10-CM | POA: Diagnosis not present

## 2020-04-25 DIAGNOSIS — Z808 Family history of malignant neoplasm of other organs or systems: Secondary | ICD-10-CM | POA: Insufficient documentation

## 2020-04-25 HISTORY — PX: SAVORY DILATION: SHX5439

## 2020-04-25 HISTORY — PX: ESOPHAGOGASTRODUODENOSCOPY (EGD) WITH PROPOFOL: SHX5813

## 2020-04-25 SURGERY — ESOPHAGOGASTRODUODENOSCOPY (EGD) WITH PROPOFOL
Anesthesia: Monitor Anesthesia Care

## 2020-04-25 MED ORDER — PROPOFOL 500 MG/50ML IV EMUL
INTRAVENOUS | Status: DC | PRN
  Administered 2020-04-25: 140 ug/kg/min via INTRAVENOUS

## 2020-04-25 MED ORDER — SODIUM CHLORIDE 0.9 % IV SOLN: INTRAVENOUS | Status: DC

## 2020-04-25 MED ORDER — PHENYLEPHRINE 40 MCG/ML (10ML) SYRINGE FOR IV PUSH (FOR BLOOD PRESSURE SUPPORT)
PREFILLED_SYRINGE | INTRAVENOUS | Status: DC | PRN
  Administered 2020-04-25: 80 ug via INTRAVENOUS

## 2020-04-25 MED ORDER — LIDOCAINE HCL (CARDIAC) PF 100 MG/5ML IV SOSY
PREFILLED_SYRINGE | INTRAVENOUS | Status: DC | PRN
  Administered 2020-04-25: 25 mg via INTRATRACHEAL

## 2020-04-25 MED ORDER — ONDANSETRON HCL 4 MG/2ML IJ SOLN
INTRAMUSCULAR | Status: DC | PRN
  Administered 2020-04-25: 4 mg via INTRAVENOUS

## 2020-04-25 MED ORDER — LACTATED RINGERS IV SOLN: INTRAVENOUS | Status: DC | PRN

## 2020-04-25 MED ORDER — PROPOFOL 500 MG/50ML IV EMUL
INTRAVENOUS | Status: DC | PRN
  Administered 2020-04-25 (×3): 10 mg via INTRAVENOUS

## 2020-04-25 MED ORDER — PROPOFOL 10 MG/ML IV BOLUS
INTRAVENOUS | Status: AC
  Filled 2020-04-25: qty 20

## 2020-04-25 SURGICAL SUPPLY — 14 items

## 2020-04-25 NOTE — Op Note (Signed)
[facility] Patient Name: Claudia Wiley Procedure Date: 04/25/2020 MRN: [id number] Attending MD: [name] , MD Date of Birth: 1956-05-07 CSN: [id number] Age: 64 Admit Type: Outpatient Procedure:                Upper GI endoscopy Indications:              Therapeutic procedure for esophageal stricture                            dilation, Dysphagia Providers:                Pricilla Riffle. Fuller Plan, MD, Cleda Daub, RN, Janee Morn, Technician Referring MD:             [facility] Medicines:                Monitored Anesthesia Care Complications:            No immediate complications. Estimated Blood Loss:     Estimated blood loss was minimal. Procedure:                Pre-Anesthesia Assessment:                           - Prior to the procedure, a History and Physical                            was performed, and patient medications and                            allergies were reviewed. The patient's tolerance of                            previous anesthesia was also reviewed. The risks                            and benefits of the procedure and the sedation                            options and risks were discussed with the patient.                            All questions were answered, and informed consent                            was obtained. Prior Anticoagulants: The patient has                            taken no previous anticoagulant or antiplatelet                            agents. ASA Grade Assessment: III - A patient with  severe systemic disease. After reviewing the risks                            and benefits, the patient was deemed in                            satisfactory condition to undergo the procedure.                           After obtaining informed consent, the endoscope was                            passed under direct vision. Throughout the                             procedure, the patient's blood pressure, pulse, and                            oxygen saturations were monitored continuously. The                            GIF-H190 ([id number]) Olympus gastroscope was                            introduced through the mouth, and advanced to the                            second part of duodenum. The upper GI endoscopy was                            accomplished without difficulty. The patient                            tolerated the procedure well. Scope In: Scope Out: Findings:      Multiple benign-appearing, intrinsic moderate stenoses were found 30 to       37 cm from the incisors. The narrowest stenosis measured 1 cm (inner       diameter) x less than one cm (in length). The stenoses were traversed. A       guidewire was placed and the scope was withdrawn. Dilations were       performed with Savary dilators with mild resistance at 12.8 mm, 14 mm       and 15 mm.      One superficial esophageal ulcer with no bleeding and no stigmata of       recent bleeding was found in the distal esophagus. The lesion was 10 mm       in largest dimension.      The exam of the esophagus was otherwise normal.      A small hiatal hernia was present.      There was evidence of an intact gastrostomy with a patent G-J tube       present on the anterior wall of the stomach extending into the duodenum.      The exam of the stomach was otherwise normal.      Three non-bleeding cratered duodenal ulcers with  no stigmata of bleeding       were found in the duodenal bulb. The largest lesion was 6 mm in largest       dimension.      The second portion of the duodenum was normal. Impression:               - Multiple benign-appearing esophageal stenoses.                            Dilated.                           - Esophageal ulcer with no bleeding and no stigmata                            of recent bleeding.                           - Small hiatal hernia.                            - Intact gastrostomy with a patent G-J tube present.                           - Non-bleeding duodenal ulcers with no stigmata of                            bleeding.                           - Normal second portion of the duodenum.                           - No specimens collected. Moderate Sedation:      Not Applicable - Patient had care per Anesthesia. Recommendation:           - Patient has a contact number available for                            emergencies. The signs and symptoms of potential                            delayed complications were discussed with the                            patient. Return to normal activities tomorrow.                            Written discharge instructions were provided to the                            patient.                           - Clear liquid diet for 2 hours, then advance as  tolerated to soft diet today.                           - Continue present medications including                            pantoprazole 40 mg po bid long term.                           - Return to GI office in 1 month with me or APP.                           - No aspirin, ibuprofen, naproxen, or other                            non-steroidal anti-inflammatory drugs. Procedure Code(s):        --- Professional ---                           [phone], Esophagogastroduodenoscopy, flexible,                            transoral; with insertion of guide wire followed by                            passage of dilator(s) through esophagus over guide                            wire Diagnosis Code(s):        --- Professional ---                           K22.2, Esophageal obstruction                           K22.10, Ulcer of esophagus without bleeding                           K44.9, Diaphragmatic hernia without obstruction or                            gangrene                           Z93.1, Gastrostomy status                            K26.9, Duodenal ulcer, unspecified as acute or                            chronic, without hemorrhage or perforation                           R13.10, Dysphagia, unspecified CPT copyright 2019 [employer]. All rights reserved. The codes documented in this report are preliminary and upon coder review may  be revised to meet current compliance requirements. [name], MD  04/25/2020 11:49:32 AM This report has been signed electronically. Number of Addenda: 0

## 2020-04-25 NOTE — Discharge Instructions (Signed)
YOU HAD AN ENDOSCOPIC PROCEDURE TODAY: Refer to the procedure report and other information in the discharge instructions given to you for any specific questions about what was found during the examination. If this information does not answer your questions, please call [place] office at [phone] to clarify.   YOU SHOULD EXPECT: Some feelings of bloating in the abdomen. Passage of more gas than usual. Walking can help get rid of the air that was put into your GI tract during the procedure and reduce the bloating. If you had a lower endoscopy (such as a colonoscopy or flexible sigmoidoscopy) you may notice spotting of blood in your stool or on the toilet paper. Some abdominal soreness may be present for a day or two, also.  DIET: Your first meal following the procedure should be a light meal and then it is ok to progress to your normal diet. A half-sandwich or bowl of soup is an example of a good first meal. Heavy or fried foods are harder to digest and may make you feel nauseous or bloated. Drink plenty of fluids but you should avoid alcoholic beverages for 24 hours. If you had a esophageal dilation, please see attached instructions for diet.    ACTIVITY: Your care partner should take you home directly after the procedure. You should plan to take it easy, moving slowly for the rest of the day. You can resume normal activity the day after the procedure however YOU SHOULD NOT DRIVE, use power tools, machinery or perform tasks that involve climbing or major physical exertion for 24 hours (because of the sedation medicines used during the test).   SYMPTOMS TO REPORT IMMEDIATELY: A gastroenterologist can be reached at any hour. Please call [phone]  for any of the following symptoms:   Following upper endoscopy (EGD, EUS, ERCP, esophageal dilation) Vomiting of blood or coffee ground material  New, significant abdominal pain  New, significant chest pain or pain under the shoulder blades  Painful or  persistently difficult swallowing  New shortness of breath  Black, tarry-looking or red, bloody stools  FOLLOW UP:  If any biopsies were taken you will be contacted by phone or by letter within the next 1-3 weeks. Call [phone]  if you have not heard about the biopsies in 3 weeks.  Please also call with any specific questions about appointments or follow up tests.  

## 2020-04-25 NOTE — Transfer of Care (Signed)
Immediate Anesthesia Transfer of Care Note  Patient: Claudia Wiley  Procedure(s) Performed: ESOPHAGOGASTRODUODENOSCOPY (EGD) WITH PROPOFOL (N/A ) SAVORY DILATION (N/A )  Patient Location: Endoscopy Unit  Anesthesia Type:MAC  Level of Consciousness: awake, oriented, drowsy, patient cooperative and responds to stimulation  Airway & Oxygen Therapy: Patient Spontanous Breathing and Patient connected to face mask oxygen  Post-op Assessment: Report given to RN and Post -op Vital signs reviewed and stable  Post vital signs: Reviewed and stable  Last Vitals:  Vitals Value Taken Time  BP    Temp    Pulse 89 04/25/20 1150  Resp 25 04/25/20 1150  SpO2 100 % 04/25/20 1150  Vitals shown include unvalidated device data.  Last Pain:  Vitals:   04/25/20 1044  TempSrc: Oral  PainSc: 4          Complications: No complications documented.

## 2020-04-25 NOTE — Anesthesia Preprocedure Evaluation (Addendum)
Anesthesia Evaluation  Patient identified by MRN, date of birth, ID band Patient awake    Reviewed: Allergy & Precautions, NPO status , Patient's Chart, lab work & pertinent test results  History of Anesthesia Complications (+) PONV and history of anesthetic complications  Airway Mallampati: I  TM Distance: >3 FB Neck ROM: Full    Dental  (+) Edentulous Upper, Edentulous Lower, Dental Advisory Given   Pulmonary COPD,  oxygen dependent, former smoker,    Pulmonary exam normal        Cardiovascular hypertension, Normal cardiovascular exam     Neuro/Psych negative neurological ROS  negative psych ROS   GI/Hepatic Neg liver ROS, GERD  ,Severe weight loss Malnutrition   Endo/Other  negative endocrine ROS  Renal/GU negative Renal ROS  negative genitourinary   Musculoskeletal negative musculoskeletal ROS (+)   Abdominal   Peds negative pediatric ROS (+)  Hematology negative hematology ROS (+)   Anesthesia Other Findings   Reproductive/Obstetrics negative OB ROS                            Anesthesia Physical  Anesthesia Plan  ASA: III  Anesthesia Plan: MAC   Post-op Pain Management:    Induction: Intravenous  PONV Risk Score and Plan: 0 and Ondansetron and Propofol infusion  Airway Management Planned: Simple Face Mask  Additional Equipment:   Intra-op Plan:   Post-operative Plan:   Informed Consent: I have reviewed the patients History and Physical, chart, labs and discussed the procedure including the risks, benefits and alternatives for the proposed anesthesia with the patient or authorized representative who has indicated his/her understanding and acceptance.     Dental advisory given  Plan Discussed with: CRNA, Surgeon and Anesthesiologist  Anesthesia Plan Comments:        Anesthesia Quick Evaluation

## 2020-04-25 NOTE — Anesthesia Postprocedure Evaluation (Signed)
Anesthesia Post Note  Patient: Claudia Wiley  Procedure(s) Performed: ESOPHAGOGASTRODUODENOSCOPY (EGD) WITH PROPOFOL (N/A ) SAVORY DILATION (N/A )     Patient location during evaluation: Endoscopy Anesthesia Type: MAC Level of consciousness: awake and alert Pain management: pain level controlled Vital Signs Assessment: post-procedure vital signs reviewed and stable Respiratory status: spontaneous breathing and respiratory function stable Cardiovascular status: stable Postop Assessment: no apparent nausea or vomiting Anesthetic complications: no   No complications documented.  Last Vitals:  Vitals:   04/25/20 1200 04/25/20 1210  BP: 116/65 131/68  Pulse: 76 74  Resp: 17 17  Temp:    SpO2: 100% 100%    Last Pain:  Vitals:   04/25/20 1210  TempSrc:   PainSc: 0-No pain                 Kimberley Speece DANIEL

## 2020-04-25 NOTE — Interval H&P Note (Signed)
History and Physical Interval Note:  04/25/2020 10:16 AM  Claudia Wiley  has presented today for surgery, with the diagnosis of esoph stricture.  The various methods of treatment have been discussed with the patient and family. After consideration of risks, benefits and other options for treatment, the patient has consented to  Procedure(s): ESOPHAGOGASTRODUODENOSCOPY (EGD) WITH PROPOFOL (N/A) as a surgical intervention.  The patient's history has been reviewed, patient examined, no change in status, stable for surgery.  I have reviewed the patient's chart and labs.  Questions were answered to the patient's satisfaction.     Pricilla Riffle. Fuller Plan

## 2020-04-26 ENCOUNTER — Encounter (HOSPITAL_COMMUNITY): Payer: Self-pay | Admitting: Gastroenterology

## 2020-04-30 ENCOUNTER — Telehealth: Payer: Self-pay | Admitting: Internal Medicine

## 2020-04-30 NOTE — Telephone Encounter (Signed)
Called by patient's daughter. J tube is clogged.  G tube was converted to a GJ 04/18/20 by IR We tried flushing and instilling Pepsi, but tube remains clogged completely. She is able to stay hydrated PO, but cannot get TF without functioning tube  I called IR today and spoke with Dr. Serafina Royals.  He recommended contacting radiology in the morning.  He felt someone in IR should be able to see her tomorrow to help unclog the Jtube.  I instructed the patient's daughter to call [facility] IR in the morning to make them aware of the need for help with J tube.  Sheri, can you please ensure IR is notified so patient can be seen tomorrow to help unclog the recently placed J tube.  Thanks [employer]

## 2020-05-01 ENCOUNTER — Telehealth: Payer: Self-pay | Admitting: Internal Medicine

## 2020-05-01 ENCOUNTER — Other Ambulatory Visit (HOSPITAL_COMMUNITY): Payer: No Typology Code available for payment source

## 2020-05-01 ENCOUNTER — Other Ambulatory Visit (HOSPITAL_COMMUNITY): Payer: Self-pay | Admitting: Radiology

## 2020-05-01 DIAGNOSIS — K222 Esophageal obstruction: Secondary | ICD-10-CM

## 2020-05-01 NOTE — Telephone Encounter (Signed)
I spoke with Tiffany in IR. They are aware of the problem with G-tube and are having Lennette Bihari contact patient and arrange appt.

## 2020-05-01 NOTE — Telephone Encounter (Signed)
Please make then aware they should contact IR directly with GJ tube problems.

## 2020-05-02 ENCOUNTER — Other Ambulatory Visit (HOSPITAL_COMMUNITY): Payer: No Typology Code available for payment source

## 2020-05-02 ENCOUNTER — Ambulatory Visit: Payer: Medicaid Other | Admitting: Gastroenterology

## 2020-05-02 ENCOUNTER — Other Ambulatory Visit (HOSPITAL_COMMUNITY): Payer: Self-pay | Admitting: Radiology

## 2020-05-02 DIAGNOSIS — Z789 Other specified health status: Secondary | ICD-10-CM

## 2020-05-03 ENCOUNTER — Other Ambulatory Visit: Payer: Self-pay

## 2020-05-03 ENCOUNTER — Ambulatory Visit (HOSPITAL_COMMUNITY)
Admission: RE | Admit: 2020-05-03 | Discharge: 2020-05-03 | Disposition: A | Discharge status: Home / Self Care | Payer: No Typology Code available for payment source | Source: Ambulatory Visit | Attending: Radiology | Admitting: Radiology

## 2020-05-03 DIAGNOSIS — Z789 Other specified health status: Secondary | ICD-10-CM

## 2020-05-03 DIAGNOSIS — Z434 Encounter for attention to other artificial openings of digestive tract: Secondary | ICD-10-CM | POA: Insufficient documentation

## 2020-05-03 HISTORY — PX: IR RADIOLOGIST EVAL & MGMT: IMG5224

## 2020-05-03 MED ORDER — LIDOCAINE VISCOUS HCL 2 % MT SOLN: 15.0000 mL | Freq: Once | OROMUCOSAL | Status: AC

## 2020-05-03 MED ORDER — LIDOCAINE VISCOUS HCL 2 % MT SOLN
OROMUCOSAL | Status: AC
  Administered 2020-05-03: 5 mL via ORAL
  Filled 2020-05-03: qty 15

## 2020-05-24 ENCOUNTER — Encounter: Payer: Self-pay | Admitting: Gastroenterology

## 2020-05-24 ENCOUNTER — Ambulatory Visit (INDEPENDENT_AMBULATORY_CARE_PROVIDER_SITE_OTHER): Payer: No Typology Code available for payment source | Admitting: Gastroenterology

## 2020-05-24 VITALS — BP 100/70 | HR 107 | Ht 59.0 in | Wt <= 1120 oz

## 2020-05-24 DIAGNOSIS — K222 Esophageal obstruction: Secondary | ICD-10-CM

## 2020-05-24 DIAGNOSIS — K551 Chronic vascular disorders of intestine: Secondary | ICD-10-CM

## 2020-05-24 DIAGNOSIS — E440 Moderate protein-calorie malnutrition: Secondary | ICD-10-CM

## 2020-05-24 DIAGNOSIS — R1310 Dysphagia, unspecified: Secondary | ICD-10-CM | POA: Diagnosis not present

## 2020-05-24 NOTE — Patient Instructions (Signed)
Please follow up with your primary care physician, Dr. Daneil Dolin at the [facility].   You can call [facility] main number and ask for the interventional radiology department for ongoing care with g-tube.   Thank you for choosing me and [facility].  Pricilla Riffle. Dagoberto Ligas., MD., Mar[facility] Regal

## 2020-05-24 NOTE — Progress Notes (Signed)
    History of Present Illness: This is a 64 year old woman returning for follow-up of esophageal strictures, SMA syndrome and malnutrition. She is accompanied by her daughter who has been very closely involved in her care. She is tolerating Osmolite tube feedings and has had a slight weight gain. She has not had any episodes of nausea and vomiting in the past few weeks. She is able to swallow pantoprazole twice daily in pudding.  EGD 04/25/2020 - Multiple benign-appearing esophageal stenoses,1 cm in diameter. Dilated to 15 mm. - Esophageal ulcer with no bleeding and no stigmata of recent bleeding. - Small hiatal hernia. - Intact gastrostomy with a patent G-J tube present. - Non-bleeding duodenal ulcers with no stigmata of bleeding. - Normal second portion of the duodenum. - No specimens collected.  Current Medications, Allergies, Past Medical History, Past Surgical History, Family History and Social History were reviewed in [employer] record.   Physical Exam: General: Well developed, well nourished, no acute distress Head: Normocephalic and atraumatic Eyes:  sclerae anicteric, EOMI Ears: Normal auditory acuity Mouth: Not examined, mask on during Covid-19 pandemic Lungs: Clear throughout to auscultation Heart: Regular rate and rhythm; no murmurs, rubs or bruits Abdomen: Soft, non tender and non distended. No masses, hepatosplenomegaly or hernias noted. Normal Bowel sounds Rectal: Not done Musculoskeletal: Symmetrical with no gross deformities  Pulses:  Normal pulses noted Extremities: No clubbing, cyanosis, edema or deformities noted Neurological: Alert oriented x 4, grossly nonfocal Psychological:  Alert and cooperative. Normal mood and affect   Assessment and Recommendations:  1. Esophageal strictures from severe esophagitis. At this time she has received maximal benefit from esophageal dilations and her esophageal strictures are substantially improved but  still present. They have not improved beyond 1 cm with the last few dilations. Maintain pantoprazole 40 mg po bid with pudding, yogurt long term. Discontinue Carafate and resume if helpful for dyspepsia, GERD. Long term soft diet. Avoid bread, chicken, steak and any other foods that are difficult to swallow long term.  Patient is released to her [place] PCP and GI physician for ongoing care.   2. Malnutrition. Interventional Radiology placed G-J tube in place on continuous Osmolite 1.5 feedings and slowly gaining weight. Patient is released to her [place] PCP for ongoing care.   3. 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 IR placed GJ tube in place. Hopefully will improve or resolve with weight gain. Target weight of 80 lbs. Her highest adult weight was 89 lbs. Patient is released to her [place] PCP and GI physician for ongoing care.

## 2020-08-03 ENCOUNTER — Inpatient Hospital Stay (HOSPITAL_COMMUNITY)
Admission: EM | Admit: 2020-08-03 | Discharge: 2020-08-06 | DRG: 177 | Disposition: A | Discharge status: Home Health | Payer: No Typology Code available for payment source | Attending: Family Medicine | Admitting: Family Medicine

## 2020-08-03 ENCOUNTER — Other Ambulatory Visit: Payer: Self-pay

## 2020-08-03 ENCOUNTER — Encounter (HOSPITAL_COMMUNITY): Payer: Self-pay | Admitting: Emergency Medicine

## 2020-08-03 ENCOUNTER — Emergency Department (HOSPITAL_COMMUNITY): Payer: No Typology Code available for payment source

## 2020-08-03 DIAGNOSIS — Z79899 Other long term (current) drug therapy: Secondary | ICD-10-CM

## 2020-08-03 DIAGNOSIS — U071 COVID-19: Principal | ICD-10-CM | POA: Diagnosis present

## 2020-08-03 DIAGNOSIS — E43 Unspecified severe protein-calorie malnutrition: Secondary | ICD-10-CM | POA: Diagnosis present

## 2020-08-03 DIAGNOSIS — J309 Allergic rhinitis, unspecified: Secondary | ICD-10-CM | POA: Diagnosis present

## 2020-08-03 DIAGNOSIS — R7303 Prediabetes: Secondary | ICD-10-CM | POA: Diagnosis present

## 2020-08-03 DIAGNOSIS — Z808 Family history of malignant neoplasm of other organs or systems: Secondary | ICD-10-CM

## 2020-08-03 DIAGNOSIS — I10 Essential (primary) hypertension: Secondary | ICD-10-CM | POA: Diagnosis present

## 2020-08-03 DIAGNOSIS — R64 Cachexia: Secondary | ICD-10-CM | POA: Diagnosis present

## 2020-08-03 DIAGNOSIS — Z8249 Family history of ischemic heart disease and other diseases of the circulatory system: Secondary | ICD-10-CM

## 2020-08-03 DIAGNOSIS — Z888 Allergy status to other drugs, medicaments and biological substances status: Secondary | ICD-10-CM

## 2020-08-03 DIAGNOSIS — Z902 Acquired absence of lung [part of]: Secondary | ICD-10-CM

## 2020-08-03 DIAGNOSIS — Z681 Body mass index (BMI) 19 or less, adult: Secondary | ICD-10-CM

## 2020-08-03 DIAGNOSIS — Z7951 Long term (current) use of inhaled steroids: Secondary | ICD-10-CM

## 2020-08-03 DIAGNOSIS — K222 Esophageal obstruction: Secondary | ICD-10-CM | POA: Diagnosis present

## 2020-08-03 DIAGNOSIS — Z9981 Dependence on supplemental oxygen: Secondary | ICD-10-CM

## 2020-08-03 DIAGNOSIS — Z87891 Personal history of nicotine dependence: Secondary | ICD-10-CM

## 2020-08-03 DIAGNOSIS — M7918 Myalgia, other site: Secondary | ICD-10-CM | POA: Diagnosis present

## 2020-08-03 DIAGNOSIS — K551 Chronic vascular disorders of intestine: Secondary | ICD-10-CM | POA: Diagnosis present

## 2020-08-03 DIAGNOSIS — J441 Chronic obstructive pulmonary disease with (acute) exacerbation: Secondary | ICD-10-CM

## 2020-08-03 DIAGNOSIS — Z85028 Personal history of other malignant neoplasm of stomach: Secondary | ICD-10-CM

## 2020-08-03 DIAGNOSIS — Z881 Allergy status to other antibiotic agents status: Secondary | ICD-10-CM

## 2020-08-03 DIAGNOSIS — K315 Obstruction of duodenum: Secondary | ICD-10-CM | POA: Diagnosis present

## 2020-08-03 DIAGNOSIS — Z90710 Acquired absence of both cervix and uterus: Secondary | ICD-10-CM

## 2020-08-03 DIAGNOSIS — R739 Hyperglycemia, unspecified: Secondary | ICD-10-CM | POA: Diagnosis present

## 2020-08-03 DIAGNOSIS — Z833 Family history of diabetes mellitus: Secondary | ICD-10-CM

## 2020-08-03 DIAGNOSIS — Z85118 Personal history of other malignant neoplasm of bronchus and lung: Secondary | ICD-10-CM

## 2020-08-03 DIAGNOSIS — Z934 Other artificial openings of gastrointestinal tract status: Secondary | ICD-10-CM

## 2020-08-03 DIAGNOSIS — K2100 Gastro-esophageal reflux disease with esophagitis, without bleeding: Secondary | ICD-10-CM | POA: Diagnosis present

## 2020-08-03 LAB — CBC WITH DIFFERENTIAL/PLATELET
Abs Immature Granulocytes: 0.03 10*3/uL (ref 0.00–0.07)
Basophils Absolute: 0 10*3/uL (ref 0.0–0.1)
Basophils Relative: 0 %
Eosinophils Absolute: 0.1 10*3/uL (ref 0.0–0.5)
Eosinophils Relative: 1 %
HCT: 47.2 % — ABNORMAL HIGH (ref 36.0–46.0)
Hemoglobin: 14 g/dL (ref 12.0–15.0)
Immature Granulocytes: 0 %
Lymphocytes Relative: 39 %
Lymphs Abs: 3.2 10*3/uL (ref 0.7–4.0)
MCH: 26.2 pg (ref 26.0–34.0)
MCHC: 29.7 g/dL — ABNORMAL LOW (ref 30.0–36.0)
MCV: 88.4 fL (ref 80.0–100.0)
Monocytes Absolute: 0.8 10*3/uL (ref 0.1–1.0)
Monocytes Relative: 10 %
Neutro Abs: 4.2 10*3/uL (ref 1.7–7.7)
Neutrophils Relative %: 50 %
Platelets: 372 10*3/uL (ref 150–400)
RBC: 5.34 MIL/uL — ABNORMAL HIGH (ref 3.87–5.11)
RDW: 15.3 % (ref 11.5–15.5)
WBC: 8.4 10*3/uL (ref 4.0–10.5)
nRBC: 0 % (ref 0.0–0.2)

## 2020-08-03 LAB — COMPREHENSIVE METABOLIC PANEL
ALT: 16 U/L (ref 0–44)
AST: 26 U/L (ref 15–41)
Albumin: 3.4 g/dL — ABNORMAL LOW (ref 3.5–5.0)
Alkaline Phosphatase: 89 U/L (ref 38–126)
Anion gap: 13 (ref 5–15)
BUN: 15 mg/dL (ref 8–23)
CO2: 30 mmol/L (ref 22–32)
Calcium: 9.1 mg/dL (ref 8.9–10.3)
Chloride: 95 mmol/L — ABNORMAL LOW (ref 98–111)
Creatinine, Ser: 0.48 mg/dL (ref 0.44–1.00)
GFR, Estimated: >60 mL/min (ref >60)
Glucose, Bld: 180 mg/dL — ABNORMAL HIGH (ref 70–99)
Potassium: 4.1 mmol/L (ref 3.5–5.1)
Sodium: 138 mmol/L (ref 135–145)
Total Bilirubin: 0.3 mg/dL (ref 0.3–1.2)
Total Protein: 6.4 g/dL — ABNORMAL LOW (ref 6.5–8.1)

## 2020-08-03 LAB — TROPONIN I (HIGH SENSITIVITY)
Troponin I (High Sensitivity): 3 ng/L (ref <18)
Troponin I (High Sensitivity): 3 ng/L (ref <18)

## 2020-08-03 LAB — SARS CORONAVIRUS 2 BY RT PCR (HOSPITAL ORDER, PERFORMED IN ~~LOC~~ HOSPITAL LAB): SARS Coronavirus 2: POSITIVE — AB

## 2020-08-03 LAB — HEMOGLOBIN A1C
Hgb A1c MFr Bld: 5.9 % — ABNORMAL HIGH (ref 4.8–5.6)
Mean Plasma Glucose: 122.63 mg/dL

## 2020-08-03 LAB — BRAIN NATRIURETIC PEPTIDE: B Natriuretic Peptide: 10.6 pg/mL (ref 0.0–100.0)

## 2020-08-03 LAB — LIPASE, BLOOD: Lipase: 20 U/L (ref 11–51)

## 2020-08-03 MED ORDER — IPRATROPIUM-ALBUTEROL 0.5-2.5 (3) MG/3ML IN SOLN: 3.0000 mL | RESPIRATORY_TRACT | Status: DC | PRN

## 2020-08-03 MED ORDER — ENSURE ENLIVE PO LIQD: 237.0000 mL | Freq: Every day | ORAL | Status: DC

## 2020-08-03 MED ORDER — ALBUTEROL SULFATE HFA 108 (90 BASE) MCG/ACT IN AERS
1.0000 | INHALATION_SPRAY | RESPIRATORY_TRACT | Status: DC | PRN
  Administered 2020-08-04: 2 via RESPIRATORY_TRACT
  Filled 2020-08-03: qty 6.7

## 2020-08-03 MED ORDER — ACETAMINOPHEN 160 MG/5ML PO SOLN
650.0000 mg | Freq: Once | ORAL | Status: AC
  Administered 2020-08-03: 650 mg via ORAL
  Filled 2020-08-03: qty 20.3

## 2020-08-03 MED ORDER — LACTATED RINGERS IV BOLUS
500.0000 mL | Freq: Once | INTRAVENOUS | Status: AC
  Administered 2020-08-03: 500 mL via INTRAVENOUS

## 2020-08-03 MED ORDER — MIRTAZAPINE 15 MG PO TABS
15.0000 mg | ORAL_TABLET | Freq: Every day | ORAL | Status: DC
  Administered 2020-08-03 – 2020-08-05 (×3): 15 mg via ORAL
  Filled 2020-08-03 (×3): qty 1

## 2020-08-03 MED ORDER — FLUTICASONE PROPIONATE 50 MCG/ACT NA SUSP
1.0000 | Freq: Every day | NASAL | Status: DC | PRN
  Filled 2020-08-03: qty 16

## 2020-08-03 MED ORDER — PANTOPRAZOLE SODIUM 40 MG PO PACK
40.0000 mg | PACK | Freq: Two times a day (BID) | ORAL | Status: DC
  Administered 2020-08-03 – 2020-08-06 (×6): 40 mg
  Filled 2020-08-03 (×6): qty 20

## 2020-08-03 MED ORDER — HYDROCODONE-ACETAMINOPHEN 7.5-325 MG/15ML PO SOLN
10.0000 mL | Freq: Once | ORAL | Status: AC
  Administered 2020-08-03: 10 mL via ORAL
  Filled 2020-08-03: qty 15

## 2020-08-03 MED ORDER — ACETAMINOPHEN 160 MG/5ML PO SOLN
650.0000 mg | Freq: Four times a day (QID) | ORAL | Status: DC | PRN
  Administered 2020-08-04: 650 mg
  Filled 2020-08-03: qty 20.3

## 2020-08-03 MED ORDER — HYDROCODONE-ACETAMINOPHEN 5-325 MG PO TABS
1.0000 | ORAL_TABLET | Freq: Once | ORAL | Status: DC
Start: 2020-08-03 — End: 2020-08-03

## 2020-08-03 MED ORDER — FLUTICASONE FUROATE-VILANTEROL 200-25 MCG/INH IN AEPB
1.0000 | INHALATION_SPRAY | Freq: Every day | RESPIRATORY_TRACT | Status: DC
  Administered 2020-08-04 – 2020-08-06 (×3): 1 via RESPIRATORY_TRACT
  Filled 2020-08-03: qty 28

## 2020-08-03 MED ORDER — TRAMADOL HCL 50 MG PO TABS
50.0000 mg | ORAL_TABLET | Freq: Two times a day (BID) | ORAL | Status: DC | PRN
  Administered 2020-08-03 – 2020-08-04 (×3): 50 mg via ORAL
  Filled 2020-08-03 (×3): qty 1

## 2020-08-03 MED ORDER — SODIUM CHLORIDE 0.9 % IV SOLN
100.0000 mg | Freq: Every day | INTRAVENOUS | Status: DC
  Administered 2020-08-04 – 2020-08-06 (×3): 100 mg via INTRAVENOUS
  Filled 2020-08-03 (×3): qty 20

## 2020-08-03 MED ORDER — TIOTROPIUM BROMIDE MONOHYDRATE 2.5 MCG/ACT IN AERS: 2.0000 | INHALATION_SPRAY | Freq: Every day | RESPIRATORY_TRACT | Status: DC

## 2020-08-03 MED ORDER — SUCRALFATE 1 GM/10ML PO SUSP
1.0000 g | Freq: Three times a day (TID) | ORAL | Status: DC
  Administered 2020-08-03 – 2020-08-06 (×10): 1 g
  Filled 2020-08-03 (×8): qty 10

## 2020-08-03 MED ORDER — UMECLIDINIUM BROMIDE 62.5 MCG/INH IN AEPB
1.0000 | INHALATION_SPRAY | Freq: Every day | RESPIRATORY_TRACT | Status: DC
  Administered 2020-08-04 – 2020-08-06 (×3): 1 via RESPIRATORY_TRACT
  Filled 2020-08-03: qty 7

## 2020-08-03 MED ORDER — ENOXAPARIN SODIUM 30 MG/0.3ML ~~LOC~~ SOLN
30.0000 mg | SUBCUTANEOUS | Status: DC
  Administered 2020-08-03 – 2020-08-05 (×3): 30 mg via SUBCUTANEOUS
  Filled 2020-08-03 (×3): qty 0.3

## 2020-08-03 MED ORDER — ONDANSETRON HCL 4 MG/2ML IJ SOLN
4.0000 mg | Freq: Once | INTRAMUSCULAR | Status: AC
  Administered 2020-08-03: 4 mg via INTRAVENOUS
  Filled 2020-08-03: qty 2

## 2020-08-03 MED ORDER — MORPHINE SULFATE (PF) 4 MG/ML IV SOLN
4.0000 mg | Freq: Once | INTRAVENOUS | Status: AC
  Administered 2020-08-03: 4 mg via INTRAVENOUS
  Filled 2020-08-03: qty 1

## 2020-08-03 MED ORDER — PREGABALIN 75 MG PO CAPS
150.0000 mg | ORAL_CAPSULE | Freq: Every day | ORAL | Status: DC
  Administered 2020-08-03 – 2020-08-05 (×3): 150 mg via ORAL
  Filled 2020-08-03 (×3): qty 2

## 2020-08-03 MED ORDER — SODIUM CHLORIDE 0.9 % IV SOLN
200.0000 mg | Freq: Once | INTRAVENOUS | Status: AC
  Administered 2020-08-03: 200 mg via INTRAVENOUS
  Filled 2020-08-03: qty 40

## 2020-08-03 MED ORDER — ACETAMINOPHEN 325 MG PO TABS: 650.0000 mg | ORAL_TABLET | Freq: Four times a day (QID) | ORAL | Status: DC | PRN

## 2020-08-03 MED ORDER — ALBUTEROL SULFATE HFA 108 (90 BASE) MCG/ACT IN AERS
2.0000 | INHALATION_SPRAY | RESPIRATORY_TRACT | Status: AC
  Administered 2020-08-03 (×2): 2 via RESPIRATORY_TRACT
  Filled 2020-08-03: qty 6.7

## 2020-08-03 MED ORDER — DEXAMETHASONE 4 MG PO TABS
6.0000 mg | ORAL_TABLET | ORAL | Status: DC
Start: 2020-08-04 — End: 2020-08-03

## 2020-08-03 MED ORDER — DOXYCYCLINE CALCIUM 50 MG/5ML PO SYRP
100.0000 mg | ORAL_SOLUTION | Freq: Two times a day (BID) | ORAL | Status: DC
  Administered 2020-08-03 – 2020-08-05 (×5): 100 mg
  Filled 2020-08-03 (×6): qty 10

## 2020-08-03 MED ORDER — OSMOLITE 1.5 CAL PO LIQD
237.0000 mL | Freq: Every day | ORAL | Status: DC
  Administered 2020-08-03: 237 mL via ORAL
  Filled 2020-08-03 (×4): qty 237

## 2020-08-03 MED ORDER — DEXAMETHASONE 2 MG PO TABS
6.0000 mg | ORAL_TABLET | Freq: Every day | ORAL | Status: DC
  Administered 2020-08-04 – 2020-08-05 (×2): 6 mg
  Filled 2020-08-03 (×3): qty 3

## 2020-08-03 MED ORDER — ACETAMINOPHEN 325 MG PO TABS
650.0000 mg | ORAL_TABLET | Freq: Once | ORAL | Status: DC
  Filled 2020-08-03: qty 2

## 2020-08-03 NOTE — H&P (Addendum)
[facility] Admission History and Physical Service Pager: [phone]  Patient name: Claudia Wiley Medical record number: [id number] Date of birth: Oct 15, 1955 Age: 65 y.o. Gender: female  Primary Care Provider: [facility] Consultants: none Contact: Sallyanne Kuster [phone] Code Status: Full code  Chief Complaint: Dyspnea  Assessment and Plan: Claudia Wiley is a 65 y.o. female presenting with dyspnea found to have Covid-19 infection and likely COPD exacerbation. 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  Covid-19 infection  COPD exacerbation Patient presenting with 2 weeks of dyspnea and productive cough as well as fever for 2 to 3 days this past week. On presentation, patient was afebrile, mildly tachycardic, otherwise VSS.  She received steroids, breathing treatments, and magnesium with EMS prior to arrival.  She was placed on 3 L of supplemental oxygen, around her baseline 2 L oxygen at home.  COVID positive, otherwise lab work reassuring including WBC of 8.4.  Patient had a chest x-ray with no active disease and hyperexpansion of the lungs. On our exam, she is breathing comfortably on 3 L nasal cannula with faint end expiratory wheezing, though she has received multiple breathing treatments in the ED and prior to arrival. GJ site intact, non-infected appearing. At this point, she has a fairly mild COVID infection and likely COPD exacerbation given her increased sputum production.  Tachycardia likely in the setting of fever and/or dehydration. We will admit patient for treatment of COVID-19 infection and COPD exacerbation. - admit to FPTS, med-surg, Dr. Gwendlyn Deutscher attending - remdesivir (1/20-) - s/p IV methylprednisolone - dexamethasone starting 1/21 x 10 days total steroids or until discharge - DuoNebs q4h prn - Tylenol prn -  LR 500 cc bolus - doxycycline 100 mg q12h x 3 days - restart home COPD medications pending formal med rec - trend CBC, CRP, D-dimer - AM BMP - supplemental O2, goal SpO2 88-92%, wean as tolerated - continuous pulse ox - vitals per unit routine - airborne/contact precautions - IS/flutter valve - PT/OT  Chest pain  myalgias: Patient did complain of mild chest pain which was reproducible on palpation.  Initial troponin of 3, repeat of 3.  Patient also complains of overall body aches which may also be part of the cause of her chest pain.  She has also been coughing significantly so there is likely a MSK component of this. -Continue to monitor -Acetaminophen for discomfort/myalgias -Can consider further pain management if needed  Abdominal pain May be viral-related as she is having diffuse body aches. Minimal tenderness on exam though she has received 1 dose of morphine and 1 dose of hydrocodone in the ED. No acute surgical findings on abdominal exam.  Overall differential can include myalgias/COVID-19, her symptoms of diarrhea which seem to have resolved, or other intra-abdominal processes.  Due to the fact that her abdominal discomfort is mild on physical exam at this time I think it is fine to continue to monitor.  We will check a lipase to look for signs of pancreatitis. - Tylenol prn - lipase - consider CT A/P if worsening  SMA syndrome With malnutrition s/p GJ tube. Per most recent GI note 05/2020, she has SMA syndrome causing partial duodenal obstruction which was seen on CT scan 03/2020.  GJ tube was placed by IR to assist with malnutrition.  At that time, she had been on continuous Osmolite 1.5 feedings and was slowly  gaining weight. - nutrition consult  Esophageal strictures Per most recent GI note 05/2020, she has esophageal strictures from severe esophagitis and has received multiple esophageal dilations with substantial improvement, though still present and has apparently  received maximal benefit from esophageal dilations.  At that time, she was recommended to be on long-term soft diet.  Recommended to avoid bread, chicken, steak, and any other foods that are difficult to swallow.  Recommended pantoprazole 40 mg p.o. twice a day with pudding/yogurt. - soft diet - restart PPI pending formal med rec  GERD It appears she is on pantoprazole and sucralfate. - restart home meds after formal med rec  Allergic rhinitis It appears she is on Flonase. - restart home meds after formal med rec  Prediabetes: A1c today of 5.9 which seems consistent with patient's previous A1c. Not on medications. -Continue to monitor  FEN/GI: soft diet Prophylaxis: LMWH  Disposition: med-surg  History of Present Illness:  Claudia Wiley is a 65 y.o. female presenting with dyspnea.  About two weeks ago feeling more short of breath and having some cough that slowly got worse. Cough was productive with more sputum. Also having headache. Had 2-3 days of fever earlier this week starting 3 days ago, she thinks tmax of 101.4. Also had myalgias.  Patient was brought into the ED via EMS and was given duo nebs, magnesium, and 125 mg of methylprednisolone prior to arrival.  Lives at home with daughter and gradndaughter.  Needs some help with ADLs. Daughter helps with bathing and cooking.  Has a feeding tube since maybe November she says was to help her gain weight.   Fully vaccinated against Covid, has not received booster yet. Received vaccines at the [place].  Tobacco - quit, smoked 15 years 2-3 cigarettes per day Alcohol - no alcohol Drugs- no drugs   Review Of Systems: Per HPI with the following additions:   Review of Systems  Constitutional: Positive for fever.  HENT: Positive for sore throat.   Respiratory: Positive for cough, sputum production and shortness of breath.   Cardiovascular: Positive for chest pain.  Gastrointestinal: Positive for abdominal pain and diarrhea.   Genitourinary: Negative for dysuria.  Musculoskeletal: Positive for myalgias.  Neurological: Positive for headaches.    Patient Active Problem List   Diagnosis Date Noted  . Prediabetes 04/17/2020  . Normocytic anemia 04/14/2020  . Duodenal stenosis   . SMAS (superior mesenteric artery syndrome) ([place]) 04/07/2020  . Gastric outlet obstruction   . Dysphagia   . Protein-calorie malnutrition, severe 10/06/2019  . Orthostasis 10/01/2019  . Candida esophagitis ([place]) 10/01/2019  . Odynophagia   . Leukocytosis   . Esophagitis   . Esophageal dysphagia   . Esophageal stricture   . Respiratory failure ([place]) 09/09/2019  . COPD (chronic obstructive pulmonary disease) ([place]) 06/30/2019  . Acute on chronic respiratory failure with hypoxia and hypercapnia ([place]) 06/29/2019  . GERD (gastroesophageal reflux disease)   . Pancreatic insufficiency   . Nausea & vomiting 04/03/2019  . Abdominal pain 04/03/2019  . Abnormal finding on urinalysis 04/03/2019  . Acute respiratory failure with hypoxia and hypercapnia ([place]) 04/02/2019  . COPD with acute exacerbation ([place]) 06/15/2018  . Essential hypertension 06/15/2018  . Genetic testing 06/01/2018  . Gastrinoma   . Family history of melanoma   . Family history of brain cancer   . Hypokalemia 10/19/2011  . Unspecified protein-calorie malnutrition ([place]) 10/19/2011  . Weight loss 10/19/2011  . Generalized weakness 10/19/2011  . Anodontia 10/19/2011  Past Medical History: Past Medical History:  Diagnosis Date  . Anemia   . Arthritis   . Cancer [facility]) 2013   Upper left lung and stomach cancer  . COPD (chronic obstructive pulmonary disease) ([place])   . Esophageal stricture   . Family history of brain cancer   . Family history of melanoma   . Gastrinoma   . Gastrinoma   . GERD (gastroesophageal reflux disease)   . Hypertension   . Osteoporosis   . Pancreatic insufficiency   . Pneumonia   . PONV (postoperative nausea and vomiting)   . Vitamin  D deficiency   . Weight loss, unintentional     Past Surgical History: Past Surgical History:  Procedure Laterality Date  . ABDOMINAL HYSTERECTOMY    . BIOPSY  09/11/2019   Procedure: BIOPSY;  Surgeon: Lavena Bullion, DO;  Location: [facility];  Service: Gastroenterology;;  . BIOPSY  10/03/2019   Procedure: BIOPSY;  Surgeon: Jerene Bears, MD;  [facility]: [facility];  Service: Gastroenterology;;  . BIOPSY  04/07/2020   Procedure: BIOPSY;  Surgeon: Lavena Bullion, DO;  [facility];  Service: Gastroenterology;;  . BREAST LUMPECTOMY    . CESAREAN SECTION    . ESOPHAGEAL DILATION  03/13/2020   Procedure: ESOPHAGEAL DILATION;  Surgeon: [name], MD;  [facility]: [facility];  Service: Endoscopy;;  . ESOPHAGOGASTRODUODENOSCOPY (EGD) WITH PROPOFOL N/A 09/11/2019   Procedure: ESOPHAGOGASTRODUODENOSCOPY (EGD) WITH PROPOFOL;  Surgeon: Lavena Bullion, DO;  [facility];  Service: Gastroenterology;  Laterality: N/A;  . ESOPHAGOGASTRODUODENOSCOPY (EGD) WITH PROPOFOL N/A 10/03/2019   Procedure: ESOPHAGOGASTRODUODENOSCOPY (EGD) WITH PROPOFOL;  Surgeon: Jerene Bears, MD;  [facility];  Service: Gastroenterology;  Laterality: N/A;  . ESOPHAGOGASTRODUODENOSCOPY (EGD) WITH PROPOFOL N/A 10/19/2019   Procedure: ESOPHAGOGASTRODUODENOSCOPY (EGD) WITH PROPOFOL;  Surgeon: Yetta Flock, MD;  [facility];  Service: Gastroenterology;  Laterality: N/A;  possible dilation  . ESOPHAGOGASTRODUODENOSCOPY (EGD) WITH PROPOFOL N/A 11/02/2019   Procedure: ESOPHAGOGASTRODUODENOSCOPY (EGD) WITH FLUORO AND  WITH PROPOFOL;  Surgeon: [name], MD;  [facility];  Service: Endoscopy;  Laterality: N/A;  need fluoro  . ESOPHAGOGASTRODUODENOSCOPY (EGD) WITH PROPOFOL N/A 11/16/2019   Procedure: ESOPHAGOGASTRODUODENOSCOPY (EGD) WITH PROPOFOL;  Surgeon: [name], MD;  [facility];  Service: Endoscopy;  Laterality: N/A;  .  ESOPHAGOGASTRODUODENOSCOPY (EGD) WITH PROPOFOL N/A 11/29/2019   Procedure: ESOPHAGOGASTRODUODENOSCOPY (EGD) WITH PROPOFOL;  Surgeon: [name], MD;  [facility];  Service: Endoscopy;  Laterality: N/A;  need fluoro  . ESOPHAGOGASTRODUODENOSCOPY (EGD) WITH PROPOFOL N/A 12/21/2019   Procedure: ESOPHAGOGASTRODUODENOSCOPY (EGD) WITH PROPOFOL;  Surgeon: [name], MD;  [facility];  Service: Endoscopy;  Laterality: N/A;  . ESOPHAGOGASTRODUODENOSCOPY (EGD) WITH PROPOFOL N/A 02/07/2020   Procedure: ESOPHAGOGASTRODUODENOSCOPY (EGD) WITH PROPOFOL;  Surgeon: [name], MD;  [facility];  Service: Endoscopy;  Laterality: N/A;  . ESOPHAGOGASTRODUODENOSCOPY (EGD) WITH PROPOFOL N/A 03/13/2020   Procedure: ESOPHAGOGASTRODUODENOSCOPY (EGD) WITH PROPOFOL;  Surgeon: [name], MD;  [facility];  Service: Endoscopy;  Laterality: N/A;  With dilation  . ESOPHAGOGASTRODUODENOSCOPY (EGD) WITH PROPOFOL N/A 04/07/2020   Procedure: ESOPHAGOGASTRODUODENOSCOPY (EGD) WITH PROPOFOL;  Surgeon: Lavena Bullion, DO;  [facility];  Service: Gastroenterology;  Laterality: N/A;  . ESOPHAGOGASTRODUODENOSCOPY (EGD) WITH PROPOFOL N/A 04/25/2020   Procedure: ESOPHAGOGASTRODUODENOSCOPY (EGD) WITH PROPOFOL;  Surgeon: [name], MD;  [facility];  Service: Endoscopy;  Laterality: N/A;  . IR GASTR TUBE CONVERT GASTR-JEJ PER W/FL  MOD SED  04/18/2020  . IR GASTROSTOMY TUBE MOD SED  04/13/2020  . IR RADIOLOGIST EVAL & MGMT  05/03/2020  . LUNG SURGERY    . SAVORY DILATION N/A 10/19/2019   Procedure: SAVORY DILATION;  Surgeon: Yetta Flock, MD;  [facility];  Service: Gastroenterology;  Laterality: N/A;  . SAVORY DILATION N/A 11/02/2019   Procedure: SAVORY DILATION;  Surgeon: [name], MD;  [facility];  Service: Endoscopy;  Laterality: N/A;  fluoro  . SAVORY DILATION N/A 11/16/2019   Procedure: SAVORY DILATION;  Surgeon:  [name], MD;  [facility];  Service: Endoscopy;  Laterality: N/A;  . SAVORY DILATION N/A 11/29/2019   Procedure: SAVORY DILATION;  Surgeon: [name], MD;  [facility];  Service: Endoscopy;  Laterality: N/A;  . SAVORY DILATION N/A 12/21/2019   Procedure: SAVORY DILATION;  Surgeon: [name], MD;  [facility];  Service: Endoscopy;  Laterality: N/A;  . SAVORY DILATION N/A 02/07/2020   Procedure: SAVORY DILATION;  Surgeon: [name], MD;  [facility];  Service: Endoscopy;  Laterality: N/A;  . SAVORY DILATION N/A 04/25/2020   Procedure: SAVORY DILATION;  Surgeon: [name], MD;  [facility];  Service: Endoscopy;  Laterality: N/A;    Social History: Social History   Tobacco Use  . Smoking status: Former Smoker    Packs/day: 0.25    Years: 2.00    Pack years: 0.50    Types: Cigarettes    Quit date: 05/30/2019    Years since quitting: 1.1  . Smokeless tobacco: Never Used  . Tobacco comment: 2 months- quit date  Vaping Use  . Vaping Use: Never used  Substance Use Topics  . Alcohol use: No  . Drug use: No   Additional social history: Lives at home with daughter and gradndaughter.  Needs some help with ADLs. Daughter helps with bathing and cooking. Former smoker (previously smoked 2-3 cigarettes a day for 15 years).  Denies alcohol, recreational drug use. Please also refer to relevant sections of EMR.  Family History: Family History  Problem Relation Age of Onset  . Diabetes Sister        x 3  . Cancer Sister 8       brain that metastized to bone, stomach and other organs  . Melanoma Maternal Aunt   . Heart attack Father   . Pneumonia Brother   . Cancer Other 17       cancer in his chest - nephew  . Colon cancer Neg Hx   . Pancreatic cancer Neg Hx   . Rectal cancer Neg Hx   . Esophageal cancer Neg Hx     Allergies and Medications: Allergies  Allergen Reactions  . Azithromycin Other (See  Comments)    Unknown   . Boniva [Ibandronic Acid] Other (See Comments)    unknown  . Gabapentin Other (See Comments)    unknown   No current facility-administered medications on file prior to encounter.   Current Outpatient Medications on File Prior to Encounter  Medication Sig Dispense Refill  . albuterol (PROVENTIL) (2.5 MG/3ML) 0.083% nebulizer solution Take 2.5 mg by nebulization every 6 (six) hours as needed for wheezing or shortness of breath.    . budesonide-formoterol (SYMBICORT) 160-4.5 MCG/ACT inhaler Inhale 1 puff into the lungs daily. (Patient taking differently: Inhale 1 puff into the lungs in the morning and at bedtime. ) 1 Inhaler 12  . docusate (COLACE) 50 MG/5ML  liquid Place 10 mLs (100 mg total) into feeding tube daily. (Patient taking differently: Place 100 mg into feeding tube every other day. ) 100 mL 0  . feeding supplement, ENSURE ENLIVE, (ENSURE ENLIVE) LIQD Take 237 mLs by mouth 2 (two) times daily between meals. 237 mL 5  . fluticasone (FLONASE) 50 MCG/ACT nasal spray Place 1 spray into both nostrils daily as needed for allergies or rhinitis.    . hydrOXYzine (ATARAX) 10 MG/5ML syrup Place 5 mLs (10 mg total) into feeding tube 3 (three) times daily as needed for itching. 240 mL 0  . Nutritional Supplements (FEEDING SUPPLEMENT, OSMOLITE 1.2 CAL,) LIQD Place 1,000 mLs into feeding tube continuous. (Patient taking differently: Place 1,000 mLs into feeding tube continuous. Now taking 1.5 per feeding tube) 1000 mL 0  . ondansetron (ZOFRAN ODT) 8 MG disintegrating tablet Take 1 tablet (8 mg total) by mouth every 8 (eight) hours as needed for nausea or vomiting. 20 tablet 0  . pantoprazole sodium (PROTONIX) 40 mg/20 mL PACK Place 20 mLs (40 mg total) into feeding tube 2 (two) times daily. 30 mL 0  . sucralfate (CARAFATE) 1 GM/10ML suspension Place 10 mLs (1 g total) into feeding tube 4 (four) times daily -  with meals and at bedtime. 420 mL 0  . Tiotropium Bromide Monohydrate  2.5 MCG/ACT AERS Inhale 2 puffs into the lungs daily.     . traMADol (ULTRAM) 50 MG tablet Take by mouth every 12 (twelve) hours as needed (pain). Information from narcotic database      Objective: BP 96/74   Pulse (!) 101   Temp 98.7 F (37.1 C) (Oral)   Resp 13   Ht 4\' 11"  (1.499 m)   Wt 32.7 kg   SpO2 100%   BMI 14.54 kg/m  Exam: General: Cachectic appearing female, NAD Eyes: PERRL, EOMI ENTM: MM somewhat dry, [place] in place Neck: supple, no LAD Cardiovascular: Mildly tachycardic, regular rhythm, no murmurs, no peripheral edema Respiratory: CTAB, decreased air movement, breathing comfortably on [place], speaking in full sentences Gastrointestinal: soft, minimal diffuse tender, +BS, GJ in place Derm: excessive warmth, dry, no rash, GJ site appears clean without erythema or purulence Neuro: Alert, interactive, full strength in all extremities Psych: Anxious-appearing, tearful, affect appropriate  Labs and Imaging: CBC BMET  Recent Labs  Lab 08/03/20 0738  WBC 8.4  HGB 14.0  HCT 47.2*  PLT 372   Recent Labs  Lab 08/03/20 0954  NA 138  K 4.1  CL 95*  CO2 30  BUN 15  CREATININE 0.48  GLUCOSE 180*  CALCIUM 9.1     DG Chest Port 1 View  Result Date: 08/03/2020 CLINICAL DATA:  Chest pain. EXAM: PORTABLE CHEST 1 VIEW COMPARISON:  April 06, 2020. FINDINGS: The heart size and mediastinal contours are within normal limits. Both lungs are clear. Hyperexpansion of the lungs is noted. No pneumothorax or pleural effusion is noted. The visualized skeletal structures are unremarkable. IMPRESSION: No active disease. Hyperexpansion of the lungs. Electronically Signed   By: Marijo Conception M.D.   On: 08/03/2020 08:02     Zola Button, MD 08/03/2020, 2:22 PM PGY-1, [place] [facility] pager: [phone], text pages welcome   Upper Level Addendum:  I have seen and evaluated this patient along with Dr. Nancy Fetter and reviewed the above note, making necessary revisions in  green.   Lurline Del, DO 08/03/2020, 5:17 PM PGY-2, [place]

## 2020-08-03 NOTE — ED Notes (Signed)
Pt's daughter, Lianne Bushy, updated.

## 2020-08-03 NOTE — ED Provider Notes (Signed)
[facility] EMERGENCY DEPARTMENT Provider Note   CSN: [id number] Arrival date & time: 08/03/20  0725     History Chief Complaint  Patient presents with  . Shortness of Breath    Claudia Wiley is a 65 y.o. female.  HPI   Patient presented to the ED for evaluation of shortness of breath ongoing for the last few days.  Patient states she has been coughing and has had fevers.  She called her doctor and they called in some Augmentin but she did not get it in the mail yet.  Patient states she has had temperatures up to 101.  She has been feeling short of breath and using her breathing treatments.  Patient is chronically on 2 L of nasal cannula oxygen.  Patient states she is aching.  This morning she started to feel more severely short of breath.  She had to call EMS and was given duo nebs 2 g magnesium and 125 mg of Solu-Medrol.  Patient denies any vomiting or diarrhea.  She does have a history of COPD.  Patient has been vaccinated for COVID  Past Medical History:  Diagnosis Date  . Anemia   . Arthritis   . Cancer [facility]) 2013   Upper left lung and stomach cancer  . COPD (chronic obstructive pulmonary disease) ([place])   . Esophageal stricture   . Family history of brain cancer   . Family history of melanoma   . Gastrinoma   . Gastrinoma   . GERD (gastroesophageal reflux disease)   . Hypertension   . Osteoporosis   . Pancreatic insufficiency   . Pneumonia   . PONV (postoperative nausea and vomiting)   . Vitamin D deficiency   . Weight loss, unintentional     Patient Active Problem List   Diagnosis Date Noted  . Prediabetes 04/17/2020  . Normocytic anemia 04/14/2020  . Duodenal stenosis   . 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 Gastric outlet obstruction   . Dysphagia   . Protein-calorie malnutrition, severe 10/06/2019  . Orthostasis 10/01/2019  . Candida esophagitis ([place]) 10/01/2019  . Odynophagia   . Leukocytosis   .  Esophagitis   . Esophageal dysphagia   . Esophageal stricture   . Respiratory failure ([place]) 09/09/2019  . COPD (chronic obstructive pulmonary disease) ([place]) 06/30/2019  . Acute on chronic respiratory failure with hypoxia and hypercapnia ([place]) 06/29/2019  . GERD (gastroesophageal reflux disease)   . Pancreatic insufficiency   . Nausea & vomiting 04/03/2019  . Abdominal pain 04/03/2019  . Abnormal finding on urinalysis 04/03/2019  . Acute respiratory failure with hypoxia and hypercapnia ([place]) 04/02/2019  . COPD with acute exacerbation ([place]) 06/15/2018  . Essential hypertension 06/15/2018  . Genetic testing 06/01/2018  . Gastrinoma   . Family history of melanoma   . Family history of brain cancer   . Hypokalemia 10/19/2011  . Unspecified protein-calorie malnutrition ([place]) 10/19/2011  . Weight loss 10/19/2011  . Generalized weakness 10/19/2011  . Anodontia 10/19/2011    Past Surgical History:  Procedure Laterality Date  . ABDOMINAL HYSTERECTOMY    . BIOPSY  09/11/2019   Procedure: BIOPSY;  Surgeon: Lavena Bullion, DO;  Location: [facility];  Service: Gastroenterology;;  . BIOPSY  10/03/2019   Procedure: BIOPSY;  Surgeon: Jerene Bears, MD;  [facility];  Service: Gastroenterology;;  . BIOPSY  04/07/2020   Procedure: BIOPSY;  Surgeon: Lavena Bullion, DO;  [facility];  Service: Gastroenterology;;  .  BREAST LUMPECTOMY    . CESAREAN SECTION    . ESOPHAGEAL DILATION  03/13/2020   Procedure: ESOPHAGEAL DILATION;  Surgeon: [name], MD;  [facility];  Service: Endoscopy;;  . ESOPHAGOGASTRODUODENOSCOPY (EGD) WITH PROPOFOL N/A 09/11/2019   Procedure: ESOPHAGOGASTRODUODENOSCOPY (EGD) WITH PROPOFOL;  Surgeon: Lavena Bullion, DO;  [facility];  Service: Gastroenterology;  Laterality: N/A;  . ESOPHAGOGASTRODUODENOSCOPY (EGD) WITH PROPOFOL N/A 10/03/2019   Procedure: ESOPHAGOGASTRODUODENOSCOPY (EGD) WITH PROPOFOL;  Surgeon: Jerene Bears, MD;  [facility];  Service: Gastroenterology;  Laterality: N/A;  . ESOPHAGOGASTRODUODENOSCOPY (EGD) WITH PROPOFOL N/A 10/19/2019   Procedure: ESOPHAGOGASTRODUODENOSCOPY (EGD) WITH PROPOFOL;  Surgeon: Yetta Flock, MD;  [facility];  Service: Gastroenterology;  Laterality: N/A;  possible dilation  . ESOPHAGOGASTRODUODENOSCOPY (EGD) WITH PROPOFOL N/A 11/02/2019   Procedure: ESOPHAGOGASTRODUODENOSCOPY (EGD) WITH FLUORO AND  WITH PROPOFOL;  Surgeon: [name], MD;  [facility];  Service: Endoscopy;  Laterality: N/A;  need fluoro  . ESOPHAGOGASTRODUODENOSCOPY (EGD) WITH PROPOFOL N/A 11/16/2019   Procedure: ESOPHAGOGASTRODUODENOSCOPY (EGD) WITH PROPOFOL;  Surgeon: [name], MD;  [facility];  Service: Endoscopy;  Laterality: N/A;  . ESOPHAGOGASTRODUODENOSCOPY (EGD) WITH PROPOFOL N/A 11/29/2019   Procedure: ESOPHAGOGASTRODUODENOSCOPY (EGD) WITH PROPOFOL;  Surgeon: [name], MD;  [facility];  Service: Endoscopy;  Laterality: N/A;  need fluoro  . ESOPHAGOGASTRODUODENOSCOPY (EGD) WITH PROPOFOL N/A 12/21/2019   Procedure: ESOPHAGOGASTRODUODENOSCOPY (EGD) WITH PROPOFOL;  Surgeon: [name], MD;  [facility];  Service: Endoscopy;  Laterality: N/A;  . ESOPHAGOGASTRODUODENOSCOPY (EGD) WITH PROPOFOL N/A 02/07/2020   Procedure: ESOPHAGOGASTRODUODENOSCOPY (EGD) WITH PROPOFOL;  Surgeon: [name], MD;  [facility];  Service: Endoscopy;  Laterality: N/A;  . ESOPHAGOGASTRODUODENOSCOPY (EGD) WITH PROPOFOL N/A 03/13/2020   Procedure: ESOPHAGOGASTRODUODENOSCOPY (EGD) WITH PROPOFOL;  Surgeon: [name], MD;  [facility];  Service: Endoscopy;  Laterality: N/A;  With dilation  . ESOPHAGOGASTRODUODENOSCOPY (EGD) WITH PROPOFOL N/A 04/07/2020   Procedure: ESOPHAGOGASTRODUODENOSCOPY (EGD) WITH PROPOFOL;  Surgeon: Lavena Bullion, DO;  [facility];  Service: Gastroenterology;  Laterality:  N/A;  . ESOPHAGOGASTRODUODENOSCOPY (EGD) WITH PROPOFOL N/A 04/25/2020   Procedure: ESOPHAGOGASTRODUODENOSCOPY (EGD) WITH PROPOFOL;  Surgeon: [name], MD;  [facility];  Service: Endoscopy;  Laterality: N/A;  . IR GASTR TUBE CONVERT GASTR-JEJ PER W/FL MOD SED  04/18/2020  . IR GASTROSTOMY TUBE MOD SED  04/13/2020  . IR RADIOLOGIST EVAL & MGMT  05/03/2020  . LUNG SURGERY    . SAVORY DILATION N/A 10/19/2019   Procedure: SAVORY DILATION;  Surgeon: Yetta Flock, MD;  [facility];  Service: Gastroenterology;  Laterality: N/A;  . SAVORY DILATION N/A 11/02/2019   Procedure: SAVORY DILATION;  Surgeon: [name], MD;  [facility];  Service: Endoscopy;  Laterality: N/A;  fluoro  . SAVORY DILATION N/A 11/16/2019   Procedure: SAVORY DILATION;  Surgeon: [name], MD;  [facility];  Service: Endoscopy;  Laterality: N/A;  . SAVORY DILATION N/A 11/29/2019   Procedure: SAVORY DILATION;  Surgeon: [name], MD;  [facility];  Service: Endoscopy;  Laterality: N/A;  . SAVORY DILATION N/A 12/21/2019   Procedure: SAVORY DILATION;  Surgeon: [name], MD;  [facility];  Service: Endoscopy;  Laterality: N/A;  . SAVORY DILATION N/A 02/07/2020   Procedure: SAVORY DILATION;  Surgeon: [name], MD;  [facility];  Service: Endoscopy;  Laterality: N/A;  . SAVORY DILATION N/A 04/25/2020  Procedure: SAVORY DILATION;  Surgeon: [name], MD;  Location: [facility];  Service: Endoscopy;  Laterality: N/A;     OB History   No obstetric history on file.     Family History  Problem Relation Age of Onset  . Diabetes Sister        x 3  . Cancer Sister 37       brain that metastized to bone, stomach and other organs  . Melanoma Maternal Aunt   . Heart attack Father   . Pneumonia Brother   . Cancer Other 17       cancer in his chest - nephew  . Colon cancer Neg Hx   . Pancreatic cancer Neg Hx   .  Rectal cancer Neg Hx   . Esophageal cancer Neg Hx     Social History   Tobacco Use  . Smoking status: Former Smoker    Packs/day: 0.25    Years: 2.00    Pack years: 0.50    Types: Cigarettes    Quit date: 05/30/2019    Years since quitting: 1.1  . Smokeless tobacco: Never Used  . Tobacco comment: 2 months- quit date  Vaping Use  . Vaping Use: Never used  Substance Use Topics  . Alcohol use: No  . Drug use: No    Home Medications Prior to Admission medications   Medication Sig Start Date End Date Taking? Authorizing Provider  albuterol (PROVENTIL) (2.5 MG/3ML) 0.083% nebulizer solution Take 2.5 mg by nebulization every 6 (six) hours as needed for wheezing or shortness of breath.    [provider]  budesonide-formoterol (SYMBICORT) 160-4.5 MCG/ACT inhaler Inhale 1 puff into the lungs daily. Patient taking differently: Inhale 1 puff into the lungs in the morning and at bedtime.  12/21/19   [name], MD  docusate (COLACE) 50 MG/5ML liquid Place 10 mLs (100 mg total) into feeding tube daily. Patient taking differently: Place 100 mg into feeding tube every other day.  04/20/20   Donne Hazel, MD  feeding supplement, ENSURE ENLIVE, (ENSURE ENLIVE) LIQD Take 237 mLs by mouth 2 (two) times daily between meals. 04/19/20   Donne Hazel, MD  fluticasone (FLONASE) 50 MCG/ACT nasal spray Place 1 spray into both nostrils daily as needed for allergies or rhinitis.    [provider]  hydrOXYzine (ATARAX) 10 MG/5ML syrup Place 5 mLs (10 mg total) into feeding tube 3 (three) times daily as needed for itching. 04/19/20   Donne Hazel, MD  Nutritional Supplements (FEEDING SUPPLEMENT, OSMOLITE 1.2 CAL,) LIQD Place 1,000 mLs into feeding tube continuous. Patient taking differently: Place 1,000 mLs into feeding tube continuous. Now taking 1.5 per feeding tube 04/19/20   Donne Hazel, MD  ondansetron (ZOFRAN ODT) 8 MG disintegrating tablet Take 1 tablet (8 mg total) by  mouth every 8 (eight) hours as needed for nausea or vomiting. 04/21/20   Lacretia Leigh, MD  pantoprazole sodium (PROTONIX) 40 mg/20 mL PACK Place 20 mLs (40 mg total) into feeding tube 2 (two) times daily. 04/19/20   Donne Hazel, MD  sucralfate (CARAFATE) 1 GM/10ML suspension Place 10 mLs (1 g total) into feeding tube 4 (four) times daily -  with meals and at bedtime. 04/19/20   Donne Hazel, MD  Tiotropium Bromide Monohydrate 2.5 MCG/ACT AERS Inhale 2 puffs into the lungs daily.     [provider]  traMADol (ULTRAM) 50 MG tablet Take by mouth every 12 (twelve) hours as needed (  pain). Information from narcotic database    [provider]    Allergies    Azithromycin, Boniva [ibandronic acid], and Gabapentin  Review of Systems   Review of Systems  All other systems reviewed and are negative.   Physical Exam Updated Vital Signs BP 121/82   Pulse (!) 102   Temp 98.7 F (37.1 C) (Oral)   Resp 15   Ht 1.499 m (4\' 11" )   Wt 32.7 kg   SpO2 99%   BMI 14.54 kg/m   Physical Exam Vitals and nursing note reviewed.  Constitutional:      Appearance: She is well-nourished. She is not toxic-appearing or diaphoretic.     Comments: Thin, underweight  HENT:     Head: Normocephalic and atraumatic.     Right Ear: External ear normal.     Left Ear: External ear normal.  Eyes:     General: No scleral icterus.       Right eye: No discharge.        Left eye: No discharge.     Conjunctiva/sclera: Conjunctivae normal.  Neck:     Trachea: No tracheal deviation.  Cardiovascular:     Rate and Rhythm: Normal rate and regular rhythm.     Pulses: Intact distal pulses.  Pulmonary:     Effort: Pulmonary effort is normal. No respiratory distress.     Breath sounds: No stridor. Wheezing present. No rales.  Abdominal:     General: Bowel sounds are normal. There is no distension.     Palpations: Abdomen is soft.     Tenderness: There is no abdominal tenderness. There is no  guarding or rebound.  Musculoskeletal:        General: No tenderness or edema.     Cervical back: Neck supple.  Skin:    General: Skin is warm and dry.     Findings: No rash.  Neurological:     Mental Status: She is alert.     Cranial Nerves: No cranial nerve deficit (no facial droop, extraocular movements intact, no slurred speech).     Sensory: No sensory deficit.     Motor: No abnormal muscle tone or seizure activity.     Coordination: Coordination normal.     Deep Tendon Reflexes: Strength normal.  Psychiatric:        Mood and Affect: Mood and affect normal.     ED Results / Procedures / Treatments   Labs (all labs ordered are listed, but only abnormal results are displayed) Labs Reviewed  SARS CORONAVIRUS 2 BY RT PCR (HOSPITAL ORDER, [place] [facility]) - Abnormal; Notable for the following components:      Result Value   SARS Coronavirus 2 POSITIVE (*)    All other components within normal limits  CBC WITH DIFFERENTIAL/PLATELET - Abnormal; Notable for the following components:   RBC 5.34 (*)    HCT 47.2 (*)    MCHC 29.7 (*)    All other components within normal limits  COMPREHENSIVE METABOLIC PANEL - Abnormal; Notable for the following components:   Chloride 95 (*)    Glucose, Bld 180 (*)    Total Protein 6.4 (*)    Albumin 3.4 (*)    All other components within normal limits  BRAIN NATRIURETIC PEPTIDE  TROPONIN I (HIGH SENSITIVITY)  TROPONIN I (HIGH SENSITIVITY)    EKG EKG Interpretation  Date/Time:  Thursday August 03 2020 07:33:46 EST Ventricular Rate:  102 PR Interval:    QRS Duration:  134 QT Interval:  331 QTC Calculation: 432 R Axis:   89 Text Interpretation: Sinus tachycardia Right atrial enlargement Probable anterolateral infarct, acute No significant change since last tracing Artifact Confirmed by Dorie Rank [phone]) on 08/03/2020 7:47:25 AM   Radiology DG Chest Port 1 View  Result Date: 08/03/2020 CLINICAL DATA:  Chest pain.  EXAM: PORTABLE CHEST 1 VIEW COMPARISON:  April 06, 2020. FINDINGS: The heart size and mediastinal contours are within normal limits. Both lungs are clear. Hyperexpansion of the lungs is noted. No pneumothorax or pleural effusion is noted. The visualized skeletal structures are unremarkable. IMPRESSION: No active disease. Hyperexpansion of the lungs. Electronically Signed   By: Marijo Conception M.D.   On: 08/03/2020 08:02    Procedures Procedures (including critical care time)  Medications Ordered in ED Medications  morphine 4 MG/ML injection 4 mg (has no administration in time range)  albuterol (VENTOLIN HFA) 108 (90 Base) MCG/ACT inhaler 2 puff (2 puffs Inhalation Given 08/03/20 0856)  acetaminophen (TYLENOL) 160 MG/5ML solution 650 mg (650 mg Oral Given 08/03/20 0856)  HYDROcodone-acetaminophen (HYCET) 7.5-325 mg/15 ml solution 10 mL (10 mLs Oral Given 08/03/20 1012)    ED Course  I have reviewed the triage vital signs and the nursing notes.  Pertinent labs & imaging results that were available during my care of the patient were reviewed by me and considered in my medical decision making (see chart for details).    MDM Rules/Calculators/A&P                         Patient presented with shortness of breath in the setting of known COPD.  EMS found the patient to be wheezing on exam.  They provided breathing treatments.  Patient improved in the ED but she still was having some wheezing.  She was given subsequent albuterol treatments.  Patient's cardiac enzymes and BNP are normal.  No signs to suggest CHF or acute coronary syndrome.  Patient's chest x-ray does not show pneumonia or pneumothorax.  Her COVID test unfortunately is positive.  She does have an oxygen requirement although she does have one at baseline.  She still is having some intermittent wheezing.  Considering her COPD exacerbation and new covid diagnosis I do think she would benefit from admission to hospital and further  treatment. Final Clinical Impression(s) / ED Diagnoses Final diagnoses:  COPD exacerbation ([place])  COVID-19 virus infection      Dorie Rank, MD 08/03/20 1407

## 2020-08-03 NOTE — ED Triage Notes (Signed)
Patient BIB [facility] from home. Patient with complaint of increased shortness of breath x3 days. Patient with history of COPD, and lung cancer. Patient presented with expiratory wheezing and decreased sounds in lower lungs. Patient received duoneb, 2 g Mag, 125 mg solumedrol via EMS. VSS.

## 2020-08-03 NOTE — Progress Notes (Signed)
Patient admitted to [facility]W from ED. Patient is alert and oriented x4. Vital signs are stable and he is on 2L of oxygen, which is also the patients baseline. She is complaining of severe generalized pain. Skin is intact, no signs of skin breakdown noted on exam. Patient belongings at bedside (clothing, cell phone and charger). The patient was shown how to use the call bell. Call bell, phone and bedside table are within reach; bed is in the lowest position.

## 2020-08-04 DIAGNOSIS — Z902 Acquired absence of lung [part of]: Secondary | ICD-10-CM | POA: Diagnosis not present

## 2020-08-04 DIAGNOSIS — R7303 Prediabetes: Secondary | ICD-10-CM | POA: Diagnosis present

## 2020-08-04 DIAGNOSIS — K551 Chronic vascular disorders of intestine: Secondary | ICD-10-CM

## 2020-08-04 DIAGNOSIS — Z85118 Personal history of other malignant neoplasm of bronchus and lung: Secondary | ICD-10-CM | POA: Diagnosis not present

## 2020-08-04 DIAGNOSIS — Z87891 Personal history of nicotine dependence: Secondary | ICD-10-CM | POA: Diagnosis not present

## 2020-08-04 DIAGNOSIS — J309 Allergic rhinitis, unspecified: Secondary | ICD-10-CM | POA: Diagnosis present

## 2020-08-04 DIAGNOSIS — Z833 Family history of diabetes mellitus: Secondary | ICD-10-CM | POA: Diagnosis not present

## 2020-08-04 DIAGNOSIS — U071 COVID-19: Principal | ICD-10-CM

## 2020-08-04 DIAGNOSIS — J441 Chronic obstructive pulmonary disease with (acute) exacerbation: Secondary | ICD-10-CM

## 2020-08-04 DIAGNOSIS — Z808 Family history of malignant neoplasm of other organs or systems: Secondary | ICD-10-CM | POA: Diagnosis not present

## 2020-08-04 DIAGNOSIS — Z881 Allergy status to other antibiotic agents status: Secondary | ICD-10-CM | POA: Diagnosis not present

## 2020-08-04 DIAGNOSIS — I10 Essential (primary) hypertension: Secondary | ICD-10-CM | POA: Diagnosis present

## 2020-08-04 DIAGNOSIS — Z9981 Dependence on supplemental oxygen: Secondary | ICD-10-CM | POA: Diagnosis not present

## 2020-08-04 DIAGNOSIS — R64 Cachexia: Secondary | ICD-10-CM | POA: Diagnosis present

## 2020-08-04 DIAGNOSIS — Z7951 Long term (current) use of inhaled steroids: Secondary | ICD-10-CM | POA: Diagnosis not present

## 2020-08-04 DIAGNOSIS — K2100 Gastro-esophageal reflux disease with esophagitis, without bleeding: Secondary | ICD-10-CM | POA: Diagnosis present

## 2020-08-04 DIAGNOSIS — M7918 Myalgia, other site: Secondary | ICD-10-CM | POA: Diagnosis present

## 2020-08-04 DIAGNOSIS — K222 Esophageal obstruction: Secondary | ICD-10-CM | POA: Diagnosis present

## 2020-08-04 DIAGNOSIS — K315 Obstruction of duodenum: Secondary | ICD-10-CM | POA: Diagnosis present

## 2020-08-04 DIAGNOSIS — Z8249 Family history of ischemic heart disease and other diseases of the circulatory system: Secondary | ICD-10-CM | POA: Diagnosis not present

## 2020-08-04 DIAGNOSIS — E43 Unspecified severe protein-calorie malnutrition: Secondary | ICD-10-CM | POA: Diagnosis present

## 2020-08-04 DIAGNOSIS — Z681 Body mass index (BMI) 19 or less, adult: Secondary | ICD-10-CM | POA: Diagnosis not present

## 2020-08-04 DIAGNOSIS — R739 Hyperglycemia, unspecified: Secondary | ICD-10-CM | POA: Diagnosis present

## 2020-08-04 DIAGNOSIS — Z888 Allergy status to other drugs, medicaments and biological substances status: Secondary | ICD-10-CM | POA: Diagnosis not present

## 2020-08-04 LAB — CBC WITH DIFFERENTIAL/PLATELET
Abs Immature Granulocytes: 0.02 10*3/uL (ref 0.00–0.07)
Basophils Absolute: 0 10*3/uL (ref 0.0–0.1)
Basophils Relative: 0 %
Eosinophils Absolute: 0 10*3/uL (ref 0.0–0.5)
Eosinophils Relative: 0 %
HCT: 44 % (ref 36.0–46.0)
Hemoglobin: 13.2 g/dL (ref 12.0–15.0)
Immature Granulocytes: 0 %
Lymphocytes Relative: 18 %
Lymphs Abs: 1.5 10*3/uL (ref 0.7–4.0)
MCH: 26 pg (ref 26.0–34.0)
MCHC: 30 g/dL (ref 30.0–36.0)
MCV: 86.8 fL (ref 80.0–100.0)
Monocytes Absolute: 0.9 10*3/uL (ref 0.1–1.0)
Monocytes Relative: 11 %
Neutro Abs: 6.2 10*3/uL (ref 1.7–7.7)
Neutrophils Relative %: 71 %
Platelets: 360 10*3/uL (ref 150–400)
RBC: 5.07 MIL/uL (ref 3.87–5.11)
RDW: 15.2 % (ref 11.5–15.5)
WBC: 8.7 10*3/uL (ref 4.0–10.5)
nRBC: 0 % (ref 0.0–0.2)

## 2020-08-04 LAB — BASIC METABOLIC PANEL
Anion gap: 10 (ref 5–15)
BUN: 17 mg/dL (ref 8–23)
CO2: 31 mmol/L (ref 22–32)
Calcium: 9.2 mg/dL (ref 8.9–10.3)
Chloride: 101 mmol/L (ref 98–111)
Creatinine, Ser: 0.37 mg/dL — ABNORMAL LOW (ref 0.44–1.00)
GFR, Estimated: >60 mL/min (ref >60)
Glucose, Bld: 84 mg/dL (ref 70–99)
Potassium: 4.2 mmol/L (ref 3.5–5.1)
Sodium: 142 mmol/L (ref 135–145)

## 2020-08-04 LAB — C-REACTIVE PROTEIN: CRP: 0.7 mg/dL (ref <1.0)

## 2020-08-04 LAB — D-DIMER, QUANTITATIVE: D-Dimer, Quant: <0.27 ug/mL-FEU (ref 0.00–0.50)

## 2020-08-04 MED ORDER — ACETAMINOPHEN 160 MG/5ML PO SOLN
1000.0000 mg | Freq: Three times a day (TID) | ORAL | Status: DC
  Administered 2020-08-04 – 2020-08-06 (×7): 1000 mg
  Filled 2020-08-04 (×7): qty 40.6

## 2020-08-04 MED ORDER — OSMOLITE 1.5 CAL PO LIQD
55.0000 mL/h | ORAL | Status: DC
  Filled 2020-08-04: qty 237

## 2020-08-04 MED ORDER — ONDANSETRON 4 MG PO TBDP
4.0000 mg | ORAL_TABLET | Freq: Three times a day (TID) | ORAL | Status: DC | PRN
  Administered 2020-08-04 – 2020-08-06 (×4): 4 mg via ORAL
  Filled 2020-08-04 (×4): qty 1

## 2020-08-04 MED ORDER — OSMOLITE 1.5 CAL PO LIQD
55.0000 mL/h | ORAL | Status: AC
  Filled 2020-08-04: qty 237

## 2020-08-04 MED ORDER — OSMOLITE 1.5 CAL PO LIQD: 55.0000 mL/h | ORAL | Status: DC

## 2020-08-04 MED ORDER — OSMOLITE 1.5 CAL PO LIQD
1000.0000 mL | ORAL | Status: DC
  Administered 2020-08-04 – 2020-08-05 (×3): 1000 mL
  Filled 2020-08-04 (×3): qty 1000

## 2020-08-04 NOTE — TOC Progression Note (Signed)
Transition of Care [facility]) - Progression Note    Patient Details  Name: Claudia Wiley MRN: [id number] Date of Birth: 02/20/1956  Transition of Care [facility] CM/SW [place], LCSW Phone Number: 08/04/2020, 4:26 PM  Clinical Narrative:    CSW left voicemail for [facility] Coord[facility] April to make her aware of patient's hospitalization. CSW confirmed with the [facility] A[facility] that patient's [facility] provider is Harl Bowie [phone]). Social Worker is Chief Financial Officer [phone] x [phone]).   Expected Discharge Plan: [place] Barriers to Discharge: Continued Medical Work up  Expected Discharge Plan and Services Expected Discharge Plan: [place]   Discharge Planning Services: CM Consult   Living arrangements for the past 2 months: Single Family Home                                       Social Determinants of Health (SDOH) Interventions    Readmission Risk Interventions Readmission Risk Prevention Plan 04/14/2020 10/05/2019  Transportation Screening Complete Complete  PCP or Specialist Appt within 3-5 Days Complete Complete  HRI or Home Care Consult Complete Not Complete  Social Work Consult for [place] Planning/Counseling Complete Not Complete  Palliative Care Screening Not Applicable Not Applicable  Medication Review Press photographer) Complete Complete  Some recent data might be hidden

## 2020-08-04 NOTE — Progress Notes (Signed)
Initial Nutrition Assessment  DOCUMENTATION CODES:   Underweight  INTERVENTION:  Continue home regimen of Osmolite 1.5 cal formula via J-tube at rate of 55 ml/hr x 20 hours (1300-0900).  Free water flushes of 20 ml every hour while tube feeds are infusing.   Tube feeding regimen to provide 1650 kcal, 69 grams of protein, and 1236 ml free water.   NUTRITION DIAGNOSIS:   Inadequate oral intake related to inability to eat (SMA syndrome) as evidenced by other (comment) (tube feeding dependent).  GOAL:   Patient will meet greater than or equal to 90% of their needs  MONITOR:   Skin,TF tolerance,Weight trends,Labs,I & O's  REASON FOR ASSESSMENT:   Consult Enteral/tube feeding initiation and management  ASSESSMENT:   65 y.o. female presenting with dyspnea found to have Covid-19 infection and likely COPD exacerbation. 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  Pt has been tolerating her continuous tube feeds via Jtube. Tube feeding orders confirmed with daughter. Recommend continuation of home tube feeding regimen while inpatient.  Unable to complete Nutrition-Focused physical exam at this time. Labs and medications reviewed.   Diet Order:   Diet Order            DIET SOFT Room service appropriate? Yes; Fluid consistency: Thin  Diet effective now                 EDUCATION NEEDS:   Not appropriate for education at this time  Skin:  Skin Assessment: Reviewed RN Assessment  Last BM:  1/21  Height:   Ht Readings from Last 1 Encounters:  08/03/20 4\' 11"  (1.499 m)    Weight:   Wt Readings from Last 1 Encounters:  08/03/20 32.7 kg    BMI:  Body mass index is 14.54 kg/m.  Estimated Nutritional Needs:   Kcal:  9735-3299  Protein:  65-80 grams  Fluid:  >/= 1.5 L/day  Corrin Parker, MS, RD, LDN RD pager number/after hours weekend pager number  on Amion.

## 2020-08-04 NOTE — Progress Notes (Signed)
Family Medicine Teaching Service Daily Progress Note Intern Pager: [phone]  Patient name: Claudia Wiley [facility] Medical record number: [id number] Date of birth: 1955-11-22 Age: 65 y.o. Gender: female  Primary Care Provider: [facility] [place] [facility] Consultants: none   Code Status: Full Code  Pt Overview and Major Events to Date:  1/20 admitted  Assessment and Plan: Claudia Wiley is a 65 y.o. female presenting with dyspnea found to have Covid-19 infection and likely COPD exacerbation. 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  Covid-19 infection  COPD exacerbation Remains stable on 2-3L [place], around her baseline 2L O2. - remdesivir (1/20) - dexamethasone - doxycycline 100 mg q12h x 3 days - albuterol prn - Incruse Ellipta, Breo Ellipta (formulary) - trend inflammatory markers - supplemental O2, goal SpO2 88-92%, wean as tolerated - IS/flutter valve - airborne/contact precautions  Abdominal pain / Myalgias Worse today though she is having pain diffusely throughout abdomen and entire back. Suspect myalgias due to viral infection.  Lipase normal. - scheduled Tylenol - tramadol 50 mg q12h prn - can consider additional pain medications if not well-controlled - can consider CT A/P if worsening  Malnutrition Receives tube feeds via GJ tube due to partial duodenal obstruction from SMA syndrome. Was started on bolus feeds last night per med rec which she was not able to tolerate. Confirmed with daughter that she is on continuous Osmolite 1.5 feedings (55 mL/h with 20 mL flush every hour for 20 hours a day, receives a break from 9 AM after 1 PM. - continuous Osmolite 1.5 cal 55 ml/hr (with 20 ml flush every hour) from 1300-900 - mirtazapine  Esophageal strictures Due to severe esophagitis s/p multiple esophageal dilations. - PPI - soft diet - avoid NSAIDs  GERD -  pantoprazole 40 mg BID - sucralfate 1g TID  Allergic rhinitis - Flonase prn  FEN/GI: soft diet, Osmolite continuous feeds PPx: LMWH  Disposition: med-surg, possible dc tomorrow  Subjective:  NAOE. Patient reporting significant pain in her chest, back and abdomen. She reports diffuse body aches. Had some vomiting last night with bolus feeds (which she is not normally on). Cough with minimal sputum production.  Spoke with daughter while in room with patient. Updated daughter and confirmed her tube feed regimen. She is on continuous feeds and does not tolerate bolus feeds.  Objective: Temp:  [98.3 F (36.8 C)-98.7 F (37.1 C)] 98.3 F (36.8 C) (01/21 0400) Pulse Rate:  [91-110] 100 (01/21 0400) Resp:  [12-22] 16 (01/21 0400) BP: (96-162)/(68-94) 124/83 (01/21 0400) SpO2:  [95 %-100 %] 100 % (01/21 0400) Weight:  [32.7 kg] 32.7 kg (01/20 0736) Physical Exam: General: Cachectic ill-appearing female, anxious, NAD Cardiovascular: Tachycardic, regular rhythm, no murmurs Respiratory: Faint expiratory wheezes diffusely, breathing comfortably on 3L [place] Abdomen: soft, diffuse tenderness, guarding present without rebound MSK: tenderness to palpation throughout entire back including midline and paraspinal muscles Neuro: Alert, interactive, full strength in all extremities Extremities: WWP, no edema  Laboratory: Recent Labs  Lab 08/03/20 0738 08/04/20 0230  WBC 8.4 8.7  HGB 14.0 13.2  HCT 47.2* 44.0  PLT 372 360   Recent Labs  Lab 08/03/20 0954 08/04/20 0230  NA 138 142  K 4.1 4.2  CL 95* 101  CO2 30 31  BUN 15 17  CREATININE 0.48 0.37*  CALCIUM 9.1 9.2  PROT 6.4*  --   BILITOT 0.3  --   [facility]  89  --   ALT 16  --   AST 26  --   GLUCOSE 180* 84     Imaging/Diagnostic Tests: No new imaging.  Zola Button, MD 08/04/2020, 7:10 AM PGY-1, [place] Intern pager: [phone], text pages welcome

## 2020-08-04 NOTE — Evaluation (Signed)
Occupational Therapy Evaluation Patient Details Name: Claudia Wiley MRN: [id number] DOB: 22-Mar-1956 Today's Date: 08/04/2020    History of Present Illness 65 yo female with onset of chest pain and worsening cough from baseline was brought to hosp, tested (+) for Covid.  Had diarrhea, body aches, hyiperglycemia, susp pancreatitis and abd pain.  PMHx:  lung CA with lobectomy, COPD, gastrinoma, PEG from esophageal stricture,   Clinical Impression   Pt admitted with above. She demonstrates the below listed deficits and will benefit from continued OT to maximize safety and independence with BADLs.  Pt presents to OT with generalized weakness, impaired balance, decreased activity tolerance.  She currently requires min - max A for ADLs and fatigues quickly with activity.  Sp02 94% on 3L supplemental 02 with activity.  She lives with her daughter and granddaughter at home and required assist with ADLs.  Will follow acutely.       Follow Up Recommendations  Home health OT;Supervision/Assistance - 24 hour    Equipment Recommendations  None recommended by OT    Recommendations for Other Services       Precautions / Restrictions Precautions Precautions: Fall Precaution Comments: monitor HR and sats. Restrictions Weight Bearing Restrictions: No      Mobility Bed Mobility Overal bed mobility: Modified Independent             General bed mobility comments: with HOB elevated    Transfers Overall transfer level: Needs assistance Equipment used: Rolling walker (2 wheeled);1 person hand held assist Transfers: Sit to/from [facility] Sit to Stand: Min guard Stand pivot transfers: Min assist       General transfer comment: min guard for safety    Balance Overall balance assessment: Needs assistance Sitting-balance support: Feet supported Sitting balance-Leahy Scale: Good     Standing balance support: Bilateral upper extremity supported;During functional  activity Standing balance-Leahy Scale: Poor Standing balance comment: requires UE support                           ADL either performed or assessed with clinical judgement   ADL Overall ADL's : Needs assistance/impaired Eating/Feeding: Set up;Sitting   Grooming: Wash/dry hands;Wash/dry face;Set up;Sitting   Upper Body Bathing: Moderate assistance;Sitting   Lower Body Bathing: Moderate assistance;Sit to/from stand   Upper Body Dressing : Moderate assistance;Sitting   Lower Body Dressing: Total assistance   Toilet Transfer: Min guard;Stand-pivot;BSC;RW   Toileting- Clothing Manipulation and Hygiene: Moderate assistance;Sit to/from stand       Functional mobility during ADLs: Min guard;Rolling walker General ADL Comments: Fatigues quickly with activity     Vision         Perception     Praxis      Pertinent Vitals/Pain Pain Assessment: Faces Pain Score: 2  Pain Location: generalized Pain Descriptors / Indicators: Aching Pain Intervention(s): Repositioned;Monitored during session     Hand Dominance Right   Extremity/Trunk Assessment Upper Extremity Assessment Upper Extremity Assessment: Generalized weakness   Lower Extremity Assessment Lower Extremity Assessment: Generalized weakness   Cervical / Trunk Assessment Cervical / Trunk Assessment: Kyphotic   Communication Communication Communication: No difficulties   Cognition Arousal/Alertness: Awake/alert Behavior During Therapy: WFL for tasks assessed/performed Overall Cognitive Status: Within Functional Limits for tasks assessed  General Comments  Pt fatigues quickly. Pt maintained Sp02 95% on 3L with activity.  HR 104    Exercises Exercises: Other exercises (hips 3+, knee flexion and R quads 3+ and otherwise 4+ LE's)   Shoulder Instructions      Home Living Family/patient expects to be discharged to:: Private residence Living  Arrangements: Children;Other relatives Available Help at Discharge: Family Type of Home: Apartment Home Access: Stairs to enter Entrance Stairs-Number of Steps: 3 Entrance Stairs-Rails: Right Home Layout: One level     Bathroom Shower/Tub: Teacher, early years/pre: Standard     Home Equipment: Environmental consultant - 4 wheels;Bedside commode;Shower seat;Grab bars - tub/shower;Transport chair   Additional Comments: Pt lives with daugher and grand daughter      Prior Functioning/Environment Level of Independence: Needs assistance  Gait / Transfers Assistance Needed: Pt uses rollator for short distance ambulation with assist/supervision from family.  She reports prior to onset of COVID she had progressed to being able to ambulate to BR without help ADL's / Homemaking Assistance Needed: Pt sits on shower chair to shower.  She is able to bathe UB and thighs, daughter assists with the remainder.  Daughter assists with all aspects of bathing.  She transfers to [facility] at night, and walks from her recliner to BR during the day with assist.  Family performs all IADLs   Comments: Pt reports she only ambulates from Bedroom to recliner in living room, and to BR due to frequent fatigue        OT Problem List: Decreased strength;Decreased activity tolerance;Impaired balance (sitting and/or standing);Cardiopulmonary status limiting activity      OT Treatment/Interventions: Self-care/ADL training;Therapeutic exercise;Energy conservation;DME and/or AE instruction;Therapeutic activities;Patient/family education;Balance training    OT Goals(Current goals can be found in the care plan section) Acute Rehab OT Goals Patient Stated Goal: to be able to walk to the BR without Daughter's assist OT Goal Formulation: With patient Time For Goal Achievement: 08/17/20 Potential to Achieve Goals: Good ADL Goals Pt Will Perform Grooming: with min guard assist;standing Pt Will Perform Upper Body Bathing: with  set-up;sitting Pt Will Transfer to Toilet: with supervision;ambulating;regular height toilet;bedside commode;grab bars Pt Will Perform Toileting - Clothing Manipulation and hygiene: with supervision;sit to/from stand Pt/caregiver will Perform Home Exercise Program: Increased strength;Both right and left upper extremity;With written HEP provided;Independently  OT Frequency: Min 2X/week   Barriers to D/C:            Co-evaluation              AM-PAC OT "6 Clicks" Daily Activity     Outcome Measure Help from another person eating meals?: None Help from another person taking care of personal grooming?: A Little Help from another person toileting, which includes using toliet, bedpan, or urinal?: A Lot Help from another person bathing (including washing, rinsing, drying)?: A Lot Help from another person to put on and taking off regular upper body clothing?: A Lot Help from another person to put on and taking off regular lower body clothing?: A Lot 6 Click Score: 15   End of Session Equipment Utilized During Treatment: Oxygen Nurse Communication: Mobility status  Activity Tolerance: Patient limited by fatigue Patient left: in bed;with call bell/phone within reach;with bed alarm set  OT Visit Diagnosis: Unsteadiness on feet (R26.81)                Time: [id number] OT Time Calculation (min): 18 min Charges:  OT General Charges $OT Visit: 1 Visit OT Evaluation $OT  Eval Moderate Complexity: 1 Mod  Nilsa Nutting., OTR/L Acute Rehabilitation Services Pager [phone] Office [phone]   Lucille Passy M 08/04/2020, 5:18 PM

## 2020-08-04 NOTE — Evaluation (Signed)
Physical Therapy Evaluation Patient Details Name: Claudia Wiley MRN: [id number] DOB: 09/03/1955 Today's Date: 08/04/2020   History of Present Illness  65 yo female with onset of chest pain and worsening cough from baseline was brought to hosp, tested (+) for Covid.  Had diarrhea, body aches, hyiperglycemia, susp pancreatitis and abd pain.  PMHx:  lung CA with lobectomy, COPD, gastrinoma, PEG from esophageal stricture,  Clinical Impression  Pt was seen for mobility on RW with assistance to stand and to steady, then to sidestep with pt being limited by LE pain.  Her nurse was contacted for meds, pt was noted also to have significant LE weakness.  Follow up with her to work on standing tolerance and safety of mobility, LE strength and endurance to stand and walk with O2 sats controlled.  Pt is hoping to go home with family to care for her and will support this discharge plan as long as pt continues to require as little help as she does.      Follow Up Recommendations Home health PT;Supervision for mobility/OOB    Equipment Recommendations  None recommended by PT    Recommendations for Other Services       Precautions / Restrictions Precautions Precautions: Fall Precaution Comments: monitor HR and sats Restrictions Weight Bearing Restrictions: No      Mobility  Bed Mobility Overal bed mobility: Modified Independent             General bed mobility comments: extra time to sit up and prompts to scoot forward    Transfers Overall transfer level: Needs assistance Equipment used: Rolling walker (2 wheeled);1 person hand held assist Transfers: Sit to/from Stand Sit to Stand: Min guard         General transfer comment: min guard f or safety  Ambulation/Gait Ambulation/Gait assistance: Min guard Gait Distance (Feet): 4 Feet Assistive device: Rolling walker (2 wheeled);1 person hand held assist Gait Pattern/deviations: Step-to pattern;Wide base of support;Trunk  flexed;Decreased stride length Gait velocity: decreased Gait velocity interpretation: <1.31 ft/sec, indicative of household ambulator General Gait Details: sidesteps with RW due to pain on LE's was very limited  Financial trader [place] (Stroke Patients Only)       Balance Overall balance assessment: Needs assistance Sitting-balance support: Feet supported Sitting balance-Leahy Scale: Good     Standing balance support: Bilateral upper extremity supported;During functional activity Standing balance-Leahy Scale: Fair Standing balance comment: less than fair dynamically                             Pertinent Vitals/Pain Pain Assessment: 0-10 Pain Score: 8  Pain Location: LE's Pain Descriptors / Indicators: Tightness;Tender Pain Intervention(s): Monitored during session;Repositioned;Patient requesting pain meds-RN notified    Home Living Family/patient expects to be discharged to:: Private residence Living Arrangements: Children;Other relatives Available Help at Discharge: Family Type of Home: Apartment Home Access: Stairs to enter Entrance Stairs-Rails: Right Entrance Stairs-Number of Steps: 3 Home Layout: One level Home Equipment: [place] - 4 wheels;Bedside commode;Shower seat;Grab bars - tub/shower;Transport chair      Prior Function Level of Independence: Needs assistance         Comments: daughter and granddaughter work in the evening, report family able to provide care as needed     Hand Dominance   Dominant Hand: Right    Extremity/Trunk Assessment   Upper Extremity Assessment Upper Extremity  Assessment: Overall WFL for tasks assessed    Lower Extremity Assessment Lower Extremity Assessment: Generalized weakness    Cervical / Trunk Assessment Cervical / Trunk Assessment: Kyphotic (mild)  Communication   Communication: No difficulties  Cognition Arousal/Alertness: Awake/alert Behavior During  Therapy: WFL for tasks assessed/performed Overall Cognitive Status: Within Functional Limits for tasks assessed                                        General Comments General comments (skin integrity, edema, etc.): Pt is ranging from 94% to 99% sats with mobility to side of bed and standing, very good tolerance with breathing but has 8/10 LE pain inhibiting walking    Exercises     Assessment/Plan    PT Assessment Patient needs continued PT services  PT Problem List Decreased strength;Decreased range of motion;Decreased activity tolerance;Decreased balance;Decreased mobility;Pain;Cardiopulmonary status limiting activity       PT Treatment Interventions DME instruction;Gait training;Stair training;Functional mobility training;Therapeutic activities;Therapeutic exercise;Neuromuscular re-education;Patient/family education;Balance training    PT Goals (Current goals can be found in the Care Plan section)  Acute Rehab PT Goals Patient Stated Goal: to get home with her family to assist her PT Goal Formulation: With patient Time For Goal Achievement: 08/11/20 Potential to Achieve Goals: Good    Frequency Min 3X/week   Barriers to discharge Inaccessible home environment has stairs to enter house    Co-evaluation               AM-PAC PT "6 Clicks" Mobility  Outcome Measure Help needed turning from your back to your side while in a flat bed without using bedrails?: None Help needed moving from lying on your back to sitting on the side of a flat bed without using bedrails?: None Help needed moving to and from a bed to a chair (including a wheelchair)?: A Little Help needed standing up from a chair using your arms (e.g., wheelchair or bedside chair)?: A Little Help needed to walk in hospital room?: A Little Help needed climbing 3-5 steps with a railing? : A Little 6 Click Score: 20    End of Session Equipment Utilized During Treatment: Gait  belt;Oxygen Activity Tolerance: Patient tolerated treatment well;Patient limited by pain Patient left: in bed;with call bell/phone within reach;with bed alarm set Nurse Communication: Mobility status PT Visit Diagnosis: Unsteadiness on feet (R26.81);Muscle weakness (generalized) (M62.81);Pain Pain - Right/Left:  (both) Pain - part of body: Knee;Leg;Ankle and joints of foot    Time: 1418-1500 PT Time Calculation (min) (ACUTE ONLY): 42 min   Charges:   PT Evaluation $PT Eval Moderate Complexity: 1 Mod PT Treatments $Therapeutic Exercise: 8-22 mins $Therapeutic Activity: 8-22 mins       Ramond Dial 08/04/2020, 3:56 PM  Mee Hives, PT MS Acute Rehab Dept. Number: [place] and [place]

## 2020-08-04 NOTE — TOC Initial Note (Signed)
Transition of Care [facility]) - Initial/Assessment Note    Patient Details  Name: Claudia Wiley MRN: [id number] Date of Birth: 1955/10/06  Transition of Care [facility]) CM/SW Contact:    Verdell Carmine, RN Phone Number: 08/04/2020, 12:03 PM  Clinical Narrative:                 Patient arrived for weakness, COVID positive. Patient has a Gtube at home and oxygen  At 2L. Set up with RN 3 months ago, not in [facility] system. Has [facility] insurance seen in [place]. Attempted to call patient in room phone, no answer,  To discuss DC planinng. CM will follow for needs  Expected Discharge Plan: [place] Barriers to Discharge: Continued Medical Work up   Patient Goals and CMS Choice        Expected Discharge Plan and Services Expected Discharge Plan: [place]   Discharge Planning Services: CM Consult   Living arrangements for the past 2 months: Single Family Home                                      Prior Living Arrangements/Services Living arrangements for the past 2 months: Single Family Home   Patient language and need for interpreter reviewed:: Yes        Need for Family Participation in Patient Care: Yes (Comment) Care giver support system in place?: Yes (comment)   Criminal Activity/Legal Involvement Pertinent to Current Situation/Hospitalization: No - Comment as needed  Activities of Daily Living Home Assistive Devices/Equipment: Feeding equipment,Oxygen,Wheelchair ADL Screening (condition at time of admission) Patient's cognitive ability adequate to safely complete daily activities?: Yes Is the patient deaf or have difficulty hearing?: No Does the patient have difficulty seeing, even when wearing glasses/contacts?: No Does the patient have difficulty concentrating, remembering, or making decisions?: No Patient able to express need for assistance with ADLs?: Yes Does the patient have difficulty dressing or bathing?:  No Independently performs ADLs?: Yes (appropriate for developmental age) Communication: Independent Dressing (OT): Needs assistance Is this a change from baseline?: Pre-admission baseline Grooming: Needs assistance Is this a change from baseline?: Pre-admission baseline Feeding: Independent with device (comment) Bathing: Needs assistance Is this a change from baseline?: Pre-admission baseline Toileting: Needs assistance Is this a change from baseline?: Pre-admission baseline In/Out Bed: Needs assistance Is this a change from baseline?: Pre-admission baseline Walks in Home: Independent with device (comment) Does the patient have difficulty walking or climbing stairs?: Yes Weakness of Legs: Both Weakness of Arms/Hands: None  Permission Sought/Granted                  Emotional Assessment       Orientation: : Oriented to Self Alcohol / Substance Use: Not Applicable    Admission diagnosis:  COPD exacerbation ([place]) [J44.1] COVID-19 virus infection [U07.1] COVID-19 [U07.1] Patient Active Problem List   Diagnosis Date Noted  . COVID-19 08/03/2020  . COVID-19 virus infection 08/03/2020  . Prediabetes 04/17/2020  . Normocytic anemia 04/14/2020  . Duodenal stenosis   . 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 Gastric outlet obstruction   . Dysphagia   . Protein-calorie malnutrition, severe 10/06/2019  . Orthostasis 10/01/2019  . Candida esophagitis ([place]) 10/01/2019  . Odynophagia   . Leukocytosis   . Esophagitis   . Esophageal dysphagia   . Esophageal stricture   . Respiratory failure ([place]) 09/09/2019  .  COPD (chronic obstructive pulmonary disease) ([place]) 06/30/2019  . Acute on chronic respiratory failure with hypoxia and hypercapnia ([place]) 06/29/2019  . GERD (gastroesophageal reflux disease)   . Pancreatic insufficiency   . Nausea & vomiting 04/03/2019  . Abdominal pain 04/03/2019  . Abnormal finding on urinalysis 04/03/2019  . Acute  respiratory failure with hypoxia and hypercapnia ([place]) 04/02/2019  . COPD with acute exacerbation ([place]) 06/15/2018  . Essential hypertension 06/15/2018  . Genetic testing 06/01/2018  . Gastrinoma   . Family history of melanoma   . Family history of brain cancer   . Hypokalemia 10/19/2011  . Unspecified protein-calorie malnutrition ([place]) 10/19/2011  . Weight loss 10/19/2011  . Generalized weakness 10/19/2011  . Anodontia 10/19/2011   PCP:  Clinic, [place]:   [place], [place] [address] [place] [address] Phone: [phone] Fax: [place], [place] [address] [place] [address] Phone: [phone] Fax: [phone]     Social Determinants of Health (SDOH) Interventions    Readmission Risk Interventions Readmission Risk Prevention Plan 04/14/2020 10/05/2019  Transportation Screening Complete Complete  PCP or Specialist Appt within 3-5 Days Complete Complete  HRI or Home Care Consult Complete Not Complete  Social Work Consult for [place] Planning/Counseling Complete Not Complete  Palliative Care Screening Not Applicable Not Applicable  Medication Review Press photographer) Complete Complete  Some recent data might be hidden

## 2020-08-05 ENCOUNTER — Inpatient Hospital Stay (HOSPITAL_COMMUNITY): Payer: No Typology Code available for payment source

## 2020-08-05 LAB — CBC WITH DIFFERENTIAL/PLATELET
Abs Immature Granulocytes: 0.03 10*3/uL (ref 0.00–0.07)
Basophils Absolute: 0 10*3/uL (ref 0.0–0.1)
Basophils Relative: 0 %
Eosinophils Absolute: 0 10*3/uL (ref 0.0–0.5)
Eosinophils Relative: 0 %
HCT: 44.6 % (ref 36.0–46.0)
Hemoglobin: 13 g/dL (ref 12.0–15.0)
Immature Granulocytes: 0 %
Lymphocytes Relative: 24 %
Lymphs Abs: 2.1 10*3/uL (ref 0.7–4.0)
MCH: 26.1 pg (ref 26.0–34.0)
MCHC: 29.1 g/dL — ABNORMAL LOW (ref 30.0–36.0)
MCV: 89.6 fL (ref 80.0–100.0)
Monocytes Absolute: 0.9 10*3/uL (ref 0.1–1.0)
Monocytes Relative: 10 %
Neutro Abs: 5.7 10*3/uL (ref 1.7–7.7)
Neutrophils Relative %: 66 %
Platelets: 341 10*3/uL (ref 150–400)
RBC: 4.98 MIL/uL (ref 3.87–5.11)
RDW: 15.2 % (ref 11.5–15.5)
WBC: 8.6 10*3/uL (ref 4.0–10.5)
nRBC: 0 % (ref 0.0–0.2)

## 2020-08-05 LAB — D-DIMER, QUANTITATIVE: D-Dimer, Quant: <0.27 ug/mL-FEU (ref 0.00–0.50)

## 2020-08-05 LAB — C-REACTIVE PROTEIN: CRP: 0.8 mg/dL (ref <1.0)

## 2020-08-05 MED ORDER — TRAMADOL HCL 50 MG PO TABS
50.0000 mg | ORAL_TABLET | Freq: Three times a day (TID) | ORAL | Status: DC | PRN
  Administered 2020-08-05 (×2): 50 mg via ORAL
  Filled 2020-08-05 (×3): qty 1

## 2020-08-05 MED ORDER — LORAZEPAM 1 MG PO TABS: 1.0000 mg | ORAL_TABLET | Freq: Once | ORAL | Status: DC

## 2020-08-05 MED ORDER — IOHEXOL 9 MG/ML PO SOLN
ORAL | Status: AC
  Filled 2020-08-05: qty 1000

## 2020-08-05 MED ORDER — LORAZEPAM 2 MG/ML IJ SOLN
1.0000 mg | Freq: Once | INTRAMUSCULAR | Status: AC
  Administered 2020-08-05: 1 mg via INTRAVENOUS
  Filled 2020-08-05: qty 1

## 2020-08-05 MED ORDER — GUAIFENESIN-DM 100-10 MG/5ML PO SYRP
10.0000 mL | ORAL_SOLUTION | ORAL | Status: DC | PRN
  Administered 2020-08-05: 10 mL via ORAL
  Filled 2020-08-05: qty 10

## 2020-08-05 MED ORDER — NAPROXEN 250 MG PO TABS
250.0000 mg | ORAL_TABLET | Freq: Two times a day (BID) | ORAL | Status: DC | PRN
  Administered 2020-08-05 – 2020-08-06 (×2): 250 mg via ORAL
  Filled 2020-08-05 (×3): qty 1

## 2020-08-05 MED ORDER — IOHEXOL 9 MG/ML PO SOLN
ORAL | Status: AC
  Administered 2020-08-05: 500 mL
  Filled 2020-08-05: qty 1000

## 2020-08-05 MED ORDER — IOHEXOL 300 MG/ML  SOLN
100.0000 mL | Freq: Once | INTRAMUSCULAR | Status: AC | PRN
  Administered 2020-08-05: 100 mL via INTRAVENOUS

## 2020-08-05 NOTE — Progress Notes (Signed)
Family Medicine Teaching Service Daily Progress Note Intern Pager: [phone]  Patient name: Claudia Wiley Medical record number: [id number] Date of birth: [birth year] Age: 65 y.o. Gender: female  Primary Care Provider: [facility] Consultants: none   Code Status: Full Code  Pt Overview and Major Events to Date:  1/20 admitted  Assessment and Plan: Claudia Wiley is a 65 y.o. female presenting with dyspnea found to have Covid-19 infection and likely COPD exacerbation. 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  Covid-19 infection  COPD exacerbation Remains stable on 2-3L [place], around her baseline 2L O2. - remdesivir (1/20-) - dexamethasone (s/p IV methylprednisolone 1/20, dexamethasone started 1/21) - doxycycline 100 mg q12h x 3 days, will complete tomorrow morning - albuterol prn - Incruse Ellipta, Breo Ellipta (formulary) - trend inflammatory markers - supplemental O2, goal SpO2 88-92%, wean as tolerated - IS/flutter valve - airborne/contact precautions  Abdominal pain / Myalgias Suspect myalgias due to viral infection.  She is still having abdominal pain, though she is also having myalgias throughout her back (primary lower back), neck, thighs.  Myalgias on her chest and lower legs more mild.  Given her history of SMA syndrome, we will go ahead and proceed with abdominal imaging to rule out bowel ischemia. 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 Home feeds: Osmolite 1.5 feedings (55 mL/h with 20 mL flush every hour for 20 hours a day, receives a break from 9 AM after 1  PM. - continuous home feeds - nutrition consult - mirtazapine  Esophageal strictures Due to severe esophagitis s/p multiple esophageal dilations. - PPI - soft diet  GERD - pantoprazole 40 mg BID - sucralfate 1g TID  Allergic rhinitis - Flonase prn  FEN/GI: soft diet, Osmolite continuous feeds PPx: LMWH  Disposition: med-surg, possible dc tomorrow pending CT results  Subjective:  NAOE.  This morning, patient was tearful and is worried about her infection.  She is still having diffuse body aches, primarily in her chest and back.  She also feels her headache has come back, primarily behind her head.  Pain is not significantly changed from yesterday.  She feels like her diarrhea has gotten worse, her stools are now more watery.  She did have some diarrhea prior to coming to the hospital.  Objective: Temp:  [98.5 F (36.9 C)-98.8 F (37.1 C)] 98.5 F (36.9 C) (01/22 0432) Pulse Rate:  [74-91] 74 (01/22 0432) Resp:  [12-20] 12 (01/22 0432) BP: (100-112)/(68-75) 105/73 (01/22 0432) SpO2:  [99 %-100 %] 100 % (01/22 0432) Physical Exam: General: Cachectic ill-appearing female, anxious, NAD Cardiovascular: RRR, no murmurs Respiratory: Audible upper airway wheeze, faint expiratory wheezes diffusely, diminished breath sounds, breathing comfortably on 3L  Abdomen: soft, diffuse tenderness MSK: tenderness to palpation throughout entire back worse in lower back, tenderness to palpation through bilateral thighs and neck, mild tenderness to palpation across chest wall and bilateral arms Neuro: Alert, interactive, full strength in all extremities Extremities: WWP, no edema   Laboratory: Recent Labs  Lab 08/03/20 0738 08/04/20 0230 08/05/20 0500  WBC 8.4 8.7 8.6  HGB 14.0 13.2 13.0  HCT 47.2* 44.0 44.6  PLT 372 360 341   Recent Labs  Lab 08/03/20 0954 08/04/20 0230  NA 138 142  K 4.1 4.2  CL 95* 101  CO2 30 31  BUN 15 17  CREATININE 0.48 0.37*  CALCIUM 9.1 9.2  PROT  6.4*  --   BILITOT 0.3  --   ALKPHOS 89  --   ALT 16  --   AST 26  --   GLUCOSE 180* 84     Imaging/Diagnostic Tests: No new imaging.  Claudia Button, MD 08/05/2020, 7:57 AM PGY-1, [place] Intern pager: [phone], text pages welcome

## 2020-08-05 NOTE — Progress Notes (Signed)
Daughter Helene Kelp updated.  We will plan to give her another update once the CT scan results.

## 2020-08-05 NOTE — Hospital Course (Addendum)
Claudia Wiley is a 65 y.o. 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 Hospital course outlined by problem below:  Covid-19 infection  COPD exacerbation Patient presented afebrile, mildly tachycardic, otherwise VSS.  Work-up was largely unremarkable.  COVID PCR test was positive 1/20.  CXR with evidence of hyperexpansion, otherwise unremarkable.  Given dyspnea and productive cough with increased sputum production, she was treated for presumed COPD exacerbation in addition to COVID-19 infection. Initially received 1 dose of IV methylprednisolone via EMS prior to arrival.  During admission, she was treated with remdesivir x 4 days and dexamethasone x 3 days.  She was also treated with doxycycline for 3 days.  Throughout admission, she remained on 2-3L O2, around her baseline 2L O2.  Abdominal pain Patient with severe abdominal pain and diffuse myalgias. CT abdomen did not show any acute findings.  Pain was treated with Tylenol, tramadol, naproxen, K pad.  Malnutrition Receives continuous tube feeds via [place] tube due to partial duodenal obstruction from SMA syndrome.  Normally receives Osmolite 1.5 continuous feeds at 55 mL/h with 20 mL flush every hour for 20 hours a day from 1300-900.  She was continued on her home feeds during admission.  Esophageal strictures Maintained on soft diet per recommendations on most recent outpatient GI note.

## 2020-08-05 NOTE — Progress Notes (Signed)
Interim progress note  Patient having diffuse upper body aches as well as some abdominal pain.  Is asking for some additional pain medicines.  At home patient is prescribed tramadol and Toradol.  She is already received Tylenol and tramadol.  Echo and renal function reviewed, patient does not have history of renal disease or congestive heart failure.  Placed order for naproxen 250 mg twice daily as needed, gave a K pad, and changed order for tramadol from 50 mg twice daily to 50 mg 3 times daily.  Milus Banister, [place], PGY-3 08/05/2020 6:19 AM

## 2020-08-06 LAB — CBC WITH DIFFERENTIAL/PLATELET
Abs Immature Granulocytes: 0.02 10*3/uL (ref 0.00–0.07)
Basophils Absolute: 0 10*3/uL (ref 0.0–0.1)
Basophils Relative: 0 %
Eosinophils Absolute: 0 10*3/uL (ref 0.0–0.5)
Eosinophils Relative: 0 %
HCT: 42.9 % (ref 36.0–46.0)
Hemoglobin: 12.9 g/dL (ref 12.0–15.0)
Immature Granulocytes: 0 %
Lymphocytes Relative: 22 %
Lymphs Abs: 1.5 10*3/uL (ref 0.7–4.0)
MCH: 26.5 pg (ref 26.0–34.0)
MCHC: 30.1 g/dL (ref 30.0–36.0)
MCV: 88.3 fL (ref 80.0–100.0)
Monocytes Absolute: 0.5 10*3/uL (ref 0.1–1.0)
Monocytes Relative: 7 %
Neutro Abs: 4.7 10*3/uL (ref 1.7–7.7)
Neutrophils Relative %: 71 %
Platelets: 328 10*3/uL (ref 150–400)
RBC: 4.86 MIL/uL (ref 3.87–5.11)
RDW: 15.3 % (ref 11.5–15.5)
WBC: 6.7 10*3/uL (ref 4.0–10.5)
nRBC: 0 % (ref 0.0–0.2)

## 2020-08-06 LAB — COMPREHENSIVE METABOLIC PANEL
ALT: 16 U/L (ref 0–44)
AST: 24 U/L (ref 15–41)
Albumin: 3.3 g/dL — ABNORMAL LOW (ref 3.5–5.0)
Alkaline Phosphatase: 80 U/L (ref 38–126)
Anion gap: 8 (ref 5–15)
BUN: 14 mg/dL (ref 8–23)
CO2: 36 mmol/L — ABNORMAL HIGH (ref 22–32)
Calcium: 9.5 mg/dL (ref 8.9–10.3)
Chloride: 98 mmol/L (ref 98–111)
Creatinine, Ser: 0.54 mg/dL (ref 0.44–1.00)
GFR, Estimated: >60 mL/min (ref >60)
Glucose, Bld: 154 mg/dL — ABNORMAL HIGH (ref 70–99)
Potassium: 5.1 mmol/L (ref 3.5–5.1)
Sodium: 142 mmol/L (ref 135–145)
Total Bilirubin: 0.4 mg/dL (ref 0.3–1.2)
Total Protein: 6.1 g/dL — ABNORMAL LOW (ref 6.5–8.1)

## 2020-08-06 LAB — C-REACTIVE PROTEIN: CRP: 0.8 mg/dL (ref <1.0)

## 2020-08-06 LAB — D-DIMER, QUANTITATIVE: D-Dimer, Quant: <0.27 ug/mL-FEU (ref 0.00–0.50)

## 2020-08-06 MED ORDER — ACETAMINOPHEN 160 MG/5ML PO SOLN: 1000.0000 mg | Freq: Three times a day (TID) | ORAL | 0 refills | Status: AC

## 2020-08-06 MED ORDER — NAPROXEN 250 MG PO TABS: 250.0000 mg | ORAL_TABLET | Freq: Two times a day (BID) | ORAL | 0 refills | Status: DC | PRN

## 2020-08-06 MED ORDER — HYDROXYZINE HCL 10 MG/5ML PO SYRP: 10.0000 mg | ORAL_SOLUTION | Freq: Three times a day (TID) | ORAL | 0 refills | Status: AC | PRN

## 2020-08-06 MED ORDER — TRAMADOL HCL 50 MG PO TABS: 50.0000 mg | ORAL_TABLET | Freq: Three times a day (TID) | ORAL | Status: DC | PRN

## 2020-08-06 NOTE — TOC Transition Note (Addendum)
Transition of Care [facility]) - CM/SW Discharge Note   Patient Details  Name: Claudia Wiley MRN: [id number] Date of Birth: [birth year]  Transition of Care [facility]) CM/SW Contact:  Carles Collet, RN Phone Number: 08/06/2020, 3:46 PM   Clinical Narrative:    [name] w patient who deferred to daughter. Daughter agreeable to [facility] services, would prefer [facility] agency whom they had previously. Records indicate that she was active w [facility]. Spoke w liaison, referral accepted. [facility] will obtain new auth through [place] if old one is expired. Daughter to provide transportation home.  No other CM needs.   Received call from patient's daughter after she was DC'd. She states no one went over DC instructions with her, and she did not know if her mother had an appointment to get an infusion at [facility] tomorrow. CM found mention of [facility] infusion but not instructions regarding it.  Message sent to attending, residents Fredrik Cove, and [place] with request to call daughter Nila Nephew and her phone number.     Final next level of care: [place] Barriers to Discharge: No Barriers Identified   Patient Goals and CMS Choice        Discharge Placement                       Discharge Plan and Services   Discharge Planning Services: CM Consult                      HH Arranged: PT,OT [place] Agency: [facility] Date [place]: 08/06/20 Time [place]: [place] Representative spoke with at [place]: [place]  Social Determinants of Health ([place]) Interventions     Readmission Risk Interventions Readmission Risk Prevention Plan 04/14/2020 10/05/2019  Transportation Screening Complete Complete  PCP or Specialist Appt within 3-5 Days Complete Complete  HRI or [place] Complete Not Complete  Social Work Consult for [place] Planning/Counseling Complete Not Complete  Palliative Care Screening Not Applicable Not Applicable  Medication Review  Press photographer) Complete Complete  Some recent data might be hidden

## 2020-08-06 NOTE — Progress Notes (Signed)
I called and spoke with the patient's daughter Sallyanne Kuster and gave her the [facility] COVID-19 infusion center's phone number [phone], advising her to contact them tomorrow morning after 8 AM to schedule an appointment. She verbalized understanding and agreed with the plan.

## 2020-08-06 NOTE — Discharge Instructions (Signed)
Dear Erma Pinto,   Thank you for letting us participate in your care! In this section, you will find a brief summary of why you were admitted to the hospital, what happened during your admission, your diagnosis/diagnoses, and recommended follow up.   You were admitted because you were experiencing shortness of breath.   You were diagnosed with COVID-19 and a COPD exacerbation.  You were treated with Remdesevir (an antiviral infusion), Decadron (a steroid) and Doxycycline (an antibiotic for the COPD exacerbation).   Your breathing improved and you were discharged from the hospital for meeting this goal.    POST-HOSPITAL & CARE INSTRUCTIONS 1. Someone should be contacting you about receiving 1 additional infusion after you leave the hospital 2. Please let PCP/Specialists know of any changes in medications that were made.   DOCTOR'S APPOINTMENTS & FOLLOW UP Follow up with your pulmonologist on Feb 9th as scheduled  Thank you for choosing [facility]! Take care and be well!  [facility]  [place], [place] [address] [phone]

## 2020-08-06 NOTE — Plan of Care (Signed)
Patient is packed and awaiting the arrival of her daughter for discharge.

## 2020-08-06 NOTE — Progress Notes (Signed)
Sent chat message to Dr. Gwendlyn Deutscher.  Good Morning, may we change the order of Zofran to IV? When the patient received the medications via tube, she is immediately nauseous and has to do everything in her power not to vomit prior to the tube zofran to become effective.

## 2020-08-06 NOTE — Progress Notes (Signed)
Occupational Therapy Treatment Patient Details Name: Claudia Wiley MRN: [id number] DOB: Aug 02, 1955 Today's Date: 08/06/2020    History of present illness 65 yo female with onset of chest pain and worsening cough from baseline was brought to hosp, tested (+) for Covid.  Had diarrhea, body aches, hyiperglycemia, susp pancreatitis and abd pain.  PMHx:  lung CA with lobectomy, COPD, gastrinoma, PEG from esophageal stricture,   OT comments  Pt. Seen for skilled OT session.  Found seated on bsc and states she completed this transfer on her own.  Assisted with arranging tubes/lines.  Pt. Able to complete peri care in standing min guard a.  Bed mobility min guard A.  Reviewed with pt. Safety risks for attempting transfers without assistance.  Call bell left within reach and bed alarm set. Reviewed findings with rn also.    O2 INFORMATION  Pt. On nasal canula 3L 94% while on bsc 91% after standing for pericare and transfer 98% after activity supine in bed   Follow Up Recommendations  Home health OT;Supervision/Assistance - 24 hour    Equipment Recommendations  None recommended by OT    Recommendations for Other Services      Precautions / Restrictions Precautions Precaution Comments: monitor HR and sats.       Mobility Bed Mobility               General bed mobility comments: seated on bsc upon arrival Min guard A back to bed no physical assistance required.   Transfers Overall transfer level: Needs assistance   Transfers: Stand Pivot Transfers;Sit to/from Stand Sit to Stand: Min guard Stand pivot transfers: Min guard       General transfer comment: min guard for safety and to manage multiple lines.  education provided on NOT getting up on her own anymore.  reviewed safety risks. she verbalized understanding but states she could not risk "going boo boo in the bed" states "im an adult i can not do that in the bed".  reviewed i was setting the bed alarm and showed her  how to press call button for assistance moving forward.  also reviewed with RN what had occured    Balance                                           ADL either performed or assessed with clinical judgement   ADL Overall ADL's : Needs assistance/impaired                         Toilet Transfer: Min guard;Stand-pivot;BSC   Toileting- Clothing Manipulation and Hygiene: Min guard;Sit to/from stand         General ADL Comments: able to maintain o2 with activity but notably fatigued. encouraged rest break in between transfer to bed before lying down     Vision       Perception     Praxis      Cognition Arousal/Alertness: Awake/alert Behavior During Therapy: [facility] for tasks assessed/performed Overall Cognitive Status: Within Functional Limits for tasks assessed                                          Exercises     Shoulder Instructions       General Comments  Pertinent Vitals/ Pain       Pain Assessment: No/denies pain  Home Living                                          Prior Functioning/Environment              Frequency  Min 2X/week        Progress Toward Goals  OT Goals(current goals can now be found in the care plan section)  Progress towards OT goals: Progressing toward goals     Plan Discharge plan remains appropriate    Co-evaluation                 AM-PAC OT "6 Clicks" Daily Activity     Outcome Measure   Help from another person eating meals?: None Help from another person taking care of personal grooming?: A Little Help from another person toileting, which includes using toliet, bedpan, or urinal?: A Lot Help from another person bathing (including washing, rinsing, drying)?: A Lot Help from another person to put on and taking off regular upper body clothing?: A Lot Help from another person to put on and taking off regular lower body clothing?: A Lot 6 Click  Score: 15    End of Session Equipment Utilized During Treatment: Oxygen  OT Visit Diagnosis: Unsteadiness on feet (R26.81)   Activity Tolerance Patient tolerated treatment well   Patient Left in bed;with call bell/phone within reach;with bed alarm set   Nurse Communication Other (comment) (reviewed with rn pt. had gotten out of bed and was found on bsc upon arrival. also reviewed pt. request for hot chocolate, rn states ok)        Time: 1040-1055 OT Time Calculation (min): 15 min  Charges: OT General Charges $OT Visit: 1 Visit OT Treatments $Self Care/Home Management : 8-22 mins  Sonia Baller, COTA/L Acute Rehabilitation [phone]   Janice Coffin 08/06/2020, 11:58 AM

## 2020-08-06 NOTE — Progress Notes (Signed)
Sent text via amion to Family Medicine Resident:  May the pt. have IV Zofran.  She now takes w/tube meds and takes very long to work.  she would also like cough medicine.  Thanks!

## 2020-08-06 NOTE — Progress Notes (Addendum)
Family Medicine Teaching Service Daily Progress Note Intern Pager: [phone]  Patient name: Claudia Wiley [facility] Medical record number: [id number] Date of birth: 1956-03-27 Age: 65 y.o. Gender: female  Primary Care Provider: [facility], [facility] Consultants: none   Code Status: Full Code  Pt Overview and Major Events to Date:  1/20 admitted  Assessment and Plan: Claudia Wiley is a 65 y.o. female who presented with dyspnea and was admitted with COVID-19 and COPD exacerbation. 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  COVID-19  COPD exacerbation Remains stable on 2-3L O2 (very close to her baseline 2L).  Reports ongoing cough and feels like she is not able to bring up the sputum. - remdesivir (1/20-) - dexamethasone (s/p IV methylprednisolone 1/20, dexamethasone started 1/21) - doxycycline 100 mg q12h x 3 days (today is day 3/3) - robitussin q4h prn - albuterol prn - Incruse Ellipta, Breo Ellipta (formulary) - supplemental O2, goal SpO2 88-92%, wean as tolerated - IS/flutter valve - airborne/contact precautions  Abdominal pain  Myalgias Suspect myalgias due to viral infection. CT abdomen/pelvis with no acute findings. - scheduled Tylenol 1000 mg TID - tramadol 50 mg TID prn - home pregabalin 150 mg qhs - added naproxen 250 mg BID prn - zofran 4 mg q8h prn for nausea  Malnutrition Receives tube feeds via GJ tube due to partial duodenal obstruction from SMA syndrome. Home feeds: Osmolite 1.5 feedings (55 mL/h with 20 mL flush every hour for 20 hours a day, receives a break from 9 AM after 1 PM. - continue home feeds - mirtazapine 15mg  qhs  Esophageal strictures Due to severe esophagitis s/p multiple esophageal dilations. - PPI - soft diet  GERD - pantoprazole 40 mg BID - sucralfate 1g TID  Allergic rhinitis - Flonase prn  FEN/GI: soft diet,  Osmolite continuous feeds PPx: LMWH   Subjective:  NAOE.  Patient still with abdominal pain and some nausea this morning.  Also reports ongoing cough and feels she is unable to bring up sputum.  Patient does not feel comfortable going home today.  Objective: Temp:  [97.9 F (36.6 C)-98.6 F (37 C)] 97.9 F (36.6 C) (01/23 0631) Pulse Rate:  [84-98] 84 (01/23 0631) Resp:  [17-20] 19 (01/23 0631) BP: (123-154)/(74-80) 130/74 (01/23 0631) SpO2:  [91 %-98 %] 98 % (01/23 0631) Physical Exam: General: Cachectic ill-appearing female, resting comfortably in bed, NAD Cardiovascular: RRR, no murmurs Respiratory: normal WOB on 3L. Diminished breath sounds throughout Abdomen: soft, nondistended, tenderness to palpation of epigastric region and LUQ Extremities: No peripheral edema   Laboratory: Recent Labs  Lab 08/04/20 0230 08/05/20 0500 08/06/20 0240  WBC 8.7 8.6 6.7  HGB 13.2 13.0 12.9  HCT 44.0 44.6 42.9  PLT 360 341 328   Recent Labs  Lab 08/03/20 0954 08/04/20 0230 08/06/20 0240  NA 138 142 142  K 4.1 4.2 5.1  CL 95* 101 98  CO2 30 31 36*  BUN 15 17 14   CREATININE 0.48 0.37* 0.54  CALCIUM 9.1 9.2 9.5  PROT 6.4*  --  6.1*  BILITOT 0.3  --  0.4  ALKPHOS 89  --  80  ALT 16  --  16  AST 26  --  24  GLUCOSE 180* 84 154*     Imaging/Diagnostic Tests: No new imaging/diagnostic tests in past 24 hours.   Alcus Dad, MD 08/06/2020, 9:20 AM PGY-1, [place]  Medicine FPTS Intern pager: [phone], text pages welcome

## 2020-08-06 NOTE — Discharge Summary (Signed)
[facility] Discharge Summary  Patient name: Claudia Wiley [facility] Medical record number: [id number] Date of birth: Sep 23, 1955 Age: 65 y.o. Gender: female Date of Admission: 08/03/2020  Date of Discharge: 08/06/2020 Admitting Physician: Zola Button, MD  Primary Care Provider: [facility] Consultants: PT/OT, nutrition  Indication for Hospitalization: COVID-19, COPD exacerbation  Discharge Diagnoses/Problem List:  COVID-19 COPD Exacerbation Esophageal strictures SMA syndrome Malnutrition GERD  Disposition: Home  Discharge Condition: Stable, improved  Discharge Exam:  General: Cachectic, ill-appearing female, resting comfortably in bed, NAD Cardiovascular: RRR, no murmurs Respiratory: normal WOB on 3L. Diminished breath sounds throughout Abdomen: soft, nondistended, tenderness to palpation of epigastric region and LUQ Extremities: No peripheral edema  Brief Hospital Course:  Claudia Wiley is a 65 y.o. 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 Hospital course outlined by problem below:  Covid-19 infection  COPD exacerbation Patient presented afebrile, mildly tachycardic, otherwise VSS.  Work-up was largely unremarkable.  COVID PCR test was positive 1/20.  CXR with evidence of hyperexpansion, otherwise unremarkable.  Given dyspnea and productive cough with increased sputum production, she was treated for presumed COPD exacerbation in addition to COVID-19 infection. Initially received 1 dose of IV methylprednisolone via EMS prior to arrival.  During admission, she was treated with remdesivir x 4 days and dexamethasone x 3 days.  She was also treated with doxycycline for 3 days.  Throughout admission, she remained on 2-3L O2, around her baseline 2L  O2.  Abdominal pain Patient with severe abdominal pain and diffuse myalgias. CT abdomen did not show any acute findings.  Pain was treated with Tylenol, tramadol, naproxen, K pad.  Malnutrition Receives continuous tube feeds via [place] tube due to partial duodenal obstruction from SMA syndrome.  Normally receives Osmolite 1.5 continuous feeds at 55 mL/h with 20 mL flush every hour for 20 hours a day from 1300-900.  She was continued on her home feeds during admission.  Esophageal strictures Maintained on soft diet per recommendations on most recent outpatient GI note.   Issues for Follow Up:  1. Pulmonology appointment 2/9  Significant Procedures: None  Significant Labs and Imaging:  Recent Labs  Lab 08/04/20 0230 08/05/20 0500 08/06/20 0240  WBC 8.7 8.6 6.7  HGB 13.2 13.0 12.9  HCT 44.0 44.6 42.9  PLT 360 341 328   Recent Labs  Lab 08/03/20 0954 08/04/20 0230 08/06/20 0240  NA 138 142 142  K 4.1 4.2 5.1  CL 95* 101 98  CO2 30 31 36*  GLUCOSE 180* 84 154*  BUN 15 17 14   CREATININE 0.48 0.37* 0.54  CALCIUM 9.1 9.2 9.5  ALKPHOS 89  --  80  AST 26  --  24  ALT 16  --  16  ALBUMIN 3.4*  --  3.3*    CT ABDOMEN PELVIS W CONTRAST Result Date: 1/22/2022IMPRESSION: 1. No evidence of bowel obstruction or inflammation. No evidence of bowel ischemia. 2. Wall thickening suggested in the lower esophagus which may indicate reflux disease or esophagitis. 3. Prominent aortic calcification. Probable calcific stenosis of the iliac arteries. 4. Gastrostomy tube is present. 5. Aortic atherosclerosis.    DG Chest Port 1 View Result Date: 08/03/2020 IMPRESSION: No active disease. Hyperexpansion of the lungs.    Results/Tests Pending at Time of Discharge: None  Discharge Medications:  Allergies as  of 08/06/2020      Reactions   Azithromycin Other (See Comments)   Unknown   Boniva [ibandronic Acid] Other (See Comments)   unknown   Gabapentin Other (See Comments)   unknown       Medication List    TAKE these medications   acetaminophen 160 MG/5ML solution Commonly known as: TYLENOL Place 31.3 mLs (1,000 mg total) into feeding tube 3 (three) times daily.   albuterol (2.5 MG/3ML) 0.083% nebulizer solution Commonly known as: PROVENTIL Take 2.5 mg by nebulization every 6 (six) hours as needed for wheezing or shortness of breath.   budesonide-formoterol 160-4.5 MCG/ACT inhaler Commonly known as: SYMBICORT Inhale 1 puff into the lungs daily. What changed: when to take this   docusate 50 MG/5ML liquid Commonly known as: COLACE Place 10 mLs (100 mg total) into feeding tube daily. What changed:   when to take this  reasons to take this   OSMOLITE 1.5 CAL PO Take 237 mLs by mouth 5 (five) times daily. What changed: Another medication with the same name was changed. Make sure you understand how and when to take each.   feeding supplement (OSMOLITE 1.2 CAL) Liqd Place 1,000 mLs into feeding tube continuous. What changed: Another medication with the same name was changed. Make sure you understand how and when to take each.   feeding supplement Liqd Take 237 mLs by mouth 2 (two) times daily between meals. What changed: when to take this   fluticasone 50 MCG/ACT nasal spray Commonly known as: FLONASE Place 1 spray into both nostrils daily as needed for allergies or rhinitis.   hydrOXYzine 10 MG/5ML syrup Commonly known as: ATARAX Place 5 mLs (10 mg total) into feeding tube 3 (three) times daily as needed for itching or anxiety. What changed: reasons to take this   mirtazapine 15 MG tablet Commonly known as: REMERON Take 15 mg by mouth at bedtime.   naproxen 250 MG tablet Commonly known as: NAPROSYN Take 1 tablet (250 mg total) by mouth 2 (two) times daily as needed for moderate pain.   ondansetron 8 MG disintegrating tablet Commonly known as: Zofran ODT Take 1 tablet (8 mg total) by mouth every 8 (eight) hours as needed for nausea or vomiting.    pantoprazole sodium 40 mg/20 mL Pack Commonly known as: PROTONIX Place 20 mLs (40 mg total) into feeding tube 2 (two) times daily.   pregabalin 150 MG capsule Commonly known as: LYRICA Take 150 mg by mouth at bedtime.   sucralfate 1 GM/10ML suspension Commonly known as: CARAFATE Place 10 mLs (1 g total) into feeding tube 4 (four) times daily -  with meals and at bedtime.   Tiotropium Bromide Monohydrate 2.5 MCG/ACT Aers Inhale 2 puffs into the lungs daily.   traMADol 50 MG tablet Commonly known as: ULTRAM Take 50 mg by mouth every 12 (twelve) hours as needed for moderate pain (pain). Information from narcotic database       Discharge Instructions: Please refer to Patient Instructions section of EMR for full details.  Patient was counseled important signs and symptoms that should prompt return to medical care, changes in medications, dietary instructions, activity restrictions, and follow up appointments.   Follow-Up Appointments:  Follow-up Information    [facility]   Specialty: Home Health Services Why: For home health services. They will contact you in 1-2 days to set up your home appointment Contact information: [address] [place] [address] [phone]  Patient has follow-up appt with pulmonologist on 08/23/2020  Zola Button, MD 08/06/2020, 8:10 PM PGY-1, [place]

## 2020-08-07 ENCOUNTER — Ambulatory Visit (HOSPITAL_COMMUNITY)
Admission: RE | Admit: 2020-08-07 | Discharge: 2020-08-07 | Disposition: A | Discharge status: Home / Self Care | Payer: HRSA Program | Source: Ambulatory Visit | Attending: Pulmonary Disease | Admitting: Pulmonary Disease

## 2020-08-07 DIAGNOSIS — U071 COVID-19: Secondary | ICD-10-CM | POA: Diagnosis not present

## 2020-08-07 DIAGNOSIS — J1282 Pneumonia due to coronavirus disease 2019: Secondary | ICD-10-CM | POA: Diagnosis not present

## 2020-08-07 MED ORDER — SODIUM CHLORIDE 0.9 % IV SOLN: INTRAVENOUS | Status: DC | PRN

## 2020-08-07 MED ORDER — DIPHENHYDRAMINE HCL 50 MG/ML IJ SOLN: 50.0000 mg | Freq: Once | INTRAMUSCULAR | Status: DC | PRN

## 2020-08-07 MED ORDER — ALBUTEROL SULFATE HFA 108 (90 BASE) MCG/ACT IN AERS: 2.0000 | INHALATION_SPRAY | Freq: Once | RESPIRATORY_TRACT | Status: DC | PRN

## 2020-08-07 MED ORDER — EPINEPHRINE 0.3 MG/0.3ML IJ SOAJ: 0.3000 mg | Freq: Once | INTRAMUSCULAR | Status: DC | PRN

## 2020-08-07 MED ORDER — FAMOTIDINE IN NACL 20-0.9 MG/50ML-% IV SOLN: 20.0000 mg | Freq: Once | INTRAVENOUS | Status: DC | PRN

## 2020-08-07 MED ORDER — SODIUM CHLORIDE 0.9 % IV SOLN
100.0000 mg | Freq: Once | INTRAVENOUS | Status: AC
  Administered 2020-08-07: 100 mg via INTRAVENOUS

## 2020-08-07 MED ORDER — METHYLPREDNISOLONE SODIUM SUCC 125 MG IJ SOLR: 125.0000 mg | Freq: Once | INTRAMUSCULAR | Status: DC | PRN

## 2020-08-07 NOTE — Discharge Instructions (Signed)
10 Things You Can Do to Manage Your COVID-19 Symptoms at Home °If you have possible or confirmed COVID-19: °1. Stay home except to get medical care. °2. Monitor your symptoms carefully. If your symptoms get worse, call your healthcare provider immediately. °3. Get rest and stay hydrated. °4. If you have a medical appointment, call the healthcare provider ahead of time and tell them that you have or may have COVID-19. °5. For medical emergencies, call 911 and notify the dispatch personnel that you have or may have COVID-19. °6. Cover your cough and sneezes with a tissue or use the inside of your elbow. °7. Wash your hands often with soap and water for at least 20 seconds or clean your hands with an alcohol-based hand sanitizer that contains at least 60% alcohol. °8. As much as possible, stay in a specific room and away from other people in your home. Also, you should use a separate bathroom, if available. If you need to be around other people in or outside of the home, wear a mask. °9. Avoid sharing personal items with other people in your household, like dishes, towels, and bedding. °10. Clean all surfaces that are touched often, like counters, tabletops, and doorknobs. Use household cleaning sprays or wipes according to the label instructions. °cdc.gov/coronavirus °01/28/2020 °This information is not intended to replace advice given to you by your health care provider. Make sure you discuss any questions you have with your health care provider. °Document Revised: 05/15/2020 Document Reviewed: 05/15/2020 °Elsevier Patient Education © 2021 Elsevier Inc. °If you have any questions or concerns after the infusion please call the Advanced Practice Provider on call at [phone]. This number is ONLY intended for your use regarding questions or concerns about the infusion post-treatment side-effects.  Please do not provide this number to others for use. For return to work notes please contact your primary care provider.   ° °If someone you know is interested in receiving treatment please have them contact their MD for a referral or visit [email or web address] ° ° ° °

## 2020-08-07 NOTE — Progress Notes (Signed)
Patient reviewed Fact Sheet for Patients, Parents, and Caregivers for Emergency Use Authorization (EUA) of remdesivir for the Treatment of Coronavirus. Patient also reviewed and is agreeable to the estimated cost of treatment. Patient is agreeable to proceed.    

## 2020-08-07 NOTE — Progress Notes (Signed)
  Diagnosis: COVID-19  Physician: Dr. Asencion Noble  Procedure: [facility] Med: remdesivir infusion - Provided patient with remdesivir fact sheet for patients, parents and caregivers prior to infusion.  Complications: No immediate complications noted.  Discharge: Discharged home   Claudia Wiley 08/07/2020

## 2020-08-15 ENCOUNTER — Other Ambulatory Visit (HOSPITAL_COMMUNITY): Payer: Self-pay | Admitting: Radiology

## 2020-08-15 DIAGNOSIS — R633 Feeding difficulties, unspecified: Secondary | ICD-10-CM

## 2020-08-16 ENCOUNTER — Other Ambulatory Visit: Payer: Self-pay

## 2020-08-16 ENCOUNTER — Ambulatory Visit (HOSPITAL_COMMUNITY)
Admission: RE | Admit: 2020-08-16 | Discharge: 2020-08-16 | Disposition: A | Discharge status: Home / Self Care | Payer: No Typology Code available for payment source | Source: Ambulatory Visit | Attending: Radiology | Admitting: Radiology

## 2020-08-16 DIAGNOSIS — R633 Feeding difficulties, unspecified: Secondary | ICD-10-CM

## 2020-08-16 DIAGNOSIS — K222 Esophageal obstruction: Secondary | ICD-10-CM | POA: Diagnosis present

## 2020-08-16 DIAGNOSIS — Z431 Encounter for attention to gastrostomy: Secondary | ICD-10-CM | POA: Insufficient documentation

## 2020-08-16 HISTORY — PX: IR GJ TUBE CHANGE: IMG1440

## 2020-08-16 MED ORDER — SODIUM CHLORIDE (PF) 0.9 % IJ SOLN
INTRAMUSCULAR | Status: AC
  Administered 2020-08-16: 10 mL
  Filled 2020-08-16: qty 10

## 2020-08-16 MED ORDER — LIDOCAINE VISCOUS HCL 2 % MT SOLN
OROMUCOSAL | Status: AC
  Administered 2020-08-16: 20 mL
  Filled 2020-08-16: qty 15

## 2020-08-16 MED ORDER — LIDOCAINE HCL 1 % IJ SOLN
INTRAMUSCULAR | Status: AC
  Filled 2020-08-16: qty 20

## 2020-08-16 MED ORDER — IOHEXOL 300 MG/ML  SOLN
50.0000 mL | Freq: Once | INTRAMUSCULAR | Status: AC | PRN
  Administered 2020-08-16: 25 mL

## 2020-08-16 MED ORDER — IOHEXOL 300 MG/ML  SOLN
50.0000 mL | Freq: Once | INTRAMUSCULAR | Status: AC | PRN
  Administered 2020-08-16: 20 mL

## 2020-08-16 NOTE — Procedures (Addendum)
Interventional Radiology Procedure Note  Procedure: Gastrojejunostomy tube exchange  Findings: Please refer to procedural dictation for full description.  Successful exchange of 24 Fr gastrojejunostomy tube.  10 mL sterile water in retention balloon.  Tip located just past the ligament of Trietz.  Complications: None immediate  Estimated Blood Loss: < 5 mL  Recommendations: Tube ready for immediate use. Due to tortuous anatomy, jejunal arm likely to recoil into stomach over time.  Hopeful that peristalsis will carry weighted tip further into jejunum. Patient experienced significant discomfort during this challenging exchange.  Recommend moderate sedation for future tube exchanges.   Ruthann Cancer, MD Pager: [phone]

## 2020-11-07 ENCOUNTER — Other Ambulatory Visit: Payer: Self-pay

## 2020-11-07 ENCOUNTER — Emergency Department (HOSPITAL_COMMUNITY): Payer: No Typology Code available for payment source

## 2020-11-07 ENCOUNTER — Emergency Department (HOSPITAL_COMMUNITY)
Admission: EM | Admit: 2020-11-07 | Discharge: 2020-11-07 | Disposition: A | Discharge status: Home / Self Care | Payer: No Typology Code available for payment source | Attending: Emergency Medicine | Admitting: Emergency Medicine

## 2020-11-07 ENCOUNTER — Encounter (HOSPITAL_COMMUNITY): Payer: Self-pay

## 2020-11-07 DIAGNOSIS — R000 Tachycardia, unspecified: Secondary | ICD-10-CM | POA: Insufficient documentation

## 2020-11-07 DIAGNOSIS — J441 Chronic obstructive pulmonary disease with (acute) exacerbation: Secondary | ICD-10-CM | POA: Diagnosis not present

## 2020-11-07 DIAGNOSIS — R5383 Other fatigue: Secondary | ICD-10-CM | POA: Insufficient documentation

## 2020-11-07 DIAGNOSIS — R0602 Shortness of breath: Secondary | ICD-10-CM | POA: Diagnosis present

## 2020-11-07 DIAGNOSIS — R630 Anorexia: Secondary | ICD-10-CM | POA: Insufficient documentation

## 2020-11-07 DIAGNOSIS — Z85028 Personal history of other malignant neoplasm of stomach: Secondary | ICD-10-CM | POA: Diagnosis not present

## 2020-11-07 DIAGNOSIS — R531 Weakness: Secondary | ICD-10-CM | POA: Diagnosis not present

## 2020-11-07 DIAGNOSIS — R519 Headache, unspecified: Secondary | ICD-10-CM | POA: Insufficient documentation

## 2020-11-07 DIAGNOSIS — Z8616 Personal history of COVID-19: Secondary | ICD-10-CM | POA: Insufficient documentation

## 2020-11-07 DIAGNOSIS — Z87891 Personal history of nicotine dependence: Secondary | ICD-10-CM | POA: Insufficient documentation

## 2020-11-07 DIAGNOSIS — Z85118 Personal history of other malignant neoplasm of bronchus and lung: Secondary | ICD-10-CM | POA: Insufficient documentation

## 2020-11-07 DIAGNOSIS — R0682 Tachypnea, not elsewhere classified: Secondary | ICD-10-CM | POA: Insufficient documentation

## 2020-11-07 DIAGNOSIS — I10 Essential (primary) hypertension: Secondary | ICD-10-CM | POA: Diagnosis not present

## 2020-11-07 LAB — CBC WITH DIFFERENTIAL/PLATELET
Abs Immature Granulocytes: 0.01 10*3/uL (ref 0.00–0.07)
Basophils Absolute: 0 10*3/uL (ref 0.0–0.1)
Basophils Relative: 0 %
Eosinophils Absolute: 0 10*3/uL (ref 0.0–0.5)
Eosinophils Relative: 0 %
HCT: 50.2 % — ABNORMAL HIGH (ref 36.0–46.0)
Hemoglobin: 15 g/dL (ref 12.0–15.0)
Immature Granulocytes: 0 %
Lymphocytes Relative: 39 %
Lymphs Abs: 3.1 10*3/uL (ref 0.7–4.0)
MCH: 27.5 pg (ref 26.0–34.0)
MCHC: 29.9 g/dL — ABNORMAL LOW (ref 30.0–36.0)
MCV: 92.1 fL (ref 80.0–100.0)
Monocytes Absolute: 0.7 10*3/uL (ref 0.1–1.0)
Monocytes Relative: 9 %
Neutro Abs: 4 10*3/uL (ref 1.7–7.7)
Neutrophils Relative %: 52 %
Platelets: 286 10*3/uL (ref 150–400)
RBC: 5.45 MIL/uL — ABNORMAL HIGH (ref 3.87–5.11)
RDW: 15.7 % — ABNORMAL HIGH (ref 11.5–15.5)
WBC: 7.8 10*3/uL (ref 4.0–10.5)
nRBC: 0 % (ref 0.0–0.2)

## 2020-11-07 LAB — COMPREHENSIVE METABOLIC PANEL
ALT: 24 U/L (ref 0–44)
AST: 27 U/L (ref 15–41)
Albumin: 4.5 g/dL (ref 3.5–5.0)
Alkaline Phosphatase: 96 U/L (ref 38–126)
Anion gap: 10 (ref 5–15)
BUN: 18 mg/dL (ref 8–23)
CO2: 37 mmol/L — ABNORMAL HIGH (ref 22–32)
Calcium: 10.1 mg/dL (ref 8.9–10.3)
Chloride: 98 mmol/L (ref 98–111)
Creatinine, Ser: 0.43 mg/dL — ABNORMAL LOW (ref 0.44–1.00)
GFR, Estimated: >60 mL/min (ref >60)
Glucose, Bld: 93 mg/dL (ref 70–99)
Potassium: 4.2 mmol/L (ref 3.5–5.1)
Sodium: 145 mmol/L (ref 135–145)
Total Bilirubin: 0.4 mg/dL (ref 0.3–1.2)
Total Protein: 8.1 g/dL (ref 6.5–8.1)

## 2020-11-07 MED ORDER — IOHEXOL 350 MG/ML SOLN
100.0000 mL | Freq: Once | INTRAVENOUS | Status: AC | PRN
  Administered 2020-11-07: 55 mL via INTRAVENOUS

## 2020-11-07 MED ORDER — MORPHINE SULFATE (PF) 2 MG/ML IV SOLN
2.0000 mg | Freq: Once | INTRAVENOUS | Status: AC
Start: 2020-11-07 — End: 2020-11-07
  Administered 2020-11-07: 2 mg via INTRAVENOUS
  Filled 2020-11-07: qty 1

## 2020-11-07 MED ORDER — LORAZEPAM 0.5 MG PO TABS: 0.5000 mg | ORAL_TABLET | Freq: Three times a day (TID) | ORAL | 0 refills | Status: AC | PRN

## 2020-11-07 MED ORDER — PREDNISONE 20 MG PO TABS: 40.0000 mg | ORAL_TABLET | Freq: Every day | ORAL | 0 refills | Status: DC

## 2020-11-07 MED ORDER — LORAZEPAM 2 MG/ML IJ SOLN
0.5000 mg | Freq: Once | INTRAMUSCULAR | Status: AC
  Administered 2020-11-07: 0.5 mg via INTRAVENOUS
  Filled 2020-11-07: qty 1

## 2020-11-07 MED ORDER — PROCHLORPERAZINE EDISYLATE 10 MG/2ML IJ SOLN
5.0000 mg | Freq: Once | INTRAMUSCULAR | Status: AC
  Administered 2020-11-07: 5 mg via INTRAVENOUS
  Filled 2020-11-07: qty 2

## 2020-11-07 MED ORDER — SODIUM CHLORIDE 0.9 % IV BOLUS
1000.0000 mL | Freq: Once | INTRAVENOUS | Status: AC
Start: 2020-11-07 — End: 2020-11-07
  Administered 2020-11-07: 1000 mL via INTRAVENOUS

## 2020-11-07 MED ORDER — DEXAMETHASONE SODIUM PHOSPHATE 10 MG/ML IJ SOLN
8.0000 mg | Freq: Once | INTRAMUSCULAR | Status: AC
  Administered 2020-11-07: 8 mg via INTRAVENOUS
  Filled 2020-11-07: qty 1

## 2020-11-07 MED ORDER — KETOROLAC TROMETHAMINE 30 MG/ML IJ SOLN
15.0000 mg | Freq: Once | INTRAMUSCULAR | Status: AC
Start: 2020-11-07 — End: 2020-11-07
  Administered 2020-11-07: 15 mg via INTRAVENOUS
  Filled 2020-11-07: qty 1

## 2020-11-07 NOTE — Discharge Instructions (Addendum)
As discussed, today's evaluation has been generally reassuring.  There is no evidence for pulmonary embolism, pneumonia, although your CT scan did demonstrate the nodule that you are having evaluated by your physician.  Interpretation of today's CT scan is included below.  Please take medication as prescribed, and do not hesitate to return here for concerning changes otherwise follow-up with your physician.  CT FINDINGS: Cardiovascular: Satisfactory opacification of the pulmonary arteries to the segmental level. No evidence of pulmonary embolism. Normal heart size. No pericardial effusion. Mediastinum/Nodes: No enlarged mediastinal, hilar, or axillary lymph nodes. Thyroid gland, trachea, and esophagus demonstrate no significant findings. Lungs/Pleura: No pneumothorax or pleural effusion is noted. 8 x 3 mm irregular density is noted in right upper lobe best seen on image number 35 of series 8. It is adjacent to focal bleb or bulla. Upper Abdomen: No acute abnormality. Musculoskeletal: No chest wall abnormality. No acute or significant osseous findings. Review of the MIP images confirms the above findings. IMPRESSION: No definite evidence of pulmonary embolus. 6 mm irregular nodular density is noted in right upper lobe.

## 2020-11-07 NOTE — ED Triage Notes (Addendum)
Patient states shob, headache, weakness, Nausea, pain that feels like gas in abd x4 days. Hx of copd, upper left lobectomy, lung cancer. 2lpm [place] at home baseline

## 2020-11-07 NOTE — ED Notes (Signed)
Patient and family requesting to speak to MD and something for ativan

## 2020-11-07 NOTE — ED Provider Notes (Signed)
[facility] DEPT Provider Note   CSN: [id number] Arrival date & time: 11/07/20  [id number]     History Chief Complaint  Patient presents with  . Headache  . Shortness of Breath  . Weakness    Claudia Wiley is a 65 y.o. female.  HPI Patient with multiple medical issues, admission earlier this year, now presents with headache, fatigue, anorexia, dyspnea. Problem list is notable, including COVID-19 in January, COPD, lung cancer status post lobectomy, gastrinoma, SMA syndrome. This illness began about 4 days ago, since that time patient has had minimal intake either orally or through her GJ tube.  There is associated, worsening weakness, with new bedridden status. Patient does have dyspnea, though it is unclear if this is notable departure from baseline as she is on 2 L via nasal cannula, continues to require this amount now. No report of fever, no report of vomiting. No clear precipitant for this new weakness. Patient does have recently identified possible left upper lobe nodule, and is scheduled for follow-up in 2 weeks. She is here with her daughter who assists with the history.    Past Medical History:  Diagnosis Date  . Anemia   . Arthritis   . Cancer [facility]) 2013   Upper left lung and stomach cancer  . COPD (chronic obstructive pulmonary disease) ([place])   . Esophageal stricture   . Family history of brain cancer   . Family history of melanoma   . Gastrinoma   . Gastrinoma   . GERD (gastroesophageal reflux disease)   . Hypertension   . Osteoporosis   . Pancreatic insufficiency   . Pneumonia   . PONV (postoperative nausea and vomiting)   . Vitamin D deficiency   . Weight loss, unintentional     Patient Active Problem List   Diagnosis Date Noted  . COVID-19 08/03/2020  . COVID-19 virus infection 08/03/2020  . Prediabetes 04/17/2020  . Normocytic anemia 04/14/2020  . Duodenal stenosis   . SMAS (superior mesenteric artery syndrome)  ([place]) 04/07/2020  . Gastric outlet obstruction   . Dysphagia   . Protein-calorie malnutrition, severe 10/06/2019  . Orthostasis 10/01/2019  . Candida esophagitis ([place]) 10/01/2019  . Odynophagia   . Leukocytosis   . Esophagitis   . Esophageal dysphagia   . Esophageal stricture   . Respiratory failure ([place]) 09/09/2019  . COPD (chronic obstructive pulmonary disease) ([place]) 06/30/2019  . Acute on chronic respiratory failure with hypoxia and hypercapnia ([place]) 06/29/2019  . GERD (gastroesophageal reflux disease)   . Pancreatic insufficiency   . Nausea & vomiting 04/03/2019  . Abdominal pain 04/03/2019  . Abnormal finding on urinalysis 04/03/2019  . Acute respiratory failure with hypoxia and hypercapnia ([place]) 04/02/2019  . COPD exacerbation ([place]) 06/15/2018  . Essential hypertension 06/15/2018  . Genetic testing 06/01/2018  . Gastrinoma   . Family history of melanoma   . Family history of brain cancer   . Hypokalemia 10/19/2011  . Unspecified protein-calorie malnutrition ([place]) 10/19/2011  . Weight loss 10/19/2011  . Generalized weakness 10/19/2011  . Anodontia 10/19/2011    Past Surgical History:  Procedure Laterality Date  . ABDOMINAL HYSTERECTOMY    . BIOPSY  09/11/2019   Procedure: BIOPSY;  Surgeon: Lavena Bullion, DO;  Location: [facility];  Service: Gastroenterology;;  . BIOPSY  10/03/2019   Procedure: BIOPSY;  Surgeon: Jerene Bears, MD;  [facility]: [facility];  Service: Gastroenterology;;  . BIOPSY  04/07/2020   Procedure: BIOPSY;  Surgeon: Lavena Bullion,  DO;  [facility];  Service: Gastroenterology;;  . BREAST LUMPECTOMY    . CESAREAN SECTION    . ESOPHAGEAL DILATION  03/13/2020   Procedure: ESOPHAGEAL DILATION;  Surgeon: [name], MD;  [facility]: [facility];  Service: Endoscopy;;  . ESOPHAGOGASTRODUODENOSCOPY (EGD) WITH PROPOFOL N/A 09/11/2019   Procedure: ESOPHAGOGASTRODUODENOSCOPY (EGD) WITH PROPOFOL;  Surgeon: Lavena Bullion, DO;   [facility];  Service: Gastroenterology;  Laterality: N/A;  . ESOPHAGOGASTRODUODENOSCOPY (EGD) WITH PROPOFOL N/A 10/03/2019   Procedure: ESOPHAGOGASTRODUODENOSCOPY (EGD) WITH PROPOFOL;  Surgeon: Jerene Bears, MD;  [facility];  Service: Gastroenterology;  Laterality: N/A;  . ESOPHAGOGASTRODUODENOSCOPY (EGD) WITH PROPOFOL N/A 10/19/2019   Procedure: ESOPHAGOGASTRODUODENOSCOPY (EGD) WITH PROPOFOL;  Surgeon: Yetta Flock, MD;  [facility];  Service: Gastroenterology;  Laterality: N/A;  possible dilation  . ESOPHAGOGASTRODUODENOSCOPY (EGD) WITH PROPOFOL N/A 11/02/2019   Procedure: ESOPHAGOGASTRODUODENOSCOPY (EGD) WITH FLUORO AND  WITH PROPOFOL;  Surgeon: [name], MD;  [facility];  Service: Endoscopy;  Laterality: N/A;  need fluoro  . ESOPHAGOGASTRODUODENOSCOPY (EGD) WITH PROPOFOL N/A 11/16/2019   Procedure: ESOPHAGOGASTRODUODENOSCOPY (EGD) WITH PROPOFOL;  Surgeon: [name], MD;  [facility];  Service: Endoscopy;  Laterality: N/A;  . ESOPHAGOGASTRODUODENOSCOPY (EGD) WITH PROPOFOL N/A 11/29/2019   Procedure: ESOPHAGOGASTRODUODENOSCOPY (EGD) WITH PROPOFOL;  Surgeon: [name], MD;  [facility];  Service: Endoscopy;  Laterality: N/A;  need fluoro  . ESOPHAGOGASTRODUODENOSCOPY (EGD) WITH PROPOFOL N/A 12/21/2019   Procedure: ESOPHAGOGASTRODUODENOSCOPY (EGD) WITH PROPOFOL;  Surgeon: [name], MD;  [facility];  Service: Endoscopy;  Laterality: N/A;  . ESOPHAGOGASTRODUODENOSCOPY (EGD) WITH PROPOFOL N/A 02/07/2020   Procedure: ESOPHAGOGASTRODUODENOSCOPY (EGD) WITH PROPOFOL;  Surgeon: [name], MD;  [facility];  Service: Endoscopy;  Laterality: N/A;  . ESOPHAGOGASTRODUODENOSCOPY (EGD) WITH PROPOFOL N/A 03/13/2020   Procedure: ESOPHAGOGASTRODUODENOSCOPY (EGD) WITH PROPOFOL;  Surgeon: [name], MD;  [facility];  Service: Endoscopy;  Laterality: N/A;  With dilation  .  ESOPHAGOGASTRODUODENOSCOPY (EGD) WITH PROPOFOL N/A 04/07/2020   Procedure: ESOPHAGOGASTRODUODENOSCOPY (EGD) WITH PROPOFOL;  Surgeon: Lavena Bullion, DO;  [facility];  Service: Gastroenterology;  Laterality: N/A;  . ESOPHAGOGASTRODUODENOSCOPY (EGD) WITH PROPOFOL N/A 04/25/2020   Procedure: ESOPHAGOGASTRODUODENOSCOPY (EGD) WITH PROPOFOL;  Surgeon: [name], MD;  [facility];  Service: Endoscopy;  Laterality: N/A;  . IR GASTR TUBE CONVERT GASTR-JEJ PER W/FL MOD SED  04/18/2020  . IR GASTROSTOMY TUBE MOD SED  04/13/2020  . IR GJ TUBE CHANGE  08/16/2020  . IR RADIOLOGIST EVAL & MGMT  05/03/2020  . LUNG SURGERY    . SAVORY DILATION N/A 10/19/2019   Procedure: SAVORY DILATION;  Surgeon: Yetta Flock, MD;  [facility];  Service: Gastroenterology;  Laterality: N/A;  . SAVORY DILATION N/A 11/02/2019   Procedure: SAVORY DILATION;  Surgeon: [name], MD;  [facility];  Service: Endoscopy;  Laterality: N/A;  fluoro  . SAVORY DILATION N/A 11/16/2019   Procedure: SAVORY DILATION;  Surgeon: [name], MD;  [facility];  Service: Endoscopy;  Laterality: N/A;  . SAVORY DILATION N/A 11/29/2019   Procedure: SAVORY DILATION;  Surgeon: [name], MD;  [facility];  Service: Endoscopy;  Laterality: N/A;  . SAVORY DILATION N/A 12/21/2019   Procedure: SAVORY DILATION;  Surgeon: [name], MD;  [facility];  Service: Endoscopy;  Laterality: N/A;  . SAVORY DILATION N/A 02/07/2020   Procedure: SAVORY DILATION;  Surgeon: Lucio Edward  T, MD;  [facility];  Service: Endoscopy;  Laterality: N/A;  . SAVORY DILATION N/A 04/25/2020   Procedure: SAVORY DILATION;  Surgeon: [name], MD;  [facility];  Service: Endoscopy;  Laterality: N/A;     OB History   No obstetric history on file.     Family History  Problem Relation Age of Onset  . Diabetes Sister        x 3  . Cancer Sister 66        brain that metastized to bone, stomach and other organs  . Melanoma Maternal Aunt   . Heart attack Father   . Pneumonia Brother   . Cancer Other 17       cancer in his chest - nephew  . Colon cancer Neg Hx   . Pancreatic cancer Neg Hx   . Rectal cancer Neg Hx   . Esophageal cancer Neg Hx     Social History   Tobacco Use  . Smoking status: Former Smoker    Packs/day: 0.25    Years: 2.00    Pack years: 0.50    Types: Cigarettes    Quit date: 05/30/2019    Years since quitting: 1.4  . Smokeless tobacco: Never Used  . Tobacco comment: 2 months- quit date  Vaping Use  . Vaping Use: Never used  Substance Use Topics  . Alcohol use: No  . Drug use: No    Home Medications Prior to Admission medications   Medication Sig Start Date End Date Taking? Authorizing Provider  acetaminophen (TYLENOL) 160 MG/5ML solution Place 31.3 mLs (1,000 mg total) into feeding tube 3 (three) times daily. Patient taking differently: Take 1,000 mg by mouth 3 (three) times daily. 08/06/20  Yes Alcus Dad, MD  albuterol (PROVENTIL) (2.5 MG/3ML) 0.083% nebulizer solution Take 2.5 mg by nebulization every 6 (six) hours as needed for wheezing or shortness of breath.   Yes [provider]  budesonide-formoterol (SYMBICORT) 160-4.5 MCG/ACT inhaler Inhale 1 puff into the lungs daily. Patient taking differently: Inhale 1 puff into the lungs in the morning and at bedtime. 12/21/19  Yes [name], MD  docusate (COLACE) 50 MG/5ML liquid Place 10 mLs (100 mg total) into feeding tube daily. Patient taking differently: Place 100 mg into feeding tube daily as needed for mild constipation. 04/20/20  Yes Donne Hazel, MD  fluticasone [facility]) 50 MCG/ACT nasal spray Place 1 spray into both nostrils daily as needed for allergies or rhinitis.   Yes [provider]  hydrOXYzine (ATARAX) 10 MG/5ML syrup Place 5 mLs (10 mg total) into feeding tube 3 (three) times daily as needed for itching or  anxiety. Patient taking differently: Take 10 mg by mouth 3 (three) times daily as needed for itching or anxiety. 08/06/20  Yes Alcus Dad, MD  mirtazapine (REMERON) 15 MG tablet Take 15 mg by mouth at bedtime. Crush and  put in pudding   Yes [provider]  Nutritional Supplements (KATE FARMS STANDARD 1.4) LIQD TAKE 50 ML PER HOUR FOR 20 HOURS BY J TUBE CONTINUOUS VIA PUMP SET PUMP TO PROVIDE WATER FLUSHES OF 20 ML EVERY OTHER HOUR DURING   CONTINOUS FEEDING. INCREASE TO 20 ML WATER EVERY HOUR IF SHOWING SIGNS OF  DEHYDRATION OR NOT DRINKING AS MUCH BY MOUTH. KEEP  ELEVATED  AT LEAST 45 DEGREES.  FOODS AND FLUIDS  AS DIRECTED BY PROVIDER. SET PUMP TO PROVIDE WATER FLUSHES OF 20 ML EVERY OTHER HOUR DURING  CONTINOUS FEEDING. INCREASE TO 20 ML WATER EVERY HOUR IF SHOWING SIGNS OF   DEHYDRATION OR NOT DRINKING AS MUCH BY MOUTH. KEEP   ELEVATED  AT LEAST 45 DEGREES.  FOODS AND FLUIDS  AS DIRECTED BY PROVIDER. 10/24/20  Yes [provider]  ondansetron (ZOFRAN ODT) 8 MG disintegrating tablet Take 1 tablet (8 mg total) by mouth every 8 (eight) hours as needed for nausea or vomiting. 04/21/20  Yes Lacretia Leigh, MD  pantoprazole sodium (PROTONIX) 40 mg/20 mL PACK Place 20 mLs (40 mg total) into feeding tube 2 (two) times daily. Patient taking differently: Take 40 mg by mouth 2 (two) times daily. Put in pudding 04/19/20  Yes Donne Hazel, MD  pregabalin (LYRICA) 150 MG capsule Take 150 mg by mouth at bedtime. Put in pudding   Yes [provider]  sucralfate (CARAFATE) 1 GM/10ML suspension Place 10 mLs (1 g total) into feeding tube 4 (four) times daily -  with meals and at bedtime. Patient taking differently: Take 1 g by mouth 4 (four) times daily -  with meals and at bedtime. 04/19/20  Yes Donne Hazel, MD  Tiotropium Bromide Monohydrate 2.5 MCG/ACT AERS Inhale 2 puffs into the lungs daily.    Yes [provider]  traMADol (ULTRAM) 50 MG tablet Take 50 mg by mouth  every 12 (twelve) hours as needed for moderate pain (pain). Information from narcotic database  crush put in pudding   Yes [provider]  feeding supplement, ENSURE ENLIVE, (ENSURE ENLIVE) LIQD Take 237 mLs by mouth 2 (two) times daily between meals. Patient not taking: No sig reported 04/19/20   Donne Hazel, MD  naproxen (NAPROSYN) 250 MG tablet Take 1 tablet (250 mg total) by mouth 2 (two) times daily as needed for moderate pain. Patient not taking: Reported on 11/07/2020 08/06/20   Alcus Dad, MD  Nutritional Supplements (FEEDING SUPPLEMENT, OSMOLITE 1.2 CAL,) LIQD Place 1,000 mLs into feeding tube continuous. Patient not taking: No sig reported 04/19/20   Donne Hazel, MD    Allergies    Azithromycin, Boniva [ibandronic acid], and Gabapentin  Review of Systems   Review of Systems  Constitutional:       Per HPI, otherwise negative  HENT:       Per HPI, otherwise negative  Respiratory:       Per HPI, otherwise negative  Cardiovascular:       Per HPI, otherwise negative  Gastrointestinal: Positive for abdominal pain and nausea. Negative for vomiting.  Endocrine:       Negative aside from HPI  Genitourinary:       Neg aside from HPI   Musculoskeletal:       Per HPI, otherwise negative  Skin: Negative.   Allergic/Immunologic: Positive for immunocompromised state.  Neurological: Positive for weakness. Negative for syncope.    Physical Exam Updated Vital Signs BP 107/74   Pulse (!) 102   Temp 99.2 F (37.3 C)   Resp (!) 22   Ht 4\' 11"  (1.499 m)   Wt 38.1 kg   SpO2 100%   BMI 16.97 kg/m   Physical Exam Vitals and nursing note reviewed.  HENT:     Head: Normocephalic and atraumatic.  Eyes:     Conjunctiva/sclera: Conjunctivae normal.  Cardiovascular:     Rate and Rhythm: Regular rhythm. Tachycardia present.  Pulmonary:     Effort: Pulmonary effort is normal. Tachypnea present. No respiratory distress.     Breath sounds: Normal breath sounds.  No  stridor.  Abdominal:     General: There is no distension.     Tenderness: There is no abdominal tenderness. There is no guarding.     Comments: GJ tube unremarkable in appearance, no evidence for malfunction, no surrounding erythema, no drainage.  Skin:    General: Skin is warm and dry.  Neurological:     Mental Status: She is alert and oriented to person, place, and time.     Cranial Nerves: No cranial nerve deficit.     ED Results / Procedures / Treatments   Labs (all labs ordered are listed, but only abnormal results are displayed) Labs Reviewed  COMPREHENSIVE METABOLIC PANEL - Abnormal; Notable for the following components:      Result Value   CO2 37 (*)    Creatinine, Ser 0.43 (*)    All other components within normal limits  CBC WITH DIFFERENTIAL/PLATELET - Abnormal; Notable for the following components:   RBC 5.45 (*)    HCT 50.2 (*)    MCHC 29.9 (*)    RDW 15.7 (*)    All other components within normal limits  URINALYSIS, ROUTINE W REFLEX MICROSCOPIC    Radiology DG Chest 2 View  Result Date: 11/07/2020 CLINICAL DATA:  Shortness of breath, status post lobectomy. EXAM: CHEST - 2 VIEW COMPARISON:  August 03, 2020. FINDINGS: The heart size and mediastinal contours are within normal limits. Both lungs are clear. No pneumothorax or pleural effusion is noted. Hyperexpansion of the lungs is noted. The visualized skeletal structures are unremarkable. IMPRESSION: No active cardiopulmonary disease. Electronically Signed   By: Marijo Conception M.D.   On: 11/07/2020 10:42   CT Angio Chest PE W/Cm &/Or Wo Cm  Result Date: 11/07/2020 CLINICAL DATA:  Shortness of breath. EXAM: CT ANGIOGRAPHY CHEST WITH CONTRAST TECHNIQUE: Multidetector CT imaging of the chest was performed using the standard protocol during bolus administration of intravenous contrast. Multiplanar CT image reconstructions and MIPs were obtained to evaluate the vascular anatomy. CONTRAST:  1mL OMNIPAQUE IOHEXOL 350  MG/ML SOLN COMPARISON:  Radiograph of same day. FINDINGS: Cardiovascular: Satisfactory opacification of the pulmonary arteries to the segmental level. No evidence of pulmonary embolism. Normal heart size. No pericardial effusion. Mediastinum/Nodes: No enlarged mediastinal, hilar, or axillary lymph nodes. Thyroid gland, trachea, and esophagus demonstrate no significant findings. Lungs/Pleura: No pneumothorax or pleural effusion is noted. 8 x 3 mm irregular density is noted in right upper lobe best seen on image number 35 of series 8. It is adjacent to focal bleb or bulla. Upper Abdomen: No acute abnormality. Musculoskeletal: No chest wall abnormality. No acute or significant osseous findings. Review of the MIP images confirms the above findings. IMPRESSION: No definite evidence of pulmonary embolus. 6 mm irregular nodular density is noted in right upper lobe. Non-contrast chest CT at 6-12 months is recommended. If the nodule is stable at time of repeat CT, then future CT at 18-24 months (from today's scan) is considered optional for low-risk patients, but is recommended for high-risk patients. This recommendation follows the consensus statement: Guidelines for Management of Incidental Pulmonary Nodules Detected on CT Images: From the Fleischner Society 2017; Radiology 2017; 284:228-243. Aortic Atherosclerosis (ICD10-I70.0). Electronically Signed   By: Marijo Conception M.D.   On: 11/07/2020 15:51    Procedures Procedures   Medications Ordered in ED Medications  sodium chloride 0.9 % bolus 1,000 mL (0 mLs Intravenous Stopped 11/07/20 1454)  prochlorperazine (COMPAZINE) injection 5 mg (5 mg Intravenous Given 11/07/20 1038)  ketorolac (TORADOL) 30 MG/ML injection 15 mg (15 mg Intravenous Given 11/07/20 1122)  morphine 2 MG/ML injection 2 mg (2 mg Intravenous Given 11/07/20 1121)  dexamethasone (DECADRON) injection 8 mg (8 mg Intravenous Given 11/07/20 1122)  LORazepam (ATIVAN) injection 0.5 mg (0.5 mg Intravenous  Given 11/07/20 1356)  iohexol (OMNIPAQUE) 350 MG/ML injection 100 mL (55 mLs Intravenous Contrast Given 11/07/20 1508)    ED Course  I have reviewed the triage vital signs and the nursing notes.  Pertinent labs & imaging results that were available during my care of the patient were reviewed by me and considered in my medical decision making (see chart for details).  Patient placed on continuous cardiac monitoring, pulse oximetry. Cardiac 110, sinus tach, abnormal Pulse oximetry 99%, 2 L via nasal cannula, unchanged from baseline, but abnormal  Update: On repeat exam the patient remains in similar condition.  Headache has not changed appreciably.  Update:, X-ray reviewed, no pneumonia, but with increased work of breathing, malignancy, CT scan has been ordered.  4:31 PM Patient substantially better, now resting, on 2 L, her home oxygen level she has 99% saturation.  She awakens easily, states that she feels better.  I reviewed the patient's CT scan and discussed with the patient, and via telephone with her daughter who is here with the patient previously. No evidence for pulmonary embolism, no evidence for pneumonia, no pleural effusion. Patient does have a previously recognized lesion, which she is scheduled to have follow-up for consultation and 2 weeks.  With no new oxygen requirement, improvement here clinically, the patient is appropriate for discharge, with close outpatient follow-up. MDM Rules/Calculators/A&P MDM Number of Diagnoses or Management Options Bad headache: new, needed workup SOB (shortness of breath): established, worsening   Amount and/or Complexity of Data Reviewed Clinical lab tests: reviewed and ordered Tests in the radiology section of CPT: ordered and reviewed Tests in the medicine section of CPT: ordered and reviewed Decide to obtain previous medical records or to obtain history from someone other than the patient: yes Obtain history from someone other than  the patient: yes Review and summarize past medical records: yes Independent visualization of images, tracings, or specimens: yes  Risk of Complications, Morbidity, and/or Mortality Presenting problems: high Diagnostic procedures: high Management options: high  Critical Care Total time providing critical care: < 30 minutes  Patient Progress Patient progress: stable  Final Clinical Impression(s) / ED Diagnoses Final diagnoses:  SOB (shortness of breath)  Bad headache    Rx / DC Orders ED Discharge Orders         Ordered    predniSONE (DELTASONE) 20 MG tablet  Daily with breakfast        11/07/20 1635    LORazepam (ATIVAN) 0.5 MG tablet  Every 8 hours PRN        11/07/20 1635           Carmin Muskrat, MD 11/07/20 1637

## 2020-11-07 NOTE — ED Notes (Signed)
MD in room at this time.

## 2020-11-09 ENCOUNTER — Emergency Department (HOSPITAL_COMMUNITY): Payer: No Typology Code available for payment source

## 2020-11-09 ENCOUNTER — Other Ambulatory Visit: Payer: Self-pay

## 2020-11-09 ENCOUNTER — Encounter (HOSPITAL_COMMUNITY): Payer: Self-pay

## 2020-11-09 ENCOUNTER — Emergency Department (HOSPITAL_COMMUNITY)
Admission: EM | Admit: 2020-11-09 | Discharge: 2020-11-10 | Disposition: A | Discharge status: Home / Self Care | Payer: No Typology Code available for payment source | Attending: Emergency Medicine | Admitting: Emergency Medicine

## 2020-11-09 DIAGNOSIS — R34 Anuria and oliguria: Secondary | ICD-10-CM | POA: Diagnosis not present

## 2020-11-09 DIAGNOSIS — Z20822 Contact with and (suspected) exposure to covid-19: Secondary | ICD-10-CM | POA: Diagnosis not present

## 2020-11-09 DIAGNOSIS — R531 Weakness: Secondary | ICD-10-CM | POA: Diagnosis not present

## 2020-11-09 DIAGNOSIS — Z7951 Long term (current) use of inhaled steroids: Secondary | ICD-10-CM | POA: Insufficient documentation

## 2020-11-09 DIAGNOSIS — Z85118 Personal history of other malignant neoplasm of bronchus and lung: Secondary | ICD-10-CM | POA: Insufficient documentation

## 2020-11-09 DIAGNOSIS — R000 Tachycardia, unspecified: Secondary | ICD-10-CM | POA: Diagnosis not present

## 2020-11-09 DIAGNOSIS — R112 Nausea with vomiting, unspecified: Secondary | ICD-10-CM | POA: Diagnosis not present

## 2020-11-09 DIAGNOSIS — Z8616 Personal history of COVID-19: Secondary | ICD-10-CM | POA: Insufficient documentation

## 2020-11-09 DIAGNOSIS — R0600 Dyspnea, unspecified: Secondary | ICD-10-CM | POA: Insufficient documentation

## 2020-11-09 DIAGNOSIS — J449 Chronic obstructive pulmonary disease, unspecified: Secondary | ICD-10-CM | POA: Insufficient documentation

## 2020-11-09 DIAGNOSIS — R519 Headache, unspecified: Secondary | ICD-10-CM | POA: Diagnosis not present

## 2020-11-09 DIAGNOSIS — I10 Essential (primary) hypertension: Secondary | ICD-10-CM | POA: Diagnosis not present

## 2020-11-09 DIAGNOSIS — Z87891 Personal history of nicotine dependence: Secondary | ICD-10-CM | POA: Insufficient documentation

## 2020-11-09 DIAGNOSIS — R1084 Generalized abdominal pain: Secondary | ICD-10-CM | POA: Diagnosis present

## 2020-11-09 LAB — CBC
HCT: 50.7 % — ABNORMAL HIGH (ref 36.0–46.0)
Hemoglobin: 15.3 g/dL — ABNORMAL HIGH (ref 12.0–15.0)
MCH: 27.9 pg (ref 26.0–34.0)
MCHC: 30.2 g/dL (ref 30.0–36.0)
MCV: 92.3 fL (ref 80.0–100.0)
Platelets: 257 10*3/uL (ref 150–400)
RBC: 5.49 MIL/uL — ABNORMAL HIGH (ref 3.87–5.11)
RDW: 15.4 % (ref 11.5–15.5)
WBC: 9.7 10*3/uL (ref 4.0–10.5)
nRBC: 0 % (ref 0.0–0.2)

## 2020-11-09 LAB — COMPREHENSIVE METABOLIC PANEL
ALT: 20 U/L (ref 0–44)
AST: 21 U/L (ref 15–41)
Albumin: 4 g/dL (ref 3.5–5.0)
Alkaline Phosphatase: 91 U/L (ref 38–126)
Anion gap: 9 (ref 5–15)
BUN: 20 mg/dL (ref 8–23)
CO2: 38 mmol/L — ABNORMAL HIGH (ref 22–32)
Calcium: 9.4 mg/dL (ref 8.9–10.3)
Chloride: 97 mmol/L — ABNORMAL LOW (ref 98–111)
Creatinine, Ser: 0.46 mg/dL (ref 0.44–1.00)
GFR, Estimated: >60 mL/min (ref >60)
Glucose, Bld: 145 mg/dL — ABNORMAL HIGH (ref 70–99)
Potassium: 4 mmol/L (ref 3.5–5.1)
Sodium: 144 mmol/L (ref 135–145)
Total Bilirubin: 0.5 mg/dL (ref 0.3–1.2)
Total Protein: 7.2 g/dL (ref 6.5–8.1)

## 2020-11-09 LAB — LIPASE, BLOOD: Lipase: 26 U/L (ref 11–51)

## 2020-11-09 MED ORDER — SODIUM CHLORIDE 0.9 % IV BOLUS
1000.0000 mL | Freq: Once | INTRAVENOUS | Status: AC
  Administered 2020-11-09: 1000 mL via INTRAVENOUS

## 2020-11-09 MED ORDER — MORPHINE SULFATE (PF) 4 MG/ML IV SOLN
4.0000 mg | Freq: Once | INTRAVENOUS | Status: AC
  Administered 2020-11-09: 4 mg via INTRAVENOUS
  Filled 2020-11-09: qty 1

## 2020-11-09 MED ORDER — IOHEXOL 300 MG/ML  SOLN
100.0000 mL | Freq: Once | INTRAMUSCULAR | Status: AC | PRN
  Administered 2020-11-09: 100 mL via INTRAVENOUS

## 2020-11-09 MED ORDER — ONDANSETRON HCL 4 MG/2ML IJ SOLN
4.0000 mg | Freq: Once | INTRAMUSCULAR | Status: AC
  Administered 2020-11-09: 4 mg via INTRAVENOUS
  Filled 2020-11-09: qty 2

## 2020-11-09 NOTE — ED Provider Notes (Signed)
MSE was initiated and I personally evaluated the patient and placed orders (if any) at  9:16 PM on November 09, 2020.  The patient appears stable so that the remainder of the MSE may be completed by another provider.   Chief Complaint:  Nausea vomiting   HPI:   Patient states that she was seen here yesterday, daughter is able to give most of HPI.  Reports shortness of breath, headache, weakness and vomiting, primarily came back here after being seen yesterday because her weakness started worsening and now she is expelling everything from her feeding tube which did not happen yesterday.  Is unable to keep anything down.   ROS:  Nausea, vomiting, headache, weakness, shortness of breath.   Physical Exam:  Gen:                 Looks chronically ill HEENT:          Atraumatic  Resp:              Normal effort  Cardiac:          Tachycardic  Abd:                Nondistended, nontender, PEG in place MSK:              Moves extremities without difficulty  Neuro:            Speech clear,EOMS intact      Initiation of care has begun. The patient has been counseled on the process, plan, and necessity for staying for the completion/evaluation, and the remainder of the medical screening examination    Claudia Wiley, Claudia Wiley 11/09/20 2118    Daleen Bo, MD 11/14/20 1015

## 2020-11-09 NOTE — ED Provider Notes (Signed)
[facility] DEPT Provider Note   CSN: [id number] Arrival date & time: 11/09/20  2054     History Chief Complaint  Patient presents with  . Abdominal Pain    Claudia Wiley is a 65 y.o. female with a hx of COPD, chronic respiratory failure on 2L via [place] @ baseline, GERD, hypertension, lung cancer S/p left lobectomy, gastrinoma, G tube, & SMA syndrome who returns to the ED with complaints of generally not feeling well over the past 5 days. Patient reports she has been sick with headache (gradual onset, steady progression, frontal, similar to prior migraines), chills, sweats, generalized weakness, dyspnea, nausea, vomiting, and generalized more so central abdominal pain. Pain in the head & abdomen is constant. Not keeping anything down today. Had some contents spill from her G tube while vomiting. Had diarrhea last night, no BM today. Decreased urination today but no dysuria. Denies fever, syncope, hematemesis, chest pain, or hemoptysis.    HPI     Past Medical History:  Diagnosis Date  . Anemia   . Arthritis   . Cancer [facility]) 2013   Upper left lung and stomach cancer  . COPD (chronic obstructive pulmonary disease) ([place])   . Esophageal stricture   . Family history of brain cancer   . Family history of melanoma   . Gastrinoma   . Gastrinoma   . GERD (gastroesophageal reflux disease)   . Hypertension   . Osteoporosis   . Pancreatic insufficiency   . Pneumonia   . PONV (postoperative nausea and vomiting)   . Vitamin D deficiency   . Weight loss, unintentional     Patient Active Problem List   Diagnosis Date Noted  . COVID-19 08/03/2020  . COVID-19 virus infection 08/03/2020  . Prediabetes 04/17/2020  . Normocytic anemia 04/14/2020  . Duodenal stenosis   . SMAS (superior mesenteric artery syndrome) ([place]) 04/07/2020  . Gastric outlet obstruction   . Dysphagia   . Protein-calorie malnutrition, severe 10/06/2019  . Orthostasis 10/01/2019   . Candida esophagitis ([place]) 10/01/2019  . Odynophagia   . Leukocytosis   . Esophagitis   . Esophageal dysphagia   . Esophageal stricture   . Respiratory failure ([place]) 09/09/2019  . COPD (chronic obstructive pulmonary disease) ([place]) 06/30/2019  . Acute on chronic respiratory failure with hypoxia and hypercapnia ([place]) 06/29/2019  . GERD (gastroesophageal reflux disease)   . Pancreatic insufficiency   . Nausea & vomiting 04/03/2019  . Abdominal pain 04/03/2019  . Abnormal finding on urinalysis 04/03/2019  . Acute respiratory failure with hypoxia and hypercapnia ([place]) 04/02/2019  . COPD exacerbation ([place]) 06/15/2018  . Essential hypertension 06/15/2018  . Genetic testing 06/01/2018  . Gastrinoma   . Family history of melanoma   . Family history of brain cancer   . Hypokalemia 10/19/2011  . Unspecified protein-calorie malnutrition ([place]) 10/19/2011  . Weight loss 10/19/2011  . Generalized weakness 10/19/2011  . Anodontia 10/19/2011    Past Surgical History:  Procedure Laterality Date  . ABDOMINAL HYSTERECTOMY    . BIOPSY  09/11/2019   Procedure: BIOPSY;  Surgeon: Lavena Bullion, DO;  Location: [facility];  Service: Gastroenterology;;  . BIOPSY  10/03/2019   Procedure: BIOPSY;  Surgeon: Jerene Bears, MD;  Location: [facility];  Service: Gastroenterology;;  . BIOPSY  04/07/2020   Procedure: BIOPSY;  Surgeon: Lavena Bullion, DO;  Location: [facility];  Service: Gastroenterology;;  . BREAST LUMPECTOMY    . CESAREAN SECTION    . ESOPHAGEAL DILATION  03/13/2020   Procedure: ESOPHAGEAL DILATION;  Surgeon: [name], MD;  Location: [facility];  Service: Endoscopy;;  . ESOPHAGOGASTRODUODENOSCOPY (EGD) WITH PROPOFOL N/A 09/11/2019   Procedure: ESOPHAGOGASTRODUODENOSCOPY (EGD) WITH PROPOFOL;  Surgeon: Lavena Bullion, DO;  Location: [facility];  Service: Gastroenterology;  Laterality: N/A;  . ESOPHAGOGASTRODUODENOSCOPY (EGD) WITH PROPOFOL N/A 10/03/2019    Procedure: ESOPHAGOGASTRODUODENOSCOPY (EGD) WITH PROPOFOL;  Surgeon: Jerene Bears, MD;  Location: [facility];  Service: Gastroenterology;  Laterality: N/A;  . ESOPHAGOGASTRODUODENOSCOPY (EGD) WITH PROPOFOL N/A 10/19/2019   Procedure: ESOPHAGOGASTRODUODENOSCOPY (EGD) WITH PROPOFOL;  Surgeon: Yetta Flock, MD;  [facility]: [facility];  Service: Gastroenterology;  Laterality: N/A;  possible dilation  . ESOPHAGOGASTRODUODENOSCOPY (EGD) WITH PROPOFOL N/A 11/02/2019   Procedure: ESOPHAGOGASTRODUODENOSCOPY (EGD) WITH FLUORO AND  WITH PROPOFOL;  Surgeon: [name], MD;  [facility]: [facility];  Service: Endoscopy;  Laterality: N/A;  need fluoro  . ESOPHAGOGASTRODUODENOSCOPY (EGD) WITH PROPOFOL N/A 11/16/2019   Procedure: ESOPHAGOGASTRODUODENOSCOPY (EGD) WITH PROPOFOL;  Surgeon: [name], MD;  [facility];  Service: Endoscopy;  Laterality: N/A;  . ESOPHAGOGASTRODUODENOSCOPY (EGD) WITH PROPOFOL N/A 11/29/2019   Procedure: ESOPHAGOGASTRODUODENOSCOPY (EGD) WITH PROPOFOL;  Surgeon: [name], MD;  [facility];  Service: Endoscopy;  Laterality: N/A;  need fluoro  . ESOPHAGOGASTRODUODENOSCOPY (EGD) WITH PROPOFOL N/A 12/21/2019   Procedure: ESOPHAGOGASTRODUODENOSCOPY (EGD) WITH PROPOFOL;  Surgeon: [name], MD;  [facility];  Service: Endoscopy;  Laterality: N/A;  . ESOPHAGOGASTRODUODENOSCOPY (EGD) WITH PROPOFOL N/A 02/07/2020   Procedure: ESOPHAGOGASTRODUODENOSCOPY (EGD) WITH PROPOFOL;  Surgeon: [name], MD;  [facility];  Service: Endoscopy;  Laterality: N/A;  . ESOPHAGOGASTRODUODENOSCOPY (EGD) WITH PROPOFOL N/A 03/13/2020   Procedure: ESOPHAGOGASTRODUODENOSCOPY (EGD) WITH PROPOFOL;  Surgeon: [name], MD;  [facility];  Service: Endoscopy;  Laterality: N/A;  With dilation  . ESOPHAGOGASTRODUODENOSCOPY (EGD) WITH PROPOFOL N/A 04/07/2020   Procedure: ESOPHAGOGASTRODUODENOSCOPY (EGD) WITH PROPOFOL;  Surgeon: Lavena Bullion, DO;  [facility];  Service: Gastroenterology;  Laterality: N/A;  . ESOPHAGOGASTRODUODENOSCOPY (EGD) WITH PROPOFOL N/A 04/25/2020   Procedure: ESOPHAGOGASTRODUODENOSCOPY (EGD) WITH PROPOFOL;  Surgeon: [name], MD;  [facility]: [facility];  Service: Endoscopy;  Laterality: N/A;  . IR GASTR TUBE CONVERT GASTR-JEJ PER W/FL MOD SED  04/18/2020  . IR GASTROSTOMY TUBE MOD SED  04/13/2020  . IR GJ TUBE CHANGE  08/16/2020  . IR RADIOLOGIST EVAL & MGMT  05/03/2020  . LUNG SURGERY    . SAVORY DILATION N/A 10/19/2019   Procedure: SAVORY DILATION;  Surgeon: Yetta Flock, MD;  [facility]: [facility];  Service: Gastroenterology;  Laterality: N/A;  . SAVORY DILATION N/A 11/02/2019   Procedure: SAVORY DILATION;  Surgeon: [name], MD;  [facility]: [facility];  Service: Endoscopy;  Laterality: N/A;  fluoro  . SAVORY DILATION N/A 11/16/2019   Procedure: SAVORY DILATION;  Surgeon: [name], MD;  [facility]: [facility];  Service: Endoscopy;  Laterality: N/A;  . SAVORY DILATION N/A 11/29/2019   Procedure: SAVORY DILATION;  Surgeon: [name], MD;  Location: [facility];  Service: Endoscopy;  Laterality: N/A;  . SAVORY DILATION N/A 12/21/2019   Procedure: SAVORY DILATION;  Surgeon: [name], MD;  Location: [facility];  Service: Endoscopy;  Laterality: N/A;  . SAVORY DILATION N/A 02/07/2020   Procedure: SAVORY DILATION;  Surgeon: [name], MD;  Location: [facility];  Service: Endoscopy;  Laterality: N/A;  . SAVORY DILATION N/A 04/25/2020   Procedure: SAVORY DILATION;  Surgeon:  [name], MD;  Location: [facility];  Service: Endoscopy;  Laterality: N/A;     OB History   No obstetric history on file.     Family History  Problem Relation Age of Onset  . Diabetes Sister        x 3  . Cancer Sister 22       brain that metastized to bone, stomach and other organs  . Melanoma Maternal Aunt   . Heart attack Father   . Pneumonia Brother   .  Cancer Other 17       cancer in his chest - nephew  . Colon cancer Neg Hx   . Pancreatic cancer Neg Hx   . Rectal cancer Neg Hx   . Esophageal cancer Neg Hx     Social History   Tobacco Use  . Smoking status: Former Smoker    Packs/day: 0.25    Years: 2.00    Pack years: 0.50    Types: Cigarettes    Quit date: 05/30/2019    Years since quitting: 1.4  . Smokeless tobacco: Never Used  . Tobacco comment: 2 months- quit date  Vaping Use  . Vaping Use: Never used  Substance Use Topics  . Alcohol use: No  . Drug use: No    Home Medications Prior to Admission medications   Medication Sig Start Date End Date Taking? Authorizing Provider  acetaminophen (TYLENOL) 160 MG/5ML solution Place 31.3 mLs (1,000 mg total) into feeding tube 3 (three) times daily. Patient taking differently: Take 1,000 mg by mouth 3 (three) times daily. 08/06/20   Alcus Dad, MD  albuterol (PROVENTIL) (2.5 MG/3ML) 0.083% nebulizer solution Take 2.5 mg by nebulization every 6 (six) hours as needed for wheezing or shortness of breath.    [provider]  budesonide-formoterol (SYMBICORT) 160-4.5 MCG/ACT inhaler Inhale 1 puff into the lungs daily. Patient taking differently: Inhale 1 puff into the lungs in the morning and at bedtime. 12/21/19   [name], MD  docusate (COLACE) 50 MG/5ML liquid Place 10 mLs (100 mg total) into feeding tube daily. Patient taking differently: Place 100 mg into feeding tube daily as needed for mild constipation. 04/20/20   Donne Hazel, MD  feeding supplement, ENSURE ENLIVE, (ENSURE ENLIVE) LIQD Take 237 mLs by mouth 2 (two) times daily between meals. Patient not taking: No sig reported 04/19/20   Donne Hazel, MD  fluticasone [facility]) 50 MCG/ACT nasal spray Place 1 spray into both nostrils daily as needed for allergies or rhinitis.    [provider]  hydrOXYzine (ATARAX) 10 MG/5ML syrup Place 5 mLs (10 mg total) into feeding tube 3 (three) times daily  as needed for itching or anxiety. Patient taking differently: Take 10 mg by mouth 3 (three) times daily as needed for itching or anxiety. 08/06/20   Alcus Dad, MD  LORazepam (ATIVAN) 0.5 MG tablet Take 1 tablet (0.5 mg total) by mouth every 8 (eight) hours as needed for anxiety. 11/07/20   Carmin Muskrat, MD  mirtazapine (REMERON) 15 MG tablet Take 15 mg by mouth at bedtime. Crush and  put in pudding    [provider]  naproxen (NAPROSYN) 250 MG tablet Take 1 tablet (250 mg total) by mouth 2 (two) times daily as needed for moderate pain. Patient not taking: Reported on 11/07/2020 08/06/20   Alcus Dad, MD  Nutritional Supplements (FEEDING SUPPLEMENT, OSMOLITE 1.2 CAL,) LIQD Place 1,000 mLs into feeding tube continuous. Patient not taking: No sig reported  04/19/20   Donne Hazel, MD  Nutritional Supplements (KATE FARMS STANDARD 1.4) LIQD TAKE 50 ML PER HOUR FOR 20 HOURS BY J TUBE CONTINUOUS VIA PUMP SET PUMP TO PROVIDE WATER FLUSHES OF 20 ML EVERY OTHER HOUR DURING   CONTINOUS FEEDING. INCREASE TO 20 ML WATER EVERY HOUR IF SHOWING SIGNS OF  DEHYDRATION OR NOT DRINKING AS MUCH BY MOUTH. KEEP  ELEVATED  AT LEAST 45 DEGREES.  FOODS AND FLUIDS  AS DIRECTED BY PROVIDER. SET PUMP TO PROVIDE WATER FLUSHES OF 20 ML EVERY OTHER HOUR DURING    CONTINOUS FEEDING. INCREASE TO 20 ML WATER EVERY HOUR IF SHOWING SIGNS OF   DEHYDRATION OR NOT DRINKING AS MUCH BY MOUTH. KEEP   ELEVATED  AT LEAST 45 DEGREES.  FOODS AND FLUIDS  AS DIRECTED BY PROVIDER. 10/24/20   [provider]  ondansetron (ZOFRAN ODT) 8 MG disintegrating tablet Take 1 tablet (8 mg total) by mouth every 8 (eight) hours as needed for nausea or vomiting. 04/21/20   Lacretia Leigh, MD  pantoprazole sodium (PROTONIX) 40 mg/20 mL PACK Place 20 mLs (40 mg total) into feeding tube 2 (two) times daily. Patient taking differently: Take 40 mg by mouth 2 (two) times daily. Put in pudding 04/19/20   Donne Hazel, MD  predniSONE  (DELTASONE) 20 MG tablet Take 2 tablets (40 mg total) by mouth daily with breakfast. For the next four days 11/07/20   Carmin Muskrat, MD  pregabalin (LYRICA) 150 MG capsule Take 150 mg by mouth at bedtime. Put in pudding    [provider]  sucralfate (CARAFATE) 1 GM/10ML suspension Place 10 mLs (1 g total) into feeding tube 4 (four) times daily -  with meals and at bedtime. Patient taking differently: Take 1 g by mouth 4 (four) times daily -  with meals and at bedtime. 04/19/20   Donne Hazel, MD  Tiotropium Bromide Monohydrate 2.5 MCG/ACT AERS Inhale 2 puffs into the lungs daily.     [provider]  traMADol (ULTRAM) 50 MG tablet Take 50 mg by mouth every 12 (twelve) hours as needed for moderate pain (pain). Information from narcotic database  crush put in pudding    [provider]    Allergies    Azithromycin, Boniva [ibandronic acid], and Gabapentin  Review of Systems   Review of Systems  Constitutional: Positive for chills. Negative for fever.  Respiratory: Positive for shortness of breath. Negative for cough.   Cardiovascular: Negative for chest pain.  Gastrointestinal: Positive for abdominal pain, diarrhea, nausea and vomiting.  Genitourinary: Negative for dysuria.  Neurological: Positive for weakness (generalized) and headaches. Negative for dizziness, syncope and numbness.  All other systems reviewed and are negative.   Physical Exam Updated Vital Signs BP (!) 180/104 (BP Location: Right Arm)   Pulse (!) 108   Temp 98.8 F (37.1 C) (Oral)   Resp 15   SpO2 99%   Physical Exam Vitals and nursing note reviewed.  Constitutional:      General: She is not in acute distress.    Appearance: She is ill-appearing (chronically ill appearing).     Comments: Thin.   HENT:     Head: Normocephalic and atraumatic.  Eyes:     Extraocular Movements: Extraocular movements intact.     Pupils: Pupils are equal, round, and reactive to light.   Cardiovascular:     Rate and Rhythm: Regular rhythm. Tachycardia present.  Pulmonary:     Effort: Pulmonary effort is normal. No  respiratory distress.     Breath sounds: Normal breath sounds. No wheezing, rhonchi or rales.     Comments: Saturating well on baseline nasal cannula O2 Abdominal:     Palpations: Abdomen is soft.     Tenderness: There is generalized abdominal tenderness. There is no right CVA tenderness, left CVA tenderness or rebound.     Comments: G tube in place, no surrounding erythema/drainage/warmth.   Neurological:     Mental Status: She is alert.     Comments: Clear speech.  CN III through XII grossly intact.  Sensation gross intact bilateral upper and lower extremities.  5 out of 5 symmetric grip strength.  5 out of 5 strength with plantar dorsiflexion bilaterally.  Intact finger-to-nose.  Negative pronator drift.     ED Results / Procedures / Treatments   Labs (all labs ordered are listed, but only abnormal results are displayed) Labs Reviewed  CBC - Abnormal; Notable for the following components:      Result Value   RBC 5.49 (*)    Hemoglobin 15.3 (*)    HCT 50.7 (*)    All other components within normal limits  LIPASE, BLOOD  COMPREHENSIVE METABOLIC PANEL  URINALYSIS, ROUTINE W REFLEX MICROSCOPIC    EKG EKG Interpretation  Date/Time:  Thursday November 09 2020 23:07:20 EDT Ventricular Rate:  105 PR Interval:  162 QRS Duration: 62 QT Interval:  321 QTC Calculation: 425 R Axis:   90 Text Interpretation: Sinus tachycardia Right atrial enlargement Borderline right axis deviation Confirmed by Ripley Fraise [phone]) on 11/09/2020 11:11:18 PM   Radiology DG Chest 2 View  Result Date: 11/09/2020 CLINICAL DATA:  Shortness of breath and vomiting. Weakness. History of COPD. EXAM: CHEST - 2 VIEW COMPARISON:  Radiograph and CT 2 days ago. FINDINGS: Chronic emphysema and hyperinflation. No acute airspace disease. Normal heart size and mediastinal contours.  Chain sutures at the left hilum. No pulmonary edema, pleural effusion, or pneumothorax. Catheter projects over the upper abdomen, partially included IMPRESSION: Chronic emphysema and hyperinflation. No acute abnormality. No change from radiographs and CT 2 days ago. Electronically Signed   By: Keith Rake M.D.   On: 11/09/2020 21:38   CT Head Wo Contrast  Result Date: 11/10/2020 CLINICAL DATA:  Headaches. Classic migraine. Previous contrast material tonight. EXAM: CT HEAD WITHOUT CONTRAST TECHNIQUE: Contiguous axial images were obtained from the base of the skull through the vertex without intravenous contrast. COMPARISON:  06/15/2018 FINDINGS: Brain: Residual contrast material in the blood pool from prior CT scan may limits the ability to detect small focal areas of hemorrhage. Given this limitation, there is no evidence of acute intracranial hemorrhage. No mass effect or midline shift. No abnormal extra-axial fluid collections. Gray-white matter junctions are distinct. Basal cisterns are not effaced. Vascular: No hyperdense vessel or unexpected calcification. Skull: Calvarium appears intact. Sinuses/Orbits: Mucosal thickening and calcification in the sphenoid sinus consistent with chronic inflammatory process. No acute air-fluid levels in the paranasal sinuses. Mastoid air cells are clear. Other: None. IMPRESSION: 1. No acute intracranial abnormalities. 2. Chronic sphenoid sinus disease. 3. Residual contrast material in the blood pool from prior CT scan may limits the ability to detect small focal areas of hemorrhage. Given this limitation, there is no evidence of acute intracranial hemorrhage. Electronically Signed   By: Lucienne Capers M.D.   On: 11/10/2020 01:51   CT Abdomen Pelvis W Contrast  Result Date: 11/09/2020 CLINICAL DATA:  65 year old female with acute abdominal pain. EXAM: CT ABDOMEN AND PELVIS WITH  CONTRAST TECHNIQUE: Multidetector CT imaging of the abdomen and pelvis was performed  using the standard protocol following bolus administration of intravenous contrast. CONTRAST:  148mL OMNIPAQUE IOHEXOL 300 MG/ML  SOLN COMPARISON:  CT abdomen pelvis dated 08/05/2020. FINDINGS: Lower chest: The visualized lung bases are clear. No intra-abdominal free air or free fluid. Hepatobiliary: No focal liver abnormality is seen. No gallstones, gallbladder wall thickening, or biliary dilatation. Pancreas: Unremarkable. No pancreatic ductal dilatation or surrounding inflammatory changes. Spleen: Normal in size without focal abnormality. Adrenals/Urinary Tract: The adrenal glands unremarkable. The kidneys, visualized ureters, and urinary bladder appear unremarkable. Stomach/Bowel: Percutaneous jejunostomy with balloon in the body of the stomach and tip in the region of the duodenojejunal junction. There is no bowel obstruction or active inflammation. The appendix is normal. Vascular/Lymphatic: Advanced aortoiliac atherosclerotic disease. The IVC is unremarkable. No portal venous gas. There is no adenopathy. Reproductive: Hysterectomy. No adnexal masses. Other: Midline vertical anterior pelvic wall incisional scar. Musculoskeletal: Osteopenia with degenerative changes of the spine. No acute osseous pathology. IMPRESSION: No acute intra-abdominal or pelvic pathology. No bowel obstruction. Normal appendix. Electronically Signed   By: Anner Crete M.D.   On: 11/09/2020 23:30    Procedures Procedures   Medications Ordered in ED Medications  sodium chloride 0.9 % bolus 1,000 mL (0 mLs Intravenous Stopped 11/10/20 0124)  morphine 4 MG/ML injection 4 mg (4 mg Intravenous Given 11/09/20 2305)  ondansetron (ZOFRAN) injection 4 mg (4 mg Intravenous Given 11/09/20 2304)  iohexol (OMNIPAQUE) 300 MG/ML solution 100 mL (100 mLs Intravenous Contrast Given 11/09/20 2312)  alum & mag hydroxide-simeth (MAALOX/MYLANTA) 200-200-20 MG/5ML suspension 30 mL (30 mLs Oral Given 11/10/20 0048)    And  lidocaine (XYLOCAINE) 2  % viscous mouth solution 15 mL (15 mLs Oral Given 11/10/20 0049)  famotidine (PEPCID) IVPB 20 mg premix (0 mg Intravenous Stopped 11/10/20 0123)  metoCLOPramide (REGLAN) injection 5 mg (5 mg Intravenous Given 11/10/20 0122)  diphenhydrAMINE (BENADRYL) injection 25 mg (25 mg Intravenous Given 11/10/20 0122)    ED Course  I have reviewed the triage vital signs and the nursing notes.  Pertinent labs & imaging results that were available during my care of the patient were reviewed by me and considered in my medical decision making (see chart for details).    MDM Rules/Calculators/A&P                         Patient presents to the ED with complaints of continuing to feel poorly with N/V, abdominal pain, dyspnea, headache, & generalized weakness.  Chronically ill-appearing, thin, mildly tachycardic and hypertensive.  No focal neurologic deficits.  Abdomen with generalized tenderness to palpation.  Lungs clear.  Additional history obtained:  Additional history obtained from chart review & nursing note review.  Echo in 2021- EF 60-65% EKG: sinus tachycardia Right atrial enlargement Borderline right axis Lab Tests:  I Ordered, reviewed, and interpreted labs, which included:  CBC: Mild elevation in hemoglobin and hematocrit, no leukocytosis. CMP: Bicarb elevation consistent with prior.  Renal function preserved Urinalysis: No UTI. COVID/influenza: Negative  Imaging Studies ordered:  I ordered imaging studies which included CT head & CT A/P as well as CXR, I independently reviewed, formal radiology impression shows:  CXR: Chronic emphysema and hyperinflation. No acute abnormality. No change from radiographs and CT 2 days ago. CT A/P:No acute intra-abdominal or pelvic pathology. No bowel obstruction. Normal appendix. CT head wo: 1. No acute intracranial abnormalities. 2. Chronic sphenoid sinus disease. 3.  Residual contrast material in the blood pool from prior CT scan may limits the ability to detect  small focal areas of hemorrhage. Given this limitation, there is no evidence of acute intracranial hemorrhage.  ED Course:  Patient's work-up has been overall reassuring in the emergency department.  Labs are without significant leukocytosis, acute electrolyte derangement, AKI, or liver dysfunction.  She is negative for COVID and influenza.  Her chest x-ray does not show any focal new infiltrate to suggest community-acquired pneumonia.  Her CT abdomen/pelvis does not show any acute abdominal process or surgical process.  CT head without contrast without acute process.  She has no focal neurologic deficits.  She has no nuchal rigidity to raise concern for meningitis.  She has received medications for migraine as well as abdominal pain with some improvement in her symptoms.  She is tolerating p.o.  Overall appears appropriate for discharge home with continued supportive care at this time I discussed results, treatment plan, need for follow-up, and return precautions with the patient. Provided opportunity for questions, patient confirmed understanding and is in agreement with plan.   This is a shared visit with supervising physician Dr. Christy Gentles who has independently evaluated patient & provided guidance in evaluation/management/disposition, in agreement with care   Blood pressure (!) 155/98, pulse 100, temperature 98.9 F (37.2 C), temperature source Oral, resp. rate 19, SpO2 100 %.  Portions of this note were generated with [employer]. Dictation errors may occur despite best attempts at proofreading.  Final Clinical Impression(s) / ED Diagnoses Final diagnoses:  Generalized abdominal pain  Non-intractable vomiting with nausea, unspecified vomiting type  Acute nonintractable headache, unspecified headache type    Rx / DC Orders ED Discharge Orders         Ordered    ondansetron (ZOFRAN ODT) 4 MG disintegrating tablet  Every 8 hours PRN        11/10/20 0350    famotidine (PEPCID)  20 MG tablet  2 times daily PRN        11/10/20 0350           Donzell Coller, Glynda Jaeger, PA-C 11/10/20 0515    Ripley Fraise, MD 11/10/20 [phone]

## 2020-11-09 NOTE — ED Triage Notes (Signed)
Pt reports SHOB, headache, weakness, and vomiting x1 week. Pt was seen here yesterday for same. Pt reports feeling better when she left, but started feeling worse early this morning. Pt normally on 2L O2.

## 2020-11-10 ENCOUNTER — Emergency Department (HOSPITAL_COMMUNITY): Payer: No Typology Code available for payment source

## 2020-11-10 LAB — URINALYSIS, ROUTINE W REFLEX MICROSCOPIC
Bilirubin Urine: NEGATIVE
Glucose, UA: NEGATIVE mg/dL
Hgb urine dipstick: NEGATIVE
Ketones, ur: NEGATIVE mg/dL
Nitrite: NEGATIVE
Protein, ur: 30 mg/dL — AB
Specific Gravity, Urine: 1.025 (ref 1.005–1.030)
pH: 7 (ref 5.0–8.0)

## 2020-11-10 LAB — RESP PANEL BY RT-PCR (FLU A&B, COVID) ARPGX2
Influenza A by PCR: NEGATIVE
Influenza B by PCR: NEGATIVE
SARS Coronavirus 2 by RT PCR: NEGATIVE

## 2020-11-10 MED ORDER — ONDANSETRON 4 MG PO TBDP: 4.0000 mg | ORAL_TABLET | Freq: Three times a day (TID) | ORAL | 0 refills | Status: AC | PRN

## 2020-11-10 MED ORDER — METOCLOPRAMIDE HCL 5 MG/ML IJ SOLN
5.0000 mg | Freq: Once | INTRAMUSCULAR | Status: AC
  Administered 2020-11-10: 5 mg via INTRAVENOUS
  Filled 2020-11-10: qty 2

## 2020-11-10 MED ORDER — FAMOTIDINE IN NACL 20-0.9 MG/50ML-% IV SOLN
20.0000 mg | Freq: Once | INTRAVENOUS | Status: AC
  Administered 2020-11-10: 20 mg via INTRAVENOUS
  Filled 2020-11-10: qty 50

## 2020-11-10 MED ORDER — DIPHENHYDRAMINE HCL 50 MG/ML IJ SOLN
25.0000 mg | Freq: Once | INTRAMUSCULAR | Status: AC
  Administered 2020-11-10: 25 mg via INTRAVENOUS
  Filled 2020-11-10: qty 1

## 2020-11-10 MED ORDER — FAMOTIDINE 20 MG PO TABS: 20.0000 mg | ORAL_TABLET | Freq: Two times a day (BID) | ORAL | 0 refills | Status: AC | PRN

## 2020-11-10 MED ORDER — ALUM & MAG HYDROXIDE-SIMETH 200-200-20 MG/5ML PO SUSP
30.0000 mL | Freq: Once | ORAL | Status: AC
  Administered 2020-11-10: 30 mL via ORAL
  Filled 2020-11-10: qty 30

## 2020-11-10 MED ORDER — LIDOCAINE VISCOUS HCL 2 % MT SOLN
15.0000 mL | Freq: Once | OROMUCOSAL | Status: AC
  Administered 2020-11-10: 15 mL via ORAL
  Filled 2020-11-10: qty 15

## 2020-11-10 NOTE — Discharge Instructions (Addendum)
You were seen in the emergency department today for weakness, vomiting, headache, and abdominal pain.  Your labs are overall reassuring.  Your CT scan did not show any significant abnormalities.  We are sending you home with Pepcid to take twice per day as needed for pain as well as Zofran to take every hours as needed for nausea and vomiting.  We have prescribed you new medication(s) today. Discuss the medications prescribed today with your pharmacist as they can have adverse effects and interactions with your other medicines including over the counter and prescribed medications. Seek medical evaluation if you start to experience new or abnormal symptoms after taking one of these medicines, seek care immediately if you start to experience difficulty breathing, feeling of your throat closing, facial swelling, or rash as these could be indications of a more serious allergic reaction  The Ativan that was prescribed at your last visit to help with anxiety can also help with nausea.  Please follow with your primary care very closely, call tomorrow for soonest possible follow-up appointment.  Return to the ER for any new or worsening symptoms including but not limited to increased or new pain, fever, inability to keep fluids down, blood in vomit or stool, passing out, chest pain, or any other concerns

## 2021-02-19 ENCOUNTER — Other Ambulatory Visit (HOSPITAL_COMMUNITY): Payer: Self-pay | Admitting: General Practice

## 2021-02-19 DIAGNOSIS — E440 Moderate protein-calorie malnutrition: Secondary | ICD-10-CM

## 2021-02-27 ENCOUNTER — Ambulatory Visit (HOSPITAL_COMMUNITY)
Admission: RE | Admit: 2021-02-27 | Discharge: 2021-02-27 | Disposition: A | Discharge status: Home / Self Care | Payer: No Typology Code available for payment source | Source: Ambulatory Visit | Attending: Radiology | Admitting: Radiology

## 2021-02-27 ENCOUNTER — Other Ambulatory Visit (HOSPITAL_COMMUNITY): Payer: Self-pay | Admitting: Diagnostic Radiology

## 2021-02-27 ENCOUNTER — Other Ambulatory Visit: Payer: Self-pay

## 2021-02-27 DIAGNOSIS — K9420 Gastrostomy complication, unspecified: Secondary | ICD-10-CM | POA: Insufficient documentation

## 2021-02-27 DIAGNOSIS — E440 Moderate protein-calorie malnutrition: Secondary | ICD-10-CM | POA: Diagnosis not present

## 2021-02-27 HISTORY — PX: IR GJ TUBE CHANGE: IMG1440

## 2021-02-27 MED ORDER — LIDOCAINE VISCOUS HCL 2 % MT SOLN
OROMUCOSAL | Status: AC
  Filled 2021-02-27: qty 15

## 2021-02-27 MED ORDER — IOHEXOL 300 MG/ML  SOLN
40.0000 mL | Freq: Once | INTRAMUSCULAR | Status: AC | PRN
  Administered 2021-02-27: 20 mL

## 2021-02-27 MED ORDER — LIDOCAINE VISCOUS HCL 2 % MT SOLN
OROMUCOSAL | Status: DC | PRN
  Administered 2021-02-27: 15 mL via OROMUCOSAL

## 2021-02-27 NOTE — Procedures (Signed)
Interventional Radiology Procedure:   Indications: Broken cap on GJ tube  Procedure: GJ tube exchange  Findings: 24 Fr GJ tube with tip in jejunum  Complications: No immediate complications noted.     EBL:  None  Plan: GJ tube is ready to use.    Chameka Mcmullen R. Anselm Pancoast, MD  Pager: [phone]

## 2021-03-15 ENCOUNTER — Inpatient Hospital Stay
Admission: RE | Admit: 2021-03-15 | Discharge: 2021-03-15 | Disposition: A | Discharge status: Home / Self Care | Payer: Self-pay | Source: Ambulatory Visit | Attending: Diagnostic Radiology | Admitting: Diagnostic Radiology

## 2021-03-15 ENCOUNTER — Other Ambulatory Visit (HOSPITAL_COMMUNITY): Payer: Self-pay | Admitting: Diagnostic Radiology

## 2021-03-15 DIAGNOSIS — C801 Malignant (primary) neoplasm, unspecified: Secondary | ICD-10-CM

## 2021-06-08 ENCOUNTER — Ambulatory Visit (HOSPITAL_COMMUNITY)
Admission: RE | Admit: 2021-06-08 | Discharge: 2021-06-08 | Disposition: A | Discharge status: Home / Self Care | Payer: No Typology Code available for payment source | Source: Ambulatory Visit | Attending: Diagnostic Radiology | Admitting: Diagnostic Radiology

## 2021-06-08 ENCOUNTER — Other Ambulatory Visit (HOSPITAL_COMMUNITY): Payer: Self-pay | Admitting: Diagnostic Radiology

## 2021-06-08 ENCOUNTER — Other Ambulatory Visit: Payer: Self-pay

## 2021-06-08 DIAGNOSIS — R633 Feeding difficulties, unspecified: Secondary | ICD-10-CM | POA: Insufficient documentation

## 2021-06-08 DIAGNOSIS — Z431 Encounter for attention to gastrostomy: Secondary | ICD-10-CM | POA: Insufficient documentation

## 2021-06-08 HISTORY — PX: IR GJ TUBE CHANGE: IMG1440

## 2021-06-08 MED ORDER — LIDOCAINE VISCOUS HCL 2 % MT SOLN
OROMUCOSAL | Status: DC | PRN
  Administered 2021-06-08: 15 mL via OROMUCOSAL

## 2021-06-08 MED ORDER — IOHEXOL 300 MG/ML  SOLN
50.0000 mL | Freq: Once | INTRAMUSCULAR | Status: AC | PRN
  Administered 2021-06-08: 10 mL

## 2021-06-08 MED ORDER — LIDOCAINE VISCOUS HCL 2 % MT SOLN
OROMUCOSAL | Status: AC
  Filled 2021-06-08: qty 15

## 2021-08-03 ENCOUNTER — Other Ambulatory Visit: Payer: Self-pay

## 2021-08-03 ENCOUNTER — Emergency Department (HOSPITAL_BASED_OUTPATIENT_CLINIC_OR_DEPARTMENT_OTHER)
Admission: EM | Admit: 2021-08-03 | Discharge: 2021-08-04 | Disposition: A | Discharge status: Home / Self Care | Payer: No Typology Code available for payment source | Attending: Emergency Medicine | Admitting: Emergency Medicine

## 2021-08-03 ENCOUNTER — Encounter (HOSPITAL_BASED_OUTPATIENT_CLINIC_OR_DEPARTMENT_OTHER): Payer: Self-pay

## 2021-08-03 ENCOUNTER — Emergency Department (HOSPITAL_BASED_OUTPATIENT_CLINIC_OR_DEPARTMENT_OTHER): Payer: No Typology Code available for payment source | Admitting: Radiology

## 2021-08-03 DIAGNOSIS — M25552 Pain in left hip: Secondary | ICD-10-CM | POA: Insufficient documentation

## 2021-08-03 DIAGNOSIS — M25559 Pain in unspecified hip: Secondary | ICD-10-CM

## 2021-08-03 MED ORDER — HYDROCODONE-ACETAMINOPHEN 7.5-325 MG/15ML PO SOLN
15.0000 mL | Freq: Once | ORAL | Status: AC
  Administered 2021-08-03: 15 mL via ORAL
  Filled 2021-08-03: qty 15

## 2021-08-03 NOTE — ED Triage Notes (Signed)
Pt c/o L hip pain after trying to get off the commode yesterday. Pt states as she was standing up she felt a pop and pain then she sat back down. Now pt states she is unable to bear weight on the L side.

## 2021-08-03 NOTE — ED Provider Notes (Signed)
[facility] EMERGENC[facility] DEPT Provider Note   CSN: [id number] Arrival date & time: 08/03/21  1734     History  Chief Complaint  Patient presents with   Hip Pain    Claudia Wiley is a 66 y.o. female.  66 year old female with multiple past medical problems as documented below who presents emerged from today with left hip pain.  Patient was in her normal state of health.  She would stand up from the toilet and she heard and felt a pop in her left hip.  She sat back down and when she went to stand back up she had too much pain to do so.  She states she has anterior and lateral hip pain.  Worse with palpation.  No falls recently or car accidents.  She never had had this happen before.  Normally she gets around with a walker or even sometimes without it.  She does not with her daughter.  She has access to a wheelchair as well.  She is had physical therapy occupational therapy and nursing in the past but not currently.  She tried taking tramadol prior to coming in which did not help   Hip Pain      Home Medications Prior to Admission medications   Medication Sig Start Date End Date Taking? Authorizing Provider  HYDROcodone-acetaminophen (HYCET) 7.5-325 mg/15 ml solution Take 7.5 mL by mouth three times a day as needed for SEVERE pain. 08/04/21  Yes Karma Ansley, Corene Cornea, MD  acetaminophen (TYLENOL) 160 MG/5ML solution Place 31.3 mLs (1,000 mg total) into feeding tube 3 (three) times daily. Patient taking differently: Take 1,000 mg by mouth 3 (three) times daily as needed for mild pain. 08/06/20   Alcus Dad, MD  albuterol (PROVENTIL) (2.5 MG/3ML) 0.083% nebulizer solution Take 2.5 mg by nebulization every 6 (six) hours as needed for wheezing or shortness of breath.    [provider]  budesonide-formoterol (SYMBICORT) 160-4.5 MCG/ACT inhaler Inhale 1 puff into the lungs daily. Patient taking differently: Inhale 1 puff into the lungs in the morning and at bedtime. 12/21/19    [name], MD  docusate (COLACE) 50 MG/5ML liquid Place 10 mLs (100 mg total) into feeding tube daily. Patient taking differently: Place 100 mg into feeding tube daily as needed for mild constipation. 04/20/20   Donne Hazel, MD  famotidine (PEPCID) 20 MG tablet Take 1 tablet (20 mg total) by mouth 2 (two) times daily as needed for heartburn or indigestion (abdominal pain). 11/10/20   Petrucelli, Samantha R, PA-C  feeding supplement, ENSURE ENLIVE, (ENSURE ENLIVE) LIQD Take 237 mLs by mouth 2 (two) times daily between meals. 04/19/20   Donne Hazel, MD  fluticasone (FLONASE) 50 MCG/ACT nasal spray Place 1 spray into both nostrils daily as needed for allergies or rhinitis.    [provider]  hydrOXYzine (ATARAX) 10 MG/5ML syrup Place 5 mLs (10 mg total) into feeding tube 3 (three) times daily as needed for itching or anxiety. Patient taking differently: Take 10 mg by mouth 3 (three) times daily as needed for itching or anxiety. 08/06/20   Alcus Dad, MD  LORazepam (ATIVAN) 0.5 MG tablet Take 1 tablet (0.5 mg total) by mouth every 8 (eight) hours as needed for anxiety. 11/07/20   Carmin Muskrat, MD  mirtazapine (REMERON) 15 MG tablet Take 15 mg by mouth at bedtime. Crush and  put in pudding    [provider]  Nutritional Supplements (FEEDING SUPPLEMENT, OSMOLITE 1.2 CAL,) LIQD Place 1,000 mLs into  feeding tube continuous. 04/19/20   Donne Hazel, MD  Nutritional Supplements (KATE FARMS STANDARD 1.4) LIQD TAKE 50 ML PER HOUR FOR 20 HOURS BY J TUBE CONTINUOUS VIA PUMP SET PUMP TO PROVIDE WATER FLUSHES OF 20 ML EVERY OTHER HOUR DURING   CONTINOUS FEEDING. INCREASE TO 20 ML WATER EVERY HOUR IF SHOWING SIGNS OF  DEHYDRATION OR NOT DRINKING AS MUCH BY MOUTH. KEEP  ELEVATED  AT LEAST 45 DEGREES.  FOODS AND FLUIDS  AS DIRECTED BY PROVIDER. SET PUMP TO PROVIDE WATER FLUSHES OF 20 ML EVERY OTHER HOUR DURING    CONTINOUS FEEDING. INCREASE TO 20 ML WATER EVERY HOUR IF SHOWING  SIGNS OF   DEHYDRATION OR NOT DRINKING AS MUCH BY MOUTH. KEEP   ELEVATED  AT LEAST 45 DEGREES.  FOODS AND FLUIDS  AS DIRECTED BY PROVIDER. 10/24/20   [provider]  ondansetron (ZOFRAN ODT) 4 MG disintegrating tablet Take 1 tablet (4 mg total) by mouth every 8 (eight) hours as needed for nausea or vomiting. 11/10/20   Petrucelli, Samantha R, PA-C  pantoprazole sodium (PROTONIX) 40 mg/20 mL PACK Place 20 mLs (40 mg total) into feeding tube 2 (two) times daily. Patient taking differently: Take 40 mg by mouth 2 (two) times daily. Put in pudding 04/19/20   Donne Hazel, MD  predniSONE (DELTASONE) 20 MG tablet Take 2 tablets (40 mg total) by mouth daily with breakfast. For the next four days 11/07/20   Carmin Muskrat, MD  pregabalin (LYRICA) 150 MG capsule Take 150 mg by mouth at bedtime. Put in pudding    [provider]  sucralfate (CARAFATE) 1 GM/10ML suspension Place 10 mLs (1 g total) into feeding tube 4 (four) times daily -  with meals and at bedtime. Patient taking differently: Take 1 g by mouth 4 (four) times daily -  with meals and at bedtime. 04/19/20   Donne Hazel, MD  Tiotropium Bromide Monohydrate 2.5 MCG/ACT AERS Inhale 2 puffs into the lungs daily.     [provider]  traMADol (ULTRAM) 50 MG tablet Take 50 mg by mouth every 12 (twelve) hours as needed for moderate pain (pain). Information from narcotic database  crush put in pudding    [provider]      Allergies    Azithromycin, Boniva [ibandronic acid], and Gabapentin    Review of Systems   Review of Systems  Physical Exam Updated Vital Signs BP 111/77 (BP Location: Right Arm)    Pulse 95    Temp 99.1 F (37.3 C) (Oral)    Resp 16    Ht 4\' 11"  (1.499 m)    Wt 46.7 kg    SpO2 100%    BMI 20.80 kg/m  Physical Exam Vitals and nursing note reviewed.  Constitutional:      Appearance: She is well-developed.  HENT:     Head: Normocephalic and atraumatic.     Nose: Nose normal. No  congestion or rhinorrhea.     Mouth/Throat:     Mouth: Mucous membranes are moist.  Cardiovascular:     Rate and Rhythm: Normal rate and regular rhythm.  Pulmonary:     Effort: No respiratory distress.     Breath sounds: No stridor.  Abdominal:     General: Abdomen is flat. There is no distension.  Musculoskeletal:        General: Tenderness (TTP to anterior and lateral left hip, worse with range of motion and worse with pushing on her heel.  No  rashes.  No evidence of trauma.) present. No swelling. Normal range of motion.     Cervical back: Normal range of motion.  Skin:    General: Skin is warm and dry.  Neurological:     General: No focal deficit present.     Mental Status: She is alert.    ED Results / Procedures / Treatments   Labs (all labs ordered are listed, but only abnormal results are displayed) Labs Reviewed - No data to display  EKG None  Radiology DG Hip Unilat With Pelvis 2-3 Views Left  Result Date: 08/03/2021 CLINICAL DATA:  Left hip pain EXAM: DG HIP (WITH OR WITHOUT PELVIS) 2-3V LEFT COMPARISON:  None. FINDINGS: There is no evidence of hip fracture or dislocation. There is no evidence of arthropathy or other focal bone abnormality. Feeding jejunostomy noted. IMPRESSION: Negative. Electronically Signed   By: Franchot Gallo M.D.   On: 08/03/2021 18:41    Procedures Procedures    Medications Ordered in ED Medications  HYDROcodone-acetaminophen (HYCET) 7.5-325 mg/15 ml solution 15 mL (15 mLs Oral Given 08/03/21 2321)    ED Course/ Medical Decision Making/ A&P Clinical Course as of 08/04/21 0448  Sat Aug 04, 2021  0026 Temp: 99.1 F (37.3 C) Afebrile.  Doubt septic joint. [JM]  0027 O2 Flow Rate (L/min): 2 L/min Baseline. [JM]  0027 Pulse Rate(!): 105 Likely related to her pain. [JM]  0027 DG Hip Unilat With Pelvis 2-3 Views Left Reviewed and interpreted on self without any obvious fractures.  Does seem to have evidence of osteopenia and osteoporosis.   Interpreted by radiology as negative [JM]  0027 Suspect [JM]  7628  muscular or ligamentous injury.  We will treat with general pain medications and see if that improves it.  Otherwise she will probably need physical therapy at home. [JM]    Clinical Course User Index [JM] Lalla Laham, Corene Cornea, MD                           Medical Decision Making Amount and/or Complexity of Data Reviewed Radiology: ordered. Decision-making details documented in ED Course.  Risk Prescription drug management.   Significant improvement with medications. Able to ambulate better. Will consult TOC hopefully for PT at home. D/c w/ duaghter in stable condition.    Final Clinical Impression(s) / ED Diagnoses Final diagnoses:  Hip pain    Rx / DC Orders ED Discharge Orders          [place]        08/04/21 [id number]    Face-to-face encounter (required for Medicare/Medicaid patients)       Comments: I Merrily Pew certify that this patient is under my care and that I, or a nurse practitioner or physician's assistant working with me, had a face-to-face encounter that meets the physician face-to-face encounter requirements with this patient on 08/04/2021. The encounter with the patient was in whole, or in part for the following medical condition(s) which is the primary reason for home health care (List medical condition): pain in left hip. Difficult to walk on own.. further orders per pcp please.   08/04/21 0053    HYDROcodone-acetaminophen (HYCET) 7.5-325 mg/15 ml solution        08/04/21 0055              Nyela Cortinas, Corene Cornea, MD 08/04/21 7616

## 2021-08-04 MED ORDER — HYDROCODONE-ACETAMINOPHEN 7.5-325 MG/15ML PO SOLN: ORAL | 0 refills | Status: DC

## 2021-08-04 NOTE — TOC CM/SW Note (Signed)
Called and discussed [place] with daughter. The patient has [employer] and is seen at the [facility]. Called Angie from [facility] to assist patient for [facility] PT

## 2021-08-04 NOTE — Discharge Instructions (Addendum)
Do NOT take the medicine I am giving you with the TRAMADOL

## 2021-08-16 ENCOUNTER — Ambulatory Visit (HOSPITAL_COMMUNITY): Payer: No Typology Code available for payment source

## 2021-08-16 ENCOUNTER — Other Ambulatory Visit (HOSPITAL_COMMUNITY): Payer: No Typology Code available for payment source

## 2021-09-13 ENCOUNTER — Other Ambulatory Visit (HOSPITAL_COMMUNITY): Payer: No Typology Code available for payment source

## 2021-09-22 IMAGING — DX DG CHEST 1V PORT
1 series · 1 of 1 positions shown · non-contrast
Comparison: June 29, 2019

CLINICAL DATA: Shortness of breath. Reported history of lung
carcinoma

EXAM:
PORTABLE CHEST 1 VIEW

[chest ap]
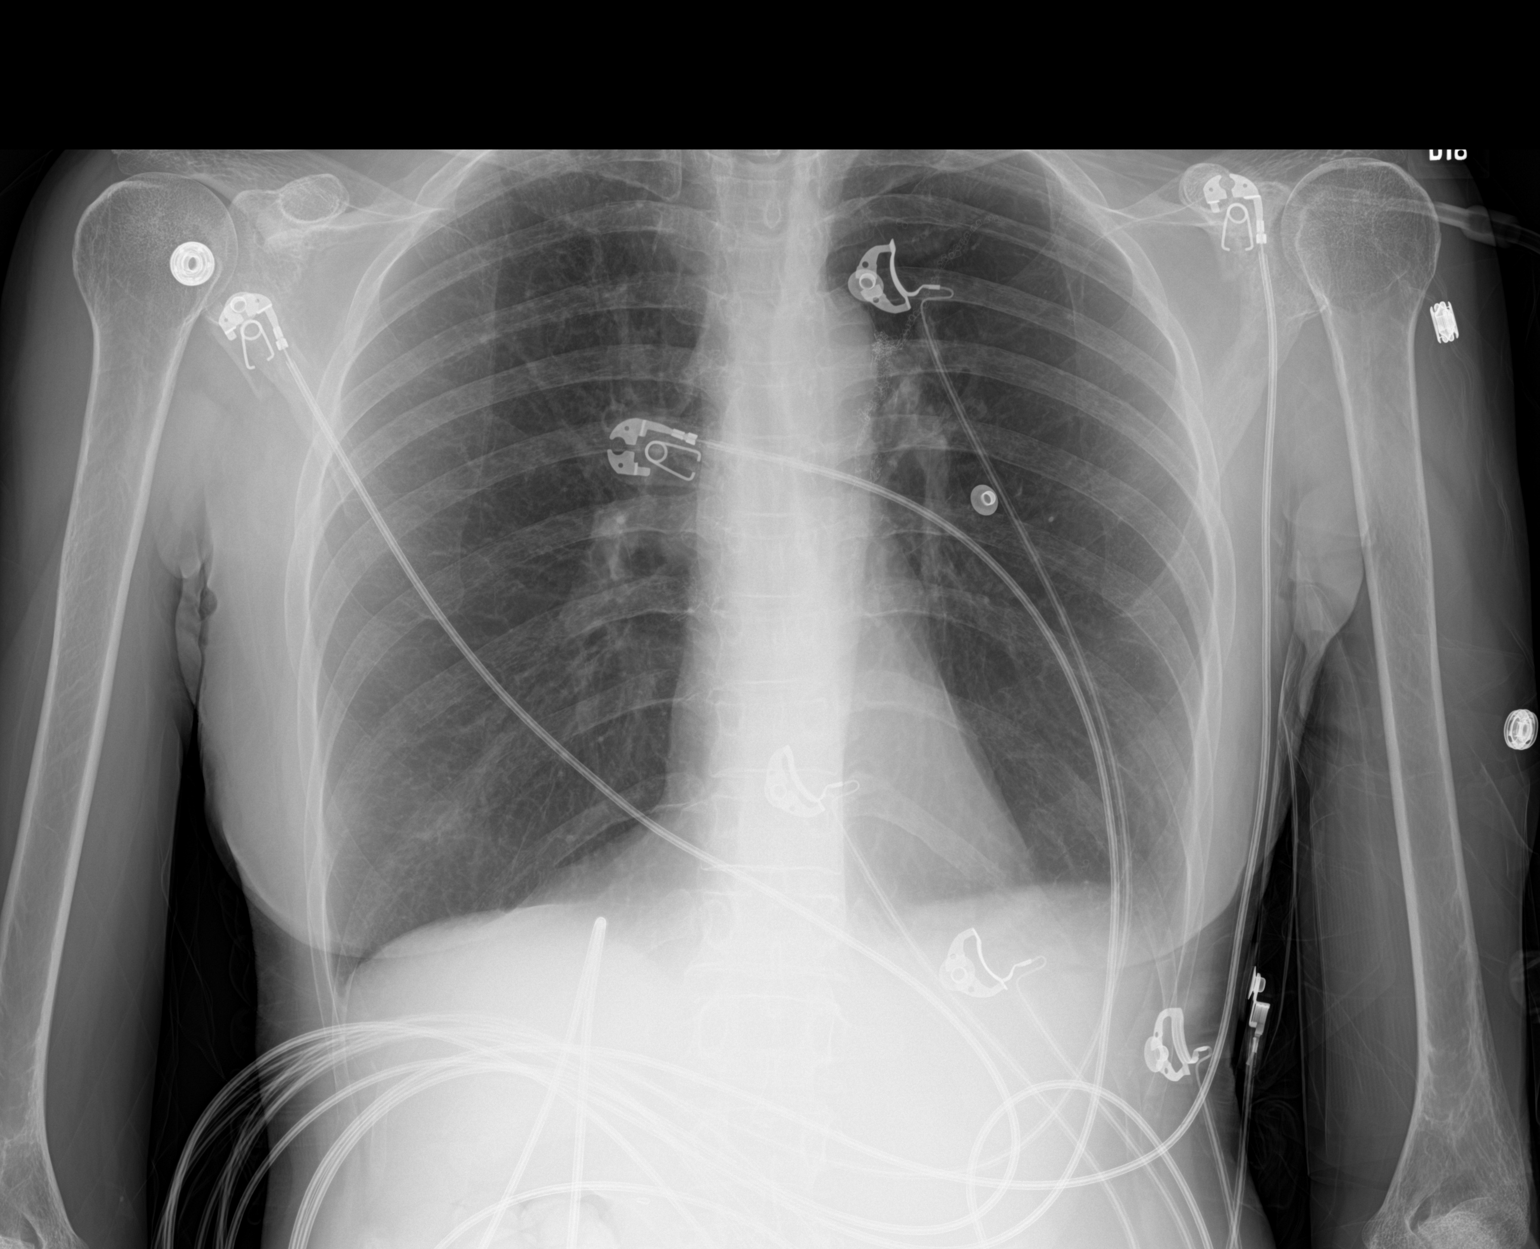

[1 of 1 positions shown; findings below may reference images not displayed]

FINDINGS: Postoperative change noted in the left perihilar region. Lungs are
somewhat hyperexpanded, stable. No edema or airspace opacity. No
adenopathy or mass. Heart size normal. No bone lesions.
IMPRESSION: Lungs somewhat hyperexpanded, stable. Postoperative change on the
left with clips in the left perihilar region. No edema or airspace
opacity. No mass or adenopathy. Heart size normal.

## 2021-09-23 IMAGING — DX DG CHEST 2V
2 series · 2 of 2 positions shown · non-contrast
Comparison: 09/09/2019

CLINICAL DATA: Shortness of breath

EXAM:
CHEST - 2 VIEW

[chest pa]
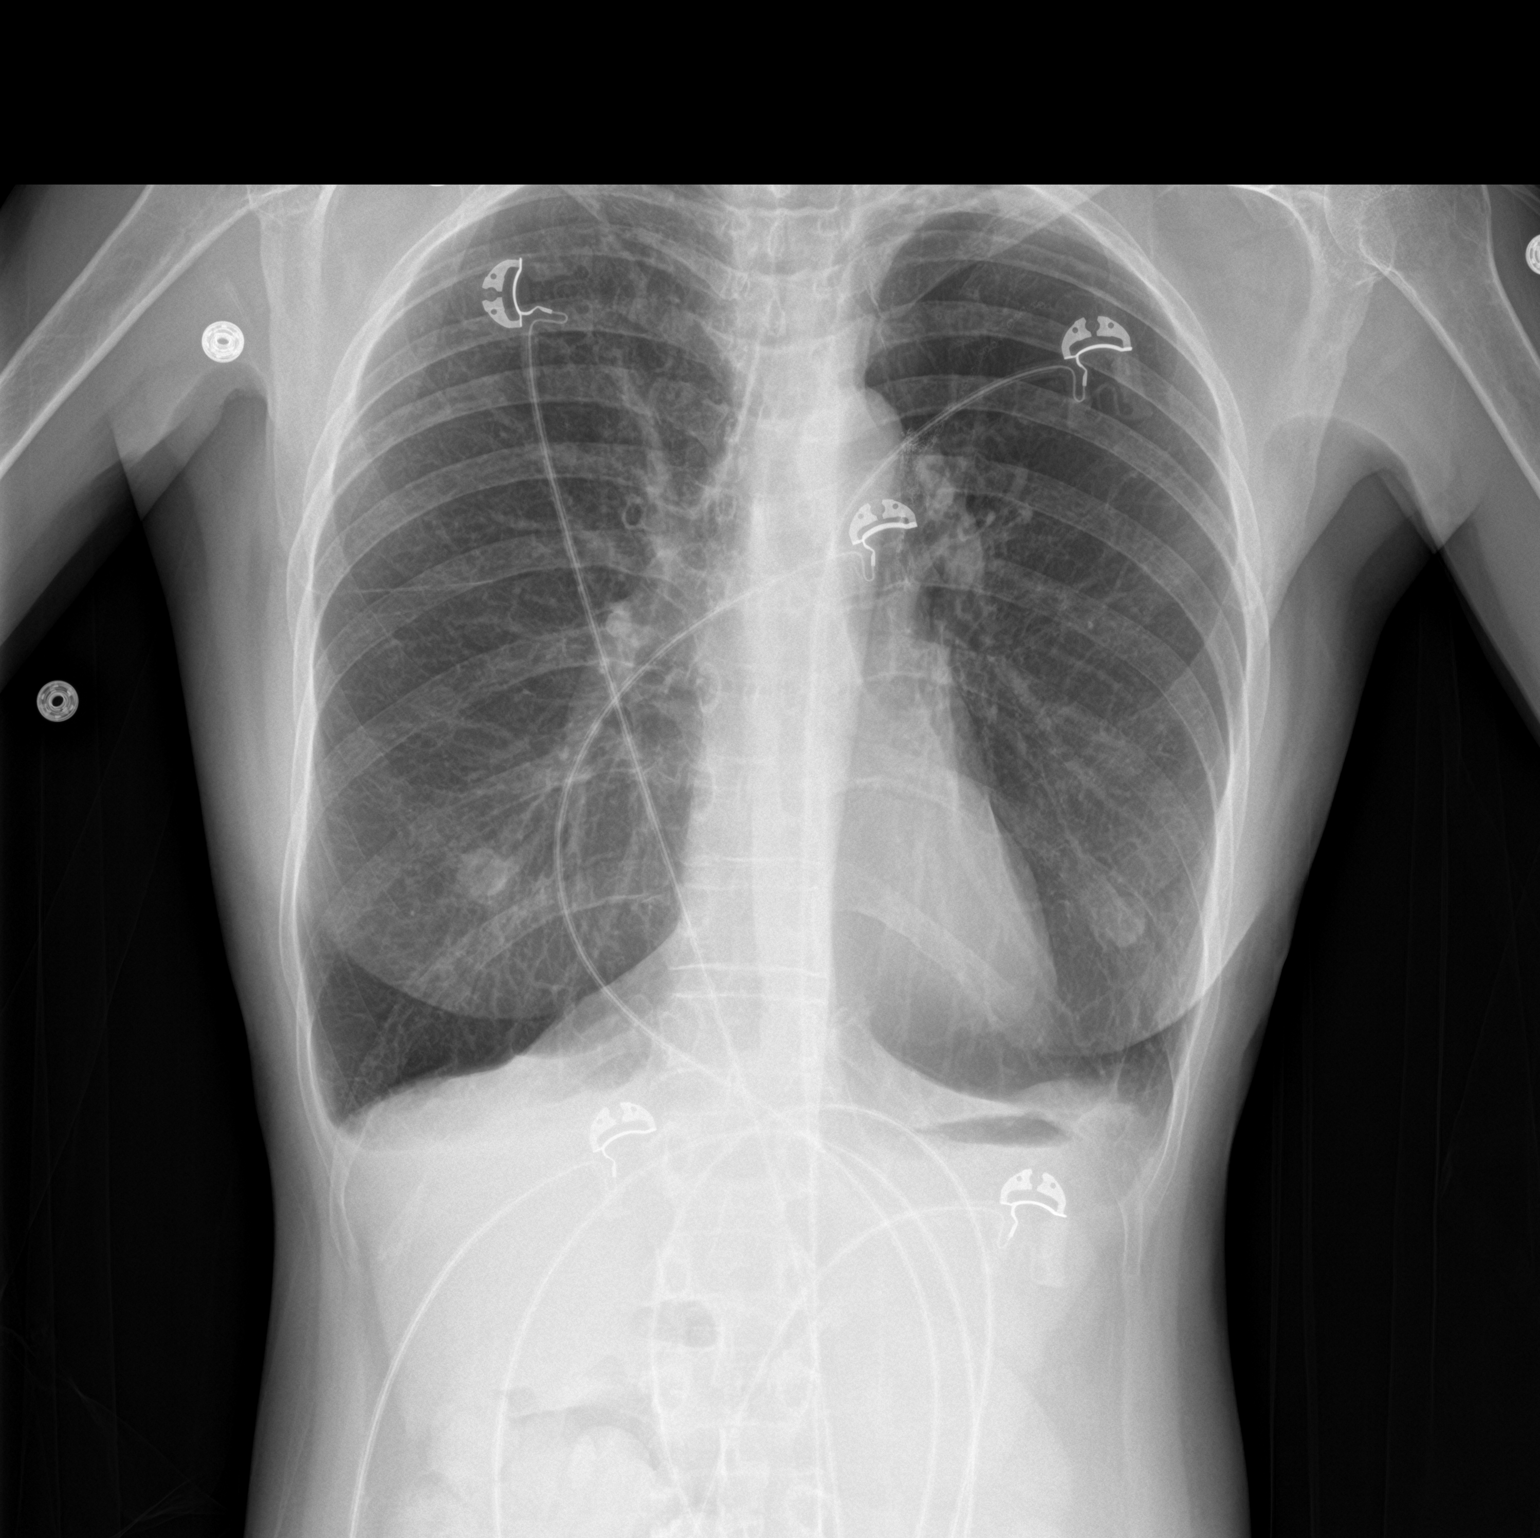

[chest lat]
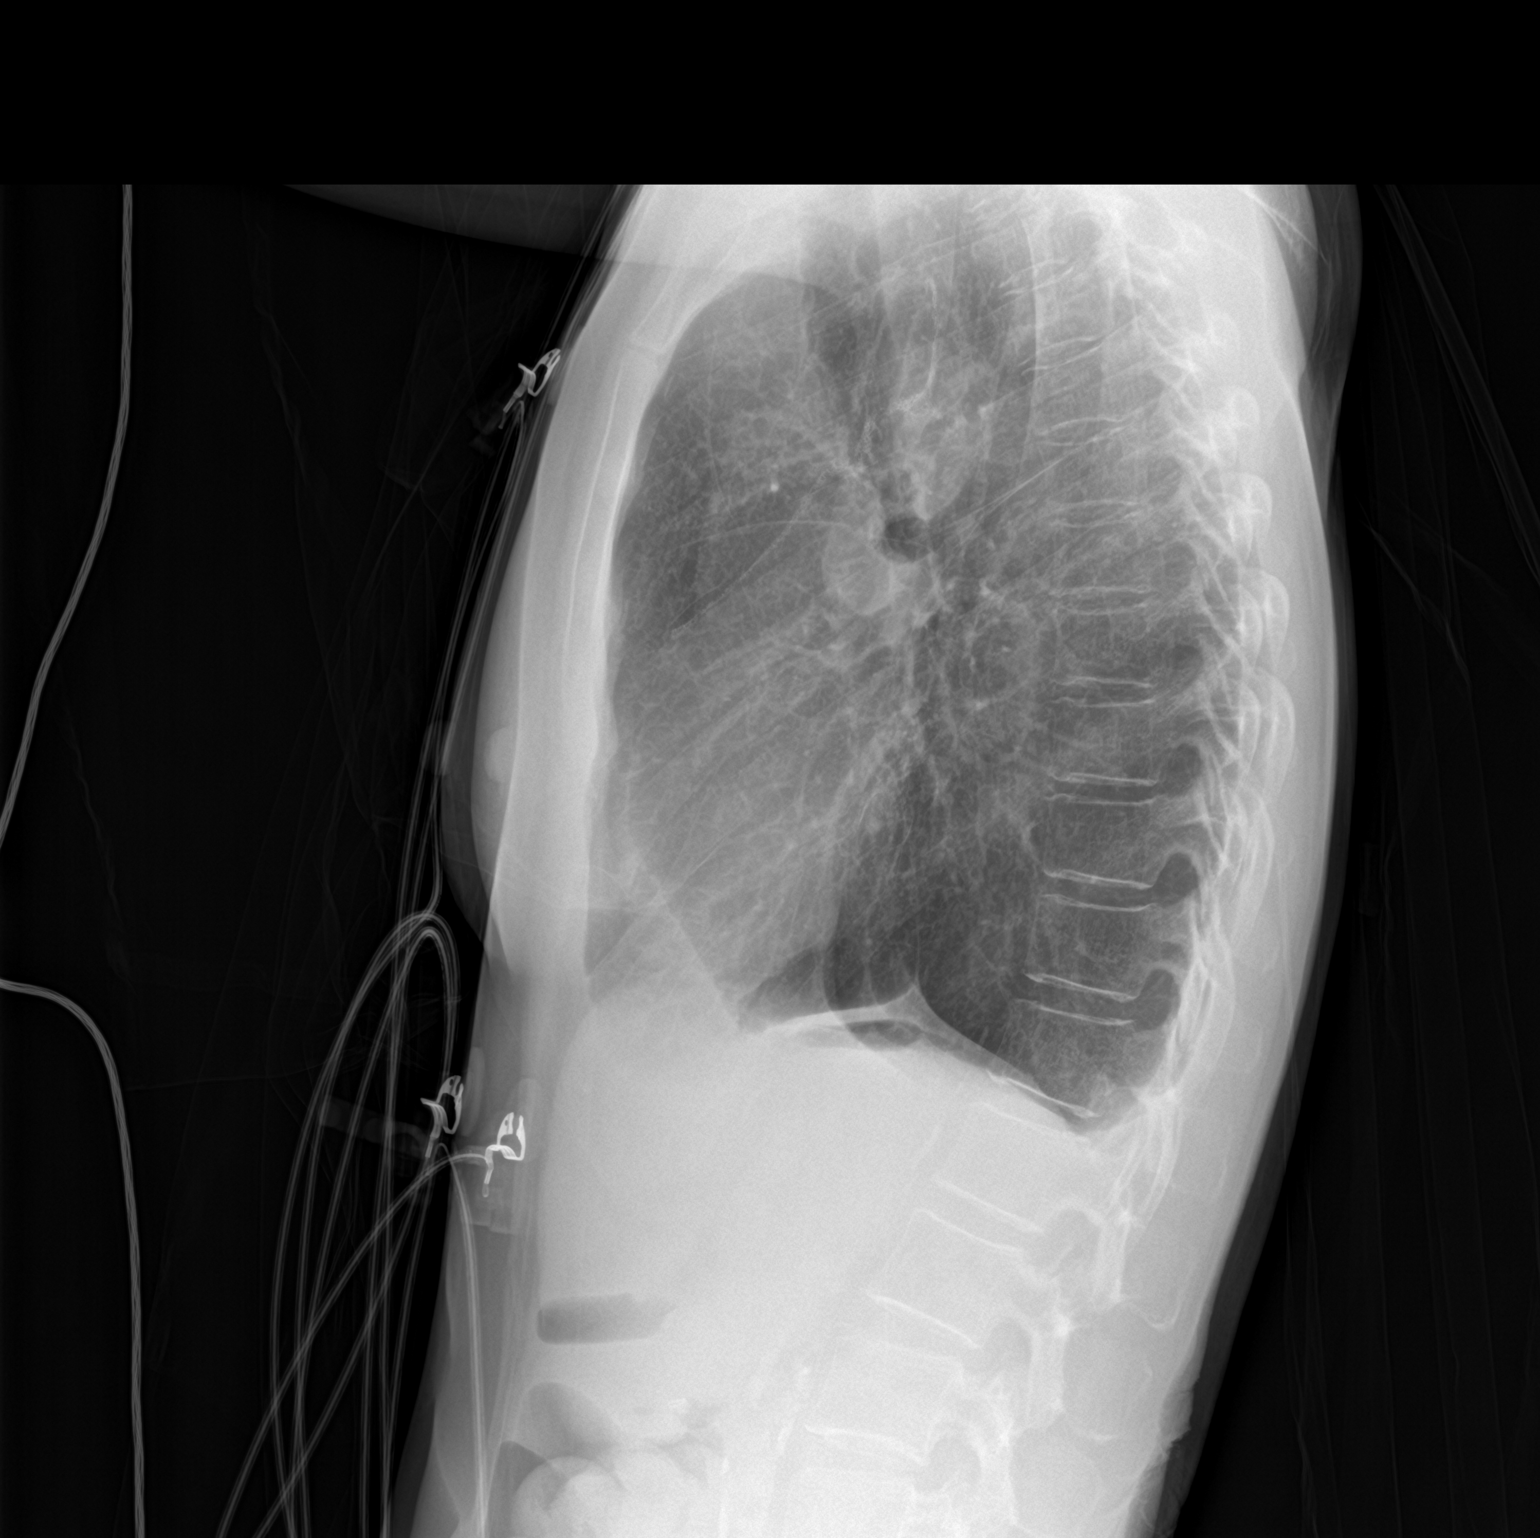

[2 of 2 positions shown; findings below may reference images not displayed]

FINDINGS: The heart size and mediastinal contours are within normal limits.
Rounded nodular densities projecting over the bilateral lung fields
suggestive of nipple shadows. Lungs are mildly hyperexpanded. Mild
left basilar scarring, unchanged. Both lungs are clear. The
visualized skeletal structures are unremarkable. Abdominal aortic
atherosclerotic calcifications noted.
IMPRESSION: No acute cardiopulmonary disease. Mild hyperexpansion of the lungs.
Abdominal aortic atherosclerosis.

## 2022-05-02 IMAGING — XA IR CONVERT G-TUBE TO G-JTUBE
2 series · 8 of 8 positions shown · non-contrast
Comparison: none

INDICATION: 64-year-old female with a history of multiple esophageal strictures
requiring percutaneous enteric feeding. A percutaneous gastrostomy
tube was placed on 04/13/2020. She presents today for conversion to
a gastro jejunostomy tube to provide access for direct enteral
nutrition.

[Series 2: fl - angio · 4 of 32 frames shown (1 of 2)]
[frame 5/32]
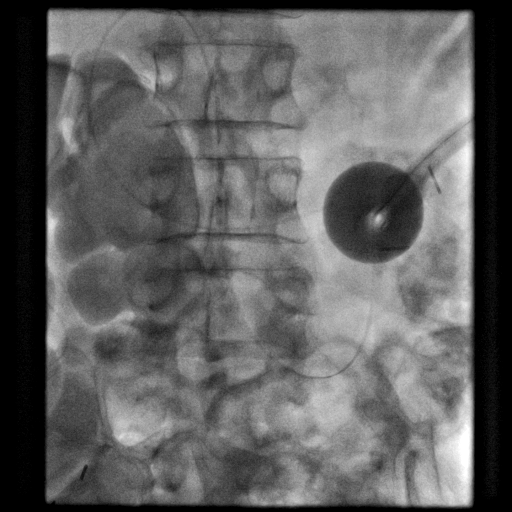
[frame 12/32]
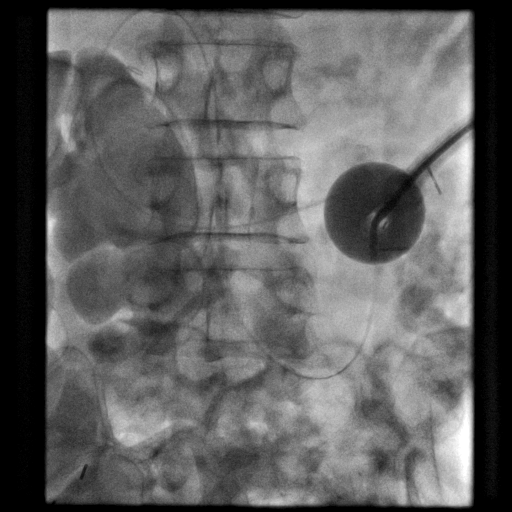
[frame 17/32]
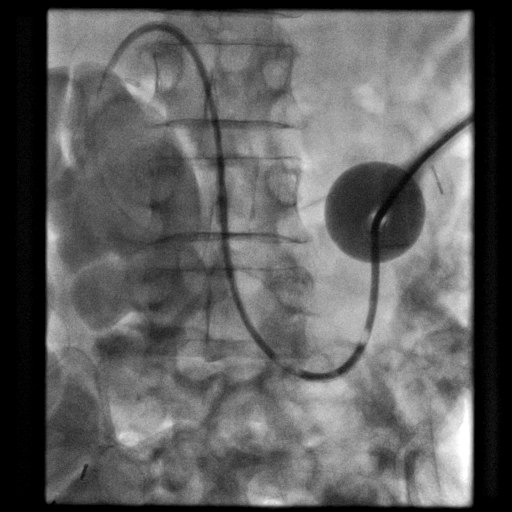
[frame 28/32]
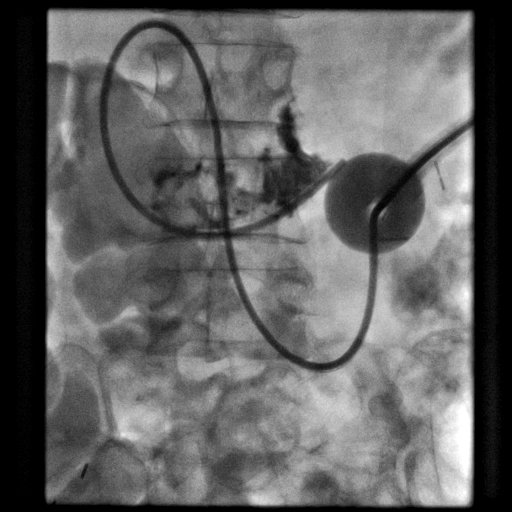

[Series 3: fl - angio · 4 of 9 frames shown (2 of 2)]
[frame 2/9]
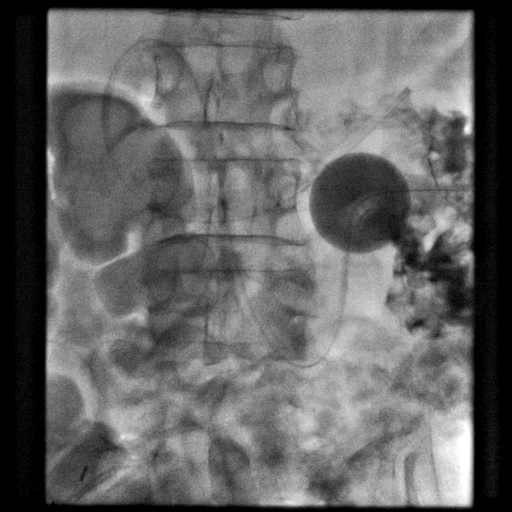
[frame 4/9]
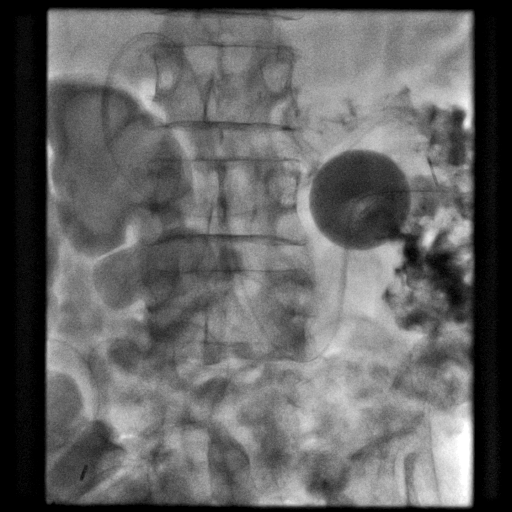
[frame 5/9]
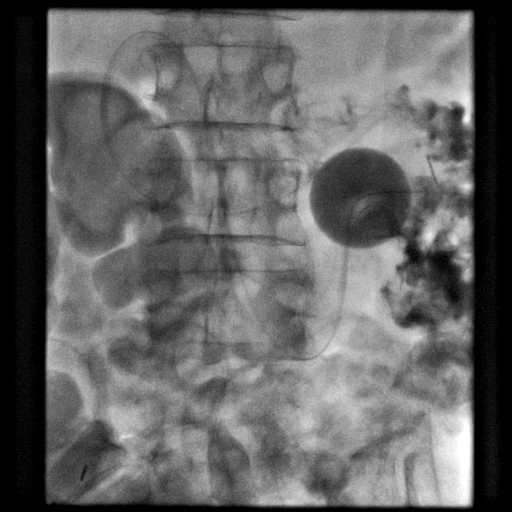
[frame 8/9]
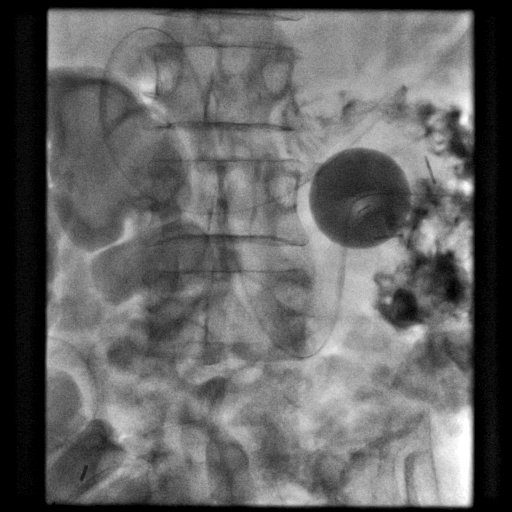

[8 of 8 positions shown; findings below may reference images not displayed]

EXAM:
CONVERT G-TUBE TO G-JTUBE

MEDICATIONS:
None.

ANESTHESIA/SEDATION:
None.

CONTRAST:  40mL OMNIPAQUE IOHEXOL 300 MG/ML SOLN - administered into
the gastric lumen.

FLUOROSCOPY TIME:  Fluoroscopy Time: 15 minutes 24 seconds (74 mGy).

COMPLICATIONS:
None immediate.

PROCEDURE:
Informed written consent was obtained from the patient after a
thorough discussion of the procedural risks, benefits and
alternatives. All questions were addressed. Maximal Sterile Barrier
Technique was utilized including caps, mask, sterile gowns, sterile
gloves, sterile drape, hand hygiene and skin antiseptic. A timeout
was performed prior to the initiation of the procedure.

Contrast was injected through the existing percutaneous gastrostomy
tube. The contrast fills the gastric lumen confirming that the tube
is in the expected position. The retention balloon appears slightly
overinflated. Therefore, the volume of the balloon was decreased by
approximately 3 cc. A 5 French angled glide catheter was then
advanced through the central lumen of the percutaneous gastrostomy
tube and into the stomach. Utilizing a Glidewire, the catheter and
wire combination were successfully advanced through the gastric
antrum, out the pylorus, through the duodenum and into the proximal
jejunum. There was significant tendency for the wire to prolapse
into the gastric fundus. Therefore, we decided to proceed with
exchange for a more robust wire platform. The glidewire was
exchanged for a road runner wire. A 12 French J arm extension was
then exchanged for the glide catheter and advanced over the wire and
positioned with the tip in the fourth portion of the duodenum at the
ligament of Treitz. Contrast injection was performed confirming the
location of the J arm extension. The J arm extension and G portion
of the tube were both flushed with saline.

Images were obtained and stored for the medical record.
IMPRESSION: Successful conversion to a gastro jejunostomy tube by adding a 12F
J-arm extension. The tip of the jejunostomy tube is at the ligament
of Treitz just within the proximal jejunum.

Tube ready for immediate use.

## 2022-11-21 ENCOUNTER — Encounter (HOSPITAL_BASED_OUTPATIENT_CLINIC_OR_DEPARTMENT_OTHER): Payer: Self-pay | Admitting: Emergency Medicine

## 2022-11-21 ENCOUNTER — Emergency Department (HOSPITAL_BASED_OUTPATIENT_CLINIC_OR_DEPARTMENT_OTHER)
Admission: EM | Admit: 2022-11-21 | Discharge: 2022-11-21 | Disposition: A | Discharge status: Home / Self Care | Payer: No Typology Code available for payment source | Attending: Emergency Medicine | Admitting: Emergency Medicine

## 2022-11-21 ENCOUNTER — Emergency Department (HOSPITAL_BASED_OUTPATIENT_CLINIC_OR_DEPARTMENT_OTHER): Payer: No Typology Code available for payment source

## 2022-11-21 ENCOUNTER — Other Ambulatory Visit: Payer: Self-pay

## 2022-11-21 DIAGNOSIS — J040 Acute laryngitis: Secondary | ICD-10-CM | POA: Diagnosis not present

## 2022-11-21 DIAGNOSIS — Z1152 Encounter for screening for COVID-19: Secondary | ICD-10-CM | POA: Insufficient documentation

## 2022-11-21 DIAGNOSIS — R911 Solitary pulmonary nodule: Secondary | ICD-10-CM

## 2022-11-21 DIAGNOSIS — R0602 Shortness of breath: Secondary | ICD-10-CM | POA: Diagnosis present

## 2022-11-21 DIAGNOSIS — Z7951 Long term (current) use of inhaled steroids: Secondary | ICD-10-CM | POA: Diagnosis not present

## 2022-11-21 DIAGNOSIS — J441 Chronic obstructive pulmonary disease with (acute) exacerbation: Secondary | ICD-10-CM | POA: Diagnosis not present

## 2022-11-21 LAB — CBC WITH DIFFERENTIAL/PLATELET
Abs Immature Granulocytes: 0.02 10*3/uL (ref 0.00–0.07)
Basophils Absolute: 0 10*3/uL (ref 0.0–0.1)
Basophils Relative: 1 %
Eosinophils Absolute: 0.1 10*3/uL (ref 0.0–0.5)
Eosinophils Relative: 1 %
HCT: 41.8 % (ref 36.0–46.0)
Hemoglobin: 12.8 g/dL (ref 12.0–15.0)
Immature Granulocytes: 0 %
Lymphocytes Relative: 51 %
Lymphs Abs: 3.2 10*3/uL (ref 0.7–4.0)
MCH: 26.8 pg (ref 26.0–34.0)
MCHC: 30.6 g/dL (ref 30.0–36.0)
MCV: 87.6 fL (ref 80.0–100.0)
Monocytes Absolute: 0.6 10*3/uL (ref 0.1–1.0)
Monocytes Relative: 10 %
Neutro Abs: 2.3 10*3/uL (ref 1.7–7.7)
Neutrophils Relative %: 37 %
Platelets: 318 10*3/uL (ref 150–400)
RBC: 4.77 MIL/uL (ref 3.87–5.11)
RDW: 13.6 % (ref 11.5–15.5)
WBC: 6.3 10*3/uL (ref 4.0–10.5)
nRBC: 0 % (ref 0.0–0.2)

## 2022-11-21 LAB — COMPREHENSIVE METABOLIC PANEL
ALT: 7 U/L (ref 0–44)
AST: 16 U/L (ref 15–41)
Albumin: 4.5 g/dL (ref 3.5–5.0)
Alkaline Phosphatase: 99 U/L (ref 38–126)
Anion gap: 7 (ref 5–15)
BUN: 7 mg/dL — ABNORMAL LOW (ref 8–23)
CO2: 35 mmol/L — ABNORMAL HIGH (ref 22–32)
Calcium: 10 mg/dL (ref 8.9–10.3)
Chloride: 100 mmol/L (ref 98–111)
Creatinine, Ser: 0.54 mg/dL (ref 0.44–1.00)
GFR, Estimated: >60 mL/min (ref >60)
Glucose, Bld: 104 mg/dL — ABNORMAL HIGH (ref 70–99)
Potassium: 4.1 mmol/L (ref 3.5–5.1)
Sodium: 142 mmol/L (ref 135–145)
Total Bilirubin: 0.3 mg/dL (ref 0.3–1.2)
Total Protein: 8 g/dL (ref 6.5–8.1)

## 2022-11-21 LAB — GROUP A STREP BY PCR: Group A Strep by PCR: NOT DETECTED

## 2022-11-21 LAB — RESP PANEL BY RT-PCR (RSV, FLU A&B, COVID)  RVPGX2
Influenza A by PCR: NEGATIVE
Influenza B by PCR: NEGATIVE
Resp Syncytial Virus by PCR: NEGATIVE
SARS Coronavirus 2 by RT PCR: NEGATIVE

## 2022-11-21 MED ORDER — ALBUTEROL SULFATE (2.5 MG/3ML) 0.083% IN NEBU
2.5000 mg | INHALATION_SOLUTION | Freq: Once | RESPIRATORY_TRACT | Status: AC
  Administered 2022-11-21: 2.5 mg via RESPIRATORY_TRACT
  Filled 2022-11-21: qty 3

## 2022-11-21 MED ORDER — PREDNISONE 10 MG PO TABS: 60.0000 mg | ORAL_TABLET | Freq: Every day | ORAL | 0 refills | Status: AC

## 2022-11-21 MED ORDER — IPRATROPIUM-ALBUTEROL 0.5-2.5 (3) MG/3ML IN SOLN
3.0000 mL | Freq: Once | RESPIRATORY_TRACT | Status: AC
  Administered 2022-11-21: 3 mL via RESPIRATORY_TRACT
  Filled 2022-11-21: qty 3

## 2022-11-21 MED ORDER — ALUM & MAG HYDROXIDE-SIMETH 200-200-20 MG/5ML PO SUSP
30.0000 mL | Freq: Once | ORAL | Status: AC
  Administered 2022-11-21: 30 mL via ORAL
  Filled 2022-11-21: qty 30

## 2022-11-21 MED ORDER — KETOROLAC TROMETHAMINE 30 MG/ML IJ SOLN
30.0000 mg | Freq: Once | INTRAMUSCULAR | Status: AC
  Administered 2022-11-21: 30 mg via INTRAVENOUS
  Filled 2022-11-21: qty 1

## 2022-11-21 MED ORDER — IOHEXOL 300 MG/ML  SOLN
100.0000 mL | Freq: Once | INTRAMUSCULAR | Status: AC | PRN
  Administered 2022-11-21: 50 mL via INTRAVENOUS

## 2022-11-21 MED ORDER — METHYLPREDNISOLONE SODIUM SUCC 125 MG IJ SOLR
125.0000 mg | Freq: Once | INTRAMUSCULAR | Status: AC
  Administered 2022-11-21: 125 mg via INTRAVENOUS
  Filled 2022-11-21: qty 2

## 2022-11-21 MED ORDER — LIDOCAINE VISCOUS HCL 2 % MT SOLN
15.0000 mL | Freq: Once | OROMUCOSAL | Status: AC
  Administered 2022-11-21: 15 mL via OROMUCOSAL
  Filled 2022-11-21: qty 15

## 2022-11-21 MED ORDER — MAGNESIUM SULFATE 2 GM/50ML IV SOLN
2.0000 g | Freq: Once | INTRAVENOUS | Status: AC
  Administered 2022-11-21: 2 g via INTRAVENOUS
  Filled 2022-11-21: qty 50

## 2022-11-21 NOTE — ED Provider Notes (Signed)
[place] [facility] Provider Note   CSN: [id number] Arrival date & time: 11/21/22  4098     History  Chief Complaint  Patient presents with   Oral Swelling   Shortness of Breath    Cl[facility] Wiley is a 67 y.o. female presented to ED with complaining of sore throat, difficulty speaking, painful swallowing.  The patient has a history of COPD and wears 2 L nasal cannula she reports at baseline.  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  They report the patient began having the symptoms yesterday, specifically with a hoarse and raspy voice, complaining of worsening sore throat, difficulty breathing and tightness around her throat.  HPI     Home Medications Prior to Admission medications   Medication Sig Start Date End Date Taking? Authorizing Provider  predniSONE (DELTASONE) 10 MG tablet Take 6 tablets (60 mg total) by mouth daily with breakfast for 5 days. 11/22/22 11/27/22 Yes Donavon Kimrey, Kermit Balo, MD  acetaminophen (TYLENOL) 160 MG/5ML solution Place 31.3 mLs (1,000 mg total) into feeding tube 3 (three) times daily. Patient taking differently: Take 1,000 mg by mouth 3 (three) times daily as needed for mild pain. 08/06/20   Maury Dus, MD  albuterol (PROVENTIL) (2.5 MG/3ML) 0.083% nebulizer solution Take 2.5 mg by nebulization every 6 (six) hours as needed for wheezing or shortness of breath.    [provider]  budesonide-formoterol (SYMBICORT) 160-4.5 MCG/ACT inhaler Inhale 1 puff into the lungs daily. Patient taking differently: Inhale 1 puff into the lungs in the morning and at bedtime. 12/21/19   Meryl Dare, MD  docusate (COLACE) 50 MG/5ML liquid Place 10 mLs (100 mg total) into feeding tube daily. Patient taking differently: Place 100 mg into feeding tube daily as needed for  mild constipation. 04/20/20   Jerald Kief, MD  famotidine (PEPCID) 20 MG tablet Take 1 tablet (20 mg total) by mouth 2 (two) times daily as needed for heartburn or indigestion (abdominal pain). 11/10/20   Petrucelli, Samantha R, PA-C  feeding supplement, ENSURE ENLIVE, (ENSURE ENLIVE) LIQD Take 237 mLs by mouth 2 (two) times daily between meals. 04/19/20   Jerald Kief, MD  fluticasone (FLONASE) 50 MCG/ACT nasal spray Place 1 spray into both nostrils daily as needed for allergies or rhinitis.    [provider]  HYDROcodone-acetaminophen (HYCET) 7.5-325 mg/15 ml solution Take 7.5 mL by mouth three times a day as needed for SEVERE pain. 08/04/21   Mesner, Barbara Cower, MD  hydrOXYzine (ATARAX) 10 MG/5ML syrup Place 5 mLs (10 mg total) into feeding tube 3 (three) times daily as needed for itching or anxiety. Patient taking differently: Take 10 mg by mouth 3 (three) times daily as needed for itching or anxiety. 08/06/20   Maury Dus, MD  LORazepam (ATIVAN) 0.5 MG tablet Take 1 tablet (0.5 mg total) by mouth every 8 (eight) hours as needed for anxiety. 11/07/20   Gerhard Munch, MD  mirtazapine (REMERON) 15 MG tablet Take 15 mg by mouth at bedtime. Crush and  put in pudding    [provider]  Nutritional Supplements (FEEDING SUPPLEMENT, OSMOLITE 1.2 CAL,) LIQD Place 1,000 mLs into feeding tube continuous. 04/19/20   Jerald Kief, MD  Nutritional Supplements (KATE FARMS STANDARD 1.4) LIQD TAKE 50 ML PER HOUR FOR 20 HOURS BY J TUBE CONTINUOUS VIA PUMP  SET PUMP TO PROVIDE WATER FLUSHES OF 20 ML EVERY OTHER HOUR DURING   CONTINOUS FEEDING. INCREASE TO 20 ML WATER EVERY HOUR IF SHOWING SIGNS OF  DEHYDRATION OR NOT DRINKING AS MUCH BY MOUTH. KEEP  ELEVATED  AT LEAST 45 DEGREES.  FOODS AND FLUIDS  AS DIRECTED BY PROVIDER. SET PUMP TO PROVIDE WATER FLUSHES OF 20 ML EVERY OTHER HOUR DURING    CONTINOUS FEEDING. INCREASE TO 20 ML WATER EVERY HOUR IF SHOWING SIGNS OF   DEHYDRATION OR NOT DRINKING AS  MUCH BY MOUTH. KEEP   ELEVATED  AT LEAST 45 DEGREES.  FOODS AND FLUIDS  AS DIRECTED BY PROVIDER. 10/24/20   [provider]  ondansetron (ZOFRAN ODT) 4 MG disintegrating tablet Take 1 tablet (4 mg total) by mouth every 8 (eight) hours as needed for nausea or vomiting. 11/10/20   Petrucelli, Samantha R, PA-C  pantoprazole sodium (PROTONIX) 40 mg/20 mL PACK Place 20 mLs (40 mg total) into feeding tube 2 (two) times daily. Patient taking differently: Take 40 mg by mouth 2 (two) times daily. Put in pudding 04/19/20   Jerald Kief, MD  predniSONE (DELTASONE) 20 MG tablet Take 2 tablets (40 mg total) by mouth daily with breakfast. For the next four days 11/07/20   Gerhard Munch, MD  pregabalin (LYRICA) 150 MG capsule Take 150 mg by mouth at bedtime. Put in pudding    [provider]  sucralfate (CARAFATE) 1 GM/10ML suspension Place 10 mLs (1 g total) into feeding tube 4 (four) times daily -  with meals and at bedtime. Patient taking differently: Take 1 g by mouth 4 (four) times daily -  with meals and at bedtime. 04/19/20   Jerald Kief, MD  Tiotropium Bromide Monohydrate 2.5 MCG/ACT AERS Inhale 2 puffs into the lungs daily.     [provider]  traMADol (ULTRAM) 50 MG tablet Take 50 mg by mouth every 12 (twelve) hours as needed for moderate pain (pain). Information from narcotic database  crush put in pudding    [provider]      Allergies    Azithromycin, Boniva [ibandronic acid], and Gabapentin    Review of Systems   Review of Systems  Physical Exam Updated Vital Signs BP (!) 148/75   Pulse (!) 103   Temp 98.4 F (36.9 C) (Oral)   Resp 11   Ht 4\' 11"  (1.499 m)   Wt 44.5 kg   SpO2 97%   BMI 19.79 kg/m  Physical Exam Constitutional:      General: She is not in acute distress.    Comments: Thin, frail, chronically ill-appearing  HENT:     Head: Normocephalic and atraumatic.     Comments: Erythema of peritonsillar region without visible  swelling Hoarse voice, choked No stridor Eyes:     Conjunctiva/sclera: Conjunctivae normal.     Pupils: Pupils are equal, round, and reactive to light.  Cardiovascular:     Rate and Rhythm: Normal rate and regular rhythm.  Pulmonary:     Effort: Pulmonary effort is normal.     Comments: Expiratory wheezing bilaterally Abdominal:     General: There is no distension.     Tenderness: There is no abdominal tenderness.  Skin:    General: Skin is warm and dry.  Neurological:     General: No focal deficit present.     Mental Status: She is alert. Mental status is at baseline.  Psychiatric:        Mood and Affect:  Mood normal.        Behavior: Behavior normal.     ED Results / Procedures / Treatments   Labs (all labs ordered are listed, but only abnormal results are displayed) Labs Reviewed  COMPREHENSIVE METABOLIC PANEL - Abnormal; Notable for the following components:      Result Value   CO2 35 (*)    Glucose, Bld 104 (*)    BUN 7 (*)    All other components within normal limits  GROUP A STREP BY PCR  RESP PANEL BY RT-PCR (RSV, FLU A&B, COVID)  RVPGX2  CBC WITH DIFFERENTIAL/PLATELET    EKG EKG Interpretation  Date/Time:  Thursday Nov 21 2022 08:48:57 EDT Ventricular Rate:  113 PR Interval:  189 QRS Duration: 65 QT Interval:  296 QTC Calculation: 406 R Axis:   80 Text Interpretation: Sinus tachycardia Biatrial enlargement Confirmed by Alvester Chou [phone]) on 11/21/2022 9:57:59 AM  Radiology CT Soft Tissue Neck W Contrast  Result Date: 11/21/2022 CLINICAL DATA:  Epiglottitis or tonsillitis suspected. Throat swelling and shortness of breath. EXAM: CT NECK WITH CONTRAST TECHNIQUE: Multidetector CT imaging of the neck was performed using the standard protocol following the bolus administration of intravenous contrast. RADIATION DOSE REDUCTION: This exam was performed according to the departmental dose-optimization program which includes automated exposure control,  adjustment of the mA and/or kV according to patient size and/or use of iterative reconstruction technique. CONTRAST:  50mL OMNIPAQUE IOHEXOL 300 MG/ML  SOLN COMPARISON:  None Available. FINDINGS: Pharynx and larynx: Normal. No mass or swelling.  Glottis is closed. Salivary glands: No inflammation, mass, or stone. Thyroid: Normal. Ectopic thyroid tissue within the left anterior paraglottic space. Lymph nodes: No suspicious cervical lymphadenopathy. Vascular: Normal. Limited intracranial: Unremarkable. Visualized orbits: Normal. Mastoids and visualized paranasal sinuses: Chronic left sphenoid sinusitis. Mastoids are well aerated. Skeleton: Mild cervical spondylosis without high-grade spinal canal stenosis. Upper chest: 1.3 x 1.1 cm solid, spiculated nodule in the right upper lobe (image 80 series 3). Other: None. IMPRESSION: 1. No acute abnormality of the neck. No evidence of tonsillitis or epiglottitis. 2. 1.3 cm solid, spiculated lung nodule in the right upper lobe, suspicious for primary lung adenocarcinoma. Chest CT without contrast is recommended for further evaluation. This recommendation follows the consensus statement: Guidelines for Management of Incidental Pulmonary Nodules Detected on CT Images: From the Fleischner Society 2017; Radiology 2017; 284:228-243. 3. Chronic left sphenoid sinusitis. Electronically Signed   By: Orvan Falconer M.D.   On: 11/21/2022 10:25   DG Chest Portable 1 View  Result Date: 11/21/2022 CLINICAL DATA:  Shortness of breath. EXAM: PORTABLE CHEST 1 VIEW COMPARISON:  11/09/2020. FINDINGS: Postoperative changes from prior left lung surgery. No consolidation or pulmonary edema. Normal heart size and mediastinal contours. No pleural effusion or pneumothorax. Visualized bones and upper abdomen are unremarkable. IMPRESSION: No evidence of acute cardiopulmonary disease. Electronically Signed   By: Orvan Falconer M.D.   On: 11/21/2022 09:28    Procedures Procedures    Medications  Ordered in ED Medications  ipratropium-albuterol (DUONEB) 0.5-2.5 (3) MG/3ML nebulizer solution 3 mL (3 mLs Nebulization Given 11/21/22 0849)  albuterol (PROVENTIL) (2.5 MG/3ML) 0.083% nebulizer solution 2.5 mg (2.5 mg Nebulization Given 11/21/22 0850)  ipratropium-albuterol (DUONEB) 0.5-2.5 (3) MG/3ML nebulizer solution 3 mL (3 mLs Nebulization Given 11/21/22 0946)  albuterol (PROVENTIL) (2.5 MG/3ML) 0.083% nebulizer solution 2.5 mg (2.5 mg Nebulization Given 11/21/22 0946)  ketorolac (TORADOL) 30 MG/ML injection 30 mg (30 mg Intravenous Given 11/21/22 0940)  methylPREDNISolone sodium succinate (SOLU-MEDROL)  125 mg/2 mL injection 125 mg (125 mg Intravenous Given 11/21/22 0920)  magnesium sulfate IVPB 2 g 50 mL (2 g Intravenous New Bag/Given 11/21/22 0957)  lidocaine (XYLOCAINE) 2 % viscous mouth solution 15 mL (15 mLs Mouth/Throat Given 11/21/22 0918)  alum & mag hydroxide-simeth (MAALOX/MYLANTA) 200-200-20 MG/5ML suspension 30 mL (30 mLs Oral Given 11/21/22 0940)  iohexol (OMNIPAQUE) 300 MG/ML solution 100 mL (50 mLs Intravenous Contrast Given 11/21/22 0931)    ED Course/ Medical Decision Making/ A&P Clinical Course as of 11/21/22 1456  Thu Nov 21, 2022  1039 Patient was reassessed.  She is feeling better, and her wheezing is completely resolved.  She continues to have hoarseness, which and I suspect is likely related to a viral laryngitis.  I do think is reasonable to treat her all the same for COPD exacerbation.  There is no indication for antibiotics.  We did discuss the incidental nodule that was noted on her CT scan in the right upper lobe, which could be concerning for malignancy.  She has a follow-up appointment scheduled already with her lung doctor that she is aware of and they will need to discuss this. [MT]    Clinical Course User Index [MT] Molly Maselli, Kermit Balo, MD                             Medical Decision Making Amount and/or Complexity of Data Reviewed Labs: ordered. Radiology:  ordered.  Risk OTC drugs. Prescription drug management.   This patient presents to the ED with concern for shortness of breath, sore throat, wheezing. This involves an extensive number of treatment options, and is a complaint that carries with it a high risk of complications and morbidity.  The differential diagnosis includes COPD exacerbation versus tonsillitis versus epiglottitis versus reflux gastritis versus viral URI versus other  Co-morbidities that complicate the patient evaluation: History of COPD at high risk of exacerbations  Additional history obtained from patient's daughter at bedside  External records from outside source obtained and reviewed including heart discharge summary from Jan 2022 the patient was admitted for dyspnea due to COVID-19 infection presumed COPD exacerbation.  I ordered and personally interpreted labs.  The pertinent results include: No emergent findings.  White blood cell count within normal limits.  Strep negative.  I ordered imaging studies including CT soft tissue neck, x-ray of the chest I independently visualized and interpreted imaging which showed no emergent findings of the neck or throat, upper right right lobe pulmonary nodule noted I agree with the radiologist interpretation  The patient was maintained on a cardiac monitor.  I personally viewed and interpreted the cardiac monitored which showed an underlying rhythm of: sinus tachycardia  Per my interpretation the patient's ECG shows no acute ischemic findings  I ordered medication including duoneb, iv mag, iv steroids for copd exacerbation  I have reviewed the patients home medicines and have made adjustments as needed  Test Considered: doubt acute PE  After the interventions noted above, I reevaluated the patient and found that they have: improved   Dispostion:  After consideration of the diagnostic results and the patients response to treatment, I feel that the patent would benefit  from close outpatient follow-up.  At this point there is no indication for antibiotics.  Patient is symptomatically improved and stable on her home oxygen levels, and reasonable to treat as outpatient.  CT finding of concerning pulmonary nodule discussed with the patient and her daughter, her  daughter reports that they have a follow-up visit on Monday for repeat imaging of the chest.         Final Clinical Impression(s) / ED Diagnoses Final diagnoses:  Laryngitis  COPD exacerbation (HCC)  Lung nodule    Rx / DC Orders ED Discharge Orders          Ordered    predniSONE (DELTASONE) 10 MG tablet  Daily with breakfast        11/21/22 1200              Terald Sleeper, MD 11/21/22 1459

## 2022-11-21 NOTE — Discharge Instructions (Addendum)
Please note that your CT scan picked up a 1.6 cm mass in the top of your right lung.  This needs attention and follow-up by your cancer doctor, pulmonologist, her primary care doctor.  This may be a sign of continued cancer in the lungs.  You will need repeat or dedicated imaging of your entire lungs in the nearby future, within the next month if possible.  Please contact the doctor that you see routinely for lung cancer screening and imaging.

## 2022-11-21 NOTE — ED Triage Notes (Signed)
Pt arrives to ED with c/o throat swelling and SOB that started this morning. Pt notes she developed a sore throat that started yesterday which made it difficult to swallow. She also notes congestion that started yesterday.

## 2022-11-21 NOTE — ED Notes (Signed)
Dc instructions reviewed with patient. Patient voiced understanding. Dc with belongings.  °

## 2023-08-12 NOTE — Progress Notes (Signed)
 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 Patient not surgical candidate.   07/31/2023 Dr. 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  CT for surveillance, f/u RUL nodule.  IMPRESSION: Gradually enlarging subpleural upper lobe nodule currently measures 10x0.8 cm compared to 0.7x0.5 cm April 2022.  Bronchogenic carcinoma should be considered, as previously mentioned. No significant change in irregular 1.1 cm opacity in the right middle lobe, nonspecific. Small focal subpleural opacity left lower lobe appears to be new.  Atelectasis is suspected.  Attention on follow up. No new adenopathy. 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  Carotid ultrasound is recommended to elevate for left subclavian steal.   11/21/2022 Dr. Donnice Hughes CT Soft Tissue Neck with Contrast CLINICAL DATA:  Epiglottitis or tonsillitis suspected. Throat swelling and shortness of breath.  IMPRESSION: 1. No acute abnormality of the neck. No evidence of tonsillitis or epiglottitis. 2. 1.3 cm solid, spiculated lung nodule in the right upper lobe, suspicious for primary lung adenocarcinoma. Chest CT without contrast is recommended for further evaluation. This recommendation follows the consensus statement: Guidelines for Management of Incidental Pulmonary Nodules Detected on CT Images: From the Fleischner Society 2017; Radiology 2017; 284:228-243. 3. Chronic left sphenoid sinusitis.    Past/Anticipated interventions by cardiothoracic surgery, if any: NA  Past/Anticipated interventions by medical oncology, if any: NA   Tobacco/Marijuana/Snuff/ETOH use: Former smoker, no drug, alcohol, or  snuff use.  Signs/Symptoms Weight changes, if any: Yes, severely underweight. Respiratory complaints, if any:  2 liters oxygen Hemoptysis, if any: No Pain issues, if any:  0/10  SAFETY ISSUES: Prior radiation? No Pacemaker/ICD?  No Possible current pregnancy? Hysterectomy Is the patient on methotrexate? No  Current Complaints / other details:

## 2023-08-18 ENCOUNTER — Encounter: Payer: Self-pay | Admitting: Urology

## 2023-08-18 DIAGNOSIS — R911 Solitary pulmonary nodule: Secondary | ICD-10-CM | POA: Insufficient documentation

## 2023-08-18 NOTE — Progress Notes (Signed)
 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 Nodule of upper lobe of right lung  R91.1       HISTORY OF PRESENT ILLNESS: Claudia Wiley is a 68 y.o. female seen at the request of Dr. Abram at the [facility].  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  She has been followed with serial CT chest scans through the [facility] since that time.  Her most recent CT chest scan from 07/31/2023 revealed a slowly enlarging, cystic RUL nodule with increasing solid nodularity measuring approximately 1 cm, as compared to 0.7 cm in 10/2020, concerning for a new primary bronchogenic carcinoma.  There was an unchanged appearance of a RML nodule that measures approximately 1.1 cm and no evidence of lymphadenopathy but there was a new small subpleural opacity in the LLL lung.  Her case and imaging were reviewed at the [facility] tumor board and she was not felt to be a candidate for surgical resection or biopsy due to underlying lung disease so the recommendation was for empiric stereotactic body radiotherapy (SBRT).  The nodule is shown here on neck CT from 11/21/22    She lives in [place] and preferred to have her treatment locally so she has been kindly referred to us  today to discuss the potential radiation treatment options.  PREVIOUS RADIATION THERAPY: No  PAST MEDICAL HISTORY:  Past Medical History:  Diagnosis Date   Anemia    Arthritis    Cancer (HCC) 2013   Upper left lung and stomach cancer   COPD  (chronic obstructive pulmonary disease) (HCC)    Esophageal stricture    Family history of brain cancer    Family history of melanoma    Gastrinoma    Gastrinoma    GERD (gastroesophageal reflux disease)    Hypertension    Osteoporosis    Pancreatic insufficiency    Pneumonia    PONV (postoperative nausea and vomiting)    Vitamin D deficiency    Weight loss, unintentional       PAST SURGICAL HISTORY: Past Surgical History:  Procedure Laterality Date   ABDOMINAL HYSTERECTOMY     BIOPSY  09/11/2019   Procedure: BIOPSY;  Surgeon: San Sandor GAILS, DO;  Location: [facility];  Service: Gastroenterology;;   BIOPSY  10/03/2019   Procedure: BIOPSY;  Surgeon: Albertus Gordy HERO, MD;  [facility];  Service: Gastroenterology;;   BIOPSY  04/07/2020   Procedure: BIOPSY;  Surgeon: San Sandor GAILS, DO;  [facility];  Service: Gastroenterology;;   BREAST LUMPECTOMY     CESAREAN SECTION     ESOPHAGEAL DILATION  03/13/2020   Procedure: ESOPHAGEAL DILATION;  Surgeon: Aneita Gwendlyn DASEN, MD;  [facility]: [facility];  Service: Endoscopy;;   ESOPHAGOGASTRODUODENOSCOPY (EGD) WITH PROPOFOL  N/A 09/11/2019   Procedure: ESOPHAGOGASTRODUODENOSCOPY (EGD) WITH PROPOFOL ;  Surgeon: San Sandor GAILS, DO;  [facility];  Service: Gastroenterology;  Laterality: N/A;   ESOPHAGOGASTRODUODENOSCOPY (EGD) WITH PROPOFOL  N/A 10/03/2019   Procedure: ESOPHAGOGASTRODUODENOSCOPY (EGD) WITH PROPOFOL ;  Surgeon: Albertus,  Gordy HERO, MD;  Location: [facility];  Service: Gastroenterology;  Laterality: N/A;   ESOPHAGOGASTRODUODENOSCOPY (EGD) WITH PROPOFOL  N/A 10/19/2019   Procedure: ESOPHAGOGASTRODUODENOSCOPY (EGD) WITH PROPOFOL ;  Surgeon: Leigh Elspeth SQUIBB, MD;  [facility];  Service: Gastroenterology;  Laterality: N/A;  possible dilation   ESOPHAGOGASTRODUODENOSCOPY (EGD) WITH PROPOFOL  N/A 11/02/2019   Procedure: ESOPHAGOGASTRODUODENOSCOPY (EGD) WITH FLUORO AND  WITH PROPOFOL ;  Surgeon: Abran Norleen SAILOR, MD;  [facility];  Service: Endoscopy;  Laterality: N/A;  need fluoro   ESOPHAGOGASTRODUODENOSCOPY (EGD) WITH PROPOFOL  N/A 11/16/2019   Procedure: ESOPHAGOGASTRODUODENOSCOPY (EGD) WITH PROPOFOL ;  Surgeon: Aneita Gwendlyn DASEN, MD;  [facility];  Service: Endoscopy;  Laterality: N/A;   ESOPHAGOGASTRODUODENOSCOPY (EGD) WITH PROPOFOL  N/A 11/29/2019   Procedure: ESOPHAGOGASTRODUODENOSCOPY (EGD) WITH PROPOFOL ;  Surgeon: Abran Norleen SAILOR, MD;  [facility];  Service: Endoscopy;  Laterality: N/A;  need fluoro   ESOPHAGOGASTRODUODENOSCOPY (EGD) WITH PROPOFOL  N/A 12/21/2019   Procedure: ESOPHAGOGASTRODUODENOSCOPY (EGD) WITH PROPOFOL ;  Surgeon: Aneita Gwendlyn DASEN, MD;  [facility];  Service: Endoscopy;  Laterality: N/A;   ESOPHAGOGASTRODUODENOSCOPY (EGD) WITH PROPOFOL  N/A 02/07/2020   Procedure: ESOPHAGOGASTRODUODENOSCOPY (EGD) WITH PROPOFOL ;  Surgeon: Aneita Gwendlyn DASEN, MD;  [facility];  Service: Endoscopy;  Laterality: N/A;   ESOPHAGOGASTRODUODENOSCOPY (EGD) WITH PROPOFOL  N/A 03/13/2020   Procedure: ESOPHAGOGASTRODUODENOSCOPY (EGD) WITH PROPOFOL ;  Surgeon: Aneita Gwendlyn DASEN, MD;  [facility];  Service: Endoscopy;  Laterality: N/A;  With dilation   ESOPHAGOGASTRODUODENOSCOPY (EGD) WITH PROPOFOL  N/A 04/07/2020   Procedure: ESOPHAGOGASTRODUODENOSCOPY (EGD) WITH PROPOFOL ;  Surgeon: San Sandor GAILS, DO;  [facility];  Service: Gastroenterology;  Laterality: N/A;   ESOPHAGOGASTRODUODENOSCOPY (EGD) WITH PROPOFOL  N/A 04/25/2020   Procedure: ESOPHAGOGASTRODUODENOSCOPY (EGD) WITH PROPOFOL ;  Surgeon: Aneita Gwendlyn DASEN, MD;  [facility];  Service: Endoscopy;  Laterality: N/A;   IR GASTR TUBE CONVERT GASTR-JEJ PER W/FL MOD SED  04/18/2020   IR GASTROSTOMY TUBE MOD SED  04/13/2020   IR GJ TUBE CHANGE  08/16/2020   IR GJ TUBE CHANGE  02/27/2021   IR GJ TUBE CHANGE  06/08/2021   IR RADIOLOGIST EVAL & MGMT  05/03/2020   LUNG SURGERY     SAVORY  DILATION N/A 10/19/2019   Procedure: SAVORY DILATION;  Surgeon: Leigh Elspeth SQUIBB, MD;  [facility];  Service: Gastroenterology;  Laterality: N/A;   SAVORY DILATION N/A 11/02/2019   Procedure: SAVORY DILATION;  Surgeon: Abran Norleen SAILOR, MD;  [facility];  Service: Endoscopy;  Laterality: N/A;  fluoro   SAVORY DILATION N/A 11/16/2019   Procedure: SAVORY DILATION;  Surgeon: Aneita Gwendlyn DASEN, MD;  [facility];  Service: Endoscopy;  Laterality: N/A;   SAVORY DILATION N/A 11/29/2019   Procedure: SAVORY DILATION;  Surgeon: Abran Norleen SAILOR, MD;  [facility];  Service: Endoscopy;  Laterality: N/A;   SAVORY DILATION N/A 12/21/2019   Procedure: SAVORY DILATION;  Surgeon: Aneita Gwendlyn DASEN, MD;  [facility];  Service: Endoscopy;  Laterality: N/A;   SAVORY DILATION N/A 02/07/2020   Procedure: SAVORY DILATION;  Surgeon: Aneita Gwendlyn DASEN, MD;  [facility];  Service: Endoscopy;  Laterality: N/A;   SAVORY DILATION N/A 04/25/2020   Procedure: SAVORY DILATION;  Surgeon: Aneita Gwendlyn DASEN, MD;  [facility];  Service: Endoscopy;  Laterality: N/A;    FAMILY HISTORY:  Family History  Problem Relation Age of Onset   Diabetes Sister        x 3   Cancer Sister 33  brain that metastized to bone, stomach and other organs   Melanoma Maternal Aunt    Heart attack Father    Pneumonia Brother    Cancer Other 17       cancer in his chest - nephew   Colon cancer Neg Hx    Pancreatic cancer Neg Hx    Rectal cancer Neg Hx    Esophageal cancer Neg Hx     SOCIAL HISTORY:  Social History   Socioeconomic History   Marital status: Single    Spouse name: Not on file   Number of children: Not on file   Years of education: Not on file   Highest education level: Not on file  Occupational History   Not on file  Tobacco Use   Smoking status: Former    Current packs/day: 0.00    Average packs/day: 0.3 packs/day for 2.0 years (0.5 ttl pk-yrs)     Types: Cigarettes    Start date: 05/29/2017    Quit date: 05/30/2019    Years since quitting: 4.2   Smokeless tobacco: Never   Tobacco comments:    2 months- quit date  Vaping Use   Vaping status: Never Used  Substance and Sexual Activity   Alcohol use: No   Drug use: No   Sexual activity: Never  Other Topics Concern   Not on file  Social History Narrative   Not on file   Social Drivers of Health   Financial Resource Strain: Not on file  Food Insecurity: Not on file  Transportation Needs: Not on file  Physical Activity: Not on file  Stress: Not on file  Social Connections: Not on file  Intimate Partner Violence: Not on file    ALLERGIES: Azithromycin, Boniva [ibandronic acid], and Gabapentin  MEDICATIONS:  Current Outpatient Medications  Medication Sig Dispense Refill   acetaminophen  (TYLENOL ) 160 MG/5ML solution Place 31.3 mLs (1,000 mg total) into feeding tube 3 (three) times daily. (Patient taking differently: Take 1,000 mg by mouth 3 (three) times daily as needed for mild pain.) 120 mL 0   albuterol  (PROVENTIL ) (2.5 MG/3ML) 0.083% nebulizer solution Take 2.5 mg by nebulization every 6 (six) hours as needed for wheezing or shortness of breath.     budesonide -formoterol  (SYMBICORT ) 160-4.5 MCG/ACT inhaler Inhale 1 puff into the lungs daily. (Patient taking differently: Inhale 1 puff into the lungs in the morning and at bedtime.) 1 Inhaler 12   docusate (COLACE) 50 MG/5ML liquid Place 10 mLs (100 mg total) into feeding tube daily. (Patient taking differently: Place 100 mg into feeding tube daily as needed for mild constipation.) 100 mL 0   famotidine  (PEPCID ) 20 MG tablet Take 1 tablet (20 mg total) by mouth 2 (two) times daily as needed for heartburn or indigestion (abdominal pain). 30 tablet 0   feeding supplement, ENSURE ENLIVE, (ENSURE ENLIVE) LIQD Take 237 mLs by mouth 2 (two) times daily between meals. 237 mL 5   fluticasone  (FLONASE ) 50 MCG/ACT nasal spray Place 1  spray into both nostrils daily as needed for allergies or rhinitis.     HYDROcodone -acetaminophen  (HYCET) 7.5-325 mg/15 ml solution Take 7.5 mL by mouth three times a day as needed for SEVERE pain. 473 mL 0   hydrOXYzine  (ATARAX ) 10 MG/5ML syrup Place 5 mLs (10 mg total) into feeding tube 3 (three) times daily as needed for itching or anxiety. (Patient taking differently: Take 10 mg by mouth 3 (three) times daily as needed for itching or anxiety.) 240 mL 0  LORazepam  (ATIVAN ) 0.5 MG tablet Take 1 tablet (0.5 mg total) by mouth every 8 (eight) hours as needed for anxiety. 15 tablet 0   mirtazapine  (REMERON ) 15 MG tablet Take 15 mg by mouth at bedtime. Crush and  put in pudding     Nutritional Supplements (FEEDING SUPPLEMENT, OSMOLITE 1.2 CAL,) LIQD Place 1,000 mLs into feeding tube continuous. 1000 mL 0   Nutritional Supplements (KATE FARMS STANDARD 1.4) LIQD TAKE 50 ML PER HOUR FOR 20 HOURS BY J TUBE CONTINUOUS VIA PUMP SET PUMP TO PROVIDE WATER  FLUSHES OF 20 ML EVERY OTHER HOUR DURING   CONTINOUS FEEDING. INCREASE TO 20 ML WATER  EVERY HOUR IF SHOWING SIGNS OF  DEHYDRATION OR NOT DRINKING AS MUCH BY MOUTH. KEEP  ELEVATED  AT LEAST 45 DEGREES.  FOODS AND FLUIDS  AS DIRECTED BY PROVIDER. SET PUMP TO PROVIDE WATER  FLUSHES OF 20 ML EVERY OTHER HOUR DURING    CONTINOUS FEEDING. INCREASE TO 20 ML WATER  EVERY HOUR IF SHOWING SIGNS OF   DEHYDRATION OR NOT DRINKING AS MUCH BY MOUTH. KEEP   ELEVATED  AT LEAST 45 DEGREES.  FOODS AND FLUIDS  AS DIRECTED BY PROVIDER.     ondansetron  (ZOFRAN  ODT) 4 MG disintegrating tablet Take 1 tablet (4 mg total) by mouth every 8 (eight) hours as needed for nausea or vomiting. 10 tablet 0   pantoprazole  sodium (PROTONIX ) 40 mg/20 mL PACK Place 20 mLs (40 mg total) into feeding tube 2 (two) times daily. (Patient taking differently: Take 40 mg by mouth 2 (two) times daily. Put in pudding) 30 mL 0   predniSONE  (DELTASONE ) 20 MG tablet Take 2 tablets (40 mg total) by mouth daily with  breakfast. For the next four days 8 tablet 0   pregabalin  (LYRICA ) 150 MG capsule Take 150 mg by mouth at bedtime. Put in pudding     sucralfate  (CARAFATE ) 1 GM/10ML suspension Place 10 mLs (1 g total) into feeding tube 4 (four) times daily -  with meals and at bedtime. (Patient taking differently: Take 1 g by mouth 4 (four) times daily -  with meals and at bedtime.) 420 mL 0   Tiotropium Bromide  Monohydrate 2.5 MCG/ACT AERS Inhale 2 puffs into the lungs daily.      traMADol  (ULTRAM ) 50 MG tablet Take 50 mg by mouth every 12 (twelve) hours as needed for moderate pain (pain). Information from narcotic database  crush put in pudding     No current facility-administered medications for this visit.    REVIEW OF SYSTEMS:  On review of systems, the patient reports that she is doing well overall. She denies any chest pain, shortness of breath, cough, fevers, chills, night sweats, unintended weight changes. She denies any bowel or bladder disturbances, and denies abdominal pain, nausea or vomiting. She denies any new musculoskeletal or joint aches or pains. A complete review of systems is obtained and is otherwise negative.    PHYSICAL EXAM:  Wt Readings from Last 3 Encounters:  11/21/22 98 lb (44.5 kg)  08/03/21 103 lb (46.7 kg)  11/07/20 84 lb (38.1 kg)   Temp Readings from Last 3 Encounters:  11/21/22 98.4 F (36.9 C) (Oral)  08/03/21 99.1 F (37.3 C) (Oral)  11/10/20 98.9 F (37.2 C) (Oral)   BP Readings from Last 3 Encounters:  11/21/22 (!) 148/75  08/04/21 111/77  11/10/20 (!) 155/98   Pulse Readings from Last 3 Encounters:  11/21/22 (!) 103  08/04/21 95  11/10/20 100    /10  In  general this is a well appearing African-American female in no acute distress.  She's alert and oriented x4 and appropriate throughout the examination. Cardiopulmonary assessment is negative for acute distress and she looks exhibits normal effort.   KPS = 100  100 - Normal; no complaints; no evidence  of disease. 90   - Able to carry on normal activity; minor signs or symptoms of disease. 80   - Normal activity with effort; some signs or symptoms of disease. 57   - Cares for self; unable to carry on normal activity or to do active work. 60   - Requires occasional assistance, but is able to care for most of his personal needs. 50   - Requires considerable assistance and frequent medical care. 40   - Disabled; requires special care and assistance. 30   - Severely disabled; hospital admission is indicated although death not imminent. 20   - Very sick; hospital admission necessary; active supportive treatment necessary. 10   - Moribund; fatal processes progressing rapidly. 0     - Dead  Karnofsky DA, Abelmann WH, Craver LS and Burchenal [facility] [phone]) The use of the nitrogen mustards in the palliative treatment of carcinoma: with particular reference to bronchogenic carcinoma Cancer 1 634-56  LABORATORY DATA:  Lab Results  Component Value Date   WBC 6.3 11/21/2022   HGB 12.8 11/21/2022   HCT 41.8 11/21/2022   MCV 87.6 11/21/2022   PLT 318 11/21/2022   Lab Results  Component Value Date   NA 142 11/21/2022   K 4.1 11/21/2022   CL 100 11/21/2022   CO2 35 (H) 11/21/2022   Lab Results  Component Value Date   ALT 7 11/21/2022   AST 16 11/21/2022   ALKPHOS 99 11/21/2022   BILITOT 0.3 11/21/2022     RADIOGRAPHY: No results found.    IMPRESSION/PLAN: 59. 68 year old woman with a putative Stage cT1a cN0 cM0 non-small cell carcinoma of the RUL lung Today, we talked to the patient and family about the findings and workup thus far. We discussed the natural history of early-stage non-small cell lung carcinoma and general treatment, highlighting the role of radiotherapy in the management.  We have requested her imaging from the [facility] for review and recommended a disease staging PET scan prior to finalizing a formal treatment recommendation.  We discussed the available radiation techniques, and  focused on the details and logistics of delivery. We reviewed the anticipated acute and late sequelae associated with radiation in this setting. The patient was encouraged to ask questions that were answered to her stated satisfaction.  At the conclusion of our conversation, the patient is in agreement to proceed with PET-CT imaging for staging purposes, and pending that result, probable SBRT.  Thank you.  I just saw her and placed an order She has prior authorization for a PET-CT in her ambulatory referral from the [place] (media tab) dated 08/06/2023 on page 11.  We'll schedule PET-CT with her daughter Marcus (pronounced Cherish) Hubbard.  [name] is a engineer, civil (consulting) at [employer] [facility] at [phone].  We'll follow-up with [name] with PET-CT results and likely proceed with either clinic follow-up or straight to CT sim for SBRT pending PET.  Her daughter prefers PET to be at [facility] rather than traveling to other campuses.  I personally spent 60 minutes in this encounter including chart review, reviewing radiological studies, meeting face-to-face with the patient, entering orders and completing documentation.   ------------------------------------------------   Donnice Barge, MD [facility]  Radiation Oncology Direct Dial: [phone]  Fax: [phone] [email or web address]  Skype  [email or web address]

## 2023-08-19 ENCOUNTER — Other Ambulatory Visit: Payer: Self-pay

## 2023-08-19 ENCOUNTER — Ambulatory Visit
Admission: RE | Admit: 2023-08-19 | Discharge: 2023-08-19 | Disposition: A | Discharge status: Home / Self Care | Payer: No Typology Code available for payment source | Source: Ambulatory Visit | Attending: Radiation Oncology | Admitting: Radiation Oncology

## 2023-08-19 ENCOUNTER — Encounter: Payer: Self-pay | Admitting: Radiation Oncology

## 2023-08-19 ENCOUNTER — Ambulatory Visit
Admission: RE | Admit: 2023-08-19 | Discharge: 2023-08-19 | Disposition: A | Discharge status: Home / Self Care | Payer: Self-pay | Source: Ambulatory Visit | Attending: Urology | Admitting: Urology

## 2023-08-19 VITALS — BP 101/69 | HR 99 | Temp 96.8°F | Resp 20 | Ht 59.0 in | Wt 96.8 lb

## 2023-08-19 DIAGNOSIS — D649 Anemia, unspecified: Secondary | ICD-10-CM | POA: Diagnosis not present

## 2023-08-19 DIAGNOSIS — Z7952 Long term (current) use of systemic steroids: Secondary | ICD-10-CM | POA: Insufficient documentation

## 2023-08-19 DIAGNOSIS — E559 Vitamin D deficiency, unspecified: Secondary | ICD-10-CM | POA: Diagnosis not present

## 2023-08-19 DIAGNOSIS — R911 Solitary pulmonary nodule: Secondary | ICD-10-CM

## 2023-08-19 DIAGNOSIS — M810 Age-related osteoporosis without current pathological fracture: Secondary | ICD-10-CM | POA: Diagnosis not present

## 2023-08-19 DIAGNOSIS — Z79899 Other long term (current) drug therapy: Secondary | ICD-10-CM | POA: Insufficient documentation

## 2023-08-19 DIAGNOSIS — Z87891 Personal history of nicotine dependence: Secondary | ICD-10-CM | POA: Diagnosis not present

## 2023-08-19 DIAGNOSIS — C3411 Malignant neoplasm of upper lobe, right bronchus or lung: Secondary | ICD-10-CM | POA: Diagnosis present

## 2023-08-19 DIAGNOSIS — J449 Chronic obstructive pulmonary disease, unspecified: Secondary | ICD-10-CM | POA: Insufficient documentation

## 2023-08-19 DIAGNOSIS — Z808 Family history of malignant neoplasm of other organs or systems: Secondary | ICD-10-CM | POA: Insufficient documentation

## 2023-08-19 DIAGNOSIS — Z7951 Long term (current) use of inhaled steroids: Secondary | ICD-10-CM | POA: Diagnosis not present

## 2023-08-19 DIAGNOSIS — K219 Gastro-esophageal reflux disease without esophagitis: Secondary | ICD-10-CM | POA: Diagnosis not present

## 2023-08-19 NOTE — Progress Notes (Signed)
Reached out to Aileen Pilot, [facility]. She will PowerShare pts CT Chest scan from 07/30/2022 to [facility].

## 2023-08-20 NOTE — Progress Notes (Signed)
 [facility] by [facility] that [facility] has been [facility] to [facility]. I called IT to have image uploaded to pts record. IT rep was able to see the image and has pushed the image to pts EMR.

## 2023-08-20 NOTE — Progress Notes (Signed)
 I called pt's dtr, [name], twice at number provided to arrange for pt's PET scan. No answer, unable to leave a VM.

## 2023-08-21 ENCOUNTER — Telehealth: Payer: Self-pay | Admitting: *Deleted

## 2023-08-21 NOTE — Telephone Encounter (Signed)
 Called patient's daughterKatharina Palin to ask questions, lvm for a return call

## 2023-08-21 NOTE — Telephone Encounter (Signed)
 Called patient to inform of Pet Scan for 08-25-23- arrival time- 4 pm @ [facility], water  only 6 hrs. prior to test, spoke with patient 's daughterJola Nash and she is aware of this scan and the instructions

## 2023-08-25 ENCOUNTER — Ambulatory Visit (HOSPITAL_COMMUNITY)
Admission: RE | Admit: 2023-08-25 | Discharge: 2023-08-25 | Disposition: A | Discharge status: Home / Self Care | Payer: No Typology Code available for payment source | Source: Ambulatory Visit | Attending: Radiation Oncology | Admitting: Radiation Oncology

## 2023-08-25 DIAGNOSIS — R911 Solitary pulmonary nodule: Secondary | ICD-10-CM | POA: Insufficient documentation

## 2023-08-25 DIAGNOSIS — C3411 Malignant neoplasm of upper lobe, right bronchus or lung: Secondary | ICD-10-CM | POA: Insufficient documentation

## 2023-08-25 LAB — GLUCOSE, CAPILLARY: Glucose-Capillary: 105 mg/dL — ABNORMAL HIGH (ref 70–99)

## 2023-08-25 MED ORDER — FLUDEOXYGLUCOSE F - 18 (FDG) INJECTION
4.8000 | Freq: Once | INTRAVENOUS | Status: AC | PRN
  Administered 2023-08-25: 4.96 via INTRAVENOUS

## 2023-09-01 ENCOUNTER — Telehealth: Payer: Self-pay | Admitting: Urology

## 2023-09-01 NOTE — Telephone Encounter (Signed)
I called both the patient and her daughter, Placido Sou, to review the results of her recent PET scan which shows a hypermetabolic spiculated right upper lobe lung nodule, consistent with Stage IA primary bronchogenic carcinoma.  There was some peribronchovascular and subpleural nodular consolidation with surrounding groundglass in the lateral left lower lobe lung with some mild hypermetabolism, favored to be infectious/inflammatory and an 11 mm groundglass nodule in the lateral right middle lobe that remains stable.  Therefore, we will proceed as planned with CT simulation/treatment planning at 10 AM on Friday, 09/05/2023, in anticipation of beginning her 5 fraction course of SBRT on Monday, 09/15/2023.  They know that they are welcome to call at anytime in the interim with any questions or concerns.  Marguarite Arbour, MMS, PA-C Deputy  Can[facility] at [facility] Radiation Oncology Physician Assistant Direct Dial: [phone]  Fax: [phone]

## 2023-09-04 NOTE — Progress Notes (Signed)
  Radiation Oncology         [phone] ________________________________  Name: Claudia Wiley MRN: [id number]  Date: 09/05/2023  DOB: 1955/11/20  STEREOTACTIC BODY RADIOTHERAPY SIMULATION AND TREATMENT PLANNING NOTE    ICD-10-CM   1. 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  Identity was confirmed.  All relevant records and images related to the planned course of therapy were reviewed.  The patient freely provided informed written consent to proceed with treatment after reviewing the details related to the planned course of therapy. The consent form was witnessed and verified by the simulation staff.  Then, the patient was set-up in a stable reproducible  supine position for radiation therapy.  A BodyFix immobilization pillow was fabricated for reproducible positioning.  Then I personally applied the abdominal compression paddle to limit respiratory excursion.  4D respiratoy motion management CT images were obtained.  Surface markings were placed.  The CT images were loaded into the planning software.  Then, using Cine, MIP, and standard views, the internal target volume (ITV) and planning target volumes (PTV) were delinieated, and avoidance structures were contoured.  Treatment planning then occurred.  The radiation prescription was entered and confirmed.  A total of two complex treatment devices were fabricated in the form of the BodyFix immobilization pillow and a neck accuform cushion.  I have requested : 3D Simulation  I have requested a DVH of the following structures: Heart, Lungs, Esophagus, Chest Wall, Brachial Plexus, Major Blood Vessels, and targets.  SPECIAL TREATMENT PROCEDURE:  The planned course of therapy using radiation constitutes a special treatment procedure. Special  care is required in the management of this patient for the following reasons. This treatment constitutes a Special Treatment Procedure for the following reason: [ High dose per fraction requiring special monitoring for increased toxicities of treatment including daily imaging..  The special nature of the planned course of radiotherapy will require increased physician supervision and oversight to ensure patient's safety with optimal treatment outcomes.  This requires extended time and effort.    RESPIRATORY MOTION MANAGEMENT SIMULATION:  In order to account for effect of respiratory motion on target structures and other organs in the planning and delivery of radiotherapy, this patient underwent respiratory motion management simulation.  To accomplish this, when the patient was brought to the CT simulation planning suite, 4D respiratoy motion management CT images were obtained.  The CT images were loaded into the planning software.  Then, using a variety of tools including Cine, MIP, and standard views, the target volume and planning target volumes (PTV) were delineated.  Avoidance structures were contoured.  Treatment planning then occurred.  Dose volume histograms were generated and reviewed for each of the requested structure.  The resulting plan was carefully reviewed and approved today.  PLAN:  The patient will receive 54 Gy in 3 fractions.  ________________________________  Artist Pais Kathrynn Running, M.D.

## 2023-09-05 ENCOUNTER — Ambulatory Visit
Admission: RE | Admit: 2023-09-05 | Discharge: 2023-09-05 | Disposition: A | Discharge status: Home / Self Care | Payer: Medicare Other | Source: Ambulatory Visit | Attending: Urology | Admitting: Urology

## 2023-09-05 DIAGNOSIS — C3411 Malignant neoplasm of upper lobe, right bronchus or lung: Secondary | ICD-10-CM | POA: Insufficient documentation

## 2023-09-08 DIAGNOSIS — C3411 Malignant neoplasm of upper lobe, right bronchus or lung: Secondary | ICD-10-CM | POA: Diagnosis not present

## 2023-09-17 ENCOUNTER — Other Ambulatory Visit: Payer: Self-pay

## 2023-09-17 ENCOUNTER — Ambulatory Visit
Admission: RE | Admit: 2023-09-17 | Discharge: 2023-09-17 | Disposition: A | Discharge status: Home / Self Care | Payer: Medicare Other | Source: Ambulatory Visit | Attending: Radiation Oncology | Admitting: Radiation Oncology

## 2023-09-17 DIAGNOSIS — C3411 Malignant neoplasm of upper lobe, right bronchus or lung: Secondary | ICD-10-CM | POA: Insufficient documentation

## 2023-09-17 LAB — RAD ONC ARIA SESSION SUMMARY
Course Elapsed Days: 0
Plan Fractions Treated to Date: 1
Plan Prescribed Dose Per Fraction: 18 Gy
Plan Total Fractions Prescribed: 3
Plan Total Prescribed Dose: 54 Gy
Reference Point Dosage Given to Date: 18 Gy
Reference Point Session Dosage Given: 18 Gy
Session Number: 1

## 2023-09-18 ENCOUNTER — Ambulatory Visit: Payer: Medicare Other

## 2023-09-19 ENCOUNTER — Ambulatory Visit
Admission: RE | Admit: 2023-09-19 | Discharge: 2023-09-19 | Disposition: A | Discharge status: Home / Self Care | Payer: Medicare Other | Source: Ambulatory Visit | Attending: Radiation Oncology | Admitting: Radiation Oncology

## 2023-09-19 ENCOUNTER — Other Ambulatory Visit: Payer: Self-pay

## 2023-09-19 DIAGNOSIS — C3411 Malignant neoplasm of upper lobe, right bronchus or lung: Secondary | ICD-10-CM | POA: Diagnosis not present

## 2023-09-19 LAB — RAD ONC ARIA SESSION SUMMARY
Course Elapsed Days: 2
Plan Fractions Treated to Date: 2
Plan Prescribed Dose Per Fraction: 18 Gy
Plan Total Fractions Prescribed: 3
Plan Total Prescribed Dose: 54 Gy
Reference Point Dosage Given to Date: 36 Gy
Reference Point Session Dosage Given: 18 Gy
Session Number: 2

## 2023-09-22 ENCOUNTER — Other Ambulatory Visit: Payer: Self-pay

## 2023-09-22 ENCOUNTER — Ambulatory Visit
Admission: RE | Admit: 2023-09-22 | Discharge: 2023-09-22 | Disposition: A | Discharge status: Home / Self Care | Payer: Medicare Other | Source: Ambulatory Visit | Attending: Radiation Oncology | Admitting: Radiation Oncology

## 2023-09-22 ENCOUNTER — Ambulatory Visit
Admission: RE | Admit: 2023-09-22 | Discharge: 2023-09-22 | Disposition: A | Discharge status: Home / Self Care | Payer: Medicare Other | Source: Ambulatory Visit | Attending: Radiation Oncology

## 2023-09-22 DIAGNOSIS — C3411 Malignant neoplasm of upper lobe, right bronchus or lung: Secondary | ICD-10-CM | POA: Diagnosis not present

## 2023-09-22 LAB — RAD ONC ARIA SESSION SUMMARY
Course Elapsed Days: 5
Plan Fractions Treated to Date: 3
Plan Prescribed Dose Per Fraction: 18 Gy
Plan Total Fractions Prescribed: 3
Plan Total Prescribed Dose: 54 Gy
Reference Point Dosage Given to Date: 54 Gy
Reference Point Session Dosage Given: 18 Gy
Session Number: 3

## 2023-09-23 ENCOUNTER — Other Ambulatory Visit: Payer: Self-pay | Admitting: Urology

## 2023-09-23 ENCOUNTER — Telehealth: Payer: Self-pay

## 2023-09-23 ENCOUNTER — Other Ambulatory Visit: Payer: Self-pay

## 2023-09-23 ENCOUNTER — Ambulatory Visit: Payer: Medicare Other

## 2023-09-23 MED ORDER — HYDROCODONE-ACETAMINOPHEN 7.5-325 MG/15ML PO SOLN: 10.0000 mL | Freq: Four times a day (QID) | ORAL | 0 refills | Status: DC | PRN

## 2023-09-23 MED ORDER — SUCRALFATE 1 GM/10ML PO SUSP: 1.0000 g | Freq: Three times a day (TID) | ORAL | 0 refills | Status: DC

## 2023-09-23 NOTE — Telephone Encounter (Signed)
 RN called Trilby Drummer (daughter) Ms. Janssens per [employer] to inform her that the requested medication was sent to the [facility] pharmacy as requested.  No answer a voicemail was left on daughter's phone.

## 2023-09-23 NOTE — Radiation Completion Notes (Addendum)
  Radiation Oncology         [phone] ________________________________  Name: Claudia Wiley MRN: [id number]  Date: 09/23/2023  DOB: 09/20/1955  Referring Physician: Jenita Seashore, M.D. Date of Service: 2023-09-23 Radiation Oncologist: Margaretmary Bayley, M.D. [facility] [place]     RADIATION ONCOLOGY END OF TREATMENT NOTE     Diagnosis: C34.11 Malignant neoplasm of upper lobe, right bronchus or lung Staging on 2023-08-19: Malignant neoplasm of right upper lobe of lung (HCC) T=cT1a, N=cN0, M=cM0 Intent: Curative     ==========DELIVERED PLANS==========  First Treatment Date: 2023-09-17 Last Treatment Date: 2023-09-22   Plan Name: Lung_R_SBRT Site: Lung, Right Technique: SBRT/SRT-IMRT Mode: Photon Dose Per Fraction: 18 Gy Prescribed Dose (Delivered / Prescribed): 54 Gy / 54 Gy Prescribed Fxs (Delivered / Prescribed): 3 / 3     ==========ON TREATMENT VISIT DATES========== 2023-09-17, 2023-09-19, 2023-09-22, 2023-09-22   See weekly On Treatment Notes in Epic for details in the Media tab (listed as Progress notes on the On Treatment Visit Dates listed above).  She tolerated the treatments well with only mild esophagitis and modest fatigue.  The patient will receive a call in about one month from the radiation oncology department. She will continue routine follow up with surveillance CT chest scans every 3 to 6 months with her team at the [facility].  We are happy to see her back on an as-needed basis.  ------------------------------------------------   Margaretmary Dys, MD [facility]  Radiation Oncology Direct Dial: [phone]  Fax: [phone] .com  Skype  LinkedIn

## 2023-09-23 NOTE — Telephone Encounter (Signed)
 Ms. Ziolkowski completed SBRT to RUL 3 fractions yesterday 09/23/2023 was seen for EOT PUT visit.  Ms. Ander Slade daughter Placido Sou was present at visit she called this morning stating her mother has complaints of sore throat & nausea x 1 week, headache & body ache onset yesterday evening after visit.  She denies fever, chills, and vomiting.  RN asked her if it was possible that her mother may have the flu she stated not likely since they do not go out unless going to doctors appointments.  RN advised her to get a home flu test to rule out. Ms. Chou hasn't been eating well is on supplemental Ensure.  Per [name] she was heading to [facility] to pick up Ondansetron.   [name] is asking for new script for sucralfate suspension and Hycet solution if possible since she no longer has medications (both were on med list).  She would like it sent to [facility] in [place] (first) if you are unable to send there to send it to the [facility] on [address].  Ashlyn Bruning, PA-C made aware.

## 2023-09-24 ENCOUNTER — Ambulatory Visit: Payer: Medicare Other

## 2023-09-26 ENCOUNTER — Other Ambulatory Visit: Payer: Self-pay | Admitting: Urology

## 2023-09-26 ENCOUNTER — Ambulatory Visit: Payer: Medicare Other

## 2023-09-26 MED ORDER — HYDROCODONE-ACETAMINOPHEN 7.5-325 MG/15ML PO SOLN: 10.0000 mL | Freq: Four times a day (QID) | ORAL | 0 refills | Status: DC | PRN

## 2023-09-27 ENCOUNTER — Emergency Department (HOSPITAL_BASED_OUTPATIENT_CLINIC_OR_DEPARTMENT_OTHER)
Admission: EM | Admit: 2023-09-27 | Discharge: 2023-09-27 | Disposition: A | Discharge status: Home / Self Care | Attending: Emergency Medicine | Admitting: Emergency Medicine

## 2023-09-27 ENCOUNTER — Other Ambulatory Visit: Payer: Self-pay

## 2023-09-27 ENCOUNTER — Emergency Department (HOSPITAL_BASED_OUTPATIENT_CLINIC_OR_DEPARTMENT_OTHER)

## 2023-09-27 ENCOUNTER — Encounter (HOSPITAL_BASED_OUTPATIENT_CLINIC_OR_DEPARTMENT_OTHER): Payer: Self-pay | Admitting: Emergency Medicine

## 2023-09-27 DIAGNOSIS — R112 Nausea with vomiting, unspecified: Secondary | ICD-10-CM

## 2023-09-27 DIAGNOSIS — R109 Unspecified abdominal pain: Secondary | ICD-10-CM | POA: Insufficient documentation

## 2023-09-27 DIAGNOSIS — R519 Headache, unspecified: Secondary | ICD-10-CM | POA: Insufficient documentation

## 2023-09-27 LAB — COMPREHENSIVE METABOLIC PANEL
ALT: 5 U/L (ref 0–44)
AST: 13 U/L — ABNORMAL LOW (ref 15–41)
Albumin: 4.2 g/dL (ref 3.5–5.0)
Alkaline Phosphatase: 98 U/L (ref 38–126)
Anion gap: 8 (ref 5–15)
BUN: 11 mg/dL (ref 8–23)
CO2: 37 mmol/L — ABNORMAL HIGH (ref 22–32)
Calcium: 9.7 mg/dL (ref 8.9–10.3)
Chloride: 95 mmol/L — ABNORMAL LOW (ref 98–111)
Creatinine, Ser: 0.46 mg/dL (ref 0.44–1.00)
GFR, Estimated: >60 mL/min (ref >60)
Glucose, Bld: 126 mg/dL — ABNORMAL HIGH (ref 70–99)
Potassium: 3.6 mmol/L (ref 3.5–5.1)
Sodium: 140 mmol/L (ref 135–145)
Total Bilirubin: 0.4 mg/dL (ref 0.0–1.2)
Total Protein: 7.5 g/dL (ref 6.5–8.1)

## 2023-09-27 LAB — CBC WITH DIFFERENTIAL/PLATELET
Abs Immature Granulocytes: 0.02 10*3/uL (ref 0.00–0.07)
Basophils Absolute: 0 10*3/uL (ref 0.0–0.1)
Basophils Relative: 0 %
Eosinophils Absolute: 0.1 10*3/uL (ref 0.0–0.5)
Eosinophils Relative: 2 %
HCT: 41.7 % (ref 36.0–46.0)
Hemoglobin: 13.4 g/dL (ref 12.0–15.0)
Immature Granulocytes: 0 %
Lymphocytes Relative: 26 %
Lymphs Abs: 1.8 10*3/uL (ref 0.7–4.0)
MCH: 27.2 pg (ref 26.0–34.0)
MCHC: 32.1 g/dL (ref 30.0–36.0)
MCV: 84.6 fL (ref 80.0–100.0)
Monocytes Absolute: 0.9 10*3/uL (ref 0.1–1.0)
Monocytes Relative: 12 %
Neutro Abs: 4.2 10*3/uL (ref 1.7–7.7)
Neutrophils Relative %: 60 %
Platelets: 398 10*3/uL (ref 150–400)
RBC: 4.93 MIL/uL (ref 3.87–5.11)
RDW: 14.1 % (ref 11.5–15.5)
WBC: 7 10*3/uL (ref 4.0–10.5)
nRBC: 0 % (ref 0.0–0.2)

## 2023-09-27 LAB — LIPASE, BLOOD: Lipase: 12 U/L (ref 11–51)

## 2023-09-27 LAB — RESP PANEL BY RT-PCR (RSV, FLU A&B, COVID)  RVPGX2
Influenza A by PCR: NEGATIVE
Influenza B by PCR: NEGATIVE
Resp Syncytial Virus by PCR: NEGATIVE
SARS Coronavirus 2 by RT PCR: NEGATIVE

## 2023-09-27 MED ORDER — LACTATED RINGERS IV BOLUS
1000.0000 mL | Freq: Once | INTRAVENOUS | Status: AC
  Administered 2023-09-27: 1000 mL via INTRAVENOUS

## 2023-09-27 MED ORDER — IOHEXOL 300 MG/ML  SOLN
100.0000 mL | Freq: Once | INTRAMUSCULAR | Status: AC | PRN
  Administered 2023-09-27: 100 mL via INTRAVENOUS

## 2023-09-27 MED ORDER — ONDANSETRON HCL 4 MG/2ML IJ SOLN
4.0000 mg | Freq: Once | INTRAMUSCULAR | Status: AC
  Administered 2023-09-27: 4 mg via INTRAVENOUS
  Filled 2023-09-27: qty 2

## 2023-09-27 MED ORDER — ONDANSETRON HCL 4 MG PO TABS: 4.0000 mg | ORAL_TABLET | Freq: Four times a day (QID) | ORAL | 0 refills | Status: AC

## 2023-09-27 NOTE — ED Notes (Signed)
 Pt had some ginger-ale and some crackers and had no complaints after. Pt stated she is ready to go. Provider was made aware.

## 2023-09-27 NOTE — ED Triage Notes (Signed)
 Headache, stiff neck weakness and abdo pain Not eating of drinking much  Radiation on Monday ( last treatment)

## 2023-09-27 NOTE — ED Notes (Signed)
 Pt is on home oxygen [place] and remained on same during ED visit.

## 2023-09-27 NOTE — ED Provider Notes (Signed)
 [place] EMERGENCY DEPARTMENT AT [facility] Provider Note   CSN: [id number] Arrival date & time: 09/27/23  1506     History Chief Complaint  Patient presents with  . Headache    HPI Claudia Wiley is a 68 y.o. female presenting for a myriad of complaints.  She is endorsing nausea vomiting abdominal pain all present for 3 weeks.  Today she stated that she started feeling dehydrated and having a headache. Has been being worked up by her PCP and oncologist that she is currently undergoing radiation oncology in the outpatient setting. She denies fevers or chills, chest pain, shortness of breath or any other symptoms.   Patient's recorded medical, surgical, social, medication list and allergies were reviewed in the Snapshot window as part of the initial history.   Review of Systems   Review of Systems  Constitutional:  Negative for chills and fever.  HENT:  Negative for ear pain and sore throat.   Eyes:  Negative for pain and visual disturbance.  Respiratory:  Negative for cough and shortness of breath.   Cardiovascular:  Negative for chest pain and palpitations.  Gastrointestinal:  Positive for abdominal pain, nausea and vomiting.  Genitourinary:  Negative for dysuria and hematuria.  Musculoskeletal:  Negative for arthralgias and back pain.  Skin:  Negative for color change and rash.  Neurological:  Negative for seizures and syncope.  All other systems reviewed and are negative.   Physical Exam Updated Vital Signs BP (!) 134/98   Pulse (!) 127   Temp 99.6 F (37.6 C) (Oral)   SpO2 99%  Physical Exam Constitutional:      General: She is not in acute distress.    Appearance: She is not ill-appearing or toxic-appearing.  HENT:     Head: Normocephalic and atraumatic.  Eyes:     Extraocular Movements: Extraocular movements intact.     Pupils: Pupils are equal, round, and reactive to light.  Cardiovascular:     Rate and Rhythm: Normal rate.  Pulmonary:      Effort: No respiratory distress.  Abdominal:     General: Abdomen is flat.  Musculoskeletal:        General: No swelling, deformity or signs of injury.     Cervical back: Normal range of motion. No rigidity.  Skin:    General: Skin is warm and dry.  Neurological:     General: No focal deficit present.     Mental Status: She is alert and oriented to person, place, and time.  Psychiatric:        Mood and Affect: Mood normal.     ED Course/ Medical Decision Making/ A&P    Procedures Procedures   Medications Ordered in ED Medications  lactated ringers bolus 1,000 mL (1,000 mLs Intravenous New Bag/Given 09/27/23 1603)   Medical Decision Making:   Claudia Wiley is a 68 y.o. female who presented to the ED today with abdominal pain, detailed above.    Additional history discussed with patient's family/caregivers.  Patient placed on continuous vitals and telemetry monitoring while in ED which was reviewed periodically.  Complete initial physical exam performed, notably the patient  was hemodynamically stable no acute distress.     Reviewed and confirmed nursing documentation for past medical history, family history, social history.    Initial Assessment:   With the patient's presentation of abdominal pain, most likely diagnosis is nonspecific etiology. Other diagnoses were considered including (but not limited to) gastroenteritis, colitis, small bowel obstruction, appendicitis,  cholecystitis, pancreatitis, nephrolithiasis, UTI, pyleonephritis. These are considered less likely due to history of present illness and physical exam findings.   This is most consistent with an acute life/limb threatening illness complicated by underlying chronic conditions.   Initial Plan:  CBC/CMP to evaluate for underlying infectious/metabolic etiology for patient's abdominal pain  Lipase to evaluate for pancreatitis  EKG to evaluate for cardiac source of pain  CTAB/Pelvis with contrast to evaluate  for structural/surgical etiology of patients' severe abdominal pain.  CT head to evaluate for intracranial mass or concerning etiology of her headache.Is considered subacute in nature given that it came on over multiple days.  Urinalysis and repeat physical assessment to evaluate for UTI/Pyelonpehritis  Empiric management of symptoms with escalating pain control and antiemetics as needed.   Initial Study Results:   Laboratory  All laboratory results reviewed without evidence of clinically relevant pathology.    EKG EKG was reviewed independently. Rate, rhythm, axis, intervals all examined and without medically relevant abnormality. ST segments without concerns for elevations.    Radiology All images reviewed independently Agree with radiology report at this time.   CT ABDOMEN PELVIS W CONTRAST Result Date: 09/27/2023 CLINICAL DATA:  Abdominal pain EXAM: CT ABDOMEN AND PELVIS WITH CONTRAST TECHNIQUE: Multidetector CT imaging of the abdomen and pelvis was performed using the standard protocol following bolus administration of intravenous contrast. RADIATION DOSE REDUCTION: This exam was performed according to the departmental dose-optimization program which includes automated exposure control, adjustment of the mA and/or kV according to patient size and/or use of iterative reconstruction technique. CONTRAST:  OMNIPAQUE IOHEXOL 300 MG/ML  SOLN COMPARISON:  11/09/2020 FINDINGS: Lower chest: No acute abnormality Hepatobiliary: No focal hepatic abnormality. Gallbladder unremarkable. Pancreas: No focal abnormality or ductal dilatation. Spleen: No focal abnormality.  Normal size. Adrenals/Urinary Tract: No adrenal abnormality. No focal renal abnormality. No stones or hydronephrosis. Urinary bladder is unremarkable. Stomach/Bowel: Normal appendix. Stomach, large and small bowel grossly unremarkable. Vascular/Lymphatic: Aortic atherosclerosis. No evidence of aneurysm or adenopathy. Reproductive: Prior  hysterectomy.  No adnexal masses. Other: No free fluid or free air. Musculoskeletal: No acute bony abnormality. IMPRESSION: No acute findings in the abdomen or pelvis. Aortic atherosclerosis. Electronically Signed   By: Charlett Nose M.D.   On: 09/27/2023 17:15   CT HEAD WO CONTRAST ( ) Result Date: 09/27/2023 CLINICAL DATA:  Weakness. Loss of appetite. Neck stiffness. Headache. History of lung and stomach carcinoma. EXAM: CT HEAD WITHOUT CONTRAST TECHNIQUE: Contiguous axial images were obtained from the base of the skull through the vertex without intravenous contrast. RADIATION DOSE REDUCTION: This exam was performed according to the departmental dose-optimization program which includes automated exposure control, adjustment of the mA and/or kV according to patient size and/or use of iterative reconstruction technique. COMPARISON:  11/10/2020. FINDINGS: Brain: No evidence of acute infarction, hemorrhage, hydrocephalus, extra-axial collection or mass lesion/mass effect. Vascular: No hyperdense vessel or unexpected calcification. Skull: Normal. Negative for fracture or focal lesion. Sinuses/Orbits: Globes and orbits are unremarkable. Chronic wall thickening and mucosal thickening of the left sphenoid sinus. Remaining visualized sinuses are clear. Other: None. IMPRESSION: 1. No acute intracranial abnormalities. Electronically Signed   By: Amie Portland M.D.   On: 09/27/2023 16:04   DG Chest Portable 1 View Result Date: 09/27/2023 CLINICAL DATA:  Short of breath.  Weakness and loss of appetite. EXAM: PORTABLE CHEST 1 VIEW COMPARISON:  11/21/2022. FINDINGS: Cardiac silhouette is normal size.  No mediastinal or hilar masses. Lungs are hyperexpanded. Previous left lung surgery with pulmonary anastomosis  staples extending from the left hilar region towards the left apex, stable. No evidence of pneumonia or pulmonary edema. No pleural effusion or pneumothorax. Skeletal structures are grossly intact. IMPRESSION: 1.  No acute cardiopulmonary disease. 2. Previous left lung surgery.  COPD. Electronically Signed   By: Amie Portland M.D.   On: 09/27/2023 16:02    Final Reassessment and Plan:   After treatment with IV fluid and antiemetics, patient is ambulatory and tolerating p.o. intake. She feels comfortable with outpatient care management I was called back to bedside to discharge the patient. She states that she has been able to tolerate p.o. intake after Zofran and wants to use in the outpatient setting.  I feel a lot of her nausea and vomiting likely secondary to her ongoing cancer therapy versus a viral gastroenteritis. Considered C. difficile like likely given the involvement of nausea and frequency of diarrhea being less than it would be expected of C. difficile. Bacterial enteritis also considered but less likely for the same reasons as above. Show Medical Decision Making regarding observation in the hospital given her p.o. intolerance but patient has declined strongly preferring outpatient care. Disposition:  I have considered need for hospitalization, however, considering all of the above, I believe this patient is stable for discharge at this time.  Patient/family educated about specific return precautions for given chief complaint and symptoms.  Patient/family educated about follow-up with PCP.     Patient/family expressed understanding of return precautions and need for follow-up. Patient spoken to regarding all imaging and laboratory results and appropriate follow up for these results. All education provided in verbal form with additional information in written form. Time was allowed for answering of patient questions. Patient discharged.    Emergency Department Medication Summary:   Medications  lactated ringers bolus 1,000 mL (0 mLs Intravenous Stopped 09/27/23 1650)  iohexol (OMNIPAQUE) 300 MG/ML solution 100 mL (100 mLs Intravenous Contrast Given 09/27/23 1658)  ondansetron (ZOFRAN) injection 4  mg (4 mg Intravenous Given 09/27/23 1828)           Clinical Impression: No diagnosis found.   Data Unavailable   Final Clinical Impression(s) / ED Diagnoses Final diagnoses:  None    Rx / DC Orders ED Discharge Orders     None         Glyn Ade, MD 09/27/23 2236

## 2023-10-21 ENCOUNTER — Inpatient Hospital Stay
Admission: RE | Admit: 2023-10-21 | Discharge: 2023-10-21 | Disposition: A | Discharge status: Home / Self Care | Source: Ambulatory Visit

## 2023-10-21 NOTE — Progress Notes (Signed)
  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   Symptoms of fatigue have improved since completing therapy.  Symptoms of skin changes have improved since completing therapy.  Symptoms of esophagitis have not improved since completing therapy, dysphagia 6/10. Patient is on a soft diet. N/V has improved.  The patient has scheduled follow up with her medical oncologist Rad/Onc PRN for ongoing care, and was encouraged to call if she develops concerns or questions regarding radiation.   This concludes the interaction.  Ruel Favors, LPN

## 2023-10-22 ENCOUNTER — Other Ambulatory Visit: Payer: Self-pay | Admitting: Urology

## 2023-10-22 MED ORDER — HYDROCODONE-ACETAMINOPHEN 7.5-325 MG/15ML PO SOLN: 10.0000 mL | Freq: Four times a day (QID) | ORAL | 0 refills | Status: AC | PRN

## 2023-10-22 MED ORDER — SUCRALFATE 1 GM/10ML PO SUSP
1.0000 g | Freq: Three times a day (TID) | ORAL | 0 refills | Status: AC
Start: 2023-10-22 — End: ?

## 2023-10-29 NOTE — Progress Notes (Signed)
 Pts rad onc EOT note faxed to Abby Hocking, [facility] at this time.
